# Patient Record
Sex: Female | Born: 1935
Health system: Southern US, Community
[De-identification: ages and names within clinical notes are randomized; demographics above are authoritative.]

## PROBLEM LIST (undated history)

## (undated) ENCOUNTER — Emergency Department (HOSPITAL_COMMUNITY)

## (undated) DIAGNOSIS — T7840XA Allergy, unspecified, initial encounter: Secondary | ICD-10-CM

## (undated) DIAGNOSIS — R7303 Prediabetes: Secondary | ICD-10-CM

## (undated) DIAGNOSIS — M1990 Unspecified osteoarthritis, unspecified site: Secondary | ICD-10-CM

## (undated) DIAGNOSIS — I639 Cerebral infarction, unspecified: Secondary | ICD-10-CM

## (undated) DIAGNOSIS — N289 Disorder of kidney and ureter, unspecified: Secondary | ICD-10-CM

## (undated) DIAGNOSIS — C189 Malignant neoplasm of colon, unspecified: Secondary | ICD-10-CM

## (undated) DIAGNOSIS — Z9289 Personal history of other medical treatment: Secondary | ICD-10-CM

## (undated) DIAGNOSIS — I2699 Other pulmonary embolism without acute cor pulmonale: Secondary | ICD-10-CM

## (undated) DIAGNOSIS — E785 Hyperlipidemia, unspecified: Secondary | ICD-10-CM

## (undated) DIAGNOSIS — R42 Dizziness and giddiness: Secondary | ICD-10-CM

## (undated) DIAGNOSIS — E039 Hypothyroidism, unspecified: Secondary | ICD-10-CM

## (undated) DIAGNOSIS — Z85038 Personal history of other malignant neoplasm of large intestine: Secondary | ICD-10-CM

## (undated) DIAGNOSIS — I10 Essential (primary) hypertension: Secondary | ICD-10-CM

## (undated) HISTORY — PX: ABDOMINAL HYSTERECTOMY: SHX81

## (undated) HISTORY — DX: Personal history of other malignant neoplasm of large intestine: Z85.038

## (undated) HISTORY — PX: PARTIAL THYMECTOMY: SHX2177

## (undated) HISTORY — PX: PARTIAL HYSTERECTOMY: SHX80

## (undated) HISTORY — DX: Hyperlipidemia, unspecified: E78.5

## (undated) HISTORY — DX: Essential (primary) hypertension: I10

## (undated) HISTORY — DX: Unspecified osteoarthritis, unspecified site: M19.90

## (undated) HISTORY — PX: OTHER SURGICAL HISTORY: SHX169

## (undated) HISTORY — DX: Hypothyroidism, unspecified: E03.9

## (undated) HISTORY — DX: Cerebral infarction, unspecified: I63.9

## (undated) HISTORY — DX: Dizziness and giddiness: R42

## (undated) HISTORY — DX: Allergy, unspecified, initial encounter: T78.40XA

---

## 2000-09-06 ENCOUNTER — Ambulatory Visit (HOSPITAL_COMMUNITY): Admission: RE | Admit: 2000-09-06 | Discharge: 2000-09-06 | Payer: Self-pay | Admitting: Family Medicine

## 2000-09-06 ENCOUNTER — Encounter: Payer: Self-pay | Admitting: Family Medicine

## 2000-09-25 ENCOUNTER — Other Ambulatory Visit: Admission: RE | Admit: 2000-09-25 | Discharge: 2000-09-25 | Payer: Self-pay | Admitting: Family Medicine

## 2000-10-02 ENCOUNTER — Ambulatory Visit (HOSPITAL_COMMUNITY): Admission: RE | Admit: 2000-10-02 | Discharge: 2000-10-02 | Payer: Self-pay | Admitting: Family Medicine

## 2000-10-02 ENCOUNTER — Encounter: Payer: Self-pay | Admitting: Family Medicine

## 2001-01-18 ENCOUNTER — Encounter: Payer: Self-pay | Admitting: Family Medicine

## 2001-01-18 ENCOUNTER — Ambulatory Visit (HOSPITAL_COMMUNITY): Admission: RE | Admit: 2001-01-18 | Discharge: 2001-01-18 | Payer: Self-pay | Admitting: Family Medicine

## 2001-09-07 ENCOUNTER — Encounter: Payer: Self-pay | Admitting: Family Medicine

## 2001-09-07 ENCOUNTER — Ambulatory Visit (HOSPITAL_COMMUNITY): Admission: RE | Admit: 2001-09-07 | Discharge: 2001-09-07 | Payer: Self-pay | Admitting: Family Medicine

## 2002-09-13 ENCOUNTER — Ambulatory Visit (HOSPITAL_COMMUNITY): Admission: RE | Admit: 2002-09-13 | Discharge: 2002-09-13 | Payer: Self-pay | Admitting: Family Medicine

## 2002-09-13 ENCOUNTER — Encounter: Payer: Self-pay | Admitting: Family Medicine

## 2003-01-13 ENCOUNTER — Ambulatory Visit (HOSPITAL_COMMUNITY): Admission: RE | Admit: 2003-01-13 | Discharge: 2003-01-13 | Payer: Self-pay | Admitting: Internal Medicine

## 2003-10-07 ENCOUNTER — Ambulatory Visit (HOSPITAL_COMMUNITY): Admission: RE | Admit: 2003-10-07 | Discharge: 2003-10-07 | Payer: Self-pay | Admitting: Family Medicine

## 2004-10-12 ENCOUNTER — Ambulatory Visit (HOSPITAL_COMMUNITY): Admission: RE | Admit: 2004-10-12 | Discharge: 2004-10-12 | Payer: Self-pay | Admitting: Family Medicine

## 2005-10-14 ENCOUNTER — Ambulatory Visit (HOSPITAL_COMMUNITY): Admission: RE | Admit: 2005-10-14 | Discharge: 2005-10-14 | Payer: Self-pay | Admitting: Family Medicine

## 2006-10-17 ENCOUNTER — Ambulatory Visit (HOSPITAL_COMMUNITY): Admission: RE | Admit: 2006-10-17 | Discharge: 2006-10-17 | Payer: Self-pay | Admitting: Family Medicine

## 2007-03-01 ENCOUNTER — Ambulatory Visit (HOSPITAL_COMMUNITY): Admission: RE | Admit: 2007-03-01 | Discharge: 2007-03-01 | Payer: Self-pay | Admitting: Family Medicine

## 2007-10-19 ENCOUNTER — Ambulatory Visit (HOSPITAL_COMMUNITY): Admission: RE | Admit: 2007-10-19 | Discharge: 2007-10-19 | Payer: Self-pay | Admitting: Family Medicine

## 2008-10-28 ENCOUNTER — Ambulatory Visit (HOSPITAL_COMMUNITY): Admission: RE | Admit: 2008-10-28 | Discharge: 2008-10-28 | Payer: Self-pay | Admitting: Family Medicine

## 2009-06-10 ENCOUNTER — Ambulatory Visit: Payer: Self-pay | Admitting: Gastroenterology

## 2009-06-10 ENCOUNTER — Encounter (INDEPENDENT_AMBULATORY_CARE_PROVIDER_SITE_OTHER): Payer: Self-pay

## 2009-06-10 DIAGNOSIS — K5900 Constipation, unspecified: Secondary | ICD-10-CM | POA: Insufficient documentation

## 2009-06-12 DIAGNOSIS — Z85038 Personal history of other malignant neoplasm of large intestine: Secondary | ICD-10-CM

## 2009-06-12 HISTORY — DX: Personal history of other malignant neoplasm of large intestine: Z85.038

## 2009-06-12 HISTORY — PX: COLONOSCOPY: SHX174

## 2009-06-29 ENCOUNTER — Ambulatory Visit: Payer: Self-pay | Admitting: Gastroenterology

## 2009-06-29 ENCOUNTER — Ambulatory Visit (HOSPITAL_COMMUNITY): Admission: RE | Admit: 2009-06-29 | Discharge: 2009-06-29 | Payer: Self-pay | Admitting: Gastroenterology

## 2009-06-30 ENCOUNTER — Encounter: Payer: Self-pay | Admitting: Gastroenterology

## 2009-07-01 ENCOUNTER — Encounter: Payer: Self-pay | Admitting: Gastroenterology

## 2009-07-01 ENCOUNTER — Ambulatory Visit (HOSPITAL_COMMUNITY): Admission: RE | Admit: 2009-07-01 | Discharge: 2009-07-01 | Payer: Self-pay | Admitting: Gastroenterology

## 2009-07-01 LAB — CONVERTED CEMR LAB
ALT: 9 units/L (ref 0–35)
AST: 13 units/L (ref 0–37)
Albumin: 4.3 g/dL (ref 3.5–5.2)
BUN: 18 mg/dL (ref 6–23)
Basophils Absolute: 0 10*3/uL (ref 0.0–0.1)
Basophils Relative: 0 % (ref 0–1)
CEA: 0.6 ng/mL (ref 0.0–5.0)
Calcium: 9.5 mg/dL (ref 8.4–10.5)
Eosinophils Relative: 1 % (ref 0–5)
Glucose, Bld: 105 mg/dL — ABNORMAL HIGH (ref 70–99)
HCT: 43.7 % (ref 36.0–46.0)
INR: 1.12 (ref <1.50)
MCV: 93.2 fL (ref 78.0–100.0)
Monocytes Absolute: 0.7 10*3/uL (ref 0.1–1.0)
Platelets: 369 10*3/uL (ref 150–400)
Potassium: 3.8 meq/L (ref 3.5–5.3)
Prothrombin Time: 14.3 s (ref 11.6–15.2)
Sodium: 140 meq/L (ref 135–145)

## 2009-07-02 ENCOUNTER — Encounter: Payer: Self-pay | Admitting: Gastroenterology

## 2009-07-12 HISTORY — PX: COLON SURGERY: SHX602

## 2009-07-31 ENCOUNTER — Telehealth (INDEPENDENT_AMBULATORY_CARE_PROVIDER_SITE_OTHER): Payer: Self-pay

## 2009-07-31 ENCOUNTER — Encounter (INDEPENDENT_AMBULATORY_CARE_PROVIDER_SITE_OTHER): Payer: Self-pay | Admitting: General Surgery

## 2009-07-31 ENCOUNTER — Inpatient Hospital Stay (HOSPITAL_COMMUNITY): Admission: RE | Admit: 2009-07-31 | Discharge: 2009-08-05 | Payer: Self-pay | Admitting: General Surgery

## 2009-08-20 ENCOUNTER — Ambulatory Visit: Payer: Self-pay | Admitting: Hematology and Oncology

## 2009-08-26 LAB — CBC WITH DIFFERENTIAL/PLATELET
BASO%: 0.4 % (ref 0.0–2.0)
Basophils Absolute: 0 10*3/uL (ref 0.0–0.1)
EOS%: 2.8 % (ref 0.0–7.0)
HCT: 39.1 % (ref 34.8–46.6)
MCH: 30.1 pg (ref 25.1–34.0)
MCHC: 32.7 g/dL (ref 31.5–36.0)
NEUT#: 2.6 10*3/uL (ref 1.5–6.5)
NEUT%: 53.2 % (ref 38.4–76.8)
WBC: 4.9 10*3/uL (ref 3.9–10.3)

## 2009-08-26 LAB — COMPREHENSIVE METABOLIC PANEL
Albumin: 4.3 g/dL (ref 3.5–5.2)
Alkaline Phosphatase: 68 U/L (ref 39–117)
Calcium: 9.6 mg/dL (ref 8.4–10.5)
Chloride: 104 mEq/L (ref 96–112)
Glucose, Bld: 100 mg/dL — ABNORMAL HIGH (ref 70–99)
Sodium: 142 mEq/L (ref 135–145)
Total Bilirubin: 0.6 mg/dL (ref 0.3–1.2)

## 2009-08-26 LAB — CEA: CEA: 0.8 ng/mL (ref 0.0–5.0)

## 2009-09-08 ENCOUNTER — Ambulatory Visit (HOSPITAL_COMMUNITY): Admission: RE | Admit: 2009-09-08 | Discharge: 2009-09-08 | Payer: Self-pay | Admitting: Hematology and Oncology

## 2009-10-26 ENCOUNTER — Encounter: Payer: Self-pay | Admitting: Gastroenterology

## 2009-10-30 ENCOUNTER — Ambulatory Visit (HOSPITAL_COMMUNITY): Admission: RE | Admit: 2009-10-30 | Discharge: 2009-10-30 | Payer: Self-pay | Admitting: Family Medicine

## 2010-02-18 ENCOUNTER — Ambulatory Visit: Payer: Self-pay | Admitting: Hematology and Oncology

## 2010-02-22 LAB — COMPREHENSIVE METABOLIC PANEL
ALT: 9 U/L (ref 0–35)
AST: 14 U/L (ref 0–37)
BUN: 12 mg/dL (ref 6–23)
Calcium: 9.9 mg/dL (ref 8.4–10.5)
Creatinine, Ser: 1.04 mg/dL (ref 0.40–1.20)
Glucose, Bld: 86 mg/dL (ref 70–99)
Total Bilirubin: 0.6 mg/dL (ref 0.3–1.2)

## 2010-02-22 LAB — CEA: CEA: <0.5 ng/mL (ref 0.0–5.0)

## 2010-02-22 LAB — CBC WITH DIFFERENTIAL/PLATELET
Basophils Absolute: 0 10*3/uL (ref 0.0–0.1)
EOS%: 2.1 % (ref 0.0–7.0)
Eosinophils Absolute: 0.1 10*3/uL (ref 0.0–0.5)
HCT: 42.1 % (ref 34.8–46.6)
HGB: 14 g/dL (ref 11.6–15.9)
MCV: 91.3 fL (ref 79.5–101.0)
MONO#: 0.5 10*3/uL (ref 0.1–0.9)
MONO%: 6.9 % (ref 0.0–14.0)
NEUT%: 48.2 % (ref 38.4–76.8)
Platelets: 379 10*3/uL (ref 145–400)
RBC: 4.61 10*6/uL (ref 3.70–5.45)
RDW: 15.1 % — ABNORMAL HIGH (ref 11.2–14.5)
lymph#: 2.8 10*3/uL (ref 0.9–3.3)

## 2010-03-22 ENCOUNTER — Encounter: Payer: Self-pay | Admitting: Internal Medicine

## 2010-04-03 ENCOUNTER — Other Ambulatory Visit: Payer: Self-pay | Admitting: Hematology and Oncology

## 2010-04-03 DIAGNOSIS — C189 Malignant neoplasm of colon, unspecified: Secondary | ICD-10-CM

## 2010-04-15 NOTE — Miscellaneous (Signed)
Summary: Colonoscopy  01/13/2003    Clinical Lists Changes    NAME:  [name], [name]                  ACCOUNT NO.:  [id number]   MEDICAL RECORD NO.:  [id number]                   PATIENT TYPE:  AMB   LOCATION:  DAY                                  FACILITY:  [facility]   PHYSICIAN:  Lionel December, M.D.                 DATE OF BIRTH:  06/18/1934   DATE OF PROCEDURE:  01/13/2003  DATE OF DISCHARGE:                                 OPERATIVE REPORT   PROCEDURE:  Total colonoscopy.   INDICATIONS:  [name] is a 76 year old African-American female who is  undergoing screening colonoscopy. Family history is positive for colon  carcinoma in her mother.   The procedure and risks were reviewed with the patient and informed consent  was obtained.   PREMEDICATIONS:  Demerol 50 mg IV, Versed 6 mg IV in divided doses.  tem.   FINDINGS:  The procedure performed in endoscopy suite.  The patient's vital  signs and O2 saturations were monitored during the procedure and remained  stable.  The patient was placed in the left lateral position and rectal  examination was performed.  No abnormalities were noted on external or  digital exam.  The Olympus video scope was placed in the rectum and advanced  under direct vision into the sigmoid colon and beyond.  The preparation was  excellent.  The scope was passed into the cecum which was identified by the  appendiceal orifice and ileocecal valve.  Pictures were taken for the  record.  As the scope was withdrawn, the colonic mucosa was once again  carefully examined and was normal throughout.  The rectal mucosa similarly  was normal.  The scope was retroflexed to examine the anorectal junction  which was unremarkable.  The endoscope was straightened and withdrawn.  The  patient tolerated the procedure well.   FINAL DIAGNOSES:  Normal colonoscopy.   RECOMMENDATIONS:  Yearly Hemoccults and she should return for next screening  exam in five years from  now.      ___________________________________________                                            Lionel December, M.D.   NR/MEDQ  D:  01/13/2003  T:  01/13/2003  Job:  [id number]   cc:   Angus G. Renard Matter, M.D.  [address]  [place]  [place] [address]  Fax: [phone]

## 2010-04-15 NOTE — Progress Notes (Signed)
Summary: REQUEST FROM LAB/NEED DIFFERENT DX CODE  Phone Note Other Incoming   Summary of Call: VM FROM [facility] AT [facility]. PT HAD CEA AND PT ON 06/30/09 AND A DIFFENENT DIAGNOSIS  CODE IS NEEDED. Initial call taken by: Cloria Spring LPN,  Jul 31, 2009 2:10 PM     Appended Document: REQUEST FROM LAB/NEED DIFFERENT DX CODE Dx: Colon Cancer  Appended Document: REQUEST FROM LAB/NEED DIFFERENT DX CODE LEft the above information on VM for [facility].

## 2010-04-15 NOTE — Letter (Signed)
Summary: CT SCAN ORDER  CT SCAN ORDER   Imported By: Ave Filter 06/30/2009 16:44:51  _____________________________________________________________________  External Attachment:    Type:   Image     Comment:   External Document

## 2010-04-15 NOTE — Miscellaneous (Signed)
Summary: Orders Update  Clinical Lists Changes  Orders: Added new Test order of T-Comprehensive Metabolic Panel [phone]) - Signed Added new Test order of T-CBC w/Diff ([id number]) - Signed Added new Test order of T-PT (Prothrombin Time) ([id number]) - Signed Added new Test order of T-CEA ([id number]) - Signed

## 2010-04-15 NOTE — Letter (Signed)
Summary: TCS ORDER  TCS ORDER   Imported By: Ave Filter 06/10/2009 14:12:03  _____________________________________________________________________  External Attachment:    Type:   Image     Comment:   External Document

## 2010-04-15 NOTE — Assessment & Plan Note (Signed)
Summary: BLOOD IN STOOL ,GU   Visit Type:  Initial Visit Primary Care Provider:  McInnis  Chief Complaint:  blood in stool.  History of Present Illness: [name] is a plesant 76 y/o female, who presents for further evaluation of brbpr. She had single episode noted with straining recently. She has intermittent constipation ususally controlled with high fiber diet. Her last TCS was by Dr. Karilyn Cota in 11/04 and was normal. She has FH of CRC, mother. She denies abdominal pain, melena, diarrhea, heartburn, n/v, weight loss. She has gained a few pounds this winter. She is excercizing.     Current Medications (verified): 1)  Synthroid 75 Mcg Tabs (Levothyroxine Sodium) .... Take 1 Tablet By Mouth Once A Day 2)  Crestor 20 Mg Tabs (Rosuvastatin Calcium) .... Take 1 Tablet By Mouth Once A Day 3)  Amlodipine Besylate 5 Mg Tabs (Amlodipine Besylate) .... Take 1 Tablet By Mouth Once A Day 4)  Evista 60 Mg Tabs (Raloxifene Hcl) .... Take 1 Tablet By Mouth Once A Day 5)  Tylenol .... As Needed  Allergies (verified): No Known Drug Allergies  Past History:  Past Medical History: Colonoscopy, 01/2003 --> normal Hyperlipidemia Hypertension Hypothyroidism  Past Surgical History: Partial thyroidectomy, benign tumors Partial hysterectomy  Family History: Mother, colon cancer, greater than age 79s, heart dz Father, Deceased, MI No FH liver disease  Social History: Married. Two children. 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 Used to work in Designer, fashion/clothing. Never smoked. No alcohol or drug use.  Review of Systems General:  Denies fever, chills, sweats, anorexia, fatigue, weakness, and weight loss. Eyes:  Denies vision loss. ENT:  Denies nasal congestion, sore throat, hoarseness, and difficulty swallowing. CV:  Denies chest pains, angina, and palpitations. Resp:  Denies dyspnea at rest, dyspnea with exercise, cough, and sputum. GI:  See HPI. GU:  Denies urinary burning and blood in urine. MS:  Denies joint  pain / LOM. Derm:  Denies rash and itching. Neuro:  Denies weakness, paralysis, frequent headaches, memory loss, and confusion. Psych:  Denies depression and anxiety. Endo:  Denies unusual weight change. Heme:  Denies bruising and bleeding. Allergy:  Denies hives and rash.  Vital Signs:  Patient profile:   76 year old female Height:      64 inches Weight:      177 pounds BMI:     30.49 Temp:     98.4 degrees F oral Pulse rate:   96 / minute BP sitting:   142 / 80  (left arm) Cuff size:   large  Vitals Entered By: Cloria Spring LPN (June 10, 2009 1:23 PM)  Physical Exam  General:  Well developed, well nourished, no acute distress. Head:  Normocephalic and atraumatic. Eyes:  Conjunctivae pink, no scleral icterus.  Mouth:  Oropharyngeal mucosa moist, pink.  No lesions, erythema or exudate.    Neck:  Supple; no masses or thyromegaly. Lungs:  Clear throughout to auscultation. Heart:  Regular rate and rhythm; no murmurs, rubs,  or bruits. Abdomen:  Bowel sounds normal.  Abdomen is soft, nontender, nondistended.  No rebound or guarding.  No hepatosplenomegaly, masses or hernias.  No abdominal bruits.  Rectal:  deferred until time of colonoscopy.   Extremities:  No clubbing, cyanosis, edema or deformities noted. Neurologic:  Alert and  oriented x4;  grossly normal neurologically. Skin:  Intact without significant lesions or rashes. Cervical Nodes:  No significant cervical adenopathy. Psych:  Alert and cooperative. Normal mood and affect.  Impression & Recommendations:  Problem # 1:  RECTAL BLEEDING (ICD-569.3)  Brbpr with recent constipation. Likely benign anorectal source. Patient is overdue for colonoscopy (FH of CRC 1st degree relative). Colonoscopy to be performed in near future.  Risks, alternatives, and benefits including but not limited to the risk of reaction to medication, bleeding, infection, and perforation were addressed.  Patient voiced understanding and provided  verbal consent.   For constipation, she will continue high fiber diet. Drink plenty of fluids. May add Colace or Miralax daily as needed.  Orders: New Patient Level III [phone])

## 2010-04-15 NOTE — Letter (Signed)
Summary: OFFICE NOTE FROM DR ODOGWU  OFFICE NOTE FROM DR ODOGWU   Imported By: Rexene Alberts 10/26/2009 14:04:44  _____________________________________________________________________  External Attachment:    Type:   Image     Comment:   External Document

## 2010-04-15 NOTE — Letter (Signed)
Summary: OFFICE PROGRESS NOTE  OFFICE PROGRESS NOTE   Imported By: Rexene Alberts 03/22/2010 10:42:49  _____________________________________________________________________  External Attachment:    Type:   Image     Comment:   External Document

## 2010-04-15 NOTE — Letter (Signed)
Summary: SURGICAL REFERRAL  SURGICAL REFERRAL   Imported By: Ave Filter 07/02/2009 15:32:25  _____________________________________________________________________  External Attachment:    Type:   Image     Comment:   External Document

## 2010-05-31 LAB — CBC
Hemoglobin: 11.6 g/dL — ABNORMAL LOW (ref 12.0–15.0)
Hemoglobin: 14.3 g/dL (ref 12.0–15.0)
MCHC: 33.3 g/dL (ref 30.0–36.0)
MCV: 94.7 fL (ref 78.0–100.0)
Platelets: 254 10*3/uL (ref 150–400)
Platelets: 329 10*3/uL (ref 150–400)
RDW: 14.8 % (ref 11.5–15.5)

## 2010-05-31 LAB — DIFFERENTIAL
Basophils Relative: 0 % (ref 0–1)
Eosinophils Relative: 3 % (ref 0–5)
Lymphs Abs: 1.5 10*3/uL (ref 0.7–4.0)
Monocytes Relative: 2 % — ABNORMAL LOW (ref 3–12)
Neutro Abs: 4 10*3/uL (ref 1.7–7.7)
Neutrophils Relative %: 69 % (ref 43–77)

## 2010-05-31 LAB — BASIC METABOLIC PANEL
BUN: 8 mg/dL (ref 6–23)
BUN: 9 mg/dL (ref 6–23)
CO2: 28 mEq/L (ref 19–32)
Chloride: 106 mEq/L (ref 96–112)
Creatinine, Ser: 1.13 mg/dL (ref 0.4–1.2)
GFR calc non Af Amer: 47 mL/min — ABNORMAL LOW (ref >60)
GFR calc non Af Amer: 48 mL/min — ABNORMAL LOW (ref >60)
Glucose, Bld: 112 mg/dL — ABNORMAL HIGH (ref 70–99)
Potassium: 4.4 mEq/L (ref 3.5–5.1)
Sodium: 140 mEq/L (ref 135–145)

## 2010-06-29 ENCOUNTER — Encounter: Payer: Self-pay | Admitting: Internal Medicine

## 2010-06-29 ENCOUNTER — Ambulatory Visit (INDEPENDENT_AMBULATORY_CARE_PROVIDER_SITE_OTHER): Payer: Medicare Other | Admitting: Gastroenterology

## 2010-06-29 ENCOUNTER — Encounter: Payer: Self-pay | Admitting: Gastroenterology

## 2010-06-29 VITALS — BP 157/86 | HR 87 | Temp 97.5°F | Ht 64.0 in | Wt 165.4 lb

## 2010-06-29 DIAGNOSIS — K5900 Constipation, unspecified: Secondary | ICD-10-CM

## 2010-06-29 DIAGNOSIS — Z85038 Personal history of other malignant neoplasm of large intestine: Secondary | ICD-10-CM

## 2010-06-29 MED ORDER — PEG 3350-KCL-NA BICARB-NACL 420 G PO SOLR: ORAL | Status: AC

## 2010-06-29 NOTE — Assessment & Plan Note (Signed)
Adenocarcinoma of the colon, diagnosed in April of 2011. She is doing very well. She is due for her one-year followup surveillance colonoscopy. She recalls waking up at time of her colonoscopy last time and requests more sedation. She received Versed 4 mg, Demerol 50 mg last time. Suspect with increased dose she will have an adequate sedation and will not need to have deep sedation in the OR. I have discussed the risks, alternatives, benefits with regards to but not limited to the risk of reaction to medication, bleeding, infection, perforation and the patient is agreeable to proceed. Written consent to be obtained.

## 2010-06-29 NOTE — Progress Notes (Signed)
    BM doing okay, Miralax prn. No melena, brbpr. No abd pain. Good appetite. Gained some weight back. No heartburn, n/v. Goes back to see oncologist in six months.

## 2010-06-29 NOTE — Assessment & Plan Note (Signed)
Continue MiraLax when necessary care

## 2010-06-29 NOTE — Progress Notes (Signed)
Primary Care Physician:  Alice Reichert, MD  Primary Gastroenterologist:  Dr. Jonette Eva  Chief Complaint  Patient presents with  . Pre-op Exam    consult for repeat colonoscopy, found cancer last year during procedure    HPI:  [name] is a 76 y.o. female here for followup of colon cancer. She was diagnosed with colon cancer in April of 2011 at time of colonoscopy. She was found to have a 1.2 cm sessile cecal polyp which had adenocarcinoma arising in a tubular adenoma. She subsequently underwent a right hemicolectomy by Dr. Bertram Savin in May 2011. There was no evidence of residual adenocarcinoma on the pathology. Patient did not require adjuvant therapy. She has done really well. Her bowel movements are regular. She has to use MiraLax only as needed. Denies any melena, rectal bleeding, abdominal pain, heartburn, vomiting. She lost about 20 pounds around the time of her surgery but has gained 10 pounds back.  Current Outpatient Prescriptions  Medication Sig Dispense Refill  . amLODipine (NORVASC) 5 MG tablet Take 1 tablet by mouth daily.      . raloxifene (EVISTA) 60 MG tablet Take 60 mg by mouth daily.        . rosuvastatin (CRESTOR) 20 MG tablet Take 20 mg by mouth daily.        Marland Kitchen SYNTHROID 75 MCG tablet Take 1 tablet by mouth daily.      Wallene Dales ophthalmic ointment Apply 1 application topically Twice daily.      . polyethylene glycol-electrolytes (TRILYTE) 420 G solution Use as directed Also buy 1 fleet enema & 4 dulcolax tablets to use as directed  4000 mL  0  . RESTASIS 0.05 % ophthalmic emulsion Place 1 drop into both eyes 2 (two) times daily.         Allergies as of 06/29/2010  . (No Known Allergies)    Past Medical History  Diagnosis Date  . Hyperlipidemia   . HTN (hypertension)   . Hypothyroidism   . History of colon cancer 06/2009    found at time of TCS 06/29/09, 1.2cm sessile cecal polyp, no adjuvent therapy needed    Past Surgical History    Procedure Date  . Colon surgery 07/2009    right hemicolectomy, no residual colon cancer on path  . Partial thyroidectomy     benign tumors  . Partial hysterectomy     Family History  Problem Relation Age of Onset  . Colon cancer Mother     >age60  . Heart attack Father   . Liver disease Neg Hx     History   Social History  . Marital Status: Married    Spouse Name: N/A    Number of Children: 2  . Years of Education: N/A   Occupational History  . volunteer at [employer]   . retired from Designer, fashion/clothing    Social History Main Topics  . Smoking status: Never Smoker   . Smokeless tobacco: Never Used  . Alcohol Use: No  . Drug Use: No  . Sexually Active: No      ROS:  General: Negative for anorexia, weight loss, fever, chills, fatigue, weakness. Eyes: Negative for vision changes.  ENT: Negative for hoarseness, difficulty swallowing , nasal congestion. CV: Negative for chest pain, angina, palpitations, dyspnea on exertion, peripheral edema.  Respiratory: Negative for dyspnea at rest, dyspnea on exertion, cough, sputum, wheezing.  GI: See history of present illness. GU:  Negative for dysuria, hematuria, urinary incontinence, urinary frequency, nocturnal  urination.  MS: Negative for joint pain, low back pain.  Derm: Negative for rash or itching.  Neuro: Negative for weakness, abnormal sensation, seizure, frequent headaches, memory loss, confusion.  Psych: Negative for anxiety, depression, suicidal ideation, hallucinations.  Endo: Negative for unusual weight change.  Heme: Negative for bruising or bleeding. Allergy: Negative for rash or hives.    Physical Examination:  BP 157/86  Pulse 87  Temp 97.5 F (36.4 C)  Ht 5\' 4"  (1.626 m)  Wt 165 lb 6.4 oz (75.025 kg)  BMI 28.39 kg/m2   General: Well-nourished, well-developed in no acute distress.  Head: Normocephalic, atraumatic.   Eyes: Conjunctiva pink, no icterus. Mouth: Oropharyngeal mucosa moist and pink , no lesions  erythema or exudate. Neck: Supple without thyromegaly, masses, or lymphadenopathy.  Lungs: Clear to auscultation bilaterally.  Heart: Regular rate and rhythm, no murmurs rubs or gallops.  Abdomen: Bowel sounds are normal, nontender, nondistended, no hepatosplenomegaly or masses, no abdominal bruits or    hernia , no rebound or guarding.   Extremities: No lower extremity edema.  Neuro: Alert and oriented x 4 , grossly normal neurologically.  Skin: Warm and dry, no rash or jaundice.   Psych: Alert and cooperative, normal mood and affect.

## 2010-07-16 ENCOUNTER — Ambulatory Visit (HOSPITAL_COMMUNITY)
Admission: RE | Admit: 2010-07-16 | Discharge: 2010-07-16 | Disposition: A | Discharge status: Home / Self Care | Payer: Medicare Other | Source: Ambulatory Visit | Attending: Gastroenterology | Admitting: Gastroenterology

## 2010-07-16 ENCOUNTER — Encounter: Payer: Medicare Other | Admitting: Gastroenterology

## 2010-07-16 ENCOUNTER — Other Ambulatory Visit: Payer: Self-pay | Admitting: Gastroenterology

## 2010-07-16 DIAGNOSIS — D128 Benign neoplasm of rectum: Secondary | ICD-10-CM | POA: Insufficient documentation

## 2010-07-16 DIAGNOSIS — K5730 Diverticulosis of large intestine without perforation or abscess without bleeding: Secondary | ICD-10-CM | POA: Insufficient documentation

## 2010-07-16 DIAGNOSIS — Z09 Encounter for follow-up examination after completed treatment for conditions other than malignant neoplasm: Secondary | ICD-10-CM

## 2010-07-16 DIAGNOSIS — Z79899 Other long term (current) drug therapy: Secondary | ICD-10-CM | POA: Insufficient documentation

## 2010-07-16 DIAGNOSIS — D126 Benign neoplasm of colon, unspecified: Secondary | ICD-10-CM

## 2010-07-16 DIAGNOSIS — Z85048 Personal history of other malignant neoplasm of rectum, rectosigmoid junction, and anus: Secondary | ICD-10-CM

## 2010-07-16 DIAGNOSIS — D129 Benign neoplasm of anus and anal canal: Secondary | ICD-10-CM

## 2010-07-16 DIAGNOSIS — I10 Essential (primary) hypertension: Secondary | ICD-10-CM | POA: Insufficient documentation

## 2010-07-16 HISTORY — PX: COLONOSCOPY: SHX174

## 2010-07-30 NOTE — Op Note (Signed)
   NAME:  [name], [name]                  ACCOUNT NO.:  [id number]   MEDICAL RECORD NO.:  [id number]                   PATIENT TYPE:  AMB   LOCATION:  DAY                                  FACILITY:  [facility]   PHYSICIAN:  Lionel December, M.D.                 DATE OF BIRTH:  1934/07/14   DATE OF PROCEDURE:  01/13/2003  DATE OF DISCHARGE:                                 OPERATIVE REPORT   PROCEDURE:  Total colonoscopy.   INDICATIONS:  Quanna is a 76 year old African-American female who is  undergoing screening colonoscopy. Family history is positive for colon  carcinoma in her mother.   The procedure and risks were reviewed with the patient and informed consent  was obtained.   PREMEDICATIONS:  Demerol 50 mg IV, Versed 6 mg IV in divided doses.  tem.   FINDINGS:  The procedure performed in endoscopy suite.  The patient's vital  signs and O2 saturations were monitored during the procedure and remained  stable.  The patient was placed in the left lateral position and rectal  examination was performed.  No abnormalities were noted on external or  digital exam.  The Olympus video scope was placed in the rectum and advanced  under direct vision into the sigmoid colon and beyond.  The preparation was  excellent.  The scope was passed into the cecum which was identified by the  appendiceal orifice and ileocecal valve.  Pictures were taken for the  record.  As the scope was withdrawn, the colonic mucosa was once again  carefully examined and was normal throughout.  The rectal mucosa similarly  was normal.  The scope was retroflexed to examine the anorectal junction  which was unremarkable.  The endoscope was straightened and withdrawn.  The  patient tolerated the procedure well.   FINAL DIAGNOSES:  Normal colonoscopy.   RECOMMENDATIONS:  Yearly Hemoccults and she should return for next screening  exam in five years from now.      ___________________________________________                                        Lionel December, M.D.   NR/MEDQ  D:  01/13/2003  T:  01/13/2003  Job:  [id number]   cc:   Angus G. Renard Matter, M.D.  [address]  [place]  [place] [address]  Fax: [phone]

## 2010-08-10 NOTE — Op Note (Signed)
  NAMEWINDA, Nicole        ACCOUNT NO.:  [id number]  MEDICAL RECORD NO.:  [id number]           PATIENT TYPE:  O  LOCATION:  DAYP                          FACILITY:  [facility]  PHYSICIAN:  Jonette Eva, M.D.     DATE OF BIRTH:  [birth year]  DATE OF PROCEDURE:  07/16/2010 DATE OF DISCHARGE:                              OPERATIVE REPORT   REFERRING PHYSICIAN:  Angus G. McInnis, MD  PROCEDURE:  Colonoscopy with cold forceps and polypectomy.  INDICATION FOR EXAM:  [name] is a 76 year old female who presented in April 2011 with change in her bowel habits and bright red blood per rectum.  She was found to have a 1.2-cm sessile cecal polyp which was removed piecemeal.  She also had 2-4 rectal polyps removed. The pathology revealed adenocarcinoma arising in a tubular adenoma and she had several hyperplastic rectal polyps.  In May 2011, she had a right hemicolectomy.  The pathology showed no residual adenocarcinoma and 25 benign pericolonic lymph nodes.  The margins tumors were also negative for tumor.  She denies any diarrhea, bright red blood per rectum, change in bowel habits, or weight loss.  FINDINGS: 1. Normal neoterminal ileum. 2. Normal anastomosis. 3. Two 3-mm sessile polypoid-appearing lesions removed via cold     forceps in the descending colon.  Two 3-mm sessile rectal polyps     removed via cold forceps. 4. Frequent sigmoid colon diverticula.  Otherwise, no masses,     inflammatory changes, or AVMs seen. 5. Normal retroflex view of the rectum.  RECOMMENDATIONS: 1. Screening colonoscopy in 3 years. 2. Will call with results of her polypectomy. 3. No aspirin, NSAIDS, or anticoagulation for 3 days. 4. She should follow a high-fiber diet. 5. She is given a handout on high-fiber diet, polyps, and     diverticulosis.  MEDICATIONS: 1. Demerol 75 mg IV. 2. Versed 5 mg IV.  PROCEDURE TECHNIQUE:  Physical exam was performed.  Informed consent was obtained  from the patient explaining the benefits, risks, and alternatives to the procedure.  The patient was connected to monitor and placed in left lateral position.  Continuous oxygen was provided by nasal cannula and IV medicine administered through an indwelling cannula.  After ministration of sedation and rectal exam, the patient's rectum was intubated and scope advanced under direct visualization to the neoterminal ileum.  Scope was removed slowly by careful examining the color, texture, anatomy, and integrity of mucosa on the way out.  The patient was recovered in Endoscopy and discharged home in satisfactory condition.  PATH: HYPERPLASTIC POLYP-TCS 3 YEARS.   Jonette Eva, M.D.     SF/MEDQ  D:  07/16/2010  T:  07/17/2010  Job:  [id number]  Electronically Signed by Jonette Eva M.D. on 08/10/2010 03:18:22 PM

## 2010-08-27 ENCOUNTER — Other Ambulatory Visit: Payer: Self-pay | Admitting: Hematology and Oncology

## 2010-08-27 ENCOUNTER — Ambulatory Visit (HOSPITAL_COMMUNITY)
Admission: RE | Admit: 2010-08-27 | Discharge: 2010-08-27 | Disposition: A | Discharge status: Home / Self Care | Payer: Medicare Other | Source: Ambulatory Visit | Attending: Hematology and Oncology | Admitting: Hematology and Oncology

## 2010-08-27 ENCOUNTER — Encounter (HOSPITAL_BASED_OUTPATIENT_CLINIC_OR_DEPARTMENT_OTHER): Payer: Medicare Other | Admitting: Hematology and Oncology

## 2010-08-27 ENCOUNTER — Encounter (HOSPITAL_COMMUNITY): Payer: Self-pay

## 2010-08-27 DIAGNOSIS — C189 Malignant neoplasm of colon, unspecified: Secondary | ICD-10-CM

## 2010-08-27 DIAGNOSIS — J984 Other disorders of lung: Secondary | ICD-10-CM | POA: Insufficient documentation

## 2010-08-27 DIAGNOSIS — E041 Nontoxic single thyroid nodule: Secondary | ICD-10-CM | POA: Insufficient documentation

## 2010-08-27 DIAGNOSIS — Z9049 Acquired absence of other specified parts of digestive tract: Secondary | ICD-10-CM | POA: Insufficient documentation

## 2010-08-27 DIAGNOSIS — N281 Cyst of kidney, acquired: Secondary | ICD-10-CM | POA: Insufficient documentation

## 2010-08-27 HISTORY — DX: Malignant neoplasm of colon, unspecified: C18.9

## 2010-08-27 LAB — CMP (CANCER CENTER ONLY)
BUN, Bld: 13 mg/dL (ref 7–22)
CO2: 31 mEq/L (ref 18–33)
Calcium: 9.5 mg/dL (ref 8.0–10.3)
Chloride: 99 mEq/L (ref 98–108)
Creat: 1.2 mg/dl (ref 0.6–1.2)
Glucose, Bld: 107 mg/dL (ref 73–118)
Total Bilirubin: 0.8 mg/dl (ref 0.20–1.60)

## 2010-08-27 LAB — CBC WITH DIFFERENTIAL/PLATELET
Basophils Absolute: 0 10*3/uL (ref 0.0–0.1)
Eosinophils Absolute: 0.2 10*3/uL (ref 0.0–0.5)
HCT: 43.1 % (ref 34.8–46.6)
HGB: 14.4 g/dL (ref 11.6–15.9)
LYMPH%: 33 % (ref 14.0–49.7)
MCHC: 33.5 g/dL (ref 31.5–36.0)
MONO#: 0.3 10*3/uL (ref 0.1–0.9)
NEUT#: 3.8 10*3/uL (ref 1.5–6.5)
NEUT%: 59.2 % (ref 38.4–76.8)
Platelets: 330 10*3/uL (ref 145–400)
WBC: 6.4 10*3/uL (ref 3.9–10.3)

## 2010-08-27 LAB — CEA: CEA: <0.5 ng/mL (ref 0.0–5.0)

## 2010-08-27 MED ORDER — IOHEXOL 300 MG/ML  SOLN
100.0000 mL | Freq: Once | INTRAMUSCULAR | Status: AC | PRN
  Administered 2010-08-27: 100 mL via INTRAVENOUS

## 2010-09-01 ENCOUNTER — Encounter (HOSPITAL_BASED_OUTPATIENT_CLINIC_OR_DEPARTMENT_OTHER): Payer: Medicare Other | Admitting: Hematology and Oncology

## 2010-09-01 DIAGNOSIS — C189 Malignant neoplasm of colon, unspecified: Secondary | ICD-10-CM

## 2010-10-11 ENCOUNTER — Other Ambulatory Visit (HOSPITAL_COMMUNITY): Payer: Self-pay | Admitting: Family Medicine

## 2010-10-11 ENCOUNTER — Ambulatory Visit (HOSPITAL_COMMUNITY)
Admission: RE | Admit: 2010-10-11 | Discharge: 2010-10-11 | Disposition: A | Discharge status: Home / Self Care | Payer: Medicare Other | Source: Ambulatory Visit | Attending: Family Medicine | Admitting: Family Medicine

## 2010-10-11 DIAGNOSIS — R52 Pain, unspecified: Secondary | ICD-10-CM

## 2010-10-11 DIAGNOSIS — IMO0002 Reserved for concepts with insufficient information to code with codable children: Secondary | ICD-10-CM | POA: Insufficient documentation

## 2010-10-11 DIAGNOSIS — M1710 Unilateral primary osteoarthritis, unspecified knee: Secondary | ICD-10-CM | POA: Insufficient documentation

## 2010-10-11 DIAGNOSIS — M899 Disorder of bone, unspecified: Secondary | ICD-10-CM | POA: Insufficient documentation

## 2010-10-11 DIAGNOSIS — M25569 Pain in unspecified knee: Secondary | ICD-10-CM | POA: Insufficient documentation

## 2010-11-05 ENCOUNTER — Other Ambulatory Visit (HOSPITAL_COMMUNITY): Payer: Self-pay | Admitting: Family Medicine

## 2010-11-05 DIAGNOSIS — Z139 Encounter for screening, unspecified: Secondary | ICD-10-CM

## 2010-11-08 ENCOUNTER — Ambulatory Visit (HOSPITAL_COMMUNITY)
Admission: RE | Admit: 2010-11-08 | Discharge: 2010-11-08 | Disposition: A | Discharge status: Home / Self Care | Payer: Medicare Other | Source: Ambulatory Visit | Attending: Family Medicine | Admitting: Family Medicine

## 2010-11-08 DIAGNOSIS — Z139 Encounter for screening, unspecified: Secondary | ICD-10-CM

## 2010-11-08 DIAGNOSIS — Z1231 Encounter for screening mammogram for malignant neoplasm of breast: Secondary | ICD-10-CM | POA: Insufficient documentation

## 2011-02-18 ENCOUNTER — Other Ambulatory Visit: Payer: Self-pay | Admitting: Hematology and Oncology

## 2011-02-18 ENCOUNTER — Other Ambulatory Visit (HOSPITAL_BASED_OUTPATIENT_CLINIC_OR_DEPARTMENT_OTHER): Payer: Medicare Other | Admitting: Lab

## 2011-02-18 DIAGNOSIS — C189 Malignant neoplasm of colon, unspecified: Secondary | ICD-10-CM

## 2011-02-18 LAB — CBC WITH DIFFERENTIAL/PLATELET
Eosinophils Absolute: 0.1 10*3/uL (ref 0.0–0.5)
HGB: 13.8 g/dL (ref 11.6–15.9)
LYMPH%: 39.1 % (ref 14.0–49.7)
MCHC: 32.8 g/dL (ref 31.5–36.0)
MCV: 92.5 fL (ref 79.5–101.0)
MONO#: 0.4 10*3/uL (ref 0.1–0.9)
MONO%: 7.2 % (ref 0.0–14.0)
NEUT#: 2.8 10*3/uL (ref 1.5–6.5)
NEUT%: 50.7 % (ref 38.4–76.8)
Platelets: 365 10*3/uL (ref 145–400)
RBC: 4.55 10*6/uL (ref 3.70–5.45)
WBC: 5.5 10*3/uL (ref 3.9–10.3)

## 2011-02-18 LAB — COMPREHENSIVE METABOLIC PANEL
AST: 15 U/L (ref 0–37)
Albumin: 4.3 g/dL (ref 3.5–5.2)
Alkaline Phosphatase: 79 U/L (ref 39–117)
BUN: 19 mg/dL (ref 6–23)
Creatinine, Ser: 1.07 mg/dL (ref 0.50–1.10)
Glucose, Bld: 81 mg/dL (ref 70–99)
Potassium: 4.1 mEq/L (ref 3.5–5.3)
Total Bilirubin: 0.5 mg/dL (ref 0.3–1.2)

## 2011-02-23 ENCOUNTER — Telehealth: Payer: Self-pay | Admitting: Hematology and Oncology

## 2011-02-23 NOTE — Telephone Encounter (Signed)
S/w pt, gave appt 03/31/11 @ 11.30am.

## 2011-02-25 ENCOUNTER — Other Ambulatory Visit: Payer: Self-pay | Admitting: *Deleted

## 2011-02-25 DIAGNOSIS — Z85038 Personal history of other malignant neoplasm of large intestine: Secondary | ICD-10-CM

## 2011-02-28 ENCOUNTER — Other Ambulatory Visit: Payer: Self-pay | Admitting: Physician Assistant

## 2011-02-28 ENCOUNTER — Ambulatory Visit (HOSPITAL_BASED_OUTPATIENT_CLINIC_OR_DEPARTMENT_OTHER): Payer: Medicare Other | Admitting: Physician Assistant

## 2011-02-28 ENCOUNTER — Other Ambulatory Visit (HOSPITAL_BASED_OUTPATIENT_CLINIC_OR_DEPARTMENT_OTHER): Payer: Medicare Other | Admitting: Lab

## 2011-02-28 VITALS — BP 167/88 | HR 86 | Temp 98.8°F | Ht 64.0 in | Wt 163.4 lb

## 2011-02-28 DIAGNOSIS — C189 Malignant neoplasm of colon, unspecified: Secondary | ICD-10-CM

## 2011-02-28 DIAGNOSIS — Z9889 Other specified postprocedural states: Secondary | ICD-10-CM

## 2011-02-28 DIAGNOSIS — Z85038 Personal history of other malignant neoplasm of large intestine: Secondary | ICD-10-CM

## 2011-02-28 LAB — CBC WITH DIFFERENTIAL/PLATELET
Basophils Absolute: 0 10*3/uL (ref 0.0–0.1)
Eosinophils Absolute: 0.1 10*3/uL (ref 0.0–0.5)
HCT: 42.9 % (ref 34.8–46.6)
HGB: 14 g/dL (ref 11.6–15.9)
LYMPH%: 31.7 % (ref 14.0–49.7)
MCV: 91.9 fL (ref 79.5–101.0)
MONO#: 0.3 10*3/uL (ref 0.1–0.9)
MONO%: 5.4 % (ref 0.0–14.0)
NEUT#: 3.5 10*3/uL (ref 1.5–6.5)
NEUT%: 60.9 % (ref 38.4–76.8)
Platelets: 359 10*3/uL (ref 145–400)
RBC: 4.67 10*6/uL (ref 3.70–5.45)
WBC: 5.8 10*3/uL (ref 3.9–10.3)
nRBC: 0 % (ref 0–0)

## 2011-02-28 LAB — COMPREHENSIVE METABOLIC PANEL
ALT: 12 U/L (ref 0–35)
CO2: 30 mEq/L (ref 19–32)
Calcium: 9.7 mg/dL (ref 8.4–10.5)
Chloride: 103 mEq/L (ref 96–112)
Creatinine, Ser: 1.07 mg/dL (ref 0.50–1.10)
Glucose, Bld: 104 mg/dL — ABNORMAL HIGH (ref 70–99)
Total Bilirubin: 0.6 mg/dL (ref 0.3–1.2)

## 2011-02-28 NOTE — Progress Notes (Signed)
This office note has been dictated.

## 2011-02-28 NOTE — Progress Notes (Signed)
CC:   Nicole G. Renard Matter, MD Lennie Muckle, MD Jonette Eva, M.D.  IDENTIFYING STATEMENT:  [name] is a 76 year old, black female with history of colon cancer who presents for followup.  INTERIM HISTORY:  [name] reports since her last clinic visit in May 2012 that she spent a lot of her time taking care of her husband who was ill.  She states her husband died on 2011-03-06.  Currently, the patient reports some mild fatigue which she attributes to not sleeping well due to the circumstances of her husband's recent death.  She has had no fevers, chills, or night sweats.  No dyspnea or cough.  She has normal appetite.  She has not had any nausea, vomiting, constipation, or diarrhea.  No rectal bleeding.  She states her last colonoscopy was in May 2012 which did not reveal any malignancy.  She has had no dysuria. No frequency or hematuria.  No swelling of extremities.  She states her last evaluation by her primary physician, Dr. Renard Bailey was in October and at that visit, he decreased her Synthroid to 50 mcg.  No other changes in medications. The patient is accompanied today by her daughter.  PHYSICAL EXAMINATION:  Vital Signs:  Temperature is 98.8.  Heart rate 86.  Respirations 20.  Blood pressure 167/88.  Weight 163.4 pounds. General Appearance:  This is a well-developed, well-nourished, black female in no acute distress.  HEENT:  Sclerae nonicteric.  There is no oral thrush or mucositis.  Skin:  No rashes or lesions.  Lymphs:  No cervical, supraclavicular, axillary, or inguinal lymphadenopathy. Cardiac:  Regular rate and rhythm without murmurs or gallops. Peripheral pulses are 2+.  Chest:  Lungs are clear to auscultation. Abdomen:  Positive bowel sounds.  Soft, nontender, nondistended.  No organomegaly.  Extremities:  The patient has had no swelling of extremities.  Neuro:  Alert and oriented x3.  Strength, sensation, and coordination are all grossly  intact.  LABORATORY DATA:  CBC with diff reveals white blood count of 5.8, hemoglobin 14, hematocrit 42.9, platelets of 359, ANC of 3.5 and MCV of 91.9.  CMET and CEA are currently pending.  IMPRESSION/PLAN: 1. [name] is a 76 year old, black female who is status     post laparoscopic right hemicolectomy with lysis of adhesions on     07/31/2009 for an adenocarcinoma within a polyp and 0/25 lymph     nodes sampled had evidence of malignancy.  The patient last had a     colonoscopy on 07/16/2010 which revealed polyps in the descending     and sigmoid colon, the pathology of which revealed multiple     fragments of hyperplastic polyps.  The patient last had a CT scan     of the chest, abdomen, and pelvis with contrast on 08/27/2010.  The     chest revealed no evidence of thoracic metastasis.  In the abdomen     and pelvis, no evidence of colon cancer recurrence within the     abdomen or pelvis.  No evidence of metastasis.  Stable right     hemicolectomy. 2. Per Dr. Dalene Carrow, the patient be scheduled for followup visit in 6     months' time.  A few days before this, we will reassess CBC with     diff, CMET, and CEA.  The patient is advised to call in the interim     with any questions or problems.   ______________________________ Sherilyn Banker, MSN, ANP, BC RJ/MEDQ  D:  02/28/2011  T:  02/28/2011  Job:  [id number]

## 2011-03-16 NOTE — Progress Notes (Signed)
REVIEWED.  

## 2011-03-31 ENCOUNTER — Ambulatory Visit: Payer: Medicare Other | Admitting: Hematology and Oncology

## 2011-03-31 ENCOUNTER — Other Ambulatory Visit: Payer: Medicare Other | Admitting: Lab

## 2011-08-30 ENCOUNTER — Encounter: Payer: Self-pay | Admitting: Hematology and Oncology

## 2011-08-30 ENCOUNTER — Other Ambulatory Visit (HOSPITAL_BASED_OUTPATIENT_CLINIC_OR_DEPARTMENT_OTHER): Payer: Medicare Other | Admitting: Lab

## 2011-08-30 ENCOUNTER — Ambulatory Visit (HOSPITAL_BASED_OUTPATIENT_CLINIC_OR_DEPARTMENT_OTHER): Payer: Medicare Other | Admitting: Hematology and Oncology

## 2011-08-30 ENCOUNTER — Telehealth: Payer: Self-pay | Admitting: Hematology and Oncology

## 2011-08-30 VITALS — BP 182/95 | HR 94 | Temp 99.0°F | Ht 64.0 in | Wt 168.9 lb

## 2011-08-30 DIAGNOSIS — Z85038 Personal history of other malignant neoplasm of large intestine: Secondary | ICD-10-CM

## 2011-08-30 DIAGNOSIS — C189 Malignant neoplasm of colon, unspecified: Secondary | ICD-10-CM

## 2011-08-30 LAB — CBC WITH DIFFERENTIAL/PLATELET
Basophils Absolute: 0.1 10*3/uL (ref 0.0–0.1)
Eosinophils Absolute: 0.3 10*3/uL (ref 0.0–0.5)
HCT: 42.5 % (ref 34.8–46.6)
HGB: 14.3 g/dL (ref 11.6–15.9)
LYMPH%: 38.8 % (ref 14.0–49.7)
MCV: 93.7 fL (ref 79.5–101.0)
MONO#: 0.5 10*3/uL (ref 0.1–0.9)
NEUT#: 3.1 10*3/uL (ref 1.5–6.5)
NEUT%: 48.4 % (ref 38.4–76.8)
Platelets: 339 10*3/uL (ref 145–400)
WBC: 6.5 10*3/uL (ref 3.9–10.3)

## 2011-08-30 LAB — COMPREHENSIVE METABOLIC PANEL
Alkaline Phosphatase: 89 U/L (ref 39–117)
CO2: 31 mEq/L (ref 19–32)
Creatinine, Ser: 1.11 mg/dL — ABNORMAL HIGH (ref 0.50–1.10)
Glucose, Bld: 101 mg/dL — ABNORMAL HIGH (ref 70–99)
Total Bilirubin: 0.8 mg/dL (ref 0.3–1.2)

## 2011-08-30 LAB — CEA: CEA: <0.5 ng/mL (ref 0.0–5.0)

## 2011-08-30 NOTE — Patient Instructions (Signed)
[name]  [id number]  [facility] Discharge Instructions  RECOMMENDATIONS MADE BY THE CONSULTANT AND ANY TEST RESULTS WILL BE SENT TO YOUR REFERRING DOCTOR.   EXAM FINDINGS BY MD TODAY AND SIGNS AND SYMPTOMS TO REPORT TO CLINIC OR PRIMARY MD:   Your current list of medications are: Current Outpatient Prescriptions  Medication Sig Dispense Refill  . amLODipine (NORVASC) 5 MG tablet Take 1 tablet by mouth daily.      Marland Kitchen levothyroxine (SYNTHROID, LEVOTHROID) 50 MCG tablet Take 50 mcg by mouth daily.        . raloxifene (EVISTA) 60 MG tablet Take 60 mg by mouth daily.        . RESTASIS 0.05 % ophthalmic emulsion Place 1 drop into both eyes 2 (two) times daily.       . rosuvastatin (CRESTOR) 20 MG tablet Take 20 mg by mouth daily.        Wallene Dales ophthalmic ointment Apply 1 application topically Twice daily.         INSTRUCTIONS GIVEN AND DISCUSSED:   SPECIAL INSTRUCTIONS/FOLLOW-UP:  See above.  I acknowledge that I have been informed and understand all the instructions given to me and received a copy. I do not have any more questions at this time, but understand that I may call the [facility] at [phone] during business hours should I have any further questions or need assistance in obtaining follow-up care.

## 2011-08-30 NOTE — Progress Notes (Signed)
CC:   Angus G. Renard Matter, MD Lennie Muckle, MD Jonette Eva, M.D.  IDENTIFYING STATEMENT:  The patient is a 77 year old woman with colon cancer who presents for followup.  INTERVAL HISTORY:  The patient was last seen 6 months ago.  Has no complaints.  Misses her husband who died 6 months ago.  Has had no issues with rectal bleeding or change in bowel function.  Maintains her weight is stable.  Works part-time as a Scientist, physiological at [employer].  MEDICATIONS:  Reviewed and updated.  ALLERGIES:  None.  PHYSICAL EXAMINATION:  General:  The patient is a well-appearing, well- nourished woman in no distress.  Vitals:  Pulse 94, blood pressure 182/95, temperature 99, respirations 20, weight 158.9 pounds.  HEENT: Head is atraumatic, normocephalic.  Sclerae anicteric.  Mouth moist. Neck:  Supple.  Chest:  Clear.  Abdomen:  Soft, nontender.  Bowel sounds present.  Extremities:  No edema.  LABORATORY DATA:  08/30/2011 white cell count 6.5, hemoglobin 14.3, hematocrit 42.5, platelets 339.  CEA < 0.5. CMET pending.  IMPRESSION AND PLAN:  Ms. Antonelli is a 77 year old woman who is status post laparoscopic right hemicolectomy with lysis of adhesions on 07/31/2009 for an adenocarcinoma within a polyp with 0 of 25 lymph nodes sampled having evidence of malignancy.  Her last colonoscopy was on 07/16/2010.  Her exam and lab work indicate no evidence of recurrence. She will continue surveillance.  She plans to follow up in 6 months' time, at which time we will be obtaining lab work to also include a CT scan.    ______________________________ Laurice Record, M.D. LIO/MEDQ  D:  08/30/2011  T:  08/30/2011  Job:  [id number]

## 2011-08-30 NOTE — Telephone Encounter (Signed)
gve the pt her jan 2014 appt calendar along with the ct scan appt

## 2011-08-30 NOTE — Progress Notes (Signed)
This office note has been dictated.

## 2011-10-13 NOTE — Progress Notes (Signed)
TCS 2015-COLON CANCER

## 2011-11-15 ENCOUNTER — Other Ambulatory Visit (HOSPITAL_COMMUNITY): Payer: Self-pay | Admitting: Family Medicine

## 2011-11-15 DIAGNOSIS — Z139 Encounter for screening, unspecified: Secondary | ICD-10-CM

## 2011-11-17 ENCOUNTER — Ambulatory Visit (HOSPITAL_COMMUNITY)
Admission: RE | Admit: 2011-11-17 | Discharge: 2011-11-17 | Disposition: A | Discharge status: Home / Self Care | Payer: Medicare Other | Source: Ambulatory Visit | Attending: Family Medicine | Admitting: Family Medicine

## 2011-11-17 DIAGNOSIS — Z139 Encounter for screening, unspecified: Secondary | ICD-10-CM

## 2011-11-17 DIAGNOSIS — Z1231 Encounter for screening mammogram for malignant neoplasm of breast: Secondary | ICD-10-CM | POA: Insufficient documentation

## 2012-01-27 ENCOUNTER — Other Ambulatory Visit (HOSPITAL_COMMUNITY): Payer: Self-pay | Admitting: Family Medicine

## 2012-01-27 ENCOUNTER — Ambulatory Visit (HOSPITAL_COMMUNITY)
Admission: RE | Admit: 2012-01-27 | Discharge: 2012-01-27 | Disposition: A | Discharge status: Home / Self Care | Payer: Medicare Other | Source: Ambulatory Visit | Attending: Family Medicine | Admitting: Family Medicine

## 2012-01-27 DIAGNOSIS — M899 Disorder of bone, unspecified: Secondary | ICD-10-CM | POA: Insufficient documentation

## 2012-01-27 DIAGNOSIS — M810 Age-related osteoporosis without current pathological fracture: Secondary | ICD-10-CM

## 2012-01-27 DIAGNOSIS — M949 Disorder of cartilage, unspecified: Secondary | ICD-10-CM | POA: Insufficient documentation

## 2012-03-03 ENCOUNTER — Telehealth: Payer: Self-pay | Admitting: Oncology

## 2012-03-03 NOTE — Telephone Encounter (Signed)
lm to call to r/s her dr lo appt(r/s to kc/dr shadad)      Thurston Hole

## 2012-03-05 ENCOUNTER — Telehealth: Payer: Self-pay | Admitting: Oncology

## 2012-03-05 NOTE — Telephone Encounter (Signed)
pt had called me back and she is aware of her new phy and appt info

## 2012-03-11 ENCOUNTER — Emergency Department (HOSPITAL_COMMUNITY): Payer: Medicare Other

## 2012-03-11 ENCOUNTER — Encounter (HOSPITAL_COMMUNITY): Payer: Self-pay | Admitting: Emergency Medicine

## 2012-03-11 ENCOUNTER — Inpatient Hospital Stay (HOSPITAL_COMMUNITY)
Admission: EM | Admit: 2012-03-11 | Discharge: 2012-03-14 | DRG: 203 | Disposition: A | Discharge status: Home / Self Care | Payer: Medicare Other | Attending: Family Medicine | Admitting: Family Medicine

## 2012-03-11 DIAGNOSIS — Z79899 Other long term (current) drug therapy: Secondary | ICD-10-CM

## 2012-03-11 DIAGNOSIS — J209 Acute bronchitis, unspecified: Principal | ICD-10-CM | POA: Diagnosis present

## 2012-03-11 DIAGNOSIS — Z9049 Acquired absence of other specified parts of digestive tract: Secondary | ICD-10-CM

## 2012-03-11 DIAGNOSIS — J111 Influenza due to unidentified influenza virus with other respiratory manifestations: Secondary | ICD-10-CM | POA: Diagnosis present

## 2012-03-11 DIAGNOSIS — J329 Chronic sinusitis, unspecified: Secondary | ICD-10-CM | POA: Diagnosis present

## 2012-03-11 DIAGNOSIS — Z85038 Personal history of other malignant neoplasm of large intestine: Secondary | ICD-10-CM

## 2012-03-11 DIAGNOSIS — E785 Hyperlipidemia, unspecified: Secondary | ICD-10-CM | POA: Diagnosis present

## 2012-03-11 DIAGNOSIS — E876 Hypokalemia: Secondary | ICD-10-CM | POA: Diagnosis present

## 2012-03-11 DIAGNOSIS — I10 Essential (primary) hypertension: Secondary | ICD-10-CM | POA: Diagnosis present

## 2012-03-11 DIAGNOSIS — B9789 Other viral agents as the cause of diseases classified elsewhere: Secondary | ICD-10-CM | POA: Diagnosis present

## 2012-03-11 DIAGNOSIS — E890 Postprocedural hypothyroidism: Secondary | ICD-10-CM | POA: Diagnosis present

## 2012-03-11 LAB — CBC WITH DIFFERENTIAL/PLATELET
Basophils Absolute: 0 10*3/uL (ref 0.0–0.1)
Basophils Relative: 0 % (ref 0–1)
Eosinophils Absolute: 0 10*3/uL (ref 0.0–0.7)
Hemoglobin: 13.7 g/dL (ref 12.0–15.0)
MCH: 30.9 pg (ref 26.0–34.0)
MCHC: 34.2 g/dL (ref 30.0–36.0)
Monocytes Relative: 5 % (ref 3–12)
Neutro Abs: 7.7 10*3/uL (ref 1.7–7.7)
Neutrophils Relative %: 90 % — ABNORMAL HIGH (ref 43–77)
Platelets: 324 10*3/uL (ref 150–400)

## 2012-03-11 LAB — BASIC METABOLIC PANEL
BUN: 13 mg/dL (ref 6–23)
Calcium: 8.9 mg/dL (ref 8.4–10.5)
GFR calc Af Amer: 59 mL/min — ABNORMAL LOW (ref >90)
GFR calc non Af Amer: 51 mL/min — ABNORMAL LOW (ref >90)
Potassium: 2.9 mEq/L — ABNORMAL LOW (ref 3.5–5.1)
Sodium: 138 mEq/L (ref 135–145)

## 2012-03-11 LAB — MAGNESIUM: Magnesium: 1.9 mg/dL (ref 1.5–2.5)

## 2012-03-11 MED ORDER — DEXTROSE 5 % IV SOLN
1.0000 g | INTRAVENOUS | Status: DC
  Administered 2012-03-11 – 2012-03-13 (×3): 1 g via INTRAVENOUS
  Filled 2012-03-11 (×4): qty 10

## 2012-03-11 MED ORDER — DM-GUAIFENESIN ER 30-600 MG PO TB12
ORAL_TABLET | ORAL | Status: AC
  Administered 2012-03-11: 23:00:00
  Filled 2012-03-11: qty 1

## 2012-03-11 MED ORDER — ALBUTEROL SULFATE (5 MG/ML) 0.5% IN NEBU
2.5000 mg | INHALATION_SOLUTION | RESPIRATORY_TRACT | Status: DC
  Administered 2012-03-12: 2.5 mg via RESPIRATORY_TRACT
  Filled 2012-03-11: qty 0.5

## 2012-03-11 MED ORDER — ACETAMINOPHEN 500 MG PO TABS
1000.0000 mg | ORAL_TABLET | Freq: Once | ORAL | Status: AC
  Administered 2012-03-11: 1000 mg via ORAL
  Filled 2012-03-11: qty 2

## 2012-03-11 MED ORDER — CYCLOSPORINE 0.05 % OP EMUL
1.0000 [drp] | Freq: Two times a day (BID) | OPHTHALMIC | Status: DC
  Administered 2012-03-12 – 2012-03-13 (×4): 1 [drp] via OPHTHALMIC
  Filled 2012-03-11 (×11): qty 1

## 2012-03-11 MED ORDER — ATORVASTATIN CALCIUM 40 MG PO TABS
40.0000 mg | ORAL_TABLET | Freq: Every day | ORAL | Status: DC
  Administered 2012-03-12 – 2012-03-13 (×2): 40 mg via ORAL
  Filled 2012-03-11 (×3): qty 1

## 2012-03-11 MED ORDER — AMLODIPINE BESYLATE 5 MG PO TABS
5.0000 mg | ORAL_TABLET | Freq: Every day | ORAL | Status: DC
  Administered 2012-03-12 – 2012-03-14 (×3): 5 mg via ORAL
  Filled 2012-03-11 (×3): qty 1

## 2012-03-11 MED ORDER — SODIUM CHLORIDE 0.9 % IV BOLUS (SEPSIS)
1000.0000 mL | Freq: Once | INTRAVENOUS | Status: AC
  Administered 2012-03-11: 1000 mL via INTRAVENOUS

## 2012-03-11 MED ORDER — IPRATROPIUM BROMIDE 0.02 % IN SOLN
0.5000 mg | RESPIRATORY_TRACT | Status: DC
  Administered 2012-03-12: 0.5 mg via RESPIRATORY_TRACT
  Filled 2012-03-11: qty 2.5

## 2012-03-11 MED ORDER — ENOXAPARIN SODIUM 30 MG/0.3ML ~~LOC~~ SOLN: 30.0000 mg | SUBCUTANEOUS | Status: DC

## 2012-03-11 MED ORDER — ASPIRIN 81 MG PO CHEW
81.0000 mg | CHEWABLE_TABLET | Freq: Every day | ORAL | Status: DC
  Administered 2012-03-12 – 2012-03-14 (×3): 81 mg via ORAL
  Filled 2012-03-11 (×3): qty 1

## 2012-03-11 MED ORDER — DEXTROSE 5 % IV SOLN
500.0000 mg | INTRAVENOUS | Status: DC
  Administered 2012-03-12 – 2012-03-13 (×3): 500 mg via INTRAVENOUS
  Filled 2012-03-11 (×3): qty 500

## 2012-03-11 MED ORDER — RALOXIFENE HCL 60 MG PO TABS
60.0000 mg | ORAL_TABLET | Freq: Every day | ORAL | Status: DC
  Administered 2012-03-12 – 2012-03-14 (×3): 60 mg via ORAL
  Filled 2012-03-11 (×5): qty 1

## 2012-03-11 MED ORDER — IPRATROPIUM-ALBUTEROL 0.5-2.5 (3) MG/3ML IN SOLN: 3.0000 mL | RESPIRATORY_TRACT | Status: DC

## 2012-03-11 MED ORDER — ACETAMINOPHEN 325 MG PO TABS
650.0000 mg | ORAL_TABLET | ORAL | Status: DC | PRN
  Administered 2012-03-12: 650 mg via ORAL
  Filled 2012-03-11: qty 2

## 2012-03-11 MED ORDER — LEVOTHYROXINE SODIUM 50 MCG PO TABS
50.0000 ug | ORAL_TABLET | Freq: Every day | ORAL | Status: DC
  Administered 2012-03-12 – 2012-03-14 (×3): 50 ug via ORAL
  Filled 2012-03-11 (×6): qty 1

## 2012-03-11 MED ORDER — DEXTROSE 5 % IV SOLN
INTRAVENOUS | Status: AC
  Filled 2012-03-11: qty 500

## 2012-03-11 MED ORDER — GUAIFENESIN ER 600 MG PO TB12
600.0000 mg | ORAL_TABLET | Freq: Two times a day (BID) | ORAL | Status: DC
  Administered 2012-03-12 – 2012-03-14 (×5): 600 mg via ORAL
  Filled 2012-03-11 (×9): qty 1

## 2012-03-11 NOTE — ED Notes (Signed)
Pt c/o cough, fever, and weakness since Friday. Pt's temp 102.7 on arrival. Pt denies NVD.

## 2012-03-11 NOTE — ED Provider Notes (Signed)
History   This chart was scribed for Donnetta Hutching, MD by Leone Payor, ED Scribe. This patient was seen in room [facility]10/[facility]10 and the patient's care was started at 2150.   CSN: [id number]  Arrival date & time 03/11/12  2036   First MD Initiated Contact with Patient 03/11/12 2150      Chief Complaint  Patient presents with  . Fever  . Cough    (Consider location/radiation/quality/duration/timing/severity/associated sxs/prior treatment) The history is provided by the patient and a relative. No language interpreter was used.    [name] is a 77 y.o. female who presents to the Emergency Department complaining of a new, mild cough productive of clear sputum starting 3 days ago. Daughter states she had an associated fever starting earlier today (102.7 on arrival). Daughter reports pt's mental status appeared to be slightly diminished and was not as responsive as normal. Pt has associated nasal congestion but denies dysuria.   PCP Dr. Laurell Josephs.  Pt has h/o HTN, hyperlipidemia, hypothyroidism.  Past Medical History  Diagnosis Date  . Hyperlipidemia   . HTN (hypertension)   . Hypothyroidism   . History of colon cancer 06/2009    found at time of TCS 06/29/09, 1.2cm sessile cecal polyp, no adjuvent therapy needed  . Colon cancer     colon ca dx 07/30/09    Past Surgical History  Procedure Date  . Colon surgery 07/2009    right hemicolectomy, no residual colon cancer on path  . Partial thyroidectomy     benign tumors  . Partial hysterectomy     Family History  Problem Relation Age of Onset  . Colon cancer Mother     >age60  . Heart attack Father   . Liver disease Neg Hx     History  Substance Use Topics  . Smoking status: Never Smoker   . Smokeless tobacco: Never Used  . Alcohol Use: No    No OB history provided.   Review of Systems A complete 10 system review of systems was obtained and all systems are negative except as noted in the HPI and PMH.     Allergies  Review of patient's allergies indicates no known allergies.  Home Medications   Current Outpatient Rx  Name  Route  Sig  Dispense  Refill  . AMLODIPINE BESYLATE 5 MG PO TABS   Oral   Take 1 tablet by mouth daily.         . CELECOXIB 200 MG PO CAPS   Oral   Take 200 mg by mouth as needed. Arthritis pain         . LEVOTHYROXINE SODIUM 50 MCG PO TABS   Oral   Take 50 mcg by mouth daily.           Marland Kitchen RALOXIFENE HCL 60 MG PO TABS   Oral   Take 60 mg by mouth daily.          . RESTASIS 0.05 % OP EMUL   Both Eyes   Place 1 drop into both eyes 2 (two) times daily.          Marland Kitchen ROSUVASTATIN CALCIUM 20 MG PO TABS   Oral   Take 20 mg by mouth daily.           . TOBRADEX 0.3-0.1 % OP OINT   Topical   Apply 1 application topically 2 (two) times daily as needed.            BP 157/64  Pulse  114  Temp 102.7 F (39.3 C) (Oral)  Resp 20  SpO2 93%  Physical Exam  Nursing note and vitals reviewed. Constitutional: She is oriented to person, place, and time. She appears well-developed and well-nourished.  HENT:  Head: Normocephalic and atraumatic.  Eyes: Conjunctivae normal and EOM are normal. Pupils are equal, round, and reactive to light.  Neck: Normal range of motion. Neck supple.  Cardiovascular: Normal rate, regular rhythm and normal heart sounds.   Pulmonary/Chest: Effort normal and breath sounds normal.  Abdominal: Soft. Bowel sounds are normal.  Musculoskeletal: Normal range of motion.  Neurological: She is alert and oriented to person, place, and time.  Skin: Skin is warm and dry.  Psychiatric: She has a normal mood and affect.    ED Course  Procedures (including critical care time)  DIAGNOSTIC STUDIES: Oxygen Saturation is 93% on room air, adequate by my interpretation.    COORDINATION OF CARE:   10:01 PM Discussed treatment plan which includes IV fluids, chest x-ray, blood work with pt at bedside and pt agreed to plan.    Labs  Reviewed  BASIC METABOLIC PANEL  CBC WITH DIFFERENTIAL  URINALYSIS, ROUTINE W REFLEX MICROSCOPIC  MAGNESIUM  TSH  BASIC METABOLIC PANEL  HEPATIC FUNCTION PANEL  CULTURE, BLOOD (ROUTINE X 2)  CULTURE, BLOOD (ROUTINE X 2)  URINALYSIS, ROUTINE W REFLEX MICROSCOPIC  Dg Chest Port 1 View  03/11/2012  *RADIOLOGY REPORT*  Clinical Data: Cough and fever.  Colon carcinoma.  PORTABLE CHEST - 1 VIEW  Comparison: 07/01/2009  Findings: Pleural - parenchymal scarring again seen in the left lung base.  No evidence of acute infiltrate or edema.  No evidence of pleural effusion.  Heart size is within normal limits.  IMPRESSION: Left basilar scarring.  No active disease.   Original Report Authenticated By: Myles Rosenthal, M.D.    No results found.   No diagnosis found.    MDM  Cough, weakness, fever for 3 days.  Daughter reports failure to thrive. Admit    I personally performed the services described in this documentation, which was scribed in my presence. The recorded information has been reviewed and is accurate.      Donnetta Hutching, MD 03/11/12 [phone]

## 2012-03-11 NOTE — H&P (Signed)
[id number] 

## 2012-03-12 LAB — BASIC METABOLIC PANEL
BUN: 11 mg/dL (ref 6–23)
Calcium: 8.9 mg/dL (ref 8.4–10.5)
Chloride: 104 mEq/L (ref 96–112)
Creatinine, Ser: 1.1 mg/dL (ref 0.50–1.10)
GFR calc Af Amer: 55 mL/min — ABNORMAL LOW (ref >90)
GFR calc non Af Amer: 48 mL/min — ABNORMAL LOW (ref >90)

## 2012-03-12 LAB — URINE CULTURE
Colony Count: NO GROWTH
Culture: NO GROWTH

## 2012-03-12 LAB — HEPATIC FUNCTION PANEL
ALT: 13 U/L (ref 0–35)
Bilirubin, Direct: 0.1 mg/dL (ref 0.0–0.3)
Indirect Bilirubin: 0.4 mg/dL (ref 0.3–0.9)
Total Protein: 7.9 g/dL (ref 6.0–8.3)

## 2012-03-12 LAB — INFLUENZA PANEL BY PCR (TYPE A & B): Influenza B By PCR: NEGATIVE

## 2012-03-12 LAB — URINALYSIS, ROUTINE W REFLEX MICROSCOPIC
Hgb urine dipstick: NEGATIVE
Nitrite: NEGATIVE
Specific Gravity, Urine: 1.02 (ref 1.005–1.030)
Urobilinogen, UA: 0.2 mg/dL (ref 0.0–1.0)

## 2012-03-12 LAB — TSH: TSH: 0.286 u[IU]/mL — ABNORMAL LOW (ref 0.350–4.500)

## 2012-03-12 MED ORDER — POTASSIUM CHLORIDE 10 MEQ/100ML IV SOLN
10.0000 meq | INTRAVENOUS | Status: AC
  Administered 2012-03-12 (×4): 10 meq via INTRAVENOUS
  Filled 2012-03-12: qty 300

## 2012-03-12 MED ORDER — POTASSIUM CHLORIDE 10 MEQ/100ML IV SOLN: 10.0000 meq | INTRAVENOUS | Status: DC

## 2012-03-12 MED ORDER — LOPERAMIDE HCL 2 MG PO CAPS: 2.0000 mg | ORAL_CAPSULE | ORAL | Status: DC | PRN

## 2012-03-12 MED ORDER — POTASSIUM CHLORIDE 10 MEQ/100ML IV SOLN
INTRAVENOUS | Status: AC
  Administered 2012-03-12: 10 meq via INTRAVENOUS
  Filled 2012-03-12: qty 100

## 2012-03-12 MED ORDER — OSELTAMIVIR PHOSPHATE 75 MG PO CAPS
75.0000 mg | ORAL_CAPSULE | Freq: Three times a day (TID) | ORAL | Status: DC
  Administered 2012-03-12 – 2012-03-14 (×6): 75 mg via ORAL
  Filled 2012-03-12 (×7): qty 1

## 2012-03-12 MED ORDER — IPRATROPIUM BROMIDE 0.02 % IN SOLN
0.5000 mg | Freq: Four times a day (QID) | RESPIRATORY_TRACT | Status: DC
  Administered 2012-03-12 – 2012-03-14 (×8): 0.5 mg via RESPIRATORY_TRACT
  Filled 2012-03-12 (×8): qty 2.5

## 2012-03-12 MED ORDER — ALBUTEROL SULFATE (5 MG/ML) 0.5% IN NEBU
2.5000 mg | INHALATION_SOLUTION | Freq: Four times a day (QID) | RESPIRATORY_TRACT | Status: DC
  Administered 2012-03-12 – 2012-03-14 (×8): 2.5 mg via RESPIRATORY_TRACT
  Filled 2012-03-12 (×8): qty 0.5

## 2012-03-12 MED ORDER — ENOXAPARIN SODIUM 40 MG/0.4ML ~~LOC~~ SOLN
40.0000 mg | SUBCUTANEOUS | Status: DC
  Administered 2012-03-12 – 2012-03-13 (×2): 40 mg via SUBCUTANEOUS
  Filled 2012-03-12 (×3): qty 0.4

## 2012-03-12 NOTE — Progress Notes (Signed)
UR Chart Review Completed  

## 2012-03-12 NOTE — Progress Notes (Signed)
Subjective: The patient is fairly comfortable as morning. She did have several episodes of diarrhea. She does have upper respiratory congestion and has productive cough. She continues to have low-grade fever. By x-rays she did not have pneumonia.   Objective: Vital signs in last 24 hours: Temp:  [100.2 F (37.9 C)-102.7 F (39.3 C)] 100.6 F (38.1 C) (12/30 0432) Pulse Rate:  [104-114] 104  (12/30 0011) Resp:  [20] 20  (12/30 0432) BP: (137-157)/(64-73) 149/72 mmHg (12/30 0432) SpO2:  [92 %-96 %] 96 % (12/30 0432) Weight:  [77 kg (169 lb 12.1 oz)] 77 kg (169 lb 12.1 oz) (12/30 0011) Weight change:  Last BM Date: 03/10/12  Intake/Output from previous day: 12/29 0701 - 12/30 0700 In: 700 [IV Piggyback:700] Out: 450 [Urine:450] Intake/Output this shift: Total I/O In: 700 [IV Piggyback:700] Out: 450 [Urine:450]  Physical Exam: The patient is alert and oriented  Blood pressure 149/72 pulse 104 temp 100.2 H. EENT eyes PERRLA TM negative  Lungs occasional rhonchus heard oral lung field  Heart regular rhythm no murmurs  Abdomen the palpable organs or masses    Basename 03/11/12 2213  WBC 8.5  HGB 13.7  HCT 40.1  PLT 324   BMET  Basename 03/11/12 2213  NA 138  K 2.9*  CL 102  CO2 24  GLUCOSE 128*  BUN 13  CREATININE 1.04  CALCIUM 8.9    Studies/Results: Dg Chest Port 1 View  03/11/2012  *RADIOLOGY REPORT*  Clinical Data: Cough and fever.  Colon carcinoma.  PORTABLE CHEST - 1 VIEW  Comparison: 07/01/2009  Findings: Pleural - parenchymal scarring again seen in the left lung base.  No evidence of acute infiltrate or edema.  No evidence of pleural effusion.  Heart size is within normal limits.  IMPRESSION: Left basilar scarring.  No active disease.   Original Report Authenticated By: Myles Rosenthal, M.D.     Medications:     . ipratropium  0.5 mg Nebulization Q6H   And  . albuterol  2.5 mg Nebulization Q6H  . amLODipine  5 mg Oral Daily  . aspirin  81 mg Oral  Daily  . atorvastatin  40 mg Oral q1800  . azithromycin  500 mg Intravenous Q24H  . cefTRIAXone (ROCEPHIN)  IV  1 g Intravenous Q24H  . cycloSPORINE  1 drop Both Eyes BID  . enoxaparin (LOVENOX) injection  30 mg Subcutaneous Q24H  . guaiFENesin  600 mg Oral BID  . levothyroxine  50 mcg Oral QAC breakfast  . potassium chloride  10 mEq Intravenous Q1 Hr x 4  . raloxifene  60 mg Oral Daily        Assessment/Plan: 1 exacerbation of bronchitis 2. Sinusitis 3. Low serum potassium 4. Hypertension hypothyroidism  Plan to replete serum potassium and to continue current antibiotic therapy with Rocephin and Zithromax and Imodium when necessary for diarrhea Lihanna Biever G 03/12/2012, 6:26 AM

## 2012-03-12 NOTE — H&P (Signed)
[name], [name] NO.:  [id number]  MEDICAL RECORD NO.:  [id number]  LOCATION:  A309                          FACILITY:  [facility]  PHYSICIAN:  Melvyn Novas, MDDATE OF BIRTH:  12-03-1934  DATE OF ADMISSION:  03/11/2012 DATE OF DISCHARGE:  LH                             HISTORY & PHYSICAL   This is 77 year old black female, patient of Dr. Renard Bailey, who lives at home alone who complains of 2-day history of nasal congestion, stuffiness, cough, and greenish sputum production.  She has some mild dyspnea, but denies any anginal-type chest pain.  She likewise denies pleuritic chest pain.  She was seen today and had a temperature of 103 at home.  She comes into the ER today this evening with temperature of 102.7.  She denies dysuria, frequency, rigors, chills, myalgias, nausea, vomiting, or diarrhea.  She is admitted with acute exacerbation of bronchitis, possible sinusitis, possible viral syndrome.  Blood cultures and urine analysis performed and treated with aggressive pulmonary toilet,  Rocephin and Zithromax empirically, and Tylenol for fever.  PAST MEDICAL HISTORY:  Significant for hypertension, hyperlipidemia, surgically-induced hypothyroidism, and history of colon cancer status post colectomy.  PAST SURGICAL HISTORY:  Remarkable for right hemicolectomy with no residual cancer, partial thyroidectomy, and vaginal hysterectomy.  CURRENT MEDICINES: 1. Norvasc 5 p.o. daily. 2. Synthroid 50 mcg per day. 3. Raloxifene 60 mg p.o. daily. 4. Crestor 20 mg per day.  REVIEW OF SYSTEMS:  Negative for seizures, tremors, polyuria, polydipsia, sputum, hemoptysis, nausea, vomiting, melena, hematemesis, hematochezia.  PHYSICAL EXAMINATION:  VITAL SIGNS:  Blood pressure is 157/64, temperature 102.7, pulse is 114 and regular, respiratory rate is 20, O2 sat is 93%. HEENT:  Eyes, PERRLA.  Extraocular movements intact.  Sclerae clear. Conjunctivae pink. NECK:  No  JVD.  No carotid bruits.  No thyromegaly.  No thyroid bruits. LUNGS:  Prolonged expiratory phase.  Diminished breath sounds at the bases.  Scattered rhonchi.  Mild end-expiratory wheeze noted. HEART:  Regular rhythm.  No S3, S4.  No heaves, thrills, or rubs. ABDOMEN:  Soft, nontender.  Bowel sounds normoactive.  No guarding, rebound, mass, or megaly. EXTREMITIES:  Trace to 1+ pedal edema bilaterally. NEUROLOGIC:  Cranial nerves II-XII grossly intact.  The patient moves all 4 extremities.  Plantars are downgoing.  Impression as follows: 1. Acute exacerbation bronchitis with bronchospastic component. 2. Probable sinusitis. 3. Possible viral syndrome. 4. Hypertension. 5. Hyperlipidemia. 6. Hypothyroidism.  PLAN:  Right now is to give blood cultures, urinalysis, Tylenol for fever, Rocephin and Zithromax empirically.  Await culture analysis and workup for flu-like symptoms.     Melvyn Novas, MD     RMD/MEDQ  D:  03/11/2012  T:  03/12/2012  Job:  [id number]

## 2012-03-12 NOTE — Progress Notes (Signed)
ANTICOAGULATION CONSULT NOTE - Initial Consult  Pharmacy Consult for Lovenox Indication: VTE prophylaxis  No Known Allergies  Patient Measurements: Height: 5\' 4"  (162.6 cm) Weight: 169 lb 12.1 oz (77 kg) IBW/kg (Calculated) : 54.7   Vital Signs: Temp: 100.6 F (38.1 C) (12/30 0432) Temp src: Oral (12/30 0432) BP: 149/72 mmHg (12/30 0432) Pulse Rate: 104  (12/30 0011)  Labs:  Basename 03/12/12 0458 03/11/12 2213  HGB -- 13.7  HCT -- 40.1  PLT -- 324  APTT -- --  LABPROT -- --  INR -- --  HEPARINUNFRC -- --  CREATININE 1.10 1.04  CKTOTAL -- --  CKMB -- --  TROPONINI -- --    Estimated Creatinine Clearance: 44.4 ml/min (by C-G formula based on Cr of 1.1).   Medical History: Past Medical History  Diagnosis Date  . Hyperlipidemia   . HTN (hypertension)   . Hypothyroidism   . History of colon cancer 06/2009    found at time of TCS 06/29/09, 1.2cm sessile cecal polyp, no adjuvent therapy needed  . Colon cancer     colon ca dx 07/30/09    Medications:  Scheduled:    . [COMPLETED] acetaminophen  1,000 mg Oral Once  . ipratropium  0.5 mg Nebulization Q6H   And  . albuterol  2.5 mg Nebulization Q6H  . amLODipine  5 mg Oral Daily  . aspirin  81 mg Oral Daily  . atorvastatin  40 mg Oral q1800  . azithromycin  500 mg Intravenous Q24H  . cefTRIAXone (ROCEPHIN)  IV  1 g Intravenous Q24H  . cycloSPORINE  1 drop Both Eyes BID  . [COMPLETED] dextromethorphan-guaiFENesin      . enoxaparin (LOVENOX) injection  40 mg Subcutaneous Q24H  . guaiFENesin  600 mg Oral BID  . levothyroxine  50 mcg Oral QAC breakfast  . [COMPLETED] potassium chloride  10 mEq Intravenous Q1 Hr x 4  . raloxifene  60 mg Oral Daily  . [COMPLETED] sodium chloride  1,000 mL Intravenous Once  . [DISCONTINUED] albuterol  2.5 mg Nebulization Q4H  . [DISCONTINUED] enoxaparin (LOVENOX) injection  30 mg Subcutaneous Q24H  . [DISCONTINUED] ipratropium  0.5 mg Nebulization Q4H  . [DISCONTINUED]  ipratropium-albuterol  3 mL Nebulization Q4H  . [DISCONTINUED] potassium chloride  10 mEq Intravenous Q1 Hr x 3    Assessment: 77yo female with clcr > 30.  Estimated Creatinine Clearance: 44.4 ml/min (by C-G formula based on Cr of 1.1).  Goal of Therapy:  VTE prophylaxis Monitor platelets by anticoagulation protocol: Yes   Plan:  Lovenox 40mg  SQ q24hrs CBC per protocol  Valrie Hart A 03/12/2012,7:36 AM

## 2012-03-12 NOTE — Clinical Documentation Improvement (Signed)
Abnormal Labs Clarification  THIS DOCUMENT IS NOT A PERMANENT PART OF THE MEDICAL RECORD  TO RESPOND TO THE THIS QUERY, FOLLOW THE INSTRUCTIONS BELOW:  1. If needed, update documentation for the patient's encounter via the notes activity.  2. Access this query again and click edit on the [employer].  3. After updating, or not, click F2 to complete all highlighted (required) fields concerning your review. Select "additional documentation in the medical record" OR "no additional documentation provided".  4. Click Sign note button.  5. The deficiency will fall out of your InBasket *Please let us know if you are not able to complete this workflow by phone or e-mail (listed below).  Please update your documentation within the medical record to reflect your response to this query.                                                                                   03/12/12  Dear Dr. Renard Matter Marton Redwood  In a better effort to capture your patient's severity of illness, reflect appropriate length of stay and utilization of resources, a review of the medical record has revealed the following indicators.  Based on your clinical judgment, please clarify and document in a progress note and/or discharge summary the clinical condition associated with the following supporting information: In responding to this query please exercise your independent judgment.  The fact that a query is asked, does not imply that any particular answer is desired or expected.Abnormal findings (laboratory, x-ray, pathologic, and other diagnostic results) are not coded and reported unless the physician indicates their clinical significance.   The medical record reflects the following clinical findings, please clarify the diagnostic and/or clinical significance:     Potential Diagnosis:  -Hypokalemia - Other Condition (please specify) - Cannot Clinically Determine   Clinical Information:   Lab Test:Potassium  Normal  Values: 3.5-5.1  Patient Values: 12/29 = 2.9 12/30 = 3.5 MD states "Low serum potassium"  Treatment:  IV KCL q1h x 4 doses Monitor BMP  Reviewed: additional documentation in the medical record [place] answered "hypokalemia" in 12/31pn   Thank You,  Harless Litten RN, MSN Clinical Documentation Specialist: Office# [phone] [facility] Health Information Management [place]

## 2012-03-12 NOTE — Care Management Note (Signed)
    Page 1 of 1   03/12/2012     11:26:12 AM   CARE MANAGEMENT NOTE 03/12/2012  Patient:  [name], [name]   Account Number:  [id number]  Date Initiated:  03/12/2012  Documentation initiated by:  Sharrie Rothman  Subjective/Objective Assessment:   Pt admitted from home with bronchitis and UTI. Pt lives alone and is independent with ADL's. Pt is Contractor at [employer].     Action/Plan:   No CM or HH needs noted.   Anticipated DC Date:  03/14/2012   Anticipated DC Plan:  HOME/SELF CARE      DC Planning Services  CM consult      Choice offered to / List presented to:             Status of service:  Completed, signed off Medicare Important Message given?   (If response is "NO", the following Medicare IM given date fields will be blank) Date Medicare IM given:   Date Additional Medicare IM given:    Discharge Disposition:    Per UR Regulation:    If discussed at Long Length of Stay Meetings, dates discussed:    Comments:  03/12/12 1121 Arlyss Queen, RN BSN CM

## 2012-03-12 NOTE — Progress Notes (Signed)
Patient flu swab positive - MD notified.  Order for tamiflu given

## 2012-03-13 LAB — BASIC METABOLIC PANEL
BUN: 11 mg/dL (ref 6–23)
Creatinine, Ser: 1.16 mg/dL — ABNORMAL HIGH (ref 0.50–1.10)
GFR calc non Af Amer: 45 mL/min — ABNORMAL LOW (ref >90)
Glucose, Bld: 102 mg/dL — ABNORMAL HIGH (ref 70–99)
Potassium: 3.4 mEq/L — ABNORMAL LOW (ref 3.5–5.1)

## 2012-03-13 LAB — HEPATIC FUNCTION PANEL
ALT: 13 U/L (ref 0–35)
AST: 24 U/L (ref 0–37)
Bilirubin, Direct: <0.1 mg/dL (ref 0.0–0.3)
Total Bilirubin: 0.3 mg/dL (ref 0.3–1.2)

## 2012-03-13 NOTE — Progress Notes (Signed)
Subjective: The patient continues to have productive cough. She tested positive for influenza. She did have a low serum potassium level and received IV potassium her current potassium is 3.5. She remains afebrile.   Objective: Vital signs in last 24 hours: Temp:  [98.6 F (37 C)-100.7 F (38.2 C)] 98.6 F (37 C) (12/30 2057) Pulse Rate:  [91-106] 91  (12/30 2057) Resp:  [20] 20  (12/30 2057) BP: (131-144)/(74-77) 131/74 mmHg (12/30 2057) SpO2:  [95 %-97 %] 95 % (12/30 2126) Weight change:  Last BM Date: 03/12/12  Intake/Output from previous day: 12/30 0701 - 12/31 0700 In: 660 [P.O.:360; IV Piggyback:300] Out: 150 [Urine:150] Intake/Output this shift: Total I/O In: 300 [IV Piggyback:300] Out: -   Physical Exam: The patient is alert and oriented  Blood pressure 131/74, respiration 20, pulse rate 91 temp 98.6  Lungs occasional rhonchus heard over lower lung field  Heart regular rhythm no murmurs  Abdomen no palpable organs or masses   Basename 03/11/12 2213  WBC 8.5  HGB 13.7  HCT 40.1  PLT 324   BMET  Basename 03/12/12 0458 03/11/12 2213  NA 140 138  K 3.5 2.9*  CL 104 102  CO2 23 24  GLUCOSE 113* 128*  BUN 11 13  CREATININE 1.10 1.04  CALCIUM 8.9 8.9    Studies/Results: Dg Chest Port 1 View  03/11/2012  *RADIOLOGY REPORT*  Clinical Data: Cough and fever.  Colon carcinoma.  PORTABLE CHEST - 1 VIEW  Comparison: 07/01/2009  Findings: Pleural - parenchymal scarring again seen in the left lung base.  No evidence of acute infiltrate or edema.  No evidence of pleural effusion.  Heart size is within normal limits.  IMPRESSION: Left basilar scarring.  No active disease.   Original Report Authenticated By: Myles Rosenthal, M.D.     Medications:     . ipratropium  0.5 mg Nebulization Q6H   And  . albuterol  2.5 mg Nebulization Q6H  . amLODipine  5 mg Oral Daily  . aspirin  81 mg Oral Daily  . atorvastatin  40 mg Oral q1800  . azithromycin  500 mg Intravenous  Q24H  . cefTRIAXone (ROCEPHIN)  IV  1 g Intravenous Q24H  . cycloSPORINE  1 drop Both Eyes BID  . enoxaparin (LOVENOX) injection  40 mg Subcutaneous Q24H  . guaiFENesin  600 mg Oral BID  . levothyroxine  50 mcg Oral QAC breakfast  . oseltamivir  75 mg Oral TID  . raloxifene  60 mg Oral Daily        Assessment/Plan: 1. Upper respiratory infection, influenza 2. Sinusitis 3. Hypokinemia 4. Hypertension hypothyroidism  Plan to continue current antibiotic therapy with Rocephin and Zithromax, continue nebulizer treatments, continue to monitor monitor chemistries. Discussion was held with the patient concerning her possible discharge tomorrow.    LOS: 2 days   Yaiden Yang G 03/13/2012, 5:58 AM

## 2012-03-14 LAB — HEPATIC FUNCTION PANEL
Bilirubin, Direct: <0.1 mg/dL (ref 0.0–0.3)
Total Bilirubin: 0.3 mg/dL (ref 0.3–1.2)

## 2012-03-14 LAB — BASIC METABOLIC PANEL
BUN: 12 mg/dL (ref 6–23)
Calcium: 9.1 mg/dL (ref 8.4–10.5)
GFR calc non Af Amer: 45 mL/min — ABNORMAL LOW (ref >90)
Glucose, Bld: 101 mg/dL — ABNORMAL HIGH (ref 70–99)
Sodium: 139 mEq/L (ref 135–145)

## 2012-03-14 MED ORDER — OSELTAMIVIR PHOSPHATE 75 MG PO CAPS: 75.0000 mg | ORAL_CAPSULE | Freq: Two times a day (BID) | ORAL | Status: DC

## 2012-03-14 MED ORDER — ATORVASTATIN CALCIUM 40 MG PO TABS: 40.0000 mg | ORAL_TABLET | Freq: Every day | ORAL | Status: DC

## 2012-03-14 NOTE — Discharge Summary (Signed)
[name], [name]        ACCOUNT NO.:  [id number]  MEDICAL RECORD NO.:  [id number]  LOCATION:  A309                          FACILITY:  [facility]  PHYSICIAN:  Ulysess Witz G. Renard Matter, MD   DATE OF BIRTH:  1934-08-02  DATE OF ADMISSION:  03/11/2012 DATE OF DISCHARGE:  LH                              DISCHARGE SUMMARY   This 78 year old female, was admitted on March 11, 2012,  discharged on March 14, 2012, 3 days hospitalization.  DIAGNOSES:  Exacerbation of bronchitis and sinusitis, influenza, hypertension,  dyslipidemia, hypothyroidism, prior history of colon cancer status post colectomy.  CONDITION:  Stable and improved at the time of her discharge.  This patient lives alone at home, developed a 2-day history of nasal congestion, cough productive of greenish sputum,  mild dyspnea.  No chest pain.  Apparently developed a temperature of 103 at home, came to the emergency room where she was evaluated, at that time the temperature was a 102.7.  She was admitted with what was felt to be acute exacerbation of bronchitis, possible sinusitis, possible viral syndrome. Blood cultures and urinalysis were performed.  She was placed on Rocephin and Zithromax empirically and Tylenol for fever.  PHYSICAL EXAMINATION:  GENERAL:  Alert female. VITAL SIGNS:  Blood pressure of 157/64, temp 102.7, pulse 114, respirations 20, O2 sats 93%. HEENT:  Eyes PERRLA.  TM negative. Oropharynx benign. NECK:  Supple.  No JVD or thyroid abnormalities. LUNGS:  Prolonged expiratory phase, diminished breath sounds, scattered rhonchi.  Mild end-expiratory wheeze noted. HEART:  Regular rhythm. ABDOMEN:  No palpable organs or masses. EXTREMITIES:  Free of edema.  LABORATORY DATA:  Admission CBC, WBC 8500 with hemoglobin 13.7, hematocrit 40.1, 90 neutrophils, 5 lymphocytes.  Chemistries on admission, sodium 138, potassium 2.9, chloride of 102, CO2 of 24, BUN 13, creatinine 1.04.  Calcium 8.9, GFR 51, glucose  128.  Subsequent chemistries on the last day,  sodium 139, potassium 3.3, chloride 103, CO2 27, BUN 12, creatinine 1.16.  Blood glucose is ranged from 101-128. TSH is 0.286.  Influenza A was positive.  Urinalysis negative.  Blood culture, no growth.  Urinalysis negative.  C. difficile negative.  X-ray of chest, left basilar scarring,  no acute or active disease process.  HOSPITAL COURSE:  The patient was empirically started on IV fluids with Zithromax 500 mg every 24 hours, Rocephin 1 g every 24 hours.  She did test positive for influenza and was started on Tamiflu 75 mg p.o. t.i.d. She was coughing intermittently initially and was running fever,  became rapidly afebrile during hospital stay.  It was noted she did have a low serum potassium level and was given runs of potassium.  This brought her potassium to 3.4 and subsequently 3.3.  The patient progressively improved during the hospital stay.  She was continued on amlodipine 5 mg daily, aspirin 81 mg daily,  atorvastatin 40 mg daily,  Restasis ophthalmic suspension b.i.d. and she did receive  nebulizer treatment with albuterol.  She was continued on  Synthroid 50 mcg daily, Tamiflu was added 75 mg t.i.d. and Evista 60 mg daily.  Guaifenesin 600 mg b.i.d. and DVT prophylaxis with Lovenox 40 mg subcutaneously daily.  The patient progressively improved, and it  was felt she could be  discharged and continued on antibiotic regimen for several more days.  Levaquin 500 mg daily was given.     Beyonce Sawatzky G. Renard Matter, MD     AGM/MEDQ  D:  03/14/2012  T:  03/14/2012  Job:  [id number]

## 2012-03-14 NOTE — Discharge Summary (Signed)
[name], [name]        ACCOUNT NO.:  [id number]  MEDICAL RECORD NO.:  [id number]  LOCATION:  A309                          FACILITY:  [facility]  PHYSICIAN:  Nataleah Scioneaux G. Renard Matter, MD   DATE OF BIRTH:  1934/03/17  DATE OF ADMISSION:  03/11/2012 DATE OF DISCHARGE:  LH                              DISCHARGE SUMMARY   ADDENDUM:  The patient was discharged on the following medications: 1. Amlodipine 5 mg daily. 2. TobraDex ophthalmic ointment one application topically 2 times     daily as needed. 3. Crestor 20 mg daily. 4. Evista 60 mg daily. 5. Restasis 0.05% ophthalmic emulsion 1 drop in both eyes 2 times     daily. 6. Synthroid 50 mcg daily. 7. Celebrex 200 mg daily.  She was given prescription for Tamiflu 75 mg to be taken t.i.d. and Levaquin 500 mg 1 daily for 5 days.     Saba Gomm G. Renard Matter, MD     AGM/MEDQ  D:  03/14/2012  T:  03/14/2012  Job:  [id number]

## 2012-03-17 ENCOUNTER — Encounter: Payer: Self-pay | Admitting: *Deleted

## 2012-03-17 LAB — CULTURE, BLOOD (ROUTINE X 2)
Culture: NO GROWTH
Culture: NO GROWTH

## 2012-04-02 ENCOUNTER — Ambulatory Visit (HOSPITAL_COMMUNITY)
Admission: RE | Admit: 2012-04-02 | Discharge: 2012-04-02 | Disposition: A | Discharge status: Home / Self Care | Payer: Medicare Other | Source: Ambulatory Visit | Attending: Hematology and Oncology | Admitting: Hematology and Oncology

## 2012-04-02 ENCOUNTER — Other Ambulatory Visit (HOSPITAL_BASED_OUTPATIENT_CLINIC_OR_DEPARTMENT_OTHER): Payer: Medicare Other | Admitting: Lab

## 2012-04-02 ENCOUNTER — Encounter (HOSPITAL_COMMUNITY): Payer: Self-pay

## 2012-04-02 DIAGNOSIS — K7689 Other specified diseases of liver: Secondary | ICD-10-CM | POA: Insufficient documentation

## 2012-04-02 DIAGNOSIS — Z90710 Acquired absence of both cervix and uterus: Secondary | ICD-10-CM | POA: Insufficient documentation

## 2012-04-02 DIAGNOSIS — C189 Malignant neoplasm of colon, unspecified: Secondary | ICD-10-CM | POA: Insufficient documentation

## 2012-04-02 DIAGNOSIS — E041 Nontoxic single thyroid nodule: Secondary | ICD-10-CM | POA: Insufficient documentation

## 2012-04-02 DIAGNOSIS — N281 Cyst of kidney, acquired: Secondary | ICD-10-CM | POA: Insufficient documentation

## 2012-04-02 LAB — CBC WITH DIFFERENTIAL/PLATELET
Eosinophils Absolute: 0.2 10*3/uL (ref 0.0–0.5)
MONO#: 0.5 10*3/uL (ref 0.1–0.9)
MONO%: 9.7 % (ref 0.0–14.0)
NEUT#: 2.9 10*3/uL (ref 1.5–6.5)
RBC: 4.66 10*6/uL (ref 3.70–5.45)
RDW: 14.8 % — ABNORMAL HIGH (ref 11.2–14.5)
WBC: 5.4 10*3/uL (ref 3.9–10.3)
lymph#: 1.7 10*3/uL (ref 0.9–3.3)

## 2012-04-02 LAB — CEA: CEA: 0.6 ng/mL (ref 0.0–5.0)

## 2012-04-02 LAB — COMPREHENSIVE METABOLIC PANEL (CC13)
ALT: 10 U/L (ref 0–55)
CO2: 28 mEq/L (ref 22–29)
Chloride: 105 mEq/L (ref 98–107)
Sodium: 143 mEq/L (ref 136–145)
Total Bilirubin: 0.79 mg/dL (ref 0.20–1.20)
Total Protein: 8 g/dL (ref 6.4–8.3)

## 2012-04-02 MED ORDER — IOHEXOL 300 MG/ML  SOLN
100.0000 mL | Freq: Once | INTRAMUSCULAR | Status: AC | PRN
  Administered 2012-04-02: 100 mL via INTRAVENOUS

## 2012-04-03 ENCOUNTER — Encounter: Payer: Self-pay | Admitting: Oncology

## 2012-04-03 ENCOUNTER — Telehealth: Payer: Self-pay | Admitting: Oncology

## 2012-04-03 ENCOUNTER — Ambulatory Visit (HOSPITAL_BASED_OUTPATIENT_CLINIC_OR_DEPARTMENT_OTHER): Payer: Medicare Other | Admitting: Oncology

## 2012-04-03 VITALS — BP 166/88 | HR 91 | Temp 98.1°F | Resp 20 | Ht 64.0 in | Wt 168.0 lb

## 2012-04-03 DIAGNOSIS — E039 Hypothyroidism, unspecified: Secondary | ICD-10-CM

## 2012-04-03 DIAGNOSIS — C189 Malignant neoplasm of colon, unspecified: Secondary | ICD-10-CM

## 2012-04-03 DIAGNOSIS — I10 Essential (primary) hypertension: Secondary | ICD-10-CM

## 2012-04-03 NOTE — Telephone Encounter (Signed)
gv and printed appt schedule for pt for jan 2015.....gv pt barium..the patient aware central scheduling will contact with d/t of ct

## 2012-04-03 NOTE — Progress Notes (Signed)
Hematology and Oncology Follow Up Visit  [name] [id number] 09-08-1934 77 y.o. 04/03/2012 3:10 PM [name], MDMcInnis, Ishmael Holter, MD   Principle Diagnosis: Stage I colon cancer.  Prior Therapy: The patient is status post laparoscopic right hemicolectomy with lysis of adhesions on 07/31/2009 for an adenocarcinoma within a polyp with 0 of 25 lymph nodes sampled having evidence of malignancy.  Current therapy: Watchful observation.  Interim History:  [name] returns for routine followup by herself. It has been about 6 months since we last saw her. She was recently hospitalized for a flu which has now resolved. Her appetite has improved since her acute illness has resolved. Her appetite is good and her weight is stable compared to 6 months ago. She denies any rectal bleeding. No change in her bowel function. No bowel pain, nausea, vomiting. 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  Medications: I have reviewed the patient's current medications. Current outpatient prescriptions:amLODipine (NORVASC) 5 MG tablet, Take 1 tablet by mouth daily., Disp: , Rfl: ;  celecoxib (CELEBREX) 200 MG capsule, Take 200 mg by mouth as needed. Arthritis pain, Disp: , Rfl: ;  levothyroxine (SYNTHROID, LEVOTHROID) 50 MCG tablet, Take 50 mcg by mouth daily.  , Disp: , Rfl: ;  raloxifene (EVISTA) 60 MG tablet, Take 60 mg by mouth daily. , Disp: , Rfl:  RESTASIS 0.05 % ophthalmic emulsion, Place 1 drop into both eyes 2 (two) times daily. , Disp: , Rfl: ;  rosuvastatin (CRESTOR) 20 MG tablet, Take 20 mg by mouth daily.  , Disp: , Rfl: ;  TOBRADEX ophthalmic ointment, Apply 1 application topically 2 (two) times daily as needed. , Disp: , Rfl:   Allergies: No Known Allergies  Past Medical History, Surgical history, Social history, and Family History were reviewed and updated.  Review of Systems: Constitutional:  Negative for fever, chills, night sweats, anorexia, weight loss,  pain. Cardiovascular: no chest pain or dyspnea on exertion Respiratory: no cough, shortness of breath, or wheezing Neurological: no TIA or stroke symptoms Dermatological: negative ENT: negative Skin: Negative. Gastrointestinal: no abdominal pain, change in bowel habits, or black or bloody stools Genito-Urinary: no dysuria, trouble voiding, or hematuria Hematological and Lymphatic: negative Breast: negative for breast lumps Musculoskeletal: negative Remaining ROS negative.  Physical Exam: Blood pressure 166/88, pulse 91, temperature 98.1 F (36.7 C), temperature source Oral, resp. rate 20, height 5\' 4"  (1.626 m), weight 168 lb (76.204 kg). ECOG: 1 General appearance: alert, cooperative and no distress Head: Normocephalic, without obvious abnormality, atraumatic Neck: no adenopathy, no carotid bruit, no JVD, supple, symmetrical, trachea midline and thyroid not enlarged, symmetric, no tenderness/mass/nodules Lymph nodes: Cervical, supraclavicular, and axillary nodes normal. Heart:regular rate and rhythm, S1, S2 normal, no murmur, click, rub or gallop Lung:chest clear, no wheezing, rales, normal symmetric air entry, no tachypnea, retractions or cyanosis Abdomin: soft, non-tender, without masses or organomegaly EXT:no erythema, induration, or nodules   Lab Results: Lab Results  Component Value Date   WBC 5.4 04/02/2012   HGB 14.6 04/02/2012   HCT 42.9 04/02/2012   MCV 92.1 04/02/2012   PLT 371 04/02/2012     Chemistry      Component Value Date/Time   NA 143 04/02/2012 0914   NA 139 03/14/2012 0502   NA 139 08/27/2010 0909   K 3.5 04/02/2012 0914   K 3.3* 03/14/2012 0502   K 3.9 08/27/2010 0909   CL 105 04/02/2012 0914   CL 103 03/14/2012 0502   CL 99  08/27/2010 0909   CO2 28 04/02/2012 0914   CO2 27 03/14/2012 0502   CO2 31 08/27/2010 0909   BUN 12.0 04/02/2012 0914   BUN 12 03/14/2012 0502   BUN 13 08/27/2010 0909   CREATININE 1.0 04/02/2012 0914   CREATININE 1.16* 03/14/2012 0502    CREATININE 1.2 08/27/2010 0909      Component Value Date/Time   CALCIUM 9.7 04/02/2012 0914   CALCIUM 9.1 03/14/2012 0502   CALCIUM 9.5 08/27/2010 0909   ALKPHOS 70 04/02/2012 0914   ALKPHOS 63 03/14/2012 0502   ALKPHOS 101* 08/27/2010 0909   AST 14 04/02/2012 0914   AST 22 03/14/2012 0502   AST 25 08/27/2010 0909   ALT 10 04/02/2012 0914   ALT 14 03/14/2012 0502   BILITOT 0.79 04/02/2012 0914   BILITOT 0.3 03/14/2012 0502   BILITOT 0.80 08/27/2010 0909     CEA 0.6  Radiological Studies: Ct Chest W Contrast  04/02/2012  *RADIOLOGY REPORT*  Clinical Data:  Colon cancer.  CT CHEST, ABDOMEN AND PELVIS WITH CONTRAST  Technique:  Multidetector CT imaging of the chest, abdomen and pelvis was performed following the standard protocol during bolus administration of intravenous contrast.  Contrast: OMNIPAQUE IOHEXOL 300 MG/ML  SOLN  Comparison:  08/27/2010.  CT CHEST  Findings:  The chest wall is unremarkable.  No breast masses, supraclavicular or axillary lymphadenopathy.  Stable complex left thyroid nodule.  The bony thorax is intact.  No destructive bone lesions or spinal canal compromise.  The heart is normal in size.  No pericardial effusion.  No mediastinal or hilar lymphadenopathy.  The esophagus is grossly normal.  The aorta is normal in caliber.  No dissection.  Examination of the lung parenchyma demonstrates no acute pulmonary findings.  No worrisome pulmonary nodules or masses. There are stable areas of scarring change in the lower lung zones.  IMPRESSION:  1.  Unremarkable and stable CT appearance of the chest.  No findings to suggest metastatic disease. 2.  Stable 2.3 cm complex left thyroid nodule.  CT ABDOMEN AND PELVIS  Findings:  The liver demonstrates a stable hypodense lesion in the right lobe.  This is likely a benign hepatic cyst.  No worrisome hepatic lesions or intrahepatic biliary dilatation.  The gallbladder is unremarkable.  No common bile duct dilatation.  The pancreas is normal and  stable.  The spleen is normal.  The adrenal glands and kidneys are unremarkable.  A stable bilateral renal cysts.  The stomach, duodenum, small bowel and colon are unremarkable.  No mesenteric or retroperitoneal mass or adenopathy.  A small scattered nodes are stable.  Stable surgical changes from a right hemicolectomy.  The anastomoses appears normal.  The aorta is normal in caliber.  The major branch vessels are normal.  To left renal arteries are noted.  A retroaortic left renal vein is noted.  The uterus is surgically absent.  The right ovary is still present and appears normal.  Left ovary is not identified.  The bladder is normal except for mild wall thickening.  No pelvic mass, adenopathy or free pelvic fluid collections.  No inguinal mass or hernia.  The bony structures are intact.  IMPRESSION: Unremarkable and stable CT appearance of the abdomen/pelvis.  No findings for metastatic disease.   Original Report Authenticated By: Rudie Meyer, M.D.    Ct Abdomen Pelvis W Contrast  04/02/2012  *RADIOLOGY REPORT*  Clinical Data:  Colon cancer.  CT CHEST, ABDOMEN AND PELVIS WITH CONTRAST  Technique:  Multidetector CT imaging of the chest, abdomen and pelvis was performed following the standard protocol during bolus administration of intravenous contrast.  Contrast: OMNIPAQUE IOHEXOL 300 MG/ML  SOLN  Comparison:  08/27/2010.  CT CHEST  Findings:  The chest wall is unremarkable.  No breast masses, supraclavicular or axillary lymphadenopathy.  Stable complex left thyroid nodule.  The bony thorax is intact.  No destructive bone lesions or spinal canal compromise.  The heart is normal in size.  No pericardial effusion.  No mediastinal or hilar lymphadenopathy.  The esophagus is grossly normal.  The aorta is normal in caliber.  No dissection.  Examination of the lung parenchyma demonstrates no acute pulmonary findings.  No worrisome pulmonary nodules or masses. There are stable areas of scarring change in the  lower lung zones.  IMPRESSION:  1.  Unremarkable and stable CT appearance of the chest.  No findings to suggest metastatic disease. 2.  Stable 2.3 cm complex left thyroid nodule.  CT ABDOMEN AND PELVIS  Findings:  The liver demonstrates a stable hypodense lesion in the right lobe.  This is likely a benign hepatic cyst.  No worrisome hepatic lesions or intrahepatic biliary dilatation.  The gallbladder is unremarkable.  No common bile duct dilatation.  The pancreas is normal and stable.  The spleen is normal.  The adrenal glands and kidneys are unremarkable.  A stable bilateral renal cysts.  The stomach, duodenum, small bowel and colon are unremarkable.  No mesenteric or retroperitoneal mass or adenopathy.  A small scattered nodes are stable.  Stable surgical changes from a right hemicolectomy.  The anastomoses appears normal.  The aorta is normal in caliber.  The major branch vessels are normal.  To left renal arteries are noted.  A retroaortic left renal vein is noted.  The uterus is surgically absent.  The right ovary is still present and appears normal.  Left ovary is not identified.  The bladder is normal except for mild wall thickening.  No pelvic mass, adenopathy or free pelvic fluid collections.  No inguinal mass or hernia.  The bony structures are intact.  IMPRESSION: Unremarkable and stable CT appearance of the abdomen/pelvis.  No findings for metastatic disease.   Original Report Authenticated By: Rudie Meyer, M.D.      Impression and Plan: Mrs. 78 year old female with the following issues:  1. Colon cancer. The patient is status post laparoscopic right hemicolectomy in May 2011. I discussed with the patient that based on history, physical exam, labs, and CT scans that there is no evidence of recurrence. Recommend continued observation. Last colonoscopy was performed in May 2012 and is due again in 2015. We will plan to repeat her CT scans in one year.  2. Hypertension. The patient is on  amlodipine per PCP.  3. Hypothyroidism. The patient is on Synthroid per PCP.  4. Followup. She will have labs, CT scan, and visit in one year.  Case reviewed with Dr Clelia Croft. Spent more than half the time coordinating care.    [place], [place] 1/21/20143:10 PM

## 2012-04-05 ENCOUNTER — Ambulatory Visit: Payer: Medicare Other | Admitting: Hematology and Oncology

## 2012-11-13 ENCOUNTER — Other Ambulatory Visit (HOSPITAL_COMMUNITY): Payer: Self-pay | Admitting: Family Medicine

## 2012-11-13 DIAGNOSIS — Z139 Encounter for screening, unspecified: Secondary | ICD-10-CM

## 2012-11-20 ENCOUNTER — Ambulatory Visit (HOSPITAL_COMMUNITY)
Admission: RE | Admit: 2012-11-20 | Discharge: 2012-11-20 | Disposition: A | Discharge status: Home / Self Care | Payer: Medicare Other | Source: Ambulatory Visit | Attending: Family Medicine | Admitting: Family Medicine

## 2012-11-20 DIAGNOSIS — Z139 Encounter for screening, unspecified: Secondary | ICD-10-CM

## 2012-11-20 DIAGNOSIS — Z1231 Encounter for screening mammogram for malignant neoplasm of breast: Secondary | ICD-10-CM | POA: Insufficient documentation

## 2013-02-21 ENCOUNTER — Encounter (HOSPITAL_COMMUNITY): Payer: Self-pay | Admitting: Emergency Medicine

## 2013-02-21 ENCOUNTER — Emergency Department (HOSPITAL_COMMUNITY)
Admission: EM | Admit: 2013-02-21 | Discharge: 2013-02-21 | Disposition: A | Discharge status: Home / Self Care | Payer: Medicare Other | Attending: Emergency Medicine | Admitting: Emergency Medicine

## 2013-02-21 ENCOUNTER — Emergency Department (HOSPITAL_COMMUNITY): Payer: Medicare Other

## 2013-02-21 DIAGNOSIS — R002 Palpitations: Secondary | ICD-10-CM | POA: Insufficient documentation

## 2013-02-21 DIAGNOSIS — R42 Dizziness and giddiness: Secondary | ICD-10-CM | POA: Insufficient documentation

## 2013-02-21 DIAGNOSIS — E039 Hypothyroidism, unspecified: Secondary | ICD-10-CM | POA: Insufficient documentation

## 2013-02-21 DIAGNOSIS — I10 Essential (primary) hypertension: Secondary | ICD-10-CM | POA: Insufficient documentation

## 2013-02-21 DIAGNOSIS — E785 Hyperlipidemia, unspecified: Secondary | ICD-10-CM | POA: Insufficient documentation

## 2013-02-21 DIAGNOSIS — Z85038 Personal history of other malignant neoplasm of large intestine: Secondary | ICD-10-CM | POA: Insufficient documentation

## 2013-02-21 DIAGNOSIS — Z79899 Other long term (current) drug therapy: Secondary | ICD-10-CM | POA: Insufficient documentation

## 2013-02-21 DIAGNOSIS — R110 Nausea: Secondary | ICD-10-CM | POA: Insufficient documentation

## 2013-02-21 LAB — CBC WITH DIFFERENTIAL/PLATELET
Eosinophils Relative: 1 % (ref 0–5)
HCT: 45.4 % (ref 36.0–46.0)
Lymphocytes Relative: 25 % (ref 12–46)
Lymphs Abs: 1.4 10*3/uL (ref 0.7–4.0)
MCV: 93 fL (ref 78.0–100.0)
Monocytes Absolute: 0.3 10*3/uL (ref 0.1–1.0)
Platelets: 373 10*3/uL (ref 150–400)
RBC: 4.88 MIL/uL (ref 3.87–5.11)
WBC: 5.7 10*3/uL (ref 4.0–10.5)

## 2013-02-21 LAB — BASIC METABOLIC PANEL
BUN: 11 mg/dL (ref 6–23)
CO2: 28 mEq/L (ref 19–32)
Calcium: 9.9 mg/dL (ref 8.4–10.5)
Chloride: 101 mEq/L (ref 96–112)
Glucose, Bld: 117 mg/dL — ABNORMAL HIGH (ref 70–99)
Potassium: 4.1 mEq/L (ref 3.5–5.1)
Sodium: 140 mEq/L (ref 135–145)

## 2013-02-21 MED ORDER — MECLIZINE HCL 25 MG PO TABS: 25.0000 mg | ORAL_TABLET | Freq: Four times a day (QID) | ORAL | Status: DC

## 2013-02-21 MED ORDER — ONDANSETRON 4 MG PO TBDP: 4.0000 mg | ORAL_TABLET | Freq: Three times a day (TID) | ORAL | Status: DC | PRN

## 2013-02-21 MED ORDER — MECLIZINE HCL 12.5 MG PO TABS
25.0000 mg | ORAL_TABLET | Freq: Once | ORAL | Status: AC
  Administered 2013-02-21: 25 mg via ORAL
  Filled 2013-02-21: qty 2

## 2013-02-21 NOTE — ED Provider Notes (Signed)
CSN: [id number]     Arrival date & time 02/21/13  1478 History  This chart was scribed for Roney Marion, MD,  by Ashley Jacobs, ED Scribe. The patient was seen in room APA18/APA18 and the patient's care was started at 7:07 AM.  First MD Initiated Contact with Patient 02/21/13 [phone]     Chief Complaint  Patient presents with  . Dizziness   (Consider location/radiation/quality/duration/timing/severity/associated sxs/prior Treatment) The history is provided by the patient and medical records. No language interpreter was used.   HPI Comments: [name] is a 78 y.o. female who presents to the Emergency Department complaining of dizziness two hours PTA upon waking. After sitting up for awhile she started to "fall out". Pt was able to ambulate to the bathroom but with difficulty.  Afterwards she experienced heart palpitations. She reports feeling nauseous PTA but not currently. Pt explains if she moves her head "the room spins". Pt denies cough, URI, rhinorrhea, head pain, numbness, weakness, vomiting, leg swelling and frequency. The heart palpitations are worse while sitting up. Pt has tried Asprin and states it somewhat helps. She had a routine check up last week.She denies prior cardiac complications. Pt's parent both had MI.Pt has a medical hx of hyperlipidemia, HTN, hypothyroidism and hx of colon cancer. She does not smoke tobacco.    Past Medical History  Diagnosis Date  . Hyperlipidemia   . HTN (hypertension)   . Hypothyroidism   . History of colon cancer 06/2009    found at time of TCS 06/29/09, 1.2cm sessile cecal polyp, no adjuvent therapy needed  . Colon cancer     colon ca dx 07/30/09   Past Surgical History  Procedure Laterality Date  . Colon surgery  07/2009    right hemicolectomy, no residual colon cancer on path  . Partial thyroidectomy      benign tumors  . Partial hysterectomy     Family History  Problem Relation Age of Onset  . Colon cancer Mother      >age60  . Heart attack Father   . Liver disease Neg Hx    History  Substance Use Topics  . Smoking status: Never Smoker   . Smokeless tobacco: Never Used  . Alcohol Use: No   OB History   Grav Para Term Preterm Abortions TAB SAB Ect Mult Living                 Review of Systems  Cardiovascular: Positive for palpitations. Negative for leg swelling.  Gastrointestinal: Positive for nausea. Negative for vomiting.  Genitourinary: Negative for frequency.  Neurological: Positive for dizziness. Negative for weakness and headaches.  All other systems reviewed and are negative.    Allergies  Review of patient's allergies indicates no known allergies.  Home Medications   Current Outpatient Rx  Name  Route  Sig  Dispense  Refill  . acetaminophen (TYLENOL) 500 MG tablet   Oral   Take 1,000 mg by mouth every 8 (eight) hours as needed (cold).         Marland Kitchen amLODipine (NORVASC) 5 MG tablet   Oral   Take 1 tablet by mouth daily.         . celecoxib (CELEBREX) 200 MG capsule   Oral   Take 200 mg by mouth as needed. Arthritis pain         . DiphenhydrAMINE HCl (BENADRYL ALLERGY PO)   Oral   Take 1 tablet by mouth 2 (two) times a week.         Marland Kitchen  levothyroxine (SYNTHROID, LEVOTHROID) 50 MCG tablet   Oral   Take 50 mcg by mouth daily.           . Multiple Vitamin (MULTI-VITAMINS) TABS   Oral   Take 1 tablet by mouth every other day.         . raloxifene (EVISTA) 60 MG tablet   Oral   Take 60 mg by mouth daily.          . RESTASIS 0.05 % ophthalmic emulsion   Both Eyes   Place 1 drop into both eyes 2 (two) times daily.          . rosuvastatin (CRESTOR) 20 MG tablet   Oral   Take 20 mg by mouth daily.           Wallene Dales ophthalmic ointment   Topical   Apply 1 application topically 2 (two) times daily as needed.          . meclizine (ANTIVERT) 25 MG tablet   Oral   Take 1 tablet (25 mg total) by mouth 4 (four) times daily.   28 tablet   0   .  ondansetron (ZOFRAN ODT) 4 MG disintegrating tablet   Oral   Take 1 tablet (4 mg total) by mouth every 8 (eight) hours as needed for nausea.   5 tablet   0    BP 161/85  Pulse 84  Temp(Src) 97.8 F (36.6 C) (Oral)  Resp 20  Ht 5\' 3"  (1.6 m)  Wt 170 lb (77.111 kg)  BMI 30.12 kg/m2  SpO2 96% Physical Exam  Nursing note and vitals reviewed. Constitutional: She is oriented to person, place, and time. She appears well-developed and well-nourished. No distress.  HENT:  Head: Normocephalic.  Eyes: Conjunctivae are normal. Pupils are equal, round, and reactive to light. No scleral icterus.  Neck: Normal range of motion. Neck supple. No thyromegaly present.  Cardiovascular: Normal rate, regular rhythm and normal heart sounds.  Exam reveals no gallop and no friction rub.   No murmur heard. Pulmonary/Chest: Effort normal and breath sounds normal. No respiratory distress. She has no wheezes. She has no rales.  Negative bruits  Abdominal: Soft. Bowel sounds are normal. She exhibits no distension. There is no tenderness. There is no rebound.  Musculoskeletal: Normal range of motion. She exhibits no tenderness.  Neurological: She is alert and oriented to person, place, and time.  No pronator drift Sitting up pt gets horizontal occular nystagmus.  Normal strength  Skin: Skin is warm and dry. No rash noted.  Psychiatric: She has a normal mood and affect. Her behavior is normal.    ED Course  Procedures (including critical care time) DIAGNOSTIC STUDIES: Oxygen Saturation is 98% on room air, normal by my interpretation.    COORDINATION OF CARE: 7:13 AM Discussed course of care with pt which includes Antivert 25 mg, head CT, laboratory test and EKG . Pt understands and agrees.  Labs Review Labs Reviewed  CBC WITH DIFFERENTIAL - Abnormal; Notable for the following:    Hemoglobin 15.2 (*)    All other components within normal limits  BASIC METABOLIC PANEL - Abnormal; Notable for the  following:    Glucose, Bld 117 (*)    GFR calc non Af Amer 56 (*)    GFR calc Af Amer 65 (*)    All other components within normal limits   Imaging Review Ct Head Wo Contrast  02/21/2013   CLINICAL DATA:  Headache.  Vertigo and hypertension.  Nausea.  EXAM: CT HEAD WITHOUT CONTRAST  TECHNIQUE: Contiguous axial images were obtained from the base of the skull through the vertex without intravenous contrast.  COMPARISON:  None.  FINDINGS: There is no evidence of acute cortical infarct, intracranial hemorrhage, mass, midline shift, or extra-axial fluid collection. There is mild-to-moderate generalized cerebral atrophy. Periventricular white-matter hypodensities are nonspecific but compatible with moderate chronic small vessel ischemic disease. Orbits are unremarkable. The visualized paranasal sinuses and mastoid air cells are clear.  IMPRESSION: Moderate chronic small vessel ischemic disease. No evidence of acute intracranial abnormality.   Electronically Signed   By: Sebastian Ache   On: 02/21/2013 08:20    EKG Interpretation    Date/Time:  Thursday February 21 2013 07:45:07 EST Ventricular Rate:  80 PR Interval:  158 QRS Duration: 76 QT Interval:  394 QTC Calculation: 454 R Axis:   -24 Text Interpretation:  Normal sinus rhythm Normal ECG When compared with ECG of 29-Jul-2009 09:30, No significant change was found Confirmed by Fayrene Fearing  MD, Lutisha Knoche ([id number]) on 02/21/2013 8:58:56 AM            MDM   1. Vertigo    On recheck the patient is asymptomatic. CT scan shows chronic ischemic white matter changes. No abnormalities. No anemia. Normal EKG.  Findings and symptoms are consistent with acute peripheral vertigo secondary to labyrinthitis. Plan will be symptomatic control vertigo precautions.  I personally performed the services described in this documentation, which was scribed in my presence. The recorded information has been reviewed and is accurate.     Roney Marion, MD 02/21/13  [phone]

## 2013-02-21 NOTE — ED Notes (Signed)
Pt lives at home, had episode this am with dizziness, states when she got up from the bed and the "room was spinning" and was nauseated.

## 2013-02-21 NOTE — ED Notes (Signed)
Pt alert & oriented x4, stable gait. Patient given discharge instructions, paperwork & prescription(s). Patient  instructed to stop at the registration desk to finish any additional paperwork. Patient verbalized understanding. Pt left department w/ no further questions. 

## 2013-02-28 ENCOUNTER — Telehealth: Payer: Self-pay | Admitting: Oncology

## 2013-02-28 NOTE — Telephone Encounter (Signed)
lvm for pt regarding to Jan MD visit moved to [facility] pt appt sched///avs and letter to pt.

## 2013-04-03 ENCOUNTER — Ambulatory Visit (HOSPITAL_COMMUNITY)
Admission: RE | Admit: 2013-04-03 | Discharge: 2013-04-03 | Disposition: A | Discharge status: Home / Self Care | Payer: Medicare Other | Source: Ambulatory Visit | Attending: Oncology | Admitting: Oncology

## 2013-04-03 ENCOUNTER — Other Ambulatory Visit (HOSPITAL_BASED_OUTPATIENT_CLINIC_OR_DEPARTMENT_OTHER): Payer: Medicare Other

## 2013-04-03 DIAGNOSIS — N281 Cyst of kidney, acquired: Secondary | ICD-10-CM | POA: Insufficient documentation

## 2013-04-03 DIAGNOSIS — M47814 Spondylosis without myelopathy or radiculopathy, thoracic region: Secondary | ICD-10-CM | POA: Insufficient documentation

## 2013-04-03 DIAGNOSIS — C189 Malignant neoplasm of colon, unspecified: Secondary | ICD-10-CM | POA: Insufficient documentation

## 2013-04-03 DIAGNOSIS — J479 Bronchiectasis, uncomplicated: Secondary | ICD-10-CM | POA: Insufficient documentation

## 2013-04-03 DIAGNOSIS — M47817 Spondylosis without myelopathy or radiculopathy, lumbosacral region: Secondary | ICD-10-CM | POA: Insufficient documentation

## 2013-04-03 DIAGNOSIS — E041 Nontoxic single thyroid nodule: Secondary | ICD-10-CM | POA: Insufficient documentation

## 2013-04-03 LAB — CBC WITH DIFFERENTIAL/PLATELET
BASO%: 0.5 % (ref 0.0–2.0)
BASOS ABS: 0 10*3/uL (ref 0.0–0.1)
EOS%: 2.9 % (ref 0.0–7.0)
Eosinophils Absolute: 0.2 10*3/uL (ref 0.0–0.5)
HEMATOCRIT: 43.5 % (ref 34.8–46.6)
HEMOGLOBIN: 13.8 g/dL (ref 11.6–15.9)
LYMPH%: 34.1 % (ref 14.0–49.7)
MCH: 29.5 pg (ref 25.1–34.0)
MCHC: 31.7 g/dL (ref 31.5–36.0)
MCV: 92.9 fL (ref 79.5–101.0)
MONO#: 0.4 10*3/uL (ref 0.1–0.9)
MONO%: 7.7 % (ref 0.0–14.0)
NEUT%: 54.8 % (ref 38.4–76.8)
NEUTROS ABS: 3 10*3/uL (ref 1.5–6.5)
Platelets: 313 10*3/uL (ref 145–400)
RBC: 4.68 10*6/uL (ref 3.70–5.45)
RDW: 15.1 % — ABNORMAL HIGH (ref 11.2–14.5)
WBC: 5.5 10*3/uL (ref 3.9–10.3)
lymph#: 1.9 10*3/uL (ref 0.9–3.3)

## 2013-04-03 LAB — COMPREHENSIVE METABOLIC PANEL (CC13)
ALK PHOS: 96 U/L (ref 40–150)
ALT: 18 U/L (ref 0–55)
AST: 15 U/L (ref 5–34)
Albumin: 4 g/dL (ref 3.5–5.0)
Anion Gap: 11 mEq/L (ref 3–11)
BILIRUBIN TOTAL: 0.88 mg/dL (ref 0.20–1.20)
BUN: 11.8 mg/dL (ref 7.0–26.0)
CO2: 27 mEq/L (ref 22–29)
CREATININE: 1 mg/dL (ref 0.6–1.1)
Calcium: 10.3 mg/dL (ref 8.4–10.4)
Chloride: 106 mEq/L (ref 98–109)
Glucose: 105 mg/dl (ref 70–140)
Potassium: 3.7 mEq/L (ref 3.5–5.1)
Sodium: 144 mEq/L (ref 136–145)
Total Protein: 8.4 g/dL — ABNORMAL HIGH (ref 6.4–8.3)

## 2013-04-03 LAB — CEA: CEA: 0.6 ng/mL (ref 0.0–5.0)

## 2013-04-03 MED ORDER — IOHEXOL 300 MG/ML  SOLN
100.0000 mL | Freq: Once | INTRAMUSCULAR | Status: AC | PRN
  Administered 2013-04-03: 100 mL via INTRAVENOUS

## 2013-04-05 ENCOUNTER — Ambulatory Visit: Payer: Medicare Other | Admitting: Oncology

## 2013-04-16 ENCOUNTER — Telehealth: Payer: Self-pay | Admitting: Oncology

## 2013-04-16 ENCOUNTER — Encounter: Payer: Self-pay | Admitting: Oncology

## 2013-04-16 ENCOUNTER — Ambulatory Visit (HOSPITAL_BASED_OUTPATIENT_CLINIC_OR_DEPARTMENT_OTHER): Payer: Medicare Other | Admitting: Oncology

## 2013-04-16 VITALS — BP 178/84 | HR 91 | Temp 98.1°F | Resp 18 | Ht 63.0 in | Wt 168.6 lb

## 2013-04-16 DIAGNOSIS — E039 Hypothyroidism, unspecified: Secondary | ICD-10-CM

## 2013-04-16 DIAGNOSIS — C189 Malignant neoplasm of colon, unspecified: Secondary | ICD-10-CM

## 2013-04-16 DIAGNOSIS — I10 Essential (primary) hypertension: Secondary | ICD-10-CM

## 2013-04-16 NOTE — Progress Notes (Signed)
Hematology and Oncology Follow Up Visit  [name] [id number] 12-Apr-1934 79 y.o. 04/16/2013 3:09 PM Lanette Hampshire, MDMcInnis, Starr Sinclair, MD   Principle Diagnosis: 79 year old with Stage I colon cancer diagnosed in 2011.  Prior Therapy: The patient is status post laparoscopic right hemicolectomy with lysis of adhesions on 07/31/2009 for an adenocarcinoma within a polyp with 0 of 25 lymph nodes sampled having evidence of malignancy.  Current therapy: Watchful observation.  Interim History:  [name] returns for routine followup by herself. It has been about 12 months since we last saw her. Since her last visit, she had been doing very well without any major issues. She was diagnosed with vertigo but overall feeling better at this time. Her appetite is good and her weight is stable compared to 12 months ago. She denies any rectal bleeding. No change in her bowel function. No bowel pain, nausea, vomiting.    Medications: I have reviewed the patient's current medications.   Current Outpatient Prescriptions  Medication Sig Dispense Refill  . acetaminophen (TYLENOL) 500 MG tablet Take 1,000 mg by mouth every 8 (eight) hours as needed (cold).      Marland Kitchen amLODipine (NORVASC) 5 MG tablet Take 1 tablet by mouth daily.      . celecoxib (CELEBREX) 200 MG capsule Take 200 mg by mouth as needed. Arthritis pain      . DiphenhydrAMINE HCl (BENADRYL ALLERGY PO) Take 1 tablet by mouth 2 (two) times a week.      . levothyroxine (SYNTHROID, LEVOTHROID) 50 MCG tablet Take 50 mcg by mouth daily.        . meclizine (ANTIVERT) 25 MG tablet Take 1 tablet (25 mg total) by mouth 4 (four) times daily.  28 tablet  0  . Multiple Vitamin (MULTI-VITAMINS) TABS Take 1 tablet by mouth every other day.      . ondansetron (ZOFRAN ODT) 4 MG disintegrating tablet Take 1 tablet (4 mg total) by mouth every 8 (eight) hours as needed for nausea.  5 tablet  0  . raloxifene (EVISTA) 60 MG tablet Take 60 mg by mouth  daily.       . RESTASIS 0.05 % ophthalmic emulsion Place 1 drop into both eyes 2 (two) times daily.       . rosuvastatin (CRESTOR) 20 MG tablet Take 20 mg by mouth daily.        Baird Cancer ophthalmic ointment Apply 1 application topically 2 (two) times daily as needed.        No current facility-administered medications for this visit.    Allergies: No Known Allergies  Past Medical History, Surgical history, Social history, and Family History were reviewed and updated.  Review of Systems: Constitutional:  Negative for fever, chills, night sweats, anorexia, weight loss, pain. Cardiovascular: no chest pain or dyspnea on exertion Respiratory: no cough, shortness of breath, or wheezing Neurological: no TIA or stroke symptoms Dermatological: negative ENT: negative Skin: Negative. Gastrointestinal: no abdominal pain, change in bowel habits, or black or bloody stools Genito-Urinary: no dysuria, trouble voiding, or hematuria Hematological and Lymphatic: negative Breast: negative for breast lumps Musculoskeletal: negative Remaining ROS negative.  Physical Exam: Blood pressure 178/84, pulse 91, temperature 98.1 F (36.7 C), temperature source Oral, resp. rate 18, height 5\' 3"  (1.6 m), weight 168 lb 9.6 oz (76.476 kg). ECOG: 1 General appearance: alert, cooperative and no distress Head: Normocephalic, without obvious abnormality, atraumatic Neck: no adenopathy, no carotid bruit, no JVD, supple, symmetrical, trachea midline and thyroid not  enlarged, symmetric, no tenderness/mass/nodules Lymph nodes: Cervical, supraclavicular, and axillary nodes normal. Heart:regular rate and rhythm, S1, S2 normal, no murmur, click, rub or gallop Lung:chest clear, no wheezing, rales, normal symmetric air entry, no tachypnea, retractions or cyanosis Abdomin: soft, non-tender, without masses or organomegaly EXT:no erythema, induration, or nodules   Lab Results: Lab Results  Component Value Date   WBC 5.5  04/03/2013   HGB 13.8 04/03/2013   HCT 43.5 04/03/2013   MCV 92.9 04/03/2013   PLT 313 04/03/2013     Chemistry      Component Value Date/Time   NA 144 04/03/2013 0805   NA 140 02/21/2013 0743   NA 139 08/27/2010 0909   K 3.7 04/03/2013 0805   K 4.1 02/21/2013 0743   K 3.9 08/27/2010 0909   CL 101 02/21/2013 0743   CL 105 04/02/2012 0914   CL 99 08/27/2010 0909   CO2 27 04/03/2013 0805   CO2 28 02/21/2013 0743   CO2 31 08/27/2010 0909   BUN 11.8 04/03/2013 0805   BUN 11 02/21/2013 0743   BUN 13 08/27/2010 0909   CREATININE 1.0 04/03/2013 0805   CREATININE 0.96 02/21/2013 0743   CREATININE 1.2 08/27/2010 0909      Component Value Date/Time   CALCIUM 10.3 04/03/2013 0805   CALCIUM 9.9 02/21/2013 0743   CALCIUM 9.5 08/27/2010 0909   ALKPHOS 96 04/03/2013 0805   ALKPHOS 63 03/14/2012 0502   ALKPHOS 101* 08/27/2010 0909   AST 15 04/03/2013 0805   AST 22 03/14/2012 0502   AST 25 08/27/2010 0909   ALT 18 04/03/2013 0805   ALT 14 03/14/2012 0502   ALT 24 08/27/2010 0909   BILITOT 0.88 04/03/2013 0805   BILITOT 0.3 03/14/2012 0502   BILITOT 0.80 08/27/2010 0909     Results for MOLENE, MULLOY (MRN [id number]) as of 04/16/2013 15:12  Ref. Range 04/03/2013 08:04  CEA Latest Range: 0.0-5.0 ng/mL 0.6    Radiological Studies:  EXAM:  CT CHEST, ABDOMEN, AND PELVIS WITH CONTRAST  TECHNIQUE:  Multidetector CT imaging of the chest, abdomen and pelvis was  performed following the standard protocol during bolus  administration of intravenous contrast.  CONTRAST: 132mL OMNIPAQUE IOHEXOL 300 MG/ML SOLN  COMPARISON: 04/02/2012  FINDINGS:  CT CHEST FINDINGS  No suspicious pulmonary nodules. Mild linear scarring low  bronchiectasis in the bilateral lower lobes. Mild lingular scarring.  No pleural effusion or pneumothorax.  Dominant 2.9 cm complex left thyroid nodule (series 2/image 6),  mildly increased.  The heart is normal in size. No pericardial effusion.  No suspicious mediastinal, hilar, or  axillary lymphadenopathy.  Degenerative changes of the thoracic spine.  CT ABDOMEN AND PELVIS FINDINGS  7 mm probable cyst in the posterior segment right hepatic lobe  (series 2/image 21), unchanged.  Spleen, pancreas, and adrenal glands are within normal limits.  Gallbladder is unremarkable. No intrahepatic or extrahepatic ductal  dilatation.  Bilateral renal cysts, measuring up to 2.5 cm in the lateral right  upper kidney (series 4/ image 11). No hydronephrosis.  Status post right hemicolectomy with ileocolonic anastomosis.  No evidence of abdominal aortic aneurysm. Retro aortic left renal  vein.  No abdominopelvic ascites.  No suspicious abdominopelvic lymphadenopathy.  Status post hysterectomy. Right ovary is within normal limits. No  left adnexal mass.  Bladder is mildly thick-walled although underdistended.  Mild degenerative changes of the lower lumbar spine.  IMPRESSION:  Status post right hemicolectomy.  No evidence of recurrent or metastatic disease in the  chest,  abdomen, or pelvis.  Dominant 2.9 cm complex left thyroid nodule, mildly increased.  Consider thyroid ultrasound for further evaluation as clinically  warranted.  Impression and Plan:  79 year old female with the following issues:  1. Colon cancer. The patient is status post laparoscopic right hemicolectomy in May 2011. Her laboratory data CT scan results from 04/03/2013 was discussed and showed no evidence to suggest relapse or recurrent disease. The plan is to continue with observation and surveillance and followup in 12 months with a physical exam and recheck her CEA. I will repeat her CT scan as needed.  2. Hypertension. The patient is on amlodipine per PCP.  3. Hypothyroidism. The patient is on Synthroid per PCP.  4. Colonoscopy screening: She will be due in May of this year for a repeat colonoscopy     [facility] 2/3/20153:09 PM

## 2013-04-16 NOTE — Telephone Encounter (Signed)
gv pt appt schedule for feb 2016 °

## 2013-06-03 ENCOUNTER — Ambulatory Visit (HOSPITAL_COMMUNITY)
Admission: RE | Admit: 2013-06-03 | Discharge: 2013-06-03 | Disposition: A | Discharge status: Home / Self Care | Payer: Medicare Other | Source: Ambulatory Visit | Attending: Family Medicine | Admitting: Family Medicine

## 2013-06-03 ENCOUNTER — Other Ambulatory Visit (HOSPITAL_COMMUNITY): Payer: Self-pay | Admitting: Family Medicine

## 2013-06-03 DIAGNOSIS — M25519 Pain in unspecified shoulder: Secondary | ICD-10-CM | POA: Insufficient documentation

## 2013-06-03 DIAGNOSIS — M545 Low back pain, unspecified: Secondary | ICD-10-CM | POA: Insufficient documentation

## 2013-06-03 DIAGNOSIS — M542 Cervicalgia: Secondary | ICD-10-CM | POA: Insufficient documentation

## 2013-06-03 DIAGNOSIS — M5380 Other specified dorsopathies, site unspecified: Secondary | ICD-10-CM | POA: Insufficient documentation

## 2013-06-03 DIAGNOSIS — M47817 Spondylosis without myelopathy or radiculopathy, lumbosacral region: Secondary | ICD-10-CM | POA: Insufficient documentation

## 2013-06-03 DIAGNOSIS — M79605 Pain in left leg: Secondary | ICD-10-CM

## 2013-06-03 DIAGNOSIS — M25512 Pain in left shoulder: Secondary | ICD-10-CM

## 2013-07-23 ENCOUNTER — Encounter (INDEPENDENT_AMBULATORY_CARE_PROVIDER_SITE_OTHER): Payer: Self-pay

## 2013-07-23 ENCOUNTER — Encounter: Payer: Self-pay | Admitting: Gastroenterology

## 2013-07-23 ENCOUNTER — Other Ambulatory Visit: Payer: Self-pay | Admitting: Gastroenterology

## 2013-07-23 ENCOUNTER — Ambulatory Visit (INDEPENDENT_AMBULATORY_CARE_PROVIDER_SITE_OTHER): Payer: Medicare Other | Admitting: Gastroenterology

## 2013-07-23 VITALS — BP 142/84 | HR 87 | Temp 98.4°F | Ht 65.0 in | Wt 167.8 lb

## 2013-07-23 DIAGNOSIS — Z85038 Personal history of other malignant neoplasm of large intestine: Secondary | ICD-10-CM

## 2013-07-23 DIAGNOSIS — C189 Malignant neoplasm of colon, unspecified: Secondary | ICD-10-CM

## 2013-07-23 NOTE — Progress Notes (Signed)
cc'd to pcp 

## 2013-07-23 NOTE — Progress Notes (Signed)
Primary Care Physician:  Lanette Hampshire, MD  Primary Gastroenterologist:  Barney Drain, MD   Chief Complaint  Patient presents with  . Colonoscopy    HPI:  [name] is a 79 y.o. female with history of stage I colon cancer. Diagnosed in 2011. She is due for surveillance colonoscopy. She is doing well except for vertigo and and back pain. BM regular. No melena, brbpr. Appetite good. No unintentional weight loss. No GERD. No dysphagia. No abdominal pain.   Current Outpatient Prescriptions  Medication Sig Dispense Refill  . acetaminophen (TYLENOL) 500 MG tablet Take 1,000 mg by mouth every 8 (eight) hours as needed (cold).      Marland Kitchen amLODipine (NORVASC) 5 MG tablet Take 1 tablet by mouth daily.      . celecoxib (CELEBREX) 200 MG capsule Take 200 mg by mouth as needed. Arthritis pain      . DiphenhydrAMINE HCl (BENADRYL ALLERGY PO) Take 1 tablet by mouth 2 (two) times a week.      . levothyroxine (SYNTHROID, LEVOTHROID) 50 MCG tablet Take 50 mcg by mouth daily.        . Multiple Vitamin (MULTI-VITAMINS) TABS Take 1 tablet by mouth every other day.      . raloxifene (EVISTA) 60 MG tablet Take 60 mg by mouth daily.       . RESTASIS 0.05 % ophthalmic emulsion Place 1 drop into both eyes 2 (two) times daily.       . rosuvastatin (CRESTOR) 20 MG tablet Take 20 mg by mouth daily.        Baird Cancer ophthalmic ointment Apply 1 application topically 2 (two) times daily as needed.       . traMADol (ULTRAM) 50 MG tablet Take 50 mg by mouth every 6 (six) hours as needed.        No current facility-administered medications for this visit.    Allergies as of 07/23/2013  . (No Known Allergies)    Past Medical History  Diagnosis Date  . Hyperlipidemia   . HTN (hypertension)   . Hypothyroidism   . History of colon cancer 06/2009    found at time of TCS 06/29/09, 1.2cm sessile cecal polyp, no adjuvent therapy needed  . Colon cancer     colon ca dx 07/30/09  . Vertigo     Past Surgical  History  Procedure Laterality Date  . Colon surgery  07/2009    right hemicolectomy, no residual colon cancer on path  . Partial thyroidectomy      benign tumors  . Partial hysterectomy    . Colonoscopy  07/16/2010    [id number] POLYP-TCS 3 YEARS    Family History  Problem Relation Age of Onset  . Colon cancer Mother     >age60  . Heart attack Father   . Liver disease Neg Hx     History   Social History  . Marital Status: Widowed    Spouse Name: N/A    Number of Children: 2  . Years of Education: N/A   Occupational History  . volunteer at [employer]   . retired from [employer]    Social History Main Topics  . Smoking status: Never Smoker   . Smokeless tobacco: Never Used  . Alcohol Use: No  . Drug Use: No  . Sexual Activity: No   Other Topics Concern  . Not on file   Social History Narrative  . No narrative on file      ROS:  General: Negative  for anorexia, weight loss, fever, chills, fatigue, weakness. Eyes: Negative for vision changes.  ENT: Negative for hoarseness, difficulty swallowing , nasal congestion. CV: Negative for chest pain, angina, palpitations, dyspnea on exertion, peripheral edema.  Respiratory: Negative for dyspnea at rest, dyspnea on exertion, cough, sputum, wheezing.  GI: See history of present illness. GU:  Negative for dysuria, hematuria, urinary incontinence, urinary frequency, nocturnal urination.  MS: Negative for joint pain. Complains of low back pain.  Derm: Negative for rash or itching.  Neuro: Negative for weakness, abnormal sensation, seizure, frequent headaches, memory loss, confusion. Positive vertigo Psych: Negative for anxiety, depression, suicidal ideation, hallucinations.  Endo: Negative for unusual weight change.  Heme: Negative for bruising or bleeding. Allergy: Negative for rash or hives.    Physical Examination:  BP 142/84  Pulse 87  Temp(Src) 98.4 F (36.9 C) (Oral)  Ht 5\' 5"  (1.651 m)  Wt 167 lb 12.8 oz  (76.114 kg)  BMI 27.92 kg/m2   General: Well-nourished, well-developed in no acute distress.  Head: Normocephalic, atraumatic.   Eyes: Conjunctiva pink, no icterus. Mouth: Oropharyngeal mucosa moist and pink , no lesions erythema or exudate. Neck: Supple without thyromegaly, masses, or lymphadenopathy.  Lungs: Clear to auscultation bilaterally.  Heart: Regular rate and rhythm, no murmurs rubs or gallops.  Abdomen: Bowel sounds are normal, nontender, nondistended, no hepatosplenomegaly or masses, no abdominal bruits or    hernia , no rebound or guarding.   Rectal: Deferred Extremities: No lower extremity edema. No clubbing or deformities.  Neuro: Alert and oriented x 4 , grossly normal neurologically.  Skin: Warm and dry, no rash or jaundice.   Psych: Alert and cooperative, normal mood and affect.  Labs: Lab Results  Component Value Date   CREATININE 1.0 04/03/2013   BUN 11.8 04/03/2013   NA 144 04/03/2013   K 3.7 04/03/2013   CL 101 02/21/2013   CO2 27 04/03/2013   Lab Results  Component Value Date   ALT 18 04/03/2013   AST 15 04/03/2013   ALKPHOS 96 04/03/2013   BILITOT 0.88 04/03/2013   Lab Results  Component Value Date   CEA 0.6 04/03/2013     Imaging Studies: No results found.

## 2013-07-23 NOTE — Patient Instructions (Signed)
Colonoscopy as scheduled. See separate instructions.  

## 2013-07-23 NOTE — Assessment & Plan Note (Signed)
78er lady with history of stage I colon cancer who presents for surveillance colonoscopy. Original diagnosis in 2011. Last colonoscopy 2012. Asymptomatic at this time. No GI complaints.  I have discussed the risks, alternatives, benefits with regards to but not limited to the risk of reaction to medication, bleeding, infection, perforation and the patient is agreeable to proceed. Written consent to be obtained.

## 2013-07-24 ENCOUNTER — Encounter (HOSPITAL_COMMUNITY): Payer: Self-pay | Admitting: Pharmacy Technician

## 2013-08-02 ENCOUNTER — Encounter (HOSPITAL_COMMUNITY)
Admission: RE | Disposition: A | Discharge status: Home / Self Care | Payer: Self-pay | Source: Ambulatory Visit | Attending: Gastroenterology

## 2013-08-02 ENCOUNTER — Ambulatory Visit (HOSPITAL_COMMUNITY)
Admission: RE | Admit: 2013-08-02 | Discharge: 2013-08-02 | Disposition: A | Discharge status: Home / Self Care | Payer: Medicare Other | Source: Ambulatory Visit | Attending: Gastroenterology | Admitting: Gastroenterology

## 2013-08-02 ENCOUNTER — Encounter (HOSPITAL_COMMUNITY): Payer: Self-pay | Admitting: *Deleted

## 2013-08-02 DIAGNOSIS — E039 Hypothyroidism, unspecified: Secondary | ICD-10-CM | POA: Insufficient documentation

## 2013-08-02 DIAGNOSIS — E785 Hyperlipidemia, unspecified: Secondary | ICD-10-CM | POA: Insufficient documentation

## 2013-08-02 DIAGNOSIS — D126 Benign neoplasm of colon, unspecified: Secondary | ICD-10-CM

## 2013-08-02 DIAGNOSIS — Z09 Encounter for follow-up examination after completed treatment for conditions other than malignant neoplasm: Secondary | ICD-10-CM | POA: Insufficient documentation

## 2013-08-02 DIAGNOSIS — K5730 Diverticulosis of large intestine without perforation or abscess without bleeding: Secondary | ICD-10-CM | POA: Insufficient documentation

## 2013-08-02 DIAGNOSIS — I10 Essential (primary) hypertension: Secondary | ICD-10-CM | POA: Insufficient documentation

## 2013-08-02 DIAGNOSIS — Z79899 Other long term (current) drug therapy: Secondary | ICD-10-CM | POA: Insufficient documentation

## 2013-08-02 DIAGNOSIS — Z85038 Personal history of other malignant neoplasm of large intestine: Secondary | ICD-10-CM

## 2013-08-02 HISTORY — PX: COLONOSCOPY: SHX5424

## 2013-08-02 SURGERY — COLONOSCOPY
Anesthesia: Moderate Sedation

## 2013-08-02 MED ORDER — SODIUM CHLORIDE 0.9 % IV SOLN
INTRAVENOUS | Status: DC
  Administered 2013-08-02: 1000 mL via INTRAVENOUS

## 2013-08-02 MED ORDER — STERILE WATER FOR IRRIGATION IR SOLN
Status: DC | PRN
  Administered 2013-08-02: 10:00:00

## 2013-08-02 MED ORDER — MEPERIDINE HCL 100 MG/ML IJ SOLN
INTRAMUSCULAR | Status: AC
  Filled 2013-08-02: qty 2

## 2013-08-02 MED ORDER — MEPERIDINE HCL 100 MG/ML IJ SOLN
INTRAMUSCULAR | Status: DC | PRN
  Administered 2013-08-02 (×2): 25 mg via INTRAVENOUS

## 2013-08-02 MED ORDER — MIDAZOLAM HCL 5 MG/5ML IJ SOLN
INTRAMUSCULAR | Status: AC
  Filled 2013-08-02: qty 10

## 2013-08-02 MED ORDER — MIDAZOLAM HCL 5 MG/5ML IJ SOLN
INTRAMUSCULAR | Status: DC | PRN
  Administered 2013-08-02: 1 mg via INTRAVENOUS
  Administered 2013-08-02 (×2): 2 mg via INTRAVENOUS

## 2013-08-02 NOTE — H&P (Signed)
Primary Care Physician:  Lanette Hampshire, MD Primary Gastroenterologist:  Dr. Oneida Alar  Pre-Procedure History & Physical: HPI:  [name] is a 79 y.o. female here for PERSONAL HISTORY OF colon cancer.  Past Medical History  Diagnosis Date  . Hyperlipidemia   . HTN (hypertension)   . Hypothyroidism   . History of colon cancer 06/2009    found at time of TCS 06/29/09, 1.2cm sessile cecal polyp, no adjuvent therapy needed  . Colon cancer     colon ca dx 07/30/09  . Vertigo    Past Surgical History  Procedure Laterality Date  . Colon surgery  07/2009    right hemicolectomy, no residual colon cancer on path  . Partial thyroidectomy      benign tumors  . Partial hysterectomy    . Colonoscopy  07/16/2010    [id number] POLYP-TCS 3 YEARS   Prior to Admission medications   Medication Sig Start Date End Date Taking? Authorizing Provider  acetaminophen (TYLENOL) 500 MG tablet Take 1,000 mg by mouth every 8 (eight) hours as needed (cold).   Yes Historical Provider, MD  amLODipine (NORVASC) 5 MG tablet Take 1 tablet by mouth daily. 06/28/10  Yes Historical Provider, MD  celecoxib (CELEBREX) 200 MG capsule Take 200 mg by mouth as needed. Arthritis pain   Yes Historical Provider, MD  DiphenhydrAMINE HCl (BENADRYL ALLERGY PO) Take 1 tablet by mouth 2 (two) times a week.   Yes Historical Provider, MD  levothyroxine (SYNTHROID, LEVOTHROID) 50 MCG tablet Take 50 mcg by mouth daily.     Yes Historical Provider, MD  Multiple Vitamin (MULTI-VITAMINS) TABS Take 1 tablet by mouth every other day.   Yes Historical Provider, MD  RESTASIS 0.05 % ophthalmic emulsion Place 1 drop into both eyes 2 (two) times daily.  05/17/10  Yes Historical Provider, MD  rosuvastatin (CRESTOR) 20 MG tablet Take 20 mg by mouth daily.     Yes Historical Provider, MD  TOBRADEX ophthalmic ointment Apply 1 application topically 2 (two) times daily as needed.  06/11/10  Yes Historical Provider, MD  traMADol (ULTRAM) 50 MG  tablet Take 50 mg by mouth every 6 (six) hours as needed for moderate pain.  06/07/13  Yes Historical Provider, MD  raloxifene (EVISTA) 60 MG tablet Take 60 mg by mouth daily.     Historical Provider, MD    Allergies as of 07/23/2013  . (No Known Allergies)    Family History  Problem Relation Age of Onset  . Colon cancer Mother     >age60  . Heart attack Father   . Liver disease Neg Hx     History   Social History  . Marital Status: Widowed    Spouse Name: N/A    Number of Children: 2  . Years of Education: N/A   Occupational History  . volunteer at [employer]   . retired from [employer]    Social History Main Topics  . Smoking status: Never Smoker   . Smokeless tobacco: Never Used  . Alcohol Use: No  . Drug Use: No  . Sexual Activity: No   Other Topics Concern  . Not on file   Social History Narrative  . No narrative on file    Review of Systems: See HPI, otherwise negative ROS   Physical Exam: BP 141/70  Pulse 90  Resp 20  SpO2 96% General:   Alert,  pleasant and cooperative in NAD Head:  Normocephalic and atraumatic. Neck:  Supple; Lungs:  Clear throughout to  auscultation.    Heart:  Regular rate and rhythm. Abdomen:  Soft, nontender and nondistended. Normal bowel sounds, without guarding, and without rebound.   Neurologic:  Alert and  oriented x4;  grossly normal neurologically.  Impression/Plan:    PERSONAL HISTORY OF colon cancer: 2011.  PLAN: 1. TCS TODAY

## 2013-08-02 NOTE — Op Note (Signed)
[facility] [address] [place], [address]   COLONOSCOPY PROCEDURE REPORT  PATIENT: Nicole, Bailey  MR#: [id number] BIRTHDATE: 02-07-1935 , 78  yrs. old GENDER: Female ENDOSCOPIST: Barney Drain, MD REFERRED MV:[id number] Everette Rank, M.D. PROCEDURE DATE:  08/02/2013 PROCEDURE:   Colonoscopy with cold biopsy polypectomy INDICATIONS:High risk patient with personal history of colon cancer. PSHX: R HEMICOLECTOMY 2011 MEDICATIONS: Demerol 50 mg IV and Versed 5 mg IV  DESCRIPTION OF PROCEDURE:    Physical exam was performed.  Informed consent was obtained from the patient after explaining the benefits, risks, and alternatives to procedure.  The patient was connected to monitor and placed in left lateral position. Continuous oxygen was provided by nasal cannula and IV medicine administered through an indwelling cannula.  After administration of sedation and rectal exam, the patients rectum was intubated and the EC-[id number] ([id number])  colonoscope was advanced under direct visualization to the ileum.  The scope was removed slowly by carefully examining the color, texture, anatomy, and integrity mucosa on the way out.  The patient was recovered in endoscopy and discharged home in satisfactory condition.    COLON FINDINGS: NORMAL ILEUM AND ANASTOMOSIS. Nine flat polyps measuring 2-4 mm in size were found in the sigmoid colon and rectum.  A polypectomy was performed with cold forceps.  , Mild diverticulosis was noted in the sigmoid colon.  , OTHERWISE The mucosa appeared normal in the [facility].  PREP QUALITY: good.  CECAL W/D TIME: 12 minutes     COMPLICATIONS: None  ENDOSCOPIC IMPRESSION: 1.   Nine COLON polyps REMOVED 2.   Mild diverticulosis  in the sigmoid colon 3.   Normal ANASTOMOSIS & terminal ileum   RECOMMENDATIONS: FOLLOW A HIGH FIBER DIET.  AVOID ITEMS THAT CAUSE BLOATING.  SEE INFO BELOW. BIOPSY RESULTS SHOULD BE BACK IN 7 DAYS.  Next  colonoscopy in 5 years.       _______________________________ Lorrin MaisBarney Drain, MD 08/02/2013 11:06 AM

## 2013-08-02 NOTE — Progress Notes (Signed)
REVIEWED.  

## 2013-08-02 NOTE — Discharge Instructions (Signed)
You had 9 small polyps removed. You have diverticulosis IN YOUR LEFT COLON.   FOLLOW A HIGH FIBER DIET. AVOID ITEMS THAT CAUSE BLOATING. SEE INFO BELOW.  YOUR BIOPSY RESULTS SHOULD BE BACK IN 7 DAYS.  Next colonoscopy in 5 years.   Colonoscopy Care After Read the instructions outlined below and refer to this sheet in the next week. These discharge instructions provide you with general information on caring for yourself after you leave the hospital. While your treatment has been planned according to the most current medical practices available, unavoidable complications occasionally occur. If you have any problems or questions after discharge, call DR. Telesia Ates, [phone].  ACTIVITY  You may resume your regular activity, but move at a slower pace for the next 24 hours.   Take frequent rest periods for the next 24 hours.   Walking will help get rid of the air and reduce the bloated feeling in your belly (abdomen).   No driving for 24 hours (because of the medicine (anesthesia) used during the test).   You may shower.   Do not sign any important legal documents or operate any machinery for 24 hours (because of the anesthesia used during the test).    NUTRITION  Drink plenty of fluids.   You may resume your normal diet as instructed by your doctor.   Begin with a light meal and progress to your normal diet. Heavy or fried foods are harder to digest and may make you feel sick to your stomach (nauseated).   Avoid alcoholic beverages for 24 hours or as instructed.    MEDICATIONS  You may resume your normal medications.   WHAT YOU CAN EXPECT TODAY  Some feelings of bloating in the abdomen.   Passage of more gas than usual.   Spotting of blood in your stool or on the toilet paper  .  IF YOU HAD POLYPS REMOVED DURING THE COLONOSCOPY:  Eat a soft diet IF YOU HAVE NAUSEA, BLOATING, ABDOMINAL PAIN, OR VOMITING.    FINDING OUT THE RESULTS OF YOUR TEST Not all test  results are available during your visit. DR. Oneida Alar WILL CALL YOU WITHIN 7 DAYS OF YOUR PROCEDUE WITH YOUR RESULTS. Do not assume everything is normal if you have not heard from DR. Batina Dougan IN ONE WEEK, CALL HER OFFICE AT [phone].  SEEK IMMEDIATE MEDICAL ATTENTION AND CALL THE OFFICE: [phone] IF:  You have more than a spotting of blood in your stool.   Your belly is swollen (abdominal distention).   You are nauseated or vomiting.   You have a temperature over 101F.   You have abdominal pain or discomfort that is severe or gets worse throughout the day.  Polyps, Colon  A polyp is extra tissue that grows inside your body. Colon polyps grow in the large intestine. The large intestine, also called the colon, is part of your digestive system. It is a long, hollow tube at the end of your digestive tract where your body makes and stores stool. Most polyps are not dangerous. They are benign. This means they are not cancerous. But over time, some types of polyps can turn into cancer. Polyps that are smaller than a pea are usually not harmful. But larger polyps could someday become or may already be cancerous. To be safe, doctors remove all polyps and test them.   WHO GETS POLYPS? Anyone can get polyps, but certain people are more likely than others. You may have a greater chance of getting polyps if:  You are over 50.   You have had polyps before.   Someone in your family has had polyps.   Someone in your family has had cancer of the large intestine.   Find out if someone in your family has had polyps. You may also be more likely to get polyps if you:   Eat a lot of fatty foods   Smoke   Drink alcohol   Do not exercise  Eat too much   TREATMENT  The caregiver will remove the polyp during sigmoidoscopy or colonoscopy.  PREVENTION There is not one sure way to prevent polyps. You might be able to lower your risk of getting them if you:  Eat more fruits and vegetables and less  fatty food.   Do not smoke.   Avoid alcohol.   Exercise every day.   Lose weight if you are overweight.   Eating more calcium and folate can also lower your risk of getting polyps. Some foods that are rich in calcium are milk, cheese, and broccoli. Some foods that are rich in folate are chickpeas, kidney beans, and spinach.   High-Fiber Diet A high-fiber diet changes your normal diet to include more whole grains, legumes, fruits, and vegetables. Changes in the diet involve replacing refined carbohydrates with unrefined foods. The calorie level of the diet is essentially unchanged. The Dietary Reference Intake (recommended amount) for adult males is 38 grams per day. For adult females, it is 25 grams per day. Pregnant and lactating women should consume 28 grams of fiber per day. Fiber is the intact part of a plant that is not broken down during digestion. Functional fiber is fiber that has been isolated from the plant to provide a beneficial effect in the body. PURPOSE  Increase stool bulk.   Ease and regulate bowel movements.   Lower cholesterol.  INDICATIONS THAT YOU NEED MORE FIBER  Constipation and hemorrhoids.   Uncomplicated diverticulosis (intestine condition) and irritable bowel syndrome.   Weight management.   As a protective measure against hardening of the arteries (atherosclerosis), diabetes, and cancer.   GUIDELINES FOR INCREASING FIBER IN THE DIET  Start adding fiber to the diet slowly. A gradual increase of about 5 more grams (2 slices of whole-wheat bread, 2 servings of most fruits or vegetables, or 1 bowl of high-fiber cereal) per day is best. Too rapid an increase in fiber may result in constipation, flatulence, and bloating.   Drink enough water and fluids to keep your urine clear or pale yellow. Water, juice, or caffeine-free drinks are recommended. Not drinking enough fluid may cause constipation.   Eat a variety of high-fiber foods rather than one type of  fiber.   Try to increase your intake of fiber through using high-fiber foods rather than fiber pills or supplements that contain small amounts of fiber.   The goal is to change the types of food eaten. Do not supplement your present diet with high-fiber foods, but replace foods in your present diet.  INCLUDE A VARIETY OF FIBER SOURCES  Replace refined and processed grains with whole grains, canned fruits with fresh fruits, and incorporate other fiber sources. White rice, white breads, and most bakery goods contain little or no fiber.   Brown whole-grain rice, buckwheat oats, and many fruits and vegetables are all good sources of fiber. These include: broccoli, Brussels sprouts, cabbage, cauliflower, beets, sweet potatoes, white potatoes (skin on), carrots, tomatoes, eggplant, squash, berries, fresh fruits, and dried fruits.   Cereals appear to be  the richest source of fiber. Cereal fiber is found in whole grains and bran. Bran is the fiber-rich outer coat of cereal grain, which is largely removed in refining. In whole-grain cereals, the bran remains. In breakfast cereals, the largest amount of fiber is found in those with "bran" in their names. The fiber content is sometimes indicated on the label.   You may need to include additional fruits and vegetables each day.   In baking, for 1 cup white flour, you may use the following substitutions:   1 cup whole-wheat flour minus 2 tablespoons.   1/2 cup white flour plus 1/2 cup whole-wheat flour.   Diverticulosis Diverticulosis is a common condition that develops when small pouches (diverticula) form in the wall of the colon. The risk of diverticulosis increases with age. It happens more often in people who eat a low-fiber diet. Most individuals with diverticulosis have no symptoms. Those individuals with symptoms usually experience belly (abdominal) pain, constipation, or loose stools (diarrhea).  HOME CARE INSTRUCTIONS  Increase the amount of  fiber in your diet as directed by your caregiver or dietician. This may reduce symptoms of diverticulosis.   Drink at least 6 to 8 glasses of water each day to prevent constipation.   Try not to strain when you have a bowel movement.   Avoiding nuts and seeds to prevent complications is still an uncertain benefit.       FOODS HAVING HIGH FIBER CONTENT INCLUDE:  Fruits. Apple, peach, pear, tangerine, raisins, prunes.   Vegetables. Brussels sprouts, asparagus, broccoli, cabbage, carrot, cauliflower, romaine lettuce, spinach, summer squash, tomato, winter squash, zucchini.   Starchy Vegetables. Baked beans, kidney beans, lima beans, split peas, lentils, potatoes (with skin).   Grains. Whole wheat bread, brown rice, bran flake cereal, plain oatmeal, white rice, shredded wheat, bran muffins.    SEEK IMMEDIATE MEDICAL CARE IF:  You develop increasing pain or severe bloating.   You have an oral temperature above 101F.   You develop vomiting or bowel movements that are bloody or black.

## 2013-08-07 ENCOUNTER — Encounter (HOSPITAL_COMMUNITY): Payer: Self-pay | Admitting: Gastroenterology

## 2013-08-20 ENCOUNTER — Telehealth: Payer: Self-pay | Admitting: Gastroenterology

## 2013-08-20 NOTE — Telephone Encounter (Signed)
Called and informed pt.  

## 2013-08-20 NOTE — Telephone Encounter (Signed)
Please call pt. She had HYPERPLASTIC POLYPS removed.    FOLLOW A HIGH FIBER DIET. AVOID ITEMS THAT CAUSE BLOATING.   Next colonoscopy in 5 years IF THE BENEFITS OUTWEIGH THE RISKS.[name]

## 2013-09-16 ENCOUNTER — Ambulatory Visit (HOSPITAL_COMMUNITY)
Admission: RE | Admit: 2013-09-16 | Discharge: 2013-09-16 | Disposition: A | Discharge status: Home / Self Care | Payer: Medicare Other | Source: Ambulatory Visit | Attending: Family Medicine | Admitting: Family Medicine

## 2013-09-16 ENCOUNTER — Other Ambulatory Visit (HOSPITAL_COMMUNITY): Payer: Self-pay | Admitting: Family Medicine

## 2013-09-16 DIAGNOSIS — R0609 Other forms of dyspnea: Secondary | ICD-10-CM | POA: Insufficient documentation

## 2013-09-16 DIAGNOSIS — R0989 Other specified symptoms and signs involving the circulatory and respiratory systems: Secondary | ICD-10-CM | POA: Insufficient documentation

## 2013-09-16 DIAGNOSIS — R072 Precordial pain: Secondary | ICD-10-CM

## 2013-09-16 DIAGNOSIS — R079 Chest pain, unspecified: Secondary | ICD-10-CM | POA: Insufficient documentation

## 2013-11-19 ENCOUNTER — Other Ambulatory Visit (HOSPITAL_COMMUNITY): Payer: Self-pay | Admitting: Family Medicine

## 2013-11-19 DIAGNOSIS — Z1231 Encounter for screening mammogram for malignant neoplasm of breast: Secondary | ICD-10-CM

## 2013-11-27 ENCOUNTER — Ambulatory Visit (HOSPITAL_COMMUNITY)
Admission: RE | Admit: 2013-11-27 | Discharge: 2013-11-27 | Disposition: A | Discharge status: Home / Self Care | Payer: Medicare Other | Source: Ambulatory Visit | Attending: Family Medicine | Admitting: Family Medicine

## 2013-11-27 DIAGNOSIS — Z1231 Encounter for screening mammogram for malignant neoplasm of breast: Secondary | ICD-10-CM | POA: Diagnosis not present

## 2014-04-15 ENCOUNTER — Ambulatory Visit (HOSPITAL_BASED_OUTPATIENT_CLINIC_OR_DEPARTMENT_OTHER): Payer: Medicare Other | Admitting: Oncology

## 2014-04-15 ENCOUNTER — Other Ambulatory Visit (HOSPITAL_BASED_OUTPATIENT_CLINIC_OR_DEPARTMENT_OTHER): Payer: Medicare Other

## 2014-04-15 ENCOUNTER — Telehealth: Payer: Self-pay | Admitting: Oncology

## 2014-04-15 VITALS — BP 148/80 | HR 85 | Temp 98.4°F | Resp 18 | Ht 65.0 in | Wt 169.0 lb

## 2014-04-15 DIAGNOSIS — C189 Malignant neoplasm of colon, unspecified: Secondary | ICD-10-CM

## 2014-04-15 DIAGNOSIS — I10 Essential (primary) hypertension: Secondary | ICD-10-CM

## 2014-04-15 DIAGNOSIS — Z85038 Personal history of other malignant neoplasm of large intestine: Secondary | ICD-10-CM

## 2014-04-15 DIAGNOSIS — E039 Hypothyroidism, unspecified: Secondary | ICD-10-CM

## 2014-04-15 LAB — COMPREHENSIVE METABOLIC PANEL (CC13)
ALK PHOS: 98 U/L (ref 40–150)
ALT: 14 U/L (ref 0–55)
AST: 14 U/L (ref 5–34)
Albumin: 3.9 g/dL (ref 3.5–5.0)
Anion Gap: 9 mEq/L (ref 3–11)
BUN: 13.4 mg/dL (ref 7.0–26.0)
CALCIUM: 9.4 mg/dL (ref 8.4–10.4)
CO2: 28 meq/L (ref 22–29)
Chloride: 107 mEq/L (ref 98–109)
Creatinine: 1.3 mg/dL — ABNORMAL HIGH (ref 0.6–1.1)
EGFR: 47 mL/min/{1.73_m2} — AB (ref >90)
GLUCOSE: 107 mg/dL (ref 70–140)
POTASSIUM: 4 meq/L (ref 3.5–5.1)
SODIUM: 143 meq/L (ref 136–145)
Total Bilirubin: 0.69 mg/dL (ref 0.20–1.20)
Total Protein: 7.8 g/dL (ref 6.4–8.3)

## 2014-04-15 LAB — CBC WITH DIFFERENTIAL/PLATELET
BASO%: 0.4 % (ref 0.0–2.0)
BASOS ABS: 0 10*3/uL (ref 0.0–0.1)
EOS%: 2.6 % (ref 0.0–7.0)
Eosinophils Absolute: 0.1 10*3/uL (ref 0.0–0.5)
HEMATOCRIT: 43.9 % (ref 34.8–46.6)
HGB: 14.4 g/dL (ref 11.6–15.9)
LYMPH#: 1.9 10*3/uL (ref 0.9–3.3)
LYMPH%: 34.6 % (ref 14.0–49.7)
MCH: 30.9 pg (ref 25.1–34.0)
MCHC: 32.8 g/dL (ref 31.5–36.0)
MCV: 94.2 fL (ref 79.5–101.0)
MONO#: 0.3 10*3/uL (ref 0.1–0.9)
MONO%: 5.6 % (ref 0.0–14.0)
NEUT%: 56.8 % (ref 38.4–76.8)
NEUTROS ABS: 3.1 10*3/uL (ref 1.5–6.5)
Platelets: 352 10*3/uL (ref 145–400)
RBC: 4.66 10*6/uL (ref 3.70–5.45)
RDW: 14 % (ref 11.2–14.5)
WBC: 5.4 10*3/uL (ref 3.9–10.3)
nRBC: 0 % (ref 0–0)

## 2014-04-15 LAB — CEA: CEA: <0.5 ng/mL (ref 0.0–5.0)

## 2014-04-15 NOTE — Progress Notes (Signed)
Hematology and Oncology Follow Up Visit  CERIAH KOHLER [id number] [birth year] 80 y.o. 04/15/2014 8:35 AM Lanette Hampshire, MDMcInnis, Starr Sinclair, MD   Principle Diagnosis: 80 year old with Stage I colon cancer diagnosed in 2011.  Prior Therapy: The patient is status post laparoscopic right hemicolectomy with lysis of adhesions on 07/31/2009 for an adenocarcinoma within a polyp with 0 of 25 lymph nodes sampled having evidence of malignancy.  Current therapy: Watchful observation.  Interim History:  Ms. Ratti returns for routine followup visit. Since her last visit, she had been doing very well without any major issues. She she is continuing to deal with vertigo which has improved in the last 12 months. Her appetite is good and her weight is stable compared to 12 months ago. She denies any rectal bleeding. No change in her bowel function. No bowel pain, nausea, vomiting. She does not report any headaches or blurry vision. She has not reported any falls. She does not report any chest pain palpitation or orthopnea. Does not report any cough or hemoptysis. Does not report any frequency urgency or hesitancy. Rest of her review of systems unremarkable.   Medications: I have reviewed the patient's current medications.   Current Outpatient Prescriptions  Medication Sig Dispense Refill  . acetaminophen (TYLENOL) 500 MG tablet Take 1,000 mg by mouth every 8 (eight) hours as needed (cold).    Marland Kitchen amLODipine (NORVASC) 5 MG tablet Take 1 tablet by mouth daily.    . celecoxib (CELEBREX) 200 MG capsule Take 200 mg by mouth as needed. Arthritis pain    . DiphenhydrAMINE HCl (BENADRYL ALLERGY PO) Take 1 tablet by mouth 2 (two) times a week.    . levothyroxine (SYNTHROID, LEVOTHROID) 50 MCG tablet Take 50 mcg by mouth daily.      . Multiple Vitamin (MULTI-VITAMINS) TABS Take 1 tablet by mouth every other day.    . raloxifene (EVISTA) 60 MG tablet Take 60 mg by mouth daily.     . RESTASIS 0.05 %  ophthalmic emulsion Place 1 drop into both eyes 2 (two) times daily.     . rosuvastatin (CRESTOR) 20 MG tablet Take 20 mg by mouth daily.      Baird Cancer ophthalmic ointment Apply 1 application topically 2 (two) times daily as needed.     . traMADol (ULTRAM) 50 MG tablet Take 50 mg by mouth every 6 (six) hours as needed for moderate pain.      No current facility-administered medications for this visit.    Allergies: No Known Allergies  Past Medical History, Surgical history, Social history, and Family History were reviewed and updated.    Physical Exam: Blood pressure 148/80, pulse 85, temperature 98.4 F (36.9 C), temperature source Oral, resp. rate 18, height 5\' 5"  (1.651 m), weight 169 lb (76.658 kg), SpO2 99 %. ECOG: 1 General appearance: alert and cooperative Head: Normocephalic, without obvious abnormality, atraumatic Neck: no adenopathy Lymph nodes: Cervical, supraclavicular, and axillary nodes normal. Heart:regular rate and rhythm, S1, S2 normal, no murmur, click, rub or gallop Lung:chest clear, no wheezing, rales, normal symmetric air entry, no tachypnea, retractions or cyanosis Abdomin: soft, non-tender, without masses or organomegaly EXT:no erythema, induration, or nodules   Lab Results: Lab Results  Component Value Date   WBC 5.4 04/15/2014   HGB 14.4 04/15/2014   HCT 43.9 04/15/2014   MCV 94.2 04/15/2014   PLT 352 04/15/2014     Chemistry      Component Value Date/Time   NA 144 04/03/2013 0805  NA 140 02/21/2013 0743   NA 139 08/27/2010 0909   K 3.7 04/03/2013 0805   K 4.1 02/21/2013 0743   K 3.9 08/27/2010 0909   CL 101 02/21/2013 0743   CL 105 04/02/2012 0914   CL 99 08/27/2010 0909   CO2 27 04/03/2013 0805   CO2 28 02/21/2013 0743   CO2 31 08/27/2010 0909   BUN 11.8 04/03/2013 0805   BUN 11 02/21/2013 0743   BUN 13 08/27/2010 0909   CREATININE 1.0 04/03/2013 0805   CREATININE 0.96 02/21/2013 0743   CREATININE 1.2 08/27/2010 0909       Component Value Date/Time   CALCIUM 10.3 04/03/2013 0805   CALCIUM 9.9 02/21/2013 0743   CALCIUM 9.5 08/27/2010 0909   ALKPHOS 96 04/03/2013 0805   ALKPHOS 63 03/14/2012 0502   ALKPHOS 101* 08/27/2010 0909   AST 15 04/03/2013 0805   AST 22 03/14/2012 0502   AST 25 08/27/2010 0909   ALT 18 04/03/2013 0805   ALT 14 03/14/2012 0502   ALT 24 08/27/2010 0909   BILITOT 0.88 04/03/2013 0805   BILITOT 0.3 03/14/2012 0502   BILITOT 0.80 08/27/2010 0909     Impression and Plan:  80 year old female with the following issues:  1. Colon cancer. The patient is status post laparoscopic right hemicolectomy in May 2011. Her laboratory data CT scan results from 04/03/2013  showed no evidence to suggest relapse or recurrent disease. The plan is to continue with observation and surveillance and followup in 12 months with a physical exam and recheck her CEA. I will repeat her CT scan as needed. I do not think she needs any further oncology follow-up p.m. 2017.  2. Hypertension. The patient is on amlodipine per PCP.  3. Hypothyroidism. The patient is on Synthroid per PCP.  4. Colonoscopy screening: She is up-to-date and completed a colonoscopy 2015. She will likely have that repeated in 3-5 years.     Mariaha Ellington 2/2/20168:35 AM

## 2014-04-15 NOTE — Telephone Encounter (Signed)
Pt confirmed labs/ov per 02/02 POF, gave pt AVS.... KJ °

## 2014-04-15 NOTE — Addendum Note (Signed)
Addended by: Amelia Jo I on: 04/15/2014 08:52 AM   Modules accepted: Orders, Medications

## 2014-09-25 ENCOUNTER — Ambulatory Visit (INDEPENDENT_AMBULATORY_CARE_PROVIDER_SITE_OTHER): Payer: Medicare Other | Admitting: Otolaryngology

## 2014-09-25 DIAGNOSIS — H8111 Benign paroxysmal vertigo, right ear: Secondary | ICD-10-CM

## 2014-11-25 ENCOUNTER — Other Ambulatory Visit (HOSPITAL_COMMUNITY): Payer: Self-pay | Admitting: Family Medicine

## 2014-11-25 DIAGNOSIS — Z1231 Encounter for screening mammogram for malignant neoplasm of breast: Secondary | ICD-10-CM

## 2014-12-01 ENCOUNTER — Ambulatory Visit (HOSPITAL_COMMUNITY)
Admission: RE | Admit: 2014-12-01 | Discharge: 2014-12-01 | Disposition: A | Discharge status: Home / Self Care | Payer: Medicare Other | Source: Ambulatory Visit | Attending: Family Medicine | Admitting: Family Medicine

## 2014-12-01 DIAGNOSIS — Z1231 Encounter for screening mammogram for malignant neoplasm of breast: Secondary | ICD-10-CM | POA: Diagnosis present

## 2014-12-11 ENCOUNTER — Ambulatory Visit (INDEPENDENT_AMBULATORY_CARE_PROVIDER_SITE_OTHER): Payer: Medicare Other | Admitting: Otolaryngology

## 2014-12-11 DIAGNOSIS — R42 Dizziness and giddiness: Secondary | ICD-10-CM

## 2014-12-11 DIAGNOSIS — H8112 Benign paroxysmal vertigo, left ear: Secondary | ICD-10-CM | POA: Diagnosis not present

## 2015-01-08 ENCOUNTER — Ambulatory Visit (INDEPENDENT_AMBULATORY_CARE_PROVIDER_SITE_OTHER): Payer: Medicare Other | Admitting: Otolaryngology

## 2015-01-08 DIAGNOSIS — H8111 Benign paroxysmal vertigo, right ear: Secondary | ICD-10-CM | POA: Diagnosis not present

## 2015-02-04 ENCOUNTER — Ambulatory Visit (INDEPENDENT_AMBULATORY_CARE_PROVIDER_SITE_OTHER): Payer: Medicare Other | Admitting: Cardiovascular Disease

## 2015-02-04 ENCOUNTER — Encounter: Payer: Self-pay | Admitting: Cardiovascular Disease

## 2015-02-04 VITALS — BP 150/74 | HR 98 | Ht 63.0 in | Wt 167.1 lb

## 2015-02-04 DIAGNOSIS — Z136 Encounter for screening for cardiovascular disorders: Secondary | ICD-10-CM

## 2015-02-04 DIAGNOSIS — R42 Dizziness and giddiness: Secondary | ICD-10-CM | POA: Diagnosis not present

## 2015-02-04 DIAGNOSIS — I10 Essential (primary) hypertension: Secondary | ICD-10-CM

## 2015-02-04 DIAGNOSIS — R9431 Abnormal electrocardiogram [ECG] [EKG]: Secondary | ICD-10-CM

## 2015-02-04 NOTE — Patient Instructions (Signed)
Your physician recommends that you schedule a follow-up appointment in: as needed   Thank you for choosing [facility] !         

## 2015-02-04 NOTE — Progress Notes (Signed)
Patient ID: [name], female   DOB: 10-31-1934, 80 y.o.   MRN: [id number]          CARDIOLOGY CONSULT NOTE  Patient ID: [name] MRN: [id number] DOB/AGE: [birth year] 80 y.o.  Admit date: (Not on file) Primary Physician Lanette Hampshire, MD  Reason for Consultation: vertigo  HPI: The patient is a 80 year old woman with a history of hypertension and hyperlipidemia who suffers from vertigo. She first developed this approximately 2 years ago. She denies chest pain, leg swelling, and shortness of breath, as well as syncope. She feels that her heart sometimes races after eating sugar laden foods. She has previously had her orthostatics checked by her PCP and she says they were normal. She is here with her daughter. ECG performed in the office today demonstrates sinus rhythm with possible old inferior infarct.   Fam: She denies a personal history of heart disease but says that her mother and father had heart disease.    No Known Allergies  Current Outpatient Prescriptions  Medication Sig Dispense Refill  . acetaminophen (TYLENOL) 500 MG tablet Take 1,000 mg by mouth every 8 (eight) hours as needed (cold).    Marland Kitchen amLODipine (NORVASC) 5 MG tablet Take 1 tablet by mouth daily.    . celecoxib (CELEBREX) 200 MG capsule Take 200 mg by mouth as needed. Arthritis pain    . levothyroxine (SYNTHROID, LEVOTHROID) 50 MCG tablet Take 50 mcg by mouth daily.      . meclizine (ANTIVERT) 12.5 MG tablet Take 12.5 mg by mouth 3 (three) times daily.    . RESTASIS 0.05 % ophthalmic emulsion Place 1 drop into both eyes 2 (two) times daily.     . rosuvastatin (CRESTOR) 20 MG tablet Take 20 mg by mouth daily.      . traMADol (ULTRAM) 50 MG tablet Take 50 mg by mouth every 6 (six) hours as needed for moderate pain.      No current facility-administered medications for this visit.    Past Medical History  Diagnosis Date  . Hyperlipidemia   . HTN (hypertension)   . Hypothyroidism     . History of colon cancer 06/2009    found at time of TCS 06/29/09, 1.2cm sessile cecal polyp, no adjuvent therapy needed  . Colon cancer ([place])     colon ca dx 07/30/09  . Vertigo     Past Surgical History  Procedure Laterality Date  . Colon surgery  07/2009    right hemicolectomy, no residual colon cancer on path  . Partial thyroidectomy      benign tumors  . Partial hysterectomy    . Colonoscopy  07/16/2010    [id number] POLYP-TCS 3 YEARS  . Colonoscopy N/A 08/02/2013    Procedure: COLONOSCOPY;  Surgeon: Danie Binder, MD;  Location: [facility];  Service: Endoscopy;  Laterality: N/A;  9:30    Social History   Social History  . Marital Status: Widowed    Spouse Name: N/A  . Number of Children: 2  . Years of Education: N/A   Occupational History  . volunteer at [employer]   . retired from [employer]    Social History Main Topics  . Smoking status: Never Smoker   . Smokeless tobacco: Never Used  . Alcohol Use: No  . Drug Use: No  . Sexual Activity: No   Other Topics Concern  . Not on file   Social History Narrative       Prior to Admission medications  Medication Sig Start Date End Date Taking? Authorizing Provider  acetaminophen (TYLENOL) 500 MG tablet Take 1,000 mg by mouth every 8 (eight) hours as needed (cold).   Yes Historical Provider, MD  amLODipine (NORVASC) 5 MG tablet Take 1 tablet by mouth daily. 06/28/10  Yes Historical Provider, MD  celecoxib (CELEBREX) 200 MG capsule Take 200 mg by mouth as needed. Arthritis pain   Yes Historical Provider, MD  levothyroxine (SYNTHROID, LEVOTHROID) 50 MCG tablet Take 50 mcg by mouth daily.     Yes Historical Provider, MD  meclizine (ANTIVERT) 12.5 MG tablet Take 12.5 mg by mouth 3 (three) times daily.   Yes Historical Provider, MD  RESTASIS 0.05 % ophthalmic emulsion Place 1 drop into both eyes 2 (two) times daily.  05/17/10  Yes Historical Provider, MD  rosuvastatin (CRESTOR) 20 MG tablet Take 20 mg by mouth daily.      Yes Historical Provider, MD  traMADol (ULTRAM) 50 MG tablet Take 50 mg by mouth every 6 (six) hours as needed for moderate pain.  06/07/13  Yes Historical Provider, MD     Review of systems complete and found to be negative unless listed above in HPI     Physical exam Blood pressure 150/74, pulse 98, height 5\' 3"  (1.6 m), weight 167 lb 1.6 oz (75.796 kg), SpO2 92 %. General: NAD Neck: No JVD, no thyromegaly or thyroid nodule.  Lungs: Clear to auscultation bilaterally with normal respiratory effort. CV: Nondisplaced PMI. Regular rate and rhythm, normal S1/S2, no S3/S4, no murmur.  No peripheral edema.  No carotid bruit.   Abdomen: Soft, nontender, no hepatosplenomegaly, no distention.  Skin: Intact without lesions or rashes.  Neurologic: Alert and oriented x 3.  Psych: Normal affect. Extremities: No clubbing or cyanosis.  HEENT: Normal.   ECG: Most recent ECG reviewed.  Labs:   Lab Results  Component Value Date   WBC 5.4 04/15/2014   HGB 14.4 04/15/2014   HCT 43.9 04/15/2014   MCV 94.2 04/15/2014   PLT 352 04/15/2014   No results for input(s): NA, K, CL, CO2, BUN, CREATININE, CALCIUM, PROT, BILITOT, ALKPHOS, ALT, AST, GLUCOSE in the last 168 hours.  Invalid input(s): LABALBU No results found for: CKTOTAL, CKMB, CKMBINDEX, TROPONINI No results found for: CHOL No results found for: HDL No results found for: LDLCALC No results found for: TRIG No results found for: CHOLHDL No results found for: LDLDIRECT       Studies: No results found.  ASSESSMENT AND PLAN:  1. Vertigo: Does not appear to be cardiac in etiology. I spoke about potentially obtaining an echocardiogram given her abnormal ECG, but her daughter prefers to hold off which I think is reasonable.  2. Abnormal ECG: See #1.  3. Essential HTN: Mildly elevated today. Will monitor.  Dispo: f/u prn.   Signed: Kate Sable, M.D., F.A.C.C.  02/04/2015, 10:10 AM

## 2015-02-12 ENCOUNTER — Ambulatory Visit (INDEPENDENT_AMBULATORY_CARE_PROVIDER_SITE_OTHER): Payer: Medicare Other | Admitting: Otolaryngology

## 2015-02-12 DIAGNOSIS — H8113 Benign paroxysmal vertigo, bilateral: Secondary | ICD-10-CM

## 2015-02-21 ENCOUNTER — Ambulatory Visit (INDEPENDENT_AMBULATORY_CARE_PROVIDER_SITE_OTHER): Payer: Medicare Other | Admitting: Family Medicine

## 2015-02-21 VITALS — BP 130/82 | HR 89 | Temp 98.7°F | Resp 18 | Ht 63.75 in | Wt 170.6 lb

## 2015-02-21 DIAGNOSIS — H6121 Impacted cerumen, right ear: Secondary | ICD-10-CM

## 2015-02-21 NOTE — Progress Notes (Addendum)
Patient ID: [name], female   DOB: [birth year], 80 y.o.   MRN: [id number]   This chart was scribed for Robyn Haber, MD by [facility], medical scribe at [facility].The patient was seen in exam room 03 and the patient's care was started at 4:19 PM.  Patient ID: [name] MRN: [id number], DOB: March 30, 1934, 80 y.o. Date of Encounter: 02/21/2015  Primary Physician: Lanette Hampshire, MD  Chief Complaint:  Chief Complaint  Patient presents with   Ear Fullness    right ear feels full, unable to hear clearly. x1wk   HPI:  [name] is a 80 y.o. female who presents to [facility] complaining of right ear fullness with associated hearing loss for the past week. She denies fever and  Rash. Pain started as patient was leaving ENT office 5 days ago. She has chronic vertigo. She notes that the ear nose and throat specialist did not look at her years. She is complaining of hypersensitivity on the right side of her face adjacent to the right auricle.  Past Medical History  Diagnosis Date   Hyperlipidemia    HTN (hypertension)    Hypothyroidism    History of colon cancer 06/2009    found at time of TCS 06/29/09, 1.2cm sessile cecal polyp, no adjuvent therapy needed   Colon cancer ([place])     colon ca dx 07/30/09   Vertigo    Allergy      Home Meds: Prior to Admission medications   Medication Sig Start Date End Date Taking? Authorizing Provider  acetaminophen (TYLENOL) 500 MG tablet Take 1,000 mg by mouth every 8 (eight) hours as needed (cold).   Yes Historical Provider, MD  amLODipine (NORVASC) 5 MG tablet Take 1 tablet by mouth daily. 06/28/10  Yes Historical Provider, MD  celecoxib (CELEBREX) 200 MG capsule Take 200 mg by mouth as needed. Arthritis pain   Yes Historical Provider, MD  levothyroxine (SYNTHROID, LEVOTHROID) 50 MCG tablet Take 50 mcg by mouth daily.     Yes Historical Provider, MD  meclizine  (ANTIVERT) 12.5 MG tablet Take 12.5 mg by mouth 3 (three) times daily.   Yes Historical Provider, MD  RESTASIS 0.05 % ophthalmic emulsion Place 1 drop into both eyes 2 (two) times daily.  05/17/10  Yes Historical Provider, MD  rosuvastatin (CRESTOR) 20 MG tablet Take 20 mg by mouth daily.     Yes Historical Provider, MD  traMADol (ULTRAM) 50 MG tablet Take 50 mg by mouth every 6 (six) hours as needed for moderate pain.  06/07/13  Yes Historical Provider, MD   Allergies: No Known Allergies  Social History   Social History   Marital Status: Widowed    Spouse Name: N/A   Number of Children: 2   Years of Education: N/A   Occupational History   volunteer at [employer]    retired from [employer]    Social History Main Topics   Smoking status: Never Smoker    Smokeless tobacco: Never Used   Alcohol Use: No   Drug Use: No   Sexual Activity: No   Other Topics Concern   Not on file   Social History Narrative    Review of Systems: Constitutional: negative for chills, fever, night sweats, weight changes, or fatigue  HEENT: negative for vision changes, congestion, rhinorrhea, ST, epistaxis, or sinus pressure. Positive for hearing loss. Cardiovascular: negative for chest pain or palpitations Respiratory: negative for hemoptysis, wheezing, shortness of breath,  or cough Abdominal: negative for abdominal pain, nausea, vomiting, diarrhea, or constipation Dermatological: negative for rash Neurologic: negative for headache, dizziness, or syncope All other systems reviewed and are otherwise negative with the exception to those above and in the HPI.  Physical Exam: Blood pressure 130/82, pulse 89, temperature 98.7 F (37.1 C), temperature source Oral, resp. rate 18, height 5' 3.75" (1.619 m), weight 170 lb 9.6 oz (77.384 kg), SpO2 98 %., Body mass index is 29.52 kg/(m^2). General: Well developed, well nourished, in no acute distress. Head: Normocephalic, atraumatic, eyes without discharge,  sclera non-icteric, nares are without discharge. Bilateral auditory canals clear. Cerumen impaction right ear. Oral cavity moist, posterior pharynx without exudate, erythema, peritonsillar abscess, or post nasal drip.  Neck: Supple. No thyromegaly. Full ROM. No lymphadenopathy. Lungs: Clear bilaterally to auscultation without wheezes, rales, or rhonchi. Breathing is unlabored. Heart: RRR with S1 S2. No murmurs, rubs, or gallops appreciated. Abdomen: Soft, non-tender, non-distended with normoactive bowel sounds. No hepatomegaly. No rebound/guarding. No obvious abdominal masses. Msk:  Strength and tone normal for age. Extremities/Skin: Warm and dry. No clubbing or cyanosis. No edema. No rashes or suspicious lesions. Neuro: Alert and oriented X 3. Moves all extremities spontaneously. Gait is normal. CNII-XII grossly in tact. Psych:  Responds to questions appropriately with a normal affect.  Good relief of hearing loss and discomfort once the cerumen was removed ASSESSMENT AND PLAN:  80 y.o. year old female with cerumen impaction, chronic vertigo, and left facial hypersensitivity x  5 days. This chart was scribed in my presence and reviewed by me personally.    ICD-9-CM ICD-10-CM   1. Cerumen impaction, right 380.4 H61.21       By signing my name below, I, Nadim Abuhashem, attest that this documentation has been prepared under the direction and in the presence of Robyn Haber, MD.  Electronically Signed: Lora Havens, medical scribe. 02/21/2015 4:22 PM.

## 2015-03-05 ENCOUNTER — Ambulatory Visit (INDEPENDENT_AMBULATORY_CARE_PROVIDER_SITE_OTHER): Payer: Medicare Other | Admitting: Otolaryngology

## 2015-03-05 DIAGNOSIS — H903 Sensorineural hearing loss, bilateral: Secondary | ICD-10-CM | POA: Diagnosis not present

## 2015-03-05 DIAGNOSIS — H6122 Impacted cerumen, left ear: Secondary | ICD-10-CM | POA: Diagnosis not present

## 2015-04-16 ENCOUNTER — Ambulatory Visit (HOSPITAL_BASED_OUTPATIENT_CLINIC_OR_DEPARTMENT_OTHER): Payer: Medicare Other | Admitting: Oncology

## 2015-04-16 ENCOUNTER — Other Ambulatory Visit (HOSPITAL_BASED_OUTPATIENT_CLINIC_OR_DEPARTMENT_OTHER): Payer: Medicare Other

## 2015-04-16 ENCOUNTER — Telehealth: Payer: Self-pay | Admitting: Oncology

## 2015-04-16 ENCOUNTER — Other Ambulatory Visit: Payer: Self-pay | Admitting: *Deleted

## 2015-04-16 VITALS — BP 167/74 | HR 84 | Temp 98.2°F | Resp 17 | Ht 63.5 in | Wt 166.7 lb

## 2015-04-16 DIAGNOSIS — Z85038 Personal history of other malignant neoplasm of large intestine: Secondary | ICD-10-CM | POA: Diagnosis not present

## 2015-04-16 DIAGNOSIS — I10 Essential (primary) hypertension: Secondary | ICD-10-CM | POA: Diagnosis not present

## 2015-04-16 DIAGNOSIS — C189 Malignant neoplasm of colon, unspecified: Secondary | ICD-10-CM

## 2015-04-16 DIAGNOSIS — E039 Hypothyroidism, unspecified: Secondary | ICD-10-CM

## 2015-04-16 LAB — CBC WITH DIFFERENTIAL/PLATELET
BASO%: 1.1 % (ref 0.0–2.0)
Basophils Absolute: 0.1 10*3/uL (ref 0.0–0.1)
EOS%: 2.7 % (ref 0.0–7.0)
Eosinophils Absolute: 0.2 10*3/uL (ref 0.0–0.5)
HCT: 44.2 % (ref 34.8–46.6)
HGB: 14.7 g/dL (ref 11.6–15.9)
LYMPH#: 1.9 10*3/uL (ref 0.9–3.3)
LYMPH%: 32.4 % (ref 14.0–49.7)
MCH: 31.1 pg (ref 25.1–34.0)
MCHC: 33.2 g/dL (ref 31.5–36.0)
MCV: 93.8 fL (ref 79.5–101.0)
MONO#: 0.4 10*3/uL (ref 0.1–0.9)
MONO%: 6.3 % (ref 0.0–14.0)
NEUT#: 3.4 10*3/uL (ref 1.5–6.5)
NEUT%: 57.5 % (ref 38.4–76.8)
PLATELETS: 332 10*3/uL (ref 145–400)
RBC: 4.71 10*6/uL (ref 3.70–5.45)
RDW: 15 % — AB (ref 11.2–14.5)
WBC: 5.9 10*3/uL (ref 3.9–10.3)

## 2015-04-16 LAB — COMPREHENSIVE METABOLIC PANEL
ALT: 19 U/L (ref 0–55)
ANION GAP: 10 meq/L (ref 3–11)
AST: 18 U/L (ref 5–34)
Albumin: 4 g/dL (ref 3.5–5.0)
Alkaline Phosphatase: 103 U/L (ref 40–150)
BUN: 12.4 mg/dL (ref 7.0–26.0)
CO2: 29 meq/L (ref 22–29)
CREATININE: 1.2 mg/dL — AB (ref 0.6–1.1)
Calcium: 10 mg/dL (ref 8.4–10.4)
Chloride: 105 mEq/L (ref 98–109)
EGFR: 50 mL/min/{1.73_m2} — ABNORMAL LOW (ref >90)
Glucose: 102 mg/dl (ref 70–140)
Potassium: 3.9 mEq/L (ref 3.5–5.1)
Sodium: 144 mEq/L (ref 136–145)
TOTAL PROTEIN: 8.2 g/dL (ref 6.4–8.3)
Total Bilirubin: 0.78 mg/dL (ref 0.20–1.20)

## 2015-04-16 NOTE — Progress Notes (Signed)
Hematology and Oncology Follow Up Visit  [name] [id number] 01-01-1935 81 y.o. 04/16/2015 8:35 AM Lanette Hampshire, MDMcInnis, Angus, MD   Principle Diagnosis: 81 year old with Stage I colon cancer diagnosed in 2011. She continues to be in remission since that time.  Prior Therapy: She is status post laparoscopic right hemicolectomy with lysis of adhesions on 07/31/2009 for an adenocarcinoma within a polyp with 0 of 25 lymph nodes sampled having evidence of malignancy.   Current therapy: Watchful observation.  Interim History:  [name] returns for routine followup visit. Since her last visit, she reports no major changes in her health. She continues to live independently for the most part and ambulating without any major falls or syncope. She is using a cane and does report some occasional vertigo.  Her appetite is good and her weight is stable. She denies any rectal bleeding. No change in her bowel function. No bowel pain, nausea, vomiting. Her quality of life has not changed at this time. She is up-to-date on her mammography as well as colonoscopy.   She does not report any headaches or blurry vision. She does not report any fevers, chills or sweats. She does not report any chest pain palpitation or orthopnea. Does not report any cough or hemoptysis. Does not report any frequency urgency or hesitancy. Rest of her review of systems unremarkable.   Medications: I have reviewed the patient's current medications.   Current Outpatient Prescriptions  Medication Sig Dispense Refill  . acetaminophen (TYLENOL) 500 MG tablet Take 1,000 mg by mouth every 8 (eight) hours as needed (cold).    Marland Kitchen amLODipine (NORVASC) 5 MG tablet Take 1 tablet by mouth daily.    . celecoxib (CELEBREX) 200 MG capsule Take 200 mg by mouth as needed. Arthritis pain    . levothyroxine (SYNTHROID, LEVOTHROID) 50 MCG tablet Take 50 mcg by mouth daily.      . meclizine (ANTIVERT) 12.5 MG tablet Take 12.5 mg  by mouth 3 (three) times daily.    . RESTASIS 0.05 % ophthalmic emulsion Place 1 drop into both eyes 2 (two) times daily.     . rosuvastatin (CRESTOR) 20 MG tablet Take 20 mg by mouth daily.      . traMADol (ULTRAM) 50 MG tablet Take 50 mg by mouth every 6 (six) hours as needed for moderate pain.      No current facility-administered medications for this visit.    Allergies: No Known Allergies  Past Medical History, Surgical history, Social history, and Family History were reviewed and updated.    Physical Exam: Blood pressure 167/74, pulse 84, temperature 98.2 F (36.8 C), temperature source Oral, resp. rate 17, height 5' 3.5" (1.613 m), weight 166 lb 11.2 oz (75.615 kg), SpO2 98 %. ECOG: 1 General appearance: alert and cooperative appeared that without distress. Head: Normocephalic, without obvious abnormality Neck: no adenopathy Lymph nodes: Cervical, supraclavicular, and axillary nodes normal. Heart:regular rate and rhythm, S1, S2 normal, no murmur, click, rub or gallop Lung:chest clear, no wheezing, rales, normal symmetric air entry,  Abdomin: soft, non-tender, without masses or organomegaly no shifting dullness or ascites. EXT:no erythema, induration, or nodules   Lab Results: Lab Results  Component Value Date   WBC 5.9 04/16/2015   HGB 14.7 04/16/2015   HCT 44.2 04/16/2015   MCV 93.8 04/16/2015   PLT 332 04/16/2015     Chemistry      Component Value Date/Time   NA 143 04/15/2014 0814   NA 140 02/21/2013 0743  NA 139 08/27/2010 0909   K 4.0 04/15/2014 0814   K 4.1 02/21/2013 0743   K 3.9 08/27/2010 0909   CL 101 02/21/2013 0743   CL 105 04/02/2012 0914   CL 99 08/27/2010 0909   CO2 28 04/15/2014 0814   CO2 28 02/21/2013 0743   CO2 31 08/27/2010 0909   BUN 13.4 04/15/2014 0814   BUN 11 02/21/2013 0743   BUN 13 08/27/2010 0909   CREATININE 1.3* 04/15/2014 0814   CREATININE 0.96 02/21/2013 0743   CREATININE 1.2 08/27/2010 0909      Component Value  Date/Time   CALCIUM 9.4 04/15/2014 0814   CALCIUM 9.9 02/21/2013 0743   CALCIUM 9.5 08/27/2010 0909   ALKPHOS 98 04/15/2014 0814   ALKPHOS 63 03/14/2012 0502   ALKPHOS 101* 08/27/2010 0909   AST 14 04/15/2014 0814   AST 22 03/14/2012 0502   AST 25 08/27/2010 0909   ALT 14 04/15/2014 0814   ALT 14 03/14/2012 0502   ALT 24 08/27/2010 0909   BILITOT 0.69 04/15/2014 0814   BILITOT 0.3 03/14/2012 0502   BILITOT 0.80 08/27/2010 0909     Impression and Plan:  81 year old female with the following issues:  1. Colon cancer. The patient is status post laparoscopic right hemicolectomy in May 2011. Her laboratory data CT scan results from 04/03/2013  showed no evidence to suggest relapse or recurrent disease.   She is approaching 6 years out from her cancer surgery and it is extremely unlikely that she will develop a relapse from this cancer. I have offered her continued annual surveillance versus follow-up as needed and for the time being she prefers to follow-up at least one more time next year. She is reestablishing care with another primary care provider and would like to defer been released from oncology for one more year. I certainly will be more than happy to see her next year and repeat laboratory testing and CEA. She is more than welcome to cancel that appointment next year if she is establishing primary care providers.  2. Hypertension. Her blood pressure is slightly elevated today but usually within normal range outside the clinic.  3. Hypothyroidism. The patient is on Synthroid per PCP.  4. Colonoscopy screening: She is up-to-date and completed a colonoscopy 2015. She will likely have that repeated in 3-5 years.     [name] 2/2/20178:35 AM

## 2015-04-16 NOTE — Telephone Encounter (Signed)
Gave patient avs report and appointments for February 2018.  °

## 2015-04-17 LAB — CEA (PARALLEL TESTING): CEA: 0.7 ng/mL (ref 0.0–5.0)

## 2015-04-17 LAB — CEA: CEA1: 2.3 ng/mL (ref 0.0–4.7)

## 2015-11-02 ENCOUNTER — Ambulatory Visit (INDEPENDENT_AMBULATORY_CARE_PROVIDER_SITE_OTHER): Payer: Medicare Other | Admitting: Family Medicine

## 2015-11-02 ENCOUNTER — Encounter: Payer: Self-pay | Admitting: Family Medicine

## 2015-11-02 VITALS — BP 136/72 | HR 76 | Temp 98.6°F | Resp 16 | Ht 63.0 in | Wt 150.0 lb

## 2015-11-02 DIAGNOSIS — M1710 Unilateral primary osteoarthritis, unspecified knee: Secondary | ICD-10-CM | POA: Insufficient documentation

## 2015-11-02 DIAGNOSIS — I10 Essential (primary) hypertension: Secondary | ICD-10-CM | POA: Diagnosis not present

## 2015-11-02 DIAGNOSIS — M170 Bilateral primary osteoarthritis of knee: Secondary | ICD-10-CM

## 2015-11-02 DIAGNOSIS — R634 Abnormal weight loss: Secondary | ICD-10-CM

## 2015-11-02 DIAGNOSIS — M179 Osteoarthritis of knee, unspecified: Secondary | ICD-10-CM | POA: Insufficient documentation

## 2015-11-02 DIAGNOSIS — N183 Chronic kidney disease, stage 3 unspecified: Secondary | ICD-10-CM

## 2015-11-02 DIAGNOSIS — Z85038 Personal history of other malignant neoplasm of large intestine: Secondary | ICD-10-CM | POA: Diagnosis not present

## 2015-11-02 DIAGNOSIS — N184 Chronic kidney disease, stage 4 (severe): Secondary | ICD-10-CM | POA: Insufficient documentation

## 2015-11-02 DIAGNOSIS — R42 Dizziness and giddiness: Secondary | ICD-10-CM | POA: Insufficient documentation

## 2015-11-02 DIAGNOSIS — E039 Hypothyroidism, unspecified: Secondary | ICD-10-CM | POA: Diagnosis not present

## 2015-11-02 DIAGNOSIS — N1832 Chronic kidney disease, stage 3b: Secondary | ICD-10-CM | POA: Insufficient documentation

## 2015-11-02 MED ORDER — LOSARTAN POTASSIUM 50 MG PO TABS: 50.0000 mg | ORAL_TABLET | Freq: Every day | ORAL | 3 refills | Status: DC

## 2015-11-02 NOTE — Patient Instructions (Addendum)
No Celebrex  Release of records- Dr. Everette Rank  Release of records- Dr. Benjamine Mola  F/U 4 months

## 2015-11-02 NOTE — Assessment & Plan Note (Signed)
It seems her weight loss is multifactorial from not being able to see with her dental procedures to then significant changes in her diet

## 2015-11-02 NOTE — Assessment & Plan Note (Signed)
Followed by ENT obtain records Gets Epley manuevers as needed for treatment

## 2015-11-02 NOTE — Assessment & Plan Note (Signed)
Blood pressure is well controlled medication medication. She does have chronic kidney disease which she is seeing a nephrologist for Monday I will follow-up with those records and evaluations. I'm not obtain any labs today she just had them a month ago we'll get these from her previous primary care provider With her renal disease advised her not to take any anti-inflammatories by mouth

## 2015-11-02 NOTE — Progress Notes (Signed)
Subjective:    Patient ID: [name], female    DOB: Jan 04, 1935, 81 y.o.   MRN: [id number]  Patient presents for [facility] (is fasting)  Patient here to establish care. Previous primary care provider Dr. Emilee Hero who recently retired  Medications and history reviewed She is followed by oncology Dr. Alen Blew secondary to history of colon cancer diagnosed in 2011 she's been in remission since that time, she did have right hemicolectomy  GI- Dr. Oneida Alar  Hypertension history of hypertension greater than 10 years she's been on medication without any difficulties no history of any heart disease.   Hypothyroidism currently on Synthroid no history of thyroid cancer but had benign nodules, had thyroidectomy . Has had multiple changes in meds, had labs done 1 month ago.    Weight loss- weight down 17 Pounds since February of this year. She states that she cannot eat as she had teeth removed and dentures placed then she found out about chronic kidney disease which she is seen a specialist for on this Monday and she began cutting back her sodium and some of her typical foods also causing weight loss.  History of chronic vertigo she is followed by ear nose and throat- Dr. Benjamine Mola  She has osteoarthritis of her knees she uses a topical compounded cream very sparingly. She has Celebrex listed on the chart but is not taking this.  Review Of Systems:  GEN- denies fatigue, fever, weight loss,weakness, recent illness HEENT- denies eye drainage, change in vision, nasal discharge, CVS- denies chest pain, palpitations RESP- denies SOB, cough, wheeze ABD- denies N/V, change in stools, abd pain GU- denies dysuria, hematuria, dribbling, incontinence MSK- denies joint pain, muscle aches, injury Neuro- denies headache, dizziness, syncope, seizure activity       Objective:    BP 136/72 (BP Location: Left Arm, Patient Position: Sitting, Cuff Size: Normal)   Pulse 76   Temp  98.6 F (37 C) (Oral)   Resp 16   Ht 5\' 3"  (1.6 m)   Wt 150 lb (68 kg)   BMI 26.57 kg/m  GEN- NAD, alert and oriented x3 HEENT- PERRL, EOMI, non injected sclera, pink conjunctiva, MMM, oropharynx clear Neck- Supple, no thyromegaly CVS- RRR, no murmur RESP-CTAB ABD-NABS,soft,NT,ND EXT- No edema Pulses- Radial, DP- 2+        Assessment & Plan:      Problem List Items Addressed This Visit    Vertigo    Followed by ENT obtain records Gets Epley manuevers as needed for treatment      OA (osteoarthritis) of knee   Loss of weight    It seems her weight loss is multifactorial from not being able to see with her dental procedures to then significant changes in her diet      Hypothyroidism   History of colon cancer - Primary   Essential hypertension    Blood pressure is well controlled medication medication. She does have chronic kidney disease which she is seeing a nephrologist for Monday I will follow-up with those records and evaluations. I'm not obtain any labs today she just had them a month ago we'll get these from her previous primary care provider With her renal disease advised her not to take any anti-inflammatories by mouth      Relevant Medications   atorvastatin (LIPITOR) 80 MG tablet   losartan (COZAAR) 50 MG tablet   CKD (chronic kidney disease), stage III    Other Visit Diagnoses   None.  Note: This dictation was prepared with Dragon dictation along with smaller phrase technology. Any transcriptional errors that result from this process are unintentional.

## 2015-11-21 ENCOUNTER — Observation Stay (HOSPITAL_COMMUNITY)
Admission: EM | Admit: 2015-11-21 | Discharge: 2015-11-22 | Disposition: A | Discharge status: Home / Self Care | Payer: Medicare Other | Attending: Family Medicine | Admitting: Family Medicine

## 2015-11-21 ENCOUNTER — Emergency Department (HOSPITAL_COMMUNITY): Payer: Medicare Other

## 2015-11-21 ENCOUNTER — Encounter (HOSPITAL_COMMUNITY): Payer: Self-pay | Admitting: Emergency Medicine

## 2015-11-21 DIAGNOSIS — N1832 Chronic kidney disease, stage 3b: Secondary | ICD-10-CM | POA: Diagnosis present

## 2015-11-21 DIAGNOSIS — R200 Anesthesia of skin: Secondary | ICD-10-CM | POA: Diagnosis present

## 2015-11-21 DIAGNOSIS — N183 Chronic kidney disease, stage 3 unspecified: Secondary | ICD-10-CM | POA: Diagnosis present

## 2015-11-21 DIAGNOSIS — I10 Essential (primary) hypertension: Secondary | ICD-10-CM | POA: Diagnosis present

## 2015-11-21 DIAGNOSIS — I129 Hypertensive chronic kidney disease with stage 1 through stage 4 chronic kidney disease, or unspecified chronic kidney disease: Secondary | ICD-10-CM | POA: Diagnosis not present

## 2015-11-21 DIAGNOSIS — I639 Cerebral infarction, unspecified: Secondary | ICD-10-CM | POA: Diagnosis not present

## 2015-11-21 DIAGNOSIS — E039 Hypothyroidism, unspecified: Secondary | ICD-10-CM | POA: Diagnosis not present

## 2015-11-21 DIAGNOSIS — R29898 Other symptoms and signs involving the musculoskeletal system: Secondary | ICD-10-CM | POA: Diagnosis present

## 2015-11-21 DIAGNOSIS — N184 Chronic kidney disease, stage 4 (severe): Secondary | ICD-10-CM | POA: Diagnosis present

## 2015-11-21 DIAGNOSIS — Z79899 Other long term (current) drug therapy: Secondary | ICD-10-CM | POA: Diagnosis not present

## 2015-11-21 DIAGNOSIS — Z85038 Personal history of other malignant neoplasm of large intestine: Secondary | ICD-10-CM | POA: Insufficient documentation

## 2015-11-21 DIAGNOSIS — R202 Paresthesia of skin: Secondary | ICD-10-CM | POA: Diagnosis present

## 2015-11-21 LAB — COMPREHENSIVE METABOLIC PANEL WITH GFR
ALT: 16 U/L (ref 14–54)
AST: 18 U/L (ref 15–41)
Albumin: 4.1 g/dL (ref 3.5–5.0)
Alkaline Phosphatase: 107 U/L (ref 38–126)
Anion gap: 5 (ref 5–15)
BUN: 17 mg/dL (ref 6–20)
CO2: 29 mmol/L (ref 22–32)
Calcium: 9.5 mg/dL (ref 8.9–10.3)
Chloride: 106 mmol/L (ref 101–111)
Creatinine, Ser: 1.26 mg/dL — ABNORMAL HIGH (ref 0.44–1.00)
GFR calc Af Amer: 46 mL/min — ABNORMAL LOW
GFR calc non Af Amer: 40 mL/min — ABNORMAL LOW
Glucose, Bld: 92 mg/dL (ref 65–99)
Potassium: 3.9 mmol/L (ref 3.5–5.1)
Sodium: 140 mmol/L (ref 135–145)
Total Bilirubin: 0.8 mg/dL (ref 0.3–1.2)
Total Protein: 8 g/dL (ref 6.5–8.1)

## 2015-11-21 LAB — URINALYSIS, ROUTINE W REFLEX MICROSCOPIC
Bilirubin Urine: NEGATIVE
Glucose, UA: NEGATIVE mg/dL
Hgb urine dipstick: NEGATIVE
Ketones, ur: NEGATIVE mg/dL
Nitrite: NEGATIVE
Protein, ur: NEGATIVE mg/dL
Specific Gravity, Urine: <1.005 — ABNORMAL LOW (ref 1.005–1.030)
pH: 6 (ref 5.0–8.0)

## 2015-11-21 LAB — CBC WITH DIFFERENTIAL/PLATELET
Basophils Absolute: 0 K/uL (ref 0.0–0.1)
Basophils Relative: 1 %
Eosinophils Absolute: 0.1 K/uL (ref 0.0–0.7)
Eosinophils Relative: 1 %
HCT: 41.7 % (ref 36.0–46.0)
Hemoglobin: 13.6 g/dL (ref 12.0–15.0)
Lymphocytes Relative: 31 %
Lymphs Abs: 1.8 K/uL (ref 0.7–4.0)
MCH: 31.3 pg (ref 26.0–34.0)
MCHC: 32.6 g/dL (ref 30.0–36.0)
MCV: 96.1 fL (ref 78.0–100.0)
Monocytes Absolute: 0.3 K/uL (ref 0.1–1.0)
Monocytes Relative: 5 %
Neutro Abs: 3.6 K/uL (ref 1.7–7.7)
Neutrophils Relative %: 62 %
Platelets: 358 K/uL (ref 150–400)
RBC: 4.34 MIL/uL (ref 3.87–5.11)
RDW: 14.6 % (ref 11.5–15.5)
WBC: 5.7 K/uL (ref 4.0–10.5)

## 2015-11-21 LAB — TROPONIN I: Troponin I: <0.03 ng/mL (ref <0.03)

## 2015-11-21 LAB — URINE MICROSCOPIC-ADD ON

## 2015-11-21 MED ORDER — ATORVASTATIN CALCIUM 40 MG PO TABS
80.0000 mg | ORAL_TABLET | Freq: Every day | ORAL | Status: DC
  Administered 2015-11-21 – 2015-11-22 (×2): 80 mg via ORAL
  Filled 2015-11-21: qty 1
  Filled 2015-11-21: qty 2
  Filled 2015-11-21: qty 1
  Filled 2015-11-21: qty 2
  Filled 2015-11-21: qty 1

## 2015-11-21 MED ORDER — STROKE: EARLY STAGES OF RECOVERY BOOK
Freq: Once | Status: DC
  Filled 2015-11-21: qty 1

## 2015-11-21 MED ORDER — SENNOSIDES-DOCUSATE SODIUM 8.6-50 MG PO TABS: 1.0000 | ORAL_TABLET | Freq: Every evening | ORAL | Status: DC | PRN

## 2015-11-21 MED ORDER — CYCLOSPORINE 0.05 % OP EMUL
OPHTHALMIC | Status: AC
  Filled 2015-11-21: qty 1

## 2015-11-21 MED ORDER — SODIUM CHLORIDE 0.9 % IV SOLN
INTRAVENOUS | Status: AC
  Administered 2015-11-21: 18:00:00 via INTRAVENOUS

## 2015-11-21 MED ORDER — HEPARIN SODIUM (PORCINE) 5000 UNIT/ML IJ SOLN
5000.0000 [IU] | Freq: Three times a day (TID) | INTRAMUSCULAR | Status: DC
  Administered 2015-11-21 – 2015-11-22 (×3): 5000 [IU] via SUBCUTANEOUS
  Filled 2015-11-21 (×4): qty 1

## 2015-11-21 MED ORDER — ASPIRIN 325 MG PO TABS
325.0000 mg | ORAL_TABLET | Freq: Every day | ORAL | Status: DC
  Administered 2015-11-21 – 2015-11-22 (×2): 325 mg via ORAL
  Filled 2015-11-21 (×2): qty 1

## 2015-11-21 MED ORDER — LEVOTHYROXINE SODIUM 50 MCG PO TABS
50.0000 ug | ORAL_TABLET | Freq: Every day | ORAL | Status: DC
  Administered 2015-11-22: 50 ug via ORAL
  Filled 2015-11-21 (×2): qty 1

## 2015-11-21 MED ORDER — CYCLOSPORINE 0.05 % OP EMUL
1.0000 [drp] | Freq: Two times a day (BID) | OPHTHALMIC | Status: DC
  Administered 2015-11-21 – 2015-11-22 (×2): 1 [drp] via OPHTHALMIC
  Filled 2015-11-21 (×4): qty 1

## 2015-11-21 MED ORDER — ALUM & MAG HYDROXIDE-SIMETH 200-200-20 MG/5ML PO SUSP: 15.0000 mL | Freq: Four times a day (QID) | ORAL | Status: DC | PRN

## 2015-11-21 NOTE — ED Provider Notes (Signed)
[facility] DEPT Provider Note   CSN: [id number] Arrival date & time: 11/21/15  1246     History   Chief Complaint Chief Complaint  Patient presents with  . Weakness    HPI [name] is a 81 y.o. female.  HPI  Pt was seen at 1320. Per pt, c/o gradual onset and persistence of constant left UE and LE "tingling/numbess" for the past 2 days. Pt states her LLE is also "weak," unknown onset. Denies CP/SOB, no abd pain, no N/V/D, no fevers, no rash, no injury, no visual changes, no ataxia, no slurred speech, no facial droop.    Past Medical History:  Diagnosis Date  . Allergy   . Arthritis   . Colon cancer ([place])    colon ca dx 07/30/09  . History of colon cancer 06/2009   found at time of TCS 06/29/09, 1.2cm sessile cecal polyp, no adjuvent therapy needed  . HTN (hypertension)   . Hyperlipidemia   . Hypothyroidism   . Vertigo     Patient Active Problem List   Diagnosis Date Noted  . Essential hypertension 11/02/2015  . Hypothyroidism 11/02/2015  . Vertigo 11/02/2015  . CKD (chronic kidney disease), stage III 11/02/2015  . Loss of weight 11/02/2015  . OA (osteoarthritis) of knee 11/02/2015  . Colon cancer ([place]) 02/28/2011  . History of colon cancer 06/29/2010  . CONSTIPATION, INTERMITTENT 06/10/2009    Past Surgical History:  Procedure Laterality Date  . ABDOMINAL HYSTERECTOMY    . COLON SURGERY  07/2009   right hemicolectomy, no residual colon cancer on path  . COLONOSCOPY  07/16/2010   [id number] POLYP-TCS 3 YEARS  . COLONOSCOPY N/A 08/02/2013   Procedure: COLONOSCOPY;  Surgeon: Danie Binder, MD;  Location: [facility];  Service: Endoscopy;  Laterality: N/A;  9:30  . PARTIAL HYSTERECTOMY    . partial thyroidectomy     benign tumors       Home Medications    Prior to Admission medications   Medication Sig Start Date End Date Taking? Authorizing Provider  acetaminophen (TYLENOL) 500 MG tablet Take 1,000 mg by mouth every 8 (eight)  hours as needed (cold).    Historical Provider, MD  amLODipine (NORVASC) 5 MG tablet Take 1 tablet by mouth daily. 06/28/10   Historical Provider, MD  atorvastatin (LIPITOR) 80 MG tablet Take 80 mg by mouth daily.    Historical Provider, MD  levothyroxine (SYNTHROID, LEVOTHROID) 50 MCG tablet Take 50 mcg by mouth daily.      Historical Provider, MD  losartan (COZAAR) 50 MG tablet Take 1 tablet (50 mg total) by mouth daily. 11/02/15   Alycia Rossetti, MD  RESTASIS 0.05 % ophthalmic emulsion Place 1 drop into both eyes 2 (two) times daily.  05/17/10   Historical Provider, MD    Family History Family History  Problem Relation Age of Onset  . Colon cancer Mother     >age60  . Arthritis Mother   . Cancer Mother   . Heart disease Mother   . Hyperlipidemia Mother   . Hypertension Mother   . Heart attack Father   . Heart disease Father   . Diabetes Maternal Aunt   . Hyperlipidemia Daughter   . Hypertension Daughter   . Liver disease Neg Hx     Social History Social History  Substance Use Topics  . Smoking status: Never Smoker  . Smokeless tobacco: Never Used  . Alcohol use No     Allergies   Review of  patient's allergies indicates no known allergies.   Review of Systems Review of Systems ROS: Statement: All systems negative except as marked or noted in the HPI; Constitutional: Negative for fever and chills. ; ; Eyes: Negative for eye pain, redness and discharge. ; ; ENMT: Negative for ear pain, hoarseness, nasal congestion, sinus pressure and sore throat. ; ; Cardiovascular: Negative for chest pain, palpitations, diaphoresis, dyspnea and peripheral edema. ; ; Respiratory: Negative for cough, wheezing and stridor. ; ; Gastrointestinal: Negative for nausea, vomiting, diarrhea, abdominal pain, blood in stool, hematemesis, jaundice and rectal bleeding. . ; ; Genitourinary: Negative for dysuria, flank pain and hematuria. ; ; Musculoskeletal: Negative for back pain and neck pain. Negative for  swelling and trauma.; ; Skin: Negative for pruritus, rash, abrasions, blisters, bruising and skin lesion.; ; Neuro: +left sided numbness, weakness. Negative for headache, lightheadedness and neck stiffness. Negative for altered level of consciousness, altered mental status, involuntary movement, seizure and syncope.       Physical Exam Updated Vital Signs BP 144/77 (BP Location: Left Arm)   Pulse 79   Temp 98.7 F (37.1 C) (Oral)   Resp 16   Ht 5\' 3"  (1.6 m)   Wt 150 lb (68 kg)   SpO2 100%   BMI 26.57 kg/m   Physical Exam 1325: Physical examination:  Nursing notes reviewed; Vital signs and O2 SAT reviewed;  Constitutional: Well developed, Well nourished, Well hydrated, In no acute distress; Head:  Normocephalic, atraumatic; Eyes: EOMI, PERRL, No scleral icterus; ENMT: Mouth and pharynx normal, Mucous membranes moist; Neck: Supple, Full range of motion, No lymphadenopathy; Cardiovascular: Regular rate and rhythm, No gallop; Respiratory: Breath sounds clear & equal bilaterally, No wheezes.  Speaking full sentences with ease, Normal respiratory effort/excursion; Chest: Nontender, Movement normal; Abdomen: Soft, Nontender, Nondistended, Normal bowel sounds; Genitourinary: No CVA tenderness; Spine:  No midline CS, TS, LS tenderness. +TTP left hypertonic trapezius muscle. No rash.;; Extremities: Pulses normal, No tenderness, No edema, No calf edema or asymmetry.; Neuro: AA&Ox3, vague historian. Major CN grossly intact. Speech clear.  No facial droop.  No nystagmus. Grips equal. Strength 5/5 bilat UE's and RLE, 4/5 strength LLE.  DTR 2/4 equal bilat UE's and LE's.  +subjective decreased sensation LUE, otherwise no gross sensory deficits.  Normal cerebellar testing bilat UE's (finger-nose) and RLE (heel-shin), LLE ataxia..; Skin: Color normal, Warm, Dry.   ED Treatments / Results  Labs (all labs ordered are listed, but only abnormal results are displayed)   EKG  EKG  Interpretation  Date/Time:  Saturday November 21 2015 12:58:25 EDT Ventricular Rate:  76 PR Interval:    QRS Duration: 81 QT Interval:  381 QTC Calculation: 429 R Axis:   -26 Text Interpretation:  Sinus rhythm Borderline left axis deviation Borderline T wave abnormalities Baseline wander When compared with ECG of 02/21/2013 No significant change was found Confirmed by [facility]  MD, Nunzio Cory [phone]) on 11/21/2015 1:35:50 PM       Radiology   Procedures Procedures (including critical care time)  Medications Ordered in ED Medications - No data to display   Initial Impression / Assessment and Plan / ED Course  I have reviewed the triage vital signs and the nursing notes.  Pertinent labs & imaging results that were available during my care of the patient were reviewed by me and considered in my medical decision making (see chart for details).  MDM Reviewed: previous chart, nursing note and vitals Reviewed previous: labs and ECG Interpretation: labs, x-ray, CT scan and  ECG   Results for orders placed or performed during the hospital encounter of 11/21/15  Comprehensive metabolic panel  Result Value Ref Range   Sodium 140 135 - 145 mmol/L   Potassium 3.9 3.5 - 5.1 mmol/L   Chloride 106 101 - 111 mmol/L   CO2 29 22 - 32 mmol/L   Glucose, Bld 92 65 - 99 mg/dL   BUN 17 6 - 20 mg/dL   Creatinine, Ser 1.26 (H) 0.44 - 1.00 mg/dL   Calcium 9.5 8.9 - 10.3 mg/dL   Total Protein 8.0 6.5 - 8.1 g/dL   Albumin 4.1 3.5 - 5.0 g/dL   AST 18 15 - 41 U/L   ALT 16 14 - 54 U/L   Alkaline Phosphatase 107 38 - 126 U/L   Total Bilirubin 0.8 0.3 - 1.2 mg/dL   GFR calc non Af Amer 40 (L) >60 mL/min   GFR calc Af Amer 46 (L) >60 mL/min   Anion gap 5 5 - 15  Troponin I  Result Value Ref Range   Troponin I <0.03 <0.03 ng/mL  CBC with Differential  Result Value Ref Range   WBC 5.7 4.0 - 10.5 K/uL   RBC 4.34 3.87 - 5.11 MIL/uL   Hemoglobin 13.6 12.0 - 15.0 g/dL   HCT 41.7 36.0 - 46.0 %   MCV  96.1 78.0 - 100.0 fL   MCH 31.3 26.0 - 34.0 pg   MCHC 32.6 30.0 - 36.0 g/dL   RDW 14.6 11.5 - 15.5 %   Platelets 358 150 - 400 K/uL   Neutrophils Relative % 62 %   Neutro Abs 3.6 1.7 - 7.7 K/uL   Lymphocytes Relative 31 %   Lymphs Abs 1.8 0.7 - 4.0 K/uL   Monocytes Relative 5 %   Monocytes Absolute 0.3 0.1 - 1.0 K/uL   Eosinophils Relative 1 %   Eosinophils Absolute 0.1 0.0 - 0.7 K/uL   Basophils Relative 1 %   Basophils Absolute 0.0 0.0 - 0.1 K/uL   Dg Chest 2 View Result Date: 11/21/2015 CLINICAL DATA:  Left-sided tingling and weakness. EXAM: CHEST  2 VIEW COMPARISON:  09/16/2013 FINDINGS: The heart size and mediastinal contours are within normal limits. Both lungs are clear. The visualized skeletal structures are unremarkable. IMPRESSION: No active cardiopulmonary disease. Electronically Signed   By: Kerby Moors M.D.   On: 11/21/2015 14:15   Ct Head Wo Contrast Result Date: 11/21/2015 CLINICAL DATA:  Left-sided weakness and tingling EXAM: CT HEAD WITHOUT CONTRAST CT CERVICAL SPINE WITHOUT CONTRAST TECHNIQUE: Multidetector CT imaging of the head and cervical spine was performed following the standard protocol without intravenous contrast. Multiplanar CT image reconstructions of the cervical spine were also generated. COMPARISON:  02/21/2013 FINDINGS: CT HEAD FINDINGS Brain: No intracranial hemorrhage, mass effect or midline shift. Stable cerebral atrophy. Stable chronic white matter disease. No definite acute cortical infarction. No mass lesion is noted on this unenhanced scan. Vascular: Mild atherosclerotic calcifications of carotid siphon. Skull: No skull fracture is noted. Sinuses/Orbits: No acute finding. Other: None CT CERVICAL SPINE FINDINGS Alignment: There is normal alignment. Skull base and vertebrae: No acute fracture is noted. Sagittal images of the spine shows degenerative changes C1-C2 articulation. Soft tissues and spinal canal: No prevertebral soft tissue swelling. Spinal canal  is patent. Disc levels: Mild disc space flattening with anterior spurring at C3-C4 level. There is about 2 mm anterolisthesis C4 on C5 vertebral body. Mild anterior spurring at C4-C5 level. Minimal anterior spurring lower endplate C5 and  C6 vertebral body. Mild disc space flattening with anterior spurring at C6-C7 level. Upper chest: There is no pneumothorax in visualized lung apices. Other: There is asymmetric low-density nodular enlargement of the right lobe of thyroid gland measures at least 2.7 cm. Further evaluation with thyroid gland ultrasound is recommended to exclude a mass. IMPRESSION: 1. No acute intracranial abnormality. Stable atrophy and chronic white matter disease. 2. No definite acute cortical infarction. 3. No cervical spine acute fracture or subluxation. Degenerative changes as described above. 4. There is nodular enlargement of left lobe of thyroid gland measures at least 2.7 cm. Further evaluation with thyroid gland ultrasound is recommended. Electronically Signed   By: Lahoma Crocker M.D.   On: 11/21/2015 14:37   Ct Cervical Spine Wo Contrast Result Date: 11/21/2015 CLINICAL DATA:  Left-sided weakness and tingling EXAM: CT HEAD WITHOUT CONTRAST CT CERVICAL SPINE WITHOUT CONTRAST TECHNIQUE: Multidetector CT imaging of the head and cervical spine was performed following the standard protocol without intravenous contrast. Multiplanar CT image reconstructions of the cervical spine were also generated. COMPARISON:  02/21/2013 FINDINGS: CT HEAD FINDINGS Brain: No intracranial hemorrhage, mass effect or midline shift. Stable cerebral atrophy. Stable chronic white matter disease. No definite acute cortical infarction. No mass lesion is noted on this unenhanced scan. Vascular: Mild atherosclerotic calcifications of carotid siphon. Skull: No skull fracture is noted. Sinuses/Orbits: No acute finding. Other: None CT CERVICAL SPINE FINDINGS Alignment: There is normal alignment. Skull base and vertebrae: No  acute fracture is noted. Sagittal images of the spine shows degenerative changes C1-C2 articulation. Soft tissues and spinal canal: No prevertebral soft tissue swelling. Spinal canal is patent. Disc levels: Mild disc space flattening with anterior spurring at C3-C4 level. There is about 2 mm anterolisthesis C4 on C5 vertebral body. Mild anterior spurring at C4-C5 level. Minimal anterior spurring lower endplate C5 and C6 vertebral body. Mild disc space flattening with anterior spurring at C6-C7 level. Upper chest: There is no pneumothorax in visualized lung apices. Other: There is asymmetric low-density nodular enlargement of the right lobe of thyroid gland measures at least 2.7 cm. Further evaluation with thyroid gland ultrasound is recommended to exclude a mass. IMPRESSION: 1. No acute intracranial abnormality. Stable atrophy and chronic white matter disease. 2. No definite acute cortical infarction. 3. No cervical spine acute fracture or subluxation. Degenerative changes as described above. 4. There is nodular enlargement of left lobe of thyroid gland measures at least 2.7 cm. Further evaluation with thyroid gland ultrasound is recommended. Electronically Signed   By: Lahoma Crocker M.D.   On: 11/21/2015 14:37    1530:  CT reassuring; still concern for small CVA given pt's left sided symptoms. Dx and testing d/w pt and family.  Questions answered.  Verb understanding, agreeable to observation admit. T/C to [facility] Dr. Myna Hidalgo, case discussed, including:  HPI, pertinent PM/SHx, VS/PE, dx testing, ED course and treatment:  Agreeable to admit, requests to write temporary orders, obtain tele bed to team [facility].  Final Clinical Impressions(s) / ED Diagnoses   Final diagnoses:  None    New Prescriptions New Prescriptions   No medications on file     Francine Graven, DO 11/24/15 1707

## 2015-11-21 NOTE — H&P (Signed)
History and Physical    [name] [id number] DOB: February 22, 1935 DOA: 11/21/2015  PCP: Vic Blackbird, MD   Patient coming from: Home  Chief Complaint: Left arm and leg numbness and tingling  HPI: [name] is a 81 y.o. female with medical history significant for hypertension, hypothyroidism, and vertigo who presents the emergency department for evaluation of left arm and leg numbness and tingling. [name] reports that she was in her usual state of health on 11/19/2015 into she developed numbness and tingling in the left upper and lower extremities in the late morning or early afternoon. Initially, she thought she must have slept on that side, but this sensation persisted throughout the day, and was still present upon waking yesterday. Patient denies any headache or change in vision or hearing associated with this. She endorses history of chronic vertigo, but reports that this has been stable. She denies confusion, loss of coordination, speech difficulty, or weakness. There has been no difficulty swallowing and no coughing or choking. She has never experienced similar symptoms previously. She takes a high-intensity statin and 81 mg aspirin daily. There's been no recent fall or trauma and the patient is not anticoagulated. She believes that her presenting complaints may have been improving some since the time of onset. She denies chest pain, but she did note a fluttering in her chest yesterday. There is no dyspnea or cough.  ED Course: Upon arrival to the ED, patient is found to be afebrile, saturating well on room air, and with vital signs stable. EKG demonstrates a sinus rhythm with a nonspecific T-wave abnormality. Chest x-ray is negative for acute cardiopulmonary disease and chemistry panel features a serum creatinine 1.26 which appears to be consistent with her baseline. CBC is within normal limits and troponin is undetectable. Noncontrast CT head is negative for  acute intracranial abnormality. Cervical spine CT is also negative for acute pathology, but incidental notation is made of a thyroid nodule in the left lobe. Patient remained hemodynamically stable in the emergency department and with no respiratory distress. She will be observed on telemetry unit for ongoing evaluation and management of left-sided paresthesia concerning for possible CVA not detected on the noncontrast head CT.  Review of Systems:  All other systems reviewed and apart from HPI, are negative.  Past Medical History:  Diagnosis Date  . Allergy   . Arthritis   . Colon cancer ([place])    colon ca dx 07/30/09  . History of colon cancer 06/2009   found at time of TCS 06/29/09, 1.2cm sessile cecal polyp, no adjuvent therapy needed  . HTN (hypertension)   . Hyperlipidemia   . Hypothyroidism   . Vertigo     Past Surgical History:  Procedure Laterality Date  . ABDOMINAL HYSTERECTOMY    . COLON SURGERY  07/2009   right hemicolectomy, no residual colon cancer on path  . COLONOSCOPY  07/16/2010   [id number] POLYP-TCS 3 YEARS  . COLONOSCOPY N/A 08/02/2013   Procedure: COLONOSCOPY;  Surgeon: Danie Binder, MD;  Location: [facility];  Service: Endoscopy;  Laterality: N/A;  9:30  . PARTIAL HYSTERECTOMY    . partial thyroidectomy     benign tumors     reports that she has never smoked. She has never used smokeless tobacco. She reports that she does not drink alcohol or use drugs.  No Known Allergies  Family History  Problem Relation Age of Onset  . Colon cancer Mother     >age60  . Arthritis  Mother   . Cancer Mother   . Heart disease Mother   . Hyperlipidemia Mother   . Hypertension Mother   . Heart attack Father   . Heart disease Father   . Diabetes Maternal Aunt   . Hyperlipidemia Daughter   . Hypertension Daughter   . Liver disease Neg Hx      Prior to Admission medications   Medication Sig Start Date End Date Taking? Authorizing Provider  acetaminophen  (TYLENOL) 500 MG tablet Take 1,000 mg by mouth every 8 (eight) hours as needed (cold).   Yes Historical Provider, MD  amLODipine (NORVASC) 5 MG tablet Take 1 tablet by mouth daily. 06/28/10  Yes Historical Provider, MD  aspirin EC 81 MG tablet Take 81 mg by mouth daily as needed for moderate pain.   Yes Historical Provider, MD  atorvastatin (LIPITOR) 80 MG tablet Take 80 mg by mouth daily.   Yes Historical Provider, MD  levothyroxine (SYNTHROID, LEVOTHROID) 50 MCG tablet Take 50 mcg by mouth daily.     Yes Historical Provider, MD  losartan (COZAAR) 50 MG tablet Take 1 tablet (50 mg total) by mouth daily. 11/02/15  Yes Alycia Rossetti, MD  RESTASIS 0.05 % ophthalmic emulsion Place 1 drop into both eyes 2 (two) times daily.  05/17/10  Yes Historical Provider, MD    Physical Exam: Vitals:   11/21/15 1250 11/21/15 1300 11/21/15 1330 11/21/15 1530  BP:  155/73 146/73 153/76  Pulse:  77 76 81  Resp:  17 17 17   Temp:      TempSrc:      SpO2:  100% 98% 97%  Weight: 68 kg (150 lb)     Height: 5\' 3"  (1.6 m)         Constitutional: NAD, calm, comfortable Eyes: PERTLA, lids and conjunctivae normal ENMT: Mucous membranes are moist. Posterior pharynx clear of any exudate or lesions.   Neck: normal, supple, no masses, no thyromegaly Respiratory: clear to auscultation bilaterally, no wheezing, no crackles. Normal respiratory effort.    Cardiovascular: S1 & S2 heard, regular rate and rhythm. No carotid bruits. No significant JVD. Abdomen: No distension, no tenderness, no masses palpated. Bowel sounds normal.  Musculoskeletal: no clubbing / cyanosis. No joint deformity upper and lower extremities. Normal muscle tone.  Skin: no significant rashes, lesions, ulcers. Warm, dry, well-perfused. Neurologic: CN 2-12 grossly intact. Sensation to light touch diminished throughout LUE and LLE, sparing face and neck, DTR normal. Strength 5/5 in all 4 limbs, with subtle weakness in left dorsi- and plantarflexion  relative to the right side.  Psychiatric: Normal judgment and insight. Alert and oriented x 3. Normal mood and affect.     Labs on Admission: I have personally reviewed following labs and imaging studies  CBC:  Recent Labs Lab 11/21/15 1343  WBC 5.7  NEUTROABS 3.6  HGB 13.6  HCT 41.7  MCV 96.1  PLT [id number]   Basic Metabolic Panel:  Recent Labs Lab 11/21/15 1343  NA 140  K 3.9  CL 106  CO2 29  GLUCOSE 92  BUN 17  CREATININE 1.26*  CALCIUM 9.5   GFR: Estimated Creatinine Clearance: 34 mL/min (by C-G formula based on SCr of 1.26 mg/dL). Liver Function Tests:  Recent Labs Lab 11/21/15 1343  AST 18  ALT 16  ALKPHOS 107  BILITOT 0.8  PROT 8.0  ALBUMIN 4.1   No results for input(s): LIPASE, AMYLASE in the last 168 hours. No results for input(s): AMMONIA in the last 168 hours.  Coagulation Profile: No results for input(s): INR, PROTIME in the last 168 hours. Cardiac Enzymes:  Recent Labs Lab 11/21/15 1343  TROPONINI <0.03   BNP (last 3 results) No results for input(s): PROBNP in the last 8760 hours. HbA1C: No results for input(s): HGBA1C in the last 72 hours. CBG: No results for input(s): GLUCAP in the last 168 hours. Lipid Profile: No results for input(s): CHOL, HDL, LDLCALC, TRIG, CHOLHDL, LDLDIRECT in the last 72 hours. Thyroid Function Tests: No results for input(s): TSH, T4TOTAL, FREET4, T3FREE, THYROIDAB in the last 72 hours. Anemia Panel: No results for input(s): VITAMINB12, FOLATE, FERRITIN, TIBC, IRON, RETICCTPCT in the last 72 hours. Urine analysis:    Component Value Date/Time   COLORURINE STRAW (A) 11/21/2015 1526   APPEARANCEUR CLEAR 11/21/2015 1526   LABSPEC <1.005 (L) 11/21/2015 1526   PHURINE 6.0 11/21/2015 1526   GLUCOSEU NEGATIVE 11/21/2015 1526   HGBUR NEGATIVE 11/21/2015 1526   BILIRUBINUR NEGATIVE 11/21/2015 1526   KETONESUR NEGATIVE 11/21/2015 1526   PROTEINUR NEGATIVE 11/21/2015 1526   UROBILINOGEN 0.2 03/12/2012 0022    NITRITE NEGATIVE 11/21/2015 1526   LEUKOCYTESUR SMALL (A) 11/21/2015 1526   Sepsis Labs: @LABRCNTIP (procalcitonin:4,lacticidven:4) )No results found for this or any previous visit (from the past 240 hour(s)).   Radiological Exams on Admission: Dg Chest 2 View  Result Date: 11/21/2015 CLINICAL DATA:  Left-sided tingling and weakness. EXAM: CHEST  2 VIEW COMPARISON:  09/16/2013 FINDINGS: The heart size and mediastinal contours are within normal limits. Both lungs are clear. The visualized skeletal structures are unremarkable. IMPRESSION: No active cardiopulmonary disease. Electronically Signed   By: Kerby Moors M.D.   On: 11/21/2015 14:15   Ct Head Wo Contrast  Result Date: 11/21/2015 CLINICAL DATA:  Left-sided weakness and tingling EXAM: CT HEAD WITHOUT CONTRAST CT CERVICAL SPINE WITHOUT CONTRAST TECHNIQUE: Multidetector CT imaging of the head and cervical spine was performed following the standard protocol without intravenous contrast. Multiplanar CT image reconstructions of the cervical spine were also generated. COMPARISON:  02/21/2013 FINDINGS: CT HEAD FINDINGS Brain: No intracranial hemorrhage, mass effect or midline shift. Stable cerebral atrophy. Stable chronic white matter disease. No definite acute cortical infarction. No mass lesion is noted on this unenhanced scan. Vascular: Mild atherosclerotic calcifications of carotid siphon. Skull: No skull fracture is noted. Sinuses/Orbits: No acute finding. Other: None CT CERVICAL SPINE FINDINGS Alignment: There is normal alignment. Skull base and vertebrae: No acute fracture is noted. Sagittal images of the spine shows degenerative changes C1-C2 articulation. Soft tissues and spinal canal: No prevertebral soft tissue swelling. Spinal canal is patent. Disc levels: Mild disc space flattening with anterior spurring at C3-C4 level. There is about 2 mm anterolisthesis C4 on C5 vertebral body. Mild anterior spurring at C4-C5 level. Minimal anterior spurring  lower endplate C5 and C6 vertebral body. Mild disc space flattening with anterior spurring at C6-C7 level. Upper chest: There is no pneumothorax in visualized lung apices. Other: There is asymmetric low-density nodular enlargement of the right lobe of thyroid gland measures at least 2.7 cm. Further evaluation with thyroid gland ultrasound is recommended to exclude a mass. IMPRESSION: 1. No acute intracranial abnormality. Stable atrophy and chronic white matter disease. 2. No definite acute cortical infarction. 3. No cervical spine acute fracture or subluxation. Degenerative changes as described above. 4. There is nodular enlargement of left lobe of thyroid gland measures at least 2.7 cm. Further evaluation with thyroid gland ultrasound is recommended. Electronically Signed   By: Lahoma Crocker M.D.   On:  11/21/2015 14:37   Ct Cervical Spine Wo Contrast  Result Date: 11/21/2015 CLINICAL DATA:  Left-sided weakness and tingling EXAM: CT HEAD WITHOUT CONTRAST CT CERVICAL SPINE WITHOUT CONTRAST TECHNIQUE: Multidetector CT imaging of the head and cervical spine was performed following the standard protocol without intravenous contrast. Multiplanar CT image reconstructions of the cervical spine were also generated. COMPARISON:  02/21/2013 FINDINGS: CT HEAD FINDINGS Brain: No intracranial hemorrhage, mass effect or midline shift. Stable cerebral atrophy. Stable chronic white matter disease. No definite acute cortical infarction. No mass lesion is noted on this unenhanced scan. Vascular: Mild atherosclerotic calcifications of carotid siphon. Skull: No skull fracture is noted. Sinuses/Orbits: No acute finding. Other: None CT CERVICAL SPINE FINDINGS Alignment: There is normal alignment. Skull base and vertebrae: No acute fracture is noted. Sagittal images of the spine shows degenerative changes C1-C2 articulation. Soft tissues and spinal canal: No prevertebral soft tissue swelling. Spinal canal is patent. Disc levels: Mild disc  space flattening with anterior spurring at C3-C4 level. There is about 2 mm anterolisthesis C4 on C5 vertebral body. Mild anterior spurring at C4-C5 level. Minimal anterior spurring lower endplate C5 and C6 vertebral body. Mild disc space flattening with anterior spurring at C6-C7 level. Upper chest: There is no pneumothorax in visualized lung apices. Other: There is asymmetric low-density nodular enlargement of the right lobe of thyroid gland measures at least 2.7 cm. Further evaluation with thyroid gland ultrasound is recommended to exclude a mass. IMPRESSION: 1. No acute intracranial abnormality. Stable atrophy and chronic white matter disease. 2. No definite acute cortical infarction. 3. No cervical spine acute fracture or subluxation. Degenerative changes as described above. 4. There is nodular enlargement of left lobe of thyroid gland measures at least 2.7 cm. Further evaluation with thyroid gland ultrasound is recommended. Electronically Signed   By: Lahoma Crocker M.D.   On: 11/21/2015 14:37    EKG: Independently reviewed. Sinus rhythm, non-specific T-wave abnormality diffuse leads  Assessment/Plan  1. Left-sided paresthesias, LLE weakness  - Onset of sxs was 11/19/15 and she believes there has been some improvement  - Primary concern is for CVA  - No tPA given that she is outside the window for consideration and sxs are too mild  - Head CT is negative for acute pathology, labs notable only for stable CKD III, and a sinus rhythm on monitor  - Pt had noted occasional palpitations recently, will monitor on telemetry for arrhythmia  - MRI/MRA ordered for the am; confirmed with radiology that MRI tech will come in 9/10 am for this (not typically here on weekend) - Carotid dopplers ordered - TTE ordered for structural heart disease or thrombus  - She takes ASA 81 daily, will increase to 325 qD for now  - Frequent neuro checks; passed the swallow evaluation and diet now ordered - PT, OT, and SLP evals  requested  - Continue the high-intensity statin  - Check A1c and fasting lipid panel in am   2. CKD stage III  - SCr 1.26 on admission and consistent with her apparent baseline  - Monitor, renally-dose medications as needed    3. Hypertension  - At goal currently - Managed with Norvasc and losartan at home - Hold the home antihypertensives for now while evaluating for possible acute/subacute CVA    4. Hypothyroidism  - Appears to be stable, will continue current-dose Synthroid     DVT prophylaxis: sq heparin  Code Status: Full  Family Communication: Children updated at bedside Disposition Plan: Observe on  telemetry Consults called: None Admission status: Observation     Vianne Bulls, MD [facility] Pager [phone]  If 7PM-7AM, please contact night-coverage [email or web address] Password [facility]  11/21/2015, 4:37 PM

## 2015-11-21 NOTE — ED Triage Notes (Signed)
Reports numbness in left arm and leg beginning on Thursday.

## 2015-11-22 ENCOUNTER — Observation Stay (HOSPITAL_BASED_OUTPATIENT_CLINIC_OR_DEPARTMENT_OTHER): Payer: Medicare Other

## 2015-11-22 ENCOUNTER — Observation Stay (HOSPITAL_COMMUNITY): Payer: Medicare Other

## 2015-11-22 DIAGNOSIS — N189 Chronic kidney disease, unspecified: Secondary | ICD-10-CM | POA: Diagnosis not present

## 2015-11-22 DIAGNOSIS — I639 Cerebral infarction, unspecified: Secondary | ICD-10-CM | POA: Diagnosis not present

## 2015-11-22 DIAGNOSIS — I129 Hypertensive chronic kidney disease with stage 1 through stage 4 chronic kidney disease, or unspecified chronic kidney disease: Secondary | ICD-10-CM

## 2015-11-22 DIAGNOSIS — I10 Essential (primary) hypertension: Secondary | ICD-10-CM | POA: Diagnosis not present

## 2015-11-22 DIAGNOSIS — N183 Chronic kidney disease, stage 3 (moderate): Secondary | ICD-10-CM | POA: Diagnosis not present

## 2015-11-22 DIAGNOSIS — R202 Paresthesia of skin: Secondary | ICD-10-CM | POA: Diagnosis not present

## 2015-11-22 DIAGNOSIS — E039 Hypothyroidism, unspecified: Secondary | ICD-10-CM | POA: Diagnosis not present

## 2015-11-22 LAB — ECHOCARDIOGRAM COMPLETE
AOASC: 31 cm
AOPV: 0.93 m/s
AV Area VTI: 2.91 cm2
AV Peak grad: 6 mmHg
AV peak Index: 1.7
AV pk vel: 125 cm/s
CHL CUP PV REG GRAD DIAS: 6 mmHg
CHL CUP REG VEL DIAS: 121 cm/s
E/e' ratio: 11.19
EWDT: 232 ms
FS: 38 % (ref 28–44)
Height: 63 in
IVS/LV PW RATIO, ED: 1.02
LA ID, A-P, ES: 29 mm
LA diam end sys: 29 mm
LA diam index: 1.7 cm/m2
LA vol A4C: 39.9 ml
LAVOL: 38.3 mL
LAVOLIN: 22.4 mL/m2
LV TDI E'LATERAL: 5.98
LV dias vol: 39 mL — AB (ref 46–106)
LV e' LATERAL: 5.98 cm/s
LVDIAVOLIN: 23 mL/m2
LVEEAVG: 11.19
LVEEMED: 11.19
LVOT area: 3.14 cm2
LVOT peak grad rest: 5 mmHg
LVOTD: 20 mm
LVOTPV: 116 cm/s
LVSYSVOL: 12 mL — AB (ref 14–42)
LVSYSVOLIN: 7 mL/m2
MV Dec: 232
MV pk A vel: 95.1 m/s
MV pk E vel: 66.9 m/s
PW: 10.5 mm — AB (ref 0.6–1.1)
RV TAPSE: 21.7 mm
Reg peak vel: 307 cm/s
Simpson's disk: 69
Stroke v: 27 ml
TDI e' medial: 4.24
TR max vel: 307 cm/s
Weight: 2400 oz

## 2015-11-22 LAB — LIPID PANEL
CHOL/HDL RATIO: 2.5 ratio
Cholesterol: 140 mg/dL (ref 0–200)
HDL: 56 mg/dL (ref >40)
LDL CALC: 72 mg/dL (ref 0–99)
TRIGLYCERIDES: 62 mg/dL (ref <150)
VLDL: 12 mg/dL (ref 0–40)

## 2015-11-22 MED ORDER — ASPIRIN 325 MG PO TABS: 325.0000 mg | ORAL_TABLET | Freq: Every day | ORAL | Status: DC

## 2015-11-22 MED ORDER — HYDROXYZINE HCL 25 MG PO TABS
25.0000 mg | ORAL_TABLET | Freq: Once | ORAL | Status: AC
  Administered 2015-11-22: 25 mg via ORAL
  Filled 2015-11-22: qty 1

## 2015-11-22 NOTE — Discharge Summary (Signed)
Physician Discharge Summary  [name] [id number] DOB: 06-Dec-1934 DOA: 11/21/2015  PCP: Vic Blackbird, MD  Admit date: 11/21/2015 Discharge date: 11/22/2015  Admitted From: Home Disposition:  Home  Recommendations for Outpatient Follow-up:  1. Follow up with PCP in 1-2 weeks to discuss admission and possible follow up thyroid ultasound 2. Schedule follow up with Dr. Merlene Laughter of Neurology for 3-4 weeks  Home Health:No Equipment/Devices:None  Discharge Condition: Stable and improved CODE STATUS:Full Diet recommendation: Heart Healthy  Brief/Interim Summary: [name] is a 81 y.o. female with medical history significant for hypertension, hypothyroidism, and vertigo who presents the emergency department for evaluation of left arm and leg numbness and tingling. Ms. Golias reports that she was in her usual state of health on 11/19/2015 into she developed numbness and tingling in the left upper and lower extremities in the late morning or early afternoon. Initially, she thought she must have slept on that side, but this sensation persisted throughout the day, and was still present upon waking yesterday. Patient denies any headache or change in vision or hearing associated with this. She endorses history of chronic vertigo, but reports that this has been stable. She denies confusion, loss of coordination, speech difficulty, or weakness. There has been no difficulty swallowing and no coughing or choking. She has never experienced similar symptoms previously. She takes a high-intensity statin and 81 mg aspirin daily. There's been no recent fall or trauma and the patient is not anticoagulated. She believes that her presenting complaints may have been improving some since the time of onset. She denies chest pain, but she did note a fluttering in her chest yesterday. There is no dyspnea or cough. Patient had MRI/MRA performed day after admission.  She states her weakness  improved to baseline by the time she was discharged.    Discharge Diagnoses:  Principal Problem:   Paresthesia of left upper and lower extremity Active Problems:   Essential hypertension   Hypothyroidism   CKD (chronic kidney disease), stage III   Left leg weakness   Paresthesias  Left-sided paresthesias, LLE weakness  - Onset of sxs was 11/19/15 and she believes there has been some improvement  - Primary concern is for CVA  - No tPA given that she is outside the window for consideration and sxs are too mild  - Head CT is negative for acute pathology, labs notable only for stable CKD III, and a sinus rhythm on monitor  - Pt had noted occasional palpitations recently, will monitor on telemetry for arrhythmia  - MRI/MRA performed - Carotid dopplers performed - TTE showed grade 1 diastolic dysfunction - She takes ASA 81 daily, will increase to 325 qD for now  - Frequent neuro checks; passed the swallow evaluation and diet now ordered - PT, OT, and SLP evals requested  - High-intensity statin continued - discussed with Dr. Leonel Ramsay- will continue full strength aspirin and follow up with neurology in 2-3 weeks    CKD stage III  - SCr 1.26 on admission and consistent with her apparent baseline  - Monitor, renally-dose medications as needed    Hypertension  - At goal currently - Managed with Norvasc and losartan at home    Hypothyroidism  - Appears to be stable, will continue current-dose Synthroid    Discharge Instructions  Discharge Instructions    Call MD for:  persistant dizziness or light-headedness    Complete by:  As directed   Diet - low sodium heart healthy    Complete  by:  As directed   Increase activity slowly    Complete by:  As directed       Medication List    STOP taking these medications   aspirin EC 81 MG tablet Replaced by:  aspirin 325 MG tablet     TAKE these medications   acetaminophen 500 MG tablet Commonly known as:  TYLENOL Take 1,000 mg  by mouth every 8 (eight) hours as needed (cold).   amLODipine 5 MG tablet Commonly known as:  NORVASC Take 1 tablet by mouth daily.   aspirin 325 MG tablet Take 1 tablet (325 mg total) by mouth daily. Replaces:  aspirin EC 81 MG tablet   atorvastatin 80 MG tablet Commonly known as:  LIPITOR Take 80 mg by mouth daily.   levothyroxine 50 MCG tablet Commonly known as:  SYNTHROID, LEVOTHROID Take 50 mcg by mouth daily.   losartan 50 MG tablet Commonly known as:  COZAAR Take 1 tablet (50 mg total) by mouth daily.   RESTASIS 0.05 % ophthalmic emulsion Generic drug:  cycloSPORINE Place 1 drop into both eyes 2 (two) times daily.      Follow-up Information    DOONQUAH, KOFI, MD. Schedule an appointment as soon as possible for a visit in 3 day(s).   Specialty:  Neurology Contact information: [address] [place] [address] [phone]        Vic Blackbird, MD Follow up in 2 week(s).   Specialty:  Family Medicine Contact information: [place] [id number] [phone]          No Known Allergies  Consultations:  None   Procedures/Studies: Dg Chest 2 View  Result Date: 11/21/2015 CLINICAL DATA:  Left-sided tingling and weakness. EXAM: CHEST  2 VIEW COMPARISON:  09/16/2013 FINDINGS: The heart size and mediastinal contours are within normal limits. Both lungs are clear. The visualized skeletal structures are unremarkable. IMPRESSION: No active cardiopulmonary disease. Electronically Signed   By: Kerby Moors M.D.   On: 11/21/2015 14:15   Ct Head Wo Contrast  Result Date: 11/21/2015 CLINICAL DATA:  Left-sided weakness and tingling EXAM: CT HEAD WITHOUT CONTRAST CT CERVICAL SPINE WITHOUT CONTRAST TECHNIQUE: Multidetector CT imaging of the head and cervical spine was performed following the standard protocol without intravenous contrast. Multiplanar CT image reconstructions of the cervical spine were also generated. COMPARISON:   02/21/2013 FINDINGS: CT HEAD FINDINGS Brain: No intracranial hemorrhage, mass effect or midline shift. Stable cerebral atrophy. Stable chronic white matter disease. No definite acute cortical infarction. No mass lesion is noted on this unenhanced scan. Vascular: Mild atherosclerotic calcifications of carotid siphon. Skull: No skull fracture is noted. Sinuses/Orbits: No acute finding. Other: None CT CERVICAL SPINE FINDINGS Alignment: There is normal alignment. Skull base and vertebrae: No acute fracture is noted. Sagittal images of the spine shows degenerative changes C1-C2 articulation. Soft tissues and spinal canal: No prevertebral soft tissue swelling. Spinal canal is patent. Disc levels: Mild disc space flattening with anterior spurring at C3-C4 level. There is about 2 mm anterolisthesis C4 on C5 vertebral body. Mild anterior spurring at C4-C5 level. Minimal anterior spurring lower endplate C5 and C6 vertebral body. Mild disc space flattening with anterior spurring at C6-C7 level. Upper chest: There is no pneumothorax in visualized lung apices. Other: There is asymmetric low-density nodular enlargement of the right lobe of thyroid gland measures at least 2.7 cm. Further evaluation with thyroid gland ultrasound is recommended to exclude a mass. IMPRESSION: 1. No acute intracranial  abnormality. Stable atrophy and chronic white matter disease. 2. No definite acute cortical infarction. 3. No cervical spine acute fracture or subluxation. Degenerative changes as described above. 4. There is nodular enlargement of left lobe of thyroid gland measures at least 2.7 cm. Further evaluation with thyroid gland ultrasound is recommended. Electronically Signed   By: Lahoma Crocker M.D.   On: 11/21/2015 14:37   Ct Cervical Spine Wo Contrast  Result Date: 11/21/2015 CLINICAL DATA:  Left-sided weakness and tingling EXAM: CT HEAD WITHOUT CONTRAST CT CERVICAL SPINE WITHOUT CONTRAST TECHNIQUE: Multidetector CT imaging of the head and  cervical spine was performed following the standard protocol without intravenous contrast. Multiplanar CT image reconstructions of the cervical spine were also generated. COMPARISON:  02/21/2013 FINDINGS: CT HEAD FINDINGS Brain: No intracranial hemorrhage, mass effect or midline shift. Stable cerebral atrophy. Stable chronic white matter disease. No definite acute cortical infarction. No mass lesion is noted on this unenhanced scan. Vascular: Mild atherosclerotic calcifications of carotid siphon. Skull: No skull fracture is noted. Sinuses/Orbits: No acute finding. Other: None CT CERVICAL SPINE FINDINGS Alignment: There is normal alignment. Skull base and vertebrae: No acute fracture is noted. Sagittal images of the spine shows degenerative changes C1-C2 articulation. Soft tissues and spinal canal: No prevertebral soft tissue swelling. Spinal canal is patent. Disc levels: Mild disc space flattening with anterior spurring at C3-C4 level. There is about 2 mm anterolisthesis C4 on C5 vertebral body. Mild anterior spurring at C4-C5 level. Minimal anterior spurring lower endplate C5 and C6 vertebral body. Mild disc space flattening with anterior spurring at C6-C7 level. Upper chest: There is no pneumothorax in visualized lung apices. Other: There is asymmetric low-density nodular enlargement of the right lobe of thyroid gland measures at least 2.7 cm. Further evaluation with thyroid gland ultrasound is recommended to exclude a mass. IMPRESSION: 1. No acute intracranial abnormality. Stable atrophy and chronic white matter disease. 2. No definite acute cortical infarction. 3. No cervical spine acute fracture or subluxation. Degenerative changes as described above. 4. There is nodular enlargement of left lobe of thyroid gland measures at least 2.7 cm. Further evaluation with thyroid gland ultrasound is recommended. Electronically Signed   By: Lahoma Crocker M.D.   On: 11/21/2015 14:37   Mr Brain Wo Contrast  Result Date:  11/22/2015 CLINICAL DATA:  New onset left-sided weakness over the past 2 days. EXAM: MRI HEAD WITHOUT CONTRAST TECHNIQUE: Multiplanar, multiecho pulse sequences of the brain and surrounding structures were obtained without intravenous contrast. COMPARISON:  CT head without contrast 11/21/2015 and 02/21/2013 FINDINGS: Brain: The diffusion-weighted images demonstrate no acute or subacute infarction. Moderate atrophy and advanced diffuse white matter disease is again seen bilaterally. There is extensive ischemic change within the basal ganglia and thalami bilaterally. Dilated perivascular spaces are present. White matter changes extend into the brainstem. No acute hemorrhage or mass lesion is present. Focal areas of susceptibility are present within the thalami bilaterally and left cerebellum. Other punctate areas of susceptibility are present as well. Ventricles are proportionate to the degree of atrophy. Vascular: Flow is present in the major intracranial arteries. Skull and upper cervical spine: The skullbase is within normal limits. Midline sagittal images are unremarkable. A disc osteophyte complex is noted at C3-4. Sinuses/Orbits: Mild mucosal thickening is present in the maxillary sinuses bilaterally. The remaining paranasal sinuses and the mastoid air cells are clear. The globes and orbits are intact. Other: IMPRESSION: 1. No acute intracranial abnormality. 2. Moderate atrophy and advanced diffuse white matter disease. This likely  reflects the sequela of chronic microvascular ischemia. 3. Multiple foci of susceptibility suggesting remote punctate blood products. This raises concern for underlying vasculitis. Similar findings can be seen with advanced atherosclerosis. 4. Probable disc osteophyte complex at C3-4. 5. Minimal sinus disease. Electronically Signed   By: San Morelle M.D.   On: 11/22/2015 10:55   US Carotid Bilateral (at [facility] And [facility] Only)  Result Date: 11/22/2015 CLINICAL DATA:   81 year old female with a history of stroke. Cardiovascular risk factors include hypertension, known stroke/TIA, hyperlipidemia EXAM: BILATERAL CAROTID DUPLEX ULTRASOUND TECHNIQUE: Pearline Cables scale imaging, color Doppler and duplex ultrasound were performed of bilateral carotid and vertebral arteries in the neck. COMPARISON:  No prior duplex FINDINGS: Criteria: Quantification of carotid stenosis is based on velocity parameters that correlate the residual internal carotid diameter with NASCET-based stenosis levels, using the diameter of the distal internal carotid lumen as the denominator for stenosis measurement. The following velocity measurements were obtained: RIGHT ICA:  Systolic 68 cm/sec, Diastolic 17 cm/sec CCA:  70 set cm/sec SYSTOLIC ICA/CCA RATIO:  0.9 ECA:  71 cm/sec LEFT ICA:  Systolic 89 cm/sec, Diastolic 17 cm/sec CCA:  99 cm/sec SYSTOLIC ICA/CCA RATIO:  0.9 ECA:  64 cm/sec Right Brachial SBP: Not acquired Left Brachial SBP: Not acquired RIGHT CAROTID ARTERY: No significant calcifications of the right common carotid artery. Intermediate waveform maintained. Heterogeneous and partially calcified plaque at the right carotid bifurcation. No significant lumen shadowing. Low resistance waveform of the right ICA. Mild tortuosity RIGHT VERTEBRAL ARTERY: Antegrade flow with low resistance waveform. LEFT CAROTID ARTERY: No significant calcifications of the left common carotid artery. Intermediate waveform maintained. Heterogeneous and partially calcified plaque at the left carotid bifurcation without significant lumen shadowing. Low resistance waveform of the left ICA. Mild tortuosity LEFT VERTEBRAL ARTERY:  Antegrade flow with low resistance waveform. IMPRESSION: Color duplex indicates minimal heterogeneous and calcified plaque, with no hemodynamically significant stenosis by duplex criteria in the extracranial cerebrovascular circulation. Signed, Dulcy Fanny. Earleen Newport, DO [facility]  [facility] Electronically Signed   By: Corrie Mckusick D.O.   On: 11/22/2015 08:30   Mr Jodene Nam Head/brain [id number] Cm  Result Date: 11/22/2015 CLINICAL DATA:  New onset left-sided weakness. Chronic white matter changes on MRI. EXAM: MRA HEAD WITHOUT CONTRAST TECHNIQUE: Angiographic images of the Circle of Willis were obtained using MRA technique without intravenous contrast. COMPARISON:  MRI brain from the same day. CT head without contrast 11/21/2015 FINDINGS: The internal carotid arteries are within normal limits from the high cervical segments through the ICA termini bilaterally. The A1 and M1 segments are normal. The anterior communicating artery is patent. MCA bifurcations are intact. There is mild attenuation of distal MCA branch vessels bilaterally without a significant proximal stenosis or occlusion. There is no aneurysm. The left vertebral artery is dominant. The PICA origins are visualized and normal bilaterally. The basilar artery is within normal limits. Both posterior cerebral arteries originate the basilar tip. Posterior communicating arteries contribute bilaterally. There is mild narrowing of the mid left vertebral artery and distal branch vessels bilaterally. IMPRESSION: 1. Mild distal small vessel disease without significant proximal stenosis, aneurysm, or branch vessel occlusion. Electronically Signed   By: San Morelle M.D.   On: 11/22/2015 10:57       Subjective: Patient seen and evaluated and reports significant improvement in her weakness.  Discussed the finding of a thyroid nodule with patient and she voices understanding about follow up.  Family is present bedside and asked appropriate questions about  follow up, pathophysiology as well as long term treatment.  Discharge Exam: Vitals:   11/22/15 0503 11/22/15 1104  BP: 131/67 (!) 155/77  Pulse:  73  Resp: 16 18  Temp: 97.9 F (36.6 C) 98 F (36.7 C)   Vitals:   11/22/15 0104 11/22/15 0304 11/22/15 0503 11/22/15  1104  BP: (!) 114/55 131/69 131/67 (!) 155/77  Pulse: 82 79  73  Resp: 20 16 16 18   Temp: 98.6 F (37 C) 98.1 F (36.7 C) 97.9 F (36.6 C) 98 F (36.7 C)  TempSrc: Oral Oral Oral Oral  SpO2: 98% 97% 96% 100%  Weight:      Height:        General: Pt is alert, awake, not in acute distress Cardiovascular: RRR, [id number] +, II/VI systolic murmur heard best in the left sternal border, no rubs, no gallops Respiratory: CTA bilaterally, no wheezing, no rhonchi Abdominal: Soft, NT, ND, bowel sounds + Extremities: no edema, no cyanosis Neuro: full range of motion, 5/5 strength in upper and lower extremities bilaterally, no weakness noted, able to ambulate without concern. CN II-XII grossly intact   The results of significant diagnostics from this hospitalization (including imaging, microbiology, ancillary and laboratory) are listed below for reference.     Microbiology: No results found for this or any previous visit (from the past 240 hour(s)).   Labs: BNP (last 3 results) No results for input(s): BNP in the last 8760 hours. Basic Metabolic Panel:  Recent Labs Lab 11/21/15 1343  NA 140  K 3.9  CL 106  CO2 29  GLUCOSE 92  BUN 17  CREATININE 1.26*  CALCIUM 9.5   Liver Function Tests:  Recent Labs Lab 11/21/15 1343  AST 18  ALT 16  ALKPHOS 107  BILITOT 0.8  PROT 8.0  ALBUMIN 4.1   No results for input(s): LIPASE, AMYLASE in the last 168 hours. No results for input(s): AMMONIA in the last 168 hours. CBC:  Recent Labs Lab 11/21/15 1343  WBC 5.7  NEUTROABS 3.6  HGB 13.6  HCT 41.7  MCV 96.1  PLT 358   Cardiac Enzymes:  Recent Labs Lab 11/21/15 1343  TROPONINI <0.03   BNP: Invalid input(s): POCBNP CBG: No results for input(s): GLUCAP in the last 168 hours. D-Dimer No results for input(s): DDIMER in the last 72 hours. Hgb A1c No results for input(s): HGBA1C in the last 72 hours. Lipid Profile  Recent Labs  11/22/15 0613  CHOL 140  HDL 56   LDLCALC 72  TRIG 62  CHOLHDL 2.5   Thyroid function studies No results for input(s): TSH, T4TOTAL, T3FREE, THYROIDAB in the last 72 hours.  Invalid input(s): FREET3 Anemia work up No results for input(s): VITAMINB12, FOLATE, FERRITIN, TIBC, IRON, RETICCTPCT in the last 72 hours. Urinalysis    Component Value Date/Time   COLORURINE STRAW (A) 11/21/2015 1526   APPEARANCEUR CLEAR 11/21/2015 1526   LABSPEC <1.005 (L) 11/21/2015 1526   PHURINE 6.0 11/21/2015 1526   GLUCOSEU NEGATIVE 11/21/2015 1526   HGBUR NEGATIVE 11/21/2015 1526   BILIRUBINUR NEGATIVE 11/21/2015 1526   KETONESUR NEGATIVE 11/21/2015 1526   PROTEINUR NEGATIVE 11/21/2015 1526   UROBILINOGEN 0.2 03/12/2012 0022   NITRITE NEGATIVE 11/21/2015 1526   LEUKOCYTESUR SMALL (A) 11/21/2015 1526   Sepsis Labs Invalid input(s): PROCALCITONIN,  WBC,  LACTICIDVEN Microbiology No results found for this or any previous visit (from the past 240 hour(s)).   Time coordinating discharge: Less than 30 minutes  SIGNED:   Newman Pies, MD  [facility] 11/22/2015, 3:58 PM Pager [phone] If 7PM-7AM, please contact night-coverage [email or web address] Password [email or web address]

## 2015-11-22 NOTE — Progress Notes (Signed)
Pt resting in bed comfortably, denies any pain or discomfort. Neuro checks Q2 complete and within normal findings.

## 2015-11-22 NOTE — Progress Notes (Signed)
RN received call from Radiology.  Patient states she "feels nervous about MRI."  Dr. Jearld Shines notified via text page.

## 2015-11-22 NOTE — Progress Notes (Signed)
SLP Cancellation Note  Patient Details Name: PURVI RIDGELL MRN: [id number] DOB: February 24, 1935   Cancelled treatment:       Reason Eval/Treat Not Completed: SLP screened, no needs identified, will sign off; Pt screened at bedside and reports no changes in cognition, speech, language, or swallowing. She passed RN swallow screen and is tolerating diet without incident. Pt reports that her only "change" is continued decreased sensation in left forearm. She endorses a history of vertigo (seen by ENT, Dr. Benjamine Mola) and only drives short distances. No SLP needs identified at this time. Reconsult as needed. Above to RN.  Thank you,  Genene Churn, [place]    [place] 11/22/2015, 9:16 AM

## 2015-11-22 NOTE — Progress Notes (Signed)
Patient's IV removed.  Site WNL.  AVS reviewed with patient.  Verbalized understanding of discharge instructions, physician follow-up, medications.  Patient transported by NT via w/c to main entrance for discharge.  Patient stable at time of discharge. 

## 2015-11-22 NOTE — Care Management Obs Status (Signed)
[place] NOTIFICATION   Patient Details  Name: SURENITY SHAYA MRN: [id number] Date of Birth: 1934-03-19   Medicare Observation Status Notification Given:  Yes  Family member at bedside able to sign for patient.  Briant Sites, RN 11/22/2015, 2:21 PM

## 2015-11-22 NOTE — Progress Notes (Signed)
Patient off the floor for VS and neuro checks.

## 2015-11-23 ENCOUNTER — Other Ambulatory Visit (HOSPITAL_COMMUNITY): Payer: Self-pay | Admitting: Nephrology

## 2015-11-23 DIAGNOSIS — N183 Chronic kidney disease, stage 3 unspecified: Secondary | ICD-10-CM

## 2015-11-23 LAB — HEMOGLOBIN A1C
Hgb A1c MFr Bld: 5.7 % — ABNORMAL HIGH (ref 4.8–5.6)
MEAN PLASMA GLUCOSE: 117 mg/dL

## 2015-12-03 ENCOUNTER — Ambulatory Visit (HOSPITAL_COMMUNITY)
Admission: RE | Admit: 2015-12-03 | Discharge: 2015-12-03 | Disposition: A | Discharge status: Home / Self Care | Payer: Medicare Other | Source: Ambulatory Visit | Attending: Nephrology | Admitting: Nephrology

## 2015-12-03 DIAGNOSIS — N183 Chronic kidney disease, stage 3 unspecified: Secondary | ICD-10-CM

## 2015-12-03 DIAGNOSIS — N281 Cyst of kidney, acquired: Secondary | ICD-10-CM | POA: Diagnosis not present

## 2015-12-14 DIAGNOSIS — I10 Essential (primary) hypertension: Secondary | ICD-10-CM | POA: Diagnosis not present

## 2015-12-14 DIAGNOSIS — Z79899 Other long term (current) drug therapy: Secondary | ICD-10-CM | POA: Diagnosis not present

## 2015-12-14 DIAGNOSIS — N183 Chronic kidney disease, stage 3 (moderate): Secondary | ICD-10-CM | POA: Diagnosis not present

## 2015-12-14 DIAGNOSIS — D509 Iron deficiency anemia, unspecified: Secondary | ICD-10-CM | POA: Diagnosis not present

## 2015-12-14 DIAGNOSIS — E559 Vitamin D deficiency, unspecified: Secondary | ICD-10-CM | POA: Diagnosis not present

## 2015-12-15 ENCOUNTER — Encounter: Payer: Self-pay | Admitting: Family Medicine

## 2015-12-15 ENCOUNTER — Ambulatory Visit (INDEPENDENT_AMBULATORY_CARE_PROVIDER_SITE_OTHER): Payer: Medicare Other | Admitting: Family Medicine

## 2015-12-15 VITALS — BP 136/76 | HR 88 | Temp 98.3°F | Resp 12 | Ht 63.0 in | Wt 148.0 lb

## 2015-12-15 DIAGNOSIS — R202 Paresthesia of skin: Secondary | ICD-10-CM | POA: Diagnosis not present

## 2015-12-15 DIAGNOSIS — F5101 Primary insomnia: Secondary | ICD-10-CM

## 2015-12-15 DIAGNOSIS — K5901 Slow transit constipation: Secondary | ICD-10-CM

## 2015-12-15 DIAGNOSIS — Z23 Encounter for immunization: Secondary | ICD-10-CM | POA: Diagnosis not present

## 2015-12-15 DIAGNOSIS — E041 Nontoxic single thyroid nodule: Secondary | ICD-10-CM

## 2015-12-15 DIAGNOSIS — E039 Hypothyroidism, unspecified: Secondary | ICD-10-CM | POA: Diagnosis not present

## 2015-12-15 DIAGNOSIS — G4700 Insomnia, unspecified: Secondary | ICD-10-CM | POA: Insufficient documentation

## 2015-12-15 MED ORDER — AMLODIPINE BESYLATE 5 MG PO TABS: 5.0000 mg | ORAL_TABLET | Freq: Every day | ORAL | 2 refills | Status: DC

## 2015-12-15 MED ORDER — TRAZODONE HCL 50 MG PO TABS: 25.0000 mg | ORAL_TABLET | Freq: Every evening | ORAL | 3 refills | Status: DC | PRN

## 2015-12-15 MED ORDER — ATORVASTATIN CALCIUM 80 MG PO TABS: 80.0000 mg | ORAL_TABLET | Freq: Every day | ORAL | 2 refills | Status: DC

## 2015-12-15 NOTE — Assessment & Plan Note (Signed)
Trial of trazodone 25mg 

## 2015-12-15 NOTE — Assessment & Plan Note (Signed)
Concern for TIA, schedule with neurology Continue full dose ASA

## 2015-12-15 NOTE — Patient Instructions (Signed)
Thyroid ultrasound to be done  For sleep try trazodone take 1 hour before bedtime For constipation- Metamucil or Fiber One powder  Neurology Referral

## 2015-12-15 NOTE — Assessment & Plan Note (Signed)
Metamucil or fiber one Also advised miralax if these dont help

## 2015-12-15 NOTE — Progress Notes (Signed)
   Subjective:    Patient ID: [name], female    DOB: 20-Jul-1934, 81 y.o.   MRN: [id number]  Patient presents for Hospital F/U (stroke)  Pt here for Hospital follow up for left sided paresthesia concern for CVA. No TPA administered, old sequela white matter disease found on MRI, carotids minimal plaque buildup. Changed to ASA 325mg . Evaluated by PT/OT no home therapy needed. Still has occasional numbness in left arm but able to use normally, no speech or swallowing difficulty. Needs referral for neurology    Constipation- would like to try fiber supplement   Thryoid nodule- incidental finding on CT needs dedicated Ultrasound of neck   Insomnia- history of insomnia for years, was taking what sounds like low dose benzo in past, can never sleep more than 3 hours at a time    Review Of Systems:  GEN- denies fatigue, fever, weight loss,weakness, recent illness HEENT- denies eye drainage, change in vision, nasal discharge, CVS- denies chest pain, palpitations RESP- denies SOB, cough, wheeze ABD- denies N/V, +change in stools, abd pain GU- denies dysuria, hematuria, dribbling, incontinence MSK- denies joint pain, muscle aches, injury Neuro- denies headache, dizziness, syncope, seizure activity       Objective:    BP 136/76 (BP Location: Left Arm, Patient Position: Sitting, Cuff Size: Normal)   Pulse 88   Temp 98.3 F (36.8 C) (Oral)   Resp 12   Ht 5\' 3"  (1.6 m)   Wt 148 lb (67.1 kg)   BMI 26.22 kg/m  GEN- NAD, alert and oriented x3 HEENT- PERRL, EOMI, non injected sclera, pink conjunctiva, MMM, oropharynx clear Neck- Supple, no thyromegaly CVS- RRR, no murmur RESP-CTAB ABD-NABS,soft,NT,ND Neuro- CNII-XII in tact, moving upper and lower ext equally, cautious gait no ataxia, normal speech EXT- No edema Pulses- Radial, DP- 2+        Assessment & Plan:      Problem List Items Addressed This Visit    Paresthesia of left upper and lower extremity    Concern  for TIA, schedule with neurology Continue full dose ASA      Relevant Orders   Ambulatory referral to Neurology   Insomnia    Trial of trazodone 25mg        Hypothyroidism - Primary    Check TFT ultrasound for nodules      Relevant Orders   T4, free   TSH   T3, free   US Soft Tissue Head/Neck   Constipation    Metamucil or fiber one Also advised miralax if these dont help        Other Visit Diagnoses    Need for prophylactic vaccination and inoculation against influenza       Relevant Orders   Flu Vaccine QUAD 36+ mos PF IM (Fluarix & Fluzone Quad PF) (Completed)   Thyroid nodule       Relevant Orders   US Soft Tissue Head/Neck      Note: This dictation was prepared with Dragon dictation along with smaller phrase technology. Any transcriptional errors that result from this process are unintentional.

## 2015-12-15 NOTE — Assessment & Plan Note (Signed)
Check TFT ultrasound for nodules

## 2015-12-16 LAB — TSH: TSH: 0.16 m[IU]/L — AB

## 2015-12-16 LAB — T4, FREE: FREE T4: 1.3 ng/dL (ref 0.8–1.8)

## 2015-12-16 LAB — T3, FREE: T3 FREE: 2.2 pg/mL — AB (ref 2.3–4.2)

## 2015-12-17 ENCOUNTER — Other Ambulatory Visit: Payer: Self-pay | Admitting: *Deleted

## 2015-12-17 DIAGNOSIS — E039 Hypothyroidism, unspecified: Secondary | ICD-10-CM

## 2015-12-22 ENCOUNTER — Ambulatory Visit (HOSPITAL_COMMUNITY)
Admission: RE | Admit: 2015-12-22 | Discharge: 2015-12-22 | Disposition: A | Discharge status: Home / Self Care | Payer: Medicare Other | Source: Ambulatory Visit | Attending: Family Medicine | Admitting: Family Medicine

## 2015-12-22 DIAGNOSIS — Z9889 Other specified postprocedural states: Secondary | ICD-10-CM | POA: Insufficient documentation

## 2015-12-22 DIAGNOSIS — E041 Nontoxic single thyroid nodule: Secondary | ICD-10-CM | POA: Diagnosis not present

## 2015-12-22 DIAGNOSIS — E039 Hypothyroidism, unspecified: Secondary | ICD-10-CM | POA: Diagnosis not present

## 2015-12-22 DIAGNOSIS — E870 Hyperosmolality and hypernatremia: Secondary | ICD-10-CM | POA: Diagnosis not present

## 2015-12-22 DIAGNOSIS — N183 Chronic kidney disease, stage 3 (moderate): Secondary | ICD-10-CM | POA: Diagnosis not present

## 2015-12-24 ENCOUNTER — Other Ambulatory Visit: Payer: Self-pay | Admitting: *Deleted

## 2015-12-24 DIAGNOSIS — E041 Nontoxic single thyroid nodule: Secondary | ICD-10-CM

## 2015-12-29 DIAGNOSIS — N183 Chronic kidney disease, stage 3 (moderate): Secondary | ICD-10-CM | POA: Diagnosis not present

## 2015-12-29 DIAGNOSIS — E039 Hypothyroidism, unspecified: Secondary | ICD-10-CM | POA: Diagnosis not present

## 2015-12-29 DIAGNOSIS — I639 Cerebral infarction, unspecified: Secondary | ICD-10-CM | POA: Diagnosis not present

## 2015-12-29 DIAGNOSIS — I10 Essential (primary) hypertension: Secondary | ICD-10-CM | POA: Diagnosis not present

## 2015-12-29 DIAGNOSIS — H81399 Other peripheral vertigo, unspecified ear: Secondary | ICD-10-CM | POA: Diagnosis not present

## 2016-01-04 ENCOUNTER — Other Ambulatory Visit: Payer: Self-pay | Admitting: Family Medicine

## 2016-01-04 DIAGNOSIS — Z1231 Encounter for screening mammogram for malignant neoplasm of breast: Secondary | ICD-10-CM

## 2016-01-06 ENCOUNTER — Other Ambulatory Visit: Payer: Self-pay | Admitting: Family Medicine

## 2016-01-06 DIAGNOSIS — E041 Nontoxic single thyroid nodule: Secondary | ICD-10-CM

## 2016-01-11 ENCOUNTER — Ambulatory Visit (HOSPITAL_COMMUNITY)
Admission: RE | Admit: 2016-01-11 | Discharge: 2016-01-11 | Disposition: A | Discharge status: Home / Self Care | Payer: Medicare Other | Source: Ambulatory Visit | Attending: Family Medicine | Admitting: Family Medicine

## 2016-01-11 ENCOUNTER — Encounter (HOSPITAL_COMMUNITY): Payer: Self-pay

## 2016-01-11 DIAGNOSIS — E041 Nontoxic single thyroid nodule: Secondary | ICD-10-CM | POA: Diagnosis not present

## 2016-01-11 MED ORDER — LIDOCAINE HCL (PF) 2 % IJ SOLN
INTRAMUSCULAR | Status: AC
  Administered 2016-01-11: 10 mL
  Filled 2016-01-11: qty 10

## 2016-01-11 NOTE — Procedures (Signed)
PreOperative Dx: LEFT thyroid nodule Postoperative Dx: LEFT thyroid nodule Procedure:   US guided FNA of LEFT thyroid nodule Radiologist:  Allicia Culley Anesthesia:  2 ml of 2% lidocaine Specimen:  FNA x 3  EBL:   < 1 ml Complications: None 

## 2016-01-11 NOTE — Discharge Instructions (Signed)

## 2016-01-21 ENCOUNTER — Ambulatory Visit (HOSPITAL_COMMUNITY)
Admission: RE | Admit: 2016-01-21 | Discharge: 2016-01-21 | Disposition: A | Discharge status: Home / Self Care | Payer: Medicare Other | Source: Ambulatory Visit | Attending: Family Medicine | Admitting: Family Medicine

## 2016-01-21 DIAGNOSIS — Z1231 Encounter for screening mammogram for malignant neoplasm of breast: Secondary | ICD-10-CM | POA: Diagnosis not present

## 2016-02-01 ENCOUNTER — Other Ambulatory Visit: Payer: Medicare Other

## 2016-02-01 DIAGNOSIS — E039 Hypothyroidism, unspecified: Secondary | ICD-10-CM

## 2016-02-01 LAB — TSH: TSH: 0.57 m[IU]/L

## 2016-02-01 LAB — T3, FREE: T3 FREE: 2.5 pg/mL (ref 2.3–4.2)

## 2016-02-01 LAB — T4, FREE: FREE T4: 1.4 ng/dL (ref 0.8–1.8)

## 2016-02-02 ENCOUNTER — Other Ambulatory Visit: Payer: Self-pay | Admitting: *Deleted

## 2016-02-02 MED ORDER — LEVOTHYROXINE SODIUM 25 MCG PO CAPS: 25.0000 ug | ORAL_CAPSULE | Freq: Every day | ORAL | 3 refills | Status: DC

## 2016-02-08 DIAGNOSIS — D509 Iron deficiency anemia, unspecified: Secondary | ICD-10-CM | POA: Diagnosis not present

## 2016-02-08 DIAGNOSIS — I10 Essential (primary) hypertension: Secondary | ICD-10-CM | POA: Diagnosis not present

## 2016-02-08 DIAGNOSIS — N183 Chronic kidney disease, stage 3 (moderate): Secondary | ICD-10-CM | POA: Diagnosis not present

## 2016-02-08 DIAGNOSIS — E559 Vitamin D deficiency, unspecified: Secondary | ICD-10-CM | POA: Diagnosis not present

## 2016-02-08 DIAGNOSIS — Z79899 Other long term (current) drug therapy: Secondary | ICD-10-CM | POA: Diagnosis not present

## 2016-03-03 ENCOUNTER — Telehealth: Payer: Self-pay | Admitting: Oncology

## 2016-03-03 NOTE — Telephone Encounter (Signed)
Patient called to have all appointments canceled. Per the patient he found a PCP that he plans to follow up with.

## 2016-03-04 ENCOUNTER — Ambulatory Visit (INDEPENDENT_AMBULATORY_CARE_PROVIDER_SITE_OTHER): Payer: Medicare Other | Admitting: Family Medicine

## 2016-03-04 ENCOUNTER — Encounter: Payer: Self-pay | Admitting: Family Medicine

## 2016-03-04 VITALS — BP 138/76 | HR 80 | Temp 98.7°F | Resp 18 | Wt 146.0 lb

## 2016-03-04 DIAGNOSIS — F5101 Primary insomnia: Secondary | ICD-10-CM

## 2016-03-04 DIAGNOSIS — I10 Essential (primary) hypertension: Secondary | ICD-10-CM | POA: Diagnosis not present

## 2016-03-04 DIAGNOSIS — K5901 Slow transit constipation: Secondary | ICD-10-CM | POA: Diagnosis not present

## 2016-03-04 DIAGNOSIS — E039 Hypothyroidism, unspecified: Secondary | ICD-10-CM

## 2016-03-04 MED ORDER — ASPIRIN EC 81 MG PO TBEC: 162.0000 mg | DELAYED_RELEASE_TABLET | Freq: Every day | ORAL | Status: DC

## 2016-03-04 MED ORDER — POLYETHYLENE GLYCOL 3350 17 GM/SCOOP PO POWD: 17.0000 g | Freq: Every day | ORAL | 1 refills | Status: DC

## 2016-03-04 MED ORDER — ATORVASTATIN CALCIUM 40 MG PO TABS: 80.0000 mg | ORAL_TABLET | Freq: Every day | ORAL | 3 refills | Status: DC

## 2016-03-04 MED ORDER — LOSARTAN POTASSIUM 50 MG PO TABS: 50.0000 mg | ORAL_TABLET | Freq: Every day | ORAL | 3 refills | Status: DC

## 2016-03-04 NOTE — Progress Notes (Signed)
   Subjective:    Patient ID: [name], female    DOB: [birth year], 81 y.o.   MRN: [id number]  Patient presents for routine check up (not fasting)    Pt here to f/u chronic medical problems. Medications reviewed  At last visit sent to neurology for f/u at hospitalizations and TIA. She was changed to aspirin 162 mg otherwise no other changes   Also sent for thyroid US which required biopsy of large nodule on left side , biopsy was BENIGN, needs repeat US in 1 year  TFT at goal continue 6mcg of synthroid  She would like some changes on her medications. She feels like the cholesterol pill is to be she will like to take 2 of the 40 mg of Lipitor  Her constipation she tried the Metamucil but does not like the taste it is too thick.    Review Of Systems:  GEN- denies fatigue, fever, weight loss,weakness, recent illness HEENT- denies eye drainage, change in vision, nasal discharge, CVS- denies chest pain, palpitations RESP- denies SOB, cough, wheeze ABD- denies N/V,+ change in stools, abd pain GU- denies dysuria, hematuria, dribbling, incontinence MSK- denies joint pain, muscle aches, injury Neuro- denies headache, dizziness, syncope, seizure activity       Objective:    BP 138/76 (BP Location: Right Arm, Patient Position: Sitting, Cuff Size: Normal)   Pulse 80   Temp 98.7 F (37.1 C) (Oral)   Resp 18   Wt 146 lb (66.2 kg)   BMI 25.86 kg/m  GEN- NAD, alert and oriented x3 HEENT- PERRL, EOMI, non injected sclera, pink conjunctiva, MMM, oropharynx clear Neck- Supple, no thyromegaly CVS- RRR, no murmur RESP-CTAB ABD-NABS,soft,NT,ND EXT- No edema Pulses- Radial  2+        Assessment & Plan:      Problem List Items Addressed This Visit    Insomnia - Primary    Review of medication she states that the 25 mg does not help very much advised her to take the entire 50 mg of the trazodone. She has been scared to try this and she thinks that she will be too sleepy  into the daytime. Her daughter is going to stay with her when she tries the entire dose      Hypothyroidism    Thyroid nodules benign she'll need a repeat ultrasound in one year      Essential hypertension    Blood pressure is well-controlled and change her medication.      Relevant Medications   aspirin EC 81 MG tablet   losartan (COZAAR) 50 MG tablet   atorvastatin (LIPITOR) 40 MG tablet   Constipation    We'll switch her back to the MiraLAX she can take a half a capful daily to keep her bowels regular         Note: This dictation was prepared with Dragon dictation along with smaller phrase technology. Any transcriptional errors that result from this process are unintentional.

## 2016-03-04 NOTE — Assessment & Plan Note (Signed)
We'll switch her back to the MiraLAX she can take a half a capful daily to keep her bowels regular

## 2016-03-04 NOTE — Assessment & Plan Note (Signed)
Thyroid nodules benign she'll need a repeat ultrasound in one year

## 2016-03-04 NOTE — Patient Instructions (Addendum)
Go back to miralax, and you can do 1/2 of the dose  Continue current medications Try the full dose of trazodone  F/U 4 months

## 2016-03-04 NOTE — Assessment & Plan Note (Signed)
Blood pressure is well-controlled and change her medication. 

## 2016-03-04 NOTE — Assessment & Plan Note (Signed)
Review of medication she states that the 25 mg does not help very much advised her to take the entire 50 mg of the trazodone. She has been scared to try this and she thinks that she will be too sleepy into the daytime. Her daughter is going to stay with her when she tries the entire dose

## 2016-03-22 ENCOUNTER — Telehealth: Payer: Self-pay | Admitting: *Deleted

## 2016-03-22 NOTE — Telephone Encounter (Signed)
Received call from patient.   Reports that half dose of Miralax is not working.   MD made aware and advised to increase to whole dose.   Patient aware.

## 2016-04-14 ENCOUNTER — Other Ambulatory Visit: Payer: Medicare Other

## 2016-04-14 ENCOUNTER — Ambulatory Visit: Payer: Medicare Other | Admitting: Oncology

## 2016-05-03 ENCOUNTER — Other Ambulatory Visit: Payer: Self-pay | Admitting: Family Medicine

## 2016-05-18 DIAGNOSIS — N183 Chronic kidney disease, stage 3 (moderate): Secondary | ICD-10-CM | POA: Diagnosis not present

## 2016-05-18 DIAGNOSIS — R809 Proteinuria, unspecified: Secondary | ICD-10-CM | POA: Diagnosis not present

## 2016-05-18 DIAGNOSIS — E559 Vitamin D deficiency, unspecified: Secondary | ICD-10-CM | POA: Diagnosis not present

## 2016-05-18 DIAGNOSIS — D509 Iron deficiency anemia, unspecified: Secondary | ICD-10-CM | POA: Diagnosis not present

## 2016-05-18 DIAGNOSIS — Z79899 Other long term (current) drug therapy: Secondary | ICD-10-CM | POA: Diagnosis not present

## 2016-05-24 DIAGNOSIS — D649 Anemia, unspecified: Secondary | ICD-10-CM | POA: Diagnosis not present

## 2016-05-24 DIAGNOSIS — N250 Renal osteodystrophy: Secondary | ICD-10-CM | POA: Diagnosis not present

## 2016-05-24 DIAGNOSIS — N183 Chronic kidney disease, stage 3 (moderate): Secondary | ICD-10-CM | POA: Diagnosis not present

## 2016-06-01 DIAGNOSIS — L820 Inflamed seborrheic keratosis: Secondary | ICD-10-CM | POA: Diagnosis not present

## 2016-06-30 DIAGNOSIS — N183 Chronic kidney disease, stage 3 (moderate): Secondary | ICD-10-CM | POA: Diagnosis not present

## 2016-06-30 DIAGNOSIS — Z79899 Other long term (current) drug therapy: Secondary | ICD-10-CM | POA: Diagnosis not present

## 2016-06-30 DIAGNOSIS — E559 Vitamin D deficiency, unspecified: Secondary | ICD-10-CM | POA: Diagnosis not present

## 2016-06-30 DIAGNOSIS — I10 Essential (primary) hypertension: Secondary | ICD-10-CM | POA: Diagnosis not present

## 2016-06-30 DIAGNOSIS — D509 Iron deficiency anemia, unspecified: Secondary | ICD-10-CM | POA: Diagnosis not present

## 2016-07-01 DIAGNOSIS — I639 Cerebral infarction, unspecified: Secondary | ICD-10-CM | POA: Diagnosis not present

## 2016-07-01 DIAGNOSIS — H81399 Other peripheral vertigo, unspecified ear: Secondary | ICD-10-CM | POA: Diagnosis not present

## 2016-07-01 DIAGNOSIS — I10 Essential (primary) hypertension: Secondary | ICD-10-CM | POA: Diagnosis not present

## 2016-07-01 DIAGNOSIS — M179 Osteoarthritis of knee, unspecified: Secondary | ICD-10-CM | POA: Diagnosis not present

## 2016-07-04 ENCOUNTER — Other Ambulatory Visit: Payer: Self-pay | Admitting: Family Medicine

## 2016-07-05 DIAGNOSIS — I10 Essential (primary) hypertension: Secondary | ICD-10-CM | POA: Diagnosis not present

## 2016-07-05 DIAGNOSIS — E870 Hyperosmolality and hypernatremia: Secondary | ICD-10-CM | POA: Diagnosis not present

## 2016-07-05 DIAGNOSIS — D649 Anemia, unspecified: Secondary | ICD-10-CM | POA: Diagnosis not present

## 2016-07-05 DIAGNOSIS — N183 Chronic kidney disease, stage 3 (moderate): Secondary | ICD-10-CM | POA: Diagnosis not present

## 2016-07-08 ENCOUNTER — Telehealth: Payer: Self-pay | Admitting: *Deleted

## 2016-07-08 ENCOUNTER — Ambulatory Visit (INDEPENDENT_AMBULATORY_CARE_PROVIDER_SITE_OTHER): Payer: Medicare Other | Admitting: Family Medicine

## 2016-07-08 ENCOUNTER — Encounter: Payer: Self-pay | Admitting: Family Medicine

## 2016-07-08 VITALS — BP 138/72 | HR 76 | Temp 98.9°F | Resp 16 | Ht 63.0 in | Wt 149.0 lb

## 2016-07-08 DIAGNOSIS — K5901 Slow transit constipation: Secondary | ICD-10-CM | POA: Diagnosis not present

## 2016-07-08 DIAGNOSIS — E039 Hypothyroidism, unspecified: Secondary | ICD-10-CM | POA: Diagnosis not present

## 2016-07-08 DIAGNOSIS — E7800 Pure hypercholesterolemia, unspecified: Secondary | ICD-10-CM | POA: Diagnosis not present

## 2016-07-08 DIAGNOSIS — N183 Chronic kidney disease, stage 3 unspecified: Secondary | ICD-10-CM

## 2016-07-08 DIAGNOSIS — I10 Essential (primary) hypertension: Secondary | ICD-10-CM | POA: Diagnosis not present

## 2016-07-08 DIAGNOSIS — F5101 Primary insomnia: Secondary | ICD-10-CM | POA: Diagnosis not present

## 2016-07-08 LAB — CBC WITH DIFFERENTIAL/PLATELET
Basophils Absolute: 50 cells/uL (ref 0–200)
Basophils Relative: 1 %
Eosinophils Absolute: 150 cells/uL (ref 15–500)
Eosinophils Relative: 3 %
HEMATOCRIT: 40.4 % (ref 35.0–45.0)
Hemoglobin: 13.2 g/dL (ref 12.0–15.0)
LYMPHS PCT: 39 %
Lymphs Abs: 1950 cells/uL (ref 850–3900)
MCH: 31.5 pg (ref 27.0–33.0)
MCHC: 32.7 g/dL (ref 32.0–36.0)
MCV: 96.4 fL (ref 80.0–100.0)
MONO ABS: 350 {cells}/uL (ref 200–950)
MPV: 8.8 fL (ref 7.5–12.5)
Monocytes Relative: 7 %
NEUTROS PCT: 50 %
Neutro Abs: 2500 cells/uL (ref 1500–7800)
Platelets: 318 10*3/uL (ref 140–400)
RBC: 4.19 MIL/uL (ref 3.80–5.10)
RDW: 15.1 % — AB (ref 11.0–15.0)
WBC: 5 10*3/uL (ref 3.8–10.8)

## 2016-07-08 LAB — LIPID PANEL
Cholesterol: 131 mg/dL (ref <200)
HDL: 61 mg/dL (ref >50)
LDL CALC: 59 mg/dL (ref <100)
TRIGLYCERIDES: 57 mg/dL (ref <150)
Total CHOL/HDL Ratio: 2.1 Ratio (ref <5.0)
VLDL: 11 mg/dL (ref <30)

## 2016-07-08 MED ORDER — ATORVASTATIN CALCIUM 40 MG PO TABS: 80.0000 mg | ORAL_TABLET | Freq: Every day | ORAL | 3 refills | Status: DC

## 2016-07-08 MED ORDER — LINACLOTIDE 72 MCG PO CAPS
72.0000 ug | ORAL_CAPSULE | Freq: Every day | ORAL | 0 refills | Status: DC
Start: 2016-07-08 — End: 2016-11-08

## 2016-07-08 NOTE — Patient Instructions (Addendum)
Take 1 full tablet of the sleeping at night  We will call about lipitor  We will call with lab results Try the linzess once a day, stop the miralax F/U 4 months for PHYSICAL

## 2016-07-08 NOTE — Assessment & Plan Note (Signed)
Controlled, obtain notes from nephrology

## 2016-07-08 NOTE — Telephone Encounter (Signed)
Prescription sent to pharmacy.

## 2016-07-08 NOTE — Progress Notes (Signed)
   Subjective:    Patient ID: [name], female    DOB: Jul 12, 1934, 82 y.o.   MRN: [id number]  Patient presents for 4 month F/U (is fasting) Issue here to follow-up chronic medical problems. Hypertension she is taking her blood pressure medicine as prescribed. Does not have any side effects is been controlled. She also has hyperlipidemia she is on Lipitor 80 mg once a day without any side effects.  Chronic insomnia she takes trazodone as needed when , only takes a few times a week but it does help her sleep better when she takes it   Hypothyroidism currently on thyroid replacement 25 g due for repeat thyroid function studies   CKD- seen by nephrology, losartan discontinued, had BMET, sodium levels were improved advised to keep hydrated   Constipation- miralax works some but still gets very constipated     Review Of Systems:  GEN- denies fatigue, fever, weight loss,weakness, recent illness HEENT- denies eye drainage, change in vision, nasal discharge, CVS- denies chest pain, palpitations RESP- denies SOB, cough, wheeze ABD- denies N/V, change in stools, abd pain GU- denies dysuria, hematuria, dribbling, incontinence MSK- denies joint pain, muscle aches, injury Neuro- denies headache, dizziness, syncope, seizure activity       Objective:    BP 138/72   Pulse 76   Temp 98.9 F (37.2 C) (Oral)   Resp 16   Ht 5\' 3"  (1.6 m)   Wt 149 lb (67.6 kg)   SpO2 98%   BMI 26.39 kg/m  GEN- NAD, alert and oriented x3 HEENT- PERRL, EOMI, non injected sclera, pink conjunctiva, MMM, oropharynx clear Neck- Supple, no thyromegaly CVS- RRR, no murmur RESP-CTAB ABD-NABS,soft,NT,ND EXT- No edema Pulses- Radial, - 2+        Assessment & Plan:      Problem List Items Addressed This Visit    Insomnia    Continue trazodone take each night      Hypothyroidism    Recheck TFT      Relevant Orders   TSH   T3, free   T4, free   Essential hypertension - Primary   Controlled, obtain notes from nephrology      Relevant Medications   amLODipine (NORVASC) 10 MG tablet   Other Relevant Orders   CBC with Differential/Platelet   Lipid panel   Constipation    Trial of linzess 72mg  once a day , samples given       CKD (chronic kidney disease), stage III    Other Visit Diagnoses    Pure hypercholesterolemia       Relevant Medications   amLODipine (NORVASC) 10 MG tablet   Other Relevant Orders   Lipid panel      Note: This dictation was prepared with Dragon dictation along with smaller phrase technology. Any transcriptional errors that result from this process are unintentional.

## 2016-07-08 NOTE — Assessment & Plan Note (Addendum)
Trial of linzess 72mg  once a day , samples given

## 2016-07-08 NOTE — Assessment & Plan Note (Signed)
Continue trazodone take each night

## 2016-07-08 NOTE — Addendum Note (Signed)
Addended by: Vic Blackbird F on: 07/08/2016 11:01 AM   Modules accepted: Orders

## 2016-07-08 NOTE — Assessment & Plan Note (Signed)
Recheck TFT 

## 2016-07-09 LAB — T4, FREE: Free T4: 1.3 ng/dL (ref 0.8–1.8)

## 2016-07-09 LAB — T3, FREE: T3, Free: 2.7 pg/mL (ref 2.3–4.2)

## 2016-07-09 LAB — TSH: TSH: 0.56 m[IU]/L

## 2016-07-14 ENCOUNTER — Encounter: Payer: Self-pay | Admitting: *Deleted

## 2016-08-02 ENCOUNTER — Other Ambulatory Visit: Payer: Self-pay | Admitting: Family Medicine

## 2016-08-09 ENCOUNTER — Other Ambulatory Visit: Payer: Self-pay | Admitting: Family Medicine

## 2016-08-16 DIAGNOSIS — H25813 Combined forms of age-related cataract, bilateral: Secondary | ICD-10-CM | POA: Diagnosis not present

## 2016-08-16 DIAGNOSIS — H5203 Hypermetropia, bilateral: Secondary | ICD-10-CM | POA: Diagnosis not present

## 2016-08-16 DIAGNOSIS — H524 Presbyopia: Secondary | ICD-10-CM | POA: Diagnosis not present

## 2016-08-16 DIAGNOSIS — H52222 Regular astigmatism, left eye: Secondary | ICD-10-CM | POA: Diagnosis not present

## 2016-08-31 ENCOUNTER — Other Ambulatory Visit: Payer: Self-pay | Admitting: Family Medicine

## 2016-10-31 ENCOUNTER — Other Ambulatory Visit: Payer: Self-pay | Admitting: Family Medicine

## 2016-10-31 DIAGNOSIS — Z79899 Other long term (current) drug therapy: Secondary | ICD-10-CM | POA: Diagnosis not present

## 2016-10-31 DIAGNOSIS — R809 Proteinuria, unspecified: Secondary | ICD-10-CM | POA: Diagnosis not present

## 2016-10-31 DIAGNOSIS — D509 Iron deficiency anemia, unspecified: Secondary | ICD-10-CM | POA: Diagnosis not present

## 2016-10-31 DIAGNOSIS — I10 Essential (primary) hypertension: Secondary | ICD-10-CM | POA: Diagnosis not present

## 2016-10-31 DIAGNOSIS — N183 Chronic kidney disease, stage 3 (moderate): Secondary | ICD-10-CM | POA: Diagnosis not present

## 2016-11-03 ENCOUNTER — Other Ambulatory Visit: Payer: Self-pay | Admitting: Family Medicine

## 2016-11-03 DIAGNOSIS — Z0000 Encounter for general adult medical examination without abnormal findings: Secondary | ICD-10-CM

## 2016-11-03 DIAGNOSIS — N183 Chronic kidney disease, stage 3 unspecified: Secondary | ICD-10-CM

## 2016-11-03 DIAGNOSIS — E039 Hypothyroidism, unspecified: Secondary | ICD-10-CM

## 2016-11-03 DIAGNOSIS — Z79899 Other long term (current) drug therapy: Secondary | ICD-10-CM

## 2016-11-03 DIAGNOSIS — E785 Hyperlipidemia, unspecified: Secondary | ICD-10-CM

## 2016-11-03 DIAGNOSIS — I10 Essential (primary) hypertension: Secondary | ICD-10-CM

## 2016-11-04 ENCOUNTER — Other Ambulatory Visit: Payer: Medicare Other

## 2016-11-04 DIAGNOSIS — I10 Essential (primary) hypertension: Secondary | ICD-10-CM

## 2016-11-04 DIAGNOSIS — N183 Chronic kidney disease, stage 3 unspecified: Secondary | ICD-10-CM

## 2016-11-04 DIAGNOSIS — E785 Hyperlipidemia, unspecified: Secondary | ICD-10-CM

## 2016-11-04 DIAGNOSIS — Z0000 Encounter for general adult medical examination without abnormal findings: Secondary | ICD-10-CM | POA: Diagnosis not present

## 2016-11-04 DIAGNOSIS — Z79899 Other long term (current) drug therapy: Secondary | ICD-10-CM | POA: Diagnosis not present

## 2016-11-04 DIAGNOSIS — E039 Hypothyroidism, unspecified: Secondary | ICD-10-CM

## 2016-11-04 LAB — CBC WITH DIFFERENTIAL/PLATELET
BASOS ABS: 49 {cells}/uL (ref 0–200)
Basophils Relative: 1 %
EOS ABS: 245 {cells}/uL (ref 15–500)
Eosinophils Relative: 5 %
HCT: 41.3 % (ref 35.0–45.0)
Hemoglobin: 13.6 g/dL (ref 12.0–15.0)
LYMPHS PCT: 40 %
Lymphs Abs: 1960 cells/uL (ref 850–3900)
MCH: 32 pg (ref 27.0–33.0)
MCHC: 32.9 g/dL (ref 32.0–36.0)
MCV: 97.2 fL (ref 80.0–100.0)
MONOS PCT: 6 %
MPV: 8.6 fL (ref 7.5–12.5)
Monocytes Absolute: 294 cells/uL (ref 200–950)
NEUTROS PCT: 48 %
Neutro Abs: 2352 cells/uL (ref 1500–7800)
PLATELETS: 332 10*3/uL (ref 140–400)
RBC: 4.25 MIL/uL (ref 3.80–5.10)
RDW: 14.3 % (ref 11.0–15.0)
WBC: 4.9 10*3/uL (ref 3.8–10.8)

## 2016-11-05 LAB — COMPLETE METABOLIC PANEL WITH GFR
ALBUMIN: 4.3 g/dL (ref 3.6–5.1)
ALK PHOS: 107 U/L (ref 33–130)
ALT: 19 U/L (ref 6–29)
AST: 18 U/L (ref 10–35)
BILIRUBIN TOTAL: 0.8 mg/dL (ref 0.2–1.2)
BUN: 22 mg/dL (ref 7–25)
CO2: 24 mmol/L (ref 20–32)
Calcium: 9.8 mg/dL (ref 8.6–10.4)
Chloride: 106 mmol/L (ref 98–110)
Creat: 1.38 mg/dL — ABNORMAL HIGH (ref 0.60–0.93)
GFR, Est African American: 42 mL/min — ABNORMAL LOW (ref >=60)
GFR, Est Non African American: 36 mL/min — ABNORMAL LOW (ref >=60)
Glucose, Bld: 94 mg/dL (ref 70–99)
Potassium: 4.3 mmol/L (ref 3.5–5.3)
SODIUM: 143 mmol/L (ref 135–146)
TOTAL PROTEIN: 7.2 g/dL (ref 6.1–8.1)

## 2016-11-05 LAB — TSH: TSH: 0.5 mIU/L

## 2016-11-05 LAB — LIPID PANEL
Cholesterol: 142 mg/dL (ref <200)
HDL: 65 mg/dL (ref >50)
LDL CALC: 67 mg/dL (ref <100)
Total CHOL/HDL Ratio: 2.2 Ratio (ref <5.0)
Triglycerides: 51 mg/dL (ref <150)
VLDL: 10 mg/dL (ref <30)

## 2016-11-07 ENCOUNTER — Other Ambulatory Visit: Payer: Self-pay | Admitting: Family Medicine

## 2016-11-08 ENCOUNTER — Ambulatory Visit (INDEPENDENT_AMBULATORY_CARE_PROVIDER_SITE_OTHER): Payer: Medicare Other | Admitting: Family Medicine

## 2016-11-08 ENCOUNTER — Encounter: Payer: Self-pay | Admitting: Family Medicine

## 2016-11-08 VITALS — BP 140/72 | HR 82 | Temp 98.7°F | Resp 14 | Ht 63.0 in | Wt 153.0 lb

## 2016-11-08 DIAGNOSIS — N183 Chronic kidney disease, stage 3 unspecified: Secondary | ICD-10-CM

## 2016-11-08 DIAGNOSIS — E039 Hypothyroidism, unspecified: Secondary | ICD-10-CM | POA: Diagnosis not present

## 2016-11-08 DIAGNOSIS — Z0000 Encounter for general adult medical examination without abnormal findings: Secondary | ICD-10-CM

## 2016-11-08 DIAGNOSIS — D649 Anemia, unspecified: Secondary | ICD-10-CM | POA: Diagnosis not present

## 2016-11-08 DIAGNOSIS — I10 Essential (primary) hypertension: Secondary | ICD-10-CM

## 2016-11-08 DIAGNOSIS — M1711 Unilateral primary osteoarthritis, right knee: Secondary | ICD-10-CM | POA: Diagnosis not present

## 2016-11-08 DIAGNOSIS — K5901 Slow transit constipation: Secondary | ICD-10-CM

## 2016-11-08 DIAGNOSIS — M25512 Pain in left shoulder: Secondary | ICD-10-CM | POA: Diagnosis not present

## 2016-11-08 DIAGNOSIS — E870 Hyperosmolality and hypernatremia: Secondary | ICD-10-CM | POA: Diagnosis not present

## 2016-11-08 NOTE — Patient Instructions (Addendum)
Referral to orthopedics  Call me with me the cream  Continue all other medications  TDAP to pharmacy  Mammogram in November  FLu shot at the hospital  Elevate your feet F/U 6 months

## 2016-11-08 NOTE — Progress Notes (Signed)
Subjective:   Patient presents for Medicare Annual/Subsequent preventive examination.   Left shoulder pain for past few months, pain goes into her neck, denies any tingling numbness into her hand.No specific injury to her neck no recent falls.  She also complains of right knee pain which has been chronic she's been using a topical two-step cream she was told that she has arthritis is that she had x-rays many years ago. She's been getting swelling in the knee does give out on her.  She's noticed swelling in her ankles the past couple days she was up on her feet a lot last week. She denies any shortness of breath or chest pain associated with the swelling. Of note she was seen by nephrology for her chronic kidney disease she did have mildly elevated sodium at 146 and her creatinine was back up to 1.46 their plan to do a 24-hour urine collection at the next visit.  Hypothyroidism she is taking her thyroid medication as prescribed next  Constipation the lens as worse but she only takes a few times a week but not a causes loose stools. She also wanted clarification with etomidate actually take the medication.      Review Past Medical/Family/Social: Per EMR    Risk Factors  Current exercise habits: walks Dietary issues discussed: Yes  Cardiac risk factors: CKD, HTN   Depression Screen  (Note: if answer to either of the following is "Yes", a more complete depression screening is indicated)  Over the past two weeks, have you felt down, depressed or hopeless? No Over the past two weeks, have you felt little interest or pleasure in doing things? No Have you lost interest or pleasure in daily life? No Do you often feel hopeless? No Do you cry easily over simple problems? No   Activities of Daily Living  In your present state of health, do you have any difficulty performing the following activities?:  Driving? No  Managing money? No  Feeding yourself? No  Getting from bed to chair? No   Climbing a flight of stairs? No  Preparing food and eating?: No  Bathing or showering? No  Getting dressed: No  Getting to the toilet? No  Using the toilet:No  Moving around from place to place: No  In the past year have you fallen or had a near fall?:No  Are you sexually active? No  Do you have more than one partner? No   Hearing Difficulties: No  Do you often ask people to speak up or repeat themselves? No  Do you experience ringing or noises in your ears? No Do you have difficulty understanding soft or whispered voices? No  Do you feel that you have a problem with memory? No Do you often misplace items? No  Do you feel safe at home? Yes  Cognitive Testing  Alert? Yes Normal Appearance?Yes  Oriented to person? Yes Place? Yes  Time? Yes  Recall of three objects? Yes  Can perform simple calculations? Yes  Displays appropriate judgment?Yes  Can read the correct time from a watch face?Yes   List the Names of Other Physician/Practitioners you currently use: Nephrology, Neurology   Screening Tests / Date TDAP- due Pneumonia UTD Mammogram - UTD  GEN- denies fatigue, fever, weight loss,weakness, recent illness HEENT- denies eye drainage, change in vision, nasal discharge, CVS- denies chest pain, palpitations RESP- denies SOB, cough, wheeze ABD- denies N/V, change in stools, abd pain GU- denies dysuria, hematuria, dribbling, incontinence MSK-+joint pain, muscle aches, injury Neuro- denies  headache, dizziness, syncope, seizure activity   Physical:  GEN- NAD, alert and oriented x3 HEENT- PERRL, EOMI, non injected sclera, pink conjunctiva, MMM, oropharynx clear Neck- Supple, fair ROM C spine, NT  CVS- RRR, no murmur RESP-CTAB ABD-NABS,soft,NT,ND MSK- FROM upper ext, mild popping with ROM left UE, rotator cuff in tactbiceps in tact,   Right knee- mild swelling inferior, TTP over swelling, fair ROM bilat, no erythema no warmth EXT - Trace pedal edema R >l Pulses- Radial,  DP- 2+   Assessment:    Annual wellness medicare exam   Plan:    During the course of the visit the patient was educated and counseled about appropriate screening and preventive services including:  Screening mammography - SHE WOULD like to continue yearly screenins   TDAP  Prescription given to that she can get the vaccine at the pharmacy or Medicare part D.  Screen neg  for depression.   Left shoulder pain I think this is actually pain from osteoporosis and her neck possibly her shoulder to but she has good range of motion with her shoulder. I have her set up with orthopedics and she also has osteoporosis of her right knee with a small effusion. Okay for  her to take his acetaminophen and she can continue with her topical anti-inflammatory cream which we will obtain the specific dosing for an refill for her.  Mild pedal edema think the right side is a little worse at her ankle because of her knee swelling. No signs of any overt heart failure. She did have fairly normal echo last year just mild diastolic dysfunction and not start her on any diuretics at this time I do not think it is necessary. She can just elevate her feet and she also has somesupport hose over-the-counter that she's been wearing  She does have advanced directives and living will completed they will get me a copy.  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  No we discussed her fasting labs as well as when to take her medications. Diet review for nutrition referral? Yes ____ Not Indicated __x__  Patient Instructions (the written plan) was given to the patient.  Medicare Attestation  I have personally reviewed:  The patient's medical and social history  Their use of alcohol, tobacco or illicit drugs  Their current medications and supplements  The patient's functional ability including ADLs,fall risks, home safety risks, cognitive, and hearing and visual impairment  Diet and physical activities   Evidence for depression or mood disorders  The patient's weight, height, BMI, and visual acuity have been recorded in the chart. I have made referrals, counseling, and provided education to the patient based on review of the above and I have provided the patient with a written personalized care plan for preventive services.

## 2016-11-09 ENCOUNTER — Other Ambulatory Visit: Payer: Self-pay | Admitting: *Deleted

## 2016-11-09 MED ORDER — TETANUS-DIPHTH-ACELL PERTUSSIS 5-2-15.5 LF-MCG/0.5 IM SUSP: 0.5000 mL | Freq: Once | INTRAMUSCULAR | 0 refills | Status: AC

## 2016-12-01 ENCOUNTER — Emergency Department (HOSPITAL_COMMUNITY)
Admission: EM | Admit: 2016-12-01 | Discharge: 2016-12-01 | Disposition: A | Discharge status: Home / Self Care | Payer: Medicare Other | Attending: Emergency Medicine | Admitting: Emergency Medicine

## 2016-12-01 ENCOUNTER — Encounter (HOSPITAL_COMMUNITY): Payer: Self-pay | Admitting: *Deleted

## 2016-12-01 ENCOUNTER — Emergency Department (HOSPITAL_COMMUNITY): Payer: Medicare Other

## 2016-12-01 DIAGNOSIS — Z85038 Personal history of other malignant neoplasm of large intestine: Secondary | ICD-10-CM | POA: Insufficient documentation

## 2016-12-01 DIAGNOSIS — R202 Paresthesia of skin: Secondary | ICD-10-CM

## 2016-12-01 DIAGNOSIS — Z8673 Personal history of transient ischemic attack (TIA), and cerebral infarction without residual deficits: Secondary | ICD-10-CM | POA: Diagnosis not present

## 2016-12-01 DIAGNOSIS — N183 Chronic kidney disease, stage 3 (moderate): Secondary | ICD-10-CM | POA: Insufficient documentation

## 2016-12-01 DIAGNOSIS — E039 Hypothyroidism, unspecified: Secondary | ICD-10-CM | POA: Insufficient documentation

## 2016-12-01 DIAGNOSIS — I129 Hypertensive chronic kidney disease with stage 1 through stage 4 chronic kidney disease, or unspecified chronic kidney disease: Secondary | ICD-10-CM | POA: Insufficient documentation

## 2016-12-01 DIAGNOSIS — R51 Headache: Secondary | ICD-10-CM | POA: Diagnosis not present

## 2016-12-01 LAB — BASIC METABOLIC PANEL
ANION GAP: 10 (ref 5–15)
BUN: 20 mg/dL (ref 6–20)
CALCIUM: 9.9 mg/dL (ref 8.9–10.3)
CHLORIDE: 106 mmol/L (ref 101–111)
CO2: 27 mmol/L (ref 22–32)
CREATININE: 1.28 mg/dL — AB (ref 0.44–1.00)
GFR calc non Af Amer: 38 mL/min — ABNORMAL LOW (ref >60)
GFR, EST AFRICAN AMERICAN: 45 mL/min — AB (ref >60)
Glucose, Bld: 95 mg/dL (ref 65–99)
Potassium: 4.1 mmol/L (ref 3.5–5.1)
SODIUM: 143 mmol/L (ref 135–145)

## 2016-12-01 LAB — CBC WITH DIFFERENTIAL/PLATELET
BASOS PCT: 1 %
Basophils Absolute: 0 10*3/uL (ref 0.0–0.1)
EOS ABS: 0.1 10*3/uL (ref 0.0–0.7)
Eosinophils Relative: 2 %
HCT: 42.5 % (ref 36.0–46.0)
HEMOGLOBIN: 14.3 g/dL (ref 12.0–15.0)
Lymphocytes Relative: 38 %
Lymphs Abs: 2.4 10*3/uL (ref 0.7–4.0)
MCH: 32.4 pg (ref 26.0–34.0)
MCHC: 33.6 g/dL (ref 30.0–36.0)
MCV: 96.4 fL (ref 78.0–100.0)
Monocytes Absolute: 0.3 10*3/uL (ref 0.1–1.0)
Monocytes Relative: 4 %
NEUTROS PCT: 55 %
Neutro Abs: 3.5 10*3/uL (ref 1.7–7.7)
Platelets: 339 10*3/uL (ref 150–400)
RBC: 4.41 MIL/uL (ref 3.87–5.11)
RDW: 13.9 % (ref 11.5–15.5)
WBC: 6.3 10*3/uL (ref 4.0–10.5)

## 2016-12-01 NOTE — Discharge Instructions (Signed)
You were seen in the ED today with numbness along the left side of the neck and shoulder. Your lab work and head CT were normal. Follow up with Dr. Merlene Laughter in the morning to schedule a follow up appointment. Return to the ED with any worsening numbness, weakness, vision changes, difficulty speaking, or difficulty swallowing.

## 2016-12-01 NOTE — ED Provider Notes (Signed)
Emergency Department Provider Note   I have reviewed the triage vital signs and the nursing notes.   HISTORY  Chief Complaint Numbness   HPI [name] is a 82 y.o. female with PMH of HTN, HLD, and prior CVA presents to the emergency room in for evaluation ofleft shoulder numbness over the last month with progression of numbness sensation to the left lateral neck over the past several days. Patient describes waxing and waning symptoms. She has some associated burning pain in her scalp at times. Denies any radiation of symptoms down the left arm or leg. No speech changes. No difficulty swallowing. No vision changes. Patient has followed with neurology, Dr. Merlene Laughter, in the past after an admission this time last year for similar but more extensive symptoms.    Past Medical History:  Diagnosis Date  . Allergy   . Arthritis   . Colon cancer ([place])    colon ca dx 07/30/09  . History of colon cancer 06/2009   found at time of TCS 06/29/09, 1.2cm sessile cecal polyp, no adjuvent therapy needed  . HTN (hypertension)   . Hyperlipidemia   . Hypothyroidism   . Stroke ([place])   . Vertigo     Patient Active Problem List   Diagnosis Date Noted  . Insomnia 12/15/2015  . Paresthesia of left upper and lower extremity 11/21/2015  . Left leg weakness 11/21/2015  . Paresthesias   . Essential hypertension 11/02/2015  . Hypothyroidism 11/02/2015  . Vertigo 11/02/2015  . CKD (chronic kidney disease), stage III 11/02/2015  . Loss of weight 11/02/2015  . OA (osteoarthritis) of knee 11/02/2015  . Colon cancer ([place]) 02/28/2011  . History of colon cancer 06/29/2010  . Constipation 06/10/2009    Past Surgical History:  Procedure Laterality Date  . ABDOMINAL HYSTERECTOMY    . COLON SURGERY  07/2009   right hemicolectomy, no residual colon cancer on path  . COLONOSCOPY  07/16/2010   RWE:[id number] POLYP-TCS 3 YEARS  . COLONOSCOPY N/A 08/02/2013   Procedure: COLONOSCOPY;  Surgeon:  Danie Binder, MD;  Location: [facility];  Service: Endoscopy;  Laterality: N/A;  9:30  . PARTIAL HYSTERECTOMY    . partial thyroidectomy     benign tumors    Current Outpatient Rx  . Order #: [id number] Class: Historical Med  . Order #: [id number] Class: Historical Med  . Order #: [id number] Class: OTC  . Order #: [id number] Class: Normal  . Order #: [id number] Class: Normal  . Order #: [id number] Class: Historical Med  . Order #: [id number] Class: Historical Med  . Order #: [id number] Class: Normal    Allergies Patient has no known allergies.  Family History  Problem Relation Age of Onset  . Colon cancer Mother        >age60  . Arthritis Mother   . Cancer Mother   . Heart disease Mother   . Hyperlipidemia Mother   . Hypertension Mother   . Heart attack Father   . Heart disease Father   . Diabetes Maternal Aunt   . Hyperlipidemia Daughter   . Hypertension Daughter   . Liver disease Neg Hx     Social History Social History  Substance Use Topics  . Smoking status: Never Smoker  . Smokeless tobacco: Never Used  . Alcohol use No    Review of Systems  Constitutional: No fever/chills Eyes: No visual changes. ENT: No sore throat. Cardiovascular: Denies chest pain. Respiratory: Denies shortness of breath. Gastrointestinal: No abdominal pain.  No nausea, no vomiting.  No  diarrhea.  No constipation. Genitourinary: Negative for dysuria. Musculoskeletal: Negative for back pain. Skin: Negative for rash. Neurological: Negative for headaches, focal weakness. Positive left shoulder and left lateral neck numbness.   10-point ROS otherwise negative.  ____________________________________________   PHYSICAL EXAM:  VITAL SIGNS: ED Triage Vitals  Enc Vitals Group     BP 12/01/16 1531 (!) 155/92     Pulse Rate 12/01/16 1531 87     Resp 12/01/16 1531 18     Temp 12/01/16 1531 98.7 F (37.1 C)     Temp Source 12/01/16 1531 Oral     SpO2 12/01/16 1531 98 %     Weight 12/01/16 1532  153 lb (69.4 kg)     Height 12/01/16 1532 5\' 3"  (1.6 m)     Pain Score 12/01/16 1531 0   Constitutional: Alert and oriented. Well appearing and in no acute distress. Eyes: Conjunctivae are normal.  Head: Atraumatic. Nose: No congestion/rhinnorhea. Mouth/Throat: Mucous membranes are moist.   Neck: No stridor.   Cardiovascular: Normal rate, regular rhythm. Good peripheral circulation. Grossly normal heart sounds.   Respiratory: Normal respiratory effort.  No retractions. Lungs CTAB. Gastrointestinal: Soft and nontender. No distention.  Musculoskeletal: No lower extremity tenderness nor edema. No gross deformities of extremities. Neurologic:  Normal speech and language. Decreased sensation to light touch over the left shoulder and left lateral neck. Normal sensation over face and scalp. No CN deficits 2-12. No pronator drift. Normal strength and sensation in the B/L upper extremities.  Skin:  Skin is warm, dry and intact. No rash noted.   ____________________________________________   LABS (all labs ordered are listed, but only abnormal results are displayed)  Labs Reviewed  BASIC METABOLIC PANEL - Abnormal; Notable for the following:       Result Value   Creatinine, Ser 1.28 (*)    GFR calc non Af Amer 38 (*)    GFR calc Af Amer 45 (*)    All other components within normal limits  CBC WITH DIFFERENTIAL/PLATELET   ____________________________________________  RADIOLOGY  Ct Head Wo Contrast  Result Date: 12/01/2016 CLINICAL DATA:  Pain in the left side of neck and shoulder and face history of vertigo EXAM: CT HEAD WITHOUT CONTRAST TECHNIQUE: Contiguous axial images were obtained from the base of the skull through the vertex without intravenous contrast. COMPARISON:  MRI a 11/22/2015, CT brain 11/21/2015 FINDINGS: Brain: No acute territorial infarction, hemorrhage, or intracranial mass is seen. Moderate periventricular and deep white matter small vessel ischemic changes as before.  Moderate atrophy. Stable ventricle size. Vascular: No hyperdense vessels.  Carotid artery calcifications. Skull: No fracture or suspicious bone lesion Sinuses/Orbits: Minimal mucosal thickening in the ethmoid sinuses. No acute orbital abnormality. Other: None IMPRESSION: 1. No CT evidence for acute intracranial abnormality. 2. Atrophy and small vessel ischemic changes of the white matter. Electronically Signed   By: Donavan Foil M.D.   On: 12/01/2016 19:21    ____________________________________________   PROCEDURES  Procedure(s) performed:   Procedures  None ____________________________________________   INITIAL IMPRESSION / ASSESSMENT AND PLAN / ED COURSE  Pertinent labs & imaging results that were available during my care of the patient were reviewed by me and considered in my medical decision making (see chart for details).  Patient presents to the emergency room in for evaluation of numbness to the left shoulder for the last month with intermittent numbness to the left lateral neck over the past several days. She does have decreased sensation to the left lateral  neck but normal sensation to the face and scalp. No cranial nerve deficits appreciated on my exam. No weakness or numbness in the upper or lower extremities. She is ambulatory with steady gait. Very low suspicion for acute stroke. Patient has followed with neurology for similar symptoms in the past. MRI last year showed no acute infarct. Plan for labs and CT head with re-evaluation.   CT and labs reviewed and unremarkable. Very low suspicion for CVA in the case. Symptoms are on and off and have been ongoing for the last several weeks. Plan to follow with Dr. Merlene Laughter as outpatient.   At this time, I do not feel there is any life-threatening condition present. I have reviewed and discussed all results (EKG, imaging, lab, urine as appropriate), exam findings with patient. I have reviewed nursing notes and appropriate previous  records.  I feel the patient is safe to be discharged home without further emergent workup. Discussed usual and customary return precautions. Patient and family (if present) verbalize understanding and are comfortable with this plan.  Patient will follow-up with their primary care provider. If they do not have a primary care provider, information for follow-up has been provided to them. All questions have been answered.  ____________________________________________  FINAL CLINICAL IMPRESSION(S) / ED DIAGNOSES  Final diagnoses:  Paresthesias     MEDICATIONS GIVEN DURING THIS VISIT:  Medications - No data to display   NEW OUTPATIENT MEDICATIONS STARTED DURING THIS VISIT:  None   Note:  This document was prepared using Dragon voice recognition software and may include unintentional dictation errors.  Nanda Quinton, MD Emergency Medicine   Jarom Govan, [facility], MD 12/02/16 [phone]

## 2016-12-01 NOTE — ED Triage Notes (Signed)
Pt comes was seen at her pcp's office a month ago for left arm pain and numbness, she was told this was arthritis. Starting in the last 3 days pt began having numbness and tingling in her left neck and left scalp. Pt denies any unilateral weakness or speech problems. Denies any chest pain. Pt is pain free at this time. She is alert and oriented.

## 2016-12-02 ENCOUNTER — Other Ambulatory Visit: Payer: Self-pay | Admitting: Family Medicine

## 2016-12-12 DIAGNOSIS — I6930 Unspecified sequelae of cerebral infarction: Secondary | ICD-10-CM | POA: Diagnosis not present

## 2016-12-12 DIAGNOSIS — I10 Essential (primary) hypertension: Secondary | ICD-10-CM | POA: Diagnosis not present

## 2016-12-12 DIAGNOSIS — M5412 Radiculopathy, cervical region: Secondary | ICD-10-CM | POA: Diagnosis not present

## 2016-12-12 DIAGNOSIS — M25512 Pain in left shoulder: Secondary | ICD-10-CM | POA: Diagnosis not present

## 2016-12-14 ENCOUNTER — Ambulatory Visit (INDEPENDENT_AMBULATORY_CARE_PROVIDER_SITE_OTHER): Payer: Medicare Other | Admitting: Orthopaedic Surgery

## 2016-12-14 ENCOUNTER — Encounter: Payer: Self-pay | Admitting: Orthopaedic Surgery

## 2016-12-14 ENCOUNTER — Ambulatory Visit (INDEPENDENT_AMBULATORY_CARE_PROVIDER_SITE_OTHER): Payer: Medicare Other

## 2016-12-14 VITALS — BP 136/79 | HR 84 | Temp 99.1°F | Ht 63.0 in | Wt 152.0 lb

## 2016-12-14 DIAGNOSIS — M25561 Pain in right knee: Secondary | ICD-10-CM

## 2016-12-14 DIAGNOSIS — G8929 Other chronic pain: Secondary | ICD-10-CM

## 2016-12-14 NOTE — Progress Notes (Signed)
Subjective:    Patient ID: [name], female    DOB: [birth year], 82 y.o.   MRN: [id number]  HPI She has right knee pain for some time.  She has seen Dr. Buelah Manis and has used a two-step cream which helps.  But, her pain is worse now, she has more swelling and some giving way.  She has no trauma, no redness.  She is using a cane.  She has pain every day.  Ice and heat do not help much.   Review of Systems  HENT: Negative for congestion.   Respiratory: Negative for cough and shortness of breath.   Cardiovascular: Negative for chest pain and leg swelling.  Endocrine: Negative for cold intolerance.  Musculoskeletal: Positive for arthralgias, gait problem and joint swelling.  Allergic/Immunologic: Positive for environmental allergies.   Past Medical History:  Diagnosis Date  . Allergy   . Arthritis   . Colon cancer ([place])    colon ca dx 07/30/09  . History of colon cancer 06/2009   found at time of TCS 06/29/09, 1.2cm sessile cecal polyp, no adjuvent therapy needed  . HTN (hypertension)   . Hyperlipidemia   . Hypothyroidism   . Stroke ([place])   . Vertigo     Past Surgical History:  Procedure Laterality Date  . ABDOMINAL HYSTERECTOMY    . COLON SURGERY  07/2009   right hemicolectomy, no residual colon cancer on path  . COLONOSCOPY  07/16/2010   [id number] POLYP-TCS 3 YEARS  . COLONOSCOPY N/A 08/02/2013   Procedure: COLONOSCOPY;  Surgeon: Danie Binder, MD;  Location: [facility];  Service: Endoscopy;  Laterality: N/A;  9:30  . PARTIAL HYSTERECTOMY    . partial thyroidectomy     benign tumors    Current Outpatient Prescriptions on File Prior to Visit  Medication Sig Dispense Refill  . acetaminophen (TYLENOL) 500 MG tablet Take 1,000 mg by mouth every 8 (eight) hours as needed (cold).    Marland Kitchen amLODipine (NORVASC) 10 MG tablet Take 10 mg by mouth daily.    Marland Kitchen aspirin EC 81 MG tablet Take 2 tablets (162 mg total) by mouth daily.    Marland Kitchen atorvastatin (LIPITOR) 80 MG  tablet TAKE (1) TABLET BY MOUTH ONCE A DAY. 90 tablet 0  . levothyroxine (SYNTHROID, LEVOTHROID) 25 MCG tablet TAKE 1 TABLET DAILY BEFORE BREAKFAST. 30 tablet 0  . linaclotide (LINZESS) 72 MCG capsule Take 72 mcg by mouth daily before breakfast.    . RESTASIS 0.05 % ophthalmic emulsion Place 1 drop into both eyes 2 (two) times daily.     . traZODone (DESYREL) 50 MG tablet Take 0.5-1 tablets (25-50 mg total) by mouth at bedtime as needed for sleep. 30 tablet 3   No current facility-administered medications on file prior to visit.     Social History   Social History  . Marital status: Widowed    Spouse name: N/A  . Number of children: 2  . Years of education: N/A   Occupational History  . 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 retired from [employer]    Social History Main Topics  . Smoking status: Never Smoker  . Smokeless tobacco: Never Used  . Alcohol use No  . Drug use: No  . Sexual activity: Not Currently   Other Topics Concern  . Not on file   Social History Narrative  . No narrative on file    Family History  Problem Relation Age of Onset  . Colon cancer Mother        >  age60  . Arthritis Mother   . Cancer Mother   . Heart disease Mother   . Hyperlipidemia Mother   . Hypertension Mother   . Heart attack Father   . Heart disease Father   . Diabetes Maternal Aunt   . Hyperlipidemia Daughter   . Hypertension Daughter   . Liver disease Neg Hx     BP 136/79   Pulse 84   Temp 99.1 F (37.3 C)   Ht 5\' 3"  (1.6 m)   Wt 152 lb (68.9 kg)   BMI 26.93 kg/m       Objective:   Physical Exam  Constitutional: She is oriented to person, place, and time. She appears well-developed and well-nourished.  HENT:  Head: Normocephalic and atraumatic.  Eyes: Pupils are equal, round, and reactive to light. Conjunctivae and EOM are normal.  Neck: Normal range of motion. Neck supple.  Cardiovascular: Normal rate, regular rhythm and intact distal pulses.   Pulmonary/Chest: Effort  normal.  Abdominal: Soft.  Musculoskeletal: She exhibits tenderness (Right knee pain, crepitus, ROM 0 to 100, limp right, uses cane, mild effusion, stable knee, left knee negative.).  Neurological: She is alert and oriented to person, place, and time. She displays normal reflexes. No cranial nerve deficit. She exhibits normal muscle tone. Coordination normal.  Skin: Skin is warm and dry.  Psychiatric: She has a normal mood and affect. Her behavior is normal. Judgment and thought content normal.   X-rays were done of the right knee, reported separately.       Assessment & Plan:   Encounter Diagnosis  Name Primary?  . Chronic pain of right knee Yes   PROCEDURE NOTE:  The patient requests injections of the right knee , verbal consent was obtained.  The right knee was prepped appropriately after time out was performed.   Sterile technique was observed and injection of 1 cc of Depo-Medrol 40 mg with several cc's of plain xylocaine. Anesthesia was provided by ethyl chloride and a 20-gauge needle was used to inject the knee area. The injection was tolerated well.  A band aid dressing was applied.  The patient was advised to apply ice later today and tomorrow to the injection sight as needed.  Call if any problem.  Precautions discussed.   Electronically Signed Sanjuana Kava, MD 10/3/20182:18 PM

## 2016-12-22 DIAGNOSIS — R569 Unspecified convulsions: Secondary | ICD-10-CM | POA: Diagnosis not present

## 2016-12-31 ENCOUNTER — Other Ambulatory Visit: Payer: Self-pay | Admitting: Family Medicine

## 2017-01-03 ENCOUNTER — Encounter: Payer: Self-pay | Admitting: Orthopaedic Surgery

## 2017-01-03 ENCOUNTER — Ambulatory Visit (INDEPENDENT_AMBULATORY_CARE_PROVIDER_SITE_OTHER): Payer: Medicare Other | Admitting: Orthopaedic Surgery

## 2017-01-03 VITALS — BP 121/76 | HR 75 | Ht 63.0 in | Wt 152.0 lb

## 2017-01-03 DIAGNOSIS — M25561 Pain in right knee: Secondary | ICD-10-CM | POA: Diagnosis not present

## 2017-01-03 DIAGNOSIS — G8929 Other chronic pain: Secondary | ICD-10-CM | POA: Diagnosis not present

## 2017-01-03 NOTE — Progress Notes (Signed)
Patient [name], female DOB:03-30-1934, 82 y.o. YFV:[id number]  Chief Complaint  Patient presents with  . Follow-up    3 week recheck on right knee    HPI  [name] is a 82 y.o. female who has pain of the right knee.  She is much improved after the injection into the knee last time.  She is walking well.  She has little pain.  She has a cane but is hardly using it.  She has no new trauma, no redness. HPI  Body mass index is 26.93 kg/m.  ROS  Review of Systems  HENT: Negative for congestion.   Respiratory: Negative for cough and shortness of breath.   Cardiovascular: Negative for chest pain and leg swelling.  Endocrine: Negative for cold intolerance.  Musculoskeletal: Positive for arthralgias, gait problem and joint swelling.  Allergic/Immunologic: Positive for environmental allergies.    Past Medical History:  Diagnosis Date  . Allergy   . Arthritis   . Colon cancer ([place])    colon ca dx 07/30/09  . History of colon cancer 06/2009   found at time of TCS 06/29/09, 1.2cm sessile cecal polyp, no adjuvent therapy needed  . HTN (hypertension)   . Hyperlipidemia   . Hypothyroidism   . Stroke ([place])   . Vertigo     Past Surgical History:  Procedure Laterality Date  . ABDOMINAL HYSTERECTOMY    . COLON SURGERY  07/2009   right hemicolectomy, no residual colon cancer on path  . COLONOSCOPY  07/16/2010   [id number] POLYP-TCS 3 YEARS  . COLONOSCOPY N/A 08/02/2013   Procedure: COLONOSCOPY;  Surgeon: Danie Binder, MD;  Location[facility];  Service: Endoscopy;  Laterality: N/A;  9:30  . PARTIAL HYSTERECTOMY    . partial thyroidectomy     benign tumors    Family History  Problem Relation Age of Onset  . Colon cancer Mother        >age60  . Arthritis Mother   . Cancer Mother   . Heart disease Mother   . Hyperlipidemia Mother   . Hypertension Mother   . Heart attack Father   . Heart disease Father   . Diabetes Maternal Aunt   .  Hyperlipidemia Daughter   . Hypertension Daughter   . Liver disease Neg Hx     Social History Social History  Substance Use Topics  . Smoking status: Never Smoker  . Smokeless tobacco: Never Used  . Alcohol use No    No Known Allergies  Current Outpatient Prescriptions  Medication Sig Dispense Refill  . acetaminophen (TYLENOL) 500 MG tablet Take 1,000 mg by mouth every 8 (eight) hours as needed (cold).    Marland Kitchen amLODipine (NORVASC) 10 MG tablet Take 10 mg by mouth daily.    Marland Kitchen aspirin EC 81 MG tablet Take 2 tablets (162 mg total) by mouth daily.    Marland Kitchen atorvastatin (LIPITOR) 80 MG tablet TAKE (1) TABLET BY MOUTH ONCE A DAY. 90 tablet 0  . levothyroxine (SYNTHROID, LEVOTHROID) 25 MCG tablet TAKE 1 TABLET DAILY BEFORE BREAKFAST. 30 tablet 0  . linaclotide (LINZESS) 72 MCG capsule Take 72 mcg by mouth daily before breakfast.    . RESTASIS 0.05 % ophthalmic emulsion Place 1 drop into both eyes 2 (two) times daily.     . traZODone (DESYREL) 50 MG tablet Take 0.5-1 tablets (25-50 mg total) by mouth at bedtime as needed for sleep. 30 tablet 3   No current facility-administered medications for this visit.  Physical Exam  Blood pressure 121/76, pulse 75, height 5\' 3"  (1.6 m), weight 152 lb (68.9 kg).  Constitutional: overall normal hygiene, normal nutrition, well developed, normal grooming, normal body habitus. Assistive device:cane  Musculoskeletal: gait and station Limp none, muscle tone and strength are normal, no tremors or atrophy is present.  .  Neurological: coordination overall normal.  Deep tendon reflex/nerve stretch intact.  Sensation normal.  Cranial nerves II-XII intact.   Skin:   Normal overall no scars, lesions, ulcers or rashes. No psoriasis.  Psychiatric: Alert and oriented x 3.  Recent memory intact, remote memory unclear.  Normal mood and affect. Well groomed.  Good eye contact.  Cardiovascular: overall no swelling, no varicosities, no edema bilaterally, normal  temperatures of the legs and arms, no clubbing, cyanosis and good capillary refill.  Lymphatic: palpation is normal.  All other systems reviewed and are negative   The right knee has some crepitus, ROM 0 to 115, slight effusion, stable.  She has no limp today.  She has little to no pain.  The patient has been educated about the nature of the problem(s) and counseled on treatment options.  The patient appeared to understand what I have discussed and is in agreement with it.  Encounter Diagnosis  Name Primary?  . Chronic pain of right knee Yes    PLAN Call if any problems.  Precautions discussed.  Continue current medications.   Return to clinic prn   Electronically Signed Sanjuana Kava, MD 10/23/201811:35 AM

## 2017-01-04 ENCOUNTER — Ambulatory Visit: Payer: Medicare Other | Admitting: Orthopaedic Surgery

## 2017-01-19 ENCOUNTER — Other Ambulatory Visit: Payer: Self-pay | Admitting: *Deleted

## 2017-01-19 MED ORDER — LINACLOTIDE 72 MCG PO CAPS: 72.0000 ug | ORAL_CAPSULE | Freq: Every day | ORAL | 3 refills | Status: DC

## 2017-01-31 ENCOUNTER — Other Ambulatory Visit: Payer: Self-pay | Admitting: Family Medicine

## 2017-01-31 DIAGNOSIS — Z1231 Encounter for screening mammogram for malignant neoplasm of breast: Secondary | ICD-10-CM

## 2017-02-09 ENCOUNTER — Ambulatory Visit (HOSPITAL_COMMUNITY): Payer: Medicare Other

## 2017-02-09 ENCOUNTER — Other Ambulatory Visit: Payer: Self-pay | Admitting: Family Medicine

## 2017-02-09 ENCOUNTER — Encounter (HOSPITAL_COMMUNITY): Payer: Self-pay

## 2017-02-09 ENCOUNTER — Ambulatory Visit (HOSPITAL_COMMUNITY)
Admission: RE | Admit: 2017-02-09 | Discharge: 2017-02-09 | Disposition: A | Discharge status: Home / Self Care | Payer: Medicare Other | Source: Ambulatory Visit | Attending: Family Medicine | Admitting: Family Medicine

## 2017-02-09 DIAGNOSIS — Z1231 Encounter for screening mammogram for malignant neoplasm of breast: Secondary | ICD-10-CM | POA: Insufficient documentation

## 2017-02-16 DIAGNOSIS — N183 Chronic kidney disease, stage 3 (moderate): Secondary | ICD-10-CM | POA: Diagnosis not present

## 2017-02-16 DIAGNOSIS — Z79899 Other long term (current) drug therapy: Secondary | ICD-10-CM | POA: Diagnosis not present

## 2017-02-16 DIAGNOSIS — D509 Iron deficiency anemia, unspecified: Secondary | ICD-10-CM | POA: Diagnosis not present

## 2017-02-16 DIAGNOSIS — I10 Essential (primary) hypertension: Secondary | ICD-10-CM | POA: Diagnosis not present

## 2017-02-16 DIAGNOSIS — R809 Proteinuria, unspecified: Secondary | ICD-10-CM | POA: Diagnosis not present

## 2017-02-23 DIAGNOSIS — I10 Essential (primary) hypertension: Secondary | ICD-10-CM | POA: Diagnosis not present

## 2017-02-23 DIAGNOSIS — E870 Hyperosmolality and hypernatremia: Secondary | ICD-10-CM | POA: Diagnosis not present

## 2017-02-23 DIAGNOSIS — N183 Chronic kidney disease, stage 3 (moderate): Secondary | ICD-10-CM | POA: Diagnosis not present

## 2017-02-23 DIAGNOSIS — E559 Vitamin D deficiency, unspecified: Secondary | ICD-10-CM | POA: Diagnosis not present

## 2017-02-27 DIAGNOSIS — G890 Central pain syndrome: Secondary | ICD-10-CM | POA: Diagnosis not present

## 2017-02-27 DIAGNOSIS — G43C1 Periodic headache syndromes in child or adult, intractable: Secondary | ICD-10-CM | POA: Diagnosis not present

## 2017-02-27 DIAGNOSIS — M542 Cervicalgia: Secondary | ICD-10-CM | POA: Diagnosis not present

## 2017-02-27 DIAGNOSIS — I10 Essential (primary) hypertension: Secondary | ICD-10-CM | POA: Diagnosis not present

## 2017-02-28 ENCOUNTER — Encounter: Payer: Self-pay | Admitting: Orthopaedic Surgery

## 2017-02-28 ENCOUNTER — Ambulatory Visit: Payer: Medicare Other | Admitting: Orthopaedic Surgery

## 2017-02-28 VITALS — BP 124/76 | HR 84 | Ht 63.0 in | Wt 158.0 lb

## 2017-02-28 DIAGNOSIS — G8929 Other chronic pain: Secondary | ICD-10-CM

## 2017-02-28 DIAGNOSIS — M25561 Pain in right knee: Secondary | ICD-10-CM

## 2017-02-28 NOTE — Progress Notes (Signed)
CC:  I have pain of my right knee. I would like an injection.  The patient has chronic pain of the right knee.  There is no recent trauma.  There is no redness.  Injections in the past have helped.  The knee has no redness, has an effusion and crepitus present.  ROM of the right knee is 0-100.  Impression:  Chronic knee pain right  Return: prn  PROCEDURE NOTE:  The patient requests injections of the right knee , verbal consent was obtained.  The right knee was prepped appropriately after time out was performed.   Sterile technique was observed and injection of 1 cc of Depo-Medrol 40 mg with several cc's of plain xylocaine. Anesthesia was provided by ethyl chloride and a 20-gauge needle was used to inject the knee area. The injection was tolerated well.  A band aid dressing was applied.  The patient was advised to apply ice later today and tomorrow to the injection sight as needed.  Electronically Signed Sanjuana Kava, MD 12/18/20182:36 PM

## 2017-03-01 ENCOUNTER — Other Ambulatory Visit: Payer: Self-pay | Admitting: Family Medicine

## 2017-05-02 ENCOUNTER — Encounter (HOSPITAL_COMMUNITY): Payer: Self-pay | Admitting: Emergency Medicine

## 2017-05-02 ENCOUNTER — Emergency Department (HOSPITAL_COMMUNITY): Payer: Medicare Other

## 2017-05-02 ENCOUNTER — Emergency Department (HOSPITAL_COMMUNITY)
Admission: EM | Admit: 2017-05-02 | Discharge: 2017-05-02 | Disposition: A | Discharge status: Home / Self Care | Payer: Medicare Other | Attending: Emergency Medicine | Admitting: Emergency Medicine

## 2017-05-02 ENCOUNTER — Other Ambulatory Visit: Payer: Self-pay

## 2017-05-02 DIAGNOSIS — B349 Viral infection, unspecified: Secondary | ICD-10-CM | POA: Insufficient documentation

## 2017-05-02 DIAGNOSIS — I129 Hypertensive chronic kidney disease with stage 1 through stage 4 chronic kidney disease, or unspecified chronic kidney disease: Secondary | ICD-10-CM | POA: Insufficient documentation

## 2017-05-02 DIAGNOSIS — Z8541 Personal history of malignant neoplasm of cervix uteri: Secondary | ICD-10-CM | POA: Insufficient documentation

## 2017-05-02 DIAGNOSIS — N183 Chronic kidney disease, stage 3 (moderate): Secondary | ICD-10-CM | POA: Insufficient documentation

## 2017-05-02 DIAGNOSIS — Z79899 Other long term (current) drug therapy: Secondary | ICD-10-CM | POA: Insufficient documentation

## 2017-05-02 DIAGNOSIS — E039 Hypothyroidism, unspecified: Secondary | ICD-10-CM | POA: Insufficient documentation

## 2017-05-02 DIAGNOSIS — Z7982 Long term (current) use of aspirin: Secondary | ICD-10-CM | POA: Diagnosis not present

## 2017-05-02 DIAGNOSIS — R509 Fever, unspecified: Secondary | ICD-10-CM | POA: Diagnosis not present

## 2017-05-02 DIAGNOSIS — R05 Cough: Secondary | ICD-10-CM | POA: Diagnosis not present

## 2017-05-02 DIAGNOSIS — M791 Myalgia, unspecified site: Secondary | ICD-10-CM | POA: Diagnosis not present

## 2017-05-02 HISTORY — DX: Disorder of kidney and ureter, unspecified: N28.9

## 2017-05-02 LAB — CBC WITH DIFFERENTIAL/PLATELET
BASOS ABS: 0 10*3/uL (ref 0.0–0.1)
BASOS PCT: 0 %
Eosinophils Absolute: 0.1 10*3/uL (ref 0.0–0.7)
Eosinophils Relative: 2 %
HCT: 41.6 % (ref 36.0–46.0)
HEMOGLOBIN: 13.4 g/dL (ref 12.0–15.0)
Lymphocytes Relative: 17 %
Lymphs Abs: 1.1 10*3/uL (ref 0.7–4.0)
MCH: 31.2 pg (ref 26.0–34.0)
MCHC: 32.2 g/dL (ref 30.0–36.0)
MCV: 97 fL (ref 78.0–100.0)
Monocytes Absolute: 0.4 10*3/uL (ref 0.1–1.0)
Monocytes Relative: 6 %
NEUTROS ABS: 4.5 10*3/uL (ref 1.7–7.7)
NEUTROS PCT: 75 %
Platelets: 299 10*3/uL (ref 150–400)
RBC: 4.29 MIL/uL (ref 3.87–5.11)
RDW: 14.5 % (ref 11.5–15.5)
WBC: 6.1 10*3/uL (ref 4.0–10.5)

## 2017-05-02 LAB — BASIC METABOLIC PANEL
ANION GAP: 10 (ref 5–15)
BUN: 16 mg/dL (ref 6–20)
CO2: 26 mmol/L (ref 22–32)
CREATININE: 1.3 mg/dL — AB (ref 0.44–1.00)
Calcium: 9.2 mg/dL (ref 8.9–10.3)
Chloride: 102 mmol/L (ref 101–111)
GFR calc non Af Amer: 38 mL/min — ABNORMAL LOW (ref >60)
GFR, EST AFRICAN AMERICAN: 44 mL/min — AB (ref >60)
Glucose, Bld: 137 mg/dL — ABNORMAL HIGH (ref 65–99)
POTASSIUM: 3.8 mmol/L (ref 3.5–5.1)
SODIUM: 138 mmol/L (ref 135–145)

## 2017-05-02 MED ORDER — ACETAMINOPHEN 500 MG PO TABS
1000.0000 mg | ORAL_TABLET | Freq: Once | ORAL | Status: AC
  Administered 2017-05-02: 1000 mg via ORAL

## 2017-05-02 MED ORDER — OSELTAMIVIR PHOSPHATE 75 MG PO CAPS: 75.0000 mg | ORAL_CAPSULE | Freq: Two times a day (BID) | ORAL | 0 refills | Status: DC

## 2017-05-02 MED ORDER — ACETAMINOPHEN 500 MG PO TABS
ORAL_TABLET | ORAL | Status: AC
  Filled 2017-05-02: qty 2

## 2017-05-02 MED ORDER — OSELTAMIVIR PHOSPHATE 75 MG PO CAPS
75.0000 mg | ORAL_CAPSULE | Freq: Once | ORAL | Status: AC
  Administered 2017-05-02: 75 mg via ORAL
  Filled 2017-05-02: qty 1

## 2017-05-02 NOTE — ED Provider Notes (Signed)
[facility] EMERGENCY DEPARTMENT Provider Note   CSN: [id number] Arrival date & time: 05/02/17  4503     History   Chief Complaint Chief Complaint  Patient presents with  . Fever    HPI [name] is a 83 y.o. female.  Cough, fever, body aches since this morning.  Patient is ambulatory and eating well.  T-max was 101.  No rusty sputum, stiff neck, behavioral changes.  Severity of symptoms is moderate.  Nothing makes symptoms better or worse      Past Medical History:  Diagnosis Date  . Allergy   . Arthritis   . Colon cancer ([place])    colon ca dx 07/30/09  . History of colon cancer 06/2009   found at time of TCS 06/29/09, 1.2cm sessile cecal polyp, no adjuvent therapy needed  . HTN (hypertension)   . Hyperlipidemia   . Hypothyroidism   . Renal disorder    cyst on kidney   . Stroke ([place])   . Vertigo     Patient Active Problem List   Diagnosis Date Noted  . Insomnia 12/15/2015  . Paresthesia of left upper and lower extremity 11/21/2015  . Left leg weakness 11/21/2015  . Paresthesias   . Essential hypertension 11/02/2015  . Hypothyroidism 11/02/2015  . Vertigo 11/02/2015  . CKD (chronic kidney disease), stage III ([place]) 11/02/2015  . Loss of weight 11/02/2015  . OA (osteoarthritis) of knee 11/02/2015  . Colon cancer ([place]) 02/28/2011  . History of colon cancer 06/29/2010  . Constipation 06/10/2009    Past Surgical History:  Procedure Laterality Date  . ABDOMINAL HYSTERECTOMY    . COLON SURGERY  07/2009   right hemicolectomy, no residual colon cancer on path  . COLONOSCOPY  07/16/2010   [id number] POLYP-TCS 3 YEARS  . COLONOSCOPY N/A 08/02/2013   Procedure: COLONOSCOPY;  Surgeon: Danie Binder, MD;  Location[facility];  Service: Endoscopy;  Laterality: N/A;  9:30  . PARTIAL HYSTERECTOMY    . PARTIAL THYMECTOMY    . partial thyroidectomy     benign tumors    OB History    No data available       Home Medications    Prior to  Admission medications   Medication Sig Start Date End Date Taking? Authorizing Provider  acetaminophen (TYLENOL) 500 MG tablet Take 1,000 mg by mouth every 8 (eight) hours as needed (cold).    [provider]  amLODipine (NORVASC) 10 MG tablet Take 10 mg by mouth daily.    [provider]  aspirin EC 81 MG tablet Take 2 tablets (162 mg total) by mouth daily. 03/04/16   Alycia Rossetti, MD  atorvastatin (LIPITOR) 80 MG tablet TAKE (1) TABLET BY MOUTH ONCE A DAY. 02/09/17   Alycia Rossetti, MD  levothyroxine (SYNTHROID, LEVOTHROID) 25 MCG tablet TAKE 1 TABLET DAILY BEFORE BREAKFAST. 03/01/17   Alycia Rossetti, MD  linaclotide [facility]) 72 MCG capsule Take 1 capsule (72 mcg total) daily before breakfast by mouth. 01/19/17   Alycia Rossetti, MD  oseltamivir (TAMIFLU) 75 MG capsule Take 1 capsule (75 mg total) by mouth every 12 (twelve) hours. 05/02/17   Nat Christen, MD  RESTASIS 0.05 % ophthalmic emulsion Place 1 drop into both eyes 2 (two) times daily.  05/17/10   [provider]  traZODone (DESYREL) 50 MG tablet Take 0.5-1 tablets (25-50 mg total) by mouth at bedtime as needed for sleep. 12/15/15   Alycia Rossetti, MD  Family History Family History  Problem Relation Age of Onset  . Colon cancer Mother        >age60  . Arthritis Mother   . Cancer Mother   . Heart disease Mother   . Hyperlipidemia Mother   . Hypertension Mother   . Heart attack Father   . Heart disease Father   . Diabetes Maternal Aunt   . Hyperlipidemia Daughter   . Hypertension Daughter   . Liver disease Neg Hx     Social History Social History   Tobacco Use  . Smoking status: Never Smoker  . Smokeless tobacco: Never Used  Substance Use Topics  . Alcohol use: No    Alcohol/week: 0.0 oz  . Drug use: No     Allergies   Patient has no known allergies.   Review of Systems Review of Systems  All other systems reviewed and are negative.    Physical Exam Updated Vital  Signs BP 131/89 (BP Location: Right Arm)   Pulse (!) 102   Temp (!) 101.1 F (38.4 C) (Oral)   Resp 19   Ht 5\' 3"  (1.6 m)   Wt 68 kg (150 lb)   SpO2 95%   BMI 26.57 kg/m   Physical Exam  Constitutional: She is oriented to person, place, and time. She appears well-developed and well-nourished.  nad  HENT:  Head: Normocephalic and atraumatic.  Eyes: Conjunctivae are normal.  Neck: Neck supple.  Cardiovascular: Normal rate and regular rhythm.  Pulmonary/Chest: Effort normal and breath sounds normal.  Abdominal: Soft. Bowel sounds are normal.  Musculoskeletal: Normal range of motion.  Neurological: She is alert and oriented to person, place, and time.  Skin: Skin is warm and dry.  Psychiatric: She has a normal mood and affect. Her behavior is normal.  Nursing note and vitals reviewed.    ED Treatments / Results  Labs (all labs ordered are listed, but only abnormal results are displayed) Labs Reviewed  BASIC METABOLIC PANEL - Abnormal; Notable for the following components:      Result Value   Glucose, Bld 137 (*)    Creatinine, Ser 1.30 (*)    GFR calc non Af Amer 38 (*)    GFR calc Af Amer 44 (*)    All other components within normal limits  CBC WITH DIFFERENTIAL/PLATELET    EKG  EKG Interpretation None       Radiology Dg Chest 2 View  Result Date: 05/02/2017 CLINICAL DATA:  Cough since last evening with fever. EXAM: CHEST  2 VIEW COMPARISON:  11/21/2015 FINDINGS: The heart size and mediastinal contours are within normal limits. No pulmonary consolidation or edema. No pneumothorax nor effusion. There is atelectasis at the lung bases. The visualized skeletal structures are unremarkable. IMPRESSION: No active cardiopulmonary disease. Electronically Signed   By: Ashley Royalty M.D.   On: 05/02/2017 17:19    Procedures Procedures (including critical care time)  Medications Ordered in ED Medications  oseltamivir (TAMIFLU) capsule 75 mg (not administered)      Initial Impression / Assessment and Plan / ED Course  I have reviewed the triage vital signs and the nursing notes.  Pertinent labs & imaging results that were available during my care of the patient were reviewed by me and considered in my medical decision making (see chart for details).     Patient is nontoxic-appearing.  History and physical most consistent with viral syndrome.  Since his symptoms started this morning, patient may benefit from antiviral.  Will  Rx Tamiflu.  Final Clinical Impressions(s) / ED Diagnoses   Final diagnoses:  Viral syndrome    ED Discharge Orders        Ordered    oseltamivir (TAMIFLU) 75 MG capsule  Every 12 hours     05/02/17 2120       Nat Christen, MD 05/02/17 2129

## 2017-05-02 NOTE — Discharge Instructions (Signed)
Chest x-ray was negative.  Prescription for antiviral medicine called Tamiflu.  Increase fluids.  Tylenol for fever.  Rest.  You can take over-the-counter cough syrup that does not have a decongestant in it.  Return if worse.

## 2017-05-02 NOTE — ED Triage Notes (Signed)
Cough and fever started today, reports temp being 101 at the house

## 2017-05-02 NOTE — ED Notes (Signed)
Took tylenol 3 hrs ago. States was told not to take motrin

## 2017-05-12 ENCOUNTER — Other Ambulatory Visit: Payer: Self-pay

## 2017-05-12 ENCOUNTER — Encounter: Payer: Self-pay | Admitting: Family Medicine

## 2017-05-12 ENCOUNTER — Ambulatory Visit (INDEPENDENT_AMBULATORY_CARE_PROVIDER_SITE_OTHER): Payer: Medicare Other | Admitting: Family Medicine

## 2017-05-12 VITALS — BP 120/64 | HR 78 | Temp 98.9°F | Resp 14 | Ht 63.0 in | Wt 159.0 lb

## 2017-05-12 DIAGNOSIS — H6123 Impacted cerumen, bilateral: Secondary | ICD-10-CM

## 2017-05-12 DIAGNOSIS — E039 Hypothyroidism, unspecified: Secondary | ICD-10-CM | POA: Diagnosis not present

## 2017-05-12 DIAGNOSIS — I10 Essential (primary) hypertension: Secondary | ICD-10-CM | POA: Diagnosis not present

## 2017-05-12 DIAGNOSIS — N183 Chronic kidney disease, stage 3 unspecified: Secondary | ICD-10-CM

## 2017-05-12 MED ORDER — GABAPENTIN 100 MG PO CAPS: ORAL_CAPSULE | ORAL | 3 refills | Status: DC

## 2017-05-12 NOTE — Assessment & Plan Note (Signed)
Controlled.  

## 2017-05-12 NOTE — Progress Notes (Signed)
   Subjective:    Patient ID: [name], female    DOB: 1934-08-02, 83 y.o.   MRN: [id number]  Patient presents for Follow-up (is not fasting)  Here to follow-up chronic medical problems.  Medications reviewed.  She is still following with nephrology for her chronic kidney disease. Hypertension she is taking her blood pressure medicines as prescribed  Hypothyroidism she is on thyroid replacement  Hyperlipidemia she is on statin drug lipids at goal 6 months ago.  Was treated in the ER forViral illnes had high fever for 24 hours,  but due to risk given course of  Tamiflu on February 19, cough has resolved, but still feels fatigued, appetite is much better   CXR was neg, labs stable   Decreased hearing in right ear started this morning, she used wax drop and finially hearing came back in   Continues to have knee pain, will f/u orthopedics Dr. Luna Glasgow   On gabapentin from neurology- Dr. Merlene Laughter   Review Of Systems:  GEN- denies fatigue, fever, weight loss,weakness, recent illness HEENT- denies eye drainage, change in vision, nasal discharge, CVS- denies chest pain, palpitations RESP- denies SOB, cough, wheeze ABD- denies N/V, change in stools, abd pain GU- denies dysuria, hematuria, dribbling, incontinence MSK- + joint pain, muscle aches, injury Neuro- denies headache, dizziness, syncope, seizure activity       Objective:    BP 120/64   Pulse 78   Temp 98.9 F (37.2 C) (Oral)   Resp 14   Ht 5\' 3"  (1.6 m)   Wt 159 lb (72.1 kg)   SpO2 98%   BMI 28.17 kg/m  GEN- NAD, alert and oriented x3 HEENT- PERRL, EOMI, non injected sclera, pink conjunctiva, MMM, oropharynx clear, Wax impaction R > L canals, nares clear  S/p irrigation Canals clear bilat, TM in tact  Neck- Supple,no thyromegaly  CVS- RRR, no murmur RESP-CTAB EXT - No edema  Pulses- Radial, DP- 2+        Assessment & Plan:      Problem List Items Addressed This Visit      Unprioritized   Hypothyroidism    Check TFT  Much improved s/p flu treatment , recommend she be a little more active around the home, some exercise as tolerated       Relevant Orders   TSH   T3, free   T4, free   Essential hypertension - Primary    Controlled       CKD (chronic kidney disease), stage III (HCC)    Renal function stable, followed by nephrology       Other Visit Diagnoses    Bilateral impacted cerumen       improved s/p irrigation at bedside      Note: This dictation was prepared with Dragon dictation along with smaller phrase technology. Any transcriptional errors that result from this process are unintentional.

## 2017-05-12 NOTE — Assessment & Plan Note (Signed)
Check TFT  Much improved s/p flu treatment , recommend she be a little more active around the home, some exercise as tolerated

## 2017-05-12 NOTE — Assessment & Plan Note (Signed)
Renal function stable, followed by nephrology

## 2017-05-12 NOTE — Patient Instructions (Signed)
F/U 4 months  

## 2017-05-13 ENCOUNTER — Other Ambulatory Visit: Payer: Self-pay | Admitting: Family Medicine

## 2017-05-13 LAB — TSH: TSH: 0.26 mIU/L — ABNORMAL LOW (ref 0.40–4.50)

## 2017-05-13 LAB — T3, FREE: T3, Free: 2.7 pg/mL (ref 2.3–4.2)

## 2017-05-13 LAB — T4, FREE: FREE T4: 1.4 ng/dL (ref 0.8–1.8)

## 2017-05-16 ENCOUNTER — Encounter: Payer: Self-pay | Admitting: *Deleted

## 2017-05-23 ENCOUNTER — Other Ambulatory Visit: Payer: Self-pay | Admitting: *Deleted

## 2017-05-23 DIAGNOSIS — R7989 Other specified abnormal findings of blood chemistry: Secondary | ICD-10-CM

## 2017-05-30 ENCOUNTER — Other Ambulatory Visit: Payer: Self-pay | Admitting: Family Medicine

## 2017-06-29 ENCOUNTER — Other Ambulatory Visit: Payer: Medicare Other

## 2017-06-29 DIAGNOSIS — R7989 Other specified abnormal findings of blood chemistry: Secondary | ICD-10-CM | POA: Diagnosis not present

## 2017-06-29 DIAGNOSIS — Z1329 Encounter for screening for other suspected endocrine disorder: Secondary | ICD-10-CM | POA: Diagnosis not present

## 2017-06-29 DIAGNOSIS — E039 Hypothyroidism, unspecified: Secondary | ICD-10-CM | POA: Diagnosis not present

## 2017-06-29 LAB — T3, FREE: T3 FREE: 2.7 pg/mL (ref 2.3–4.2)

## 2017-06-29 LAB — TSH: TSH: 0.47 m[IU]/L (ref 0.40–4.50)

## 2017-06-29 LAB — T4, FREE: Free T4: 1.3 ng/dL (ref 0.8–1.8)

## 2017-07-04 ENCOUNTER — Encounter: Payer: Self-pay | Admitting: *Deleted

## 2017-07-06 DIAGNOSIS — Z79899 Other long term (current) drug therapy: Secondary | ICD-10-CM | POA: Diagnosis not present

## 2017-07-06 DIAGNOSIS — N183 Chronic kidney disease, stage 3 (moderate): Secondary | ICD-10-CM | POA: Diagnosis not present

## 2017-07-06 DIAGNOSIS — D509 Iron deficiency anemia, unspecified: Secondary | ICD-10-CM | POA: Diagnosis not present

## 2017-07-06 DIAGNOSIS — I10 Essential (primary) hypertension: Secondary | ICD-10-CM | POA: Diagnosis not present

## 2017-07-06 DIAGNOSIS — R809 Proteinuria, unspecified: Secondary | ICD-10-CM | POA: Diagnosis not present

## 2017-07-11 ENCOUNTER — Ambulatory Visit (INDEPENDENT_AMBULATORY_CARE_PROVIDER_SITE_OTHER): Payer: Medicare Other | Admitting: Orthopedic Surgery

## 2017-07-11 VITALS — BP 130/68 | HR 90 | Ht 63.0 in | Wt 159.0 lb

## 2017-07-11 DIAGNOSIS — M25561 Pain in right knee: Secondary | ICD-10-CM

## 2017-07-11 DIAGNOSIS — N183 Chronic kidney disease, stage 3 (moderate): Secondary | ICD-10-CM | POA: Diagnosis not present

## 2017-07-11 DIAGNOSIS — G8929 Other chronic pain: Secondary | ICD-10-CM

## 2017-07-11 DIAGNOSIS — E870 Hyperosmolality and hypernatremia: Secondary | ICD-10-CM | POA: Diagnosis not present

## 2017-07-11 DIAGNOSIS — I10 Essential (primary) hypertension: Secondary | ICD-10-CM | POA: Diagnosis not present

## 2017-07-11 DIAGNOSIS — D649 Anemia, unspecified: Secondary | ICD-10-CM | POA: Diagnosis not present

## 2017-07-11 NOTE — Progress Notes (Signed)
Chief Complaint  Patient presents with  . Follow-up    Recheck on right knee.   Procedure note right knee injection verbal consent was obtained to inject right knee joint  Timeout was completed to confirm the site of injection  The medications used were 40 mg of Depo-Medrol and 1% lidocaine 3 cc  Anesthesia was provided by ethyl chloride and the skin was prepped with alcohol.  After cleaning the skin with alcohol a 20-gauge needle was used to inject the right knee joint. There were no complications. A sterile bandage was applied.  We discussed possible other treatments including knee replacement and topical medications  She has stage III kidney disease.  She did see the kidney specialist today and got a good checkup  However, I would opt for nonoperative treatment as long as possible and if she does need knee replacement perhaps tertiary care facility with a better handle that risk.  This was explained to the patient and her daughter she will follow with Dr. Luna Glasgow and an as-needed basis

## 2017-07-11 NOTE — Patient Instructions (Signed)

## 2017-07-12 DIAGNOSIS — Z9289 Personal history of other medical treatment: Secondary | ICD-10-CM

## 2017-07-12 HISTORY — DX: Personal history of other medical treatment: Z92.89

## 2017-07-17 ENCOUNTER — Other Ambulatory Visit: Payer: Self-pay

## 2017-07-17 ENCOUNTER — Emergency Department (HOSPITAL_COMMUNITY): Payer: Medicare Other

## 2017-07-17 ENCOUNTER — Emergency Department (HOSPITAL_COMMUNITY)
Admission: EM | Admit: 2017-07-17 | Discharge: 2017-07-17 | Disposition: A | Discharge status: Home / Self Care | Payer: Medicare Other | Attending: Emergency Medicine | Admitting: Emergency Medicine

## 2017-07-17 ENCOUNTER — Encounter (HOSPITAL_COMMUNITY): Payer: Self-pay | Admitting: Emergency Medicine

## 2017-07-17 DIAGNOSIS — N183 Chronic kidney disease, stage 3 (moderate): Secondary | ICD-10-CM | POA: Insufficient documentation

## 2017-07-17 DIAGNOSIS — Z7982 Long term (current) use of aspirin: Secondary | ICD-10-CM | POA: Insufficient documentation

## 2017-07-17 DIAGNOSIS — R55 Syncope and collapse: Secondary | ICD-10-CM | POA: Insufficient documentation

## 2017-07-17 DIAGNOSIS — R000 Tachycardia, unspecified: Secondary | ICD-10-CM | POA: Diagnosis not present

## 2017-07-17 DIAGNOSIS — E039 Hypothyroidism, unspecified: Secondary | ICD-10-CM | POA: Diagnosis not present

## 2017-07-17 DIAGNOSIS — Z87898 Personal history of other specified conditions: Secondary | ICD-10-CM

## 2017-07-17 DIAGNOSIS — R002 Palpitations: Secondary | ICD-10-CM | POA: Diagnosis not present

## 2017-07-17 DIAGNOSIS — Z79899 Other long term (current) drug therapy: Secondary | ICD-10-CM | POA: Diagnosis not present

## 2017-07-17 DIAGNOSIS — R531 Weakness: Secondary | ICD-10-CM | POA: Diagnosis not present

## 2017-07-17 DIAGNOSIS — I129 Hypertensive chronic kidney disease with stage 1 through stage 4 chronic kidney disease, or unspecified chronic kidney disease: Secondary | ICD-10-CM | POA: Insufficient documentation

## 2017-07-17 LAB — URINALYSIS, ROUTINE W REFLEX MICROSCOPIC
Bilirubin Urine: NEGATIVE
GLUCOSE, UA: NEGATIVE mg/dL
Ketones, ur: NEGATIVE mg/dL
Nitrite: NEGATIVE
PH: 6 (ref 5.0–8.0)
Protein, ur: NEGATIVE mg/dL
SPECIFIC GRAVITY, URINE: 1.005 (ref 1.005–1.030)

## 2017-07-17 LAB — CBC
HCT: 40.3 % (ref 36.0–46.0)
Hemoglobin: 13.1 g/dL (ref 12.0–15.0)
MCH: 31.4 pg (ref 26.0–34.0)
MCHC: 32.5 g/dL (ref 30.0–36.0)
MCV: 96.6 fL (ref 78.0–100.0)
PLATELETS: 371 10*3/uL (ref 150–400)
RBC: 4.17 MIL/uL (ref 3.87–5.11)
RDW: 14.6 % (ref 11.5–15.5)
WBC: 7.8 10*3/uL (ref 4.0–10.5)

## 2017-07-17 LAB — BASIC METABOLIC PANEL
ANION GAP: 9 (ref 5–15)
BUN: 23 mg/dL — AB (ref 6–20)
CALCIUM: 9.3 mg/dL (ref 8.9–10.3)
CO2: 27 mmol/L (ref 22–32)
Chloride: 100 mmol/L — ABNORMAL LOW (ref 101–111)
Creatinine, Ser: 1.11 mg/dL — ABNORMAL HIGH (ref 0.44–1.00)
GFR calc Af Amer: 53 mL/min — ABNORMAL LOW (ref >60)
GFR, EST NON AFRICAN AMERICAN: 46 mL/min — AB (ref >60)
Glucose, Bld: 119 mg/dL — ABNORMAL HIGH (ref 65–99)
Potassium: 3.7 mmol/L (ref 3.5–5.1)
SODIUM: 136 mmol/L (ref 135–145)

## 2017-07-17 LAB — TROPONIN I

## 2017-07-17 NOTE — ED Notes (Signed)
Ambulated pt to bathroom. No complaints of dizziness.

## 2017-07-17 NOTE — ED Triage Notes (Signed)
Pt c/o of a high heart rate this morning when waking up.  HR 87 in triage.  Denies cp or sob.

## 2017-07-17 NOTE — ED Notes (Signed)
Gave EKG to Dr. Lacinda Axon

## 2017-07-17 NOTE — ED Provider Notes (Signed)
[facility] EMERGENCY DEPARTMENT Provider Note   CSN: [id number] Arrival date & time: 07/17/17  1355     History   Chief Complaint Chief Complaint  Patient presents with  . Near Syncope    HPI [name] is a 83 y.o. female.  HPI  Pt was seen at 1620. Per pt, c/o sudden onset and resolution of one episode of near syncope that occurred this morning PTA. Pt states she was about to open the refrigerator when she felt lightheaded and her "heart was pounding." Pt states she sat down with improvement of her symptoms. Pt endorses many months hx of "waking up with a fast HR," and being evaluated by her PMD for same. States she was instructed to f/u with Cards MD but "hasn't." Denies CP, no SOB/cough, no abd pain, no N/V/D, no focal motor weakness, no tingling/numbness in extremities, no syncope, no calf/LE pain or unilateral swelling.    Past Medical History:  Diagnosis Date  . Allergy   . Arthritis   . Colon cancer ([place])    colon ca dx 07/30/09  . History of colon cancer 06/2009   found at time of TCS 06/29/09, 1.2cm sessile cecal polyp, no adjuvent therapy needed  . HTN (hypertension)   . Hyperlipidemia   . Hypothyroidism   . Renal disorder    cyst on kidney   . Stroke ([place])   . Vertigo     Patient Active Problem List   Diagnosis Date Noted  . Insomnia 12/15/2015  . Paresthesia of left upper and lower extremity 11/21/2015  . Left leg weakness 11/21/2015  . Paresthesias   . Essential hypertension 11/02/2015  . Hypothyroidism 11/02/2015  . Vertigo 11/02/2015  . CKD (chronic kidney disease), stage III ([place]) 11/02/2015  . Loss of weight 11/02/2015  . OA (osteoarthritis) of knee 11/02/2015  . Colon cancer ([place]) 02/28/2011  . History of colon cancer 06/29/2010  . Constipation 06/10/2009    Past Surgical History:  Procedure Laterality Date  . ABDOMINAL HYSTERECTOMY    . COLON SURGERY  07/2009   right hemicolectomy, no residual colon cancer on path  .  COLONOSCOPY  07/16/2010   CHY:IFOYDXAJOINO POLYP-TCS 3 YEARS  . COLONOSCOPY N/A 08/02/2013   Procedure: COLONOSCOPY;  Surgeon: Danie Binder, MD;  Location: [facility] ENDO SUITE;  Service: Endoscopy;  Laterality: N/A;  9:30  . PARTIAL HYSTERECTOMY    . PARTIAL THYMECTOMY    . partial thyroidectomy     benign tumors     OB History   None      Home Medications    Prior to Admission medications   Medication Sig Start Date End Date Taking? Authorizing Provider  acetaminophen (TYLENOL) 500 MG tablet Take 1,000 mg by mouth every 8 (eight) hours as needed (cold).   Yes [provider]  amLODipine (NORVASC) 10 MG tablet Take 10 mg by mouth daily.   Yes [provider]  aspirin EC 81 MG tablet Take 2 tablets (162 mg total) by mouth daily. 03/04/16  Yes [place], Modena Nunnery, MD  atorvastatin (LIPITOR) 80 MG tablet TAKE (1) TABLET BY MOUTH ONCE A DAY. 05/15/17  Yes [place], Modena Nunnery, MD  Cholecalciferol (VITAMIN D) 2000 units CAPS Take 1,000 Units by mouth daily.   Yes [provider]  gabapentin (NEURONTIN) 100 MG capsule Take 1 in the morning and 2 in the evening Patient taking differently: Take 100-200 mg by mouth 2 (two) times daily. Take 1 in the morning and 2 in  the evening 05/12/17  Yes [place], Modena Nunnery, MD  levothyroxine (SYNTHROID, LEVOTHROID) 25 MCG tablet TAKE 1 TABLET DAILY BEFORE BREAKFAST. 05/30/17  Yes [place], Modena Nunnery, MD  linaclotide [facility]) 72 MCG capsule Take 1 capsule (72 mcg total) daily before breakfast by mouth. Patient taking differently: Take 72 mcg by mouth every other day.  01/19/17  Yes [place], Modena Nunnery, MD  RESTASIS 0.05 % ophthalmic emulsion Place 1 drop into both eyes 2 (two) times daily.  05/17/10  Yes [provider]    Family History Family History  Problem Relation Age of Onset  . Colon cancer Mother        >age60  . Arthritis Mother   . Cancer Mother   . Heart disease Mother   . Hyperlipidemia Mother   . Hypertension Mother     . Heart attack Father   . Heart disease Father   . Diabetes Maternal Aunt   . Hyperlipidemia Daughter   . Hypertension Daughter   . Liver disease Neg Hx     Social History Social History   Tobacco Use  . Smoking status: Never Smoker  . Smokeless tobacco: Never Used  Substance Use Topics  . Alcohol use: No    Alcohol/week: 0.0 oz  . Drug use: No     Allergies   Patient has no known allergies.   Review of Systems Review of Systems ROS: Statement: All systems negative except as marked or noted in the HPI; Constitutional: Negative for fever and chills. ; ; Eyes: Negative for eye pain, redness and discharge. ; ; ENMT: Negative for ear pain, hoarseness, nasal congestion, sinus pressure and sore throat. ; ; Cardiovascular: +"morning palpitations for months." Negative for chest pain, diaphoresis, dyspnea and peripheral edema. ; ; Respiratory: Negative for cough, wheezing and stridor. ; ; Gastrointestinal: Negative for nausea, vomiting, diarrhea, abdominal pain, blood in stool, hematemesis, jaundice and rectal bleeding. . ; ; Genitourinary: Negative for dysuria, flank pain and hematuria. ; ; Musculoskeletal: Negative for back pain and neck pain. Negative for swelling and trauma.; ; Skin: Negative for pruritus, rash, abrasions, blisters, bruising and skin lesion.; ; Neuro: +lightheadedness. Negative for headache and neck stiffness. Negative for weakness, altered level of consciousness, altered mental status, extremity weakness, paresthesias, involuntary movement, seizure and syncope.       Physical Exam Updated Vital Signs BP (!) 154/74   Pulse 83   Temp 98.4 F (36.9 C) (Oral)   Resp 18   Ht 5\' 3"  (1.6 m)   Wt 72.1 kg (159 lb)   SpO2 100%   BMI 28.17 kg/m   Physical Exam 1625: Physical examination:  Nursing notes reviewed; Vital signs and O2 SAT reviewed;  Constitutional: Well developed, Well nourished, Well hydrated, In no acute distress; Head:  Normocephalic, atraumatic;  Eyes: EOMI, PERRL, No scleral icterus; ENMT: Mouth and pharynx normal, Mucous membranes moist; Neck: Supple, Full range of motion, No lymphadenopathy; Cardiovascular: Regular rate and rhythm, No gallop; Respiratory: Breath sounds clear & equal bilaterally, No wheezes.  Speaking full sentences with ease, Normal respiratory effort/excursion; Chest: Nontender, Movement normal; Abdomen: Soft, Nontender, Nondistended, Normal bowel sounds; Genitourinary: No CVA tenderness; Extremities: Peripheral pulses normal, No tenderness, No edema, No calf edema or asymmetry.; Neuro: AA&Ox3, Major CN grossly intact. Speech clear.  No facial droop.  No nystagmus. Grips equal. Strength 5/5 equal bilat UE's and LE's.  DTR 2/4 equal bilat UE's and LE's.  No gross sensory deficits.  Normal cerebellar testing bilat UE's (finger-nose) and LE's (  heel-shin). ; Skin: Color normal, Warm, Dry.    ED Treatments / Results  Labs (all labs ordered are listed, but only abnormal results are displayed)   EKG EKG Interpretation  Date/Time:  Monday Jul 17 2017 14:01:34 EDT Ventricular Rate:  87 PR Interval:  156 QRS Duration: 72 QT Interval:  350 QTC Calculation: 421 R Axis:   -25 Text Interpretation:  Normal sinus rhythm Minimal voltage criteria for LVH, may be normal variant Inferior infarct , age undetermined Anterolateral infarct , age undetermined Artifact When compared with ECG of 11/21/2015 No significant change was found Confirmed by Francine Graven [phone]) on 07/17/2017 4:45:59 PM   Radiology   Procedures Procedures (including critical care time)  Medications Ordered in ED Medications - No data to display   Initial Impression / Assessment and Plan / ED Course  I have reviewed the triage vital signs and the nursing notes.  Pertinent labs & imaging results that were available during my care of the patient were reviewed by me and considered in my medical decision making (see chart for details).  MDM Reviewed:  previous chart, nursing note and vitals Reviewed previous: labs and ECG Interpretation: labs, ECG, x-ray and CT scan   Results for orders placed or performed during the hospital encounter of 07/17/17  CBC  Result Value Ref Range   WBC 7.8 4.0 - 10.5 K/uL   RBC 4.17 3.87 - 5.11 MIL/uL   Hemoglobin 13.1 12.0 - 15.0 g/dL   HCT 40.3 36.0 - 46.0 %   MCV 96.6 78.0 - 100.0 fL   MCH 31.4 26.0 - 34.0 pg   MCHC 32.5 30.0 - 36.0 g/dL   RDW 14.6 11.5 - 15.5 %   Platelets 371 150 - 400 K/uL  Basic metabolic panel  Result Value Ref Range   Sodium 136 135 - 145 mmol/L   Potassium 3.7 3.5 - 5.1 mmol/L   Chloride 100 (L) 101 - 111 mmol/L   CO2 27 22 - 32 mmol/L   Glucose, Bld 119 (H) 65 - 99 mg/dL   BUN 23 (H) 6 - 20 mg/dL   Creatinine, Ser 1.11 (H) 0.44 - 1.00 mg/dL   Calcium 9.3 8.9 - 10.3 mg/dL   GFR calc non Af Amer 46 (L) >60 mL/min   GFR calc Af Amer 53 (L) >60 mL/min   Anion gap 9 5 - 15  Troponin I  Result Value Ref Range   Troponin I <0.03 <0.03 ng/mL  Urinalysis, Routine w reflex microscopic  Result Value Ref Range   Color, Urine STRAW (A) YELLOW   APPearance CLEAR CLEAR   Specific Gravity, Urine 1.005 1.005 - 1.030   pH 6.0 5.0 - 8.0   Glucose, UA NEGATIVE NEGATIVE mg/dL   Hgb urine dipstick SMALL (A) NEGATIVE   Bilirubin Urine NEGATIVE NEGATIVE   Ketones, ur NEGATIVE NEGATIVE mg/dL   Protein, ur NEGATIVE NEGATIVE mg/dL   Nitrite NEGATIVE NEGATIVE   Leukocytes, UA MODERATE (A) NEGATIVE   RBC / HPF 0-5 0 - 5 RBC/hpf   WBC, UA 0-5 0 - 5 WBC/hpf   Bacteria, UA RARE (A) NONE SEEN   Squamous Epithelial / LPF 0-5 0 - 5   Dg Chest 2 View Result Date: 07/17/2017 CLINICAL DATA:  Acute onset of tachycardia this morning. EXAM: CHEST - 2 VIEW COMPARISON:  05/02/2017, 11/21/2015 and earlier, including CT chest 04/03/2013. FINDINGS: AP ERECT and LATERAL images were obtained. Cardiac silhouette normal in size for technique. Hilar and mediastinal contours unremarkable. Minimal  scarring  in the lingula, unchanged. Lungs otherwise clear. Pulmonary vascularity normal. Bronchovascular markings normal. No pleural effusions. No pneumothorax. Degenerative changes involving the thoracic spine. IMPRESSION: No acute cardiopulmonary disease. Electronically Signed   By: Evangeline Dakin M.D.   On: 07/17/2017 17:48   Ct Head Wo Contrast Result Date: 07/17/2017 CLINICAL DATA:  Weakness, elevated heart rate, no injury EXAM: CT HEAD WITHOUT CONTRAST TECHNIQUE: Contiguous axial images were obtained from the base of the skull through the vertex without intravenous contrast. COMPARISON:  CT brain scan of 12/01/2016 FINDINGS: Brain: The ventricular system is prominent as are the cortical sulci, consistent with diffuse atrophy. The septum remains midline in position. The fourth ventricle and basilar cisterns are unremarkable. There is moderate small vessel ischemic change throughout the periventricular white matter with small lacunar infarcts noted as well primarily in the anterior limb of the internal capsules bilaterally. However, no hemorrhage, acute infarction or mass effect is noted. Vascular: No vascular abnormality is noted on this unenhanced study Skull: On bone window images, no calvarial abnormality is seen. Sinuses/Orbits: The paranasal sinuses appear well pneumatized. Other: None. IMPRESSION: 1. No acute intracranial abnormality. 2. Stable atrophy and moderate small vessel ischemic change with small lacunar infarcts bilaterally. Electronically Signed   By: Ivar Drape M.D.   On: 07/17/2017 17:20    1845:  Workup reassuring. Doubt PE with low risk Wells'. No tachycardia while in the ED. Not orthostatic on VS. Ambulated with steady gait, easy resps, NAD. Denied any complaints. Pt states she is ready to go home now. Dx and testing d/w pt and family.  Questions answered.  Verb understanding, agreeable to d/c home with outpt f/u. Strongly encouraged to f/u with Cards MD regarding chronic, intermittent  palpitations.   Final Clinical Impressions(s) / ED Diagnoses   Final diagnoses:  None    ED Discharge Orders    None       Francine Graven, DO 07/21/17 1610

## 2017-07-17 NOTE — ED Notes (Signed)
EDP at bedside updating patient and family. 

## 2017-07-17 NOTE — ED Notes (Signed)
EDP at bedside  

## 2017-07-17 NOTE — Discharge Instructions (Signed)
Take your usual prescriptions as previously directed. Eat regular meals and keep yourself hydrated. Move slowly when changing positions.  Call your regular medical doctor tomorrow to schedule a follow up appointment within the next 2 days. Call the Cardiologist tomorrow to schedule a follow up appointment within the next week.  Return to the Emergency Department immediately sooner if worsening.

## 2017-07-19 ENCOUNTER — Encounter: Payer: Self-pay | Admitting: Family Medicine

## 2017-07-19 ENCOUNTER — Ambulatory Visit (INDEPENDENT_AMBULATORY_CARE_PROVIDER_SITE_OTHER): Payer: Medicare Other | Admitting: Family Medicine

## 2017-07-19 ENCOUNTER — Other Ambulatory Visit: Payer: Self-pay

## 2017-07-19 VITALS — Temp 98.1°F | Resp 16 | Ht 63.0 in | Wt 157.0 lb

## 2017-07-19 DIAGNOSIS — I951 Orthostatic hypotension: Secondary | ICD-10-CM | POA: Diagnosis not present

## 2017-07-19 DIAGNOSIS — R55 Syncope and collapse: Secondary | ICD-10-CM

## 2017-07-19 DIAGNOSIS — R002 Palpitations: Secondary | ICD-10-CM | POA: Diagnosis not present

## 2017-07-19 LAB — URINE CULTURE

## 2017-07-19 MED ORDER — AMLODIPINE BESYLATE 10 MG PO TABS: 5.0000 mg | ORAL_TABLET | Freq: Every day | ORAL | 3 refills | Status: DC

## 2017-07-19 NOTE — Progress Notes (Signed)
Orthostatic Blood Pressure:  Lying: 130/64  Sitting: 132/62  Standing: 118/58

## 2017-07-19 NOTE — Progress Notes (Signed)
   Subjective:    Patient ID: [name], female    DOB: 07-30-1934, 83 y.o.   MRN: [id number]  Patient presents for ER F/U (syncope)  Here for ER follow-up.  She was seen in the ER a few days ago after she had a presyncopal event.  States that she had been out and about running some errands felt well.  She went to her refrigerator later that evening and felt a sensation overcome her whole body she kind of slumped down but held onto the door so she would not fall.  She denies any chest pain or shortness of breath but admits to having recurrent episodes of palpitations typically in the morning but also feels son at night.  Nothing acute was found in the ER recommend that she do follow-up with cardiology.  She states that she been checking her blood pressure and have been on the lower in some days down to 1 12-4 19 systolic yesterday was back up to 132   Review Of Systems:  GEN- denies fatigue, fever, weight loss,weakness, recent illness HEENT- denies eye drainage, change in vision, nasal discharge, CVS- denies chest pain, palpitations RESP- denies SOB, cough, wheeze ABD- denies N/V, change in stools, abd pain GU- denies dysuria, hematuria, dribbling, incontinence MSK- denies joint pain, muscle aches, injury Neuro- denies headache, dizziness, syncope, seizure activity       Objective:    Temp 98.1 F (36.7 C) (Oral)   Resp 16   Ht 5\' 3"  (1.6 m)   Wt 157 lb (71.2 kg)   SpO2 99%   BMI 27.81 kg/m  GEN- NAD, alert and oriented x3 HEENT- PERRL, EOMI, non injected sclera, pink conjunctiva, MMM, oropharynx clear Neck- Supple, no thyromegaly, no bruit  CVS- RRR, no murmur RESP-CTAB ABD-NABS,soft,NT,ND Neuro- CNII-XII in tact, no focal deficits  EXT- No edema Pulses- Radial, 2+        Assessment & Plan:      Problem List Items Addressed This Visit    None    Visit Diagnoses    Palpitations    -  Primary   concern for intermittant arrhythmia that we may not be  taking up.  We will send her to cardiology to have a event monitor placed.  She also has some mild orthostasis on when to reduce her amlodipine down to 5 mg.  She is hydrated.  Discussed this with her as well as her daughter after the visit.  Cardiology appointment scheduled for tomorrow.  Reviewed all of her notations and imaging from the emergency room   Relevant Orders   Ambulatory referral to Cardiology   Near syncope       Relevant Medications   amLODipine (NORVASC) 10 MG tablet   Other Relevant Orders   Ambulatory referral to Cardiology   Orthostatic hypotension       Relevant Medications   amLODipine (NORVASC) 10 MG tablet      Note: This dictation was prepared with Dragon dictation along with smaller phrase technology. Any transcriptional errors that result from this process are unintentional.

## 2017-07-19 NOTE — Patient Instructions (Addendum)
Decrease amlodipine or norvasc to 5mg  Referral to cardiology  F/U as previous

## 2017-07-20 ENCOUNTER — Encounter: Payer: Self-pay | Admitting: Cardiovascular Disease

## 2017-07-20 ENCOUNTER — Ambulatory Visit: Payer: Medicare Other | Admitting: Cardiovascular Disease

## 2017-07-20 VITALS — BP 138/74 | HR 87 | Ht 63.0 in | Wt 159.0 lb

## 2017-07-20 DIAGNOSIS — R002 Palpitations: Secondary | ICD-10-CM | POA: Diagnosis not present

## 2017-07-20 DIAGNOSIS — I10 Essential (primary) hypertension: Secondary | ICD-10-CM | POA: Diagnosis not present

## 2017-07-20 DIAGNOSIS — E039 Hypothyroidism, unspecified: Secondary | ICD-10-CM | POA: Diagnosis not present

## 2017-07-20 DIAGNOSIS — Z8673 Personal history of transient ischemic attack (TIA), and cerebral infarction without residual deficits: Secondary | ICD-10-CM

## 2017-07-20 DIAGNOSIS — R5383 Other fatigue: Secondary | ICD-10-CM | POA: Diagnosis not present

## 2017-07-20 NOTE — Progress Notes (Signed)
CARDIOLOGY CONSULT NOTE  Patient ID: [name] MRN: [id number] DOB/AGE: 1934/11/26 83 y.o.  Admit date: (Not on file) Primary Physician: Alycia Rossetti, MD Referring Physician: Alycia Rossetti, MD  Reason for Consultation: Near syncope and palpitations  HPI: [name] is a 83 y.o. female who is being seen today for the evaluation of near syncope and palpitations at the request of Buelah Manis, Modena Nunnery, MD.   She was evaluated in the ED on 07/17/2017 for near syncope.  She experienced lightheadedness and palpitations.  I reviewed all relevant documentation, labs, and studies.  She sat down and symptoms subsided.  She has awoken several mornings with a fast heart rate. Chest x-ray showed no acute cardiopulmonary disease.  Head CT showed no acute intracranial abnormalities.  There was stable atrophy and moderate small vessel ischemic changes with small lacunar infarcts bilaterally.  Troponin was normal.  BUN 23, creatinine 1.11.  TSH was normal at 0.47 on 06/29/2017.  Most recent echocardiogram dated 11/22/2015 demonstrated vigorous left ventricular systolic function, LVEF 65 to 70%, mild LVH, grade 1 diastolic dysfunction with high ventricular filling pressures, moderate tricuspid and mild pulmonic regurgitation.  I personally reviewed the ECG performed on 07/17/2017 which showed sinus rhythm with possible old inferior and anterolateral infarct.  There appeared to be lead reversal with V2.  She has been experiencing palpitations for the past 3 months.  She usually experiences them in the morning in particular when lying down on her right side.  She denies exertional chest pain and dyspnea.  She does complain of exertional fatigue which has been going on for the past month.  This can occur with normal routine activities around the house.  She denies frank syncope.  She does have a history of vertigo and CVA.     No Known Allergies  Current Outpatient  Medications  Medication Sig Dispense Refill  . acetaminophen (TYLENOL) 500 MG tablet Take 1,000 mg by mouth every 8 (eight) hours as needed (cold).    Marland Kitchen amLODipine (NORVASC) 5 MG tablet Take 5 mg by mouth daily.    Marland Kitchen aspirin EC 81 MG tablet Take 2 tablets (162 mg total) by mouth daily.    Marland Kitchen atorvastatin (LIPITOR) 80 MG tablet TAKE (1) TABLET BY MOUTH ONCE A DAY. 90 tablet 0  . Cholecalciferol (VITAMIN D) 2000 units CAPS Take 1,000 Units by mouth daily.    Marland Kitchen gabapentin (NEURONTIN) 100 MG capsule Take 1 in the morning and 2 in the evening (Patient taking differently: Take 100-200 mg by mouth 2 (two) times daily. Take 1 in the morning and 2 in the evening) 90 capsule 3  . levothyroxine (SYNTHROID, LEVOTHROID) 25 MCG tablet TAKE 1 TABLET DAILY BEFORE BREAKFAST. 30 tablet 0  . linaclotide (LINZESS) 72 MCG capsule Take 1 capsule (72 mcg total) daily before breakfast by mouth. (Patient taking differently: Take 72 mcg by mouth every other day. ) 30 capsule 3  . RESTASIS 0.05 % ophthalmic emulsion Place 1 drop into both eyes 2 (two) times daily.      No current facility-administered medications for this visit.     Past Medical History:  Diagnosis Date  . Allergy   . Arthritis   . Colon cancer ([place])    colon ca dx 07/30/09  . History of colon cancer 06/2009   found at time of TCS 06/29/09, 1.2cm sessile cecal polyp, no adjuvent therapy needed  . HTN (hypertension)   . Hyperlipidemia   .  Hypothyroidism   . Renal disorder    cyst on kidney   . Stroke ([place])   . Vertigo     Past Surgical History:  Procedure Laterality Date  . ABDOMINAL HYSTERECTOMY    . COLON SURGERY  07/2009   right hemicolectomy, no residual colon cancer on path  . COLONOSCOPY  07/16/2010   [id number] POLYP-TCS 3 YEARS  . COLONOSCOPY N/A 08/02/2013   Procedure: COLONOSCOPY;  Surgeon: Danie Binder, MD;  Location: [facility];  Service: Endoscopy;  Laterality: N/A;  9:30  . PARTIAL HYSTERECTOMY    . PARTIAL  THYMECTOMY    . partial thyroidectomy     benign tumors    Social History   Socioeconomic History  . Marital status: Widowed    Spouse name: Not on file  . Number of children: 2  . Years of education: Not on file  . Highest education level: Not on file  Occupational History  . Occupation: Psychologist, occupational at [employer]  . 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 Financial resource strain: Not on file  . Food insecurity:    Worry: Not on file    Inability: Not on file  . Transportation needs:    Medical: Not on file    Non-medical: Not on file  Tobacco Use  . Smoking status: Never Smoker  . Smokeless tobacco: Never Used  Substance and Sexual Activity  . Alcohol use: No    Alcohol/week: 0.0 oz  . Drug use: No  . Sexual activity: Not Currently  Lifestyle  . Physical activity:    Days per week: Not on file    Minutes per session: Not on file  . Stress: Not on file  Relationships  . Social connections:    Talks on phone: Not on file    Gets together: Not on file    Attends religious service: Not on file    Active member of club or organization: Not on file    Attends meetings of clubs or organizations: Not on file    Relationship status: Not on file  . Intimate partner violence:    Fear of current or ex partner: Not on file    Emotionally abused: Not on file    Physically abused: Not on file    Forced sexual activity: Not on file  Other Topics Concern  . Not on file  Social History Narrative  . Not on file     No family history of premature CAD in 1st degree relatives.  Current Meds  Medication Sig  . acetaminophen (TYLENOL) 500 MG tablet Take 1,000 mg by mouth every 8 (eight) hours as needed (cold).  Marland Kitchen amLODipine (NORVASC) 5 MG tablet Take 5 mg by mouth daily.  Marland Kitchen aspirin EC 81 MG tablet Take 2 tablets (162 mg total) by mouth daily.  Marland Kitchen atorvastatin (LIPITOR) 80 MG tablet TAKE (1) TABLET BY MOUTH ONCE A DAY.  Marland Kitchen Cholecalciferol (VITAMIN D)  2000 units CAPS Take 1,000 Units by mouth daily.  Marland Kitchen gabapentin (NEURONTIN) 100 MG capsule Take 1 in the morning and 2 in the evening (Patient taking differently: Take 100-200 mg by mouth 2 (two) times daily. Take 1 in the morning and 2 in the evening)  . levothyroxine (SYNTHROID, LEVOTHROID) 25 MCG tablet TAKE 1 TABLET DAILY BEFORE BREAKFAST.  Marland Kitchen linaclotide (LINZESS) 72 MCG capsule Take 1 capsule (72 mcg total) daily before breakfast by mouth. (Patient taking differently: Take 72 mcg by mouth  every other day. )  . RESTASIS 0.05 % ophthalmic emulsion Place 1 drop into both eyes 2 (two) times daily.       Review of systems complete and found to be negative unless listed above in HPI    Physical exam Blood pressure 138/74, pulse 87, height 5\' 3"  (1.6 m), weight 159 lb (72.1 kg), SpO2 97 %. General: NAD Neck: No JVD, no thyromegaly or thyroid nodule.  Lungs: Clear to auscultation bilaterally with normal respiratory effort. CV: Nondisplaced PMI. Regular rate and rhythm, normal S1/S2, no S3/S4, no murmur.  No peripheral edema.  No carotid bruit.  Abdomen: Soft, nontender, no distention.  Skin: Intact without lesions or rashes.  Neurologic: Alert and oriented x 3.  Psych: Normal affect. Extremities: No clubbing or cyanosis.  HEENT: Normal.   ECG: Most recent ECG reviewed.   Labs: Lab Results  Component Value Date/Time   K 3.7 07/17/2017 02:36 PM   K 3.9 04/16/2015 08:12 AM   BUN 23 (H) 07/17/2017 02:36 PM   BUN 12.4 04/16/2015 08:12 AM   CREATININE 1.11 (H) 07/17/2017 02:36 PM   CREATININE 1.38 (H) 11/04/2016 08:54 AM   CREATININE 1.2 (H) 04/16/2015 08:12 AM   ALT 19 11/04/2016 08:54 AM   ALT 19 04/16/2015 08:12 AM   TSH 0.47 06/29/2017 09:44 AM   HGB 13.1 07/17/2017 02:36 PM   HGB 14.7 04/16/2015 08:12 AM     Lipids: Lab Results  Component Value Date/Time   LDLCALC 67 11/04/2016 08:54 AM   CHOL 142 11/04/2016 08:54 AM   TRIG 51 11/04/2016 08:54 AM   HDL 65 11/04/2016  08:54 AM        ASSESSMENT AND PLAN:  1.  Palpitations: She has a history of CVA and bilateral lacunar infarcts.  I will obtain a 1 week event monitor to assess for arrhythmias and atrial fibrillation in particular.  She is currently on aspirin.  2.  Fatigue: She describes exertional fatigue for the past month.  ECG is abnormal.  I will proceed with a nuclear myocardial perfusion imaging study to evaluate for ischemic heart disease (Lexiscan Myoview).  3.  History of CVA: Currently on aspirin 162 mg daily and Lipitor 80 mg daily.  4.  Hypothyroidism: TSH levels reviewed above.  Currently on levothyroxine 25 mcg daily.  5.  Hypertension: Blood pressure is normal on amlodipine 5 mg.  No changes.    Disposition: Follow up in 2 months   Signed: Kate Sable, M.D., F.A.C.C.  07/20/2017, 3:46 PM

## 2017-07-20 NOTE — Patient Instructions (Addendum)
Your physician wants you to follow-up in: 2 months with [facility] has requested that you have a lexiscan myoview. For further information please visit [email or web address]. Please follow instruction sheet, as given.    Your physician has recommended that you wear an event monitor for 1 week.. Event monitors are medical devices that record the heart's electrical activity. Doctors most often [place] these monitors to diagnose arrhythmias. Arrhythmias are problems with the speed or rhythm of the heartbeat. The monitor is a small, portable device. You can wear one while you do your normal daily activities. This is usually used to diagnose what is causing palpitations/syncope (passing out).    Your physician recommends that you continue on your current medications as directed. Please refer to the Current Medication list given to you today.     If you need a refill on your cardiac medications before your next appointment, please call your pharmacy.   No lab work ordered today.     Thank you for choosing [facility] !          Marland Kitchen

## 2017-07-31 ENCOUNTER — Other Ambulatory Visit: Payer: Self-pay | Admitting: Family Medicine

## 2017-08-01 ENCOUNTER — Encounter (HOSPITAL_COMMUNITY)
Admission: RE | Admit: 2017-08-01 | Discharge: 2017-08-01 | Disposition: A | Discharge status: Home / Self Care | Payer: Medicare Other | Source: Ambulatory Visit | Attending: Cardiovascular Disease | Admitting: Cardiovascular Disease

## 2017-08-01 ENCOUNTER — Encounter (HOSPITAL_COMMUNITY): Payer: Medicare Other

## 2017-08-01 ENCOUNTER — Encounter (HOSPITAL_COMMUNITY): Payer: Self-pay

## 2017-08-01 DIAGNOSIS — R002 Palpitations: Secondary | ICD-10-CM | POA: Diagnosis not present

## 2017-08-01 DIAGNOSIS — Q218 Other congenital malformations of cardiac septa: Secondary | ICD-10-CM | POA: Insufficient documentation

## 2017-08-01 LAB — NM MYOCAR MULTI W/SPECT W/WALL MOTION / EF
CHL CUP NUCLEAR SDS: 2
CHL CUP RESTING HR STRESS: 77 {beats}/min
LHR: 0.39
LV dias vol: 47 mL (ref 46–106)
LVSYSVOL: 8 mL
NUC STRESS TID: 1.36
Peak HR: 98 {beats}/min
SRS: 3
SSS: 5

## 2017-08-01 MED ORDER — TECHNETIUM TC 99M TETROFOSMIN IV KIT
30.0000 | PACK | Freq: Once | INTRAVENOUS | Status: AC | PRN
  Administered 2017-08-01: 32 via INTRAVENOUS

## 2017-08-01 MED ORDER — REGADENOSON 0.4 MG/5ML IV SOLN
INTRAVENOUS | Status: AC
  Administered 2017-08-01: 0.4 mg via INTRAVENOUS
  Filled 2017-08-01: qty 5

## 2017-08-01 MED ORDER — TECHNETIUM TC 99M TETROFOSMIN IV KIT
10.0000 | PACK | Freq: Once | INTRAVENOUS | Status: AC | PRN
  Administered 2017-08-01: 10.3 via INTRAVENOUS

## 2017-08-01 MED ORDER — SODIUM CHLORIDE 0.9% FLUSH
INTRAVENOUS | Status: AC
  Administered 2017-08-01: 10 mL via INTRAVENOUS
  Filled 2017-08-01: qty 10

## 2017-08-02 ENCOUNTER — Ambulatory Visit (INDEPENDENT_AMBULATORY_CARE_PROVIDER_SITE_OTHER): Payer: Medicare Other

## 2017-08-02 DIAGNOSIS — R002 Palpitations: Secondary | ICD-10-CM | POA: Diagnosis not present

## 2017-08-05 ENCOUNTER — Other Ambulatory Visit: Payer: Self-pay | Admitting: Family Medicine

## 2017-08-14 ENCOUNTER — Other Ambulatory Visit: Payer: Self-pay | Admitting: Family Medicine

## 2017-08-21 ENCOUNTER — Encounter: Payer: Self-pay | Admitting: Cardiovascular Disease

## 2017-08-21 ENCOUNTER — Ambulatory Visit: Payer: Medicare Other | Admitting: Cardiovascular Disease

## 2017-08-21 ENCOUNTER — Other Ambulatory Visit: Payer: Self-pay | Admitting: *Deleted

## 2017-08-21 VITALS — BP 140/80 | HR 83 | Ht 63.0 in | Wt 159.8 lb

## 2017-08-21 DIAGNOSIS — E039 Hypothyroidism, unspecified: Secondary | ICD-10-CM | POA: Diagnosis not present

## 2017-08-21 DIAGNOSIS — G43C1 Periodic headache syndromes in child or adult, intractable: Secondary | ICD-10-CM | POA: Diagnosis not present

## 2017-08-21 DIAGNOSIS — R9439 Abnormal result of other cardiovascular function study: Secondary | ICD-10-CM | POA: Diagnosis not present

## 2017-08-21 DIAGNOSIS — Z8673 Personal history of transient ischemic attack (TIA), and cerebral infarction without residual deficits: Secondary | ICD-10-CM

## 2017-08-21 DIAGNOSIS — R002 Palpitations: Secondary | ICD-10-CM | POA: Diagnosis not present

## 2017-08-21 DIAGNOSIS — I10 Essential (primary) hypertension: Secondary | ICD-10-CM | POA: Diagnosis not present

## 2017-08-21 DIAGNOSIS — R5383 Other fatigue: Secondary | ICD-10-CM

## 2017-08-21 DIAGNOSIS — G890 Central pain syndrome: Secondary | ICD-10-CM | POA: Diagnosis not present

## 2017-08-21 DIAGNOSIS — M542 Cervicalgia: Secondary | ICD-10-CM | POA: Diagnosis not present

## 2017-08-21 MED ORDER — METOPROLOL TARTRATE 25 MG PO TABS: 25.0000 mg | ORAL_TABLET | Freq: Two times a day (BID) | ORAL | 3 refills | Status: DC

## 2017-08-21 MED ORDER — METOPROLOL TARTRATE 25 MG PO TABS: 12.5000 mg | ORAL_TABLET | Freq: Two times a day (BID) | ORAL | 3 refills | Status: DC

## 2017-08-21 NOTE — Patient Instructions (Signed)
Medication Instructions:  Your physician has recommended you make the following change in your medication:  Start Lopressor 12.5 mg Two Times Daily    Labwork: NONE   Testing/Procedures: NONE   Follow-Up: Your physician recommends that you schedule a follow-up appointment in: 3 Months with Dr. Bronson Ing.    Any Other Special Instructions Will Be Listed Below (If Applicable).     If you need a refill on your cardiac medications before your next appointment, please call your pharmacy.  Thank you for choosing [name]!

## 2017-08-21 NOTE — Progress Notes (Signed)
SUBJECTIVE: The patient returns for follow-up after undergoing cardiovascular testing performed for the evaluation of palpitations and fatigue.  Event monitor demonstrated sinus rhythm with isolated PACs and no arrhythmias.  Nuclear stress test demonstrated a small, moderate intensity, reversible apical to basal inferolateral defect consistent with ischemia.  It was a low risk study, LVEF 84%.  She is here with her daughter.  She eats 3 meals a day.  She is able to perform her activities of daily living without difficulty.  She denies chest pain.  She does have palpitations followed by episodic fatigue.  Symptoms last seconds.     Review of Systems: As per "subjective", otherwise negative.  No Known Allergies  Current Outpatient Medications  Medication Sig Dispense Refill  . acetaminophen (TYLENOL) 500 MG tablet Take 1,000 mg by mouth every 8 (eight) hours as needed (cold).    Marland Kitchen amLODipine (NORVASC) 5 MG tablet Take 5 mg by mouth daily.    Marland Kitchen aspirin EC 81 MG tablet Take 2 tablets (162 mg total) by mouth daily.    Marland Kitchen atorvastatin (LIPITOR) 80 MG tablet TAKE (1) TABLET BY MOUTH ONCE A DAY. 90 tablet 0  . Cholecalciferol (VITAMIN D) 2000 units CAPS Take 1,000 Units by mouth daily.    Marland Kitchen gabapentin (NEURONTIN) 100 MG capsule Take 1 in the morning and 2 in the evening (Patient taking differently: Take 100-200 mg by mouth 2 (two) times daily. Take 1 in the morning and 2 in the evening) 90 capsule 3  . levothyroxine (SYNTHROID, LEVOTHROID) 25 MCG tablet TAKE 1 TABLET DAILY BEFORE BREAKFAST. 30 tablet 0  . LINZESS 72 MCG capsule TAKE 1 CAPSULE BY MOUTH DAILY BEFORE BREAKFAST. 30 capsule 0  . RESTASIS 0.05 % ophthalmic emulsion Place 1 drop into both eyes 2 (two) times daily.      No current facility-administered medications for this visit.     Past Medical History:  Diagnosis Date  . Allergy   . Arthritis   . Colon cancer ([place])    colon ca dx 07/30/09  . History of colon cancer  06/2009   found at time of TCS 06/29/09, 1.2cm sessile cecal polyp, no adjuvent therapy needed  . HTN (hypertension)   . Hyperlipidemia   . Hypothyroidism   . Renal disorder    cyst on kidney   . Stroke ([place])   . Vertigo     Past Surgical History:  Procedure Laterality Date  . ABDOMINAL HYSTERECTOMY    . COLON SURGERY  07/2009   right hemicolectomy, no residual colon cancer on path  . COLONOSCOPY  07/16/2010   ATF:TDDUKGURKYHC POLYP-TCS 3 YEARS  . COLONOSCOPY N/A 08/02/2013   Procedure: COLONOSCOPY;  Surgeon: Danie Binder, MD;  Location: [facility];  Service: Endoscopy;  Laterality: N/A;  9:30  . PARTIAL HYSTERECTOMY    . PARTIAL THYMECTOMY    . partial thyroidectomy     benign tumors    Social History   Socioeconomic History  . Marital status: Widowed    Spouse name: Not on file  . Number of children: 2  . Years of education: Not on file  . Highest education level: Not on file  Occupational History  . Occupation: Psychologist, occupational at [employer]  . 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 Financial resource strain: Not on file  . Food insecurity:    Worry: Not on file    Inability: Not on file  . Transportation  needs:    Medical: Not on file    Non-medical: Not on file  Tobacco Use  . Smoking status: Never Smoker  . Smokeless tobacco: Never Used  Substance and Sexual Activity  . Alcohol use: No    Alcohol/week: 0.0 oz  . Drug use: No  . Sexual activity: Not Currently  Lifestyle  . Physical activity:    Days per week: Not on file    Minutes per session: Not on file  . Stress: Not on file  Relationships  . Social connections:    Talks on phone: Not on file    Gets together: Not on file    Attends religious service: Not on file    Active member of club or organization: Not on file    Attends meetings of clubs or organizations: Not on file    Relationship status: Not on file  . Intimate partner violence:    Fear of current or ex  partner: Not on file    Emotionally abused: Not on file    Physically abused: Not on file    Forced sexual activity: Not on file  Other Topics Concern  . Not on file  Social History Narrative  . Not on file     Vitals:   08/21/17 1043  BP: 140/80  Pulse: 83  SpO2: 95%  Weight: 159 lb 12.8 oz (72.5 kg)  Height: 5\' 3"  (1.6 m)    Wt Readings from Last 3 Encounters:  08/21/17 159 lb 12.8 oz (72.5 kg)  07/20/17 159 lb (72.1 kg)  07/19/17 157 lb (71.2 kg)     PHYSICAL EXAM General: NAD HEENT: Normal. Neck: No JVD, no thyromegaly. Lungs: Clear to auscultation bilaterally with normal respiratory effort. CV: Regular rate and rhythm, normal S1/S2, no S3/S4, no murmur. No pretibial or periankle edema.  Abdomen: Soft, nontender, no distention.  Neurologic: Alert and oriented.  Psych: Normal affect. Skin: Normal. Musculoskeletal: No gross deformities.    ECG: Most recent ECG reviewed.   Labs: Lab Results  Component Value Date/Time   K 3.7 07/17/2017 02:36 PM   K 3.9 04/16/2015 08:12 AM   BUN 23 (H) 07/17/2017 02:36 PM   BUN 12.4 04/16/2015 08:12 AM   CREATININE 1.11 (H) 07/17/2017 02:36 PM   CREATININE 1.38 (H) 11/04/2016 08:54 AM   CREATININE 1.2 (H) 04/16/2015 08:12 AM   ALT 19 11/04/2016 08:54 AM   ALT 19 04/16/2015 08:12 AM   TSH 0.47 06/29/2017 09:44 AM   HGB 13.1 07/17/2017 02:36 PM   HGB 14.7 04/16/2015 08:12 AM     Lipids: Lab Results  Component Value Date/Time   LDLCALC 67 11/04/2016 08:54 AM   CHOL 142 11/04/2016 08:54 AM   TRIG 51 11/04/2016 08:54 AM   HDL 65 11/04/2016 08:54 AM       ASSESSMENT AND PLAN:  1.  Palpitations: She has a history of CVA and bilateral lacunar infarcts.    Event monitor did not demonstrate any evidence of atrial fibrillation with isolated PACs as reviewed above.  She is currently on aspirin.  No further cardiac testing is indicated at this time.  I will start metoprolol tartrate 12.5 mg twice daily.  2.    Exertional  fatigue with abnormal stress test: Nuclear stress test reviewed above and suggestive of ischemic heart disease.  It was a low risk study.  She is on aspirin and statin therapy.  I will first attempt to manage medically with the addition of metoprolol tartrate 12.5 mg  twice daily.  3.  History of CVA: Currently on aspirin 162 mg daily and Lipitor 80 mg daily.  4.  Hypothyroidism: TSH levels previously reviewed.  Currently on levothyroxine 25 mcg daily.  5.  Hypertension: Blood pressure is highly elevated on amlodipine 5 mg.    I will monitor given new addition of metoprolol.     Disposition: Follow up 3 months   Kate Sable, M.D., F.A.C.C.

## 2017-08-29 ENCOUNTER — Ambulatory Visit: Payer: Medicare Other | Admitting: Student

## 2017-08-30 ENCOUNTER — Ambulatory Visit: Payer: Medicare Other | Admitting: Orthopaedic Surgery

## 2017-08-30 ENCOUNTER — Encounter: Payer: Self-pay | Admitting: Orthopaedic Surgery

## 2017-08-30 DIAGNOSIS — M25561 Pain in right knee: Secondary | ICD-10-CM

## 2017-08-30 DIAGNOSIS — G8929 Other chronic pain: Secondary | ICD-10-CM | POA: Diagnosis not present

## 2017-08-30 NOTE — Progress Notes (Signed)
CC:  I have pain of my right knee. I would like an injection.  The patient has chronic pain of the right knee.  There is no recent trauma.  There is no redness.  Injections in the past have helped.  The knee has no redness, has an effusion and crepitus present.  ROM of the right knee is 0-105.  Impression:  Chronic knee pain right  Return: prn  PROCEDURE NOTE:  The patient requests injections of the right knee , verbal consent was obtained.  The right knee was prepped appropriately after time out was performed.   Sterile technique was observed and injection of 1 cc of Depo-Medrol 40 mg with several cc's of plain xylocaine. Anesthesia was provided by ethyl chloride and a 20-gauge needle was used to inject the knee area. The injection was tolerated well.  A band aid dressing was applied.  The patient was advised to apply ice later today and tomorrow to the injection sight as needed.  Electronically Signed Sanjuana Kava, MD 6/19/20199:50 AM

## 2017-08-31 ENCOUNTER — Other Ambulatory Visit: Payer: Self-pay | Admitting: Family Medicine

## 2017-09-11 ENCOUNTER — Ambulatory Visit (INDEPENDENT_AMBULATORY_CARE_PROVIDER_SITE_OTHER): Payer: Medicare Other | Admitting: Family Medicine

## 2017-09-11 ENCOUNTER — Other Ambulatory Visit: Payer: Self-pay

## 2017-09-11 ENCOUNTER — Encounter: Payer: Self-pay | Admitting: Family Medicine

## 2017-09-11 VITALS — BP 130/84 | HR 76 | Temp 98.6°F | Resp 12 | Ht 63.0 in | Wt 160.0 lb

## 2017-09-11 DIAGNOSIS — K5901 Slow transit constipation: Secondary | ICD-10-CM

## 2017-09-11 DIAGNOSIS — I10 Essential (primary) hypertension: Secondary | ICD-10-CM

## 2017-09-11 DIAGNOSIS — M19012 Primary osteoarthritis, left shoulder: Secondary | ICD-10-CM | POA: Diagnosis not present

## 2017-09-11 NOTE — Progress Notes (Signed)
   Subjective:    Patient ID: [name], female    DOB: [birth year], 83 y.o.   MRN: [id number]  Patient presents for Follow-up (is not fasting) and L Shoulder Pain (reports increase in pain in joint- radiates down arm)   Left Shoulder pain on and off, today was worse into arm, np specific injury   no falls. No heavy lifting , seen by Dr. Sampson Goon told to exercise arm    Dr. Luna Glasgow- injection in your knees    Seen by cardiology, had monitor showed PAC,given metoprolol 12.5mg    She is very frustrated with her medications that she is spacing them out often taking the medication once an hour until about lunchtime from when she wakes up.  She and her daughter would like me to review this with her to see if things can be taken together.  Review Of Systems:  GEN- denies fatigue, fever, weight loss,weakness, recent illness HEENT- denies eye drainage, change in vision, nasal discharge, CVS- denies chest pain, palpitations RESP- denies SOB, cough, wheeze ABD- denies N/V, change in stools, abd pain GU- denies dysuria, hematuria, dribbling, incontinence MSK- + joint pain, muscle aches, injury Neuro- denies headache, dizziness, syncope, seizure activity       Objective:    BP 130/84   Pulse 76   Temp 98.6 F (37 C) (Oral)   Resp 12   Ht 5\' 3"  (1.6 m)   Wt 160 lb (72.6 kg)   SpO2 97%   BMI 28.34 kg/m  GEN- NAD, alert and oriented x3 HEENT- PERRL, EOMI, non injected sclera, pink conjunctiva, MMM, oropharynx clear Neck- Supple, fair ROM  CVS- RRR, no murmur RESP-CTAB MSK- fair ROM bilat UE/Shoulder, TTP over left AC joint, biceps in tact, rotator cuff grossly in tact, neg empty can  EXT- No edema Pulses- Radial, DP- 2+        Assessment & Plan:      Problem List Items Addressed This Visit      Unprioritized   Constipation    Continue linzess       Essential hypertension - Primary    Well controlled no changes I wrote out a schedule for her medications        Other Visit Diagnoses    Primary osteoarthritis of left shoulder       Most likely OA, she does not want injections in shoulder. Hold on orthopedics. given exercises to do, can use topical rub or tylenol, hold on imaging for now      Note: This dictation was prepared with Dragon dictation along with smaller phrase technology. Any transcriptional errors that result from this process are unintentional.

## 2017-09-11 NOTE — Patient Instructions (Addendum)
Take Synthroid at 8am  Eat at 8:30am Take metoprolol and gabapentin, ASA 81mg  in the morning  Linzess and Norvasc take Before Lunch  Bedtime- metoprolol, gabapentin, lipitor  F/U 5 MONTHS for physical

## 2017-09-12 ENCOUNTER — Encounter: Payer: Self-pay | Admitting: Family Medicine

## 2017-09-12 NOTE — Assessment & Plan Note (Signed)
Well controlled no changes I wrote out a schedule for her medications

## 2017-09-12 NOTE — Assessment & Plan Note (Signed)
Continue linzess 

## 2017-09-21 ENCOUNTER — Ambulatory Visit: Payer: Medicare Other | Admitting: Cardiovascular Disease

## 2017-09-30 ENCOUNTER — Other Ambulatory Visit: Payer: Self-pay | Admitting: Family Medicine

## 2017-10-31 ENCOUNTER — Other Ambulatory Visit: Payer: Self-pay | Admitting: Family Medicine

## 2017-11-07 ENCOUNTER — Ambulatory Visit: Payer: Medicare Other | Admitting: Orthopaedic Surgery

## 2017-11-07 ENCOUNTER — Encounter: Payer: Self-pay | Admitting: Orthopaedic Surgery

## 2017-11-07 VITALS — BP 147/70 | HR 67 | Ht 63.0 in | Wt 162.0 lb

## 2017-11-07 DIAGNOSIS — M25561 Pain in right knee: Secondary | ICD-10-CM | POA: Diagnosis not present

## 2017-11-07 DIAGNOSIS — G8929 Other chronic pain: Secondary | ICD-10-CM | POA: Diagnosis not present

## 2017-11-07 NOTE — Progress Notes (Signed)
,  CC:  I have pain of my right knee. I would like an injection.  The patient has chronic pain of the right knee.  There is no recent trauma.  There is no redness.  Injections in the past have helped.  The knee has no redness, has an effusion and crepitus present.  ROM of the right knee is 0-100.  Impression:  Chronic knee pain right  Return: 1 month  PROCEDURE NOTE:  The patient requests injections of the right knee , verbal consent was obtained.  The right knee was prepped appropriately after time out was performed.   Sterile technique was observed and injection of 1 cc of Depo-Medrol 40 mg with several cc's of plain xylocaine. Anesthesia was provided by ethyl chloride and a 20-gauge needle was used to inject the knee area. The injection was tolerated well.  A band aid dressing was applied.  The patient was advised to apply ice later today and tomorrow to the injection sight as needed.  Electronically Signed Sanjuana Kava, MD 8/27/201910:03 AM

## 2017-11-18 ENCOUNTER — Emergency Department (HOSPITAL_COMMUNITY): Payer: Medicare Other

## 2017-11-18 ENCOUNTER — Encounter (HOSPITAL_COMMUNITY): Payer: Self-pay | Admitting: Emergency Medicine

## 2017-11-18 ENCOUNTER — Emergency Department (HOSPITAL_COMMUNITY)
Admission: EM | Admit: 2017-11-18 | Discharge: 2017-11-18 | Disposition: A | Discharge status: Home / Self Care | Payer: Medicare Other | Attending: Emergency Medicine | Admitting: Emergency Medicine

## 2017-11-18 ENCOUNTER — Other Ambulatory Visit: Payer: Self-pay | Admitting: Family Medicine

## 2017-11-18 DIAGNOSIS — M25512 Pain in left shoulder: Secondary | ICD-10-CM | POA: Insufficient documentation

## 2017-11-18 DIAGNOSIS — Z79899 Other long term (current) drug therapy: Secondary | ICD-10-CM | POA: Insufficient documentation

## 2017-11-18 DIAGNOSIS — M5030 Other cervical disc degeneration, unspecified cervical region: Secondary | ICD-10-CM | POA: Diagnosis not present

## 2017-11-18 DIAGNOSIS — R5383 Other fatigue: Secondary | ICD-10-CM | POA: Diagnosis not present

## 2017-11-18 DIAGNOSIS — E039 Hypothyroidism, unspecified: Secondary | ICD-10-CM | POA: Diagnosis not present

## 2017-11-18 DIAGNOSIS — M5136 Other intervertebral disc degeneration, lumbar region: Secondary | ICD-10-CM | POA: Diagnosis not present

## 2017-11-18 DIAGNOSIS — I129 Hypertensive chronic kidney disease with stage 1 through stage 4 chronic kidney disease, or unspecified chronic kidney disease: Secondary | ICD-10-CM | POA: Diagnosis not present

## 2017-11-18 DIAGNOSIS — D010 Carcinoma in situ of colon: Secondary | ICD-10-CM | POA: Diagnosis not present

## 2017-11-18 DIAGNOSIS — R200 Anesthesia of skin: Secondary | ICD-10-CM | POA: Insufficient documentation

## 2017-11-18 DIAGNOSIS — R202 Paresthesia of skin: Secondary | ICD-10-CM | POA: Diagnosis not present

## 2017-11-18 DIAGNOSIS — R5382 Chronic fatigue, unspecified: Secondary | ICD-10-CM | POA: Insufficient documentation

## 2017-11-18 DIAGNOSIS — G8929 Other chronic pain: Secondary | ICD-10-CM | POA: Insufficient documentation

## 2017-11-18 DIAGNOSIS — N183 Chronic kidney disease, stage 3 (moderate): Secondary | ICD-10-CM | POA: Insufficient documentation

## 2017-11-18 DIAGNOSIS — Z7982 Long term (current) use of aspirin: Secondary | ICD-10-CM | POA: Insufficient documentation

## 2017-11-18 DIAGNOSIS — R079 Chest pain, unspecified: Secondary | ICD-10-CM | POA: Diagnosis not present

## 2017-11-18 DIAGNOSIS — R002 Palpitations: Secondary | ICD-10-CM | POA: Diagnosis not present

## 2017-11-18 DIAGNOSIS — M545 Low back pain: Secondary | ICD-10-CM | POA: Diagnosis not present

## 2017-11-18 HISTORY — DX: Personal history of other medical treatment: Z92.89

## 2017-11-18 LAB — CBC WITH DIFFERENTIAL/PLATELET
BASOS ABS: 0 10*3/uL (ref 0.0–0.1)
Basophils Relative: 1 %
EOS PCT: 3 %
Eosinophils Absolute: 0.1 10*3/uL (ref 0.0–0.7)
HEMATOCRIT: 44.8 % (ref 36.0–46.0)
Hemoglobin: 14.5 g/dL (ref 12.0–15.0)
LYMPHS PCT: 34 %
Lymphs Abs: 1.9 10*3/uL (ref 0.7–4.0)
MCH: 31.5 pg (ref 26.0–34.0)
MCHC: 32.4 g/dL (ref 30.0–36.0)
MCV: 97.4 fL (ref 78.0–100.0)
Monocytes Absolute: 0.4 10*3/uL (ref 0.1–1.0)
Monocytes Relative: 6 %
NEUTROS ABS: 3.2 10*3/uL (ref 1.7–7.7)
Neutrophils Relative %: 56 %
PLATELETS: 326 10*3/uL (ref 150–400)
RBC: 4.6 MIL/uL (ref 3.87–5.11)
RDW: 14.4 % (ref 11.5–15.5)
WBC: 5.6 10*3/uL (ref 4.0–10.5)

## 2017-11-18 LAB — COMPREHENSIVE METABOLIC PANEL
ALT: 21 U/L (ref 0–44)
ANION GAP: 9 (ref 5–15)
AST: 21 U/L (ref 15–41)
Albumin: 4.1 g/dL (ref 3.5–5.0)
Alkaline Phosphatase: 98 U/L (ref 38–126)
BUN: 14 mg/dL (ref 8–23)
CHLORIDE: 104 mmol/L (ref 98–111)
CO2: 29 mmol/L (ref 22–32)
Calcium: 9.5 mg/dL (ref 8.9–10.3)
Creatinine, Ser: 1.13 mg/dL — ABNORMAL HIGH (ref 0.44–1.00)
GFR, EST AFRICAN AMERICAN: 52 mL/min — AB (ref >60)
GFR, EST NON AFRICAN AMERICAN: 45 mL/min — AB (ref >60)
Glucose, Bld: 98 mg/dL (ref 70–99)
POTASSIUM: 3.6 mmol/L (ref 3.5–5.1)
Sodium: 142 mmol/L (ref 135–145)
Total Bilirubin: 0.9 mg/dL (ref 0.3–1.2)
Total Protein: 8.3 g/dL — ABNORMAL HIGH (ref 6.5–8.1)

## 2017-11-18 LAB — URINALYSIS, ROUTINE W REFLEX MICROSCOPIC
BILIRUBIN URINE: NEGATIVE
Bacteria, UA: NONE SEEN
Glucose, UA: NEGATIVE mg/dL
Hgb urine dipstick: NEGATIVE
Ketones, ur: NEGATIVE mg/dL
Nitrite: NEGATIVE
PROTEIN: NEGATIVE mg/dL
SPECIFIC GRAVITY, URINE: 1.003 — AB (ref 1.005–1.030)
pH: 7 (ref 5.0–8.0)

## 2017-11-18 LAB — TROPONIN I

## 2017-11-18 NOTE — ED Notes (Signed)
Pts O2 did not fall below 95% while ambulating

## 2017-11-18 NOTE — ED Provider Notes (Signed)
[facility] EMERGENCY DEPARTMENT Provider Note   CSN: [id number] Arrival date & time: 11/18/17  7829     History   Chief Complaint Chief Complaint  Patient presents with  . Numbness    HPI [name] is a 83 y.o. female.  HPI  Pt was seen at 0900. Per pt and her family, c/o gradual onset and persistence of constant multiple complaints for the past 7 months to 1+ year. Pt c/o continued palpitations over the past 7 months. States she has brief episodes of palpitations followed by fatigue. Pt's symptoms have been unchanged since onset. Denies chest pain or SOB/DOE. Pt has been evaluated by the ED, her PMD and Cards MD for this complaint with stress test and Holter monitor. Pt was started on low dose metoprolol. Pt states her symptoms continue despite the medication. Pt is due to see her Cards MD this Thursday (5 days). Pt also c/o ongoing left shoulder and neck "pain" and "numbness" for the past 1 year. Pt has been evaluated by the ED and her PMD for this complaint and dx "arthritis." Pt now states over the past several weeks to month she has developed constant left lateral lower leg "numbness." Denies numbness extends past her left shoulder. Denies numbness in her upper left leg or medial leg. Denies SOB/cough, no abd pain, no N/V/D, no rash, no fevers, no injury, no neck or back pain, no headache, no visual changes, no focal motor weakness, no ataxia, no slurred speech, no facial droop.     Past Medical History:  Diagnosis Date  . Allergy   . Arthritis   . Colon cancer ([place])    colon ca dx 07/30/09  . History of cardiac monitoring 07/2017   "Event monitor demonstrated sinus rhythm with isolated PACs and no arrhythmias"  . History of colon cancer 06/2009   found at time of TCS 06/29/09, 1.2cm sessile cecal polyp, no adjuvent therapy needed  . HTN (hypertension)   . Hx of cardiovascular stress test 07/2017   "No diagnostic ST segment changes to indicate ischemia. Small,  moderate intensity, reversible apical to basal inferolateral defect consistent with ischemia. This is a low risk study. Nuclear stress EF: 84%."  . Hyperlipidemia   . Hypothyroidism   . Renal disorder    cyst on kidney   . Stroke ([place])   . Vertigo     Patient Active Problem List   Diagnosis Date Noted  . Insomnia 12/15/2015  . Paresthesia of left upper and lower extremity 11/21/2015  . Left leg weakness 11/21/2015  . Paresthesias   . Essential hypertension 11/02/2015  . Hypothyroidism 11/02/2015  . Vertigo 11/02/2015  . CKD (chronic kidney disease), stage III ([place]) 11/02/2015  . Loss of weight 11/02/2015  . OA (osteoarthritis) of knee 11/02/2015  . Colon cancer ([place]) 02/28/2011  . History of colon cancer 06/29/2010  . Constipation 06/10/2009    Past Surgical History:  Procedure Laterality Date  . ABDOMINAL HYSTERECTOMY    . COLON SURGERY  07/2009   right hemicolectomy, no residual colon cancer on path  . COLONOSCOPY  07/16/2010   FAO:ZHYQMVHQIONG POLYP-TCS 3 YEARS  . COLONOSCOPY N/A 08/02/2013   Procedure: COLONOSCOPY;  Surgeon: Danie Binder, MD;  Location: [facility] ENDO SUITE;  Service: Endoscopy;  Laterality: N/A;  9:30  . PARTIAL HYSTERECTOMY    . PARTIAL THYMECTOMY    . partial thyroidectomy     benign tumors     OB History   None  Home Medications    Prior to Admission medications   Medication Sig Start Date End Date Taking? Authorizing Provider  acetaminophen (TYLENOL) 500 MG tablet Take 1,000 mg by mouth every 8 (eight) hours as needed (cold).    [provider]  amLODipine (NORVASC) 5 MG tablet Take 5 mg by mouth daily.    [provider]  aspirin EC 81 MG tablet Take 2 tablets (162 mg total) by mouth daily. 03/04/16   Alycia Rossetti, MD  atorvastatin (LIPITOR) 80 MG tablet TAKE (1) TABLET BY MOUTH ONCE A DAY. 08/15/17   Alycia Rossetti, MD  Cholecalciferol (VITAMIN D) 2000 units CAPS Take 1,000 Units by mouth daily.    [provider]  gabapentin (NEURONTIN) 100 MG capsule Take 1 in the morning and 2 in the evening Patient taking differently: Take 100-200 mg by mouth 2 (two) times daily. Take 1 in the morning and 2 in the evening 05/12/17   [place], Modena Nunnery, MD  levothyroxine (SYNTHROID, LEVOTHROID) 25 MCG tablet TAKE 1 TABLET DAILY BEFORE BREAKFAST. 10/31/17   Alycia Rossetti, MD  LINZESS 72 MCG capsule TAKE 1 CAPSULE BY MOUTH DAILY BEFORE BREAKFAST. 08/08/17   Alycia Rossetti, MD  metoprolol tartrate (LOPRESSOR) 25 MG tablet Take 0.5 tablets (12.5 mg total) by mouth 2 (two) times daily. 08/21/17 11/19/17  Herminio Commons, MD  RESTASIS 0.05 % ophthalmic emulsion Place 1 drop into both eyes 2 (two) times daily.  05/17/10   [provider]    Family History Family History  Problem Relation Age of Onset  . Colon cancer Mother        >age60  . Arthritis Mother   . Cancer Mother   . Heart disease Mother   . Hyperlipidemia Mother   . Hypertension Mother   . Heart attack Father   . Heart disease Father   . Diabetes Maternal Aunt   . Hyperlipidemia Daughter   . Hypertension Daughter   . Liver disease Neg Hx     Social History Social History   Tobacco Use  . Smoking status: Never Smoker  . Smokeless tobacco: Never Used  Substance Use Topics  . Alcohol use: No    Alcohol/week: 0.0 standard drinks  . Drug use: No     Allergies   Patient has no known allergies.   Review of Systems Review of Systems ROS: Statement: All systems negative except as marked or noted in the HPI; Constitutional: Negative for fever and chills. ; ; Eyes: Negative for eye pain, redness and discharge. ; ; ENMT: Negative for ear pain, hoarseness, nasal congestion, sinus pressure and sore throat. ; ; Cardiovascular: +palpitations. Negative for chest pain, diaphoresis, dyspnea and peripheral edema. ; ; Respiratory: Negative for cough, wheezing and stridor. ; ; Gastrointestinal: Negative for nausea, vomiting, diarrhea,  abdominal pain, blood in stool, hematemesis, jaundice and rectal bleeding. . ; ; Genitourinary: Negative for dysuria, flank pain and hematuria. ; ; Musculoskeletal: Negative for back pain and neck pain. Negative for swelling and trauma.; ; Skin: Negative for pruritus, rash, abrasions, blisters, bruising and skin lesion.; ; Neuro: +paresthesias. Negative for headache, lightheadedness and neck stiffness. Negative for weakness, altered level of consciousness, altered mental status, extremity weakness, involuntary movement, seizure and syncope.       Physical Exam Updated Vital Signs BP (!) 163/80 (BP Location: Right Arm)   Pulse 65   Temp 98 F (36.7 C) (Oral)   Resp 16   Ht 5\' 3"  (1.6  m)   Wt 72.6 kg   SpO2 97%   BMI 28.34 kg/m     10:18 Orthostatic Vital Signs LA  Orthostatic Lying   BP- Lying: 150/73   Pulse- Lying: 65       Orthostatic Sitting  BP- Sitting: 146/72   Pulse- Sitting: 72       Orthostatic Standing at 0 minutes  BP- Standing at 0 minutes: 148/82   Pulse- Standing at 0 minutes: 76       Physical Exam 0905: Physical examination:  Nursing notes reviewed; Vital signs and O2 SAT reviewed;  Constitutional: Well developed, Well nourished, Well hydrated, In no acute distress; Head:  Normocephalic, atraumatic; Eyes: EOMI, PERRL, No scleral icterus; ENMT: Mouth and pharynx normal, Mucous membranes moist; Neck: Supple, Full range of motion, No lymphadenopathy; Cardiovascular: Regular rate and rhythm, No gallop; Respiratory: Breath sounds clear & equal bilaterally, No wheezes.  Speaking full sentences with ease, Normal respiratory effort/excursion; Chest: Nontender, Movement normal; Abdomen: Soft, Nontender, Nondistended, Normal bowel sounds; Genitourinary: No CVA tenderness; Spine:  No midline CS, TS, LS tenderness. +TTP left hypertonic trapezius muscle, no rash.;; Extremities: Peripheral pulses normal, No edema, No calf edema or asymmetry.  Left shoulder w/FROM.   Generalized tenderness to palp proximal joint and AC area. Left clavicle NT, scapula NT, proximal humerus NT, biceps tendon NT over bicipital groove.  Motor strength at shoulder normal.  Sensation intact over deltoid region, distal NMS intact with left hand having intact and equal sensation and strength in the distribution of the median, radial, and ulnar nerve function compared to opposite side.  Strong radial pulse.  +FROM left elbow with intact motor strength biceps and triceps muscles to resistance. .; Neuro: AA&Ox3, Major CN grossly intact. Speech clear.  No facial droop.  No nystagmus. Grips equal. Strength 5/5 equal bilat UE's and LE's.  DTR 2/4 equal bilat UE's and LE's. +subjective decreased sensation left lower face, left shoulder, left lateral lower leg knee to foot. Otherwise, no gross sensory deficits.  Normal cerebellar testing bilat UE's (finger-nose) and LE's (heel-shin)..; Skin: Color normal, Warm, Dry.; Psych:  Affect flat. .    ED Treatments / Results  Labs (all labs ordered are listed, but only abnormal results are displayed)   EKG EKG Interpretation  Date/Time:  Saturday November 18 2017 08:54:18 EDT Ventricular Rate:  66 PR Interval:    QRS Duration: 83 QT Interval:  392 QTC Calculation: 411 R Axis:   -23 Text Interpretation:  Sinus rhythm Borderline left axis deviation Low voltage, precordial leads Abnormal R-wave progression, early transition Borderline T wave abnormalities Artifact When compared with ECG of 07/17/2017 No significant change was found Confirmed by Francine Graven [id number]) on 11/18/2017 9:34:45 AM   Radiology   Procedures Procedures (including critical care time)  Medications Ordered in ED Medications - No data to display   Initial Impression / Assessment and Plan / ED Course  I have reviewed the triage vital signs and the nursing notes.  Pertinent labs & imaging results that were available during my care of the patient were reviewed by me and  considered in my medical decision making (see chart for details).  MDM Reviewed: previous chart, nursing note and vitals Reviewed previous: labs and ECG Interpretation: labs, ECG, x-ray and CT scan    Results for orders placed or performed during the hospital encounter of 11/18/17  Comprehensive metabolic panel  Result Value Ref Range   Sodium 142 135 - 145 mmol/L   Potassium 3.6 3.5 -  5.1 mmol/L   Chloride 104 98 - 111 mmol/L   CO2 29 22 - 32 mmol/L   Glucose, Bld 98 70 - 99 mg/dL   BUN 14 8 - 23 mg/dL   Creatinine, Ser 1.13 (H) 0.44 - 1.00 mg/dL   Calcium 9.5 8.9 - 10.3 mg/dL   Total Protein 8.3 (H) 6.5 - 8.1 g/dL   Albumin 4.1 3.5 - 5.0 g/dL   AST 21 15 - 41 U/L   ALT 21 0 - 44 U/L   Alkaline Phosphatase 98 38 - 126 U/L   Total Bilirubin 0.9 0.3 - 1.2 mg/dL   GFR calc non Af Amer 45 (L) >60 mL/min   GFR calc Af Amer 52 (L) >60 mL/min   Anion gap 9 5 - 15  Troponin I  Result Value Ref Range   Troponin I <0.03 <0.03 ng/mL  CBC with Differential  Result Value Ref Range   WBC 5.6 4.0 - 10.5 K/uL   RBC 4.60 3.87 - 5.11 MIL/uL   Hemoglobin 14.5 12.0 - 15.0 g/dL   HCT 44.8 36.0 - 46.0 %   MCV 97.4 78.0 - 100.0 fL   MCH 31.5 26.0 - 34.0 pg   MCHC 32.4 30.0 - 36.0 g/dL   RDW 14.4 11.5 - 15.5 %   Platelets 326 150 - 400 K/uL   Neutrophils Relative % 56 %   Neutro Abs 3.2 1.7 - 7.7 K/uL   Lymphocytes Relative 34 %   Lymphs Abs 1.9 0.7 - 4.0 K/uL   Monocytes Relative 6 %   Monocytes Absolute 0.4 0.1 - 1.0 K/uL   Eosinophils Relative 3 %   Eosinophils Absolute 0.1 0.0 - 0.7 K/uL   Basophils Relative 1 %   Basophils Absolute 0.0 0.0 - 0.1 K/uL  Urinalysis, Routine w reflex microscopic  Result Value Ref Range   Color, Urine COLORLESS (A) YELLOW   APPearance CLEAR CLEAR   Specific Gravity, Urine 1.003 (L) 1.005 - 1.030   pH 7.0 5.0 - 8.0   Glucose, UA NEGATIVE NEGATIVE mg/dL   Hgb urine dipstick NEGATIVE NEGATIVE   Bilirubin Urine NEGATIVE NEGATIVE   Ketones, ur  NEGATIVE NEGATIVE mg/dL   Protein, ur NEGATIVE NEGATIVE mg/dL   Nitrite NEGATIVE NEGATIVE   Leukocytes, UA TRACE (A) NEGATIVE   WBC, UA 0-5 0 - 5 WBC/hpf   Bacteria, UA NONE SEEN NONE SEEN   Dg Chest 1 View Result Date: 11/18/2017 CLINICAL DATA:  Pain and take.  Leg numbness. EXAM: CHEST  1 VIEW COMPARISON:  07/17/2017 FINDINGS: Cardiomediastinal silhouette is normal. Mediastinal contours appear intact. Tortuosity of the aorta. There is no evidence of focal airspace consolidation, pleural effusion or pneumothorax. Osseous structures are without acute abnormality. Soft tissues are grossly normal. IMPRESSION: No active disease. Electronically Signed   By: Fidela Salisbury M.D.   On: 11/18/2017 10:09   Dg Lumbar Spine Complete Result Date: 11/18/2017 CLINICAL DATA:  Pain and fatigue for months.  Left leg numbness. EXAM: LUMBAR SPINE - COMPLETE 4+ VIEW COMPARISON:  Lumbar spine radiographs 06/03/2013 FINDINGS: Five non rib-bearing lumbar type vertebral bodies are present. Facet degenerative changes at L5-S1, left greater than right, are similar the prior exam. Vertebral body heights and alignment are normal. Visualized pelvis is within normal limits. IMPRESSION: 1. Facet degenerative changes at L5-S1 are worse on the left. 2. No acute or significant progressive disease. Electronically Signed   By: San Morelle M.D.   On: 11/18/2017 10:05   Ct Head Wo Contrast  Result Date: 11/18/2017 CLINICAL DATA:  83 year old female with a history of numbness in the left arm and leg EXAM: CT HEAD WITHOUT CONTRAST CT CERVICAL SPINE WITHOUT CONTRAST TECHNIQUE: Multidetector CT imaging of the head and cervical spine was performed following the standard protocol without intravenous contrast. Multiplanar CT image reconstructions of the cervical spine were also generated. COMPARISON:  07/17/2017, 12/01/2016, MR 11/22/2015, CT cervical spine 11/21/2015 FINDINGS: CT HEAD FINDINGS Brain: No acute intracranial hemorrhage.  No midline shift or mass effect. Gray-white differentiation maintained. Patchy hypodensity in the bilateral periventricular white white matter is again demonstrated. Minimal volume loss with unremarkable appearance of the ventricular system. Vascular: Intracranial atherosclerosis Skull: No acute displaced fracture. Sinuses/Orbits: Unremarkable Other: Unremarkable CT CERVICAL SPINE FINDINGS Alignment: Relative straightening of the normal cervical lordosis. 3 mm of anterolisthesis of C4 on C5, unchanged from the comparison. No subluxation. Craniocervical junction is maintained. Facets maintain alignment. Skull base and vertebrae: Unremarkable appearance of the skull base. No acute displaced fracture identified. Vertebral body heights maintained. Soft tissues and spinal canal: No canal hematoma. Nodular enlargement of the left thyroid gland. No adenopathy.  No inflammatory changes. Disc levels: C2-C3: Spurring of the right facet contributes to right foraminal narrowing. C3-C4: Mild uncovertebral joint disease and bilateral facet disease without canal narrowing or foraminal narrowing. C4-C5: Similar appearance of mild anterolisthesis. Right-sided facet disease and uncovertebral joint disease contributes to mild foraminal narrowing. No canal narrowing. C5-C6: No significant canal narrowing. No significant foraminal narrowing. C6-C7: Mild bilateral uncovertebral joint disease contributes to mild foraminal narrowing. C7-T1: No canal narrowing.  No significant facet narrowing. Upper chest: Unremarkable Other: None IMPRESSION: Head CT: No acute intracranial abnormality. Similar appearance of chronic microvascular ischemic disease. Cervical CT: No acute fracture malalignment of the cervical spine. Similar appearance of 2 mm-3 mm anterolisthesis of C4 on C5. Mild disc disease throughout the cervical spine with no significant foraminal narrowing as above Electronically Signed   By: Corrie Mckusick D.O.   On: 11/18/2017 09:59    Ct Cervical Spine Wo Contrast Result Date: 11/18/2017 CLINICAL DATA:  83 year old female with a history of numbness in the left arm and leg EXAM: CT HEAD WITHOUT CONTRAST CT CERVICAL SPINE WITHOUT CONTRAST TECHNIQUE: Multidetector CT imaging of the head and cervical spine was performed following the standard protocol without intravenous contrast. Multiplanar CT image reconstructions of the cervical spine were also generated. COMPARISON:  07/17/2017, 12/01/2016, MR 11/22/2015, CT cervical spine 11/21/2015 FINDINGS: CT HEAD FINDINGS Brain: No acute intracranial hemorrhage. No midline shift or mass effect. Gray-white differentiation maintained. Patchy hypodensity in the bilateral periventricular white white matter is again demonstrated. Minimal volume loss with unremarkable appearance of the ventricular system. Vascular: Intracranial atherosclerosis Skull: No acute displaced fracture. Sinuses/Orbits: Unremarkable Other: Unremarkable CT CERVICAL SPINE FINDINGS Alignment: Relative straightening of the normal cervical lordosis. 3 mm of anterolisthesis of C4 on C5, unchanged from the comparison. No subluxation. Craniocervical junction is maintained. Facets maintain alignment. Skull base and vertebrae: Unremarkable appearance of the skull base. No acute displaced fracture identified. Vertebral body heights maintained. Soft tissues and spinal canal: No canal hematoma. Nodular enlargement of the left thyroid gland. No adenopathy.  No inflammatory changes. Disc levels: C2-C3: Spurring of the right facet contributes to right foraminal narrowing. C3-C4: Mild uncovertebral joint disease and bilateral facet disease without canal narrowing or foraminal narrowing. C4-C5: Similar appearance of mild anterolisthesis. Right-sided facet disease and uncovertebral joint disease contributes to mild foraminal narrowing. No canal narrowing. C5-C6: No significant canal narrowing. No significant foraminal  narrowing. C6-C7: Mild bilateral  uncovertebral joint disease contributes to mild foraminal narrowing. C7-T1: No canal narrowing.  No significant facet narrowing. Upper chest: Unremarkable Other: None IMPRESSION: Head CT: No acute intracranial abnormality. Similar appearance of chronic microvascular ischemic disease. Cervical CT: No acute fracture malalignment of the cervical spine. Similar appearance of 2 mm-3 mm anterolisthesis of C4 on C5. Mild disc disease throughout the cervical spine with no significant foraminal narrowing as above Electronically Signed   By: Corrie Mckusick D.O.   On: 11/18/2017 09:59   Dg Shoulder Left Result Date: 11/18/2017 CLINICAL DATA:  83 year old female with a history of pain and fatigue EXAM: LEFT SHOULDER - 2+ VIEW COMPARISON:  Chest x-ray 05/02/2017 FINDINGS: No acute displaced fracture. Scapular Y-view unremarkable. Degenerative changes of the acromioclavicular joint. Minimal spurring in the subacromial space. Glenohumeral joint appears congruent IMPRESSION: Negative for acute bony abnormality. Degenerative changes of the acromioclavicular joint. Electronically Signed   By: Corrie Mckusick D.O.   On: 11/18/2017 10:03     1200:  Pt not orthostatic on VS. Pt has ambulated with steady gait, easy resps, NAD, Sats 95%+. No arrhythmias noted while in the ED. No clear UTI on UDip; UC pending. DJD/DDD noted on XR/CT, otherwise workup reassuring. No new CVA after pt's constant multiple symptoms over several weeks to month(s). No clear indication for admission at this time. Tx symptomatically at this time. Pt states she has PMD, Cards MD and Ortho MD's to f/u with. Dx and testing d/w pt and family.  Questions answered.  Verb understanding, agreeable to d/c home with outpt f/u.       Final Clinical Impressions(s) / ED Diagnoses   Final diagnoses:  None    ED Discharge Orders    None       Francine Graven, DO 11/21/17 [id number]

## 2017-11-18 NOTE — Discharge Instructions (Signed)
Take over the counter tylenol, as directed on packaging, as needed for discomfort. Take your usual prescriptions as previously directed.  Apply moist heat or ice to the area(s) of discomfort, for 15 minutes at a time, several times per day for the next few days.  Do not fall asleep on a heating or ice pack. Avoid avoid caffinated products, such as teas, colas, coffee, chocolate. Avoid over the counter cold medicines, herbal or "natural vitamin" products, and illicit drugs because they can contain stimulants. Call your regular medical doctor on Monday to schedule a follow up appointment in the next 3 days. Call your Orthopedic doctor on Monday to schedule a follow up appointment within the next week. Call your Cardiologist on Monday to confirm your previously scheduled appointment for this week.  Return to the Emergency Department immediately if worsening.

## 2017-11-18 NOTE — ED Triage Notes (Signed)
Pt reports having some numbness in left arm and leg off and on for "a while" now.  States she was seen by pcp for this and told it was just arthritis.  Pt also c/o intermittent palpitations for several weeks now, worse over the past week.

## 2017-11-20 LAB — URINE CULTURE: CULTURE: NO GROWTH

## 2017-11-23 ENCOUNTER — Encounter: Payer: Self-pay | Admitting: Cardiovascular Disease

## 2017-11-23 ENCOUNTER — Ambulatory Visit: Payer: Medicare Other | Admitting: Cardiovascular Disease

## 2017-11-23 VITALS — BP 138/72 | HR 68 | Ht 63.0 in | Wt 160.0 lb

## 2017-11-23 DIAGNOSIS — I10 Essential (primary) hypertension: Secondary | ICD-10-CM

## 2017-11-23 DIAGNOSIS — R002 Palpitations: Secondary | ICD-10-CM | POA: Diagnosis not present

## 2017-11-23 DIAGNOSIS — Z9289 Personal history of other medical treatment: Secondary | ICD-10-CM

## 2017-11-23 DIAGNOSIS — R5383 Other fatigue: Secondary | ICD-10-CM | POA: Diagnosis not present

## 2017-11-23 DIAGNOSIS — R9439 Abnormal result of other cardiovascular function study: Secondary | ICD-10-CM

## 2017-11-23 DIAGNOSIS — E039 Hypothyroidism, unspecified: Secondary | ICD-10-CM

## 2017-11-23 DIAGNOSIS — Z8673 Personal history of transient ischemic attack (TIA), and cerebral infarction without residual deficits: Secondary | ICD-10-CM | POA: Diagnosis not present

## 2017-11-23 MED ORDER — METOPROLOL TARTRATE 25 MG PO TABS: 25.0000 mg | ORAL_TABLET | Freq: Two times a day (BID) | ORAL | 3 refills | Status: DC

## 2017-11-23 NOTE — Progress Notes (Signed)
SUBJECTIVE: The patient presents for routine follow-up.  She was evaluated for multiple somatic complaints on 11/18/2017 in the ED.  She also had some palpitations accompanied by fatigue.  I personally reviewed the ECG which demonstrated sinus rhythm and nonspecific T wave abnormalities.  There were no arrhythmias.  CBC and troponin were normal.  Creatinine was mildly elevated at 1.13.  Chest x-ray showed no active disease.  She continues to have palpitations which are occasionally associated with sweating.  She feels a sense of generalized weakness after each palpitation.  When she lies down on her right side she has more palpitations.  Systolic blood pressures generally run in the 130-140 range at home.  It was in the 180 range the morning she went to the ED.  It was 163/80 in the ED itself.  She denies chest pain.  She is here with her daughter who is a Marine scientist.  Review of Systems: As per "subjective", otherwise negative.  No Known Allergies  Current Outpatient Medications  Medication Sig Dispense Refill  . acetaminophen (TYLENOL) 500 MG tablet Take 1,000 mg by mouth every 8 (eight) hours as needed (cold).    Marland Kitchen amLODipine (NORVASC) 5 MG tablet Take 5 mg by mouth daily.    Marland Kitchen aspirin EC 81 MG tablet Take 2 tablets (162 mg total) by mouth daily.    Marland Kitchen atorvastatin (LIPITOR) 80 MG tablet TAKE (1) TABLET BY MOUTH ONCE A DAY. 90 tablet 0  . Cholecalciferol (VITAMIN D) 2000 units CAPS Take 1,000 Units by mouth daily.    Marland Kitchen gabapentin (NEURONTIN) 100 MG capsule Take 1 in the morning and 2 in the evening (Patient taking differently: Take 100-200 mg by mouth 2 (two) times daily. Take 1 in the morning and 2 in the evening) 90 capsule 3  . levothyroxine (SYNTHROID, LEVOTHROID) 25 MCG tablet TAKE 1 TABLET DAILY BEFORE BREAKFAST. 30 tablet 0  . LINZESS 72 MCG capsule TAKE 1 CAPSULE BY MOUTH DAILY BEFORE BREAKFAST. 30 capsule 0  . RESTASIS 0.05 % ophthalmic emulsion Place 1 drop into both eyes 2  (two) times daily.     . metoprolol tartrate (LOPRESSOR) 25 MG tablet Take 0.5 tablets (12.5 mg total) by mouth 2 (two) times daily. 90 tablet 3   No current facility-administered medications for this visit.     Past Medical History:  Diagnosis Date  . Allergy   . Arthritis   . Colon cancer ([place])    colon ca dx 07/30/09  . History of cardiac monitoring 07/2017   "Event monitor demonstrated sinus rhythm with isolated PACs and no arrhythmias"  . History of colon cancer 06/2009   found at time of TCS 06/29/09, 1.2cm sessile cecal polyp, no adjuvent therapy needed  . HTN (hypertension)   . Hx of cardiovascular stress test 07/2017   "No diagnostic ST segment changes to indicate ischemia. Small, moderate intensity, reversible apical to basal inferolateral defect consistent with ischemia. This is a low risk study. Nuclear stress EF: 84%."  . Hyperlipidemia   . Hypothyroidism   . Renal disorder    cyst on kidney   . Stroke ([place])   . Vertigo     Past Surgical History:  Procedure Laterality Date  . ABDOMINAL HYSTERECTOMY    . COLON SURGERY  07/2009   right hemicolectomy, no residual colon cancer on path  . COLONOSCOPY  07/16/2010   KGY:[id number] POLYP-TCS 3 YEARS  . COLONOSCOPY N/A 08/02/2013   Procedure: COLONOSCOPY;  Surgeon: Marga Melnick  Fields, MD;  Location: [facility];  Service: Endoscopy;  Laterality: N/A;  9:30  . PARTIAL HYSTERECTOMY    . PARTIAL THYMECTOMY    . partial thyroidectomy     benign tumors    Social History   Socioeconomic History  . Marital status: Widowed    Spouse name: Not on file  . Number of children: 2  . Years of education: Not on file  . Highest education level: Not on file  Occupational History  . Occupation: Psychologist, occupational at [employer]  . 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 Financial resource strain: Not on file  . Food insecurity:    Worry: Not on file    Inability: Not on file  . Transportation needs:    Medical:  Not on file    Non-medical: Not on file  Tobacco Use  . Smoking status: Never Smoker  . Smokeless tobacco: Never Used  Substance and Sexual Activity  . Alcohol use: No    Alcohol/week: 0.0 standard drinks  . Drug use: No  . Sexual activity: Not Currently  Lifestyle  . Physical activity:    Days per week: Not on file    Minutes per session: Not on file  . Stress: Not on file  Relationships  . Social connections:    Talks on phone: Not on file    Gets together: Not on file    Attends religious service: Not on file    Active member of club or organization: Not on file    Attends meetings of clubs or organizations: Not on file    Relationship status: Not on file  . Intimate partner violence:    Fear of current or ex partner: Not on file    Emotionally abused: Not on file    Physically abused: Not on file    Forced sexual activity: Not on file  Other Topics Concern  . Not on file  Social History Narrative  . Not on file     Vitals:   11/23/17 0902  BP: 138/72  Pulse: 68  SpO2: 97%  Weight: 160 lb (72.6 kg)  Height: 5\' 3"  (1.6 m)    Wt Readings from Last 3 Encounters:  11/23/17 160 lb (72.6 kg)  11/18/17 160 lb (72.6 kg)  11/07/17 162 lb (73.5 kg)     PHYSICAL EXAM General: NAD HEENT: Normal. Neck: No JVD, no thyromegaly. Lungs: Clear to auscultation bilaterally with normal respiratory effort. CV: Regular rate and rhythm, normal S1/S2, no S3/S4, no murmur. No pretibial or periankle edema.  Abdomen: Soft, nontender, no distention.  Neurologic: Alert and oriented.  Psych: Normal affect. Skin: Normal. Musculoskeletal: No gross deformities.    ECG: Reviewed above under Subjective   Labs: Lab Results  Component Value Date/Time   K 3.6 11/18/2017 09:56 AM   K 3.9 04/16/2015 08:12 AM   BUN 14 11/18/2017 09:56 AM   BUN 12.4 04/16/2015 08:12 AM   CREATININE 1.13 (H) 11/18/2017 09:56 AM   CREATININE 1.38 (H) 11/04/2016 08:54 AM   CREATININE 1.2 (H)  04/16/2015 08:12 AM   ALT 21 11/18/2017 09:56 AM   ALT 19 04/16/2015 08:12 AM   TSH 0.47 06/29/2017 09:44 AM   HGB 14.5 11/18/2017 09:56 AM   HGB 14.7 04/16/2015 08:12 AM     Lipids: Lab Results  Component Value Date/Time   LDLCALC 67 11/04/2016 08:54 AM   CHOL 142 11/04/2016 08:54 AM   TRIG 51 11/04/2016 08:54  AM   HDL 65 11/04/2016 08:54 AM       ASSESSMENT AND PLAN:  1. Palpitations: She continues to experience palpitations in spite the institution of low-dose metoprolol.  I will increase the dose to 25 mg twice daily.  Event monitor did not demonstrate any evidence of atrial fibrillation and showed isolated PACs. She is currently on aspirin.  No further cardiac testing is indicated at this time.   2.   Exertional fatigue with abnormal stress test: Nuclear stress test previously reviewed and suggestive of ischemic heart disease.  It was a low risk study.  She is on aspirin and statin therapy.  I am increasing metoprolol to 25 mg twice daily.  3. History of CVA: Currently on aspirin 162 mg daily and Lipitor 80 mg daily.  4. Hypothyroidism: TSH levels previously reviewed. Currently on levothyroxine 25 mcg daily.  5. Hypertension: Blood pressure is  normal today.  I will monitor given the increased dose of metoprolol.   Disposition: Follow up 3 months   Kate Sable, M.D., F.A.C.C.

## 2017-11-23 NOTE — Patient Instructions (Addendum)
Your physician wants you to follow-up in: 3 months  INCREASE Metoprolol to 25 mg twice a day, call us in a couple weeks and let us know how you feel.     If you need a refill on your cardiac medications before your next appointment, please call your pharmacy.     No labs or tests today.      Thank you for choosing [facility] !

## 2017-12-01 ENCOUNTER — Other Ambulatory Visit: Payer: Self-pay | Admitting: *Deleted

## 2017-12-01 MED ORDER — LEVOTHYROXINE SODIUM 25 MCG PO TABS: 25.0000 ug | ORAL_TABLET | Freq: Every day | ORAL | 11 refills | Status: DC

## 2017-12-05 ENCOUNTER — Encounter: Payer: Self-pay | Admitting: Orthopaedic Surgery

## 2017-12-05 ENCOUNTER — Ambulatory Visit: Payer: Medicare Other | Admitting: Orthopaedic Surgery

## 2017-12-05 DIAGNOSIS — G8929 Other chronic pain: Secondary | ICD-10-CM

## 2017-12-05 DIAGNOSIS — M25561 Pain in right knee: Secondary | ICD-10-CM

## 2017-12-05 NOTE — Progress Notes (Signed)
CC:  I have pain of my right knee. I would like an injection.  The patient has chronic pain of the right knee.  There is no recent trauma.  There is no redness.  Injections in the past have helped.  The knee has no redness, has an effusion and crepitus present.  ROM of the right knee is 0-105.  Impression:  Chronic knee pain right  Return: 1 month  PROCEDURE NOTE:  The patient requests injections of the right knee , verbal consent was obtained.  The right knee was prepped appropriately after time out was performed.   Sterile technique was observed and injection of 1 cc of Depo-Medrol 40 mg with several cc's of plain xylocaine. Anesthesia was provided by ethyl chloride and a 20-gauge needle was used to inject the knee area. The injection was tolerated well.  A band aid dressing was applied.  The patient was advised to apply ice later today and tomorrow to the injection sight as needed.  I have reviewed the [facility] web site prior to prescribing narcotic medicine for this patient.

## 2017-12-14 ENCOUNTER — Telehealth: Payer: Self-pay | Admitting: Cardiovascular Disease

## 2017-12-14 NOTE — Telephone Encounter (Signed)
Since medication increase/ change pt states "it's getting better and she has not been as week"  Pt can be reached @ [phone] at the front desk of the hospital till 12:30 then her home # is [phone]

## 2017-12-14 NOTE — Telephone Encounter (Signed)
Will forward to Dr. Koneswaran as an FYI.  

## 2017-12-27 DIAGNOSIS — D509 Iron deficiency anemia, unspecified: Secondary | ICD-10-CM | POA: Diagnosis not present

## 2017-12-27 DIAGNOSIS — E559 Vitamin D deficiency, unspecified: Secondary | ICD-10-CM | POA: Diagnosis not present

## 2017-12-27 DIAGNOSIS — R809 Proteinuria, unspecified: Secondary | ICD-10-CM | POA: Diagnosis not present

## 2017-12-27 DIAGNOSIS — N183 Chronic kidney disease, stage 3 (moderate): Secondary | ICD-10-CM | POA: Diagnosis not present

## 2017-12-27 DIAGNOSIS — Z79899 Other long term (current) drug therapy: Secondary | ICD-10-CM | POA: Diagnosis not present

## 2018-01-02 DIAGNOSIS — I10 Essential (primary) hypertension: Secondary | ICD-10-CM | POA: Diagnosis not present

## 2018-01-02 DIAGNOSIS — N183 Chronic kidney disease, stage 3 (moderate): Secondary | ICD-10-CM | POA: Diagnosis not present

## 2018-01-02 DIAGNOSIS — E870 Hyperosmolality and hypernatremia: Secondary | ICD-10-CM | POA: Diagnosis not present

## 2018-01-09 ENCOUNTER — Ambulatory Visit: Payer: Medicare Other | Admitting: Orthopaedic Surgery

## 2018-01-30 ENCOUNTER — Other Ambulatory Visit: Payer: Self-pay | Admitting: Family Medicine

## 2018-01-30 DIAGNOSIS — Z1231 Encounter for screening mammogram for malignant neoplasm of breast: Secondary | ICD-10-CM

## 2018-02-15 ENCOUNTER — Ambulatory Visit (HOSPITAL_COMMUNITY): Payer: Medicare Other

## 2018-02-15 ENCOUNTER — Ambulatory Visit (HOSPITAL_COMMUNITY)
Admission: RE | Admit: 2018-02-15 | Discharge: 2018-02-15 | Disposition: A | Discharge status: Home / Self Care | Payer: Medicare Other | Source: Ambulatory Visit | Attending: Family Medicine | Admitting: Family Medicine

## 2018-02-15 DIAGNOSIS — Z1231 Encounter for screening mammogram for malignant neoplasm of breast: Secondary | ICD-10-CM

## 2018-02-20 DIAGNOSIS — G4701 Insomnia due to medical condition: Secondary | ICD-10-CM | POA: Diagnosis not present

## 2018-02-20 DIAGNOSIS — M542 Cervicalgia: Secondary | ICD-10-CM | POA: Diagnosis not present

## 2018-02-20 DIAGNOSIS — G890 Central pain syndrome: Secondary | ICD-10-CM | POA: Diagnosis not present

## 2018-02-20 DIAGNOSIS — R5383 Other fatigue: Secondary | ICD-10-CM | POA: Diagnosis not present

## 2018-02-22 ENCOUNTER — Ambulatory Visit: Payer: Medicare Other | Admitting: Student

## 2018-02-23 ENCOUNTER — Encounter: Payer: Self-pay | Admitting: Student

## 2018-02-23 ENCOUNTER — Ambulatory Visit: Payer: Medicare Other | Admitting: Student

## 2018-02-23 VITALS — BP 122/64 | HR 69 | Ht 64.0 in | Wt 164.0 lb

## 2018-02-23 DIAGNOSIS — Z8673 Personal history of transient ischemic attack (TIA), and cerebral infarction without residual deficits: Secondary | ICD-10-CM

## 2018-02-23 DIAGNOSIS — I10 Essential (primary) hypertension: Secondary | ICD-10-CM | POA: Diagnosis not present

## 2018-02-23 DIAGNOSIS — R002 Palpitations: Secondary | ICD-10-CM | POA: Diagnosis not present

## 2018-02-23 MED ORDER — METOPROLOL TARTRATE 25 MG PO TABS: 25.0000 mg | ORAL_TABLET | Freq: Two times a day (BID) | ORAL | 3 refills | Status: DC

## 2018-02-23 NOTE — Patient Instructions (Signed)
Medication Instructions:  Your physician recommends that you continue on your current medications as directed. Please refer to the Current Medication list given to you today.  If you need a refill on your cardiac medications before your next appointment, please call your pharmacy.   Lab work: NONE   If you have labs (blood work) drawn today and your tests are completely normal, you will receive your results only by: . MyChart Message (if you have MyChart) OR . A paper copy in the mail If you have any lab test that is abnormal or we need to change your treatment, we will call you to review the results.  Testing/Procedures: NONE   Follow-Up: At [facility], you and your health needs are our priority.  As part of our continuing mission to provide you with exceptional heart care, we have created designated Provider Care Teams.  These Care Teams include your primary Cardiologist (physician) and Advanced Practice Providers (APPs -  Physician Assistants and Nurse Practitioners) who all work together to provide you with the care you need, when you need it. You will need a follow up appointment in 6 months.  Please call our office 2 months in advance to schedule this appointment.  You may see Suresh Koneswaran, MD or one of the following Advanced Practice Providers on your designated Care Team:   Brittany Strader, PA-C ([place] Office) . Michele Lenze, PA-C ([place] Office)  Any Other Special Instructions Will Be Listed Below (If Applicable). Thank you for choosing [facility]!     

## 2018-02-23 NOTE — Progress Notes (Signed)
Cardiology Office Note    Date:  02/23/2018   ID:  [name], DOB 1934-09-16, MRN [id number]  PCP:  Alycia Rossetti, MD  Cardiologist: Kate Sable, MD    Chief Complaint  Patient presents with  . Follow-up    3 month visit    History of Present Illness:    [name] is a 83 y.o. female with past medical history of HTN, hypothyroidism, palpitations (PAC's by prior monitor in 07/2017), and prior CVA who presents to the office today for 16-month follow-up.  She was last examined by Dr. Bronson Ing in 11/2017 and reported occasional palpitations with associated weakness. She denied any chest pain or dyspnea at that time. Prior event monitoring had demonstrated sinus rhythm with PAC's and no significant arrhythmias, therefore Lopressor was further titrated to 25 mg twice daily.  In talking with the patient and her daughter today, she reports much improvement in her symptoms following titration of Lopressor at the time of her last office visit. She does note occasional palpitations in the morning hours but reports these only last for a few seconds at a time and spontaneously resolve. She does feel weak when this occurs but denies any associated chest pain, dyspnea, dizziness, or presyncope. Denies any recent dyspnea on exertion, orthopnea, PND, or lower extremity edema.  She does check her blood pressure occasionally at home and says that it has overall been well controlled. BP is at 122/64 during today's visit.  Past Medical History:  Diagnosis Date  . Allergy   . Arthritis   . Colon cancer ([place])    colon ca dx 07/30/09  . History of cardiac monitoring 07/2017   "Event monitor demonstrated sinus rhythm with isolated PACs and no arrhythmias"  . History of colon cancer 06/2009   found at time of TCS 06/29/09, 1.2cm sessile cecal polyp, no adjuvent therapy needed  . HTN (hypertension)   . Hx of cardiovascular stress test 07/2017   "No diagnostic ST segment  changes to indicate ischemia. Small, moderate intensity, reversible apical to basal inferolateral defect consistent with ischemia. This is a low risk study. Nuclear stress EF: 84%."  . Hyperlipidemia   . Hypothyroidism   . Renal disorder    cyst on kidney   . Stroke ([place])   . Vertigo     Past Surgical History:  Procedure Laterality Date  . ABDOMINAL HYSTERECTOMY    . COLON SURGERY  07/2009   right hemicolectomy, no residual colon cancer on path  . COLONOSCOPY  07/16/2010   VVO:[id number] POLYP-TCS 3 YEARS  . COLONOSCOPY N/A 08/02/2013   Procedure: COLONOSCOPY;  Surgeon: Danie Binder, MD;  Location: [facility];  Service: Endoscopy;  Laterality: N/A;  9:30  . PARTIAL HYSTERECTOMY    . PARTIAL THYMECTOMY    . partial thyroidectomy     benign tumors    Current Medications: Outpatient Medications Prior to Visit  Medication Sig Dispense Refill  . acetaminophen (TYLENOL) 500 MG tablet Take 1,000 mg by mouth every 8 (eight) hours as needed (cold).    Marland Kitchen amLODipine (NORVASC) 5 MG tablet Take 5 mg by mouth daily.    Marland Kitchen aspirin EC 81 MG tablet Take 2 tablets (162 mg total) by mouth daily.    Marland Kitchen atorvastatin (LIPITOR) 80 MG tablet TAKE (1) TABLET BY MOUTH ONCE A DAY. 90 tablet 0  . Cholecalciferol (VITAMIN D) 2000 units CAPS Take 1,000 Units by mouth daily.    Marland Kitchen gabapentin (NEURONTIN) 100 MG capsule  Take 1 in the morning and 2 in the evening (Patient taking differently: Take 100-200 mg by mouth 2 (two) times daily. Take 1 in the morning and 2 in the evening) 90 capsule 3  . levothyroxine (SYNTHROID, LEVOTHROID) 25 MCG tablet Take 1 tablet (25 mcg total) by mouth daily before breakfast. 30 tablet 11  . LINZESS 72 MCG capsule TAKE 1 CAPSULE BY MOUTH DAILY BEFORE BREAKFAST. 30 capsule 0  . RESTASIS 0.05 % ophthalmic emulsion Place 1 drop into both eyes 2 (two) times daily.     Marland Kitchen amLODipine (NORVASC) 10 MG tablet     . metoprolol tartrate (LOPRESSOR) 25 MG tablet Take 1 tablet (25 mg total) by  mouth 2 (two) times daily. 90 tablet 3   No facility-administered medications prior to visit.      Allergies:   Patient has no known allergies.   Social History   Socioeconomic History  . Marital status: Widowed    Spouse name: Not on file  . Number of children: 2  . Years of education: Not on file  . Highest education level: Not on file  Occupational History  . Occupation: Psychologist, occupational at [employer]  . 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 Financial resource strain: Not on file  . Food insecurity:    Worry: Not on file    Inability: Not on file  . Transportation needs:    Medical: Not on file    Non-medical: Not on file  Tobacco Use  . Smoking status: Never Smoker  . Smokeless tobacco: Never Used  Substance and Sexual Activity  . Alcohol use: No    Alcohol/week: 0.0 standard drinks  . Drug use: No  . Sexual activity: Not Currently  Lifestyle  . Physical activity:    Days per week: Not on file    Minutes per session: Not on file  . Stress: Not on file  Relationships  . Social connections:    Talks on phone: Not on file    Gets together: Not on file    Attends religious service: Not on file    Active member of club or organization: Not on file    Attends meetings of clubs or organizations: Not on file    Relationship status: Not on file  Other Topics Concern  . Not on file  Social History Narrative  . Not on file     Family History:  The patient's family history includes Arthritis in her mother; Cancer in her mother; Colon cancer in her mother; Diabetes in her maternal aunt; Heart attack in her father; Heart disease in her father and mother; Hyperlipidemia in her daughter and mother; Hypertension in her daughter and mother.   Review of Systems:   Please see the history of present illness.     General:  No chills, fever, night sweats or weight changes.  Cardiovascular:  No chest pain, dyspnea on exertion, edema, orthopnea,  paroxysmal  nocturnal dyspnea. Positive for palpitations.  Dermatological: No rash, lesions/masses Respiratory: No cough, dyspnea Urologic: No hematuria, dysuria Abdominal:   No nausea, vomiting, diarrhea, bright red blood per rectum, melena, or hematemesis Neurologic:  No visual changes, wkns, changes in mental status.  All other systems reviewed and are otherwise negative except as noted above.   Physical Exam:    VS:  BP 122/64 (BP Location: Right Arm)   Pulse 69   Ht 5\' 4"  (1.626 m)   Wt 164 lb (74.4 kg)  SpO2 96%   BMI 28.15 kg/m    General: Well developed, elderly African American female appearing in no acute distress. Head: Normocephalic, atraumatic, sclera non-icteric, no xanthomas, nares are without discharge.  Neck: No carotid bruits. JVD not elevated.  Lungs: Respirations regular and unlabored, without wheezes or rales.  Heart: Regular rate and rhythm. No S3 or S4.  No murmur, no rubs, or gallops appreciated. Abdomen: Soft, non-tender, non-distended with normoactive bowel sounds. No hepatomegaly. No rebound/guarding. No obvious abdominal masses. Msk:  Strength and tone appear normal for age. No joint deformities or effusions. Extremities: No clubbing or cyanosis. No lower extremity edema.  Distal pedal pulses are 2+ bilaterally. Neuro: Alert and oriented X 3. Moves all extremities spontaneously. No focal deficits noted. Psych:  Responds to questions appropriately with a normal affect. Skin: No rashes or lesions noted  Wt Readings from Last 3 Encounters:  02/23/18 164 lb (74.4 kg)  11/23/17 160 lb (72.6 kg)  11/18/17 160 lb (72.6 kg)     Studies/Labs Reviewed:   EKG:  EKG is not ordered today.   Recent Labs: 06/29/2017: TSH 0.47 11/18/2017: ALT 21; BUN 14; Creatinine, Ser 1.13; Hemoglobin 14.5; Platelets 326; Potassium 3.6; Sodium 142   Lipid Panel    Component Value Date/Time   CHOL 142 11/04/2016 0854   TRIG 51 11/04/2016 0854   HDL 65 11/04/2016 0854   CHOLHDL 2.2  11/04/2016 0854   VLDL 10 11/04/2016 0854   LDLCALC 67 11/04/2016 0854    Additional studies/ records that were reviewed today include:   NST: 07/2017  No diagnostic ST segment changes to indicate ischemia.  Small, moderate intensity, reversible apical to basal inferolateral defect consistent with ischemia.  This is a low risk study.  Nuclear stress EF: 84%.   Event Monitor: 07/2017  Sinus rhythm with isolated PACs. No arrhythmias.  Assessment:    1. Palpitations   2. Essential hypertension   3. History of CVA (cerebrovascular accident)      Plan:   In order of problems listed above:  1. Palpitations - She reports having occasional palpitations but notes her symptoms have significantly improved since her last office visit and the titration of Lopressor. Symptoms typically only last for a few seconds and spontaneously resolve. She denies any caffeine or alcohol intake. Does have hypothyroidism which is followed by her PCP.  - Will continue on Lopressor 25 mg twice daily at this time. We reviewed that she can take an extra tablet if needed for palpitations. If she finds that she is having to do this regularly, would plan to further titrate Lopressor and may need to decrease Amlodipine pending BP response.  2. HTN - BP is well controlled at 122/64 during today's visit. Continue current medication regimen with Amlodipine 5 mg daily and Lopressor 25 mg twice daily.  3. History of CVA - followed by Neurology. She remains on ASA 162mg  daily. Prior monitor showed no significant arrhythmias as outlined above.    Medication Adjustments/Labs and Tests Ordered: Current medicines are reviewed at length with the patient today.  Concerns regarding medicines are outlined above.  Medication changes, Labs and Tests ordered today are listed in the Patient Instructions below. Patient Instructions  Medication Instructions:  Your physician recommends that you continue on your current  medications as directed. Please refer to the Current Medication list given to you today.  If you need a refill on your cardiac medications before your next appointment, please call your pharmacy.   Lab work:  NONE  If you have labs (blood work) drawn today and your tests are completely normal, you will receive your results only by: Marland Kitchen MyChart Message (if you have MyChart) OR . A paper copy in the mail If you have any lab test that is abnormal or we need to change your treatment, we will call you to review the results.  Testing/Procedures: NONE   Follow-Up: At [facility], you and your health needs are our priority.  As part of our continuing mission to provide you with exceptional heart care, we have created designated Provider Care Teams.  These Care Teams include your primary Cardiologist (physician) and Advanced Practice Providers (APPs -  Physician Assistants and Nurse Practitioners) who all work together to provide you with the care you need, when you need it. You will need a follow up appointment in 6 months.  Please call our office 2 months in advance to schedule this appointment.  You may see Kate Sable, MD or one of the following Advanced Practice Providers on your designated Care Team:   Bernerd Pho, PA-C [facility]) . Ermalinda Barrios, PA-C ([place])  Any Other Special Instructions Will Be Listed Below (If Applicable). Thank you for choosing [facility]!    Signed, Erma Heritage, PA-C  02/23/2018 4:07 PM    [place] [address] [place], [place] [address] Phone: [phone]

## 2018-02-24 ENCOUNTER — Other Ambulatory Visit: Payer: Self-pay | Admitting: Family Medicine

## 2018-02-28 ENCOUNTER — Ambulatory Visit (INDEPENDENT_AMBULATORY_CARE_PROVIDER_SITE_OTHER): Payer: Medicare Other | Admitting: Family Medicine

## 2018-02-28 ENCOUNTER — Encounter: Payer: Self-pay | Admitting: Family Medicine

## 2018-02-28 ENCOUNTER — Other Ambulatory Visit: Payer: Self-pay

## 2018-02-28 VITALS — BP 124/68 | HR 62 | Temp 99.1°F | Resp 14 | Ht 64.0 in | Wt 163.0 lb

## 2018-02-28 DIAGNOSIS — M85851 Other specified disorders of bone density and structure, right thigh: Secondary | ICD-10-CM

## 2018-02-28 DIAGNOSIS — I10 Essential (primary) hypertension: Secondary | ICD-10-CM

## 2018-02-28 DIAGNOSIS — Z0000 Encounter for general adult medical examination without abnormal findings: Secondary | ICD-10-CM | POA: Diagnosis not present

## 2018-02-28 DIAGNOSIS — E039 Hypothyroidism, unspecified: Secondary | ICD-10-CM | POA: Diagnosis not present

## 2018-02-28 DIAGNOSIS — M85852 Other specified disorders of bone density and structure, left thigh: Secondary | ICD-10-CM

## 2018-02-28 DIAGNOSIS — N183 Chronic kidney disease, stage 3 unspecified: Secondary | ICD-10-CM

## 2018-02-28 DIAGNOSIS — Z85038 Personal history of other malignant neoplasm of large intestine: Secondary | ICD-10-CM

## 2018-02-28 MED ORDER — AMLODIPINE BESYLATE 5 MG PO TABS: 5.0000 mg | ORAL_TABLET | Freq: Every day | ORAL | 2 refills | Status: DC

## 2018-02-28 MED ORDER — GABAPENTIN 100 MG PO CAPS: ORAL_CAPSULE | ORAL | 3 refills | Status: DC

## 2018-02-28 MED ORDER — ATORVASTATIN CALCIUM 80 MG PO TABS: ORAL_TABLET | ORAL | 2 refills | Status: DC

## 2018-02-28 MED ORDER — LEVOTHYROXINE SODIUM 25 MCG PO TABS: 25.0000 ug | ORAL_TABLET | Freq: Every day | ORAL | 11 refills | Status: DC

## 2018-02-28 MED ORDER — LINACLOTIDE 72 MCG PO CAPS: ORAL_CAPSULE | ORAL | 1 refills | Status: DC

## 2018-02-28 NOTE — Progress Notes (Signed)
Subjective:   Patient presents for Medicare Annual/Subsequent preventive examination.  She continues to have difficulty with her right knee.  She missed her last appointment with orthopedics.  They gave her an injection back in September.  She does state that her knee gives out on her.  She does not have a knee brace.  She has not had any further palpitations.  States that her blood pressure and heart rate has been fairly stable.  No changes made by her neurologist she is still on gabapentin. Review Past Medical/Family/Social: per EMR    Risk Factors  Current exercise habits: some walking Dietary issues discussed: Yes  Cardiac risk factors: HTN , CKD  Depression Screen  (Note: if answer to either of the following is "Yes", a more complete depression screening is indicated)  Over the past two weeks, have you felt down, depressed or hopeless? No Over the past two weeks, have you felt little interest or pleasure in doing things? No Have you lost interest or pleasure in daily life? No Do you often feel hopeless? No Do you cry easily over simple problems? No   Activities of Daily Living  In your present state of health, do you have any difficulty performing the following activities?:  Driving? No  Managing money? No  Feeding yourself? No  Getting from bed to chair? No  Climbing a flight of stairs? No  Preparing food and eating?: No  Bathing or showering? No  Getting dressed: No  Getting to the toilet? No  Using the toilet:No  Moving around from place to place: No  In the past year have you fallen or had a near fall?:No  Are you sexually active? No  Do you have more than one partner? No   Hearing Difficulties: No  Do you often ask people to speak up or repeat themselves? No  Do you experience ringing or noises in your ears? No Do you have difficulty understanding soft or whispered voices? No  Do you feel that you have a problem with memory? No Do you often misplace items? No   Do you feel safe at home? Yes  Cognitive Testing  Alert? Yes Normal Appearance?Yes  Oriented to person? Yes Place? Yes  Time? Yes  Recall of three objects? Yes  Can perform simple calculations? Yes  Displays appropriate judgment?Yes  Can read the correct time from a watch face?Yes   List the Names of Other Physician/Practitioners you currently use:  Cardiology  Orthopedics- Dr. Luna Glasgow Neurology- Dr. Merlene Laughter    Screening Tests / Date Colonoscopy   2015             Zostavax  UTD Pneumonia- UTD Mammogram UTD Influenza Vaccine  UTD Tetanus/tdap - declines for now  Bone Density- Due  ROS: ,GEN- denies fatigue, fever, weight loss,weakness, recent illness HEENT- denies eye drainage, change in vision, nasal discharge, CVS- denies chest pain, palpitations RESP- denies SOB, cough, wheeze ABD- denies N/V, change in stools, abd pain GU- denies dysuria, hematuria, dribbling, incontinence MSK- + joint pain, muscle aches, injury Neuro- denies headache, dizziness, syncope, seizure activity  Physical: Vitals reviewed  GEN- NAD, alert and oriented x3 HEENT- PERRL, EOMI, non injected sclera, pink conjunctiva, MMM, oropharynx clear Neck- Supple, no thryomegaly CVS- RRR, no murmur RESP-CTAB ABD-NABS,soft,NT,ND MSK- Fair ROM no effusion, +crepitus bilat knees  EXT- No edema Pulses- Radial, DP- 2+    Assessment:    Annual wellness medicare exam   Plan:    During the course of the visit  the patient was educated and counseled about appropriate screening and preventive services including:   FALL/DEPRESSION/AUDIT C NEGATIVE  Hypothyroidism- continue synthroid  Ostepenia- due for repeat BONE DENSITY, calcium vitamin D per nephrology   She history of colon cancer as well as multiple problems.  She is due for recall colonoscopy in the summer 2020  Osteoarthritis recommend that she get a with her orthopedic see if they recommend a knee brace for her.   Advanced directives-  full code    Diet review for nutrition referral? Yes ____ Not Indicated __x__  Patient Instructions (the written plan) was given to the patient.  Medicare Attestation  I have personally reviewed:  The patient's medical and social history  Their use of alcohol, tobacco or illicit drugs  Their current medications and supplements  The patient's functional ability including ADLs,fall risks, home safety risks, cognitive, and hearing and visual impairment  Diet and physical activities  Evidence for depression or mood disorders  The patient's weight, height, BMI, and visual acuity have been recorded in the chart. I have made referrals, counseling, and provided education to the patient based on review of the above and I have provided the patient with a written personalized care plan for preventive services.

## 2018-02-28 NOTE — Patient Instructions (Addendum)
Call Dr. Luna Glasgow for appointment for shot and possible Knee brace  Schedule your Bone Density  F/U 6 months

## 2018-03-01 LAB — CBC WITH DIFFERENTIAL/PLATELET
Absolute Monocytes: 489 cells/uL (ref 200–950)
BASOS ABS: 52 {cells}/uL (ref 0–200)
Basophils Relative: 1.1 %
EOS ABS: 188 {cells}/uL (ref 15–500)
Eosinophils Relative: 4 %
HCT: 40.8 % (ref 35.0–45.0)
HEMOGLOBIN: 13.3 g/dL (ref 11.7–15.5)
Lymphs Abs: 1532 cells/uL (ref 850–3900)
MCH: 31.4 pg (ref 27.0–33.0)
MCHC: 32.6 g/dL (ref 32.0–36.0)
MCV: 96.5 fL (ref 80.0–100.0)
MPV: 9.8 fL (ref 7.5–12.5)
Monocytes Relative: 10.4 %
Neutro Abs: 2439 cells/uL (ref 1500–7800)
Neutrophils Relative %: 51.9 %
Platelets: 333 10*3/uL (ref 140–400)
RBC: 4.23 10*6/uL (ref 3.80–5.10)
RDW: 13.8 % (ref 11.0–15.0)
Total Lymphocyte: 32.6 %
WBC: 4.7 10*3/uL (ref 3.8–10.8)

## 2018-03-01 LAB — COMPREHENSIVE METABOLIC PANEL
AG Ratio: 1.3 (calc) (ref 1.0–2.5)
ALT: 15 U/L (ref 6–29)
AST: 19 U/L (ref 10–35)
Albumin: 4 g/dL (ref 3.6–5.1)
Alkaline phosphatase (APISO): 97 U/L (ref 33–130)
BUN/Creatinine Ratio: 12 (calc) (ref 6–22)
BUN: 17 mg/dL (ref 7–25)
CO2: 26 mmol/L (ref 20–32)
Calcium: 9.5 mg/dL (ref 8.6–10.4)
Chloride: 106 mmol/L (ref 98–110)
Creat: 1.38 mg/dL — ABNORMAL HIGH (ref 0.60–0.88)
Globulin: 3.2 g/dL (calc) (ref 1.9–3.7)
Glucose, Bld: 95 mg/dL (ref 65–99)
Potassium: 4.3 mmol/L (ref 3.5–5.3)
Sodium: 143 mmol/L (ref 135–146)
Total Bilirubin: 0.6 mg/dL (ref 0.2–1.2)
Total Protein: 7.2 g/dL (ref 6.1–8.1)

## 2018-03-01 LAB — LIPID PANEL
CHOLESTEROL: 131 mg/dL (ref <200)
HDL: 48 mg/dL — ABNORMAL LOW (ref >50)
LDL Cholesterol (Calc): 67 mg/dL (calc)
Non-HDL Cholesterol (Calc): 83 mg/dL (calc) (ref <130)
Total CHOL/HDL Ratio: 2.7 (calc) (ref <5.0)
Triglycerides: 79 mg/dL (ref <150)

## 2018-03-01 LAB — TSH: TSH: 0.29 mIU/L — ABNORMAL LOW (ref 0.40–4.50)

## 2018-03-05 ENCOUNTER — Other Ambulatory Visit: Payer: Self-pay | Admitting: *Deleted

## 2018-03-05 DIAGNOSIS — E039 Hypothyroidism, unspecified: Secondary | ICD-10-CM

## 2018-03-13 ENCOUNTER — Ambulatory Visit (INDEPENDENT_AMBULATORY_CARE_PROVIDER_SITE_OTHER): Payer: Medicare Other

## 2018-03-13 ENCOUNTER — Ambulatory Visit: Payer: Medicare Other | Admitting: Orthopaedic Surgery

## 2018-03-13 ENCOUNTER — Encounter: Payer: Self-pay | Admitting: Orthopaedic Surgery

## 2018-03-13 VITALS — BP 127/68 | HR 66 | Ht 64.0 in | Wt 159.0 lb

## 2018-03-13 DIAGNOSIS — M25561 Pain in right knee: Secondary | ICD-10-CM | POA: Diagnosis not present

## 2018-03-13 DIAGNOSIS — G8929 Other chronic pain: Secondary | ICD-10-CM

## 2018-03-13 NOTE — Progress Notes (Signed)
g ri

## 2018-03-13 NOTE — Progress Notes (Signed)
CC:  I have pain of my right knee. I would like an injection.  The patient has chronic pain of the right knee.  There is no recent trauma.  There is no redness.  Injections in the past have helped.  The knee has no redness, has an effusion and crepitus present.  ROM of the right knee is 0-95.  Impression:  Chronic knee pain right  Return: 2 weeks  PROCEDURE NOTE:  The patient requests injections of the right knee , verbal consent was obtained.  The right knee was prepped appropriately after time out was performed.   Sterile technique was observed and injection of 1 cc of Depo-Medrol 40 mg with several cc's of plain xylocaine. Anesthesia was provided by ethyl chloride and a 20-gauge needle was used to inject the knee area. The injection was tolerated well.  A band aid dressing was applied.  The patient was advised to apply ice later today and tomorrow to the injection sight as needed.  An xray was done of the right knee, reported separately.  She has more wear laterally.  She may need a total knee.  I told her to think about it.  Return in two weeks.  Call if any problem.  Precautions discussed.   Electronically Signed Sanjuana Kava, MD 12/31/201910:43 AM

## 2018-03-19 ENCOUNTER — Telehealth: Payer: Self-pay | Admitting: Orthopaedic Surgery

## 2018-03-19 NOTE — Telephone Encounter (Signed)
Spoke with pt and recommended 20 mins icing 3-4 times a day, arthritis strength tylenol, elevation, and topical rubs. Pt verbalized understanding and will call if pain persists.

## 2018-03-19 NOTE — Telephone Encounter (Signed)
Patient called stating she was here on 03/13/18 and got an injection in her knee. She states it is still hurting and she has been using the ice and heat. Her questions are: How often she do the ice and heat?     Can she take anything to help with the pain?   Please call and advise her  [phone]

## 2018-03-27 ENCOUNTER — Encounter: Payer: Self-pay | Admitting: Orthopaedic Surgery

## 2018-03-27 ENCOUNTER — Ambulatory Visit: Payer: Medicare Other | Admitting: Orthopaedic Surgery

## 2018-03-27 VITALS — BP 153/83 | HR 69 | Ht 66.0 in | Wt 161.0 lb

## 2018-03-27 DIAGNOSIS — G8929 Other chronic pain: Secondary | ICD-10-CM

## 2018-03-27 DIAGNOSIS — M25561 Pain in right knee: Secondary | ICD-10-CM | POA: Diagnosis not present

## 2018-03-27 NOTE — Progress Notes (Signed)
[name], female DOB:1934-12-12, 84 y.o. UUV:[id number]  Chief Complaint  Nicole presents with  . Knee Pain    right     HPI  [name] is a 84 y.o. female who has right knee pain.  She had an injection last time but she said it took a week to 10 days to be effective.  She has swelling and popping. She has no new trauma.  She would like to have a knee brace. I will provide one today.  She cannot take any NSAIDs. She has no giving way, no locking or redness.   Body mass index is 25.99 kg/m.  ROS  Review of Systems  HENT: Negative for congestion.   Respiratory: Negative for cough and shortness of breath.   Cardiovascular: Negative for chest pain and leg swelling.  Endocrine: Negative for cold intolerance.  Musculoskeletal: Positive for arthralgias, gait problem and joint swelling.  Allergic/Immunologic: Positive for environmental allergies.    All other systems reviewed and are negative.  The following is a summary of the past history medically, past history surgically, known current medicines, social history and family history.  This information is gathered electronically by the computer from prior information and documentation.  I review this each visit and have found including this information at this point in the chart is beneficial and informative.    Past Medical History:  Diagnosis Date  . Allergy   . Arthritis   . Colon cancer ([place])    colon ca dx 07/30/09  . History of cardiac monitoring 07/2017   "Event monitor demonstrated sinus rhythm with isolated PACs and no arrhythmias"  . History of colon cancer 06/2009   found at time of TCS 06/29/09, 1.2cm sessile cecal polyp, no adjuvent therapy needed  . HTN (hypertension)   . Hx of cardiovascular stress test 07/2017   "No diagnostic ST segment changes to indicate ischemia. Small, moderate intensity, reversible apical to basal inferolateral defect consistent with ischemia. This is a low  risk study. Nuclear stress EF: 84%."  . Hyperlipidemia   . Hypothyroidism   . Renal disorder    cyst on kidney   . Stroke ([place])   . Vertigo     Past Surgical History:  Procedure Laterality Date  . ABDOMINAL HYSTERECTOMY    . COLON SURGERY  07/2009   right hemicolectomy, no residual colon cancer on path  . COLONOSCOPY  07/16/2010   [id number] POLYP-TCS 3 YEARS  . COLONOSCOPY N/A 08/02/2013   Procedure: COLONOSCOPY;  Surgeon: Danie Binder, MD;  Location: [facility];  Service: Endoscopy;  Laterality: N/A;  9:30  . PARTIAL HYSTERECTOMY    . PARTIAL THYMECTOMY    . partial thyroidectomy     benign tumors    Family History  Problem Relation Age of Onset  . Colon cancer Mother        >age60  . Arthritis Mother   . Cancer Mother   . Heart disease Mother   . Hyperlipidemia Mother   . Hypertension Mother   . Heart attack Father   . Heart disease Father   . Diabetes Maternal Aunt   . Hyperlipidemia Daughter   . Hypertension Daughter   . Liver disease Neg Hx     Social History Social History   Tobacco Use  . Smoking status: Never Smoker  . Smokeless tobacco: Never Used  Substance Use Topics  . Alcohol use: No    Alcohol/week: 0.0 standard drinks  . Drug use: No  No Known Allergies  Current Outpatient Medications  Medication Sig Dispense Refill  . acetaminophen (TYLENOL) 500 MG tablet Take 1,000 mg by mouth every 8 (eight) hours as needed (cold).    Marland Kitchen amLODipine (NORVASC) 5 MG tablet Take 1 tablet (5 mg total) by mouth daily. 90 tablet 2  . aspirin EC 81 MG tablet Take 2 tablets (162 mg total) by mouth daily.    Marland Kitchen atorvastatin (LIPITOR) 80 MG tablet TAKE (1) TABLET BY MOUTH ONCE A DAY. 90 tablet 2  . Cholecalciferol (VITAMIN D) 2000 units CAPS Take 1,000 Units by mouth daily.    Marland Kitchen gabapentin (NEURONTIN) 100 MG capsule Take 1 in the morning and 2 in the evening 270 capsule 3  . levothyroxine (SYNTHROID, LEVOTHROID) 25 MCG tablet Take 1 tablet (25 mcg total)  by mouth daily before breakfast. 30 tablet 11  . linaclotide (LINZESS) 72 MCG capsule TAKE 1 CAPSULE BY MOUTH DAILY BEFORE BREAKFAST. 90 capsule 1  . metoprolol tartrate (LOPRESSOR) 25 MG tablet Take 1 tablet (25 mg total) by mouth 2 (two) times daily. 180 tablet 3  . RESTASIS 0.05 % ophthalmic emulsion Place 1 drop into both eyes 2 (two) times daily.      No current facility-administered medications for this visit.      Physical Exam  Blood pressure (!) 153/83, pulse 69, height 5\' 6"  (1.676 m), weight 161 lb (73 kg).  Constitutional: overall normal hygiene, normal nutrition, well developed, normal grooming, normal body habitus. Assistive device:none  Musculoskeletal: gait and station Limp right, muscle tone and strength are normal, no tremors or atrophy is present.  .  Neurological: coordination overall normal.  Deep tendon reflex/nerve stretch intact.  Sensation normal.  Cranial nerves II-XII intact.   Skin:   Normal overall no scars, lesions, ulcers or rashes. No psoriasis.  Psychiatric: Alert and oriented x 3.  Recent memory intact, remote memory unclear.  Normal mood and affect. Well groomed.  Good eye contact.  Cardiovascular: overall no swelling, no varicosities, no edema bilaterally, normal temperatures of the legs and arms, no clubbing, cyanosis and good capillary refill.  Lymphatic: palpation is normal.  Right knee has slight effusion, crepitus, ROM 0 to 105, limp to the right, NV intact. Knee is stable.  All other systems reviewed and are negative   The Nicole has been educated about the nature of the problem(s) and counseled on treatment options.  The Nicole appeared to understand what I have discussed and is in agreement with it.  Encounter Diagnosis  Name Primary?  . Chronic pain of right knee Yes    PLAN Call if any problems.  Precautions discussed.  Continue current medications.   Return to clinic 2 weeks   Electronically Signed Sanjuana Kava,  MD 1/14/202010:23 AM

## 2018-04-10 ENCOUNTER — Ambulatory Visit: Payer: Medicare Other | Admitting: Orthopaedic Surgery

## 2018-04-10 ENCOUNTER — Encounter: Payer: Self-pay | Admitting: Orthopaedic Surgery

## 2018-04-10 VITALS — BP 127/79 | HR 67 | Ht 66.0 in | Wt 161.0 lb

## 2018-04-10 DIAGNOSIS — M25561 Pain in right knee: Secondary | ICD-10-CM

## 2018-04-10 DIAGNOSIS — G8929 Other chronic pain: Secondary | ICD-10-CM | POA: Diagnosis not present

## 2018-04-10 NOTE — Progress Notes (Signed)
CC:  I have pain of my right knee. I would like an injection.  The patient has chronic pain of the right knee.  There is no recent trauma.  There is no redness.  Injections in the past have helped.  The knee has no redness, has an effusion and crepitus present.  ROM of the right knee is 0-105.  Impression:  Chronic knee pain right  Return: 1 month  PROCEDURE NOTE:  The patient requests injections of the right knee , verbal consent was obtained.  The right knee was prepped appropriately after time out was performed.   Sterile technique was observed and injection of 1 cc of Depo-Medrol 40 mg with several cc's of plain xylocaine. Anesthesia was provided by ethyl chloride and a 20-gauge needle was used to inject the knee area. The injection was tolerated well.  A band aid dressing was applied.  The patient was advised to apply ice later today and tomorrow to the injection sight as needed.  Electronically Signed Sanjuana Kava, MD 1/28/202010:43 AM

## 2018-04-26 ENCOUNTER — Emergency Department (HOSPITAL_COMMUNITY): Payer: Medicare Other

## 2018-04-26 ENCOUNTER — Observation Stay (HOSPITAL_COMMUNITY)
Admission: EM | Admit: 2018-04-26 | Discharge: 2018-04-28 | Disposition: A | Discharge status: Home / Self Care | Payer: Medicare Other | Attending: Internal Medicine | Admitting: Internal Medicine

## 2018-04-26 ENCOUNTER — Encounter (HOSPITAL_COMMUNITY): Payer: Self-pay | Admitting: Emergency Medicine

## 2018-04-26 ENCOUNTER — Other Ambulatory Visit: Payer: Self-pay

## 2018-04-26 DIAGNOSIS — I2699 Other pulmonary embolism without acute cor pulmonale: Principal | ICD-10-CM | POA: Insufficient documentation

## 2018-04-26 DIAGNOSIS — R0602 Shortness of breath: Secondary | ICD-10-CM | POA: Diagnosis not present

## 2018-04-26 DIAGNOSIS — Z85038 Personal history of other malignant neoplasm of large intestine: Secondary | ICD-10-CM | POA: Insufficient documentation

## 2018-04-26 DIAGNOSIS — R0789 Other chest pain: Secondary | ICD-10-CM | POA: Diagnosis not present

## 2018-04-26 DIAGNOSIS — R079 Chest pain, unspecified: Secondary | ICD-10-CM | POA: Diagnosis not present

## 2018-04-26 DIAGNOSIS — I129 Hypertensive chronic kidney disease with stage 1 through stage 4 chronic kidney disease, or unspecified chronic kidney disease: Secondary | ICD-10-CM | POA: Diagnosis not present

## 2018-04-26 DIAGNOSIS — Z8673 Personal history of transient ischemic attack (TIA), and cerebral infarction without residual deficits: Secondary | ICD-10-CM | POA: Diagnosis not present

## 2018-04-26 DIAGNOSIS — E039 Hypothyroidism, unspecified: Secondary | ICD-10-CM | POA: Diagnosis not present

## 2018-04-26 DIAGNOSIS — I10 Essential (primary) hypertension: Secondary | ICD-10-CM | POA: Diagnosis not present

## 2018-04-26 DIAGNOSIS — N183 Chronic kidney disease, stage 3 unspecified: Secondary | ICD-10-CM | POA: Diagnosis present

## 2018-04-26 DIAGNOSIS — N1832 Chronic kidney disease, stage 3b: Secondary | ICD-10-CM | POA: Diagnosis present

## 2018-04-26 DIAGNOSIS — Z7982 Long term (current) use of aspirin: Secondary | ICD-10-CM | POA: Diagnosis not present

## 2018-04-26 DIAGNOSIS — N184 Chronic kidney disease, stage 4 (severe): Secondary | ICD-10-CM | POA: Diagnosis present

## 2018-04-26 DIAGNOSIS — Z86711 Personal history of pulmonary embolism: Secondary | ICD-10-CM | POA: Diagnosis present

## 2018-04-26 DIAGNOSIS — R071 Chest pain on breathing: Secondary | ICD-10-CM | POA: Diagnosis present

## 2018-04-26 DIAGNOSIS — R109 Unspecified abdominal pain: Secondary | ICD-10-CM | POA: Diagnosis not present

## 2018-04-26 DIAGNOSIS — Z79899 Other long term (current) drug therapy: Secondary | ICD-10-CM | POA: Insufficient documentation

## 2018-04-26 LAB — COMPREHENSIVE METABOLIC PANEL
ALT: 23 U/L (ref 0–44)
AST: 17 U/L (ref 15–41)
Albumin: 3.7 g/dL (ref 3.5–5.0)
Alkaline Phosphatase: 87 U/L (ref 38–126)
Anion gap: 8 (ref 5–15)
BUN: 18 mg/dL (ref 8–23)
CO2: 25 mmol/L (ref 22–32)
CREATININE: 1.24 mg/dL — AB (ref 0.44–1.00)
Calcium: 9 mg/dL (ref 8.9–10.3)
Chloride: 105 mmol/L (ref 98–111)
GFR calc Af Amer: 47 mL/min — ABNORMAL LOW (ref >60)
GFR calc non Af Amer: 41 mL/min — ABNORMAL LOW (ref >60)
Glucose, Bld: 111 mg/dL — ABNORMAL HIGH (ref 70–99)
Potassium: 3.8 mmol/L (ref 3.5–5.1)
Sodium: 138 mmol/L (ref 135–145)
Total Bilirubin: 0.9 mg/dL (ref 0.3–1.2)
Total Protein: 7.6 g/dL (ref 6.5–8.1)

## 2018-04-26 LAB — CBC WITH DIFFERENTIAL/PLATELET
Abs Immature Granulocytes: 0.02 10*3/uL (ref 0.00–0.07)
BASOS ABS: 0 10*3/uL (ref 0.0–0.1)
Basophils Relative: 1 %
Eosinophils Absolute: 0.1 10*3/uL (ref 0.0–0.5)
Eosinophils Relative: 1 %
HCT: 41.9 % (ref 36.0–46.0)
Hemoglobin: 13.4 g/dL (ref 12.0–15.0)
Immature Granulocytes: 0 %
LYMPHS PCT: 22 %
Lymphs Abs: 1.9 10*3/uL (ref 0.7–4.0)
MCH: 32.1 pg (ref 26.0–34.0)
MCHC: 32 g/dL (ref 30.0–36.0)
MCV: 100.2 fL — ABNORMAL HIGH (ref 80.0–100.0)
Monocytes Absolute: 0.6 10*3/uL (ref 0.1–1.0)
Monocytes Relative: 7 %
Neutro Abs: 5.8 10*3/uL (ref 1.7–7.7)
Neutrophils Relative %: 69 %
Platelets: 285 10*3/uL (ref 150–400)
RBC: 4.18 MIL/uL (ref 3.87–5.11)
RDW: 14.7 % (ref 11.5–15.5)
WBC: 8.4 10*3/uL (ref 4.0–10.5)
nRBC: 0 % (ref 0.0–0.2)

## 2018-04-26 LAB — HEPARIN LEVEL (UNFRACTIONATED): Heparin Unfractionated: <0.1 IU/mL — ABNORMAL LOW (ref 0.30–0.70)

## 2018-04-26 LAB — TROPONIN I: Troponin I: <0.03 ng/mL (ref <0.03)

## 2018-04-26 LAB — D-DIMER, QUANTITATIVE: D-Dimer, Quant: 2.96 ug/mL-FEU — ABNORMAL HIGH (ref 0.00–0.50)

## 2018-04-26 LAB — APTT: APTT: 34 s (ref 24–36)

## 2018-04-26 MED ORDER — SODIUM CHLORIDE 0.9 % IV SOLN
INTRAVENOUS | Status: DC
  Administered 2018-04-26: 23:00:00 via INTRAVENOUS

## 2018-04-26 MED ORDER — HEPARIN BOLUS VIA INFUSION
3400.0000 [IU] | Freq: Once | INTRAVENOUS | Status: AC
  Administered 2018-04-26: 3400 [IU] via INTRAVENOUS

## 2018-04-26 MED ORDER — LINACLOTIDE 72 MCG PO CAPS
72.0000 ug | ORAL_CAPSULE | Freq: Every day | ORAL | Status: DC
  Administered 2018-04-27: 72 ug via ORAL
  Filled 2018-04-26 (×4): qty 1

## 2018-04-26 MED ORDER — ONDANSETRON HCL 4 MG/2ML IJ SOLN: 4.0000 mg | Freq: Four times a day (QID) | INTRAMUSCULAR | Status: DC | PRN

## 2018-04-26 MED ORDER — ACETAMINOPHEN 325 MG PO TABS
650.0000 mg | ORAL_TABLET | Freq: Four times a day (QID) | ORAL | Status: DC | PRN
  Administered 2018-04-27: 650 mg via ORAL
  Filled 2018-04-26: qty 2

## 2018-04-26 MED ORDER — ASPIRIN EC 81 MG PO TBEC
162.0000 mg | DELAYED_RELEASE_TABLET | Freq: Every day | ORAL | Status: DC
  Administered 2018-04-27 – 2018-04-28 (×2): 162 mg via ORAL
  Filled 2018-04-26 (×2): qty 2

## 2018-04-26 MED ORDER — CYCLOSPORINE 0.05 % OP EMUL
1.0000 [drp] | Freq: Two times a day (BID) | OPHTHALMIC | Status: DC
  Administered 2018-04-27 – 2018-04-28 (×3): 1 [drp] via OPHTHALMIC
  Filled 2018-04-26 (×4): qty 30

## 2018-04-26 MED ORDER — SODIUM CHLORIDE 0.9% FLUSH
3.0000 mL | Freq: Two times a day (BID) | INTRAVENOUS | Status: DC
  Administered 2018-04-27 – 2018-04-28 (×2): 3 mL via INTRAVENOUS

## 2018-04-26 MED ORDER — POLYETHYLENE GLYCOL 3350 17 G PO PACK: 17.0000 g | PACK | Freq: Every day | ORAL | Status: DC | PRN

## 2018-04-26 MED ORDER — IOPAMIDOL (ISOVUE-370) INJECTION 76%
100.0000 mL | Freq: Once | INTRAVENOUS | Status: AC | PRN
  Administered 2018-04-26: 100 mL via INTRAVENOUS

## 2018-04-26 MED ORDER — GABAPENTIN 100 MG PO CAPS
100.0000 mg | ORAL_CAPSULE | Freq: Two times a day (BID) | ORAL | Status: DC
  Administered 2018-04-26 – 2018-04-28 (×4): 100 mg via ORAL
  Filled 2018-04-26 (×4): qty 1

## 2018-04-26 MED ORDER — ATORVASTATIN CALCIUM 40 MG PO TABS
80.0000 mg | ORAL_TABLET | Freq: Every day | ORAL | Status: DC
  Administered 2018-04-26 – 2018-04-27 (×2): 80 mg via ORAL
  Filled 2018-04-26 (×2): qty 2

## 2018-04-26 MED ORDER — ACETAMINOPHEN 650 MG RE SUPP: 650.0000 mg | Freq: Four times a day (QID) | RECTAL | Status: DC | PRN

## 2018-04-26 MED ORDER — HEPARIN (PORCINE) 25000 UT/250ML-% IV SOLN
1100.0000 [IU]/h | INTRAVENOUS | Status: DC
  Administered 2018-04-26: 1100 [IU]/h via INTRAVENOUS
  Filled 2018-04-26: qty 250

## 2018-04-26 MED ORDER — HYDROCODONE-ACETAMINOPHEN 5-325 MG PO TABS: 1.0000 | ORAL_TABLET | ORAL | Status: DC | PRN

## 2018-04-26 MED ORDER — ONDANSETRON HCL 4 MG PO TABS: 4.0000 mg | ORAL_TABLET | Freq: Four times a day (QID) | ORAL | Status: DC | PRN

## 2018-04-26 MED ORDER — LEVOTHYROXINE SODIUM 25 MCG PO TABS
25.0000 ug | ORAL_TABLET | Freq: Every day | ORAL | Status: DC
  Administered 2018-04-27 – 2018-04-28 (×2): 25 ug via ORAL
  Filled 2018-04-26 (×2): qty 1

## 2018-04-26 NOTE — H&P (Signed)
History and Physical    [name] YQI:[id number] DOB: [birth year] DOA: 04/26/2018  PCP: Alycia Rossetti, MD   Patient coming from: Home   Chief Complaint: Right chest pain    HPI: JOYCELIN Bailey is a 84 y.o. female with medical history significant for hypertension, chronic kidney disease stage III, hypothyroidism, and history of colon cancer status post right hemicolectomy in 2011, now presenting to the emergency department with 1 to 2 days of right-sided pleuritic pain.  Patient had been in her usual state of health until approximately 2 days ago when she noted some pain at the right lateral chest, much worse with inspiration.  There has not been any traumatic or inciting event recalled.  She has not been particularly dyspneic and denies any significant cough.  She denies lower extremity swelling or tenderness.  She has chronic knee pain for which she receives injections and has been using a knee brace on the right, but continues to be mobile and without any swelling or tenderness.  She denies any long-distance travel or recent surgery.  She denies any personal or family history of VTE.  She has history of colon cancer status post resection with colonoscopy 5 years ago reassuring.  She had a negative mammogram in December 2019.  She is status post hysterectomy.  ED Course: Upon arrival to the ED, patient is found to be afebrile, saturating adequately on room air, and with vitals otherwise stable.  EKG features a sinus rhythm with nonspecific T wave abnormalities in the lateral leads.  Chemistry panel is notable for a creatinine 1.24, similar to priors.  CBC features a slight macrocytosis without anemia.  D-dimer is elevated to 2.96.  CTA chest reveals acute bilateral pulmonary embolism and small right pleural effusion with no CT-evidence for right heart strain.  Patient has remained hemodynamically stable in the emergency department and will be observed for further evaluation and  management.  Review of Systems:  All other systems reviewed and apart from HPI, are negative.  Past Medical History:  Diagnosis Date  . Allergy   . Arthritis   . Colon cancer ([place])    colon ca dx 07/30/09  . History of cardiac monitoring 07/2017   "Event monitor demonstrated sinus rhythm with isolated PACs and no arrhythmias"  . History of colon cancer 06/2009   found at time of TCS 06/29/09, 1.2cm sessile cecal polyp, no adjuvent therapy needed  . HTN (hypertension)   . Hx of cardiovascular stress test 07/2017   "No diagnostic ST segment changes to indicate ischemia. Small, moderate intensity, reversible apical to basal inferolateral defect consistent with ischemia. This is a low risk study. Nuclear stress EF: 84%."  . Hyperlipidemia   . Hypothyroidism   . Renal disorder    cyst on kidney   . Stroke ([place])   . Vertigo     Past Surgical History:  Procedure Laterality Date  . ABDOMINAL HYSTERECTOMY    . COLON SURGERY  07/2009   right hemicolectomy, no residual colon cancer on path  . COLONOSCOPY  07/16/2010   LOV:FIEPPIRJJOAC POLYP-TCS 3 YEARS  . COLONOSCOPY N/A 08/02/2013   Procedure: COLONOSCOPY;  Surgeon: Danie Binder, MD;  Location: [facility];  Service: Endoscopy;  Laterality: N/A;  9:30  . PARTIAL HYSTERECTOMY    . PARTIAL THYMECTOMY    . partial thyroidectomy     benign tumors     reports that she has never smoked. She has never used smokeless tobacco. She reports that  she does not drink alcohol or use drugs.  No Known Allergies  Family History  Problem Relation Age of Onset  . Colon cancer Mother        >age60  . Arthritis Mother   . Cancer Mother   . Heart disease Mother   . Hyperlipidemia Mother   . Hypertension Mother   . Heart attack Father   . Heart disease Father   . Diabetes Maternal Aunt   . Hyperlipidemia Daughter   . Hypertension Daughter   . Liver disease Neg Hx      Prior to Admission medications   Medication Sig Start Date End Date  Taking? Authorizing Provider  acetaminophen (TYLENOL) 500 MG tablet Take 1,000 mg by mouth every 8 (eight) hours as needed (cold).   Yes [provider]  Artificial Tear Ointment (DRY EYES OP) Apply 1 drop to eye 2 (two) times daily as needed (dry eyes).   Yes [provider]  aspirin EC 81 MG tablet Take 2 tablets (162 mg total) by mouth daily. 03/04/16  Yes [place], Modena Nunnery, MD  atorvastatin (LIPITOR) 80 MG tablet TAKE (1) TABLET BY MOUTH ONCE A DAY. 02/28/18  Yes [place], Modena Nunnery, MD  Cholecalciferol (VITAMIN D) 2000 units CAPS Take 1,000 Units by mouth daily.   Yes [provider]  gabapentin (NEURONTIN) 100 MG capsule Take 1 in the morning and 2 in the evening 02/28/18  Yes [place], Modena Nunnery, MD  levothyroxine (SYNTHROID, LEVOTHROID) 25 MCG tablet Take 1 tablet (25 mcg total) by mouth daily before breakfast. 02/28/18  Yes , Modena Nunnery, MD  linaclotide (LINZESS) 72 MCG capsule TAKE 1 CAPSULE BY MOUTH DAILY BEFORE BREAKFAST. 02/28/18  Yes , Modena Nunnery, MD  metoprolol tartrate (LOPRESSOR) 25 MG tablet Take 1 tablet (25 mg total) by mouth 2 (two) times daily. 02/23/18 02/18/19 Yes [place], Fransisco Hertz, PA-C  RESTASIS 0.05 % ophthalmic emulsion Place 1 drop into both eyes 2 (two) times daily.  05/17/10  Yes [provider]  amLODipine (NORVASC) 5 MG tablet Take 1 tablet (5 mg total) by mouth daily. Patient not taking: Reported on 04/26/2018 02/28/18   Alycia Rossetti, MD    Physical Exam: Vitals:   04/26/18 1556 04/26/18 1700 04/26/18 1730 04/26/18 1800  BP: (!) 159/79 124/82 129/72 128/72  Pulse: 73 66 65 69  Resp: 18 18 16 18   Temp: 99.1 F (37.3 C)     TempSrc: Oral     SpO2: 98% 97% 97% 97%  Weight:      Height:        Constitutional: NAD, calm  Eyes: PERTLA, lids and conjunctivae normal ENMT: Mucous membranes are moist. Posterior pharynx clear of any exudate or lesions.   Neck: normal, supple, no masses, no thyromegaly Respiratory:  clear to auscultation bilaterally, no wheezing, no crackles. Normal respiratory effort.    Cardiovascular: S1 & S2 heard, regular rate and rhythm. No extremity edema.     Abdomen: No distension, no tenderness, soft. Bowel sounds normal.  Musculoskeletal: no clubbing / cyanosis. No joint deformity upper and lower extremities.   Skin: no significant rashes, lesions, ulcers. Warm, dry, well-perfused. Neurologic: CN 2-12 grossly intact. Sensation intact. Strength 5/5 in all 4 limbs.  Psychiatric:  Alert and oriented x 3. Pleasant and cooperative.    Labs on Admission: I have personally reviewed following labs and imaging studies  CBC: Recent Labs  Lab 04/26/18 1642  WBC 8.4  NEUTROABS 5.8  HGB 13.4  HCT 41.9  MCV 100.2*  PLT 099   Basic Metabolic Panel: Recent Labs  Lab 04/26/18 1642  NA 138  K 3.8  CL 105  CO2 25  GLUCOSE 111*  BUN 18  CREATININE 1.24*  CALCIUM 9.0   GFR: Estimated Creatinine Clearance: 34 mL/min (A) (by C-G formula based on SCr of 1.24 mg/dL (H)). Liver Function Tests: Recent Labs  Lab 04/26/18 1642  AST 17  ALT 23  ALKPHOS 87  BILITOT 0.9  PROT 7.6  ALBUMIN 3.7   No results for input(s): LIPASE, AMYLASE in the last 168 hours. No results for input(s): AMMONIA in the last 168 hours. Coagulation Profile: No results for input(s): INR, PROTIME in the last 168 hours. Cardiac Enzymes: No results for input(s): CKTOTAL, CKMB, CKMBINDEX, TROPONINI in the last 168 hours. BNP (last 3 results) No results for input(s): PROBNP in the last 8760 hours. HbA1C: No results for input(s): HGBA1C in the last 72 hours. CBG: No results for input(s): GLUCAP in the last 168 hours. Lipid Profile: No results for input(s): CHOL, HDL, LDLCALC, TRIG, CHOLHDL, LDLDIRECT in the last 72 hours. Thyroid Function Tests: No results for input(s): TSH, T4TOTAL, FREET4, T3FREE, THYROIDAB in the last 72 hours. Anemia Panel: No results for input(s): VITAMINB12, FOLATE, FERRITIN,  TIBC, IRON, RETICCTPCT in the last 72 hours. Urine analysis:    Component Value Date/Time   COLORURINE COLORLESS (A) 11/18/2017 0921   APPEARANCEUR CLEAR 11/18/2017 0921   LABSPEC 1.003 (L) 11/18/2017 0921   PHURINE 7.0 11/18/2017 0921   GLUCOSEU NEGATIVE 11/18/2017 0921   HGBUR NEGATIVE 11/18/2017 0921   BILIRUBINUR NEGATIVE 11/18/2017 0921   KETONESUR NEGATIVE 11/18/2017 0921   PROTEINUR NEGATIVE 11/18/2017 0921   UROBILINOGEN 0.2 03/12/2012 0022   NITRITE NEGATIVE 11/18/2017 0921   LEUKOCYTESUR TRACE (A) 11/18/2017 0921   Sepsis Labs: @LABRCNTIP (procalcitonin:4,lacticidven:4) )No results found for this or any previous visit (from the past 240 hour(s)).   Radiological Exams on Admission: Dg Chest 2 View  Result Date: 04/26/2018 CLINICAL DATA:  Right-sided chest pain and fever for 2 days. EXAM: CHEST - 2 VIEW COMPARISON:  11/18/2017 FINDINGS: The heart is upper limits of normal in size and stable. There is mild tortuosity of the thoracic aorta. The pulmonary hila appear normal. No worrisome pulmonary lesions. Patchy bibasilar density, likely atelectasis. Could not exclude developing infiltrates. No pleural effusions. IMPRESSION: Streaky bibasilar atelectasis versus early infiltrates. Electronically Signed   By: Marijo Sanes M.D.   On: 04/26/2018 16:40   Ct Angio Chest Pe W And/or Wo Contrast  Result Date: 04/26/2018 CLINICAL DATA:  Shortness of breath and right-sided chest pain, initial encounter EXAM: CT ANGIOGRAPHY CHEST WITH CONTRAST TECHNIQUE: Multidetector CT imaging of the chest was performed using the standard protocol during bolus administration of intravenous contrast. Multiplanar CT image reconstructions and MIPs were obtained to evaluate the vascular anatomy. CONTRAST:  129mL ISOVUE-370 IOPAMIDOL (ISOVUE-370) INJECTION 76% COMPARISON:  Plain film from earlier in the same day, CT of the chest from 04/03/2013. FINDINGS: Cardiovascular: Thoracic aorta is well visualized and  demonstrates a normal branching pattern. No aneurysmal dilatation or dissection is seen. The heart is mildly prominent although no evidence of right heart strain is seen. The pulmonary artery demonstrates a normal branching pattern with bilateral filling defects right greater than left consistent with acute pulmonary embolism. These are most notable within the lower lobe on the right. Again no right heart strain is seen. Mediastinum/Nodes: The esophagus is within normal limits. No hilar or mediastinal adenopathy is noted. Thoracic  inlet demonstrates a large left thyroid nodule incompletely evaluated on this exam but stable in appearance from the prior CT of 2015 most consistent with a benign thyroid nodule. Lungs/Pleura: Small right pleural effusion is noted. Bibasilar atelectatic changes are seen. Some mild right middle lobe atelectatic changes are noted as well. No sizable nodule is seen. No focal confluent infiltrate is noted. Upper Abdomen: Visualized upper abdomen is within normal limits. Musculoskeletal: Degenerative changes of the thoracic spine are noted. Review of the MIP images confirms the above findings. IMPRESSION: Bilateral pleural effusion with small right pleural effusion as described. No right heart failure is noted. Mild bibasilar atelectatic changes are seen. Stable left thyroid nodule. Aortic Atherosclerosis (ICD10-I70.0). Critical Value/emergent results were called by telephone at the time of interpretation on 04/26/2018 at 7:35 pm to Dr. Milton Ferguson , who verbally acknowledged these results. Electronically Signed   By: Inez Catalina M.D.   On: 04/26/2018 19:42    EKG: Independently reviewed. Sinus rhythm, non-specific T-wave abnormality in lateral leads.   Assessment/Plan   1. Acute pulmonary embolism  - Presents with 1-2 days of right-sided pleuritic pain, found to have acute bilateral pulmonary emboli without CT-evidence for right heart strain   - She is hemodynamically stable on  admission, not hypoxic - She has hx of colon cancer; no other risk factors identified; she had right hemicolectomy in 2011, polypectomy x9 in 2015 with benign path; she had negative mammogram in Dec '19; s/p hysterectomy   - Start IV heparin infusion, continue cardiac monitoring for now and check a troponin, check venous LE dopplers, hold beta-blocker initially    2. CKD stage III  - SCr is 1.24 on admission, similar to priors  - Renally-dose medications, hydrate gently overnight after CT contrast, repeat chem panel in am    3. Hypertension  - BP at goal  - Hold Lopressor initially in setting of acute PE    4. Hypothyroidism  - Continue Synthroid     DVT prophylaxis: IV heparin infusion  Code Status: Full  Family Communication: Discussed with patient  Consults called: None Admission status: Observation     Vianne Bulls, MD [facility] Pager [phone]  If 7PM-7AM, please contact night-coverage [email or web address] Password [facility]  04/26/2018, 8:00 PM

## 2018-04-26 NOTE — ED Triage Notes (Signed)
Patient reports right sided rib pain since having a "catch in her side yesterday." No SOB, CO, dizziness, nausea or diaphoresis. Patient states she hurts worse with inspiration, began running a low grade temp today. Denies cough.

## 2018-04-26 NOTE — ED Provider Notes (Signed)
[facility] EMERGENCY DEPARTMENT Provider Note   CSN: [id number] Arrival date & time: 04/26/18  1546     History   Chief Complaint Chief Complaint  Patient presents with  . Chest Pain    HPI [name] is a 84 y.o. female.  Patient complains of right-sided chest pain with inspiration.  The history is provided by the patient. No language interpreter was used.  Chest Pain  Pain location:  R chest Pain quality: aching   Pain radiates to:  Does not radiate Pain severity:  Moderate Onset quality:  Sudden Timing:  Constant Progression:  Worsening Chronicity:  New Context: breathing   Relieved by:  Nothing Worsened by:  Nothing Ineffective treatments:  None tried Associated symptoms: abdominal pain   Associated symptoms: no back pain, no cough, no fatigue and no headache     Past Medical History:  Diagnosis Date  . Allergy   . Arthritis   . Colon cancer ([place])    colon ca dx 07/30/09  . History of cardiac monitoring 07/2017   "Event monitor demonstrated sinus rhythm with isolated PACs and no arrhythmias"  . History of colon cancer 06/2009   found at time of TCS 06/29/09, 1.2cm sessile cecal polyp, no adjuvent therapy needed  . HTN (hypertension)   . Hx of cardiovascular stress test 07/2017   "No diagnostic ST segment changes to indicate ischemia. Small, moderate intensity, reversible apical to basal inferolateral defect consistent with ischemia. This is a low risk study. Nuclear stress EF: 84%."  . Hyperlipidemia   . Hypothyroidism   . Renal disorder    cyst on kidney   . Stroke ([place])   . Vertigo     Patient Active Problem List   Diagnosis Date Noted  . Acute pulmonary embolism ([place]) 04/26/2018  . Insomnia 12/15/2015  . Paresthesia of left upper and lower extremity 11/21/2015  . Left leg weakness 11/21/2015  . Paresthesias   . Essential hypertension 11/02/2015  . Hypothyroidism 11/02/2015  . Vertigo 11/02/2015  . CKD (chronic kidney disease),  stage III ([place]) 11/02/2015  . Loss of weight 11/02/2015  . OA (osteoarthritis) of knee 11/02/2015  . Colon cancer ([place]) 02/28/2011  . History of colon cancer 06/29/2010  . Constipation 06/10/2009    Past Surgical History:  Procedure Laterality Date  . ABDOMINAL HYSTERECTOMY    . COLON SURGERY  07/2009   right hemicolectomy, no residual colon cancer on path  . COLONOSCOPY  07/16/2010   [facility] POL[facility]-TCS 3 YEARS  . COLONOSCOPY N/A 08/02/2013   Procedure: COLONOSCOPY;  Surgeon: Danie Binder, MD;  Location: [facility] ENDO SUITE;  Service: Endoscopy;  Laterality: N/A;  9:30  . PARTIAL HYSTERECTOMY    . PARTIAL THYMECTOMY    . partial thyroidectomy     benign tumors     OB History    Gravida  3   Para  2   Term  1   Preterm  1   AB  1   Living        SAB  1   TAB      Ectopic      Multiple      Live Births               Home Medications    Prior to Admission medications   Medication Sig Start Date End Date Taking? Authorizing Provider  acetaminophen (TYLENOL) 500 MG tablet Take 1,000 mg by mouth every 8 (eight) hours as needed (cold).  Yes [provider]  Artificial Tear Ointment (DRY EYES OP) Apply 1 drop to eye 2 (two) times daily as needed (dry eyes).   Yes [provider]  aspirin EC 81 MG tablet Take 2 tablets (162 mg total) by mouth daily. 03/04/16  Yes Julian, Modena Nunnery, MD  atorvastatin (LIPITOR) 80 MG tablet TAKE (1) TABLET BY MOUTH ONCE A DAY. 02/28/18  Yes [place], Modena Nunnery, MD  Cholecalciferol (VITAMIN D) 2000 units CAPS Take 1,000 Units by mouth daily.   Yes [provider]  gabapentin (NEURONTIN) 100 MG capsule Take 1 in the morning and 2 in the evening 02/28/18  Yes [place], Modena Nunnery, MD  levothyroxine (SYNTHROID, LEVOTHROID) 25 MCG tablet Take 1 tablet (25 mcg total) by mouth daily before breakfast. 02/28/18  Yes [place], Modena Nunnery, MD  linaclotide (LINZESS) 72 MCG capsule TAKE 1 CAPSULE BY MOUTH DAILY BEFORE  BREAKFAST. 02/28/18  Yes Hoberg, Modena Nunnery, MD  metoprolol tartrate (LOPRESSOR) 25 MG tablet Take 1 tablet (25 mg total) by mouth 2 (two) times daily. 02/23/18 02/18/19 Yes Strader, Fransisco Hertz, PA-C  RESTASIS 0.05 % ophthalmic emulsion Place 1 drop into both eyes 2 (two) times daily.  05/17/10  Yes [provider]  amLODipine (NORVASC) 5 MG tablet Take 1 tablet (5 mg total) by mouth daily. Patient not taking: Reported on 04/26/2018 02/28/18   Alycia Rossetti, MD    Family History Family History  Problem Relation Age of Onset  . Colon cancer Mother        >age60  . Arthritis Mother   . Cancer Mother   . Heart disease Mother   . Hyperlipidemia Mother   . Hypertension Mother   . Heart attack Father   . Heart disease Father   . Diabetes Maternal Aunt   . Hyperlipidemia Daughter   . Hypertension Daughter   . Liver disease Neg Hx     Social History Social History   Tobacco Use  . Smoking status: Never Smoker  . Smokeless tobacco: Never Used  Substance Use Topics  . Alcohol use: No    Alcohol/week: 0.0 standard drinks  . Drug use: No     Allergies   Patient has no known allergies.   Review of Systems Review of Systems  Constitutional: Negative for appetite change and fatigue.  HENT: Negative for congestion, ear discharge and sinus pressure.   Eyes: Negative for discharge.  Respiratory: Negative for cough.   Cardiovascular: Positive for chest pain.  Gastrointestinal: Positive for abdominal pain. Negative for diarrhea.  Genitourinary: Negative for frequency and hematuria.  Musculoskeletal: Negative for back pain.  Skin: Negative for rash.  Neurological: Negative for seizures and headaches.  Psychiatric/Behavioral: Negative for hallucinations.     Physical Exam Updated Vital Signs BP 128/72   Pulse 69   Temp 99.1 F (37.3 C) (Oral)   Resp 18   Ht 5\' 3"  (1.6 m)   Wt 72.6 kg   SpO2 97%   BMI 28.34 kg/m   Physical Exam Constitutional:       Appearance: Normal appearance. She is well-developed.  HENT:     Head: Normocephalic.     Nose: Nose normal.  Eyes:     General: No scleral icterus.    Conjunctiva/sclera: Conjunctivae normal.  Neck:     Musculoskeletal: Neck supple.     Thyroid: No thyromegaly.  Cardiovascular:     Rate and Rhythm: Normal rate and regular rhythm.     Heart sounds: No murmur. No  friction rub. No gallop.   Pulmonary:     Breath sounds: No stridor. No wheezing or rales.  Chest:     Chest wall: No tenderness.  Abdominal:     General: There is no distension.     Tenderness: There is no abdominal tenderness. There is no rebound.  Musculoskeletal: Normal range of motion.  Lymphadenopathy:     Cervical: No cervical adenopathy.  Skin:    Findings: No erythema or rash.  Neurological:     Mental Status: She is alert and oriented to person, place, and time.     Motor: No abnormal muscle tone.     Coordination: Coordination normal.  Psychiatric:        Behavior: Behavior normal.      ED Treatments / Results  Labs (all labs ordered are listed, but only abnormal results are displayed) Labs Reviewed  CBC WITH DIFFERENTIAL/PLATELET - Abnormal; Notable for the following components:      Result Value   MCV 100.2 (*)    All other components within normal limits  COMPREHENSIVE METABOLIC PANEL - Abnormal; Notable for the following components:   Glucose, Bld 111 (*)    Creatinine, Ser 1.24 (*)    GFR calc non Af Amer 41 (*)    GFR calc Af Amer 47 (*)    All other components within normal limits  D-DIMER, QUANTITATIVE (NOT AT [facility]) - Abnormal; Notable for the following components:   D-Dimer, Quant 2.96 (*)    All other components within normal limits    EKG None  Radiology Dg Chest 2 View  Result Date: 04/26/2018 CLINICAL DATA:  Right-sided chest pain and fever for 2 days. EXAM: CHEST - 2 VIEW COMPARISON:  11/18/2017 FINDINGS: The heart is upper limits of normal in size and stable. There is mild  tortuosity of the thoracic aorta. The pulmonary hila appear normal. No worrisome pulmonary lesions. Patchy bibasilar density, likely atelectasis. Could not exclude developing infiltrates. No pleural effusions. IMPRESSION: Streaky bibasilar atelectasis versus early infiltrates. Electronically Signed   By: Marijo Sanes M.D.   On: 04/26/2018 16:40   Ct Angio Chest Pe W And/or Wo Contrast  Result Date: 04/26/2018 CLINICAL DATA:  Shortness of breath and right-sided chest pain, initial encounter EXAM: CT ANGIOGRAPHY CHEST WITH CONTRAST TECHNIQUE: Multidetector CT imaging of the chest was performed using the standard protocol during bolus administration of intravenous contrast. Multiplanar CT image reconstructions and MIPs were obtained to evaluate the vascular anatomy. CONTRAST:  158mL ISOVUE-370 IOPAMIDOL (ISOVUE-370) INJECTION 76% COMPARISON:  Plain film from earlier in the same day, CT of the chest from 04/03/2013. FINDINGS: Cardiovascular: Thoracic aorta is well visualized and demonstrates a normal branching pattern. No aneurysmal dilatation or dissection is seen. The heart is mildly prominent although no evidence of right heart strain is seen. The pulmonary artery demonstrates a normal branching pattern with bilateral filling defects right greater than left consistent with acute pulmonary embolism. These are most notable within the lower lobe on the right. Again no right heart strain is seen. Mediastinum/Nodes: The esophagus is within normal limits. No hilar or mediastinal adenopathy is noted. Thoracic inlet demonstrates a large left thyroid nodule incompletely evaluated on this exam but stable in appearance from the prior CT of 2015 most consistent with a benign thyroid nodule. Lungs/Pleura: Small right pleural effusion is noted. Bibasilar atelectatic changes are seen. Some mild right middle lobe atelectatic changes are noted as well. No sizable nodule is seen. No focal confluent infiltrate is noted. Upper  Abdomen: Visualized upper abdomen is within normal limits. Musculoskeletal: Degenerative changes of the thoracic spine are noted. Review of the MIP images confirms the above findings. IMPRESSION: Bilateral pleural effusion with small right pleural effusion as described. No right heart failure is noted. Mild bibasilar atelectatic changes are seen. Stable left thyroid nodule. Aortic Atherosclerosis (ICD10-I70.0). Critical Value/emergent results were called by telephone at the time of interpretation on 04/26/2018 at 7:35 pm to Dr. Milton Ferguson , who verbally acknowledged these results. Electronically Signed   By: Inez Catalina M.D.   On: 04/26/2018 19:42    Procedures Procedures (including critical care time)  Medications Ordered in ED Medications  iopamidol (ISOVUE-370) 76 % injection 100 mL (100 mLs Intravenous Contrast Given 04/26/18 1848)   CRITICAL CARE Performed by: Milton Ferguson Total critical care time: 40 minutes Critical care time was exclusive of separately billable procedures and treating other patients. Critical care was necessary to treat or prevent imminent or life-threatening deterioration. Critical care was time spent personally by me on the following activities: development of treatment plan with patient and/or surrogate as well as nursing, discussions with consultants, evaluation of patient's response to treatment, examination of patient, obtaining history from patient or surrogate, ordering and performing treatments and interventions, ordering and review of laboratory studies, ordering and review of radiographic studies, pulse oximetry and re-evaluation of patient's condition.   Initial Impression / Assessment and Plan / ED Course  I have reviewed the triage vital signs and the nursing notes.  Pertinent labs & imaging results that were available during my care of the patient were reviewed by me and considered in my medical decision making (see chart for details).   Patient with  bilateral PEs.  No heart strain.  She will be admitted to medicine    Final Clinical Impressions(s) / ED Diagnoses   Final diagnoses:  Chest pain on breathing    ED Discharge Orders    None       Milton Ferguson, MD 04/26/18 1952

## 2018-04-26 NOTE — Progress Notes (Signed)
ANTICOAGULATION CONSULT NOTE - Initial Consult  Pharmacy Consult for heparin dosing Indication:  VTE treatment  No Known Allergies  Patient Measurements: Height: 5\' 3"  (160 cm) Weight: 160 lb (72.6 kg) IBW/kg (Calculated) : 52.4 Heparin Dosing Weight: HEPARIN DW (KG): 67.6   Vital Signs: Temp: 99.1 F (37.3 C) (02/13 1556) Temp Source: Oral (02/13 1556) BP: 136/88 (02/13 1830) Pulse Rate: 68 (02/13 1830)  Labs: Recent Labs    04/26/18 1642  HGB 13.4  HCT 41.9  PLT 285  CREATININE 1.24*    Estimated Creatinine Clearance: 34 mL/min (A) (by C-G formula based on SCr of 1.24 mg/dL (H)).   Medical History: Past Medical History:  Diagnosis Date  . Allergy   . Arthritis   . Colon cancer ([place])    colon ca dx 07/30/09  . History of cardiac monitoring 07/2017   "Event monitor demonstrated sinus rhythm with isolated PACs and no arrhythmias"  . History of colon cancer 06/2009   found at time of TCS 06/29/09, 1.2cm sessile cecal polyp, no adjuvent therapy needed  . HTN (hypertension)   . Hx of cardiovascular stress test 07/2017   "No diagnostic ST segment changes to indicate ischemia. Small, moderate intensity, reversible apical to basal inferolateral defect consistent with ischemia. This is a low risk study. Nuclear stress EF: 84%."  . Hyperlipidemia   . Hypothyroidism   . Renal disorder    cyst on kidney   . Stroke ([employer])   . Vertigo     Medications:  Scheduled:  . [START ON 04/27/2018] aspirin EC  162 mg Oral Daily  . atorvastatin  80 mg Oral q1800  . [START ON 04/27/2018] cycloSPORINE  1 drop Both Eyes BID  . gabapentin  100 mg Oral BID  . [START ON 04/27/2018] levothyroxine  25 mcg Oral Q0600  . [START ON 04/27/2018] linaclotide  72 mcg Oral QAC breakfast  . sodium chloride flush  3 mL Intravenous Q12H    Assessment: Pharmacy consulted to dose heparin for this 84 yo female with VTE.  Patient complains of right-sided chest pain.  She hasn't been on any prior  anti-coagulants and has an elevated D-dimer of 2.96 mcg/mL-FEU.    Goal of Therapy:  Heparin level 0.3-0.7 units/ml Monitor platelets by anticoagulation protocol: Yes   Plan:  Give 3400 units bolus x 1 Start heparin infusion at 1100 units/hr Check anti-Xa level in 6-8 hours and daily while on heparin Continue to monitor H&H and platelets  Despina Pole 04/26/2018,8:09 PM

## 2018-04-27 ENCOUNTER — Observation Stay (HOSPITAL_COMMUNITY): Payer: Medicare Other

## 2018-04-27 DIAGNOSIS — I10 Essential (primary) hypertension: Secondary | ICD-10-CM | POA: Diagnosis not present

## 2018-04-27 DIAGNOSIS — N183 Chronic kidney disease, stage 3 (moderate): Secondary | ICD-10-CM | POA: Diagnosis not present

## 2018-04-27 DIAGNOSIS — J90 Pleural effusion, not elsewhere classified: Secondary | ICD-10-CM | POA: Diagnosis not present

## 2018-04-27 DIAGNOSIS — I2699 Other pulmonary embolism without acute cor pulmonale: Secondary | ICD-10-CM | POA: Diagnosis not present

## 2018-04-27 DIAGNOSIS — E039 Hypothyroidism, unspecified: Secondary | ICD-10-CM | POA: Diagnosis not present

## 2018-04-27 LAB — CBC
HCT: 40.6 % (ref 36.0–46.0)
Hemoglobin: 12.9 g/dL (ref 12.0–15.0)
MCH: 31.2 pg (ref 26.0–34.0)
MCHC: 31.8 g/dL (ref 30.0–36.0)
MCV: 98.3 fL (ref 80.0–100.0)
Platelets: 272 10*3/uL (ref 150–400)
RBC: 4.13 MIL/uL (ref 3.87–5.11)
RDW: 14.9 % (ref 11.5–15.5)
WBC: 9.8 10*3/uL (ref 4.0–10.5)
nRBC: 0 % (ref 0.0–0.2)

## 2018-04-27 LAB — BASIC METABOLIC PANEL
Anion gap: 7 (ref 5–15)
BUN: 16 mg/dL (ref 8–23)
CHLORIDE: 108 mmol/L (ref 98–111)
CO2: 27 mmol/L (ref 22–32)
CREATININE: 1.26 mg/dL — AB (ref 0.44–1.00)
Calcium: 8.9 mg/dL (ref 8.9–10.3)
GFR calc Af Amer: 46 mL/min — ABNORMAL LOW (ref >60)
GFR calc non Af Amer: 40 mL/min — ABNORMAL LOW (ref >60)
Glucose, Bld: 102 mg/dL — ABNORMAL HIGH (ref 70–99)
Potassium: 4.3 mmol/L (ref 3.5–5.1)
SODIUM: 142 mmol/L (ref 135–145)

## 2018-04-27 LAB — HEPARIN LEVEL (UNFRACTIONATED)
Heparin Unfractionated: 1.46 IU/mL — ABNORMAL HIGH (ref 0.30–0.70)
Heparin Unfractionated: 0.68 IU/mL (ref 0.30–0.70)

## 2018-04-27 MED ORDER — HEPARIN (PORCINE) 25000 UT/250ML-% IV SOLN
700.0000 [IU]/h | INTRAVENOUS | Status: DC
  Administered 2018-04-27 – 2018-04-28 (×2): 900 [IU]/h via INTRAVENOUS
  Filled 2018-04-27: qty 250

## 2018-04-27 NOTE — Progress Notes (Signed)
PROGRESS NOTE    [name]  HGD:[id number]  DOB: 02-12-1935  DOA: 04/26/2018 PCP: Alycia Rossetti, MD   Brief Admission Hx: 84 y.o. female with medical history significant for hypertension, chronic kidney disease stage III, hypothyroidism, and history of colon cancer status post right hemicolectomy in 2011,  presented to the emergency department with 1 to 2 days of right-sided pleuritic pain.  MDM/Assessment & Plan:   1. Acute bilateral pulmonary emboli- the patient reports that her pleuritic pain is improving.  She is being anticoagulated fully with IV heparin.  Appreciate the assistance of the pharmacy team.  I would like to continue IV heparin for at least 24 hours and then transition over to oral anticoagulant.  Continue supportive therapy.  Ultrasound of both lower extremities negative for DVT. 2. Stage III CKD- renally dosing medications as appropriate.  She was hydrated overnight after having a contrast CT done.  Follow renal function panel. 3. Essential hypertension- blood pressure is controlled.  Her Lopressor is being temporarily held in the setting of acute PE. 4. Hypothyroidism-resume home levothyroxine.  DVT prophylaxis: IV heparin infusion Code Status: Full Family Communication: Patient updated at bedside and counseled Disposition Plan: Home in 1 to 2 days   Consultants:  N/A  Procedures:  N/A  Antimicrobials:  N/A  Subjective: The patient says her chest pain has improved considerably since she has been on the IV heparin infusion.  Objective: Vitals:   04/27/18 0700 04/27/18 0800 04/27/18 0857 04/27/18 1021  BP:  (!) 142/68 (!) 142/68 (!) 150/62  Pulse: 79 78 82 79  Resp: (!) 21 (!) 24 17 16   Temp:    98.5 F (36.9 C)  TempSrc:    Oral  SpO2: 95% 96% 96% 99%  Weight:      Height:        Intake/Output Summary (Last 24 hours) at 04/27/2018 1235 Last data filed at 04/27/2018 0024 Gross per 24 hour  Intake 169.86 ml  Output -  Net  169.86 ml   Filed Weights   04/26/18 1555  Weight: 72.6 kg     REVIEW OF SYSTEMS  As per history otherwise all reviewed and reported negative  Exam:  General exam: Elderly female appears younger than stated age in no apparent distress. Respiratory system: Bilateral breath sounds clear. No increased work of breathing. Cardiovascular system: Normal S1 & S2 heard. No JVD, murmurs, gallops, clicks or pedal edema. Gastrointestinal system: Abdomen is nondistended, soft and nontender. Normal bowel sounds heard. Central nervous system: Alert and oriented. No focal neurological deficits. Extremities: no CCE.  Data Reviewed: Basic Metabolic Panel: Recent Labs  Lab 04/26/18 1642 04/27/18 0349  NA 138 142  K 3.8 4.3  CL 105 108  CO2 25 27  GLUCOSE 111* 102*  BUN 18 16  CREATININE 1.24* 1.26*  CALCIUM 9.0 8.9   Liver Function Tests: Recent Labs  Lab 04/26/18 1642  AST 17  ALT 23  ALKPHOS 87  BILITOT 0.9  PROT 7.6  ALBUMIN 3.7   No results for input(s): LIPASE, AMYLASE in the last 168 hours. No results for input(s): AMMONIA in the last 168 hours. CBC: Recent Labs  Lab 04/26/18 1642 04/27/18 0349  WBC 8.4 9.8  NEUTROABS 5.8  --   HGB 13.4 12.9  HCT 41.9 40.6  MCV 100.2* 98.3  PLT 285 272   Cardiac Enzymes: Recent Labs  Lab 04/26/18 2014  TROPONINI <0.03   CBG (last 3)  No results for input(s): GLUCAP in  the last 72 hours. No results found for this or any previous visit (from the past 240 hour(s)).   Studies: Dg Chest 2 View  Result Date: 04/26/2018 CLINICAL DATA:  Right-sided chest pain and fever for 2 days. EXAM: CHEST - 2 VIEW COMPARISON:  11/18/2017 FINDINGS: The heart is upper limits of normal in size and stable. There is mild tortuosity of the thoracic aorta. The pulmonary hila appear normal. No worrisome pulmonary lesions. Patchy bibasilar density, likely atelectasis. Could not exclude developing infiltrates. No pleural effusions. IMPRESSION: Streaky  bibasilar atelectasis versus early infiltrates. Electronically Signed   By: Marijo Sanes M.D.   On: 04/26/2018 16:40   Ct Angio Chest Pe W And/or Wo Contrast  Addendum Date: 04/27/2018   ADDENDUM REPORT: 04/27/2018 04:46 ADDENDUM: Called to correct a dictation error in this report read by Dr. Golden Circle. The first impression should read "bilateral PULMONARY EMBOLISM with small right pleural effusion as described." Electronically Signed   By: Monte Fantasia M.D.   On: 04/27/2018 04:46   Result Date: 04/27/2018 CLINICAL DATA:  Shortness of breath and right-sided chest pain, initial encounter EXAM: CT ANGIOGRAPHY CHEST WITH CONTRAST TECHNIQUE: Multidetector CT imaging of the chest was performed using the standard protocol during bolus administration of intravenous contrast. Multiplanar CT image reconstructions and MIPs were obtained to evaluate the vascular anatomy. CONTRAST:  158mL ISOVUE-370 IOPAMIDOL (ISOVUE-370) INJECTION 76% COMPARISON:  Plain film from earlier in the same day, CT of the chest from 04/03/2013. FINDINGS: Cardiovascular: Thoracic aorta is well visualized and demonstrates a normal branching pattern. No aneurysmal dilatation or dissection is seen. The heart is mildly prominent although no evidence of right heart strain is seen. The pulmonary artery demonstrates a normal branching pattern with bilateral filling defects right greater than left consistent with acute pulmonary embolism. These are most notable within the lower lobe on the right. Again no right heart strain is seen. Mediastinum/Nodes: The esophagus is within normal limits. No hilar or mediastinal adenopathy is noted. Thoracic inlet demonstrates a large left thyroid nodule incompletely evaluated on this exam but stable in appearance from the prior CT of 2015 most consistent with a benign thyroid nodule. Lungs/Pleura: Small right pleural effusion is noted. Bibasilar atelectatic changes are seen. Some mild right middle lobe atelectatic  changes are noted as well. No sizable nodule is seen. No focal confluent infiltrate is noted. Upper Abdomen: Visualized upper abdomen is within normal limits. Musculoskeletal: Degenerative changes of the thoracic spine are noted. Review of the MIP images confirms the above findings. IMPRESSION: Bilateral pleural effusion with small right pleural effusion as described. No right heart failure is noted. Mild bibasilar atelectatic changes are seen. Stable left thyroid nodule. Aortic Atherosclerosis (ICD10-I70.0). Critical Value/emergent results were called by telephone at the time of interpretation on 04/26/2018 at 7:35 pm to Dr. Milton Ferguson , who verbally acknowledged these results. Electronically Signed: By: Inez Catalina M.D. On: 04/26/2018 19:42   US Venous Img Lower Bilateral  Result Date: 04/27/2018 CLINICAL DATA:  Diagnosis of pulmonary embolism earlier today. History of colon cancer. Evaluate for DVT. EXAM: BILATERAL LOWER EXTREMITY VENOUS DOPPLER ULTRASOUND TECHNIQUE: Gray-scale sonography with graded compression, as well as color Doppler and duplex ultrasound were performed to evaluate the lower extremity deep venous systems from the level of the common femoral vein and including the common femoral, femoral, profunda femoral, popliteal and calf veins including the posterior tibial, peroneal and gastrocnemius veins when visible. The superficial great saphenous vein was also interrogated. Spectral Doppler was utilized  to evaluate flow at rest and with distal augmentation maneuvers in the common femoral, femoral and popliteal veins. COMPARISON:  Chest CT-04/26/2018 FINDINGS: RIGHT LOWER EXTREMITY Common Femoral Vein: No evidence of thrombus. Normal compressibility, respiratory phasicity and response to augmentation. Saphenofemoral Junction: No evidence of thrombus. Normal compressibility and flow on color Doppler imaging. Profunda Femoral Vein: No evidence of thrombus. Normal compressibility and flow on color  Doppler imaging. Femoral Vein: No evidence of thrombus. Normal compressibility, respiratory phasicity and response to augmentation. Popliteal Vein: No evidence of thrombus. Normal compressibility, respiratory phasicity and response to augmentation. Calf Veins: No evidence of thrombus. Normal compressibility and flow on color Doppler imaging. Superficial Great Saphenous Vein: No evidence of thrombus. Normal compressibility. Venous Reflux:  None. Other Findings:  None. LEFT LOWER EXTREMITY Common Femoral Vein: No evidence of thrombus. Normal compressibility, respiratory phasicity and response to augmentation. Saphenofemoral Junction: No evidence of thrombus. Normal compressibility and flow on color Doppler imaging. Profunda Femoral Vein: No evidence of thrombus. Normal compressibility and flow on color Doppler imaging. Femoral Vein: No evidence of thrombus. Normal compressibility, respiratory phasicity and response to augmentation. Popliteal Vein: No evidence of thrombus. Normal compressibility, respiratory phasicity and response to augmentation. Calf Veins: No evidence of thrombus. Normal compressibility and flow on color Doppler imaging. Superficial Great Saphenous Vein: No evidence of thrombus. Normal compressibility. Venous Reflux:  None. Other Findings:  None. IMPRESSION: No evidence of DVT within either lower extremity. Electronically Signed   By: Sandi Mariscal M.D.   On: 04/27/2018 08:26     Scheduled Meds: . aspirin EC  162 mg Oral Daily  . atorvastatin  80 mg Oral q1800  . cycloSPORINE  1 drop Both Eyes BID  . gabapentin  100 mg Oral BID  . levothyroxine  25 mcg Oral Q0600  . linaclotide  72 mcg Oral QAC breakfast  . sodium chloride flush  3 mL Intravenous Q12H   Continuous Infusions: . heparin 900 Units/hr (04/27/18 1610)    Principal Problem:   Acute pulmonary embolism (HCC) Active Problems:   Essential hypertension   Hypothyroidism   CKD (chronic kidney disease), stage III ([place])  Time  spent:   Irwin Brakeman, MD [facility] 04/27/2018, 12:35 PM    LOS: 0 days  How to contact the [facility] Attending or Consulting provider [place] or covering provider during after hours [place], for this patient?  1. Check the care team in [facility] and look for a) attending/consulting [facility] provider listed and b) the [facility] team listed 2. Log into [email or web address] and use 's universal password to access. If you do not have the password, please contact the hospital operator. 3. Locate the [facility] provider you are looking for under [facility] and page to a number that you can be directly reached. 4. If you still have difficulty reaching the provider, please page the [facility] (Director on Call) for the Hospitalists listed on amion for assistance.

## 2018-04-27 NOTE — Progress Notes (Signed)
ANTICOAGULATION CONSULT NOTE  Pharmacy Consult for Heparin dosing Indication:  VTE treatment  No Known Allergies  Patient Measurements: Height: 5\' 3"  (160 cm) Weight: 160 lb (72.6 kg) IBW/kg (Calculated) : 52.4 Heparin Dosing Weight: HEPARIN DW (KG): 67.6   Vital Signs: BP: 139/67 (02/14 0400) Pulse Rate: 80 (02/14 0400)  Labs: Recent Labs    04/26/18 1642 04/26/18 2014 04/27/18 0349  HGB 13.4  --  12.9  HCT 41.9  --  40.6  PLT 285  --  272  APTT 34  --   --   HEPARINUNFRC <0.10*  --  1.46*  CREATININE 1.24*  --  1.26*  TROPONINI  --  <0.03  --     Estimated Creatinine Clearance: 33.4 mL/min (A) (by C-G formula based on SCr of 1.26 mg/dL (H)).   Medical History: Past Medical History:  Diagnosis Date  . Allergy   . Arthritis   . Colon cancer ([place])    colon ca dx 07/30/09  . History of cardiac monitoring 07/2017   "Event monitor demonstrated sinus rhythm with isolated PACs and no arrhythmias"  . History of colon cancer 06/2009   found at time of TCS 06/29/09, 1.2cm sessile cecal polyp, no adjuvent therapy needed  . HTN (hypertension)   . Hx of cardiovascular stress test 07/2017   "No diagnostic ST segment changes to indicate ischemia. Small, moderate intensity, reversible apical to basal inferolateral defect consistent with ischemia. This is a low risk study. Nuclear stress EF: 84%."  . Hyperlipidemia   . Hypothyroidism   . Renal disorder    cyst on kidney   . Stroke ([place])   . Vertigo     Medications:  Scheduled:  . aspirin EC  162 mg Oral Daily  . atorvastatin  80 mg Oral q1800  . cycloSPORINE  1 drop Both Eyes BID  . gabapentin  100 mg Oral BID  . levothyroxine  25 mcg Oral Q0600  . linaclotide  72 mcg Oral QAC breakfast  . sodium chloride flush  3 mL Intravenous Q12H    Assessment: Pharmacy consulted to dose heparin for this 84 yo female with VTE.  Patient complains of right-sided chest pain.  She hasn't been on any prior anti-coagulants and has an  elevated D-dimer of 2.96 mcg/mL-FEU.  2/14 AM update: heparin level is supra-therapeutic this AM, confirmed with RN that lab was drawn from arm opposite of heparin infusion   Goal of Therapy:  Heparin level 0.3-0.7 units/ml Monitor platelets by anticoagulation protocol: Yes   Plan:  Hold heparin x 1 hr Re-start heparin drip at 900 units/hr at 0630 Re-check heparin level at 1430  Narda Bonds 04/27/2018,5:33 AM

## 2018-04-27 NOTE — ED Notes (Signed)
Pt resting with eyes closed, resp even and non labored, no distress noted,

## 2018-04-27 NOTE — Progress Notes (Signed)
ANTICOAGULATION CONSULT NOTE  Pharmacy Consult for Heparin dosing Indication:  VTE treatment  No Known Allergies  Patient Measurements: Height: 5\' 3"  (160 cm) Weight: 160 lb (72.6 kg) IBW/kg (Calculated) : 52.4 Heparin Dosing Weight: HEPARIN DW (KG): 67.6   Vital Signs: Temp: 99.7 F (37.6 C) (02/14 1402) Temp Source: Oral (02/14 1402) BP: 136/62 (02/14 1402) Pulse Rate: 78 (02/14 1402)  Labs: Recent Labs    04/26/18 1642 04/26/18 2014 04/27/18 0349 04/27/18 1449  HGB 13.4  --  12.9  --   HCT 41.9  --  40.6  --   PLT 285  --  272  --   APTT 34  --   --   --   HEPARINUNFRC <0.10*  --  1.46* 0.68  CREATININE 1.24*  --  1.26*  --   TROPONINI  --  <0.03  --   --     Estimated Creatinine Clearance: 33.4 mL/min (A) (by C-G formula based on SCr of 1.26 mg/dL (H)).   Medical History: Past Medical History:  Diagnosis Date  . Allergy   . Arthritis   . Colon cancer ([place])    colon ca dx 07/30/09  . History of cardiac monitoring 07/2017   "Event monitor demonstrated sinus rhythm with isolated PACs and no arrhythmias"  . History of colon cancer 06/2009   found at time of TCS 06/29/09, 1.2cm sessile cecal polyp, no adjuvent therapy needed  . HTN (hypertension)   . Hx of cardiovascular stress test 07/2017   "No diagnostic ST segment changes to indicate ischemia. Small, moderate intensity, reversible apical to basal inferolateral defect consistent with ischemia. This is a low risk study. Nuclear stress EF: 84%."  . Hyperlipidemia   . Hypothyroidism   . Renal disorder    cyst on kidney   . Stroke ([place])   . Vertigo     Medications:  Scheduled:  . aspirin EC  162 mg Oral Daily  . atorvastatin  80 mg Oral q1800  . cycloSPORINE  1 drop Both Eyes BID  . gabapentin  100 mg Oral BID  . levothyroxine  25 mcg Oral Q0600  . linaclotide  72 mcg Oral QAC breakfast  . sodium chloride flush  3 mL Intravenous Q12H    Assessment: Pharmacy consulted to dose heparin for this 84 yo  female with VTE.  Patient complains of right-sided chest pain.  She hasn't been on any prior anti-coagulants and has an elevated D-dimer of 2.96 mcg/mL-FEU.  Heparin level therapeutic at 0.68   Goal of Therapy:  Heparin level 0.3-0.7 units/ml Monitor platelets by anticoagulation protocol: Yes   Plan:  Continue heparin drip at 900 units/hr Check heparin level daily    [name], PharmD Clinical Pharmacist 04/27/2018 3:54 PM

## 2018-04-27 NOTE — ED Notes (Signed)
Received report on pt, pt lying semi fowler's in bed, no distress noted, update given,

## 2018-04-27 NOTE — Care Management Obs Status (Signed)
[place] NOTIFICATION   Patient Details  Name: [name] MRN: [id number] Date of Birth: 09-05-1934   Medicare Observation Status Notification Given:  Yes    Tommy Medal 04/27/2018, 2:57 PM

## 2018-04-28 DIAGNOSIS — I2699 Other pulmonary embolism without acute cor pulmonale: Secondary | ICD-10-CM | POA: Diagnosis not present

## 2018-04-28 DIAGNOSIS — E039 Hypothyroidism, unspecified: Secondary | ICD-10-CM | POA: Diagnosis not present

## 2018-04-28 DIAGNOSIS — I10 Essential (primary) hypertension: Secondary | ICD-10-CM | POA: Diagnosis not present

## 2018-04-28 DIAGNOSIS — N183 Chronic kidney disease, stage 3 (moderate): Secondary | ICD-10-CM | POA: Diagnosis not present

## 2018-04-28 LAB — CBC
HCT: 39.9 % (ref 36.0–46.0)
Hemoglobin: 12.8 g/dL (ref 12.0–15.0)
MCH: 31.8 pg (ref 26.0–34.0)
MCHC: 32.1 g/dL (ref 30.0–36.0)
MCV: 99 fL (ref 80.0–100.0)
Platelets: 257 10*3/uL (ref 150–400)
RBC: 4.03 MIL/uL (ref 3.87–5.11)
RDW: 14.7 % (ref 11.5–15.5)
WBC: 9.2 10*3/uL (ref 4.0–10.5)
nRBC: 0 % (ref 0.0–0.2)

## 2018-04-28 LAB — RENAL FUNCTION PANEL
ANION GAP: 8 (ref 5–15)
Albumin: 3.1 g/dL — ABNORMAL LOW (ref 3.5–5.0)
BUN: 14 mg/dL (ref 8–23)
CO2: 26 mmol/L (ref 22–32)
Calcium: 8.7 mg/dL — ABNORMAL LOW (ref 8.9–10.3)
Chloride: 107 mmol/L (ref 98–111)
Creatinine, Ser: 1.1 mg/dL — ABNORMAL HIGH (ref 0.44–1.00)
GFR calc Af Amer: 55 mL/min — ABNORMAL LOW (ref >60)
GFR calc non Af Amer: 47 mL/min — ABNORMAL LOW (ref >60)
Glucose, Bld: 90 mg/dL (ref 70–99)
Phosphorus: 3.1 mg/dL (ref 2.5–4.6)
Potassium: 3.7 mmol/L (ref 3.5–5.1)
Sodium: 141 mmol/L (ref 135–145)

## 2018-04-28 LAB — HEPARIN LEVEL (UNFRACTIONATED): Heparin Unfractionated: 1.22 IU/mL — ABNORMAL HIGH (ref 0.30–0.70)

## 2018-04-28 MED ORDER — APIXABAN 5 MG PO TABS: 5.0000 mg | ORAL_TABLET | Freq: Two times a day (BID) | ORAL | Status: DC

## 2018-04-28 MED ORDER — APIXABAN 5 MG PO TABS
10.0000 mg | ORAL_TABLET | Freq: Two times a day (BID) | ORAL | Status: DC
  Administered 2018-04-28: 10 mg via ORAL
  Filled 2018-04-28: qty 2

## 2018-04-28 MED ORDER — ELIQUIS 5 MG VTE STARTER PACK: ORAL_TABLET | ORAL | 0 refills | Status: DC

## 2018-04-28 NOTE — Discharge Summary (Signed)
Physician Discharge Summary  KAILEN NAME BPZ:[id number] DOB: January 08, 1935 DOA: 04/26/2018  PCP: Alycia Rossetti, MD  Admit date: 04/26/2018 Discharge date: 04/28/2018  Admitted From: Home Disposition: Home  Recommendations for Outpatient Follow-up:  1. Follow up with PCP in 1-2 weeks   Discharge Condition: Stable CODE STATUS: Full code Diet recommendation: Heart healthy  Brief/Interim Summary: 84 year old female with a history of hypertension, chronic kidney disease stage III, hypothyroidism, colon cancer status post right hemicolectomy in 2011, presented to the hospital with right-sided pleuritic chest pain.  She is found to have acute bilateral pulmonary emboli on CT angiogram of the chest.  Patient was treated with intravenous heparin.  Lower extremity Dopplers were negative for DVT bilaterally.  Patient shortness of breath and chest pain have overall improved.  She is able to ambulate on room air without worsening shortness of breath.  She has been transitioned to Eliquis.  Her daughter reports that for the past month she has been more sedentary due to problems with her arthritis in her knee.  She has not had any long car ride/plane trips.  She is been advised to follow-up with her primary care physician in the next 1 to 2 weeks.  Will likely need 6 to 12 months of anticoagulation therapy.  Discharge Diagnoses:  Principal Problem:   Acute pulmonary embolism ([place]) Active Problems:   Essential hypertension   Hypothyroidism   CKD (chronic kidney disease), stage III [facility])    Discharge Instructions  Discharge Instructions    Diet - low sodium heart healthy   Complete by:  As directed    Increase activity slowly   Complete by:  As directed      Allergies as of 04/28/2018   No Known Allergies     Medication List    STOP taking these medications   amLODipine 5 MG tablet Commonly known as:  NORVASC     TAKE these medications   acetaminophen 500 MG tablet Commonly  known as:  TYLENOL Take 1,000 mg by mouth every 8 (eight) hours as needed (cold).   aspirin EC 81 MG tablet Take 2 tablets (162 mg total) by mouth daily.   atorvastatin 80 MG tablet Commonly known as:  LIPITOR TAKE (1) TABLET BY MOUTH ONCE A DAY.   DRY EYES OP Apply 1 drop to eye 2 (two) times daily as needed (dry eyes).   ELIQUIS DVT/PE STARTER PACK 5 MG Tabs Take as directed on package: start with two-5mg  tablets twice daily for 7 days. On day 8, switch to one-5mg  tablet twice daily.   gabapentin 100 MG capsule Commonly known as:  NEURONTIN Take 1 in the morning and 2 in the evening   levothyroxine 25 MCG tablet Commonly known as:  SYNTHROID, LEVOTHROID Take 1 tablet (25 mcg total) by mouth daily before breakfast.   linaclotide 72 MCG capsule Commonly known as:  LINZESS TAKE 1 CAPSULE BY MOUTH DAILY BEFORE BREAKFAST.   metoprolol tartrate 25 MG tablet Commonly known as:  LOPRESSOR Take 1 tablet (25 mg total) by mouth 2 (two) times daily.   RESTASIS 0.05 % ophthalmic emulsion Generic drug:  cycloSPORINE Place 1 drop into both eyes 2 (two) times daily.   Vitamin D 50 MCG (2000 UT) Caps Take 1,000 Units by mouth daily.       No Known Allergies  Consultations:     Procedures/Studies: Dg Chest 2 View  Result Date: 04/26/2018 CLINICAL DATA:  Right-sided chest pain and fever for 2 days. EXAM: CHEST -  2 VIEW COMPARISON:  11/18/2017 FINDINGS: The heart is upper limits of normal in size and stable. There is mild tortuosity of the thoracic aorta. The pulmonary hila appear normal. No worrisome pulmonary lesions. Patchy bibasilar density, likely atelectasis. Could not exclude developing infiltrates. No pleural effusions. IMPRESSION: Streaky bibasilar atelectasis versus early infiltrates. Electronically Signed   By: Marijo Sanes M.D.   On: 04/26/2018 16:40   Ct Angio Chest Pe W And/or Wo Contrast  Addendum Date: 04/27/2018   ADDENDUM REPORT: 04/27/2018 04:46 ADDENDUM:  Called to correct a dictation error in this report read by Dr. Golden Circle. The first impression should read "bilateral PULMONARY EMBOLISM with small right pleural effusion as described." Electronically Signed   By: Monte Fantasia M.D.   On: 04/27/2018 04:46   Result Date: 04/27/2018 CLINICAL DATA:  Shortness of breath and right-sided chest pain, initial encounter EXAM: CT ANGIOGRAPHY CHEST WITH CONTRAST TECHNIQUE: Multidetector CT imaging of the chest was performed using the standard protocol during bolus administration of intravenous contrast. Multiplanar CT image reconstructions and MIPs were obtained to evaluate the vascular anatomy. CONTRAST:  176mL ISOVUE-370 IOPAMIDOL (ISOVUE-370) INJECTION 76% COMPARISON:  Plain film from earlier in the same day, CT of the chest from 04/03/2013. FINDINGS: Cardiovascular: Thoracic aorta is well visualized and demonstrates a normal branching pattern. No aneurysmal dilatation or dissection is seen. The heart is mildly prominent although no evidence of right heart strain is seen. The pulmonary artery demonstrates a normal branching pattern with bilateral filling defects right greater than left consistent with acute pulmonary embolism. These are most notable within the lower lobe on the right. Again no right heart strain is seen. Mediastinum/Nodes: The esophagus is within normal limits. No hilar or mediastinal adenopathy is noted. Thoracic inlet demonstrates a large left thyroid nodule incompletely evaluated on this exam but stable in appearance from the prior CT of 2015 most consistent with a benign thyroid nodule. Lungs/Pleura: Small right pleural effusion is noted. Bibasilar atelectatic changes are seen. Some mild right middle lobe atelectatic changes are noted as well. No sizable nodule is seen. No focal confluent infiltrate is noted. Upper Abdomen: Visualized upper abdomen is within normal limits. Musculoskeletal: Degenerative changes of the thoracic spine are noted. Review of  the MIP images confirms the above findings. IMPRESSION: Bilateral pleural effusion with small right pleural effusion as described. No right heart failure is noted. Mild bibasilar atelectatic changes are seen. Stable left thyroid nodule. Aortic Atherosclerosis (ICD10-I70.0). Critical Value/emergent results were called by telephone at the time of interpretation on 04/26/2018 at 7:35 pm to Dr. Milton Ferguson , who verbally acknowledged these results. Electronically Signed: By: Inez Catalina M.D. On: 04/26/2018 19:42   US Venous Img Lower Bilateral  Result Date: 04/27/2018 CLINICAL DATA:  Diagnosis of pulmonary embolism earlier today. History of colon cancer. Evaluate for DVT. EXAM: BILATERAL LOWER EXTREMITY VENOUS DOPPLER ULTRASOUND TECHNIQUE: Gray-scale sonography with graded compression, as well as color Doppler and duplex ultrasound were performed to evaluate the lower extremity deep venous systems from the level of the common femoral vein and including the common femoral, femoral, profunda femoral, popliteal and calf veins including the posterior tibial, peroneal and gastrocnemius veins when visible. The superficial great saphenous vein was also interrogated. Spectral Doppler was utilized to evaluate flow at rest and with distal augmentation maneuvers in the common femoral, femoral and popliteal veins. COMPARISON:  Chest CT-04/26/2018 FINDINGS: RIGHT LOWER EXTREMITY Common Femoral Vein: No evidence of thrombus. Normal compressibility, respiratory phasicity and response to augmentation. Saphenofemoral Junction:  No evidence of thrombus. Normal compressibility and flow on color Doppler imaging. Profunda Femoral Vein: No evidence of thrombus. Normal compressibility and flow on color Doppler imaging. Femoral Vein: No evidence of thrombus. Normal compressibility, respiratory phasicity and response to augmentation. Popliteal Vein: No evidence of thrombus. Normal compressibility, respiratory phasicity and response to  augmentation. Calf Veins: No evidence of thrombus. Normal compressibility and flow on color Doppler imaging. Superficial Great Saphenous Vein: No evidence of thrombus. Normal compressibility. Venous Reflux:  None. Other Findings:  None. LEFT LOWER EXTREMITY Common Femoral Vein: No evidence of thrombus. Normal compressibility, respiratory phasicity and response to augmentation. Saphenofemoral Junction: No evidence of thrombus. Normal compressibility and flow on color Doppler imaging. Profunda Femoral Vein: No evidence of thrombus. Normal compressibility and flow on color Doppler imaging. Femoral Vein: No evidence of thrombus. Normal compressibility, respiratory phasicity and response to augmentation. Popliteal Vein: No evidence of thrombus. Normal compressibility, respiratory phasicity and response to augmentation. Calf Veins: No evidence of thrombus. Normal compressibility and flow on color Doppler imaging. Superficial Great Saphenous Vein: No evidence of thrombus. Normal compressibility. Venous Reflux:  None. Other Findings:  None. IMPRESSION: No evidence of DVT within either lower extremity. Electronically Signed   By: Sandi Mariscal M.D.   On: 04/27/2018 08:26       Subjective: Shortness of breath is improving.  She is able to ambulate without difficulty.  No chest pain.  She does feel generally weak.  Discharge Exam: Vitals:   04/27/18 2153 04/28/18 0602 04/28/18 0930 04/28/18 1320  BP: 138/65 140/76 133/72 132/61  Pulse: 79 74 85 88  Resp: 17 16 16 18   Temp: 98.4 F (36.9 C) 97.9 F (36.6 C) 98.6 F (37 C) 98.8 F (37.1 C)  TempSrc: Oral Oral Oral Oral  SpO2: 98% 99% 100% 100%  Weight:      Height:        General: Pt is alert, awake, not in acute distress Cardiovascular: RRR, S1/S2 +, no rubs, no gallops Respiratory: CTA bilaterally, no wheezing, no rhonchi Abdominal: Soft, NT, ND, bowel sounds + Extremities: no edema, no cyanosis    The results of significant diagnostics from  this hospitalization (including imaging, microbiology, ancillary and laboratory) are listed below for reference.     Microbiology: No results found for this or any previous visit (from the past 240 hour(s)).   Labs: BNP (last 3 results) No results for input(s): BNP in the last 8760 hours. Basic Metabolic Panel: Recent Labs  Lab 04/26/18 1642 04/27/18 0349 04/28/18 0711  NA 138 142 141  K 3.8 4.3 3.7  CL 105 108 107  CO2 25 27 26   GLUCOSE 111* 102* 90  BUN 18 16 14   CREATININE 1.24* 1.26* 1.10*  CALCIUM 9.0 8.9 8.7*  PHOS  --   --  3.1   Liver Function Tests: Recent Labs  Lab 04/26/18 1642 04/28/18 0711  AST 17  --   ALT 23  --   ALKPHOS 87  --   BILITOT 0.9  --   PROT 7.6  --   ALBUMIN 3.7 3.1*   No results for input(s): LIPASE, AMYLASE in the last 168 hours. No results for input(s): AMMONIA in the last 168 hours. CBC: Recent Labs  Lab 04/26/18 1642 04/27/18 0349 04/28/18 0711  WBC 8.4 9.8 9.2  NEUTROABS 5.8  --   --   HGB 13.4 12.9 12.8  HCT 41.9 40.6 39.9  MCV 100.2* 98.3 99.0  PLT 285 272 257   Cardiac  Enzymes: Recent Labs  Lab 04/26/18 2014  TROPONINI <0.03   BNP: Invalid input(s): POCBNP CBG: No results for input(s): GLUCAP in the last 168 hours. D-Dimer Recent Labs    04/26/18 1642  DDIMER 2.96*   Hgb A1c No results for input(s): HGBA1C in the last 72 hours. Lipid Profile No results for input(s): CHOL, HDL, LDLCALC, TRIG, CHOLHDL, LDLDIRECT in the last 72 hours. Thyroid function studies No results for input(s): TSH, T4TOTAL, T3FREE, THYROIDAB in the last 72 hours.  Invalid input(s): FREET3 Anemia work up No results for input(s): VITAMINB12, FOLATE, FERRITIN, TIBC, IRON, RETICCTPCT in the last 72 hours. Urinalysis    Component Value Date/Time   COLORURINE COLORLESS (A) 11/18/2017 0921   APPEARANCEUR CLEAR 11/18/2017 0921   LABSPEC 1.003 (L) 11/18/2017 0921   PHURINE 7.0 11/18/2017 0921   GLUCOSEU NEGATIVE 11/18/2017 0921    HGBUR NEGATIVE 11/18/2017 0921   BILIRUBINUR NEGATIVE 11/18/2017 0921   KETONESUR NEGATIVE 11/18/2017 0921   PROTEINUR NEGATIVE 11/18/2017 0921   UROBILINOGEN 0.2 03/12/2012 0022   NITRITE NEGATIVE 11/18/2017 0921   LEUKOCYTESUR TRACE (A) 11/18/2017 0921   Sepsis Labs Invalid input(s): PROCALCITONIN,  WBC,  LACTICIDVEN Microbiology No results found for this or any previous visit (from the past 240 hour(s)).   Time coordinating discharge: 21mins  SIGNED:   Kathie Dike, MD  [facility] 04/28/2018, 8:13 PM   If 7PM-7AM, please contact night-coverage [email or web address]

## 2018-04-28 NOTE — Progress Notes (Signed)
[place] for apixaban dosing Indication:  PE  No Known Allergies  Patient Measurements: Height: 5\' 3"  (160 cm) Weight: 160 lb (72.6 kg) IBW/kg (Calculated) : 52.4 Heparin Dosing Weight: HEPARIN DW (KG): 67.6   Vital Signs: Temp: 98.8 F (37.1 C) (02/15 1320) Temp Source: Oral (02/15 1320) BP: 132/61 (02/15 1320) Pulse Rate: 88 (02/15 1320)  Labs: Recent Labs    04/26/18 1642 04/26/18 2014 04/27/18 0349 04/27/18 1449 04/28/18 0711  HGB 13.4  --  12.9  --  12.8  HCT 41.9  --  40.6  --  39.9  PLT 285  --  272  --  257  APTT 34  --   --   --   --   HEPARINUNFRC <0.10*  --  1.46* 0.68 1.22*  CREATININE 1.24*  --  1.26*  --  1.10*  TROPONINI  --  <0.03  --   --   --     Estimated Creatinine Clearance: 38.3 mL/min (A) (by C-G formula based on SCr of 1.1 mg/dL (H)).   Medical History: Past Medical History:  Diagnosis Date  . Allergy   . Arthritis   . Colon cancer ([place])    colon ca dx 07/30/09  . History of cardiac monitoring 07/2017   "Event monitor demonstrated sinus rhythm with isolated PACs and no arrhythmias"  . History of colon cancer 06/2009   found at time of TCS 06/29/09, 1.2cm sessile cecal polyp, no adjuvent therapy needed  . HTN (hypertension)   . Hx of cardiovascular stress test 07/2017   "No diagnostic ST segment changes to indicate ischemia. Small, moderate intensity, reversible apical to basal inferolateral defect consistent with ischemia. This is a low risk study. Nuclear stress EF: 84%."  . Hyperlipidemia   . Hypothyroidism   . Renal disorder    cyst on kidney   . Stroke ([place])   . Vertigo     Medications:  Scheduled:  . apixaban  10 mg Oral BID   Followed by  . [START ON 05/05/2018] apixaban  5 mg Oral BID  . aspirin EC  162 mg Oral Daily  . atorvastatin  80 mg Oral q1800  . cycloSPORINE  1 drop Both Eyes BID  . gabapentin  100 mg Oral BID  . levothyroxine  25 mcg Oral Q0600  . linaclotide  72 mcg Oral  QAC breakfast  . sodium chloride flush  3 mL Intravenous Q12H    Assessment: Pharmacy consulted to dose heparin for this 84 yo female with VTE.  Patient complains of right-sided chest pain.  She hasn't been on any prior anti-coagulants and has an elevated D-dimer of 2.96 mcg/mL-FEU.    Goal of Therapy:  Heparin level 0.3-0.7 units/ml Monitor platelets by anticoagulation protocol: Yes   Plan:  Apixaban 10 mg twice daily for 7 days followed by apixaban 5 mg twice daily Monitor labs and s/s of bleeding   [name], PharmD Clinical Pharmacist 04/28/2018 2:43 PM

## 2018-04-28 NOTE — Progress Notes (Signed)
ANTICOAGULATION CONSULT NOTE  Pharmacy Consult for Heparin dosing Indication:  VTE treatment  No Known Allergies  Patient Measurements: Height: 5\' 3"  (160 cm) Weight: 160 lb (72.6 kg) IBW/kg (Calculated) : 52.4 Heparin Dosing Weight: HEPARIN DW (KG): 67.6   Vital Signs: Temp: 97.9 F (36.6 C) (02/15 0602) Temp Source: Oral (02/15 0602) BP: 140/76 (02/15 0602) Pulse Rate: 74 (02/15 0602)  Labs: Recent Labs    04/26/18 1642 04/26/18 2014 04/27/18 0349 04/27/18 1449 04/28/18 0711  HGB 13.4  --  12.9  --  12.8  HCT 41.9  --  40.6  --  39.9  PLT 285  --  272  --  257  APTT 34  --   --   --   --   HEPARINUNFRC <0.10*  --  1.46* 0.68 1.22*  CREATININE 1.24*  --  1.26*  --  1.10*  TROPONINI  --  <0.03  --   --   --     Estimated Creatinine Clearance: 38.3 mL/min (A) (by C-G formula based on SCr of 1.1 mg/dL (H)).   Medical History: Past Medical History:  Diagnosis Date  . Allergy   . Arthritis   . Colon cancer ([place])    colon ca dx 07/30/09  . History of cardiac monitoring 07/2017   "Event monitor demonstrated sinus rhythm with isolated PACs and no arrhythmias"  . History of colon cancer 06/2009   found at time of TCS 06/29/09, 1.2cm sessile cecal polyp, no adjuvent therapy needed  . HTN (hypertension)   . Hx of cardiovascular stress test 07/2017   "No diagnostic ST segment changes to indicate ischemia. Small, moderate intensity, reversible apical to basal inferolateral defect consistent with ischemia. This is a low risk study. Nuclear stress EF: 84%."  . Hyperlipidemia   . Hypothyroidism   . Renal disorder    cyst on kidney   . Stroke ([place])   . Vertigo     Medications:  Scheduled:  . aspirin EC  162 mg Oral Daily  . atorvastatin  80 mg Oral q1800  . cycloSPORINE  1 drop Both Eyes BID  . gabapentin  100 mg Oral BID  . levothyroxine  25 mcg Oral Q0600  . linaclotide  72 mcg Oral QAC breakfast  . sodium chloride flush  3 mL Intravenous Q12H     Assessment: Pharmacy consulted to dose heparin for this 84 yo female with VTE.  Patient complains of right-sided chest pain.  She hasn't been on any prior anti-coagulants and has an elevated D-dimer of 2.96 mcg/mL-FEU.  Heparin level supratherapeutic at 1.22   Goal of Therapy:  Heparin level 0.3-0.7 units/ml Monitor platelets by anticoagulation protocol: Yes   Plan:  Hold heparin infusion x 1 hour Decrease heparin infusion to 700 units/hr Check heparin level in 6-8 hours and daily.   [name], PharmD Clinical Pharmacist 04/28/2018 8:37 AM

## 2018-05-05 ENCOUNTER — Emergency Department (HOSPITAL_COMMUNITY): Payer: Medicare Other

## 2018-05-05 ENCOUNTER — Other Ambulatory Visit: Payer: Self-pay

## 2018-05-05 ENCOUNTER — Encounter (HOSPITAL_COMMUNITY): Payer: Self-pay | Admitting: Emergency Medicine

## 2018-05-05 ENCOUNTER — Emergency Department (HOSPITAL_COMMUNITY)
Admission: EM | Admit: 2018-05-05 | Discharge: 2018-05-05 | Disposition: A | Discharge status: Home / Self Care | Payer: Medicare Other | Attending: Emergency Medicine | Admitting: Emergency Medicine

## 2018-05-05 DIAGNOSIS — N183 Chronic kidney disease, stage 3 (moderate): Secondary | ICD-10-CM | POA: Diagnosis not present

## 2018-05-05 DIAGNOSIS — R9431 Abnormal electrocardiogram [ECG] [EKG]: Secondary | ICD-10-CM | POA: Diagnosis not present

## 2018-05-05 DIAGNOSIS — Z7901 Long term (current) use of anticoagulants: Secondary | ICD-10-CM | POA: Insufficient documentation

## 2018-05-05 DIAGNOSIS — Z7982 Long term (current) use of aspirin: Secondary | ICD-10-CM | POA: Insufficient documentation

## 2018-05-05 DIAGNOSIS — Z79899 Other long term (current) drug therapy: Secondary | ICD-10-CM | POA: Diagnosis not present

## 2018-05-05 DIAGNOSIS — E039 Hypothyroidism, unspecified: Secondary | ICD-10-CM | POA: Diagnosis not present

## 2018-05-05 DIAGNOSIS — R079 Chest pain, unspecified: Secondary | ICD-10-CM | POA: Diagnosis not present

## 2018-05-05 DIAGNOSIS — Z85038 Personal history of other malignant neoplasm of large intestine: Secondary | ICD-10-CM | POA: Diagnosis not present

## 2018-05-05 DIAGNOSIS — Z8673 Personal history of transient ischemic attack (TIA), and cerebral infarction without residual deficits: Secondary | ICD-10-CM | POA: Insufficient documentation

## 2018-05-05 DIAGNOSIS — I129 Hypertensive chronic kidney disease with stage 1 through stage 4 chronic kidney disease, or unspecified chronic kidney disease: Secondary | ICD-10-CM | POA: Insufficient documentation

## 2018-05-05 DIAGNOSIS — R0789 Other chest pain: Secondary | ICD-10-CM | POA: Diagnosis not present

## 2018-05-05 DIAGNOSIS — I2699 Other pulmonary embolism without acute cor pulmonale: Secondary | ICD-10-CM | POA: Diagnosis not present

## 2018-05-05 HISTORY — DX: Other pulmonary embolism without acute cor pulmonale: I26.99

## 2018-05-05 LAB — CBC
HCT: 41 % (ref 36.0–46.0)
Hemoglobin: 12.8 g/dL (ref 12.0–15.0)
MCH: 31.1 pg (ref 26.0–34.0)
MCHC: 31.2 g/dL (ref 30.0–36.0)
MCV: 99.5 fL (ref 80.0–100.0)
Platelets: 412 10*3/uL — ABNORMAL HIGH (ref 150–400)
RBC: 4.12 MIL/uL (ref 3.87–5.11)
RDW: 14.6 % (ref 11.5–15.5)
WBC: 4.2 10*3/uL (ref 4.0–10.5)
nRBC: 0 % (ref 0.0–0.2)

## 2018-05-05 LAB — PROTIME-INR
INR: 1.6
Prothrombin Time: 18.8 seconds — ABNORMAL HIGH (ref 11.4–15.2)

## 2018-05-05 LAB — TROPONIN I: Troponin I: <0.03 ng/mL (ref <0.03)

## 2018-05-05 LAB — BASIC METABOLIC PANEL
Anion gap: 8 (ref 5–15)
BUN: 15 mg/dL (ref 8–23)
CO2: 26 mmol/L (ref 22–32)
CREATININE: 1.12 mg/dL — AB (ref 0.44–1.00)
Calcium: 9.2 mg/dL (ref 8.9–10.3)
Chloride: 107 mmol/L (ref 98–111)
GFR calc non Af Amer: 46 mL/min — ABNORMAL LOW (ref >60)
GFR, EST AFRICAN AMERICAN: 53 mL/min — AB (ref >60)
Glucose, Bld: 87 mg/dL (ref 70–99)
Potassium: 4 mmol/L (ref 3.5–5.1)
Sodium: 141 mmol/L (ref 135–145)

## 2018-05-05 LAB — LACTIC ACID, PLASMA: Lactic Acid, Venous: 0.6 mmol/L (ref 0.5–1.9)

## 2018-05-05 NOTE — Discharge Instructions (Addendum)
Everything looks good today.  Thanks for letting us checking you out.  Continue taking your medicines.  You can use Tylenol every 4 hours if needed for pain.  Return here if needed, for problems.

## 2018-05-05 NOTE — ED Provider Notes (Signed)
[facility] EMERGENCY DEPARTMENT Provider Note   CSN: [id number] Arrival date & time: 05/05/18  1128    History   Chief Complaint Chief Complaint  Patient presents with  . Chest Pain    HPI [name] is a 84 y.o. female.     HPI   Patient presents for evaluation of left-sided chest pain, which started several days ago.  The pain waxes and wane.  Prior to that she had some pain in the right side of her chest, at which time she was diagnosed with pulmonary emboli.  She is taking her prescribed anticoagulant.  She is due to follow-up with her PCP, in 1 week for checkup.  Currently she denies shortness of breath, or dyspnea on exertion.  She is able to walk without dizziness or weakness.  She is eating well.  She denies fever, cough, nausea or vomiting.  There are no other known modifying factors.  Past Medical History:  Diagnosis Date  . Allergy   . Arthritis   . Colon cancer ([place])    colon ca dx 07/30/09  . History of cardiac monitoring 07/2017   "Event monitor demonstrated sinus rhythm with isolated PACs and no arrhythmias"  . History of colon cancer 06/2009   found at time of TCS 06/29/09, 1.2cm sessile cecal polyp, no adjuvent therapy needed  . HTN (hypertension)   . Hx of cardiovascular stress test 07/2017   "No diagnostic ST segment changes to indicate ischemia. Small, moderate intensity, reversible apical to basal inferolateral defect consistent with ischemia. This is a low risk study. Nuclear stress EF: 84%."  . Hyperlipidemia   . Hypothyroidism   . PE (pulmonary thromboembolism) ([place])   . Renal disorder    cyst on kidney   . Stroke ([place])   . Vertigo     Patient Active Problem List   Diagnosis Date Noted  . Acute pulmonary embolism ([place]) 04/26/2018  . Insomnia 12/15/2015  . Paresthesia of left upper and lower extremity 11/21/2015  . Left leg weakness 11/21/2015  . Paresthesias   . Essential hypertension 11/02/2015  . Hypothyroidism 11/02/2015  .  Vertigo 11/02/2015  . CKD (chronic kidney disease), stage III ([place]) 11/02/2015  . Loss of weight 11/02/2015  . OA (osteoarthritis) of knee 11/02/2015  . Colon cancer ([place]) 02/28/2011  . History of colon cancer 06/29/2010  . Constipation 06/10/2009    Past Surgical History:  Procedure Laterality Date  . ABDOMINAL HYSTERECTOMY    . COLON SURGERY  07/2009   right hemicolectomy, no residual colon cancer on path  . COLONOSCOPY  07/16/2010   JGO:[id number] POLYP-TCS 3 YEARS  . COLONOSCOPY N/A 08/02/2013   Procedure: COLONOSCOPY;  Surgeon: Danie Binder, MD;  Location: [facility] SUITE;  Service: Endoscopy;  Laterality: N/A;  9:30  . PARTIAL HYSTERECTOMY    . PARTIAL THYMECTOMY    . partial thyroidectomy     benign tumors     OB History    Gravida  3   Para  2   Term  1   Preterm  1   AB  1   Living        SAB  1   TAB      Ectopic      Multiple      Live Births               Home Medications    Prior to Admission medications   Medication Sig Start Date End Date Taking? Authorizing  Provider  acetaminophen (TYLENOL) 500 MG tablet Take 1,000 mg by mouth every 8 (eight) hours as needed (cold).   Yes [provider]  Artificial Tear Ointment (DRY EYES OP) Apply 1 drop to eye 2 (two) times daily as needed (dry eyes).   Yes [provider]  aspirin EC 81 MG tablet Take 2 tablets (162 mg total) by mouth daily. 03/04/16  Yes [place], Modena Nunnery, MD  atorvastatin (LIPITOR) 80 MG tablet TAKE (1) TABLET BY MOUTH ONCE A DAY. 02/28/18  Yes [place], Modena Nunnery, MD  Cholecalciferol (VITAMIN D) 2000 units CAPS Take 1,000 Units by mouth daily.   Yes [provider]  Arne [place] DVT/PE STARTER PACK (ELIQUIS STARTER PACK) 5 MG TABS Take as directed on package: start with two-5mg  tablets twice daily for 7 days. On day 8, switch to one-5mg  tablet twice daily. 04/28/18  Yes Kathie Dike, MD  gabapentin (NEURONTIN) 100 MG capsule Take 1 in the morning and 2 in the  evening 02/28/18  Yes [place], Modena Nunnery, MD  levothyroxine (SYNTHROID, LEVOTHROID) 25 MCG tablet Take 1 tablet (25 mcg total) by mouth daily before breakfast. 02/28/18  Yes [place], Modena Nunnery, MD  linaclotide (LINZESS) 72 MCG capsule TAKE 1 CAPSULE BY MOUTH DAILY BEFORE BREAKFAST. 02/28/18  Yes [place], Modena Nunnery, MD  metoprolol tartrate (LOPRESSOR) 25 MG tablet Take 1 tablet (25 mg total) by mouth 2 (two) times daily. 02/23/18 02/18/19 Yes [place], Fransisco Hertz, PA-C  RESTASIS 0.05 % ophthalmic emulsion Place 1 drop into both eyes 2 (two) times daily.  05/17/10  Yes [provider]    Family History Family History  Problem Relation Age of Onset  . Colon cancer Mother        >age60  . Arthritis Mother   . Cancer Mother   . Heart disease Mother   . Hyperlipidemia Mother   . Hypertension Mother   . Heart attack Father   . Heart disease Father   . Diabetes Maternal Aunt   . Hyperlipidemia Daughter   . Hypertension Daughter   . Liver disease Neg Hx     Social History Social History   Tobacco Use  . Smoking status: Never Smoker  . Smokeless tobacco: Never Used  Substance Use Topics  . Alcohol use: No    Alcohol/week: 0.0 standard drinks  . Drug use: No     Allergies   Patient has no known allergies.   Review of Systems Review of Systems  All other systems reviewed and are negative.    Physical Exam Updated Vital Signs BP (!) 167/86   Pulse 72   Temp 98.7 F (37.1 C) (Oral)   Resp 17   Ht 5\' 3"  (1.6 m)   Wt 72.6 kg   SpO2 100%   BMI 28.34 kg/m   Physical Exam Vitals signs and nursing note reviewed.  Constitutional:      General: She is not in acute distress.    Appearance: She is well-developed. She is not ill-appearing, toxic-appearing or diaphoretic.     Comments: Elderly, frail  HENT:     Head: Normocephalic and atraumatic.     Right Ear: External ear normal.     Left Ear: External ear normal.     Nose: Nose normal.     Mouth/Throat:     Mouth:  Mucous membranes are moist.     Pharynx: No oropharyngeal exudate.  Eyes:     Conjunctiva/sclera: Conjunctivae normal.     Pupils: Pupils are equal, round,  and reactive to light.  Neck:     Musculoskeletal: Normal range of motion and neck supple.     Trachea: Phonation normal.  Cardiovascular:     Rate and Rhythm: Normal rate and regular rhythm.     Heart sounds: Normal heart sounds.  Pulmonary:     Effort: Pulmonary effort is normal.     Breath sounds: Normal breath sounds.  Chest:     Chest wall: No tenderness.  Abdominal:     General: There is no distension.     Palpations: Abdomen is soft. There is no mass.     Tenderness: There is no abdominal tenderness.  Musculoskeletal: Normal range of motion.  Skin:    General: Skin is warm and dry.  Neurological:     Mental Status: She is alert and oriented to person, place, and time.     Cranial Nerves: No cranial nerve deficit.     Sensory: No sensory deficit.     Motor: No abnormal muscle tone.     Coordination: Coordination normal.  Psychiatric:        Behavior: Behavior normal.        Thought Content: Thought content normal.        Judgment: Judgment normal.      ED Treatments / Results  Labs (all labs ordered are listed, but only abnormal results are displayed) Labs Reviewed  BASIC METABOLIC PANEL - Abnormal; Notable for the following components:      Result Value   Creatinine, Ser 1.12 (*)    GFR calc non Af Amer 46 (*)    GFR calc Af Amer 53 (*)    All other components within normal limits  CBC - Abnormal; Notable for the following components:   Platelets 412 (*)    All other components within normal limits  PROTIME-INR - Abnormal; Notable for the following components:   Prothrombin Time 18.8 (*)    All other components within normal limits  TROPONIN I  LACTIC ACID, PLASMA    EKG EKG Interpretation  Date/Time:  Saturday May 05 2018 11:41:03 EST Ventricular Rate:  64 PR Interval:  160 QRS  Duration: 70 QT Interval:  364 QTC Calculation: 375 R Axis:   -31 Text Interpretation:  Normal sinus rhythm Left axis deviation Minimal voltage criteria for LVH, may be normal variant Anterior infarct , age undetermined Abnormal ECG since last tracing no significant change Confirmed by Daleen Bo [phone]) on 05/05/2018 12:26:50 PM   Radiology Dg Chest 2 View  Result Date: 05/05/2018 CLINICAL DATA:  Chest pain.  Recent pulmonary embolism. EXAM: CHEST - 2 VIEW COMPARISON:  Chest CT and chest radiograph 04/26/2018 FINDINGS: Vague densities at both lung bases are similar to the recent chest radiograph. Heart and mediastinum are stable. Trachea is midline. Negative for a pneumothorax. No large pleural effusions. No acute bone abnormality. IMPRESSION: Persistent patchy basilar chest densities. Findings are similar to the recent chest radiograph from 04/26/2018. Findings likely represent atelectasis or changes from recent pulmonary embolism. Electronically Signed   By: Markus Daft M.D.   On: 05/05/2018 12:35    Procedures Procedures (including critical care time)  Medications Ordered in ED Medications - No data to display   Initial Impression / Assessment and Plan / ED Course  I have reviewed the triage vital signs and the nursing notes.  Pertinent labs & imaging results that were available during my care of the patient were reviewed by me and considered in my medical decision making (see  chart for details).  Clinical Course as of May 05 1333  Sat May 05, 2018  1328 Normal  Troponin I - ONCE - STAT [EW]  1328 Slight elevation troponin  Protime-INR (order if Patient is taking Coumadin / Warfarin)(!) [EW]  1328 Normal  CBC(!) [EW]  1328 Normal  Lactic acid, plasma [EW]  1329 Normal except creatinine slightly elevated GFR low  Basic metabolic panel(!) [EW]  8828 No infiltrate or CHF.  Bilateral lower lobe findings consistent with recent PE.  Images reviewed by me  DG Chest 2 View [EW]     Clinical Course User Index [EW] Daleen Bo, MD        Patient Vitals for the past 24 hrs:  BP Temp Temp src Pulse Resp SpO2 Height Weight  05/05/18 1323 (!) 167/86 - - 72 17 100 % - -  05/05/18 1136 (!) 159/69 98.7 F (37.1 C) Oral 63 18 98 % 5\' 3"  (1.6 m) 72.6 kg    1:35 PM Reevaluation with update and discussion. After initial assessment and treatment, an updated evaluation reveals no change in clinical status.  Findings discussed with patient and daughter, all questions answered. Daleen Bo   Medical Decision Making: Nonspecific chest pain, with recent diagnosis of pulmonary emboli, right greater than left, lower lobes, 05/05/2018.  Discomfort likely represents evolution of changes following pulmonary emboli lodging in the lungs.  Suspect evolution of pulmonary infarcts.  Patient is hemodynamically stable.  No indication for further evaluation treatment, at this time.  Screening labs and imaging are reassuring.  Doubt ACS, additional PE, metabolic instability or impending vascular collapse.  CRITICAL CARE-no Performed by: Daleen Bo    Nursing Notes Reviewed/ Care Coordinated Applicable Imaging Reviewed Interpretation of Laboratory Data incorporated into ED treatment  The patient appears reasonably screened and/or stabilized for discharge and I doubt any other medical condition or other [facility] requiring further screening, evaluation, or treatment in the ED at this time prior to discharge.  Plan: Home Medications-continue current, use Tylenol for pain; Home Treatments-rest, fluids; return here if the recommended treatment, does not improve the symptoms; Recommended follow up-PCP,.      Final Clinical Impressions(s) / ED Diagnoses   Final diagnoses:  Other pulmonary embolism without acute cor pulmonale, unspecified chronicity ([place])  Nonspecific chest pain    ED Discharge Orders    None       Daleen Bo, MD 05/05/18 1335

## 2018-05-05 NOTE — ED Triage Notes (Signed)
Pt states she was recently d/c for bilateral PE, was d/c with blood thinner. Began having a "catching" pain in her L side a few days ago.

## 2018-05-07 ENCOUNTER — Other Ambulatory Visit: Payer: Medicare Other

## 2018-05-08 ENCOUNTER — Encounter: Payer: Self-pay | Admitting: Orthopaedic Surgery

## 2018-05-08 ENCOUNTER — Ambulatory Visit: Payer: Medicare Other | Admitting: Orthopaedic Surgery

## 2018-05-08 VITALS — BP 139/83 | HR 72 | Ht 66.0 in | Wt 158.0 lb

## 2018-05-08 DIAGNOSIS — G8929 Other chronic pain: Secondary | ICD-10-CM

## 2018-05-08 DIAGNOSIS — M25561 Pain in right knee: Secondary | ICD-10-CM

## 2018-05-08 NOTE — Progress Notes (Signed)
CC:  I have pain of my right knee. I would like an injection.  The patient has chronic pain of the right knee.  There is no recent trauma.  There is no redness.  Injections in the past have helped.  The knee has no redness, has an effusion and crepitus present.  ROM of the right knee is 0-105.  Impression:  Chronic knee pain right  Return: 1 month  PROCEDURE NOTE:  The patient requests injections of the right knee , verbal consent was obtained.  The right knee was prepped appropriately after time out was performed.   Sterile technique was observed and injection of 1 cc of Depo-Medrol 40 mg with several cc's of plain xylocaine. Anesthesia was provided by ethyl chloride and a 20-gauge needle was used to inject the knee area. The injection was tolerated well.  A band aid dressing was applied.  The patient was advised to apply ice later today and tomorrow to the injection sight as needed.  Electronically Signed Sanjuana Kava, MD 2/25/20209:54 AM

## 2018-05-11 ENCOUNTER — Inpatient Hospital Stay: Payer: Medicare Other | Admitting: Family Medicine

## 2018-05-11 ENCOUNTER — Ambulatory Visit (INDEPENDENT_AMBULATORY_CARE_PROVIDER_SITE_OTHER): Payer: Medicare Other | Admitting: Family Medicine

## 2018-05-11 ENCOUNTER — Other Ambulatory Visit: Payer: Self-pay

## 2018-05-11 ENCOUNTER — Encounter: Payer: Self-pay | Admitting: Family Medicine

## 2018-05-11 VITALS — BP 130/62 | HR 70 | Temp 98.1°F | Resp 14 | Ht 66.0 in | Wt 156.0 lb

## 2018-05-11 DIAGNOSIS — E039 Hypothyroidism, unspecified: Secondary | ICD-10-CM | POA: Diagnosis not present

## 2018-05-11 DIAGNOSIS — R531 Weakness: Secondary | ICD-10-CM

## 2018-05-11 DIAGNOSIS — I2699 Other pulmonary embolism without acute cor pulmonale: Secondary | ICD-10-CM | POA: Diagnosis not present

## 2018-05-11 DIAGNOSIS — N183 Chronic kidney disease, stage 3 unspecified: Secondary | ICD-10-CM

## 2018-05-11 DIAGNOSIS — M170 Bilateral primary osteoarthritis of knee: Secondary | ICD-10-CM

## 2018-05-11 DIAGNOSIS — H6123 Impacted cerumen, bilateral: Secondary | ICD-10-CM | POA: Diagnosis not present

## 2018-05-11 LAB — T3, FREE: T3, Free: 2.6 pg/mL (ref 2.3–4.2)

## 2018-05-11 LAB — T4, FREE: Free T4: 1.7 ng/dL (ref 0.8–1.8)

## 2018-05-11 LAB — TSH: TSH: 0.03 mIU/L — ABNORMAL LOW (ref 0.40–4.50)

## 2018-05-11 MED ORDER — APIXABAN 5 MG PO TABS: 5.0000 mg | ORAL_TABLET | Freq: Two times a day (BID) | ORAL | 5 refills | Status: DC

## 2018-05-11 NOTE — Progress Notes (Signed)
Subjective:    Patient ID: [name], female    DOB: 20-Oct-1934, 84 y.o.   MRN: [id number]  Patient presents for Hospital F/U (pulmonary emboli)  Patient here for hospital follow-up she was diagnosed with pulmonary embolism bilateral on CT of chest on February 15 after she presented with right-sided pleuritic chest pain.  She was treated with IV heparin and then transitioned to Eliquis.  She has been more little more sedentary taken to her arthritis but no other risk factors for pulmonary embolism.  Was recommended that she at least 6 to 12 months of therapy He is not requiring any oxygen therapy. Back to the emergency room on February 22 with chest pain.  Not have any shortness of breath at that time.  There is no significant changes on her EKG or her labs.  Her pain was likely due to pulmonary infarct in the setting of her known bilateral pulmonary embolism.  To take analgesic Tylenol for pain twice a day  Note No DVT found in lower ext  Pain improved in chest, no cough, no hemopytsis, no difficulty breathing    Norvasc was discontinued     Stated she has had steroid injection placed into her right knee by her orthopedic on 2/25 REviewed last set of labs from 2/22 , Hb normal, Cr better than baseline  1.12   Still feels weak all over, doesn't have any strength in her legs or arms   Due for thyroid recheck ,taking synthroid 50mcg once a day   Review Of Systems:  GEN- denies fatigue, fever, weight loss,+weakness, recent illness HEENT- denies eye drainage, change in vision, nasal discharge, CVS- denies chest pain, palpitations RESP- denies SOB, cough, wheeze ABD- denies N/V, change in stools, abd pain GU- denies dysuria, hematuria, dribbling, incontinence MSK- + joint pain, muscle aches, injury Neuro- denies headache, dizziness, syncope, seizure activity       Objective:    BP 130/62   Pulse 70   Temp 98.1 F (36.7 C) (Oral)   Resp 14   Ht 5\' 6"  (1.676 m)   Wt  156 lb (70.8 kg)   SpO2 99%   BMI 25.18 kg/m  GEN- NAD, alert and oriented x3 HEENT- PERRL, EOMI, non injected sclera, pink conjunctiva, MMM, oropharynx clear, TM clear s/p irrigation of ears- cerumen impaction bilat  Neck- Supple, no thyromegaly CVS- RRR, no murmur RESP-CTAB ABD-NABS,soft,NT,ND MSK- Decreased ROM bilat LE, creptius bilat knee, Fair ROM upper ext, moving all 4 ext, strength decreased in legs and arms 4+/5 EXT- No edema Pulses- Radial,2+        Assessment & Plan:      Problem List Items Addressed This Visit      Unprioritized   Acute pulmonary embolism ([place]) - Primary    Plan to continue eliquis 5mg  BID for at least 6 months, she has unprovoked PE, unknown cause and no DVT Will send to hematology for further guidance as well No travel, no hormones, no known blood disorders Has knee injection but doubt this caused blood clot Discussed bleeding side effects       Relevant Medications   apixaban (ELIQUIS) 5 MG TABS tablet   Other Relevant Orders   Ambulatory referral to Hematology   CKD (chronic kidney disease), stage III ([place])   Hypothyroidism    Based on repeat labs her body does not need the 87mcg So will give a trial off and see how she responds       Relevant Orders  TSH (Completed)   T3, free (Completed)   T4, free (Completed)   OA (osteoarthritis) of knee    Weakness OA knee Would benefit from strength and conditioning with home health PT      Relevant Orders   Ambulatory referral to [place]    Other Visit Diagnoses    Bilateral impacted cerumen       Generalized weakness       Relevant Orders   Ambulatory referral to Home Health      Note: This dictation was prepared with Dragon dictation along with smaller phrase technology. Any transcriptional errors that result from this process are unintentional.

## 2018-05-11 NOTE — Patient Instructions (Addendum)
Referral to physical therapy  Referral to hematology/oncology F/U as previous

## 2018-05-13 ENCOUNTER — Encounter: Payer: Self-pay | Admitting: Family Medicine

## 2018-05-13 NOTE — Assessment & Plan Note (Signed)
Weakness OA knee Would benefit from strength and conditioning with home health PT

## 2018-05-13 NOTE — Assessment & Plan Note (Signed)
Based on repeat labs her body does not need the 90mcg So will give a trial off and see how she responds

## 2018-05-13 NOTE — Assessment & Plan Note (Addendum)
Plan to continue eliquis 5mg  BID for at least 6 months, she has unprovoked PE, unknown cause and no DVT Will send to hematology for further guidance as well No travel, no hormones, no known blood disorders Has knee injection but doubt this caused blood clot Discussed bleeding side effects

## 2018-05-15 ENCOUNTER — Inpatient Hospital Stay (HOSPITAL_COMMUNITY): Payer: Medicare Other | Attending: Internal Medicine | Admitting: Internal Medicine

## 2018-05-15 ENCOUNTER — Encounter (HOSPITAL_COMMUNITY): Payer: Self-pay | Admitting: Internal Medicine

## 2018-05-15 ENCOUNTER — Other Ambulatory Visit: Payer: Self-pay | Admitting: *Deleted

## 2018-05-15 ENCOUNTER — Inpatient Hospital Stay (HOSPITAL_COMMUNITY): Payer: Medicare Other

## 2018-05-15 VITALS — BP 159/65 | HR 65 | Temp 98.7°F | Resp 18 | Wt 157.0 lb

## 2018-05-15 DIAGNOSIS — E039 Hypothyroidism, unspecified: Secondary | ICD-10-CM

## 2018-05-15 DIAGNOSIS — I10 Essential (primary) hypertension: Secondary | ICD-10-CM | POA: Diagnosis not present

## 2018-05-15 DIAGNOSIS — R634 Abnormal weight loss: Secondary | ICD-10-CM

## 2018-05-15 DIAGNOSIS — Z85038 Personal history of other malignant neoplasm of large intestine: Secondary | ICD-10-CM | POA: Insufficient documentation

## 2018-05-15 DIAGNOSIS — I2699 Other pulmonary embolism without acute cor pulmonale: Secondary | ICD-10-CM | POA: Insufficient documentation

## 2018-05-15 DIAGNOSIS — C188 Malignant neoplasm of overlapping sites of colon: Secondary | ICD-10-CM

## 2018-05-15 DIAGNOSIS — D6862 Lupus anticoagulant syndrome: Secondary | ICD-10-CM | POA: Diagnosis not present

## 2018-05-15 DIAGNOSIS — R079 Chest pain, unspecified: Secondary | ICD-10-CM

## 2018-05-15 LAB — CBC WITH DIFFERENTIAL/PLATELET
Abs Immature Granulocytes: 0.02 10*3/uL (ref 0.00–0.07)
BASOS PCT: 1 %
Basophils Absolute: 0.1 10*3/uL (ref 0.0–0.1)
EOS ABS: 0.1 10*3/uL (ref 0.0–0.5)
Eosinophils Relative: 2 %
HCT: 42.4 % (ref 36.0–46.0)
Hemoglobin: 13.1 g/dL (ref 12.0–15.0)
Immature Granulocytes: 0 %
Lymphocytes Relative: 24 %
Lymphs Abs: 1.8 10*3/uL (ref 0.7–4.0)
MCH: 30.7 pg (ref 26.0–34.0)
MCHC: 30.9 g/dL (ref 30.0–36.0)
MCV: 99.3 fL (ref 80.0–100.0)
Monocytes Absolute: 0.6 10*3/uL (ref 0.1–1.0)
Monocytes Relative: 8 %
Neutro Abs: 4.7 10*3/uL (ref 1.7–7.7)
Neutrophils Relative %: 65 %
PLATELETS: 405 10*3/uL — AB (ref 150–400)
RBC: 4.27 MIL/uL (ref 3.87–5.11)
RDW: 15 % (ref 11.5–15.5)
WBC: 7.2 10*3/uL (ref 4.0–10.5)
nRBC: 0 % (ref 0.0–0.2)

## 2018-05-15 LAB — COMPREHENSIVE METABOLIC PANEL
ALT: 18 U/L (ref 0–44)
AST: 16 U/L (ref 15–41)
Albumin: 3.9 g/dL (ref 3.5–5.0)
Alkaline Phosphatase: 86 U/L (ref 38–126)
Anion gap: 8 (ref 5–15)
BUN: 27 mg/dL — ABNORMAL HIGH (ref 8–23)
CO2: 26 mmol/L (ref 22–32)
Calcium: 9.4 mg/dL (ref 8.9–10.3)
Chloride: 107 mmol/L (ref 98–111)
Creatinine, Ser: 1.53 mg/dL — ABNORMAL HIGH (ref 0.44–1.00)
GFR calc Af Amer: 37 mL/min — ABNORMAL LOW (ref >60)
GFR calc non Af Amer: 32 mL/min — ABNORMAL LOW (ref >60)
Glucose, Bld: 106 mg/dL — ABNORMAL HIGH (ref 70–99)
Potassium: 3.7 mmol/L (ref 3.5–5.1)
SODIUM: 141 mmol/L (ref 135–145)
Total Bilirubin: 0.6 mg/dL (ref 0.3–1.2)
Total Protein: 7.7 g/dL (ref 6.5–8.1)

## 2018-05-15 LAB — LACTATE DEHYDROGENASE: LDH: 149 U/L (ref 98–192)

## 2018-05-15 LAB — C-REACTIVE PROTEIN: CRP: <0.8 mg/dL (ref <1.0)

## 2018-05-15 LAB — SEDIMENTATION RATE: Sed Rate: 22 mm/hr (ref 0–22)

## 2018-05-15 NOTE — Progress Notes (Signed)
Referring Physician:  Dr. Buelah Manis  Diagnosis Overlapping malignant neoplasm of colon [facility]) - Plan: CBC with Differential, Comprehensive metabolic panel, Lactate dehydrogenase, Lupus anticoagulant panel, Prothrombin gene mutation, Factor 5 leiden, Beta-2-glycoprotein i abs, IgG/M/A, ANA, IFA (with reflex), Rheumatoid factor, Sedimentation rate, C-reactive protein, Protein electrophoresis, serum  Abnormal weight loss - Plan: CBC with Differential, Comprehensive metabolic panel, Lactate dehydrogenase, Lupus anticoagulant panel, Prothrombin gene mutation, Factor 5 leiden, Beta-2-glycoprotein i abs, IgG/M/A, ANA, IFA (with reflex), Rheumatoid factor, Sedimentation rate, C-reactive protein, Protein electrophoresis, serum, CT ABDOMEN PELVIS W CONTRAST  Other acute pulmonary embolism without acute cor pulmonale (HCC) - Plan: CBC with Differential, Comprehensive metabolic panel, Lactate dehydrogenase, Lupus anticoagulant panel, Prothrombin gene mutation, Factor 5 leiden, Beta-2-glycoprotein i abs, IgG/M/A, ANA, IFA (with reflex), Rheumatoid factor, Sedimentation rate, C-reactive protein, Protein electrophoresis, serum  Staging Cancer Staging Colon cancer (HCC) Staging form: Colon and Rectum, AJCC 7th Edition - Clinical: Stage I (T1, N0, M0) - Unsigned   Assessment and Plan:  1.  Pulmonary embolus.   84 year old female referred for evaluation due to recent diagnosis of pulmonary embolus.  Pt denies recent travel or surgery.  She denies sedentary lifestyle.  She has history of Colon cancer diagnosed in 07/2009. She is status post laparoscopic right hemicolectomy with lysis of adhesions on 07/31/2009 for an adenocarcinoma within a polyp with 0 of 25 lymph nodes sampled having evidence of malignancy. She was previously followed by Dr. Alen Blew.    Surgery done 07/31/2009 with pathology returning as  1. COLON, SEGMENTAL RESECTION, RIGHT : - NO RESIDUAL ADENOCARCINOMA IDENTIFIED. - previous biopsy site with  ulceration, fibrosis and inflammation. - underlying fat necrosis. - margins negative for tumor. - twenty-five benign pericolonic lymph nodes.  Last colonoscopy was done 08/02/2013 and showed 9 polyps and diverticulosis.  Pathology returned as hyperplastic polyp with no malignancy seen.    Pt denies family history of PE or DVT.  She reports mother had heart attack.   Pt presented to hospital on 04/26/2018 with right-sided pleuritic chest pain.  She is found to have acute bilateral pulmonary emboli on CT angiogram of the chest.  Patient was treated with intravenous heparin.  Lower extremity Dopplers were negative for DVT bilaterally.  She was discharged on 04/28/2018 with Eliquis.    She denies bleeding and reports occasional bruising.  She is tolerating Eliquis.  She has been recommended to take Tylenol for chest wall discomfort but is not taking the medication as recommended.  Pt is seen today for consultation due to PE.    Pt will undergo hypercoagulable evaluation and will RTC to go over results.  I discussed with her unprovoked PE is recommended for 6 months of anticoagulation.  She is recommended  for repeat CTA in 10/2018.    Due to [facility] history she is set up for CT abdomen and pelvis for further evaluation.  She will RTC in 2 weeks to go over labs and scan results.     2.  Colorectal cancer.  Pt was diagnosed in 07/2009 with stage 1.  Last colonoscopy was 08/02/2013 and showed 9 polyps and diverticulosis.  Pathology returned as hyperplastic polyp with no malignancy seen.   She was previously followed by Dr. Alen Blew.  She is set up for CT abdomen and pelvis due to recent PE.  She will RTC to go over results.   3.  Chest wall discomfort.  Likely due to recent PE.  Pt advised to use Tylenol or ES tylenol for pain.  4.  Hypertension.  BP is 159/65.  Follow-up with PCP.    5.  Hypothyroidism.  Follow-up with PCP for monitoring.    6.  Health maintenance. Pt had screening mammogram done  02/15/2018 that was negative.  GI follow-up as recommended.  Last colonoscopy was in 08/02/2013 and showed 9 polyps and diverticulosis.  Pathology returned as hyperplastic polyp with no malignancy seen.    40 minutes spent with more than 50% spent in review of records, counseling and coordination of care.    HPI:  84 year old female referred for evaluation due to recent diagnosis of pulmonary embolus.  Pt denies recent travel or surgery.  She denies sedentary lifestyle.  She has history of Colon cancer diagnosed in  07/2009. She is status post laparoscopic right hemicolectomy with lysis of adhesions on 07/31/2009 for an adenocarcinoma within a polyp with 0 of 25 lymph nodes sampled having evidence of malignancy. She was previously followed by Dr. Alen Blew.    Surgery done 07/31/2009 with pathology returning as  1. COLON, SEGMENTAL RESECTION, RIGHT : - NO RESIDUAL ADENOCARCINOMA IDENTIFIED. - previous biopsy site with ulceration, fibrosis and inflammation. - underlying fat necrosis. - margins negative for tumor. - twenty-five benign pericolonic lymph nodes.  Last colonoscopy was done 08/02/2013 and showed 9 polyps and diverticulosis.  Pathology returned as hyperplastic polyp with no malignancy seen.    Pt denies family history of PE or DVT.  She reports mother had heart attack.   Pt presented to hospital on 04/26/2018 with right-sided pleuritic chest pain.  She is found to have acute bilateral pulmonary emboli on CT angiogram of the chest.  Patient was treated with intravenous heparin.  Lower extremity Dopplers were negative for DVT bilaterally.  She was discharged on 04/28/2018 with Eliquis.    She denies bleeding and reports occasional bruising.  She has been recommended to take Tylenol for chest wall discomfort but is not taking the medication as recommended.  Pt is seen today for consultation due to PE.    Problem List Patient Active Problem List   Diagnosis Date Noted  . Acute pulmonary  embolism ([place]) [I26.99] 04/26/2018  . Insomnia [G47.00] 12/15/2015  . Paresthesia of left upper and lower extremity [R20.2] 11/21/2015  . Left leg weakness [R29.898] 11/21/2015  . Paresthesias [R20.2]   . Essential hypertension [I10] 11/02/2015  . Hypothyroidism [E03.9] 11/02/2015  . Vertigo [R42] 11/02/2015  . CKD (chronic kidney disease), stage III ([place]) [N18.3] 11/02/2015  . Loss of weight [R63.4] 11/02/2015  . OA (osteoarthritis) of knee [M17.10] 11/02/2015  . Colon cancer ([place]) [C18.9] 02/28/2011  . History of colon cancer [Z85.038] 06/29/2010  . Constipation [K59.00] 06/10/2009    Past Medical History Past Medical History:  Diagnosis Date  . Allergy   . Arthritis   . Colon cancer ([place])    colon ca dx 07/30/09  . History of cardiac monitoring 07/2017   "Event monitor demonstrated sinus rhythm with isolated PACs and no arrhythmias"  . History of colon cancer 06/2009   found at time of TCS 06/29/09, 1.2cm sessile cecal polyp, no adjuvent therapy needed  . HTN (hypertension)   . Hx of cardiovascular stress test 07/2017   "No diagnostic ST segment changes to indicate ischemia. Small, moderate intensity, reversible apical to basal inferolateral defect consistent with ischemia. This is a low risk study. Nuclear stress EF: 84%."  . Hyperlipidemia   . Hypothyroidism   . PE (pulmonary thromboembolism) ([place])   . Renal disorder  cyst on kidney   . Stroke ([place])   . Vertigo     Past Surgical History Past Surgical History:  Procedure Laterality Date  . ABDOMINAL HYSTERECTOMY    . COLON SURGERY  07/2009   right hemicolectomy, no residual colon cancer on path  . COLONOSCOPY  07/16/2010   OBS:[id number] POLYP-TCS 3 YEARS  . COLONOSCOPY N/A 08/02/2013   Procedure: COLONOSCOPY;  Surgeon: Danie Binder, MD;  Location: [facility];  Service: Endoscopy;  Laterality: N/A;  9:30  . PARTIAL HYSTERECTOMY    . PARTIAL THYMECTOMY    . partial thyroidectomy     benign tumors     Family History Family History  Problem Relation Age of Onset  . Colon cancer Mother        >age60  . Arthritis Mother   . Cancer Mother   . Heart disease Mother   . Hyperlipidemia Mother   . Hypertension Mother   . Heart attack Father   . Heart disease Father   . Diabetes Maternal Aunt   . Hyperlipidemia Daughter   . Hypertension Daughter   . Liver disease Neg Hx      Social History  reports that she has never smoked. She has never used smokeless tobacco. She reports that she does not drink alcohol or use drugs.  Medications  Current Outpatient Medications:  .  acetaminophen (TYLENOL) 500 MG tablet, Take 1,000 mg by mouth every 8 (eight) hours as needed (cold)., Disp: , Rfl:  .  apixaban (ELIQUIS) 5 MG TABS tablet, Take 1 tablet (5 mg total) by mouth 2 (two) times daily., Disp: 60 tablet, Rfl: 5 .  Artificial Tear Ointment (DRY EYES OP), Apply 1 drop to eye 2 (two) times daily as needed (dry eyes)., Disp: , Rfl:  .  aspirin EC 81 MG tablet, Take 2 tablets (162 mg total) by mouth daily., Disp: , Rfl:  .  atorvastatin (LIPITOR) 80 MG tablet, TAKE (1) TABLET BY MOUTH ONCE A DAY., Disp: 90 tablet, Rfl: 2 .  Cholecalciferol (VITAMIN D) 2000 units CAPS, Take 1,000 Units by mouth daily., Disp: , Rfl:  .  gabapentin (NEURONTIN) 100 MG capsule, Take 1 in the morning and 2 in the evening, Disp: 270 capsule, Rfl: 3 .  linaclotide (LINZESS) 72 MCG capsule, TAKE 1 CAPSULE BY MOUTH DAILY BEFORE BREAKFAST., Disp: 90 capsule, Rfl: 1 .  metoprolol tartrate (LOPRESSOR) 25 MG tablet, Take 1 tablet (25 mg total) by mouth 2 (two) times daily., Disp: 180 tablet, Rfl: 3 .  RESTASIS 0.05 % ophthalmic emulsion, Place 1 drop into both eyes 2 (two) times daily. , Disp: , Rfl:   Allergies Patient has no known allergies.  Review of Systems Review of Systems - Oncology ROS negative other than chest wall discomfort due to recent PE.   Physical Exam  Vitals Wt Readings from Last 3 Encounters:   05/15/18 157 lb (71.2 kg)  05/11/18 156 lb (70.8 kg)  05/08/18 158 lb (71.7 kg)   Temp Readings from Last 3 Encounters:  05/15/18 98.7 F (37.1 C) (Oral)  05/11/18 98.1 F (36.7 C) (Oral)  05/05/18 98.7 F (37.1 C) (Oral)   BP Readings from Last 3 Encounters:  05/15/18 (!) 159/65  05/11/18 130/62  05/08/18 139/83   Pulse Readings from Last 3 Encounters:  05/15/18 65  05/11/18 70  05/08/18 72   Constitutional: Well-developed, well-nourished, and in no distress.   HENT: Head: Normocephalic and atraumatic.  Mouth/Throat: No oropharyngeal exudate. Mucosa moist. Eyes:  Pupils are equal, round, and reactive to light. Conjunctivae are normal. No scleral icterus.  Neck: Normal range of motion. Neck supple. No JVD present.  Cardiovascular: Normal rate, regular rhythm and normal heart sounds.  Exam reveals no gallop and no friction rub.   No murmur heard. Pulmonary/Chest: Effort normal and breath sounds normal. No respiratory distress. No wheezes.No rales.  Abdominal: Soft. Bowel sounds are normal. No distension. There is no tenderness. There is no guarding.  Musculoskeletal: No edema or tenderness.  Lymphadenopathy: No cervical,axillary or supraclavicular adenopathy.  Neurological: Alert and oriented to person, place, and time. No cranial nerve deficit.  Skin: Skin is warm and dry. No rash noted. No erythema. No pallor.  Psychiatric: Affect and judgment normal.   Labs Appointment on 05/15/2018  Component Date Value Ref Range Status  . WBC 05/15/2018 7.2  4.0 - 10.5 K/uL Final  . RBC 05/15/2018 4.27  3.87 - 5.11 MIL/uL Final  . Hemoglobin 05/15/2018 13.1  12.0 - 15.0 g/dL Final  . HCT 05/15/2018 42.4  36.0 - 46.0 % Final  . MCV 05/15/2018 99.3  80.0 - 100.0 fL Final  . MCH 05/15/2018 30.7  26.0 - 34.0 pg Final  . MCHC 05/15/2018 30.9  30.0 - 36.0 g/dL Final  . RDW 05/15/2018 15.0  11.5 - 15.5 % Final  . Platelets 05/15/2018 405* 150 - 400 K/uL Final  . nRBC 05/15/2018 0.0  0.0  - 0.2 % Final  . Neutrophils Relative % 05/15/2018 65  % Final  . Neutro Abs 05/15/2018 4.7  1.7 - 7.7 K/uL Final  . Lymphocytes Relative 05/15/2018 24  % Final  . Lymphs Abs 05/15/2018 1.8  0.7 - 4.0 K/uL Final  . Monocytes Relative 05/15/2018 8  % Final  . Monocytes Absolute 05/15/2018 0.6  0.1 - 1.0 K/uL Final  . Eosinophils Relative 05/15/2018 2  % Final  . Eosinophils Absolute 05/15/2018 0.1  0.0 - 0.5 K/uL Final  . Basophils Relative 05/15/2018 1  % Final  . Basophils Absolute 05/15/2018 0.1  0.0 - 0.1 K/uL Final  . Immature Granulocytes 05/15/2018 0  % Final  . Abs Immature Granulocytes 05/15/2018 0.02  0.00 - 0.07 K/uL Final   Performed at [facility], [address] [place], [place] [address]  . Sodium 05/15/2018 141  135 - 145 mmol/L Final  . Potassium 05/15/2018 3.7  3.5 - 5.1 mmol/L Final  . Chloride 05/15/2018 107  98 - 111 mmol/L Final  . CO2 05/15/2018 26  22 - 32 mmol/L Final  . Glucose, Bld 05/15/2018 106* 70 - 99 mg/dL Final  . BUN 05/15/2018 27* 8 - 23 mg/dL Final  . Creatinine, Ser 05/15/2018 1.53* 0.44 - 1.00 mg/dL Final  . Calcium 05/15/2018 9.4  8.9 - 10.3 mg/dL Final  . Total Protein 05/15/2018 7.7  6.5 - 8.1 g/dL Final  . Albumin 05/15/2018 3.9  3.5 - 5.0 g/dL Final  . AST 05/15/2018 16  15 - 41 U/L Final  . ALT 05/15/2018 18  0 - 44 U/L Final  . Alkaline Phosphatase 05/15/2018 86  38 - 126 U/L Final  . Total Bilirubin 05/15/2018 0.6  0.3 - 1.2 mg/dL Final  . GFR calc non Af Amer 05/15/2018 32* >60 mL/min Final  . GFR calc Af Amer 05/15/2018 37* >60 mL/min Final  . Anion gap 05/15/2018 8  5 - 15 Final   Performed at [facility], [address] [place], [place] [address]  . LDH 05/15/2018 149  98 -  192 U/L Final   Performed at [facility], [address] [place], [place] [address]     Pathology Orders Placed This Encounter  Procedures  . CT ABDOMEN PELVIS W CONTRAST    Standing Status:   Future    Standing Expiration Date:   05/15/2019    Order  Specific Question:   If indicated for the ordered procedure, I authorize the administration of contrast media per Radiology protocol    Answer:   Yes    Order Specific Question:   Preferred imaging location?    Answer:   [facility]    Order Specific Question:   Is Oral Contrast requested for this exam?    Answer:   Yes, Per Radiology protocol    Order Specific Question:   Radiology Contrast Protocol - do NOT remove file path    Answer:   [email or web address]  . CBC with Differential    Standing Status:   Future    Number of Occurrences:   1    Standing Expiration Date:   05/15/2019  . Comprehensive metabolic panel    Standing Status:   Future    Number of Occurrences:   1    Standing Expiration Date:   05/15/2019  . Lactate dehydrogenase    Standing Status:   Future    Number of Occurrences:   1    Standing Expiration Date:   05/15/2019  . Lupus anticoagulant panel    Standing Status:   Future    Number of Occurrences:   1    Standing Expiration Date:   05/15/2019  . Prothrombin gene mutation    Standing Status:   Future    Number of Occurrences:   1    Standing Expiration Date:   05/15/2019  . Factor 5 leiden    Standing Status:   Future    Number of Occurrences:   1    Standing Expiration Date:   05/15/2019  . Beta-2-glycoprotein i abs, IgG/M/A    Standing Status:   Future    Number of Occurrences:   1    Standing Expiration Date:   05/15/2019  . ANA, IFA (with reflex)    Standing Status:   Future    Number of Occurrences:   1    Standing Expiration Date:   05/15/2019  . Rheumatoid factor    Standing Status:   Future    Number of Occurrences:   1    Standing Expiration Date:   05/15/2019  . Sedimentation rate    Standing Status:   Future    Number of Occurrences:   1    Standing Expiration Date:   05/15/2019  . C-reactive protein    Standing Status:   Future    Number of Occurrences:   1    Standing Expiration Date:   05/15/2019  . Protein  electrophoresis, serum    Standing Status:   Future    Number of Occurrences:   1    Standing Expiration Date:   05/15/2019       Zoila Shutter MD

## 2018-05-16 LAB — ANTINUCLEAR ANTIBODIES, IFA: ANA Ab, IFA: NEGATIVE

## 2018-05-16 LAB — RHEUMATOID FACTOR

## 2018-05-17 ENCOUNTER — Telehealth: Payer: Self-pay | Admitting: *Deleted

## 2018-05-17 DIAGNOSIS — M6281 Muscle weakness (generalized): Secondary | ICD-10-CM | POA: Diagnosis not present

## 2018-05-17 DIAGNOSIS — Z7982 Long term (current) use of aspirin: Secondary | ICD-10-CM | POA: Diagnosis not present

## 2018-05-17 DIAGNOSIS — Z7901 Long term (current) use of anticoagulants: Secondary | ICD-10-CM | POA: Diagnosis not present

## 2018-05-17 DIAGNOSIS — I2699 Other pulmonary embolism without acute cor pulmonale: Secondary | ICD-10-CM | POA: Diagnosis not present

## 2018-05-17 DIAGNOSIS — M170 Bilateral primary osteoarthritis of knee: Secondary | ICD-10-CM | POA: Diagnosis not present

## 2018-05-17 DIAGNOSIS — N183 Chronic kidney disease, stage 3 (moderate): Secondary | ICD-10-CM | POA: Diagnosis not present

## 2018-05-17 DIAGNOSIS — E039 Hypothyroidism, unspecified: Secondary | ICD-10-CM | POA: Diagnosis not present

## 2018-05-17 LAB — PROTEIN ELECTROPHORESIS, SERUM
A/G RATIO SPE: 1.1 (ref 0.7–1.7)
ALBUMIN ELP: 3.6 g/dL (ref 2.9–4.4)
Alpha-1-Globulin: 0.2 g/dL (ref 0.0–0.4)
Alpha-2-Globulin: 0.7 g/dL (ref 0.4–1.0)
Beta Globulin: 1.2 g/dL (ref 0.7–1.3)
Gamma Globulin: 1.2 g/dL (ref 0.4–1.8)
Globulin, Total: 3.4 g/dL (ref 2.2–3.9)
Total Protein ELP: 7 g/dL (ref 6.0–8.5)

## 2018-05-17 LAB — DRVVT MIX: dRVVT Mix: 65.4 s — ABNORMAL HIGH (ref 0.0–47.0)

## 2018-05-17 LAB — LUPUS ANTICOAGULANT PANEL
DRVVT: 113.4 s — ABNORMAL HIGH (ref 0.0–47.0)
PTT Lupus Anticoagulant: 38.2 s (ref 0.0–51.9)

## 2018-05-17 LAB — BETA-2-GLYCOPROTEIN I ABS, IGG/M/A
Beta-2 Glyco I IgG: <9 GPI IgG units (ref 0–20)
Beta-2-Glycoprotein I IgA: <9 GPI IgA units (ref 0–25)

## 2018-05-17 LAB — DRVVT CONFIRM: dRVVT Confirm: 1.4 ratio — ABNORMAL HIGH (ref 0.8–1.2)

## 2018-05-17 NOTE — Telephone Encounter (Signed)
Received call from [place] PT with [facility]. [phone]~ telephone.   Reports that PT eval has been completed and requested VO to treat 1x weekly x1 week, then 2x weekly x3 weeks, then 1x weekly x2 weeks for lower extremity strengthening, transfers, gait and home exercise plan.   VO given.

## 2018-05-18 NOTE — Telephone Encounter (Signed)
Agree with below. Addendum:    I believe that she has limits with moving and including, toileting, bathing, feeding, dressing and grooming. I believe the power wheelchair is needed for pt to be able to perform ADL's in her home

## 2018-05-21 DIAGNOSIS — Z7901 Long term (current) use of anticoagulants: Secondary | ICD-10-CM | POA: Diagnosis not present

## 2018-05-21 DIAGNOSIS — E039 Hypothyroidism, unspecified: Secondary | ICD-10-CM | POA: Diagnosis not present

## 2018-05-21 DIAGNOSIS — M6281 Muscle weakness (generalized): Secondary | ICD-10-CM | POA: Diagnosis not present

## 2018-05-21 DIAGNOSIS — M170 Bilateral primary osteoarthritis of knee: Secondary | ICD-10-CM | POA: Diagnosis not present

## 2018-05-21 DIAGNOSIS — N183 Chronic kidney disease, stage 3 (moderate): Secondary | ICD-10-CM | POA: Diagnosis not present

## 2018-05-21 DIAGNOSIS — Z7982 Long term (current) use of aspirin: Secondary | ICD-10-CM | POA: Diagnosis not present

## 2018-05-21 DIAGNOSIS — I2699 Other pulmonary embolism without acute cor pulmonale: Secondary | ICD-10-CM | POA: Diagnosis not present

## 2018-05-21 LAB — FACTOR 5 LEIDEN

## 2018-05-21 LAB — PROTHROMBIN GENE MUTATION

## 2018-05-22 ENCOUNTER — Other Ambulatory Visit: Payer: Self-pay | Admitting: *Deleted

## 2018-05-22 MED ORDER — ATORVASTATIN CALCIUM 80 MG PO TABS: ORAL_TABLET | ORAL | 2 refills | Status: DC

## 2018-05-24 DIAGNOSIS — Z7982 Long term (current) use of aspirin: Secondary | ICD-10-CM | POA: Diagnosis not present

## 2018-05-24 DIAGNOSIS — I2699 Other pulmonary embolism without acute cor pulmonale: Secondary | ICD-10-CM | POA: Diagnosis not present

## 2018-05-24 DIAGNOSIS — M170 Bilateral primary osteoarthritis of knee: Secondary | ICD-10-CM | POA: Diagnosis not present

## 2018-05-24 DIAGNOSIS — N183 Chronic kidney disease, stage 3 (moderate): Secondary | ICD-10-CM | POA: Diagnosis not present

## 2018-05-24 DIAGNOSIS — M6281 Muscle weakness (generalized): Secondary | ICD-10-CM | POA: Diagnosis not present

## 2018-05-24 DIAGNOSIS — Z7901 Long term (current) use of anticoagulants: Secondary | ICD-10-CM | POA: Diagnosis not present

## 2018-05-24 DIAGNOSIS — E039 Hypothyroidism, unspecified: Secondary | ICD-10-CM | POA: Diagnosis not present

## 2018-05-28 ENCOUNTER — Other Ambulatory Visit: Payer: Self-pay

## 2018-05-28 ENCOUNTER — Ambulatory Visit (HOSPITAL_COMMUNITY)
Admission: RE | Admit: 2018-05-28 | Discharge: 2018-05-28 | Disposition: A | Discharge status: Home / Self Care | Payer: Medicare Other | Source: Ambulatory Visit | Attending: Internal Medicine | Admitting: Internal Medicine

## 2018-05-28 DIAGNOSIS — R634 Abnormal weight loss: Secondary | ICD-10-CM | POA: Diagnosis present

## 2018-05-28 DIAGNOSIS — C189 Malignant neoplasm of colon, unspecified: Secondary | ICD-10-CM | POA: Diagnosis not present

## 2018-05-28 MED ORDER — IOHEXOL 300 MG/ML  SOLN
100.0000 mL | Freq: Once | INTRAMUSCULAR | Status: AC | PRN
  Administered 2018-05-28: 75 mL via INTRAVENOUS

## 2018-05-29 DIAGNOSIS — M6281 Muscle weakness (generalized): Secondary | ICD-10-CM | POA: Diagnosis not present

## 2018-05-29 DIAGNOSIS — Z7982 Long term (current) use of aspirin: Secondary | ICD-10-CM | POA: Diagnosis not present

## 2018-05-29 DIAGNOSIS — M170 Bilateral primary osteoarthritis of knee: Secondary | ICD-10-CM | POA: Diagnosis not present

## 2018-05-29 DIAGNOSIS — Z7901 Long term (current) use of anticoagulants: Secondary | ICD-10-CM | POA: Diagnosis not present

## 2018-05-29 DIAGNOSIS — E039 Hypothyroidism, unspecified: Secondary | ICD-10-CM | POA: Diagnosis not present

## 2018-05-29 DIAGNOSIS — I2699 Other pulmonary embolism without acute cor pulmonale: Secondary | ICD-10-CM | POA: Diagnosis not present

## 2018-05-29 DIAGNOSIS — N183 Chronic kidney disease, stage 3 (moderate): Secondary | ICD-10-CM | POA: Diagnosis not present

## 2018-05-31 DIAGNOSIS — Z7901 Long term (current) use of anticoagulants: Secondary | ICD-10-CM | POA: Diagnosis not present

## 2018-05-31 DIAGNOSIS — E039 Hypothyroidism, unspecified: Secondary | ICD-10-CM | POA: Diagnosis not present

## 2018-05-31 DIAGNOSIS — M170 Bilateral primary osteoarthritis of knee: Secondary | ICD-10-CM | POA: Diagnosis not present

## 2018-05-31 DIAGNOSIS — Z7982 Long term (current) use of aspirin: Secondary | ICD-10-CM | POA: Diagnosis not present

## 2018-05-31 DIAGNOSIS — M6281 Muscle weakness (generalized): Secondary | ICD-10-CM | POA: Diagnosis not present

## 2018-05-31 DIAGNOSIS — N183 Chronic kidney disease, stage 3 (moderate): Secondary | ICD-10-CM | POA: Diagnosis not present

## 2018-05-31 DIAGNOSIS — I2699 Other pulmonary embolism without acute cor pulmonale: Secondary | ICD-10-CM | POA: Diagnosis not present

## 2018-06-01 ENCOUNTER — Other Ambulatory Visit: Payer: Self-pay

## 2018-06-01 ENCOUNTER — Inpatient Hospital Stay (HOSPITAL_COMMUNITY): Payer: Medicare Other | Admitting: Hematology

## 2018-06-01 ENCOUNTER — Encounter (HOSPITAL_COMMUNITY): Payer: Self-pay | Admitting: Hematology

## 2018-06-01 VITALS — BP 165/71 | HR 64 | Temp 98.9°F | Wt 157.6 lb

## 2018-06-01 DIAGNOSIS — Z85038 Personal history of other malignant neoplasm of large intestine: Secondary | ICD-10-CM | POA: Diagnosis not present

## 2018-06-01 DIAGNOSIS — D6862 Lupus anticoagulant syndrome: Secondary | ICD-10-CM

## 2018-06-01 DIAGNOSIS — I2699 Other pulmonary embolism without acute cor pulmonale: Secondary | ICD-10-CM

## 2018-06-01 DIAGNOSIS — I10 Essential (primary) hypertension: Secondary | ICD-10-CM | POA: Diagnosis not present

## 2018-06-01 DIAGNOSIS — E039 Hypothyroidism, unspecified: Secondary | ICD-10-CM | POA: Diagnosis not present

## 2018-06-01 NOTE — Assessment & Plan Note (Addendum)
1.  Unprovoked pulmonary embolism: - CT PE protocol on 04/26/2018 shows bilateral pulmonary embolism with small right pleural effusion. -Patient was started on Eliquis and is tolerating well. -Hypercoagulable work-up including factor V Leiden and prothrombin gene mutation were negative.  Lupus anticoagulant was positive while patient is on Eliquis.  Beta-2 glycoprotein 1 antibodies were negative. -No evidence of occult malignancy on CT scan of the abdomen and pelvis. - We will evaluate her in 4 months and assess risk-benefit ratio of continuing anticoagulation.  Other alternative is to change her to low intensity anticoagulation with Eliquis 2.5 mg twice daily.  2.  Colon cancer: -Cecal polypectomy on 06/29/2009 with adenocarcinoma arising in a tubular adenoma. -Right colon segmental resection with 0/25 lymph nodes positive and no residual cancer. - Last colonoscopy on 08/02/2013 shows hyperplastic polyp in the rectum and sigmoid colon. - CT abdomen pelvis on 05/28/2018 did not show any evidence of malignancy.  She did not have any significant weight loss.

## 2018-06-01 NOTE — Patient Instructions (Addendum)
[place] at [facility] Discharge Instructions  You were seen today by Dr. Delton Coombes. He went over your recent scan and lab results. Your lupus test was positive but it can be a false positive due to your Eliquis. He discussed your history and family history. He also discussed how you've been feeling lately. He will see you back in 4 months for labs and follow up.   Thank you for choosing [place] at [facility] to provide your oncology and hematology care.  To afford each patient quality time with our provider, please arrive at least 15 minutes before your scheduled appointment time.   If you have a lab appointment with the [place] please come in thru the  Main Entrance and check in at the main information desk  You need to re-schedule your appointment should you arrive 10 or more minutes late.  We strive to give you quality time with our providers, and arriving late affects you and other patients whose appointments are after yours.  Also, if you no show three or more times for appointments you may be dismissed from the clinic at the providers discretion.     Again, thank you for choosing [facility].  Our hope is that these requests will decrease the amount of time that you wait before being seen by our physicians.       _____________________________________________________________  Should you have questions after your visit to [facility], please contact our office at [phone] between the hours of 8:00 a.m. and 4:30 p.m.  Voicemails left after 4:00 p.m. will not be returned until the following business day.  For prescription refill requests, have your pharmacy contact our office and allow 72 hours.    Cancer Center Support Programs:   > Cancer Support Group  2nd Tuesday of the month 1pm-2pm, Journey Room

## 2018-06-01 NOTE — Progress Notes (Signed)
[name], [place] [id number]   CLINIC:  Medical Oncology/Hematology  PCP:  Alycia Rossetti, MD [address] [place] [address] [phone]   REASON FOR VISIT:  Follow-up for Overlapping malignant neoplasm of colon, Pulmonary embolus   BRIEF ONCOLOGIC HISTORY:   No history exists.     CANCER STAGING: Cancer Staging Colon cancer [facility]) Staging form: Colon and Rectum, AJCC 7th Edition - Clinical: Stage I (T1, N0, M0) - Unsigned    INTERVAL HISTORY:  [name] 84 y.o. female returns for routine follow-up. She is here today with her daughter. She states that she is pretty active at home. She has had recent ER visit. She is taking her Eliquis as directed.  Denies any nausea, vomiting, or diarrhea. Denies any new pains. Had not noticed any recent bleeding such as epistaxis, hematuria or hematochezia. Denies recent chest pain on exertion, shortness of breath on minimal exertion, pre-syncopal episodes, or palpitations. Denies any numbness or tingling in hands or feet. Denies any recent fevers, infections, or recent hospitalizations. Patient reports appetite at 100% and energy level at 75%.    REVIEW OF SYSTEMS:  Review of Systems  All other systems reviewed and are negative.    PAST MEDICAL/SURGICAL HISTORY:  Past Medical History:  Diagnosis Date  . Allergy   . Arthritis   . Colon cancer ([place])    colon ca dx 07/30/09  . History of cardiac monitoring 07/2017   "Event monitor demonstrated sinus rhythm with isolated PACs and no arrhythmias"  . History of colon cancer 06/2009   found at time of TCS 06/29/09, 1.2cm sessile cecal polyp, no adjuvent therapy needed  . HTN (hypertension)   . Hx of cardiovascular stress test 07/2017   "No diagnostic ST segment changes to indicate ischemia. Small, moderate intensity, reversible apical to basal inferolateral defect consistent with ischemia. This is a low risk study. Nuclear stress  EF: 84%."  . Hyperlipidemia   . Hypothyroidism   . PE (pulmonary thromboembolism) ([place])   . Renal disorder    cyst on kidney   . Stroke ([place])   . Vertigo    Past Surgical History:  Procedure Laterality Date  . ABDOMINAL HYSTERECTOMY    . COLON SURGERY  07/2009   right hemicolectomy, no residual colon cancer on path  . COLONOSCOPY  07/16/2010   [id number] POLYP-TCS 3 YEARS  . COLONOSCOPY N/A 08/02/2013   Procedure: COLONOSCOPY;  Surgeon: Danie Binder, MD;  Location: [facility] ENDO SUITE;  Service: Endoscopy;  Laterality: N/A;  9:30  . PARTIAL HYSTERECTOMY    . PARTIAL THYMECTOMY    . partial thyroidectomy     benign tumors     SOCIAL HISTORY:  Social History   Socioeconomic History  . Marital status: Widowed    Spouse name: Not on file  . Number of children: 2  . Years of education: Not on file  . Highest education level: Not on file  Occupational History  . Occupation: Psychologist, occupational at [employer]  . 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 Financial resource strain: Not on file  . Food insecurity:    Worry: Not on file    Inability: Not on file  . Transportation needs:    Medical: Not on file    Non-medical: Not on file  Tobacco Use  . Smoking status: Never Smoker  . Smokeless tobacco: Never Used  Substance and Sexual Activity  .  Alcohol use: No    Alcohol/week: 0.0 standard drinks  . Drug use: No  . Sexual activity: Not Currently  Lifestyle  . Physical activity:    Days per week: Not on file    Minutes per session: Not on file  . Stress: Not on file  Relationships  . Social connections:    Talks on phone: Not on file    Gets together: Not on file    Attends religious service: Not on file    Active member of club or organization: Not on file    Attends meetings of clubs or organizations: Not on file    Relationship status: Not on file  . Intimate partner violence:    Fear of current or ex partner: Not on file    Emotionally  abused: Not on file    Physically abused: Not on file    Forced sexual activity: Not on file  Other Topics Concern  . Not on file  Social History Narrative  . Not on file    FAMILY HISTORY:  Family History  Problem Relation Age of Onset  . Colon cancer Mother        >age60  . Arthritis Mother   . Cancer Mother   . Heart disease Mother   . Hyperlipidemia Mother   . Hypertension Mother   . Heart attack Father   . Heart disease Father   . Diabetes Maternal Aunt   . Hyperlipidemia Daughter   . Hypertension Daughter   . Liver disease Neg Hx     CURRENT MEDICATIONS:  Outpatient Encounter Medications as of 06/01/2018  Medication Sig  . acetaminophen (TYLENOL) 500 MG tablet Take 1,000 mg by mouth every 8 (eight) hours as needed (cold).  Marland Kitchen apixaban (ELIQUIS) 5 MG TABS tablet Take 1 tablet (5 mg total) by mouth 2 (two) times daily.  . Artificial Tear Ointment (DRY EYES OP) Apply 1 drop to eye 2 (two) times daily as needed (dry eyes).  Marland Kitchen aspirin EC 81 MG tablet Take 2 tablets (162 mg total) by mouth daily.  Marland Kitchen atorvastatin (LIPITOR) 80 MG tablet TAKE (1) TABLET BY MOUTH ONCE A DAY.  Marland Kitchen Cholecalciferol (VITAMIN D) 2000 units CAPS Take 1,000 Units by mouth daily.  Marland Kitchen gabapentin (NEURONTIN) 100 MG capsule Take 1 in the morning and 2 in the evening  . linaclotide (LINZESS) 72 MCG capsule TAKE 1 CAPSULE BY MOUTH DAILY BEFORE BREAKFAST.  . metoprolol tartrate (LOPRESSOR) 25 MG tablet Take 1 tablet (25 mg total) by mouth 2 (two) times daily.  . RESTASIS 0.05 % ophthalmic emulsion Place 1 drop into both eyes 2 (two) times daily.    No facility-administered encounter medications on file as of 06/01/2018.     ALLERGIES:  No Known Allergies   PHYSICAL EXAM:  ECOG Performance status: 1  Vitals:   06/01/18 1044  BP: (!) 165/71  Pulse: 64  Temp: 98.9 F (37.2 C)  SpO2: 99%   Filed Weights   06/01/18 1044  Weight: 157 lb 9.6 oz (71.5 kg)    Physical Exam Constitutional:       Appearance: Normal appearance.  Cardiovascular:     Rate and Rhythm: Normal rate.  Pulmonary:     Effort: Pulmonary effort is normal.  Abdominal:     Palpations: Abdomen is soft. There is no mass.  Musculoskeletal:     Left lower leg: No edema.  Skin:    General: Skin is warm.  Neurological:     General: No  focal deficit present.     Mental Status: She is alert.  Psychiatric:        Mood and Affect: Mood normal.        Behavior: Behavior normal.      LABORATORY DATA:  I have reviewed the labs as listed.  CBC    Component Value Date/Time   WBC 7.2 05/15/2018 1450   RBC 4.27 05/15/2018 1450   HGB 13.1 05/15/2018 1450   HGB 14.7 04/16/2015 0812   HCT 42.4 05/15/2018 1450   HCT 44.2 04/16/2015 0812   PLT 405 (H) 05/15/2018 1450   PLT 332 04/16/2015 0812   MCV 99.3 05/15/2018 1450   MCV 93.8 04/16/2015 0812   MCH 30.7 05/15/2018 1450   MCHC 30.9 05/15/2018 1450   RDW 15.0 05/15/2018 1450   RDW 15.0 (H) 04/16/2015 0812   LYMPHSABS 1.8 05/15/2018 1450   LYMPHSABS 1.9 04/16/2015 0812   MONOABS 0.6 05/15/2018 1450   MONOABS 0.4 04/16/2015 0812   EOSABS 0.1 05/15/2018 1450   EOSABS 0.2 04/16/2015 0812   BASOSABS 0.1 05/15/2018 1450   BASOSABS 0.1 04/16/2015 0812   CMP Latest Ref Rng & Units 05/15/2018 05/05/2018 04/28/2018  Glucose 70 - 99 mg/dL 106(H) 87 90  BUN 8 - 23 mg/dL 27(H) 15 14  Creatinine 0.44 - 1.00 mg/dL 1.53(H) 1.12(H) 1.10(H)  Sodium 135 - 145 mmol/L 141 141 141  Potassium 3.5 - 5.1 mmol/L 3.7 4.0 3.7  Chloride 98 - 111 mmol/L 107 107 107  CO2 22 - 32 mmol/L 26 26 26   Calcium 8.9 - 10.3 mg/dL 9.4 9.2 8.7(L)  Total Protein 6.5 - 8.1 g/dL 7.7 - -  Total Bilirubin 0.3 - 1.2 mg/dL 0.6 - -  Alkaline Phos 38 - 126 U/L 86 - -  AST 15 - 41 U/L 16 - -  ALT 0 - 44 U/L 18 - -       DIAGNOSTIC IMAGING:  I have independently reviewed the scans and discussed with the patient.   I have reviewed Venita Lick LPN's note and agree with the documentation.  I  personally performed a face-to-face visit, made revisions and my assessment and plan is as follows.    ASSESSMENT & PLAN:   Acute pulmonary embolism ([place]) 1.  Unprovoked pulmonary embolism: - CT PE protocol on 04/26/2018 shows bilateral pulmonary embolism with small right pleural effusion. -Patient was started on Eliquis and is tolerating well. -Hypercoagulable work-up including factor V Leiden and prothrombin gene mutation were negative.  Lupus anticoagulant was positive while patient is on Eliquis.  Beta-2 glycoprotein 1 antibodies were negative. -No evidence of occult malignancy on CT scan of the abdomen and pelvis. - We will evaluate her in 4 months and assess risk-benefit ratio of continuing anticoagulation.  Other alternative is to change her to low intensity anticoagulation with Eliquis 2.5 mg twice daily.  2.  Colon cancer: -Cecal polypectomy on 06/29/2009 with adenocarcinoma arising in a tubular adenoma. -Right colon segmental resection with 0/25 lymph nodes positive and no residual cancer. - Last colonoscopy on 08/02/2013 shows hyperplastic polyp in the rectum and sigmoid colon. - CT abdomen pelvis on 05/28/2018 did not show any evidence of malignancy.  She did not have any significant weight loss.      Orders placed this encounter:  Orders Placed This Encounter  Procedures  . D-dimer, quantitative      Derek Jack, MD [place] [phone]

## 2018-06-04 DIAGNOSIS — M6281 Muscle weakness (generalized): Secondary | ICD-10-CM | POA: Diagnosis not present

## 2018-06-04 DIAGNOSIS — Z7982 Long term (current) use of aspirin: Secondary | ICD-10-CM | POA: Diagnosis not present

## 2018-06-04 DIAGNOSIS — I2699 Other pulmonary embolism without acute cor pulmonale: Secondary | ICD-10-CM | POA: Diagnosis not present

## 2018-06-04 DIAGNOSIS — E039 Hypothyroidism, unspecified: Secondary | ICD-10-CM | POA: Diagnosis not present

## 2018-06-04 DIAGNOSIS — Z7901 Long term (current) use of anticoagulants: Secondary | ICD-10-CM | POA: Diagnosis not present

## 2018-06-04 DIAGNOSIS — M170 Bilateral primary osteoarthritis of knee: Secondary | ICD-10-CM | POA: Diagnosis not present

## 2018-06-04 DIAGNOSIS — N183 Chronic kidney disease, stage 3 (moderate): Secondary | ICD-10-CM | POA: Diagnosis not present

## 2018-06-07 DIAGNOSIS — Z7901 Long term (current) use of anticoagulants: Secondary | ICD-10-CM | POA: Diagnosis not present

## 2018-06-07 DIAGNOSIS — M6281 Muscle weakness (generalized): Secondary | ICD-10-CM | POA: Diagnosis not present

## 2018-06-07 DIAGNOSIS — M170 Bilateral primary osteoarthritis of knee: Secondary | ICD-10-CM | POA: Diagnosis not present

## 2018-06-07 DIAGNOSIS — N183 Chronic kidney disease, stage 3 (moderate): Secondary | ICD-10-CM | POA: Diagnosis not present

## 2018-06-07 DIAGNOSIS — Z7982 Long term (current) use of aspirin: Secondary | ICD-10-CM | POA: Diagnosis not present

## 2018-06-07 DIAGNOSIS — E039 Hypothyroidism, unspecified: Secondary | ICD-10-CM | POA: Diagnosis not present

## 2018-06-07 DIAGNOSIS — I2699 Other pulmonary embolism without acute cor pulmonale: Secondary | ICD-10-CM | POA: Diagnosis not present

## 2018-06-12 ENCOUNTER — Telehealth: Payer: Self-pay | Admitting: *Deleted

## 2018-06-12 ENCOUNTER — Ambulatory Visit: Payer: Medicare Other | Admitting: Orthopaedic Surgery

## 2018-06-12 DIAGNOSIS — I2699 Other pulmonary embolism without acute cor pulmonale: Secondary | ICD-10-CM | POA: Diagnosis not present

## 2018-06-12 DIAGNOSIS — Z7901 Long term (current) use of anticoagulants: Secondary | ICD-10-CM | POA: Diagnosis not present

## 2018-06-12 DIAGNOSIS — M170 Bilateral primary osteoarthritis of knee: Secondary | ICD-10-CM | POA: Diagnosis not present

## 2018-06-12 DIAGNOSIS — M6281 Muscle weakness (generalized): Secondary | ICD-10-CM | POA: Diagnosis not present

## 2018-06-12 DIAGNOSIS — N183 Chronic kidney disease, stage 3 (moderate): Secondary | ICD-10-CM | POA: Diagnosis not present

## 2018-06-12 DIAGNOSIS — Z7982 Long term (current) use of aspirin: Secondary | ICD-10-CM | POA: Diagnosis not present

## 2018-06-12 DIAGNOSIS — E039 Hypothyroidism, unspecified: Secondary | ICD-10-CM | POA: Diagnosis not present

## 2018-06-12 NOTE — Telephone Encounter (Signed)
Advise pt if her BP is fine and HR is not going fast, this is normal to hear HR If she has concerns, recommend she call her cardiologist

## 2018-06-12 NOTE — Telephone Encounter (Signed)
Call placed to patient and patient made aware.  

## 2018-06-12 NOTE — Telephone Encounter (Signed)
Received call from patient. [phone]~ telephone.  Reports that she has hearing disturbance in her ears. States that it sounds like she is hearing her heart beat in B ears. Reports that there is no pain or drainage. States that pounding is so lound that it occasionally wakes her up from sleep.  MD please advise.

## 2018-06-14 ENCOUNTER — Telehealth: Payer: Self-pay | Admitting: Cardiovascular Disease

## 2018-06-14 NOTE — Telephone Encounter (Signed)
Patient would like to speak with nurse. States that she can "hear her heart beating". Spoke with PCP was advised by them to call heart doctor. / tg

## 2018-06-14 NOTE — Telephone Encounter (Signed)
As per B. Strader's last office note, [name] is on Lopressor 25 mg bid and has already been instructed to take an extra 25 mg prn. It appears her symptoms are confined to the ear rather than actual palpitations. I would advise [name] first see Dr. Benjamine Mola.

## 2018-06-14 NOTE — Telephone Encounter (Signed)
She said she would try anything.She will take an extra 25 mg lopressor and has put in a call to Dr.Teoh

## 2018-06-14 NOTE — Telephone Encounter (Signed)
Patient hears a "bumping" sound in her left ear for the last several months. Saw Dr.[place], had wax removed from both ears, noted left inner ear was red. Was told to call us if noise was fast, which it is not. Says she has vertigo and sees Dr.Teoh but doesn't feel that is it. She is going to make an apt to see him though. Denies palpitations, just this noise.

## 2018-06-20 DIAGNOSIS — Z7901 Long term (current) use of anticoagulants: Secondary | ICD-10-CM | POA: Diagnosis not present

## 2018-06-20 DIAGNOSIS — Z7982 Long term (current) use of aspirin: Secondary | ICD-10-CM | POA: Diagnosis not present

## 2018-06-20 DIAGNOSIS — M6281 Muscle weakness (generalized): Secondary | ICD-10-CM | POA: Diagnosis not present

## 2018-06-20 DIAGNOSIS — N183 Chronic kidney disease, stage 3 (moderate): Secondary | ICD-10-CM | POA: Diagnosis not present

## 2018-06-20 DIAGNOSIS — E039 Hypothyroidism, unspecified: Secondary | ICD-10-CM | POA: Diagnosis not present

## 2018-06-20 DIAGNOSIS — I2699 Other pulmonary embolism without acute cor pulmonale: Secondary | ICD-10-CM | POA: Diagnosis not present

## 2018-06-20 DIAGNOSIS — M170 Bilateral primary osteoarthritis of knee: Secondary | ICD-10-CM | POA: Diagnosis not present

## 2018-07-10 ENCOUNTER — Ambulatory Visit: Payer: Medicare Other | Admitting: Orthopaedic Surgery

## 2018-07-11 ENCOUNTER — Ambulatory Visit: Payer: Medicare Other | Admitting: Orthopaedic Surgery

## 2018-07-18 ENCOUNTER — Encounter: Payer: Self-pay | Admitting: Gastroenterology

## 2018-07-19 ENCOUNTER — Other Ambulatory Visit: Payer: Self-pay

## 2018-07-19 ENCOUNTER — Encounter: Payer: Self-pay | Admitting: Orthopaedic Surgery

## 2018-07-19 ENCOUNTER — Ambulatory Visit: Payer: Medicare Other | Admitting: Orthopaedic Surgery

## 2018-07-19 VITALS — Temp 96.0°F

## 2018-07-19 DIAGNOSIS — G8929 Other chronic pain: Secondary | ICD-10-CM

## 2018-07-19 DIAGNOSIS — M25561 Pain in right knee: Secondary | ICD-10-CM

## 2018-07-19 NOTE — Progress Notes (Signed)
CC:  I have pain of my right knee. I would like an injection.  The patient has chronic pain of the right knee.  There is no recent trauma.  There is no redness.  Injections in the past have helped.  The knee has no redness, has an effusion and crepitus present.  ROM of the right knee is 0-105.  Impression:  Chronic knee pain right  Return: as needed  PROCEDURE NOTE:  The patient requests injections of the right knee , verbal consent was obtained.  The right knee was prepped appropriately after time out was performed.   Sterile technique was observed and injection of 1 cc of Depo-Medrol 40 mg with several cc's of plain xylocaine. Anesthesia was provided by ethyl chloride and a 20-gauge needle was used to inject the knee area. The injection was tolerated well.  A band aid dressing was applied.  The patient was advised to apply ice later today and tomorrow to the injection sight as needed.  Electronically Signed Sanjuana Kava, MD 5/7/20209:11 AM

## 2018-08-08 ENCOUNTER — Emergency Department (HOSPITAL_COMMUNITY): Payer: Medicare Other

## 2018-08-08 ENCOUNTER — Encounter (HOSPITAL_COMMUNITY): Payer: Self-pay | Admitting: Emergency Medicine

## 2018-08-08 ENCOUNTER — Other Ambulatory Visit: Payer: Self-pay

## 2018-08-08 ENCOUNTER — Emergency Department (HOSPITAL_COMMUNITY)
Admission: EM | Admit: 2018-08-08 | Discharge: 2018-08-08 | Disposition: A | Discharge status: Home / Self Care | Payer: Medicare Other | Attending: Emergency Medicine | Admitting: Emergency Medicine

## 2018-08-08 DIAGNOSIS — R079 Chest pain, unspecified: Secondary | ICD-10-CM

## 2018-08-08 DIAGNOSIS — I6300 Cerebral infarction due to thrombosis of unspecified precerebral artery: Secondary | ICD-10-CM

## 2018-08-08 DIAGNOSIS — Z7901 Long term (current) use of anticoagulants: Secondary | ICD-10-CM | POA: Diagnosis not present

## 2018-08-08 DIAGNOSIS — M542 Cervicalgia: Secondary | ICD-10-CM

## 2018-08-08 DIAGNOSIS — Z8673 Personal history of transient ischemic attack (TIA), and cerebral infarction without residual deficits: Secondary | ICD-10-CM | POA: Diagnosis not present

## 2018-08-08 DIAGNOSIS — I129 Hypertensive chronic kidney disease with stage 1 through stage 4 chronic kidney disease, or unspecified chronic kidney disease: Secondary | ICD-10-CM | POA: Diagnosis not present

## 2018-08-08 DIAGNOSIS — E039 Hypothyroidism, unspecified: Secondary | ICD-10-CM | POA: Insufficient documentation

## 2018-08-08 DIAGNOSIS — N183 Chronic kidney disease, stage 3 (moderate): Secondary | ICD-10-CM | POA: Diagnosis not present

## 2018-08-08 DIAGNOSIS — M25512 Pain in left shoulder: Secondary | ICD-10-CM | POA: Diagnosis not present

## 2018-08-08 DIAGNOSIS — Z7982 Long term (current) use of aspirin: Secondary | ICD-10-CM | POA: Diagnosis not present

## 2018-08-08 DIAGNOSIS — M546 Pain in thoracic spine: Secondary | ICD-10-CM | POA: Diagnosis not present

## 2018-08-08 DIAGNOSIS — Z79899 Other long term (current) drug therapy: Secondary | ICD-10-CM | POA: Insufficient documentation

## 2018-08-08 DIAGNOSIS — M79602 Pain in left arm: Secondary | ICD-10-CM | POA: Diagnosis not present

## 2018-08-08 LAB — URINALYSIS, ROUTINE W REFLEX MICROSCOPIC
Bilirubin Urine: NEGATIVE
Glucose, UA: NEGATIVE mg/dL
Hgb urine dipstick: NEGATIVE
Ketones, ur: NEGATIVE mg/dL
Nitrite: NEGATIVE
Protein, ur: NEGATIVE mg/dL
Specific Gravity, Urine: 1.004 — ABNORMAL LOW (ref 1.005–1.030)
pH: 7 (ref 5.0–8.0)

## 2018-08-08 LAB — CBC WITH DIFFERENTIAL/PLATELET
Abs Immature Granulocytes: 0.01 10*3/uL (ref 0.00–0.07)
Basophils Absolute: 0.1 10*3/uL (ref 0.0–0.1)
Basophils Relative: 1 %
Eosinophils Absolute: 0.2 10*3/uL (ref 0.0–0.5)
Eosinophils Relative: 4 %
HCT: 44.7 % (ref 36.0–46.0)
Hemoglobin: 14.4 g/dL (ref 12.0–15.0)
Immature Granulocytes: 0 %
Lymphocytes Relative: 35 %
Lymphs Abs: 1.7 10*3/uL (ref 0.7–4.0)
MCH: 32 pg (ref 26.0–34.0)
MCHC: 32.2 g/dL (ref 30.0–36.0)
MCV: 99.3 fL (ref 80.0–100.0)
Monocytes Absolute: 0.4 10*3/uL (ref 0.1–1.0)
Monocytes Relative: 7 %
Neutro Abs: 2.5 10*3/uL (ref 1.7–7.7)
Neutrophils Relative %: 53 %
Platelets: 275 10*3/uL (ref 150–400)
RBC: 4.5 MIL/uL (ref 3.87–5.11)
RDW: 14.4 % (ref 11.5–15.5)
WBC: 4.8 10*3/uL (ref 4.0–10.5)
nRBC: 0 % (ref 0.0–0.2)

## 2018-08-08 LAB — COMPREHENSIVE METABOLIC PANEL
ALT: 20 U/L (ref 0–44)
AST: 20 U/L (ref 15–41)
Albumin: 3.8 g/dL (ref 3.5–5.0)
Alkaline Phosphatase: 95 U/L (ref 38–126)
Anion gap: 12 (ref 5–15)
BUN: 18 mg/dL (ref 8–23)
CO2: 24 mmol/L (ref 22–32)
Calcium: 9.4 mg/dL (ref 8.9–10.3)
Chloride: 106 mmol/L (ref 98–111)
Creatinine, Ser: 1.3 mg/dL — ABNORMAL HIGH (ref 0.44–1.00)
GFR calc Af Amer: 45 mL/min — ABNORMAL LOW (ref >60)
GFR calc non Af Amer: 38 mL/min — ABNORMAL LOW (ref >60)
Glucose, Bld: 112 mg/dL — ABNORMAL HIGH (ref 70–99)
Potassium: 3.8 mmol/L (ref 3.5–5.1)
Sodium: 142 mmol/L (ref 135–145)
Total Bilirubin: 0.8 mg/dL (ref 0.3–1.2)
Total Protein: 7.6 g/dL (ref 6.5–8.1)

## 2018-08-08 LAB — TROPONIN I: Troponin I: <0.03 ng/mL (ref <0.03)

## 2018-08-08 LAB — D-DIMER, QUANTITATIVE: D-Dimer, Quant: <0.27 ug/mL-FEU (ref 0.00–0.50)

## 2018-08-08 MED ORDER — ACETAMINOPHEN 500 MG PO TABS
1000.0000 mg | ORAL_TABLET | Freq: Once | ORAL | Status: AC
  Administered 2018-08-08: 1000 mg via ORAL
  Filled 2018-08-08: qty 2

## 2018-08-08 NOTE — ED Triage Notes (Signed)
PT c/o left upper back/shoulder pain radiating into her left arm (dull) with pulsing feeling in her the left side of her neck.

## 2018-08-08 NOTE — ED Provider Notes (Signed)
[facility] EMERGENCY DEPARTMENT Provider Note   CSN: [id number] Arrival date & time: 08/08/18  0754    History   Chief Complaint Chief Complaint  Patient presents with  . Arm Pain    HPI [name] is a 84 y.o. female.     Pt presents to the ED today with some pain to her left neck.  She said the pain sometimes radiates down her left arm.  She called her pcp who told her to call her cardiologist.  She called her cardiologist who told her to call PCP.  She did not know what else to do.  She woke up this morning and sx were worse.  She denies cp.  No sob.  She has a hx of PE and has been compliant with her Eliquis.  No known exposures to Covid.     Past Medical History:  Diagnosis Date  . Allergy   . Arthritis   . Colon cancer ([place])    colon ca dx 07/30/09  . History of cardiac monitoring 07/2017   "Event monitor demonstrated sinus rhythm with isolated PACs and no arrhythmias"  . History of colon cancer 06/2009   found at time of TCS 06/29/09, 1.2cm sessile cecal polyp, no adjuvent therapy needed  . HTN (hypertension)   . Hx of cardiovascular stress test 07/2017   "No diagnostic ST segment changes to indicate ischemia. Small, moderate intensity, reversible apical to basal inferolateral defect consistent with ischemia. This is a low risk study. Nuclear stress EF: 84%."  . Hyperlipidemia   . Hypothyroidism   . PE (pulmonary thromboembolism) ([place])   . Renal disorder    cyst on kidney   . Stroke ([place])   . Vertigo     Patient Active Problem List   Diagnosis Date Noted  . Acute pulmonary embolism ([place]) 04/26/2018  . Insomnia 12/15/2015  . Paresthesia of left upper and lower extremity 11/21/2015  . Left leg weakness 11/21/2015  . Paresthesias   . Essential hypertension 11/02/2015  . Hypothyroidism 11/02/2015  . Vertigo 11/02/2015  . CKD (chronic kidney disease), stage III ([place]) 11/02/2015  . Loss of weight 11/02/2015  . OA (osteoarthritis) of knee  11/02/2015  . Colon cancer ([place]) 02/28/2011  . History of colon cancer 06/29/2010  . Constipation 06/10/2009    Past Surgical History:  Procedure Laterality Date  . ABDOMINAL HYSTERECTOMY    . COLON SURGERY  07/2009   right hemicolectomy, no residual colon cancer on path  . COLONOSCOPY  07/16/2010   [id number] POLYP-TCS 3 YEARS  . COLONOSCOPY N/A 08/02/2013   Procedure: COLONOSCOPY;  Surgeon: Danie Binder, MD;  Location: [facility] ENDO SUITE;  Service: Endoscopy;  Laterality: N/A;  9:30  . PARTIAL HYSTERECTOMY    . PARTIAL THYMECTOMY    . partial thyroidectomy     benign tumors     OB History    Gravida  3   Para  2   Term  1   Preterm  1   AB  1   Living        SAB  1   TAB      Ectopic      Multiple      Live Births               Home Medications    Prior to Admission medications   Medication Sig Start Date End Date Taking? Authorizing Provider  acetaminophen (TYLENOL) 500 MG tablet Take 1,000 mg by mouth every  8 (eight) hours as needed (cold).    [provider]  apixaban (ELIQUIS) 5 MG TABS tablet Take 1 tablet (5 mg total) by mouth 2 (two) times daily. 05/11/18   Alycia Rossetti, MD  Artificial Tear Ointment (DRY EYES OP) Apply 1 drop to eye 2 (two) times daily as needed (dry eyes).    [provider]  aspirin EC 81 MG tablet Take 2 tablets (162 mg total) by mouth daily. 03/04/16   Alycia Rossetti, MD  atorvastatin (LIPITOR) 80 MG tablet TAKE (1) TABLET BY MOUTH ONCE A DAY. 05/22/18   Alycia Rossetti, MD  Cholecalciferol (VITAMIN D) 2000 units CAPS Take 1,000 Units by mouth daily.    [provider]  gabapentin (NEURONTIN) 100 MG capsule Take 1 in the morning and 2 in the evening 02/28/18   [place], Modena Nunnery, MD  linaclotide [facility]) 72 MCG capsule TAKE 1 CAPSULE BY MOUTH DAILY BEFORE BREAKFAST. 02/28/18   Alycia Rossetti, MD  metoprolol tartrate (LOPRESSOR) 25 MG tablet Take 1 tablet (25 mg total) by mouth 2 (two)  times daily. 02/23/18 02/18/19  Strader, Fransisco Hertz, PA-C  RESTASIS 0.05 % ophthalmic emulsion Place 1 drop into both eyes 2 (two) times daily.  05/17/10   [provider]    Family History Family History  Problem Relation Age of Onset  . Colon cancer Mother        >age60  . Arthritis Mother   . Cancer Mother   . Heart disease Mother   . Hyperlipidemia Mother   . Hypertension Mother   . Heart attack Father   . Heart disease Father   . Diabetes Maternal Aunt   . Hyperlipidemia Daughter   . Hypertension Daughter   . Liver disease Neg Hx     Social History Social History   Tobacco Use  . Smoking status: Never Smoker  . Smokeless tobacco: Never Used  Substance Use Topics  . Alcohol use: No    Alcohol/week: 0.0 standard drinks  . Drug use: No     Allergies   Patient has no known allergies.   Review of Systems Review of Systems  Musculoskeletal:       Left arm pain  All other systems reviewed and are negative.    Physical Exam Updated Vital Signs BP (!) 167/86   Pulse 61   Temp 97.7 F (36.5 C) (Oral)   Resp 16   Ht 5\' 3"  (1.6 m)   Wt 71.2 kg   SpO2 97%   BMI 27.81 kg/m   Physical Exam Vitals signs and nursing note reviewed.  Constitutional:      Appearance: Normal appearance.  HENT:     Head: Normocephalic and atraumatic.     Right Ear: External ear normal.     Left Ear: External ear normal.     Nose: Nose normal.     Mouth/Throat:     Mouth: Mucous membranes are moist.     Pharynx: Oropharynx is clear.  Eyes:     Extraocular Movements: Extraocular movements intact.     Conjunctiva/sclera: Conjunctivae normal.     Pupils: Pupils are equal, round, and reactive to light.  Neck:     Musculoskeletal: Normal range of motion and neck supple.  Cardiovascular:     Rate and Rhythm: Normal rate and regular rhythm.     Pulses: Normal pulses.     Heart sounds: Normal heart sounds.  Pulmonary:     Effort: Pulmonary effort is  normal.     Breath  sounds: Normal breath sounds.  Abdominal:     General: Abdomen is flat. Bowel sounds are normal.     Palpations: Abdomen is soft.  Musculoskeletal: Normal range of motion.  Skin:    General: Skin is warm.     Capillary Refill: Capillary refill takes less than 2 seconds.  Neurological:     General: No focal deficit present.     Mental Status: She is alert and oriented to person, place, and time.  Psychiatric:        Mood and Affect: Mood normal.        Behavior: Behavior normal.      ED Treatments / Results  Labs (all labs ordered are listed, but only abnormal results are displayed) Labs Reviewed  COMPREHENSIVE METABOLIC PANEL - Abnormal; Notable for the following components:      Result Value   Glucose, Bld 112 (*)    Creatinine, Ser 1.30 (*)    GFR calc non Af Amer 38 (*)    GFR calc Af Amer 45 (*)    All other components within normal limits  URINALYSIS, ROUTINE W REFLEX MICROSCOPIC - Abnormal; Notable for the following components:   Color, Urine COLORLESS (*)    Specific Gravity, Urine 1.004 (*)    Leukocytes,Ua TRACE (*)    Bacteria, UA RARE (*)    All other components within normal limits  CBC WITH DIFFERENTIAL/PLATELET  TROPONIN I  D-DIMER, QUANTITATIVE (NOT AT [facility])    EKG EKG Interpretation  Date/Time:  Wednesday Aug 08 2018 08:10:50 EDT Ventricular Rate:  69 PR Interval:    QRS Duration: 84 QT Interval:  365 QTC Calculation: 391 R Axis:   -38 Text Interpretation:  Sinus rhythm Left axis deviation Borderline T wave abnormalities No significant change since last tracing Confirmed by Isla Pence [phone]) on 08/08/2018 8:16:13 AM   Radiology Dg Chest 1 View  Result Date: 08/08/2018 CLINICAL DATA:  Left upper back/shoulder pain EXAM: CHEST  1 VIEW COMPARISON:  05/05/2018 FINDINGS: Mild scarring/atelectasis at the lung bases. No focal consolidation. No pleural effusion or pneumothorax. The heart is normal in size. IMPRESSION: No evidence of acute  cardiopulmonary disease. Electronically Signed   By: Julian Hy M.D.   On: 08/08/2018 08:48   Ct Cervical Spine Wo Contrast  Result Date: 08/08/2018 CLINICAL DATA:  Woke up this morning with left-sided neck pain. Negative trauma history EXAM: CT CERVICAL SPINE WITHOUT CONTRAST TECHNIQUE: Multidetector CT imaging of the cervical spine was performed without intravenous contrast. Multiplanar CT image reconstructions were also generated. COMPARISON:  11/18/2017 FINDINGS: Alignment: 3 mm of anterolisthesis at C4-5, degenerative. Slight retrolisthesis at C3-4 Skull base and vertebrae: No acute fracture. No primary bone lesion or focal pathologic process. Soft tissues and spinal canal: Atrophic or resected right thyroid gland. Nodular and enlarged left thyroid, stable. Disc levels: C2-3: Bulky right-sided spur or ossification from the medial right facet with right impingement of the foramen. C3-4: Disc narrowing and central disc protrusion. The canal and foramina are patent C4-5: Bulky degenerative facet spurring on the right with anterolisthesis. Moderate or advanced right foraminal stenosis. C5-6: Spondylosis.  No bony impingement C6-7: Spondylosis.  No bony impingement C7-T1:Unremarkable. Upper chest: Negative IMPRESSION: 1. No emergent finding. 2. C4-5 advanced right facet arthropathy with anterolisthesis and right foraminal stenosis. 3. C2-3 right foraminal impingement. 4. No bony impingement in the symptomatic left neck. Electronically Signed   By: Monte Fantasia M.D.   On: 08/08/2018 09:09  Procedures Procedures (including critical care time)  Medications Ordered in ED Medications  acetaminophen (TYLENOL) tablet 1,000 mg (1,000 mg Oral Given 08/08/18 0948)     Initial Impression / Assessment and Plan / ED Course  I have reviewed the triage vital signs and the nursing notes.  Pertinent labs & imaging results that were available during my care of the patient were reviewed by me and  considered in my medical decision making (see chart for details).       Pt does have some foraminal impingement on the right, but not the left.  Trop ok and ddimer ok.  Pt has been complaint with her eliquis.  I did order an outpatient carotid artery [place].  Last one was nl in 2017.  Pt instructed to take tylenol/ibuprofen as needed for pain.   I did speak with her daughter (with pt's permission) with an update.  Pt knows to return if worse.  F/u with pcp.  Final Clinical Impressions(s) / ED Diagnoses   Final diagnoses:  Neck pain    ED Discharge Orders         Ordered    US Carotid Duplex Bilateral     08/08/18 0944           Isla Pence, MD 08/08/18 1039

## 2018-08-14 ENCOUNTER — Other Ambulatory Visit: Payer: Self-pay | Admitting: Pharmacist

## 2018-08-14 ENCOUNTER — Other Ambulatory Visit: Payer: Self-pay

## 2018-08-14 NOTE — Patient Outreach (Signed)
[facility]) Care Management  08/14/2018  [name] 09-Jan-1935 [id number]   Referral Date: 08/13/2018 Referral Source: [facility] referral Referral Reason: Eliquis, Linzess, and Restatsis eye drops   Outreach Attempt: Spoke with patient. She is able to verify HIPAA.  Discussed reason for referral. She states that she has problems affording her medications.  She is unable to give cost of medications as she states that she has an account that she charges to with her pharmacy and pays on it monthly.  She states her daughter helps her when she can.    Social: Patient lives alone but states that her daughter is with her frequently.  She states she is independent with care and uses a cane occasionally   Conditions: Patient admits to arthritis, HTN, and some kidney disease.     Medications: Patient takes her medications as prescribed but has problems affording medications.     Appointments: Patient due to see PCP within the week.    Advanced Directives: Patient does have advanced directive.   Consent: Discussed [facility] services.  Patient agreeable to pharmacy services at this time.     Plan: RN CM will refer to pharmacy for medication assistance.     Jone Baseman, RN, MSN [facility], [place] Care Management Care Management Coordinator Direct Line [phone] Toll Free: [phone]  Fax: [phone]

## 2018-08-14 NOTE — Patient Outreach (Signed)
[facility]) Care Management  [place]   08/14/2018  [name] [birth year] [id number]  Reason for referral: Medication Assistance with Eliquis, Linzess, Restasis  Referral source: [facility] Department Current insurance: [facility]  PMHx includes but not limited to:  Hx PE on anticoagulation, HTN, HLD, hypothyroidism, CKD-III, OA, hx stroke  Outreach:  Unsuccessful telephone call attempt #1 to patient. HIPAA compliant voicemail left requesting a return call  Plan:  -I will mail patient an unsuccessful outreach letter.  -I will make another outreach attempt to patient within 3-4 business days.    Ralene Bathe, PharmD, [place] [phone]

## 2018-08-16 ENCOUNTER — Other Ambulatory Visit: Payer: Self-pay

## 2018-08-16 ENCOUNTER — Ambulatory Visit (INDEPENDENT_AMBULATORY_CARE_PROVIDER_SITE_OTHER): Payer: Medicare Other | Admitting: Family Medicine

## 2018-08-16 ENCOUNTER — Encounter: Payer: Self-pay | Admitting: Family Medicine

## 2018-08-16 VITALS — BP 120/78 | HR 71 | Temp 99.0°F | Resp 18 | Ht 63.0 in | Wt 159.2 lb

## 2018-08-16 DIAGNOSIS — M5030 Other cervical disc degeneration, unspecified cervical region: Secondary | ICD-10-CM

## 2018-08-16 DIAGNOSIS — I10 Essential (primary) hypertension: Secondary | ICD-10-CM

## 2018-08-16 DIAGNOSIS — R29898 Other symptoms and signs involving the musculoskeletal system: Secondary | ICD-10-CM

## 2018-08-16 DIAGNOSIS — K5901 Slow transit constipation: Secondary | ICD-10-CM

## 2018-08-16 DIAGNOSIS — E785 Hyperlipidemia, unspecified: Secondary | ICD-10-CM

## 2018-08-16 DIAGNOSIS — M170 Bilateral primary osteoarthritis of knee: Secondary | ICD-10-CM | POA: Diagnosis not present

## 2018-08-16 DIAGNOSIS — E782 Mixed hyperlipidemia: Secondary | ICD-10-CM | POA: Insufficient documentation

## 2018-08-16 DIAGNOSIS — R2681 Unsteadiness on feet: Secondary | ICD-10-CM

## 2018-08-16 NOTE — Progress Notes (Signed)
   Subjective:    Patient ID: [name], female    DOB: Dec 02, 1934, 84 y.o.   MRN: [id number]  Patient presents for Hospitalization Follow-up (arm, back, and neck pain)   Recurrent left neck pain/ post shoulder pain and into her arm.  Seen at ER on 5/27  CT neck showed DDD changes, no specific nerve impignemnt, feels a little better depending on which way she moves   Took extra metoprolol to help hearing extra heartbeat which helped   Morning and and during rest time often has,   Getting carotid US  Monday June  8th to check this as well   Had ear discomfort - needs checked again, planning to see Dr. Benjamine Mola as well  HTN- home readings are good   Constiaton- using Linzess few days a week, due to soft stools but has large volume   Lipitor pill getting hung to due to size   Knees and legs continue to give her problems, legs get weak, feels she may fall, was doing well with PT in past  has had knee injections, walking with cane and assistance from daughter    Review Of Systems:  GEN- denies fatigue, fever, weight loss,weakness, recent illness HEENT- denies eye drainage, change in vision, nasal discharge, CVS- denies chest pain, palpitations RESP- denies SOB, cough, wheeze ABD- denies N/V, change in stools, abd pain GU- denies dysuria, hematuria, dribbling, incontinence MSK- + joint pain, +muscle aches, injury Neuro- denies headache, dizziness, syncope, seizure activity       Objective:    BP 120/78   Pulse 71   Temp 99 F (37.2 C)   Resp 18   Ht 5\' 3"  (1.6 m)   Wt 159 lb 3.2 oz (72.2 kg)   SpO2 98%   BMI 28.20 kg/m  GEN- NAD, alert and oriented x3 HEENT- PERRL, EOMI, non injected sclera, pink conjunctiva, MMM, oropharynx clear, TM clear left side, no erythema, wax blocking right ear  Neck- Supple, no thyromegaly, fair ROM CVS- RRR, no murmur RESP-CTAB ABD-NABS,soft,NT,ND MSK- Decreased ROM bilat UE and LE, creptius bilat knee, Fair ROM upper ext, moving  all 4 ext, strength decreased in legs and arms 4+/5, unstable gait  EXT- No edema Pulses- Radial,2+        Assessment & Plan:      Problem List Items Addressed This Visit      Unprioritized   Constipation    linzess 2-3 times a week to control bowels       DDD (degenerative disc disease), cervical    Will set up for Home PT For gait instability Knee pain, neck pain- ROM and strengthining She is not the best surgical candidate       Relevant Orders   Ambulatory referral to [place] hypertension    Controlled no changes      Hyperlipemia    Will contact pharmacy about smaller size pill if possible      Left leg weakness   OA (osteoarthritis) of knee - Primary   Relevant Orders   Ambulatory referral to Home Health    Other Visit Diagnoses    Gait instability       Relevant Orders   Ambulatory referral to Home Health      Note: This dictation was prepared with Dragon dictation along with smaller phrase technology. Any transcriptional errors that result from this process are unintentional.

## 2018-08-16 NOTE — Assessment & Plan Note (Signed)
Will set up for Home PT For gait instability Knee pain, neck pain- ROM and strengthining She is not the best surgical candidate

## 2018-08-16 NOTE — Assessment & Plan Note (Signed)
linzess 2-3 times a week to control bowels

## 2018-08-16 NOTE — Patient Instructions (Addendum)
Physical therapy referral  We will call pharmacy about the lipitor due to the size  Cancel appt for next week F/U 4 months- 30 minute slot

## 2018-08-16 NOTE — Assessment & Plan Note (Signed)
Will contact pharmacy about smaller size pill if possible

## 2018-08-16 NOTE — Assessment & Plan Note (Signed)
Controlled no changes 

## 2018-08-20 ENCOUNTER — Other Ambulatory Visit: Payer: Self-pay

## 2018-08-20 ENCOUNTER — Other Ambulatory Visit: Payer: Self-pay | Admitting: Pharmacist

## 2018-08-20 ENCOUNTER — Ambulatory Visit (HOSPITAL_COMMUNITY)
Admission: RE | Admit: 2018-08-20 | Discharge: 2018-08-20 | Disposition: A | Discharge status: Home / Self Care | Payer: Medicare Other | Source: Ambulatory Visit | Attending: Emergency Medicine | Admitting: Emergency Medicine

## 2018-08-20 ENCOUNTER — Ambulatory Visit: Payer: Self-pay | Admitting: Pharmacist

## 2018-08-20 DIAGNOSIS — I6300 Cerebral infarction due to thrombosis of unspecified precerebral artery: Secondary | ICD-10-CM

## 2018-08-20 DIAGNOSIS — I6523 Occlusion and stenosis of bilateral carotid arteries: Secondary | ICD-10-CM | POA: Diagnosis not present

## 2018-08-20 NOTE — Patient Outreach (Signed)
[place] [facility]) Care Management  [place]   08/20/2018  [name] February 15, 1935 [id number]  Reason for referral: Medication Assistance with Eliquis, Linzess, Restasis  Referral source: [facility] Department Current insurance: [facility]  PMHx includes but not limited to:  Hx PE on anticoagulation, HTN, HLD, hypothyroidism, CKD-III, OA, hx stroke  Outreach:  Successful telephone call with [name].  HIPAA identifiers verified.   Subjective:  Patient reports she uses a weekly pillbox for organizing her medications and no issues with adherence.  She reports she recently started taking Eliquis in February and is not having any concerns with bleeding.  She reports she has an appt with her neurologist tomorrow.  She believes that she used to have Extra Help LIS but thinks it stopped this year.  She has been receiving money from deceased spouse's accounts for the last 8 years but states this will end after 2020. She is not sure what her exact income is currently.   Objective:  Lab Results  Component Value Date   CREATININE 1.30 (H) 08/08/2018   CREATININE 1.53 (H) 05/15/2018   CREATININE 1.12 (H) 05/05/2018    Lab Results  Component Value Date   HGBA1C 5.7 (H) 11/22/2015    Lipid Panel     Component Value Date/Time   CHOL 131 02/28/2018 0924   TRIG 79 02/28/2018 0924   HDL 48 (L) 02/28/2018 0924   CHOLHDL 2.7 02/28/2018 0924   VLDL 10 11/04/2016 0854   LDLCALC 67 02/28/2018 0924    BP Readings from Last 3 Encounters:  08/16/18 120/78  08/08/18 (!) 167/86  06/01/18 (!) 165/71    No Known Allergies  Medications Reviewed Today    Reviewed by Alycia Rossetti, MD (Physician) on 08/16/18 at 2028  Med List Status: <None>  Medication Order Taking? Sig Documenting Provider Last Dose Status Informant  acetaminophen (TYLENOL) 500 MG tablet [id number] Yes Take 1,000 mg by mouth every 8  (eight) hours as needed (cold). [provider] Taking Active Self  apixaban (ELIQUIS) 5 MG TABS tablet [id number] Yes Take 1 tablet (5 mg total) by mouth 2 (two) times daily. Alycia Rossetti, MD Taking Active   Artificial Tear Ointment (DRY EYES OP) [id number] Yes Apply 1 drop to eye 2 (two) times daily as needed (dry eyes). [provider] Taking Active Self  aspirin EC 81 MG tablet [id number] Yes Take 2 tablets (162 mg total) by mouth daily. Alycia Rossetti, MD Taking Active Self  atorvastatin (LIPITOR) 80 MG tablet [id number] Yes TAKE (1) TABLET BY MOUTH ONCE A DAY. Alycia Rossetti, MD Taking Active   Cholecalciferol (VITAMIN D) 2000 units CAPS [id number] Yes Take 1,000 Units by mouth daily. [provider] Taking Active Self  gabapentin (NEURONTIN) 100 MG capsule [id number] Yes Take 1 in the morning and 2 in the evening Corley, Modena Nunnery, MD Taking Active Self  linaclotide (LINZESS) 72 MCG capsule [id number] Yes TAKE 1 CAPSULE BY MOUTH DAILY BEFORE BREAKFAST. [place], Modena Nunnery, MD Taking Active Self  metoprolol tartrate (LOPRESSOR) 25 MG tablet [id number] Yes Take 1 tablet (25 mg total) by mouth 2 (two) times daily. Erma Heritage, PA-C Taking Active Self  RESTASIS 0.05 % ophthalmic emulsion [id number] Yes Place 1 drop into both eyes 2 (two) times daily.  [provider] Taking Active Self          Assessment: Drugs sorted by system:  Neurologic/Psychologic:gabapentin  Hematologic: apixaban, aspirin 151m  Cardiovascular: atorvastatin, metoprolol  Gastrointestinal:linaclotide  Topical: artifical tears, cyclosporin eye drops  Pain: acetaminophen  Vitamins/Minerals/Supplements: cholecalciferol  Medication Review Findings:   Aspirin 134m + apixaban:  Counseled patient to ask neurologist tomorrow about this dose as aspirin dose may need to be reduced.  This was started before patient on apixaban and patient has not had f/u with neurologist  since it was added.     Medication Assistance Findings:  Medication assistance needs identified: Eliquis, Linzess, Restasis  Extra Help:  May be eligible for Full  Extra Help Low Income Subsidy based on reported income and assets  Patient Assistance Programs: Eliquis made by [employer] o Income requirement met: Unknown o Out-of-pocket prescription expenditure met:   Unknown - Reviewed program requirements with patient.  Patient does not know how much she has spent on co-pays or what her household income is.  She will look for documents to confirm income.   Restasis and Linzess made by [employer] Income requirement met: Unknown o Out-of-pocket prescription expenditure met:   Not Applicable - Reviewed program requirements with patient.   - Generally, this patient assistance program does not approve applications if patient has co-pay for the medication through insurance.  Likely that patient is not eligible for this program.    Plan: . Will f/u with patient later this week to confirm income and whether she may qualify for Extra Help or PAP  . Will f/u with patient regarding aspirin dose recommendations from neurologist  CRalene Bathe PharmD, [phone]

## 2018-08-21 ENCOUNTER — Telehealth: Payer: Self-pay

## 2018-08-21 ENCOUNTER — Telehealth: Payer: Self-pay | Admitting: *Deleted

## 2018-08-21 DIAGNOSIS — G890 Central pain syndrome: Secondary | ICD-10-CM | POA: Diagnosis not present

## 2018-08-21 DIAGNOSIS — G4701 Insomnia due to medical condition: Secondary | ICD-10-CM | POA: Diagnosis not present

## 2018-08-21 DIAGNOSIS — R5383 Other fatigue: Secondary | ICD-10-CM | POA: Diagnosis not present

## 2018-08-21 DIAGNOSIS — M542 Cervicalgia: Secondary | ICD-10-CM | POA: Diagnosis not present

## 2018-08-21 MED ORDER — METOPROLOL TARTRATE 25 MG PO TABS: ORAL_TABLET | ORAL | 3 refills | Status: DC

## 2018-08-21 NOTE — Telephone Encounter (Signed)
Received request from pharmacy for PA on Atovastatin 40mg  (2 tabs). Patient is unable to swallow Atorvastatin 80mg .   PA submitted.   Dx:E78.5- HLD.

## 2018-08-21 NOTE — Telephone Encounter (Signed)
[employer] is reviewing your PA request. Typically an electronic response will be received within 72 hours. To check for an update later, open this request from your dashboard. 

## 2018-08-21 NOTE — ED Notes (Signed)
Patient returned for Carotid US bilateral, results were reviewed by Dr Thurnell Garbe, no new findings. RN discussed results with patient and daughter, instructed to follow up with her primary MD

## 2018-08-22 MED ORDER — ATORVASTATIN CALCIUM 40 MG PO TABS: 80.0000 mg | ORAL_TABLET | Freq: Every day | ORAL | 2 refills | Status: DC

## 2018-08-22 NOTE — Telephone Encounter (Signed)
Refill sent.

## 2018-08-22 NOTE — Telephone Encounter (Signed)
Received PA determination.   PA- [id number] approved through 03/14/2019.  Pharmacy made aware.

## 2018-08-23 ENCOUNTER — Other Ambulatory Visit: Payer: Self-pay | Admitting: Pharmacist

## 2018-08-23 ENCOUNTER — Ambulatory Visit: Payer: Self-pay | Admitting: Pharmacist

## 2018-08-23 DIAGNOSIS — I10 Essential (primary) hypertension: Secondary | ICD-10-CM | POA: Diagnosis not present

## 2018-08-23 DIAGNOSIS — M5412 Radiculopathy, cervical region: Secondary | ICD-10-CM | POA: Diagnosis not present

## 2018-08-23 DIAGNOSIS — M542 Cervicalgia: Secondary | ICD-10-CM | POA: Diagnosis not present

## 2018-08-23 NOTE — Patient Outreach (Signed)
[place] [facility]) Care Management  [place] 08/23/2018  [name] [birth year] [id number]  Reason for call: f/u on eligibility for patient assistance  / aspirin + apixiaban  Successful call with [name].   -Patient reports she spoke with neurologist yesterday and aspirin has been stopped due to patient now being on apixaban.  -Patient reports annual income ~$15000, therefore eligible to apply for Extra Help Low Income Subsidy.   -Assisted patient with applying for Extra Help LIS on-line.  Reviewed program in detail.    Plan: Will f/u with patient in 2-4 weeks to find out if she has been approved for Extra Help LIS  Ralene Bathe, PharmD, [place] [phone]

## 2018-08-27 ENCOUNTER — Ambulatory Visit (INDEPENDENT_AMBULATORY_CARE_PROVIDER_SITE_OTHER): Payer: Medicare Other | Admitting: Otolaryngology

## 2018-08-27 DIAGNOSIS — H903 Sensorineural hearing loss, bilateral: Secondary | ICD-10-CM

## 2018-08-27 DIAGNOSIS — H9312 Tinnitus, left ear: Secondary | ICD-10-CM

## 2018-08-27 DIAGNOSIS — H6123 Impacted cerumen, bilateral: Secondary | ICD-10-CM

## 2018-08-31 ENCOUNTER — Ambulatory Visit: Payer: Medicare Other | Admitting: Family Medicine

## 2018-09-03 DIAGNOSIS — M6281 Muscle weakness (generalized): Secondary | ICD-10-CM | POA: Diagnosis not present

## 2018-09-03 DIAGNOSIS — M5030 Other cervical disc degeneration, unspecified cervical region: Secondary | ICD-10-CM | POA: Diagnosis not present

## 2018-09-03 DIAGNOSIS — R269 Unspecified abnormalities of gait and mobility: Secondary | ICD-10-CM | POA: Diagnosis not present

## 2018-09-03 DIAGNOSIS — I10 Essential (primary) hypertension: Secondary | ICD-10-CM | POA: Diagnosis not present

## 2018-09-03 DIAGNOSIS — M170 Bilateral primary osteoarthritis of knee: Secondary | ICD-10-CM | POA: Diagnosis not present

## 2018-09-06 ENCOUNTER — Telehealth: Payer: Self-pay

## 2018-09-06 DIAGNOSIS — R269 Unspecified abnormalities of gait and mobility: Secondary | ICD-10-CM | POA: Diagnosis not present

## 2018-09-06 DIAGNOSIS — I10 Essential (primary) hypertension: Secondary | ICD-10-CM | POA: Diagnosis not present

## 2018-09-06 DIAGNOSIS — M5030 Other cervical disc degeneration, unspecified cervical region: Secondary | ICD-10-CM | POA: Diagnosis not present

## 2018-09-06 DIAGNOSIS — M6281 Muscle weakness (generalized): Secondary | ICD-10-CM | POA: Diagnosis not present

## 2018-09-06 DIAGNOSIS — M170 Bilateral primary osteoarthritis of knee: Secondary | ICD-10-CM | POA: Diagnosis not present

## 2018-09-06 NOTE — Telephone Encounter (Signed)
Please schedule apt for September 2020 (discuss TCS) per pts request. She received the letter from our office and she has several appointments in August.

## 2018-09-10 ENCOUNTER — Encounter: Payer: Self-pay | Admitting: Gastroenterology

## 2018-09-10 DIAGNOSIS — M5030 Other cervical disc degeneration, unspecified cervical region: Secondary | ICD-10-CM | POA: Diagnosis not present

## 2018-09-10 DIAGNOSIS — M6281 Muscle weakness (generalized): Secondary | ICD-10-CM | POA: Diagnosis not present

## 2018-09-10 DIAGNOSIS — M170 Bilateral primary osteoarthritis of knee: Secondary | ICD-10-CM | POA: Diagnosis not present

## 2018-09-10 DIAGNOSIS — I10 Essential (primary) hypertension: Secondary | ICD-10-CM | POA: Diagnosis not present

## 2018-09-10 DIAGNOSIS — R269 Unspecified abnormalities of gait and mobility: Secondary | ICD-10-CM | POA: Diagnosis not present

## 2018-09-10 NOTE — Telephone Encounter (Signed)
Scheduled patient for appointment in September and sent letter

## 2018-09-14 DIAGNOSIS — M6281 Muscle weakness (generalized): Secondary | ICD-10-CM | POA: Diagnosis not present

## 2018-09-14 DIAGNOSIS — M5030 Other cervical disc degeneration, unspecified cervical region: Secondary | ICD-10-CM | POA: Diagnosis not present

## 2018-09-14 DIAGNOSIS — M170 Bilateral primary osteoarthritis of knee: Secondary | ICD-10-CM | POA: Diagnosis not present

## 2018-09-14 DIAGNOSIS — I10 Essential (primary) hypertension: Secondary | ICD-10-CM | POA: Diagnosis not present

## 2018-09-14 DIAGNOSIS — R269 Unspecified abnormalities of gait and mobility: Secondary | ICD-10-CM | POA: Diagnosis not present

## 2018-09-19 DIAGNOSIS — M5030 Other cervical disc degeneration, unspecified cervical region: Secondary | ICD-10-CM | POA: Diagnosis not present

## 2018-09-19 DIAGNOSIS — M170 Bilateral primary osteoarthritis of knee: Secondary | ICD-10-CM | POA: Diagnosis not present

## 2018-09-19 DIAGNOSIS — I10 Essential (primary) hypertension: Secondary | ICD-10-CM | POA: Diagnosis not present

## 2018-09-19 DIAGNOSIS — M6281 Muscle weakness (generalized): Secondary | ICD-10-CM | POA: Diagnosis not present

## 2018-09-19 DIAGNOSIS — R269 Unspecified abnormalities of gait and mobility: Secondary | ICD-10-CM | POA: Diagnosis not present

## 2018-09-21 DIAGNOSIS — M170 Bilateral primary osteoarthritis of knee: Secondary | ICD-10-CM | POA: Diagnosis not present

## 2018-09-21 DIAGNOSIS — I10 Essential (primary) hypertension: Secondary | ICD-10-CM | POA: Diagnosis not present

## 2018-09-21 DIAGNOSIS — R269 Unspecified abnormalities of gait and mobility: Secondary | ICD-10-CM | POA: Diagnosis not present

## 2018-09-21 DIAGNOSIS — M5030 Other cervical disc degeneration, unspecified cervical region: Secondary | ICD-10-CM | POA: Diagnosis not present

## 2018-09-21 DIAGNOSIS — M6281 Muscle weakness (generalized): Secondary | ICD-10-CM | POA: Diagnosis not present

## 2018-09-24 ENCOUNTER — Other Ambulatory Visit: Payer: Self-pay | Admitting: Pharmacist

## 2018-09-24 ENCOUNTER — Ambulatory Visit: Payer: Self-pay | Admitting: Pharmacist

## 2018-09-24 DIAGNOSIS — M5030 Other cervical disc degeneration, unspecified cervical region: Secondary | ICD-10-CM | POA: Diagnosis not present

## 2018-09-24 DIAGNOSIS — M6281 Muscle weakness (generalized): Secondary | ICD-10-CM | POA: Diagnosis not present

## 2018-09-24 DIAGNOSIS — M170 Bilateral primary osteoarthritis of knee: Secondary | ICD-10-CM | POA: Diagnosis not present

## 2018-09-24 DIAGNOSIS — R269 Unspecified abnormalities of gait and mobility: Secondary | ICD-10-CM | POA: Diagnosis not present

## 2018-09-24 DIAGNOSIS — I10 Essential (primary) hypertension: Secondary | ICD-10-CM | POA: Diagnosis not present

## 2018-09-24 NOTE — Patient Outreach (Signed)
[place] [facility]) Care Management  [place] 09/24/2018  [name] 1934-06-30 [id number]  Reason for call: f/u on Extra Help application  Successful call with [name] today.  Patient reports she has not received letter yet from [facility] re: Extra Help application.  She is aware to watch for it in the mail.   Plan: Will f/u again in 1-2 weeks  Ralene Bathe, PharmD, [place] [phone]

## 2018-09-26 DIAGNOSIS — M5030 Other cervical disc degeneration, unspecified cervical region: Secondary | ICD-10-CM | POA: Diagnosis not present

## 2018-09-26 DIAGNOSIS — I10 Essential (primary) hypertension: Secondary | ICD-10-CM | POA: Diagnosis not present

## 2018-09-26 DIAGNOSIS — M170 Bilateral primary osteoarthritis of knee: Secondary | ICD-10-CM | POA: Diagnosis not present

## 2018-09-26 DIAGNOSIS — R269 Unspecified abnormalities of gait and mobility: Secondary | ICD-10-CM | POA: Diagnosis not present

## 2018-09-26 DIAGNOSIS — M6281 Muscle weakness (generalized): Secondary | ICD-10-CM | POA: Diagnosis not present

## 2018-09-27 ENCOUNTER — Other Ambulatory Visit (HOSPITAL_COMMUNITY): Payer: Self-pay | Admitting: Nurse Practitioner

## 2018-09-27 DIAGNOSIS — I2699 Other pulmonary embolism without acute cor pulmonale: Secondary | ICD-10-CM

## 2018-09-27 IMAGING — US US SOFT TISSUE HEAD/NECK
1 series · 13 of 25 positions shown · non-contrast
Comparison: Cervical spine CT - 11/21/2015

CLINICAL DATA: Incidental on CT. Hypothyroidism. History of partial
thyroidectomy.

EXAM:
THYROID ULTRASOUND
TECHNIQUE: Ultrasound examination of the thyroid gland and adjacent soft
tissues was performed.

[Series 1: us soft tissue head/neck · 0.06mm/px · 13 of 42 slices shown]
[im 1/42]
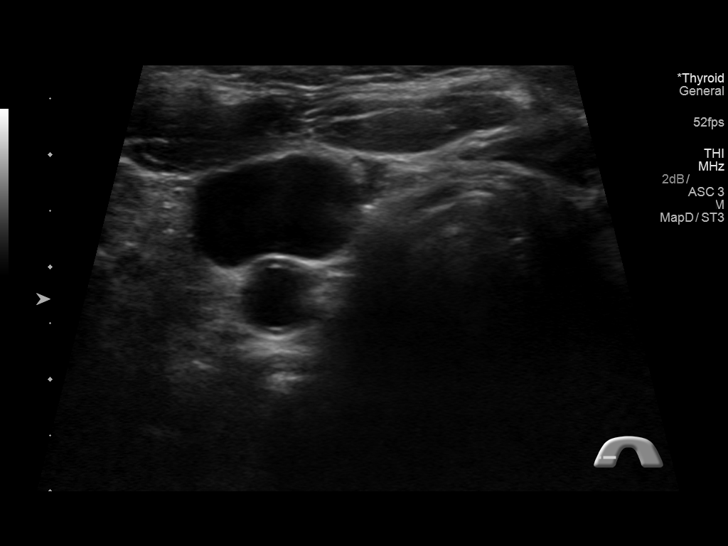
[im 4/42]
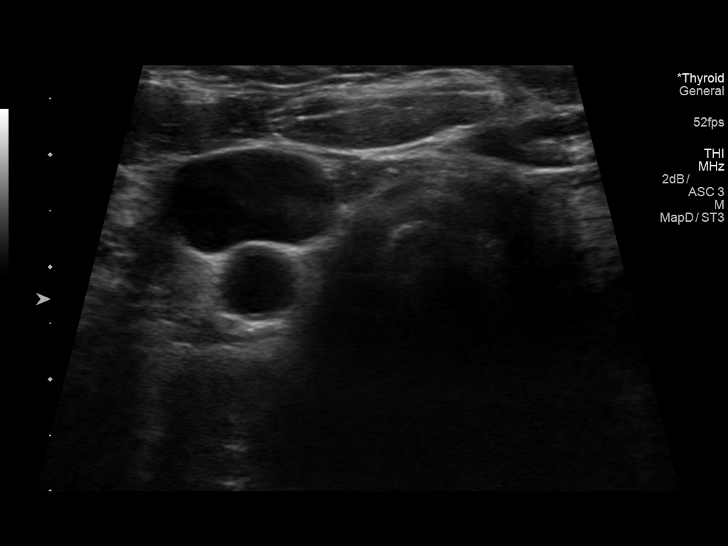
[im 7/42]
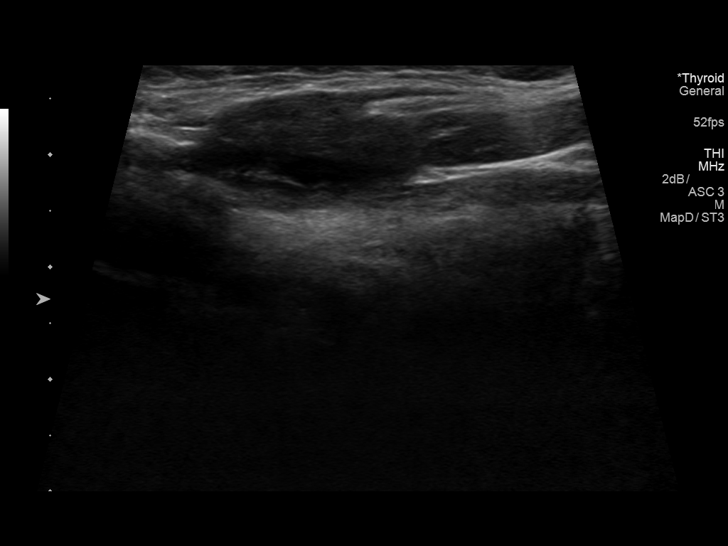
[im 11/42]
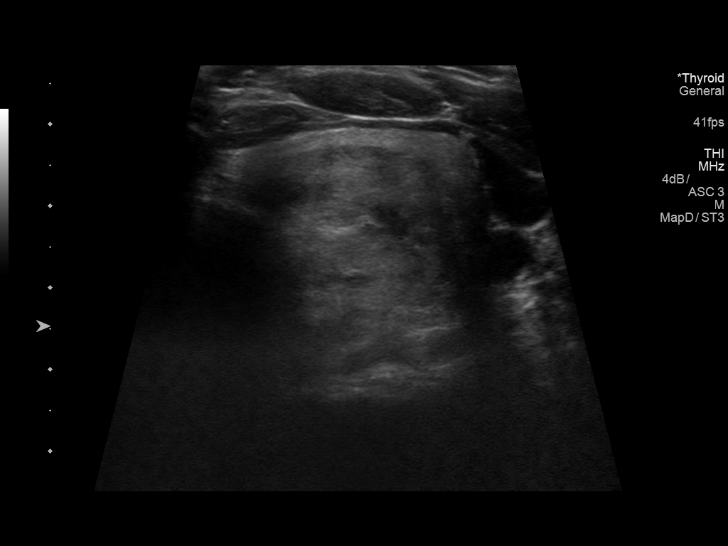
[im 14/42]
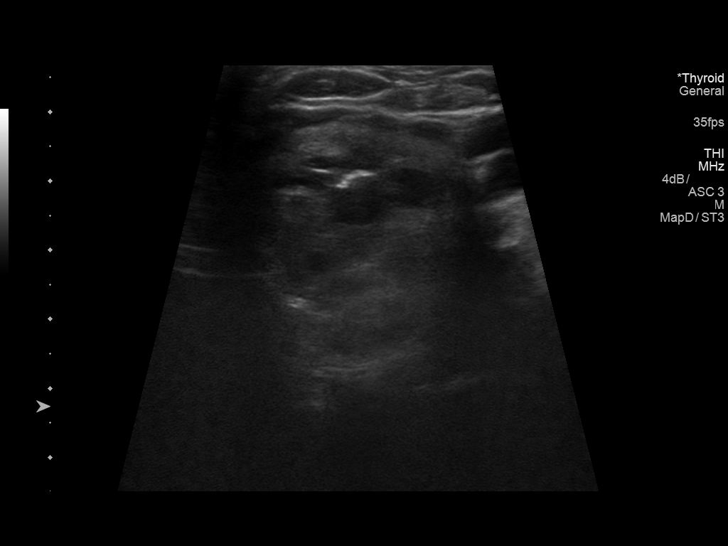
[im 18/42]
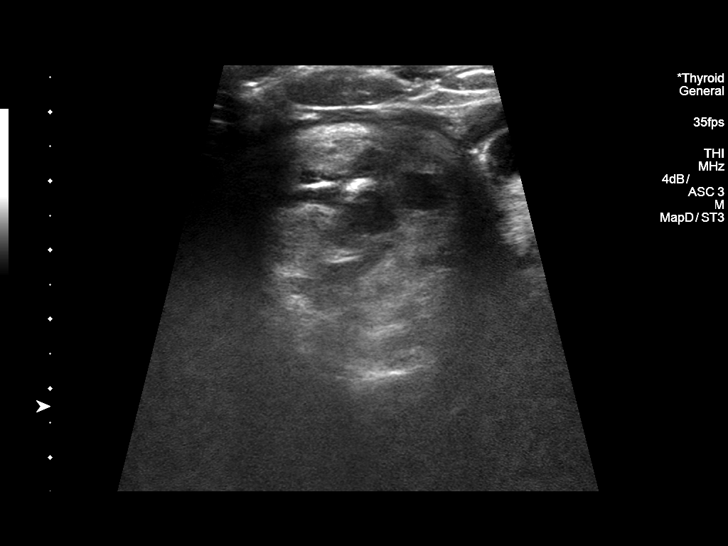
[im 21/42]
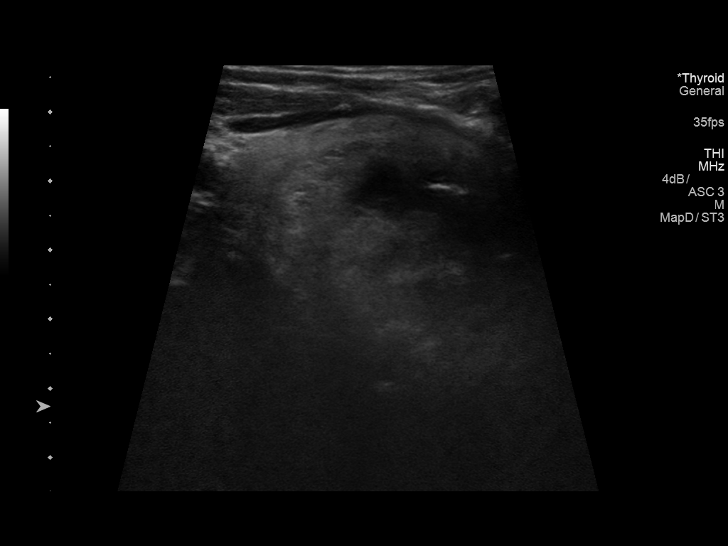
[im 24/42]
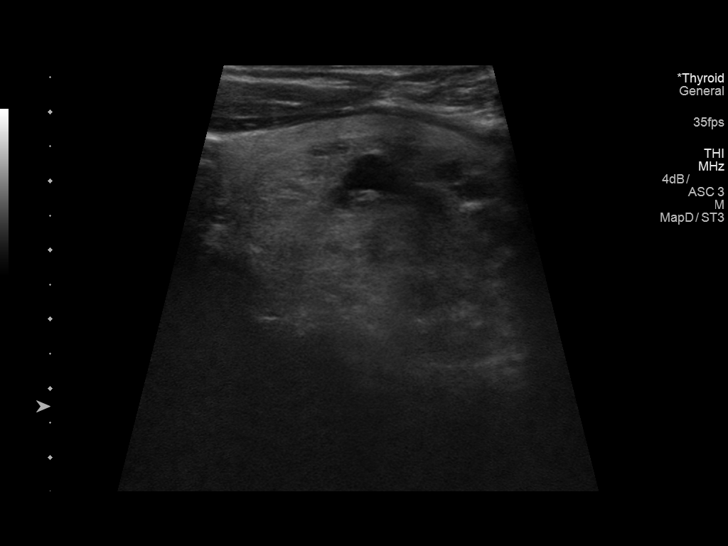
[im 28/42]
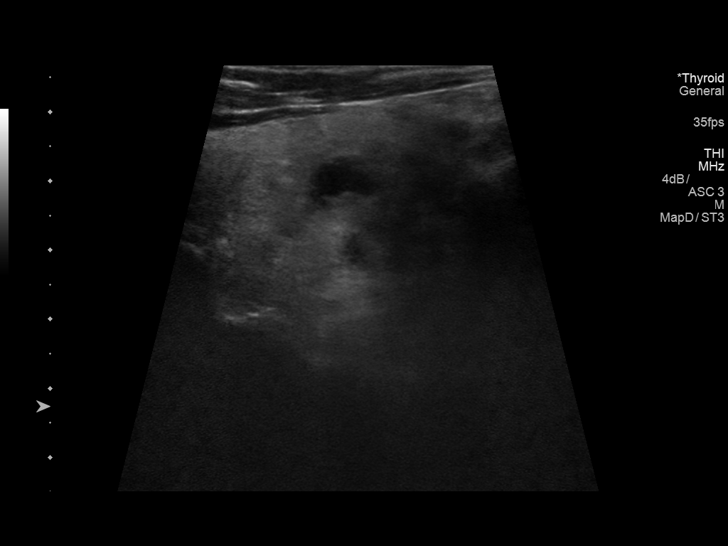
[im 31/42]
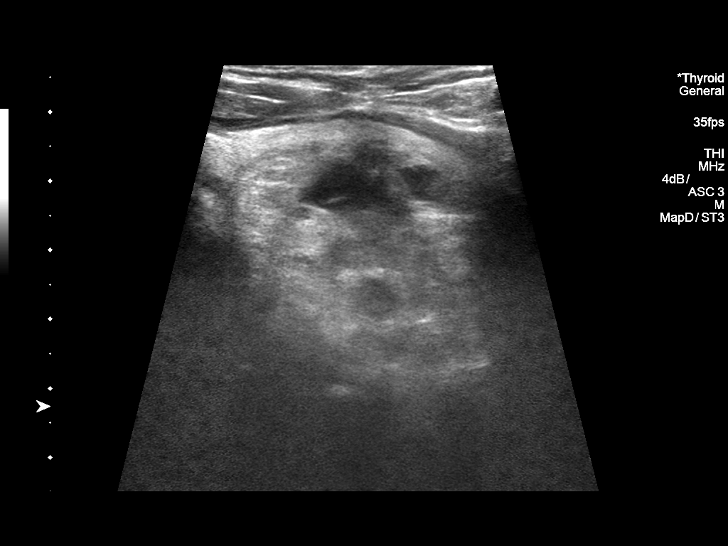
[im 35/42]
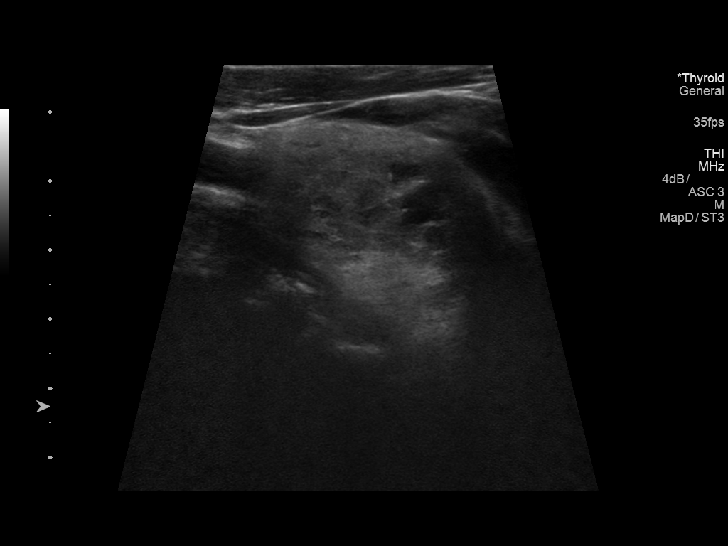
[im 38/42]
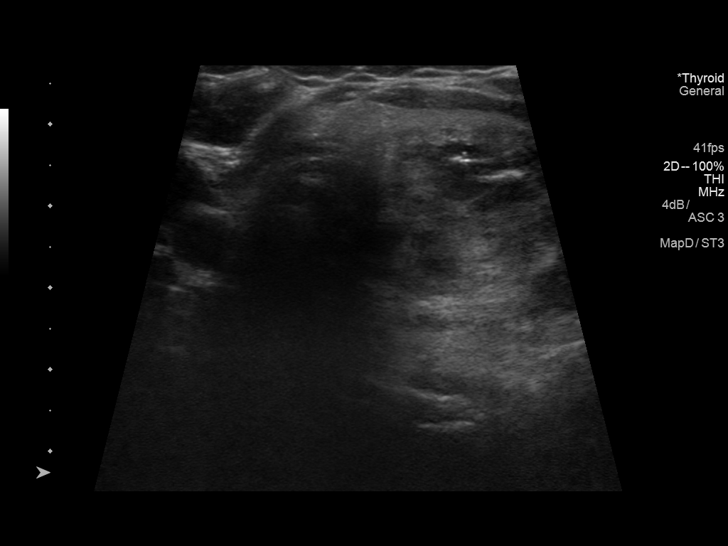
[im 42/42]
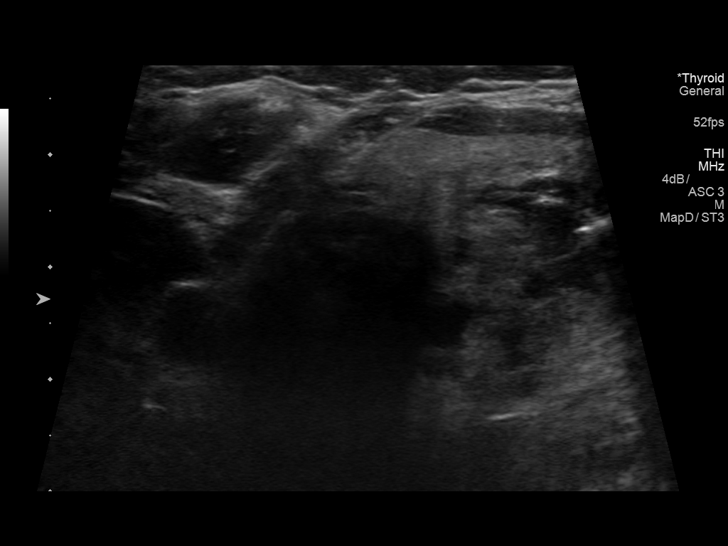

[13 of 25 positions shown; findings below may reference images not displayed]

FINDINGS: Parenchymal Echotexture: Normal

Estimated total number of nodules >/= 1 cm: 1

Number of spongiform nodules >/=  2 cm not described below (TR1): 0

Number of mixed cystic and solid nodules >/= 1.5 cm not described
below (TR2): 0

_________________________________________________________

Isthmus: Surgically absent.

There is no residual nodular soft tissue within the thyroid isthmic
resection bed.

_________________________________________________________

Right lobe: Surgically absent.

There is no residual nodular soft tissue within the right lobectomy
resection bed.

_________________________________________________________

Left lobe: Enlarged measuring 6.0 x 3.5 x 3.3 cm

Nodule # 1:

Location: Left; Mid

Size: 4.5 x 3.2 x 3.9 cm

Composition: solid/almost completely solid (2)

Echogenicity: isoechoic (1)

Shape: not taller-than-wide (0)

Margins: ill-defined (0)

Echogenic foci: macrocalcifications (1)

ACR TI-RADS total points: 4.

ACR TI-RADS risk category: TR4 (4-6 points).

ACR TI-RADS recommendations:

**Given size (>/= 1.5 cm) and appearance, fine needle aspiration of
this moderately suspicious nodule should be considered based on
TI-RADS criteria.
IMPRESSION: 1. Solitary approximately 4.5 cm nodule/mass nearly replacing the
left lobe of the thyroid correlates with the findings seen on
preceding neck CT. This nodule meets imaging criteria to recommend
percutaneous sampling as clinically indicated.
2. Post right thyroid lobectomy without evidence of residual or
locally recurrent disease.

The above is in keeping with the ACR TI-RADS recommendations - [HOSPITAL] 0054;[DATE].

## 2018-09-28 ENCOUNTER — Other Ambulatory Visit: Payer: Self-pay

## 2018-09-28 ENCOUNTER — Inpatient Hospital Stay (HOSPITAL_COMMUNITY): Payer: Medicare Other | Attending: Hematology

## 2018-09-28 DIAGNOSIS — Z85038 Personal history of other malignant neoplasm of large intestine: Secondary | ICD-10-CM | POA: Diagnosis not present

## 2018-09-28 DIAGNOSIS — I2699 Other pulmonary embolism without acute cor pulmonale: Secondary | ICD-10-CM | POA: Diagnosis not present

## 2018-09-28 DIAGNOSIS — Z7901 Long term (current) use of anticoagulants: Secondary | ICD-10-CM | POA: Diagnosis not present

## 2018-09-28 LAB — CBC WITH DIFFERENTIAL/PLATELET
Abs Immature Granulocytes: 0.01 10*3/uL (ref 0.00–0.07)
Basophils Absolute: 0.1 10*3/uL (ref 0.0–0.1)
Basophils Relative: 1 %
Eosinophils Absolute: 0.1 10*3/uL (ref 0.0–0.5)
Eosinophils Relative: 3 %
HCT: 43.8 % (ref 36.0–46.0)
Hemoglobin: 13.8 g/dL (ref 12.0–15.0)
Immature Granulocytes: 0 %
Lymphocytes Relative: 34 %
Lymphs Abs: 1.6 10*3/uL (ref 0.7–4.0)
MCH: 31.2 pg (ref 26.0–34.0)
MCHC: 31.5 g/dL (ref 30.0–36.0)
MCV: 99.1 fL (ref 80.0–100.0)
Monocytes Absolute: 0.5 10*3/uL (ref 0.1–1.0)
Monocytes Relative: 11 %
Neutro Abs: 2.5 10*3/uL (ref 1.7–7.7)
Neutrophils Relative %: 51 %
Platelets: 304 10*3/uL (ref 150–400)
RBC: 4.42 MIL/uL (ref 3.87–5.11)
RDW: 14.3 % (ref 11.5–15.5)
WBC: 4.8 10*3/uL (ref 4.0–10.5)
nRBC: 0 % (ref 0.0–0.2)

## 2018-09-28 LAB — D-DIMER, QUANTITATIVE: D-Dimer, Quant: <0.27 ug/mL-FEU (ref 0.00–0.50)

## 2018-10-01 DIAGNOSIS — M6281 Muscle weakness (generalized): Secondary | ICD-10-CM | POA: Diagnosis not present

## 2018-10-01 DIAGNOSIS — M170 Bilateral primary osteoarthritis of knee: Secondary | ICD-10-CM | POA: Diagnosis not present

## 2018-10-01 DIAGNOSIS — I10 Essential (primary) hypertension: Secondary | ICD-10-CM | POA: Diagnosis not present

## 2018-10-01 DIAGNOSIS — R269 Unspecified abnormalities of gait and mobility: Secondary | ICD-10-CM | POA: Diagnosis not present

## 2018-10-01 DIAGNOSIS — M5030 Other cervical disc degeneration, unspecified cervical region: Secondary | ICD-10-CM | POA: Diagnosis not present

## 2018-10-03 ENCOUNTER — Ambulatory Visit: Payer: Medicare Other | Admitting: Gastroenterology

## 2018-10-03 ENCOUNTER — Other Ambulatory Visit: Payer: Self-pay | Admitting: Pharmacist

## 2018-10-03 DIAGNOSIS — M5030 Other cervical disc degeneration, unspecified cervical region: Secondary | ICD-10-CM | POA: Diagnosis not present

## 2018-10-03 DIAGNOSIS — I10 Essential (primary) hypertension: Secondary | ICD-10-CM | POA: Diagnosis not present

## 2018-10-03 DIAGNOSIS — M170 Bilateral primary osteoarthritis of knee: Secondary | ICD-10-CM | POA: Diagnosis not present

## 2018-10-03 DIAGNOSIS — M6281 Muscle weakness (generalized): Secondary | ICD-10-CM | POA: Diagnosis not present

## 2018-10-03 DIAGNOSIS — R269 Unspecified abnormalities of gait and mobility: Secondary | ICD-10-CM | POA: Diagnosis not present

## 2018-10-03 NOTE — Patient Outreach (Signed)
[place] [facility]) Care Management  [place] 10/03/2018  DARCIE MELLONE 1934-09-20 [id number]  Reason for call: f/u on Extra Help application  Outreach:  Unsuccessful telephone call attempt #1 to patient. Message left with another household member  requesting a return call from patient   Call placed to patient's pharmacy, [facility], to [facility].  Eliquis, Restasis, Linzess are same co-pay $47 / month but patient will go into the coverage gap next month.  Extra Help therefore has not been approved yet. TROOP since Jan 1 = $408.43.   She may be close to qualify for Eliquis patient assistance program depending on income.  She previously had stated $15000 / year but if denied for Extra Help income is likely higher.    Plan:  -I will make another outreach attempt to patient within 3-4 business days.    Ralene Bathe, PharmD, [place] [phone]

## 2018-10-04 ENCOUNTER — Ambulatory Visit: Payer: Medicare Other | Admitting: Pharmacist

## 2018-10-05 ENCOUNTER — Inpatient Hospital Stay (HOSPITAL_BASED_OUTPATIENT_CLINIC_OR_DEPARTMENT_OTHER): Payer: Medicare Other | Admitting: Nurse Practitioner

## 2018-10-05 ENCOUNTER — Other Ambulatory Visit: Payer: Self-pay

## 2018-10-05 DIAGNOSIS — Z7901 Long term (current) use of anticoagulants: Secondary | ICD-10-CM

## 2018-10-05 DIAGNOSIS — I2699 Other pulmonary embolism without acute cor pulmonale: Secondary | ICD-10-CM

## 2018-10-05 DIAGNOSIS — Z85038 Personal history of other malignant neoplasm of large intestine: Secondary | ICD-10-CM

## 2018-10-05 MED ORDER — APIXABAN 2.5 MG PO TABS: 2.5000 mg | ORAL_TABLET | Freq: Two times a day (BID) | ORAL | 4 refills | Status: DC

## 2018-10-05 NOTE — Assessment & Plan Note (Addendum)
1.  Unprovoked pulmonary embolism: - CT PE protocol on 04/26/2018 showed bilateral pulmonary embolisms with small right pleural effusions. -Patient was started on Eliquis and is tolerating well. -Hypercoagulable work-up included factor V Leiden and prothrombin gene mutation were negative.  Lupus anticoagulant was positive while patient was on Eliquis.  Beta-2 glycoprotein 1 antibodies were negative. -No evidence of occult malignancy on CT scan of the abdomen and pelvis. - We will assess the risk-benefit ratio of continuing anticoagulation.  Other alternatives is to change her to low-density anticoagulation with Eliquis 2.5 mg twice daily. - Labs on 09/28/2018 showed her WBC 4.8, hemoglobin 13.8, platelets 304, d-dimer less than 0.27. - We will switch her to Eliquis 2.5 mg twice daily which she will remain on. -We will see her back in 4 months with repeat labs.  2.  Colon cancer: -Cecal polypectomy on 06/29/2009 with adenocarcinoma arising in a tubular adenoma. -Right colon segmental resection with 0 out of 25 lymph nodes positive and no residual cancer. -Last colonoscopy was on 08/02/2013 showed hyperplastic polyp in the rectum and sigmoid colon. -CT of abdomen pelvis on 05/28/2018 did not show any evidence of malignancy.  She did not have any significant weight loss.

## 2018-10-05 NOTE — Progress Notes (Signed)
[place], [place] [address]   CLINIC:  Medical Oncology/Hematology  PCP:  Alycia Rossetti, MD [address] [place] [address] [phone]   REASON FOR VISIT: Follow-up for unprovoked pulmonary embolism  CURRENT THERAPY: Eliquis   CANCER STAGING: Cancer Staging Colon cancer [facility]) Staging form: Colon and Rectum, AJCC 7th Edition - Clinical: Stage I (T1, N0, M0) - Unsigned    INTERVAL HISTORY:  Ms. Goatley 84 y.o. female returns for routine follow-up for unprovoked pulmonary embolism.  She reports she has been doing well since her last visit she has no complaints at this time.  She denies any bright red bleeding per rectum or melena.  She denies any easy bruising. Denies any nausea, vomiting, or diarrhea. Denies any new pains. Had not noticed any recent bleeding such as epistaxis, hematuria or hematochezia. Denies recent chest pain on exertion, shortness of breath on minimal exertion, pre-syncopal episodes, or palpitations. Denies any numbness or tingling in hands or feet. Denies any recent fevers, infections, or recent hospitalizations. Patient reports appetite at 75 % and energy level at 50 %. She is eating well and maintaining her weight at this time.     REVIEW OF SYSTEMS:  Review of Systems  Psychiatric/Behavioral: Positive for sleep disturbance.  All other systems reviewed and are negative.    PAST MEDICAL/SURGICAL HISTORY:  Past Medical History:  Diagnosis Date  . Allergy   . Arthritis   . Colon cancer ([place])    colon ca dx 07/30/09  . History of cardiac monitoring 07/2017   "Event monitor demonstrated sinus rhythm with isolated PACs and no arrhythmias"  . History of colon cancer 06/2009   found at time of TCS 06/29/09, 1.2cm sessile cecal polyp, no adjuvent therapy needed  . HTN (hypertension)   . Hx of cardiovascular stress test 07/2017   "No diagnostic ST segment changes to indicate ischemia. Small,  moderate intensity, reversible apical to basal inferolateral defect consistent with ischemia. This is a low risk study. Nuclear stress EF: 84%."  . Hyperlipidemia   . Hypothyroidism   . PE (pulmonary thromboembolism) ([place])   . Renal disorder    cyst on kidney   . Stroke ([place])   . Vertigo    Past Surgical History:  Procedure Laterality Date  . ABDOMINAL HYSTERECTOMY    . COLON SURGERY  07/2009   right hemicolectomy, no residual colon cancer on path  . COLONOSCOPY  07/16/2010   HYI:FOYDXAJOINOM POLYP-TCS 3 YEARS  . COLONOSCOPY N/A 08/02/2013   Procedure: COLONOSCOPY;  Surgeon: Danie Binder, MD;  Location: [facility];  Service: Endoscopy;  Laterality: N/A;  9:30  . PARTIAL HYSTERECTOMY    . PARTIAL THYMECTOMY    . partial thyroidectomy     benign tumors     SOCIAL HISTORY:  Social History   Socioeconomic History  . Marital status: Widowed    Spouse name: Not on file  . Number of children: 2  . Years of education: Not on file  . Highest education level: Not on file  Occupational History  . Occupation: Psychologist, occupational at [employer]  . 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 Financial resource strain: Not on file  . Food insecurity    Worry: Not on file    Inability: Not on file  . Transportation needs    Medical: Not on file    Non-medical: Not on file  Tobacco Use  .  Smoking status: Never Smoker  . Smokeless tobacco: Never Used  Substance and Sexual Activity  . Alcohol use: No    Alcohol/week: 0.0 standard drinks  . Drug use: No  . Sexual activity: Not Currently  Lifestyle  . Physical activity    Days per week: Not on file    Minutes per session: Not on file  . Stress: Not on file  Relationships  . Social Herbalist on phone: Not on file    Gets together: Not on file    Attends religious service: Not on file    Active member of club or organization: Not on file    Attends meetings of clubs or organizations: Not on file     Relationship status: Not on file  . Intimate partner violence    Fear of current or ex partner: Not on file    Emotionally abused: Not on file    Physically abused: Not on file    Forced sexual activity: Not on file  Other Topics Concern  . Not on file  Social History Narrative  . Not on file    FAMILY HISTORY:  Family History  Problem Relation Age of Onset  . Colon cancer Mother        >age60  . Arthritis Mother   . Cancer Mother   . Heart disease Mother   . Hyperlipidemia Mother   . Hypertension Mother   . Heart attack Father   . Heart disease Father   . Diabetes Maternal Aunt   . Hyperlipidemia Daughter   . Hypertension Daughter   . Liver disease Neg Hx     CURRENT MEDICATIONS:  Outpatient Encounter Medications as of 10/05/2018  Medication Sig  . acetaminophen (TYLENOL) 500 MG tablet Take 1,000 mg by mouth every 8 (eight) hours as needed (cold).  Marland Kitchen apixaban (ELIQUIS) 5 MG TABS tablet Take 1 tablet (5 mg total) by mouth 2 (two) times daily.  . Artificial Tear Ointment (DRY EYES OP) Apply 1 drop to eye 2 (two) times daily as needed (dry eyes).  Marland Kitchen atorvastatin (LIPITOR) 40 MG tablet Take 2 tablets (80 mg total) by mouth daily.  . cholecalciferol (VITAMIN D3) 25 MCG (1000 UT) tablet Take 1,000 Units by mouth daily.   Marland Kitchen gabapentin (NEURONTIN) 100 MG capsule Take 1 in the morning and 2 in the evening  . linaclotide (LINZESS) 72 MCG capsule TAKE 1 CAPSULE BY MOUTH DAILY BEFORE BREAKFAST. (Patient taking differently: Take 72 mcg by mouth as needed. TAKE 1 CAPSULE BY MOUTH DAILY BEFORE BREAKFAST.)  . metoprolol tartrate (LOPRESSOR) 25 MG tablet Take 25 mg two times daily, may take an extra as needed for palpitations.  . RESTASIS 0.05 % ophthalmic emulsion Place 1 drop into both eyes 2 (two) times daily.   Marland Kitchen apixaban (ELIQUIS) 2.5 MG TABS tablet Take 1 tablet (2.5 mg total) by mouth 2 (two) times daily.  . [DISCONTINUED] aspirin EC 81 MG tablet Take 2 tablets (162 mg total) by  mouth daily. (Patient not taking: Reported on 10/05/2018)   No facility-administered encounter medications on file as of 10/05/2018.     ALLERGIES:  No Known Allergies   PHYSICAL EXAM:  ECOG Performance status: 1  Vitals:   10/05/18 1104  BP: (!) 176/73  Pulse: 70  Resp: 18  Temp: (!) 97.3 F (36.3 C)  SpO2: 100%   Filed Weights   10/05/18 1104  Weight: 157 lb 4.8 oz (71.4 kg)    Physical Exam Constitutional:  Appearance: Normal appearance. She is normal weight.  Cardiovascular:     Rate and Rhythm: Normal rate and regular rhythm.     Heart sounds: Normal heart sounds.  Pulmonary:     Effort: Pulmonary effort is normal.     Breath sounds: Normal breath sounds.  Abdominal:     General: Bowel sounds are normal.     Palpations: Abdomen is soft.  Musculoskeletal: Normal range of motion.  Skin:    General: Skin is warm and dry.  Neurological:     Mental Status: She is alert and oriented to person, place, and time. Mental status is at baseline.  Psychiatric:        Mood and Affect: Mood normal.        Behavior: Behavior normal.        Thought Content: Thought content normal.        Judgment: Judgment normal.      LABORATORY DATA:  I have reviewed the labs as listed.  CBC    Component Value Date/Time   WBC 4.8 09/28/2018 1037   RBC 4.42 09/28/2018 1037   HGB 13.8 09/28/2018 1037   HGB 14.7 04/16/2015 0812   HCT 43.8 09/28/2018 1037   HCT 44.2 04/16/2015 0812   PLT 304 09/28/2018 1037   PLT 332 04/16/2015 0812   MCV 99.1 09/28/2018 1037   MCV 93.8 04/16/2015 0812   MCH 31.2 09/28/2018 1037   MCHC 31.5 09/28/2018 1037   RDW 14.3 09/28/2018 1037   RDW 15.0 (H) 04/16/2015 0812   LYMPHSABS 1.6 09/28/2018 1037   LYMPHSABS 1.9 04/16/2015 0812   MONOABS 0.5 09/28/2018 1037   MONOABS 0.4 04/16/2015 0812   EOSABS 0.1 09/28/2018 1037   EOSABS 0.2 04/16/2015 0812   BASOSABS 0.1 09/28/2018 1037   BASOSABS 0.1 04/16/2015 0812   CMP Latest Ref Rng & Units  08/08/2018 05/15/2018 05/05/2018  Glucose 70 - 99 mg/dL 112(H) 106(H) 87  BUN 8 - 23 mg/dL 18 27(H) 15  Creatinine 0.44 - 1.00 mg/dL 1.30(H) 1.53(H) 1.12(H)  Sodium 135 - 145 mmol/L 142 141 141  Potassium 3.5 - 5.1 mmol/L 3.8 3.7 4.0  Chloride 98 - 111 mmol/L 106 107 107  CO2 22 - 32 mmol/L 24 26 26   Calcium 8.9 - 10.3 mg/dL 9.4 9.4 9.2  Total Protein 6.5 - 8.1 g/dL 7.6 7.7 -  Total Bilirubin 0.3 - 1.2 mg/dL 0.8 0.6 -  Alkaline Phos 38 - 126 U/L 95 86 -  AST 15 - 41 U/L 20 16 -  ALT 0 - 44 U/L 20 18 -    Patient was seen face to face. All questions were answered to patient's stated satisfaction. Encouraged patient to call with any new concerns or questions before his next visit to the cancer center and we can certain see him sooner, if needed.      ASSESSMENT & PLAN:   Acute pulmonary embolism ([place]) 1.  Unprovoked pulmonary embolism: - CT PE protocol on 04/26/2018 showed bilateral pulmonary embolisms with small right pleural effusions. -Patient was started on Eliquis and is tolerating well. -Hypercoagulable work-up included factor V Leiden and prothrombin gene mutation were negative.  Lupus anticoagulant was positive while patient was on Eliquis.  Beta-2 glycoprotein 1 antibodies were negative. -No evidence of occult malignancy on CT scan of the abdomen and pelvis. - We will assess the risk-benefit ratio of continuing anticoagulation.  Other alternatives is to change her to low-density anticoagulation with Eliquis 2.5 mg twice daily. - Labs  on 09/28/2018 showed her WBC 4.8, hemoglobin 13.8, platelets 304, d-dimer less than 0.27. - We will switch her to Eliquis 2.5 mg twice daily which she will remain on. -We will see her back in 4 months with repeat labs.  2.  Colon cancer: -Cecal polypectomy on 06/29/2009 with adenocarcinoma arising in a tubular adenoma. -Right colon segmental resection with 0 out of 25 lymph nodes positive and no residual cancer. -Last colonoscopy was on 08/02/2013  showed hyperplastic polyp in the rectum and sigmoid colon. -CT of abdomen pelvis on 05/28/2018 did not show any evidence of malignancy.  She did not have any significant weight loss.      Orders placed this encounter:  Orders Placed This Encounter  Procedures  . CBC with Differential/Platelet  . Comprehensive metabolic panel  . D-dimer, quantitative      Francene Finders, FNP-C [place] [phone]

## 2018-10-05 NOTE — Patient Instructions (Signed)
[facility] at [facility] Discharge Instructions Follow up in 4 months with labs    Thank you for choosing [facility] at [facility] to provide your oncology and hematology care.  To afford each patient quality time with our provider, please arrive at least 15 minutes before your scheduled appointment time.   If you have a lab appointment with the Cancer Center please come in thru the  Main Entrance and check in at the main information desk  You need to re-schedule your appointment should you arrive 10 or more minutes late.  We strive to give you quality time with our providers, and arriving late affects you and other patients whose appointments are after yours.  Also, if you no show three or more times for appointments you may be dismissed from the clinic at the providers discretion.     Again, thank you for choosing [facility].  Our hope is that these requests will decrease the amount of time that you wait before being seen by our physicians.       _____________________________________________________________  Should you have questions after your visit to [facility], please contact our office at [phone] between the hours of 8:00 a.m. and 4:30 p.m.  Voicemails left after 4:00 p.m. will not be returned until the following business day.  For prescription refill requests, have your pharmacy contact our office and allow 72 hours.    Cancer Center Support Programs:   > Cancer Support Group  2nd Tuesday of the month 1pm-2pm, Journey Room    

## 2018-10-08 DIAGNOSIS — M6281 Muscle weakness (generalized): Secondary | ICD-10-CM | POA: Diagnosis not present

## 2018-10-08 DIAGNOSIS — M170 Bilateral primary osteoarthritis of knee: Secondary | ICD-10-CM | POA: Diagnosis not present

## 2018-10-08 DIAGNOSIS — M5030 Other cervical disc degeneration, unspecified cervical region: Secondary | ICD-10-CM | POA: Diagnosis not present

## 2018-10-08 DIAGNOSIS — R269 Unspecified abnormalities of gait and mobility: Secondary | ICD-10-CM | POA: Diagnosis not present

## 2018-10-08 DIAGNOSIS — I10 Essential (primary) hypertension: Secondary | ICD-10-CM | POA: Diagnosis not present

## 2018-10-09 ENCOUNTER — Ambulatory Visit: Payer: Self-pay | Admitting: Pharmacist

## 2018-10-09 ENCOUNTER — Other Ambulatory Visit: Payer: Self-pay | Admitting: Pharmacist

## 2018-10-09 NOTE — Patient Outreach (Addendum)
[facility]) Care Management  [place] 10/09/2018  [name] [birth year] [id number]  Reason for call: f/u on Extra Help application / patient assistance programs  Outreach:  Unsuccessful telephone call attempt #2 to patient. HIPAA compliant voicemail left requesting a return call  Plan:  -I will make another outreach attempt to patient within 3-4 business days.    Ralene Bathe, PharmD, [place] [phone]    Addendum: Incoming call from patient.  She reports she still has not received any letter from [facility] re: Extra Help but co-pays have not changed yet from pharmacy.  We reviewed that due to having co-pays for Linzess and Restasis, she will not likely be approved for the patient assistance program through [place].  Depending on her income and TROOP, she may qualify for Eliquis.  Patient would like to apply for Eliquis.  We reviewed necessary documents.  Patient voiced understanding.   Also noted dose of Eliquis reduced recently to 2.5mg  BID.   Plan: I will route patient assistance letter to [place] technician who will coordinate patient assistance program application process for medications listed above.  [facility] pharmacy technician will assist with obtaining all required documents from both patient and provider(s) and submit application(s) once completed.    Ralene Bathe, PharmD, [place] [phone]

## 2018-10-10 ENCOUNTER — Other Ambulatory Visit: Payer: Self-pay | Admitting: Pharmacy Technician

## 2018-10-10 DIAGNOSIS — M170 Bilateral primary osteoarthritis of knee: Secondary | ICD-10-CM | POA: Diagnosis not present

## 2018-10-10 DIAGNOSIS — M6281 Muscle weakness (generalized): Secondary | ICD-10-CM | POA: Diagnosis not present

## 2018-10-10 DIAGNOSIS — R269 Unspecified abnormalities of gait and mobility: Secondary | ICD-10-CM | POA: Diagnosis not present

## 2018-10-10 DIAGNOSIS — I10 Essential (primary) hypertension: Secondary | ICD-10-CM | POA: Diagnosis not present

## 2018-10-10 DIAGNOSIS — M5030 Other cervical disc degeneration, unspecified cervical region: Secondary | ICD-10-CM | POA: Diagnosis not present

## 2018-10-10 NOTE — Patient Outreach (Signed)
[name] Care Management  10/10/2018  [name] 1934/09/07 [id number]                          Medication Assistance Referral  Referral From: [place]  Medication/Company: Eliquis / BMS Patient application portion:  Mailed Provider application portion: Faxed  to Dr. Garlan Fillers   Follow up:  Will follow up with patient in 7-10 business days to confirm application(s) have been received.  [name] [name] [place] Certified Pharmacy Technician [place] Management Direct Dial:[phone]

## 2018-10-11 ENCOUNTER — Other Ambulatory Visit (HOSPITAL_COMMUNITY): Payer: Self-pay | Admitting: Nurse Practitioner

## 2018-10-12 ENCOUNTER — Ambulatory Visit: Payer: Self-pay | Admitting: Pharmacist

## 2018-10-17 DIAGNOSIS — R269 Unspecified abnormalities of gait and mobility: Secondary | ICD-10-CM | POA: Diagnosis not present

## 2018-10-17 DIAGNOSIS — M5030 Other cervical disc degeneration, unspecified cervical region: Secondary | ICD-10-CM | POA: Diagnosis not present

## 2018-10-17 DIAGNOSIS — M170 Bilateral primary osteoarthritis of knee: Secondary | ICD-10-CM | POA: Diagnosis not present

## 2018-10-17 DIAGNOSIS — M6281 Muscle weakness (generalized): Secondary | ICD-10-CM | POA: Diagnosis not present

## 2018-10-17 DIAGNOSIS — I10 Essential (primary) hypertension: Secondary | ICD-10-CM | POA: Diagnosis not present

## 2018-10-18 DIAGNOSIS — R5383 Other fatigue: Secondary | ICD-10-CM | POA: Diagnosis not present

## 2018-10-18 DIAGNOSIS — G4701 Insomnia due to medical condition: Secondary | ICD-10-CM | POA: Diagnosis not present

## 2018-10-18 DIAGNOSIS — G890 Central pain syndrome: Secondary | ICD-10-CM | POA: Diagnosis not present

## 2018-10-18 DIAGNOSIS — M542 Cervicalgia: Secondary | ICD-10-CM | POA: Diagnosis not present

## 2018-10-22 DIAGNOSIS — M170 Bilateral primary osteoarthritis of knee: Secondary | ICD-10-CM | POA: Diagnosis not present

## 2018-10-22 DIAGNOSIS — M6281 Muscle weakness (generalized): Secondary | ICD-10-CM | POA: Diagnosis not present

## 2018-10-22 DIAGNOSIS — R269 Unspecified abnormalities of gait and mobility: Secondary | ICD-10-CM | POA: Diagnosis not present

## 2018-10-22 DIAGNOSIS — I10 Essential (primary) hypertension: Secondary | ICD-10-CM | POA: Diagnosis not present

## 2018-10-22 DIAGNOSIS — M5030 Other cervical disc degeneration, unspecified cervical region: Secondary | ICD-10-CM | POA: Diagnosis not present

## 2018-10-24 ENCOUNTER — Telehealth: Payer: Self-pay | Admitting: Cardiovascular Disease

## 2018-10-24 DIAGNOSIS — I10 Essential (primary) hypertension: Secondary | ICD-10-CM | POA: Diagnosis not present

## 2018-10-24 DIAGNOSIS — M170 Bilateral primary osteoarthritis of knee: Secondary | ICD-10-CM | POA: Diagnosis not present

## 2018-10-24 DIAGNOSIS — M5030 Other cervical disc degeneration, unspecified cervical region: Secondary | ICD-10-CM | POA: Diagnosis not present

## 2018-10-24 DIAGNOSIS — R269 Unspecified abnormalities of gait and mobility: Secondary | ICD-10-CM | POA: Diagnosis not present

## 2018-10-24 DIAGNOSIS — M6281 Muscle weakness (generalized): Secondary | ICD-10-CM | POA: Diagnosis not present

## 2018-10-24 NOTE — Telephone Encounter (Signed)
Virtual Visit Pre-Appointment Phone Call  "(Name), I am calling you today to discuss your upcoming appointment. We are currently trying to limit exposure to the virus that causes COVID-19 by seeing patients at home rather than in the office."  1. "What is the BEST phone number to call the day of the visit?" - include this in appointment notes  2. Do you have or have access to (through a family member/friend) a smartphone with video capability that we can use for your visit?" a. If yes - list this number in appt notes as cell (if different from BEST phone #) and list the appointment type as a VIDEO visit in appointment notes b. If no - list the appointment type as a PHONE visit in appointment notes  3. Confirm consent - "In the setting of the current Covid19 crisis, you are scheduled for a (phone or video) visit with your provider on (date) at (time).  Just as we do with many in-office visits, in order for you to participate in this visit, we must obtain consent.  If you'd like, I can send this to your mychart (if signed up) or email for you to review.  Otherwise, I can obtain your verbal consent now.  All virtual visits are billed to your insurance company just like a normal visit would be.  By agreeing to a virtual visit, we'd like you to understand that the technology does not allow for your provider to perform an examination, and thus may limit your provider's ability to fully assess your condition. If your provider identifies any concerns that need to be evaluated in person, we will make arrangements to do so.  Finally, though the technology is pretty good, we cannot assure that it will always work on either your or our end, and in the setting of a video visit, we may have to convert it to a phone-only visit.  In either situation, we cannot ensure that we have a secure connection.  Are you willing to proceed?" STAFF: Did the patient verbally acknowledge consent to telehealth visit? Document  YES/NO here: Yes  4. Advise patient to be prepared - "Two hours prior to your appointment, go ahead and check your blood pressure, pulse, oxygen saturation, and your weight (if you have the equipment to check those) and write them all down. When your visit starts, your provider will ask you for this information. If you have an Apple Watch or Kardia device, please plan to have heart rate information ready on the day of your appointment. Please have a pen and paper handy nearby the day of the visit as well."  5. Give patient instructions for MyChart download to smartphone OR [email or web address] as below if video visit (depending on what platform provider is using)  6. Inform patient they will receive a phone call 15 minutes prior to their appointment time (may be from unknown caller ID) so they should be prepared to answer    TELEPHONE CALL NOTE  [name] has been deemed a candidate for a follow-up tele-health visit to limit community exposure during the Covid-19 pandemic. I spoke with the patient via phone to ensure availability of phone/video source, confirm preferred email & phone number, and discuss instructions and expectations.  I reminded [name] to be prepared with any vital sign and/or heart rhythm information that could potentially be obtained via home monitoring, at the time of her visit. I reminded [name] to expect a phone call prior to  her visit.  Orinda Kenner 10/24/2018 9:48 AM

## 2018-10-25 ENCOUNTER — Other Ambulatory Visit: Payer: Self-pay

## 2018-10-26 ENCOUNTER — Ambulatory Visit (INDEPENDENT_AMBULATORY_CARE_PROVIDER_SITE_OTHER): Payer: Medicare Other | Admitting: Family Medicine

## 2018-10-26 VITALS — BP 165/80 | HR 64 | Temp 98.7°F | Resp 18 | Ht 63.0 in | Wt 157.0 lb

## 2018-10-26 DIAGNOSIS — I493 Ventricular premature depolarization: Secondary | ICD-10-CM | POA: Diagnosis not present

## 2018-10-26 DIAGNOSIS — I10 Essential (primary) hypertension: Secondary | ICD-10-CM | POA: Diagnosis not present

## 2018-10-26 DIAGNOSIS — R002 Palpitations: Secondary | ICD-10-CM | POA: Diagnosis not present

## 2018-10-26 MED ORDER — GABAPENTIN 300 MG PO CAPS: ORAL_CAPSULE | ORAL | Status: DC

## 2018-10-26 NOTE — Patient Instructions (Addendum)
Take 1.5 TABLETS of metoprolol in the morning and and take 1 in the evening Okay to use the gabapentin twice a day  We will call with lab results  F/U as previous

## 2018-10-26 NOTE — Progress Notes (Signed)
   Subjective:    Patient ID: [name], female    DOB: April 04, 1934, 84 y.o.   MRN: [id number]  Patient presents for Hypertension Patient here secondary to elevated blood pressure.  She was here 2 months ago for her hospital follow-up at that time been well controlled.  She does have atrial fibrillation she is on Eliquis.  On occasion she takes an extra metoprolol if she feels her heart rate going too fast.  She has a cardiology appointment in 2 weeks She is currently taking metoprolol 25 mg twice a day.     BP hs been elevated at Neurology office past few times 740-814 systolic, On July 48JE 563/14    She has been having more palpitations as well,   on a few episodes when bb was elevated she has felt a little lightheaded    Took her regular metoprolol 25mg  this AM   Earlier this week did dose of metoprolol    Recently had change in gabapentin to  300mg  BID - started yesterday at increased dose   also given Nurtec for migraines as needed    Has appt Monday with Dr. Benjamine Mola  Review Of Systems:  GEN- denies fatigue, fever, weight loss,weakness, recent illness HEENT- denies eye drainage, change in vision, nasal discharge, CVS- denies chest pain, +palpitations RESP- denies SOB, cough, wheeze ABD- denies N/V, change in stools, abd pain GU- denies dysuria, hematuria, dribbling, incontinence MSK- denies joint pain, muscle aches, injury Neuro- denies headache, +dizziness, syncope, seizure activity      Objective:    BP (!) 165/80 Comment: pts machine  Pulse 64   Temp 98.7 F (37.1 C)   Resp 18   Ht 5\' 3"  (1.6 m)   Wt 157 lb (71.2 kg)   SpO2 97%   BMI 27.81 kg/m  GEN- NAD, alert and oriented x3 HEENT- PERRL, EOMI, non injected sclera, pink conjunctiva, MMM, oropharynx clear Neck- Supple, no thyromegaly, no JVD CVS- RRR, no murmur RESP-CTAB ABD-NABS,soft,NT,ND EXT- No edema Pulses- Radial, DP- 2+   EKG- few PVC noted, compared to previous, no A FIB      Assessment & Plan:      Problem List Items Addressed This Visit      Unprioritized   Essential hypertension - Primary    HTN with increased ectopic ventricular beats Cardiology is following Will increase her metoprolol tp 37.5mg  in AM, keep 25mg  in the evening, see if this helps with BP Her HR I am concerned may not tolerating the extra in the evening Check her labs including TSH Will send over info to cardiology she has appt in 2 weeks      Relevant Orders   Basic metabolic panel (Completed)   CBC with Differential/Platelet (Completed)   TSH (Completed)    Other Visit Diagnoses    Palpitations       Relevant Orders   Basic metabolic panel (Completed)   CBC with Differential/Platelet (Completed)   TSH (Completed)   EKG 12-Lead (Completed)   PVC (premature ventricular contraction)          Note: This dictation was prepared with Dragon dictation along with smaller phrase technology. Any transcriptional errors that result from this process are unintentional.

## 2018-10-27 LAB — CBC WITH DIFFERENTIAL/PLATELET
Absolute Monocytes: 447 cells/uL (ref 200–950)
Basophils Absolute: 39 cells/uL (ref 0–200)
Basophils Relative: 0.9 %
Eosinophils Absolute: 129 cells/uL (ref 15–500)
Eosinophils Relative: 3 %
HCT: 42.5 % (ref 35.0–45.0)
Hemoglobin: 13.9 g/dL (ref 11.7–15.5)
Lymphs Abs: 1505 cells/uL (ref 850–3900)
MCH: 31.2 pg (ref 27.0–33.0)
MCHC: 32.7 g/dL (ref 32.0–36.0)
MCV: 95.5 fL (ref 80.0–100.0)
MPV: 10.2 fL (ref 7.5–12.5)
Monocytes Relative: 10.4 %
Neutro Abs: 2180 cells/uL (ref 1500–7800)
Neutrophils Relative %: 50.7 %
Platelets: 265 10*3/uL (ref 140–400)
RBC: 4.45 10*6/uL (ref 3.80–5.10)
RDW: 13.8 % (ref 11.0–15.0)
Total Lymphocyte: 35 %
WBC: 4.3 10*3/uL (ref 3.8–10.8)

## 2018-10-27 LAB — BASIC METABOLIC PANEL
BUN/Creatinine Ratio: 10 (calc) (ref 6–22)
BUN: 14 mg/dL (ref 7–25)
CO2: 29 mmol/L (ref 20–32)
Calcium: 9.5 mg/dL (ref 8.6–10.4)
Chloride: 105 mmol/L (ref 98–110)
Creat: 1.4 mg/dL — ABNORMAL HIGH (ref 0.60–0.88)
Glucose, Bld: 93 mg/dL (ref 65–99)
Potassium: 4.2 mmol/L (ref 3.5–5.3)
Sodium: 143 mmol/L (ref 135–146)

## 2018-10-27 LAB — TSH: TSH: 0.37 mIU/L — ABNORMAL LOW (ref 0.40–4.50)

## 2018-10-28 ENCOUNTER — Encounter: Payer: Self-pay | Admitting: Family Medicine

## 2018-10-28 NOTE — Assessment & Plan Note (Signed)
HTN with increased ectopic ventricular beats Cardiology is following Will increase her metoprolol tp 37.5mg  in AM, keep 25mg  in the evening, see if this helps with BP Her HR I am concerned may not tolerating the extra in the evening Check her labs including TSH Will send over info to cardiology she has appt in 2 weeks

## 2018-10-29 ENCOUNTER — Ambulatory Visit (INDEPENDENT_AMBULATORY_CARE_PROVIDER_SITE_OTHER): Payer: Medicare Other | Admitting: Otolaryngology

## 2018-10-29 DIAGNOSIS — H9313 Tinnitus, bilateral: Secondary | ICD-10-CM

## 2018-10-29 DIAGNOSIS — H903 Sensorineural hearing loss, bilateral: Secondary | ICD-10-CM | POA: Diagnosis not present

## 2018-10-29 DIAGNOSIS — R269 Unspecified abnormalities of gait and mobility: Secondary | ICD-10-CM | POA: Diagnosis not present

## 2018-10-29 DIAGNOSIS — M5030 Other cervical disc degeneration, unspecified cervical region: Secondary | ICD-10-CM | POA: Diagnosis not present

## 2018-10-29 DIAGNOSIS — I10 Essential (primary) hypertension: Secondary | ICD-10-CM | POA: Diagnosis not present

## 2018-10-29 DIAGNOSIS — M6281 Muscle weakness (generalized): Secondary | ICD-10-CM | POA: Diagnosis not present

## 2018-10-29 DIAGNOSIS — M170 Bilateral primary osteoarthritis of knee: Secondary | ICD-10-CM | POA: Diagnosis not present

## 2018-10-30 ENCOUNTER — Other Ambulatory Visit: Payer: Self-pay | Admitting: Otolaryngology

## 2018-10-30 DIAGNOSIS — H93A9 Pulsatile tinnitus, unspecified ear: Secondary | ICD-10-CM

## 2018-10-31 ENCOUNTER — Other Ambulatory Visit: Payer: Self-pay | Admitting: Pharmacy Technician

## 2018-10-31 ENCOUNTER — Telehealth: Payer: Medicare Other | Admitting: Cardiovascular Disease

## 2018-10-31 DIAGNOSIS — M5030 Other cervical disc degeneration, unspecified cervical region: Secondary | ICD-10-CM | POA: Diagnosis not present

## 2018-10-31 DIAGNOSIS — I10 Essential (primary) hypertension: Secondary | ICD-10-CM | POA: Diagnosis not present

## 2018-10-31 DIAGNOSIS — M170 Bilateral primary osteoarthritis of knee: Secondary | ICD-10-CM | POA: Diagnosis not present

## 2018-10-31 DIAGNOSIS — R269 Unspecified abnormalities of gait and mobility: Secondary | ICD-10-CM | POA: Diagnosis not present

## 2018-10-31 DIAGNOSIS — M6281 Muscle weakness (generalized): Secondary | ICD-10-CM | POA: Diagnosis not present

## 2018-10-31 NOTE — Patient Outreach (Signed)
[place] [facility]) Care Management  10/31/2018  [name] Nov 17, 1934 [id number]    Successful call placed to patient regarding patient assistance application(s) for Eliquis , HIPAA identifiers verified. [name] confirms that she received patient assistance application. She states that she mailed it back in on 8/18.  Follow up:  Will submit to company once all documents have been received.  [name] [name] [place] Certified Pharmacy Technician [place] Management Direct Dial:[phone]

## 2018-11-06 ENCOUNTER — Ambulatory Visit (HOSPITAL_COMMUNITY)
Admission: RE | Admit: 2018-11-06 | Discharge: 2018-11-06 | Disposition: A | Discharge status: Home / Self Care | Payer: Medicare Other | Source: Ambulatory Visit | Attending: Otolaryngology | Admitting: Otolaryngology

## 2018-11-06 ENCOUNTER — Other Ambulatory Visit: Payer: Self-pay

## 2018-11-06 DIAGNOSIS — R51 Headache: Secondary | ICD-10-CM | POA: Diagnosis not present

## 2018-11-06 DIAGNOSIS — H93A9 Pulsatile tinnitus, unspecified ear: Secondary | ICD-10-CM | POA: Insufficient documentation

## 2018-11-06 MED ORDER — GADOBUTROL 1 MMOL/ML IV SOLN
7.0000 mL | Freq: Once | INTRAVENOUS | Status: AC | PRN
  Administered 2018-11-06: 15:00:00 7 mL via INTRAVENOUS

## 2018-11-13 ENCOUNTER — Ambulatory Visit (INDEPENDENT_AMBULATORY_CARE_PROVIDER_SITE_OTHER): Payer: Medicare Other | Admitting: Student

## 2018-11-13 ENCOUNTER — Encounter: Payer: Self-pay | Admitting: Student

## 2018-11-13 ENCOUNTER — Other Ambulatory Visit: Payer: Self-pay

## 2018-11-13 VITALS — BP 148/78 | HR 74 | Temp 98.7°F | Ht 63.0 in | Wt 158.0 lb

## 2018-11-13 DIAGNOSIS — I10 Essential (primary) hypertension: Secondary | ICD-10-CM

## 2018-11-13 DIAGNOSIS — R002 Palpitations: Secondary | ICD-10-CM

## 2018-11-13 DIAGNOSIS — E785 Hyperlipidemia, unspecified: Secondary | ICD-10-CM

## 2018-11-13 DIAGNOSIS — Z86711 Personal history of pulmonary embolism: Secondary | ICD-10-CM

## 2018-11-13 MED ORDER — METOPROLOL TARTRATE 25 MG PO TABS: 37.5000 mg | ORAL_TABLET | Freq: Two times a day (BID) | ORAL | 3 refills | Status: DC

## 2018-11-13 NOTE — Progress Notes (Signed)
Cardiology Office Note    Date:  11/13/2018   ID:  [name], DOB Jul 01, 1934, MRN [id number]  PCP:  Alycia Rossetti, MD  Cardiologist: Kate Sable, MD    Chief Complaint  Patient presents with  . Follow-up    Overdue appt    History of Present Illness:    [name] is a 84 y.o. female with past medical history of HTN, hypothyroidism, palpitations (PAC's by prior monitor in 07/2017), and prior CVA who presents to the office today for overdue 48-month follow-up.  She was last examined by myself in 02/2018 and reported intermittent palpitations and this had improved with titration of Lopressor to 25 mg twice daily at the her prior visit.  Symptoms were typically only lasting for a few seconds and spontaneously resolving, therefore she was continued on her current dosing with instructions to take an extra tablet if needed for palpitations.  In talking with the patient and her daughter today, she reports that she was diagnosed with a pulmonary embolus back in 04/2018 and was started on Eliquis for anticoagulation at that time. She has followed up with Hematology and dosing has been reduced back and she is now on 2.5 mg twice daily. She denies any recent melena or hematochezia with this. Does experience easy bruising.  Reports that Amlodipine was discontinued during her admission at that time and BP has been variable when checked at home. Was evaluated by her PCP last month and was having more frequent palpitations, therefore Lopressor was titrated to 37.5mg  in AM/25mg  in PM. Reports this helped with her daytime episodes but she typically experiences worse symptoms at night. This can last for several seconds up to several minutes and spontaneously resolves. Describes a "thumping sensation" along her left pectoral region when this occurs. She does not consume alcohol and limits caffeinated beverages.  She does have occasional chocolate but has not noticed this  worsens her symptoms.   Past Medical History:  Diagnosis Date  . Allergy   . Arthritis   . Colon cancer ([place])    colon ca dx 07/30/09  . History of cardiac monitoring 07/2017   "Event monitor demonstrated sinus rhythm with isolated PACs and no arrhythmias"  . History of colon cancer 06/2009   found at time of TCS 06/29/09, 1.2cm sessile cecal polyp, no adjuvent therapy needed  . HTN (hypertension)   . Hx of cardiovascular stress test 07/2017   "No diagnostic ST segment changes to indicate ischemia. Small, moderate intensity, reversible apical to basal inferolateral defect consistent with ischemia. This is a low risk study. Nuclear stress EF: 84%."  . Hyperlipidemia   . Hypothyroidism   . PE (pulmonary thromboembolism) ([place])   . Renal disorder    cyst on kidney   . Stroke ([place])   . Vertigo     Past Surgical History:  Procedure Laterality Date  . ABDOMINAL HYSTERECTOMY    . COLON SURGERY  07/2009   right hemicolectomy, no residual colon cancer on path  . COLONOSCOPY  07/16/2010   [id number] POLYP-TCS 3 YEARS  . COLONOSCOPY N/A 08/02/2013   Procedure: COLONOSCOPY;  Surgeon: Danie Binder, MD;  Location: [facility];  Service: Endoscopy;  Laterality: N/A;  9:30  . PARTIAL HYSTERECTOMY    . PARTIAL THYMECTOMY    . partial thyroidectomy     benign tumors    Current Medications: Outpatient Medications Prior to Visit  Medication Sig Dispense Refill  . acetaminophen (TYLENOL) 500 MG tablet Take  1,000 mg by mouth every 8 (eight) hours as needed (cold).    Marland Kitchen apixaban (ELIQUIS) 2.5 MG TABS tablet Take 1 tablet (2.5 mg total) by mouth 2 (two) times daily. 60 tablet 4  . Artificial Tear Ointment (DRY EYES OP) Apply 1 drop to eye 2 (two) times daily as needed (dry eyes).    Marland Kitchen atorvastatin (LIPITOR) 40 MG tablet Take 2 tablets (80 mg total) by mouth daily. 180 tablet 2  . cholecalciferol (VITAMIN D3) 25 MCG (1000 UT) tablet Take 1,000 Units by mouth daily.     Marland Kitchen gabapentin  (NEURONTIN) 300 MG capsule Take 1 in the morning and 1 in the evening    . linaclotide (LINZESS) 72 MCG capsule TAKE 1 CAPSULE BY MOUTH DAILY BEFORE BREAKFAST. (Patient taking differently: Take 72 mcg by mouth as needed. TAKE 1 CAPSULE BY MOUTH DAILY BEFORE BREAKFAST.) 90 capsule 1  . RESTASIS 0.05 % ophthalmic emulsion Place 1 drop into both eyes 2 (two) times daily.     Marland Kitchen apixaban (ELIQUIS) 5 MG TABS tablet Take 1 tablet (5 mg total) by mouth 2 (two) times daily. 60 tablet 5  . metoprolol tartrate (LOPRESSOR) 25 MG tablet Take 25 mg two times daily, may take an extra as needed for palpitations. (Patient taking differently: Pt taking 37.5 mg in the AM,Take 25 mg two times daily, may take an extra as needed for palpitations.) 225 tablet 3   No facility-administered medications prior to visit.      Allergies:   Patient has no known allergies.   Social History   Socioeconomic History  . Marital status: Widowed    Spouse name: Not on file  . Number of children: 2  . Years of education: Not on file  . Highest education level: Not on file  Occupational History  . Occupation: Psychologist, occupational at [employer]  . 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 Financial resource strain: Not on file  . Food insecurity    Worry: Not on file    Inability: Not on file  . Transportation needs    Medical: Not on file    Non-medical: Not on file  Tobacco Use  . Smoking status: Never Smoker  . Smokeless tobacco: Never Used  Substance and Sexual Activity  . Alcohol use: No    Alcohol/week: 0.0 standard drinks  . Drug use: No  . Sexual activity: Not Currently  Lifestyle  . Physical activity    Days per week: Not on file    Minutes per session: Not on file  . Stress: Not on file  Relationships  . Social Herbalist on phone: Not on file    Gets together: Not on file    Attends religious service: Not on file    Active member of club or organization: Not on file     Attends meetings of clubs or organizations: Not on file    Relationship status: Not on file  Other Topics Concern  . Not on file  Social History Narrative  . Not on file     Family History:  The patient's family history includes Arthritis in her mother; Cancer in her mother; Colon cancer in her mother; Diabetes in her maternal aunt; Heart attack in her father; Heart disease in her father and mother; Hyperlipidemia in her daughter and mother; Hypertension in her daughter and mother.   Review of Systems:   Please see the history of present illness.  General:  No chills, fever, night sweats or weight changes.  Cardiovascular:  No chest pain, dyspnea on exertion, edema, orthopnea, paroxysmal nocturnal dyspnea. Positive for palpitations.  Dermatological: No rash, lesions/masses Respiratory: No cough, dyspnea Urologic: No hematuria, dysuria Abdominal:   No nausea, vomiting, diarrhea, bright red blood per rectum, melena, or hematemesis Neurologic:  No visual changes, wkns, changes in mental status. All other systems reviewed and are otherwise negative except as noted above.   Physical Exam:    VS:  BP (!) 148/78 (BP Location: Left Arm)   Pulse 74   Temp 98.7 F (37.1 C)   Ht 5\' 3"  (1.6 m)   Wt 158 lb (71.7 kg)   SpO2 97%   BMI 27.99 kg/m    General: Well developed, well nourished,female appearing in no acute distress. Head: Normocephalic, atraumatic, sclera non-icteric, no xanthomas, nares are without discharge.  Neck: No carotid bruits. JVD not elevated.  Lungs: Respirations regular and unlabored, without wheezes or rales.  Heart: Regular rate and rhythm. No S3 or S4.  No murmur, no rubs, or gallops appreciated. Abdomen: Soft, non-tender, non-distended with normoactive bowel sounds. No hepatomegaly. No rebound/guarding. No obvious abdominal masses. Msk:  Strength and tone appear normal for age. No joint deformities or effusions. Extremities: No clubbing or cyanosis. Trace ankle  edema bilaterally.  Distal pedal pulses are 2+ bilaterally. Neuro: Alert and oriented X 3. Moves all extremities spontaneously. No focal deficits noted. Psych:  Responds to questions appropriately with a normal affect. Skin: No rashes or lesions noted  Wt Readings from Last 3 Encounters:  11/13/18 158 lb (71.7 kg)  10/26/18 157 lb (71.2 kg)  10/05/18 157 lb 4.8 oz (71.4 kg)     Studies/Labs Reviewed:   EKG:  EKG is ordered today.   Recent Labs: 08/08/2018: ALT 20 10/26/2018: BUN 14; Creat 1.40; Hemoglobin 13.9; Platelets 265; Potassium 4.2; Sodium 143; TSH 0.37   Lipid Panel    Component Value Date/Time   CHOL 131 02/28/2018 0924   TRIG 79 02/28/2018 0924   HDL 48 (L) 02/28/2018 0924   CHOLHDL 2.7 02/28/2018 0924   VLDL 10 11/04/2016 0854   LDLCALC 67 02/28/2018 0924    Additional studies/ records that were reviewed today include:   Echocardiogram: 11/2015 Study Conclusions  - Left ventricle: The cavity size was normal. Wall thickness was   increased in a pattern of mild LVH. Systolic function was   vigorous. The estimated ejection fraction was in the range of 65%   to 70%. Wall motion was normal; there were no regional wall   motion abnormalities. Doppler parameters are consistent with   abnormal left ventricular relaxation (grade 1 diastolic   dysfunction). Doppler parameters are consistent with high   ventricular filling pressure. - Mitral valve: Mildly calcified annulus. Normal thickness leaflets   . - Tricuspid valve: There was moderate regurgitation. - Pulmonic valve: There was mild regurgitation.   Cardiac Event Monitor: 07/2017  Sinus rhythm with isolated PACs. No arrhythmias.  Carotid Dopplers: 08/20/2018 IMPRESSION: 1. Mild bilateral carotid bifurcation plaque resulting in less than 50% diameter stenosis. 2.  Antegrade bilateral vertebral arterial flow.  Assessment:    1. Palpitations   2. Essential hypertension   3. Hyperlipidemia LDL goal <70    4. History of pulmonary embolus (PE)      Plan:   In order of problems listed above:  1. Palpitations - Prior monitoring showed PAC's with no significant arrhythmias. Did have PVC's by recent EKG. Her symptoms  typically last for seconds to minutes then spontaneously resolve. She denies any associated symptoms when this occurs. Will plan to further titrate Lopressor to 37.5 mg twice daily. Pending heart rate and blood pressure response, this could be further titrated to 50 mg twice daily. TSH improved to 0.37 on recent labs and electrolytes within normal limits. If symptoms persist and do not improve with titration of beta-blocker therapy, would consider obtaining a repeat echocardiogram to assess for any structural abnormalities as her last study was in 2017.  2. HTN - BP elevated at 148/78 during today's visit.  She is currently on Lopressor 37.5 mg in AM and 25 mg in PM. Will plan to titrate to 37.5 mg twice daily as outlined above. I have asked them to continue to follow her BP at home.  3. HLD - followed by PCP. FLP in 2019 showed total cholesterol of 131, HDL 48, and LDL 67. Continue Atorvastatin 80mg  daily.   4. History of PE - occurred in 04/2018. Followed by Hematology and Eliquis has been reduced to 2.5mg  BID given her age and variable renal function (creatinine 1.3 - 1.5 within the past few months).   Medication Adjustments/Labs and Tests Ordered: Current medicines are reviewed at length with the patient today.  Concerns regarding medicines are outlined above.  Medication changes, Labs and Tests ordered today are listed in the Patient Instructions below. Patient Instructions  Medication Instructions:  Increase Lopressor to 37.5mg  twice daily.   Labwork: None  Testing/Procedures: None  Follow-Up: In 6 months with Dr. Bronson Ing  Any Other Special Instructions Will Be Listed Below (If Applicable).  Call with heart rate and blood pressure readings in 2 weeks.    If  you need a refill on your cardiac medications before your next appointment, please call your pharmacy.    Signed, Erma Heritage, PA-C  11/13/2018 8:40 PM    [facility] [address] [place], [place] [address] Phone: [phone] Fax: [phone]

## 2018-11-13 NOTE — Patient Instructions (Addendum)
Medication Instructions:  Increase Lopressor to 37.5mg  twice daily.   Labwork: None  Testing/Procedures: None  Follow-Up: In 6 months with Dr. Bronson Ing  Any Other Special Instructions Will Be Listed Below (If Applicable).  Call with heart rate and blood pressure readings in 2 weeks.    If you need a refill on your cardiac medications before your next appointment, please call your pharmacy.

## 2018-11-15 ENCOUNTER — Other Ambulatory Visit: Payer: Self-pay

## 2018-11-16 ENCOUNTER — Ambulatory Visit (INDEPENDENT_AMBULATORY_CARE_PROVIDER_SITE_OTHER): Payer: Medicare Other | Admitting: Family Medicine

## 2018-11-16 VITALS — BP 132/80 | HR 66 | Temp 98.8°F | Resp 18 | Ht 63.0 in | Wt 157.8 lb

## 2018-11-16 DIAGNOSIS — I10 Essential (primary) hypertension: Secondary | ICD-10-CM | POA: Diagnosis not present

## 2018-11-16 DIAGNOSIS — M170 Bilateral primary osteoarthritis of knee: Secondary | ICD-10-CM

## 2018-11-16 DIAGNOSIS — Z23 Encounter for immunization: Secondary | ICD-10-CM

## 2018-11-16 DIAGNOSIS — R2681 Unsteadiness on feet: Secondary | ICD-10-CM

## 2018-11-16 DIAGNOSIS — R296 Repeated falls: Secondary | ICD-10-CM

## 2018-11-16 DIAGNOSIS — I493 Ventricular premature depolarization: Secondary | ICD-10-CM

## 2018-11-16 NOTE — Patient Instructions (Addendum)
F/U end of December for Physical Referral back to physical therapy  Flu shot given  Cancel appt for Oct 5th
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
## 2018-11-19 ENCOUNTER — Encounter: Payer: Self-pay | Admitting: Family Medicine

## 2018-11-19 NOTE — Assessment & Plan Note (Signed)
Much improved, no change to doses  Reviewed cardilogy note

## 2018-11-25 ENCOUNTER — Emergency Department (HOSPITAL_COMMUNITY): Payer: Medicare Other

## 2018-11-25 ENCOUNTER — Observation Stay (HOSPITAL_COMMUNITY)
Admission: EM | Admit: 2018-11-25 | Discharge: 2018-11-27 | Disposition: A | Discharge status: Home / Self Care | Payer: Medicare Other | Attending: Family Medicine | Admitting: Family Medicine

## 2018-11-25 ENCOUNTER — Other Ambulatory Visit: Payer: Self-pay

## 2018-11-25 ENCOUNTER — Encounter (HOSPITAL_COMMUNITY): Payer: Self-pay | Admitting: Emergency Medicine

## 2018-11-25 ENCOUNTER — Observation Stay (HOSPITAL_COMMUNITY): Payer: Medicare Other

## 2018-11-25 DIAGNOSIS — Z86711 Personal history of pulmonary embolism: Secondary | ICD-10-CM | POA: Diagnosis not present

## 2018-11-25 DIAGNOSIS — Z79899 Other long term (current) drug therapy: Secondary | ICD-10-CM | POA: Diagnosis not present

## 2018-11-25 DIAGNOSIS — R2681 Unsteadiness on feet: Secondary | ICD-10-CM | POA: Insufficient documentation

## 2018-11-25 DIAGNOSIS — N183 Chronic kidney disease, stage 3 unspecified: Secondary | ICD-10-CM | POA: Diagnosis present

## 2018-11-25 DIAGNOSIS — I10 Essential (primary) hypertension: Secondary | ICD-10-CM | POA: Diagnosis present

## 2018-11-25 DIAGNOSIS — R0689 Other abnormalities of breathing: Secondary | ICD-10-CM | POA: Diagnosis not present

## 2018-11-25 DIAGNOSIS — E039 Hypothyroidism, unspecified: Secondary | ICD-10-CM | POA: Diagnosis present

## 2018-11-25 DIAGNOSIS — I129 Hypertensive chronic kidney disease with stage 1 through stage 4 chronic kidney disease, or unspecified chronic kidney disease: Secondary | ICD-10-CM | POA: Insufficient documentation

## 2018-11-25 DIAGNOSIS — Z85038 Personal history of other malignant neoplasm of large intestine: Secondary | ICD-10-CM | POA: Diagnosis not present

## 2018-11-25 DIAGNOSIS — I618 Other nontraumatic intracerebral hemorrhage: Secondary | ICD-10-CM

## 2018-11-25 DIAGNOSIS — G459 Transient cerebral ischemic attack, unspecified: Secondary | ICD-10-CM

## 2018-11-25 DIAGNOSIS — Z20828 Contact with and (suspected) exposure to other viral communicable diseases: Secondary | ICD-10-CM | POA: Insufficient documentation

## 2018-11-25 DIAGNOSIS — R531 Weakness: Secondary | ICD-10-CM | POA: Diagnosis not present

## 2018-11-25 DIAGNOSIS — N184 Chronic kidney disease, stage 4 (severe): Secondary | ICD-10-CM | POA: Diagnosis present

## 2018-11-25 DIAGNOSIS — R41841 Cognitive communication deficit: Secondary | ICD-10-CM | POA: Diagnosis not present

## 2018-11-25 DIAGNOSIS — R4181 Age-related cognitive decline: Secondary | ICD-10-CM | POA: Diagnosis not present

## 2018-11-25 DIAGNOSIS — Z7901 Long term (current) use of anticoagulants: Secondary | ICD-10-CM | POA: Insufficient documentation

## 2018-11-25 DIAGNOSIS — R42 Dizziness and giddiness: Secondary | ICD-10-CM | POA: Diagnosis not present

## 2018-11-25 DIAGNOSIS — N1832 Chronic kidney disease, stage 3b: Secondary | ICD-10-CM | POA: Diagnosis present

## 2018-11-25 HISTORY — DX: Other nontraumatic intracerebral hemorrhage: I61.8

## 2018-11-25 HISTORY — DX: Transient cerebral ischemic attack, unspecified: G45.9

## 2018-11-25 LAB — COMPREHENSIVE METABOLIC PANEL
ALT: 19 U/L (ref 0–44)
AST: 21 U/L (ref 15–41)
Albumin: 3.9 g/dL (ref 3.5–5.0)
Alkaline Phosphatase: 84 U/L (ref 38–126)
Anion gap: 8 (ref 5–15)
BUN: 15 mg/dL (ref 8–23)
CO2: 28 mmol/L (ref 22–32)
Calcium: 9.5 mg/dL (ref 8.9–10.3)
Chloride: 107 mmol/L (ref 98–111)
Creatinine, Ser: 1.22 mg/dL — ABNORMAL HIGH (ref 0.44–1.00)
GFR calc Af Amer: 48 mL/min — ABNORMAL LOW (ref >60)
GFR calc non Af Amer: 41 mL/min — ABNORMAL LOW (ref >60)
Glucose, Bld: 98 mg/dL (ref 70–99)
Potassium: 3.8 mmol/L (ref 3.5–5.1)
Sodium: 143 mmol/L (ref 135–145)
Total Bilirubin: 1.1 mg/dL (ref 0.3–1.2)
Total Protein: 7.4 g/dL (ref 6.5–8.1)

## 2018-11-25 LAB — CBC WITH DIFFERENTIAL/PLATELET
Abs Immature Granulocytes: 0.01 10*3/uL (ref 0.00–0.07)
Basophils Absolute: 0 10*3/uL (ref 0.0–0.1)
Basophils Relative: 1 %
Eosinophils Absolute: 0.1 10*3/uL (ref 0.0–0.5)
Eosinophils Relative: 3 %
HCT: 43.3 % (ref 36.0–46.0)
Hemoglobin: 13.5 g/dL (ref 12.0–15.0)
Immature Granulocytes: 0 %
Lymphocytes Relative: 35 %
Lymphs Abs: 1.5 10*3/uL (ref 0.7–4.0)
MCH: 31.5 pg (ref 26.0–34.0)
MCHC: 31.2 g/dL (ref 30.0–36.0)
MCV: 100.9 fL — ABNORMAL HIGH (ref 80.0–100.0)
Monocytes Absolute: 0.5 10*3/uL (ref 0.1–1.0)
Monocytes Relative: 12 %
Neutro Abs: 2.2 10*3/uL (ref 1.7–7.7)
Neutrophils Relative %: 49 %
Platelets: 278 10*3/uL (ref 150–400)
RBC: 4.29 MIL/uL (ref 3.87–5.11)
RDW: 15.1 % (ref 11.5–15.5)
WBC: 4.4 10*3/uL (ref 4.0–10.5)
nRBC: 0 % (ref 0.0–0.2)

## 2018-11-25 LAB — URINALYSIS, ROUTINE W REFLEX MICROSCOPIC
Bilirubin Urine: NEGATIVE
Glucose, UA: NEGATIVE mg/dL
Ketones, ur: NEGATIVE mg/dL
Leukocytes,Ua: NEGATIVE
Nitrite: NEGATIVE
Protein, ur: NEGATIVE mg/dL
Specific Gravity, Urine: 1.004 — ABNORMAL LOW (ref 1.005–1.030)
pH: 7 (ref 5.0–8.0)

## 2018-11-25 LAB — GLUCOSE, CAPILLARY: Glucose-Capillary: 100 mg/dL — ABNORMAL HIGH (ref 70–99)

## 2018-11-25 LAB — TROPONIN I (HIGH SENSITIVITY): Troponin I (High Sensitivity): 8 ng/L (ref <18)

## 2018-11-25 MED ORDER — GABAPENTIN 300 MG PO CAPS
300.0000 mg | ORAL_CAPSULE | Freq: Two times a day (BID) | ORAL | Status: DC
  Administered 2018-11-25 – 2018-11-27 (×4): 300 mg via ORAL
  Filled 2018-11-25 (×4): qty 1

## 2018-11-25 MED ORDER — HYDRALAZINE HCL 20 MG/ML IJ SOLN
10.0000 mg | Freq: Four times a day (QID) | INTRAMUSCULAR | Status: DC | PRN
  Administered 2018-11-25: 10 mg via INTRAVENOUS
  Filled 2018-11-25: qty 1

## 2018-11-25 MED ORDER — PANTOPRAZOLE SODIUM 40 MG IV SOLR
40.0000 mg | Freq: Every day | INTRAVENOUS | Status: DC
  Administered 2018-11-26 (×2): 40 mg via INTRAVENOUS
  Filled 2018-11-25 (×3): qty 40

## 2018-11-25 MED ORDER — METOPROLOL TARTRATE 25 MG PO TABS: 37.5000 mg | ORAL_TABLET | Freq: Two times a day (BID) | ORAL | Status: DC

## 2018-11-25 MED ORDER — CYCLOSPORINE 0.05 % OP EMUL: 1.0000 [drp] | Freq: Two times a day (BID) | OPHTHALMIC | Status: DC | PRN

## 2018-11-25 MED ORDER — LINACLOTIDE 72 MCG PO CAPS
72.0000 ug | ORAL_CAPSULE | Freq: Every day | ORAL | Status: DC | PRN
  Filled 2018-11-25: qty 1

## 2018-11-25 MED ORDER — SENNOSIDES-DOCUSATE SODIUM 8.6-50 MG PO TABS
1.0000 | ORAL_TABLET | Freq: Two times a day (BID) | ORAL | Status: DC
  Administered 2018-11-25 – 2018-11-27 (×4): 1 via ORAL
  Filled 2018-11-25 (×4): qty 1

## 2018-11-25 MED ORDER — ATORVASTATIN CALCIUM 40 MG PO TABS
80.0000 mg | ORAL_TABLET | Freq: Every day | ORAL | Status: DC
  Administered 2018-11-25 – 2018-11-27 (×3): 80 mg via ORAL
  Filled 2018-11-25 (×3): qty 2

## 2018-11-25 MED ORDER — ACETAMINOPHEN 325 MG PO TABS
650.0000 mg | ORAL_TABLET | ORAL | Status: DC | PRN
  Administered 2018-11-25: 650 mg via ORAL
  Filled 2018-11-25: qty 2

## 2018-11-25 MED ORDER — STROKE: EARLY STAGES OF RECOVERY BOOK
Freq: Once | Status: AC
  Administered 2018-11-25: 22:00:00

## 2018-11-25 MED ORDER — ACETAMINOPHEN 160 MG/5ML PO SOLN: 650.0000 mg | ORAL | Status: DC | PRN

## 2018-11-25 MED ORDER — ACETAMINOPHEN 650 MG RE SUPP: 650.0000 mg | RECTAL | Status: DC | PRN

## 2018-11-25 MED ORDER — METOPROLOL TARTRATE 25 MG PO TABS
37.5000 mg | ORAL_TABLET | Freq: Two times a day (BID) | ORAL | Status: DC
  Administered 2018-11-25 – 2018-11-27 (×4): 37.5 mg via ORAL
  Filled 2018-11-25 (×5): qty 2

## 2018-11-25 NOTE — ED Provider Notes (Signed)
[facility] EMERGENCY DEPARTMENT Provider Note   CSN: [id number] Arrival date & time: 11/25/18  1239     History   Chief Complaint Chief Complaint  Patient presents with  . Weakness    HPI [name] is a 84 y.o. female.     Patient is an 84 year old female with past medical history of colon cancer, hypertension, pulmonary embolism on Eliquis, hyperlipidemia, stroke, hypothyroidism who presents the emergency department for an episode of confusion today.  Patient presents with her daughter.  Patient reports that currently she is feeling fine and her normal self.  Reports that just prior to calling EMS she had an episode where she was trying to go from sitting to standing.  She used her arms to lift herself up but her arms began to shake and she felt like she could not move and was lightheaded.  The daughter states that her arms were shaking and she was not responding to her for about 30 seconds.  The patient reports that during this time she could hear her daughter talking to her but just could not respond.  She denies any focal weakness, slurred speech, facial asymmetry.  Denies any chest pain, shortness of breath.  She has been taking her medication as prescribed.  Her daughter states that this episode reminded her of when her father used to have orthostatic hypotension and became orthostatic when he tried to stand.     Past Medical History:  Diagnosis Date  . Allergy   . Arthritis   . Colon cancer ([place])    colon ca dx 07/30/09  . History of cardiac monitoring 07/2017   "Event monitor demonstrated sinus rhythm with isolated PACs and no arrhythmias"  . History of colon cancer 06/2009   found at time of TCS 06/29/09, 1.2cm sessile cecal polyp, no adjuvent therapy needed  . HTN (hypertension)   . Hx of cardiovascular stress test 07/2017   "No diagnostic ST segment changes to indicate ischemia. Small, moderate intensity, reversible apical to basal inferolateral defect  consistent with ischemia. This is a low risk study. Nuclear stress EF: 84%."  . Hyperlipidemia   . Hypothyroidism   . PE (pulmonary thromboembolism) ([place])   . Renal disorder    cyst on kidney   . Stroke ([place])   . Vertigo     Patient Active Problem List   Diagnosis Date Noted  . TIA (transient ischemic attack) 11/25/2018  . Silent micro-hemorrhage of brain ([place]) 11/25/2018  . PVC (premature ventricular contraction) 11/16/2018  . DDD (degenerative disc disease), cervical 08/16/2018  . Hyperlipemia 08/16/2018  . History of pulmonary embolism 04/26/2018  . Insomnia 12/15/2015  . Paresthesia of left upper and lower extremity 11/21/2015  . Left leg weakness 11/21/2015  . Paresthesias   . Essential hypertension 11/02/2015  . Hypothyroidism 11/02/2015  . Vertigo 11/02/2015  . CKD (chronic kidney disease), stage III ([place]) 11/02/2015  . Loss of weight 11/02/2015  . OA (osteoarthritis) of knee 11/02/2015  . Colon cancer ([place]) 02/28/2011  . History of colon cancer 06/29/2010  . Constipation 06/10/2009    Past Surgical History:  Procedure Laterality Date  . ABDOMINAL HYSTERECTOMY    . COLON SURGERY  07/2009   right hemicolectomy, no residual colon cancer on path  . COLONOSCOPY  07/16/2010   [id number] POLYP-TCS 3 YEARS  . COLONOSCOPY N/A 08/02/2013   Procedure: COLONOSCOPY;  Surgeon: Danie Binder, MD;  Location: [facility] ENDO SUITE;  Service: Endoscopy;  Laterality: N/A;  9:30  .  PARTIAL HYSTERECTOMY    . PARTIAL THYMECTOMY    . partial thyroidectomy     benign tumors     OB History    Gravida  3   Para  2   Term  1   Preterm  1   AB  1   Living  2     SAB  1   TAB      Ectopic      Multiple      Live Births               Home Medications    Prior to Admission medications   Medication Sig Start Date End Date Taking? Authorizing Provider  acetaminophen (TYLENOL) 500 MG tablet Take 1,000 mg by mouth every 8 (eight) hours as needed (cold).   Yes  [provider]  apixaban (ELIQUIS) 2.5 MG TABS tablet Take 1 tablet (2.5 mg total) by mouth 2 (two) times daily. 10/05/18  Yes Lockamy, Randi L, NP-C  atorvastatin (LIPITOR) 40 MG tablet Take 2 tablets (80 mg total) by mouth daily. 08/22/18  Yes [place], Modena Nunnery, MD  cholecalciferol (VITAMIN D3) 25 MCG (1000 UT) tablet Take 1,000 Units by mouth daily.    Yes [provider]  gabapentin (NEURONTIN) 300 MG capsule Take 1 in the morning and 1 in the evening Patient taking differently: Take 300 mg by mouth 2 (two) times daily.  10/26/18  Yes [place], Modena Nunnery, MD  linaclotide (LINZESS) 72 MCG capsule TAKE 1 CAPSULE BY MOUTH DAILY BEFORE BREAKFAST. Patient taking differently: Take 72 mcg by mouth daily as needed (constipation).  02/28/18  Yes [place], Modena Nunnery, MD  metoprolol tartrate (LOPRESSOR) 25 MG tablet Take 1.5 tablets (37.5 mg total) by mouth 2 (two) times daily. 11/13/18  Yes Strader, [place] M, PA-C  RESTASIS 0.05 % ophthalmic emulsion Place 1 drop into both eyes 2 (two) times daily as needed (chronic dry eye).  05/17/10  Yes [provider]    Family History Family History  Problem Relation Age of Onset  . Colon cancer Mother        >age60  . Arthritis Mother   . Cancer Mother   . Heart disease Mother   . Hyperlipidemia Mother   . Hypertension Mother   . Heart attack Father   . Heart disease Father   . Diabetes Maternal Aunt   . Hyperlipidemia Daughter   . Hypertension Daughter   . Liver disease Neg Hx     Social History Social History   Tobacco Use  . Smoking status: Never Smoker  . Smokeless tobacco: Never Used  Substance Use Topics  . Alcohol use: No    Alcohol/week: 0.0 standard drinks  . Drug use: No     Allergies   Patient has no known allergies.   Review of Systems Review of Systems  Constitutional: Negative.  Negative for chills and fever.  HENT: Negative for ear pain and sore throat.   Eyes: Negative for pain and visual  disturbance.  Respiratory: Negative for cough and shortness of breath.   Cardiovascular: Negative for chest pain and palpitations.  Gastrointestinal: Negative for abdominal pain, nausea and vomiting.  Genitourinary: Negative for dysuria, hematuria and pelvic pain.  Musculoskeletal: Negative for arthralgias, back pain, joint swelling, myalgias and neck pain.  Skin: Negative for color change and rash.  Neurological: Positive for tremors, speech difficulty, weakness and light-headedness. Negative for dizziness, seizures, syncope, facial asymmetry, numbness and headaches.  All other systems reviewed  and are negative.    Physical Exam Updated Vital Signs BP (!) 190/102 (BP Location: Right Arm)   Pulse 66   Temp 98.8 F (37.1 C)   Resp 16   Ht 5\' 3"  (1.6 m)   Wt 71.2 kg   SpO2 96%   BMI 27.81 kg/m   Physical Exam Vitals signs and nursing note reviewed.  Constitutional:      Appearance: Normal appearance.  HENT:     Head: Normocephalic.     Nose: Nose normal.     Mouth/Throat:     Mouth: Mucous membranes are moist.  Eyes:     Extraocular Movements: Extraocular movements intact.     Conjunctiva/sclera: Conjunctivae normal.     Pupils: Pupils are equal, round, and reactive to light.  Cardiovascular:     Rate and Rhythm: Normal rate. Rhythm irregular.     Heart sounds: Gallop present.   Pulmonary:     Effort: Pulmonary effort is normal.  Abdominal:     General: Abdomen is flat. There is no distension.     Tenderness: There is no abdominal tenderness.  Skin:    General: Skin is dry.     Capillary Refill: Capillary refill takes less than 2 seconds.  Neurological:     General: No focal deficit present.     Mental Status: She is alert and oriented to person, place, and time.     Cranial Nerves: No cranial nerve deficit.     Sensory: No sensory deficit.     Motor: No weakness.     Deep Tendon Reflexes: Reflexes normal.  Psychiatric:        Mood and Affect: Mood normal.       ED Treatments / Results  Labs (all labs ordered are listed, but only abnormal results are displayed) Labs Reviewed  CBC WITH DIFFERENTIAL/PLATELET - Abnormal; Notable for the following components:      Result Value   MCV 100.9 (*)    All other components within normal limits  COMPREHENSIVE METABOLIC PANEL - Abnormal; Notable for the following components:   Creatinine, Ser 1.22 (*)    GFR calc non Af Amer 41 (*)    GFR calc Af Amer 48 (*)    All other components within normal limits  URINALYSIS, ROUTINE W REFLEX MICROSCOPIC - Abnormal; Notable for the following components:   Color, Urine STRAW (*)    Specific Gravity, Urine 1.004 (*)    Hgb urine dipstick SMALL (*)    Bacteria, UA RARE (*)    All other components within normal limits  GLUCOSE, CAPILLARY - Abnormal; Notable for the following components:   Glucose-Capillary 100 (*)    All other components within normal limits  SARS CORONAVIRUS 2 (TAT 6-24 HRS)  TROPONIN I (HIGH SENSITIVITY)    EKG EKG Interpretation  Date/Time:  Sunday November 25 2018 13:00:31 EDT Ventricular Rate:  64 PR Interval:    QRS Duration: 89 QT Interval:  389 QTC Calculation: 402 R Axis:   -32 Text Interpretation:  Sinus rhythm Probable left atrial enlargement Left axis deviation Borderline T abnormalities, lateral leads When compared with ECG of 08/08/2018 No significant change was found Confirmed by Francine Graven [phone]) on 11/25/2018 2:16:32 PM   Radiology Dg Chest 2 View  Result Date: 11/25/2018 CLINICAL DATA:  Patient with generalized weakness.  Dizziness. EXAM: CHEST - 2 VIEW COMPARISON:  Chest radiograph 08/08/2018 FINDINGS: Monitoring leads overlie the patient. Cardiac contours upper limits of normal. No large area pulmonary consolidation.  No pleural effusion or pneumothorax. IMPRESSION: No acute cardiopulmonary process. Electronically Signed   By: Lovey Newcomer M.D.   On: 11/25/2018 14:36   Ct Head Wo Contrast  Result Date:  11/25/2018 CLINICAL DATA:  Dizziness.  Generalized weakness. EXAM: CT HEAD WITHOUT CONTRAST TECHNIQUE: Contiguous axial images were obtained from the base of the skull through the vertex without intravenous contrast. COMPARISON:  Brain CT 07/17/2017 FINDINGS: Brain: Ventricles and sulci are prominent compatible with atrophy. Periventricular and subcortical white matter hypodensities compatible with chronic microvascular ischemic changes. No evidence for acute cortically based infarct, mass lesion or significant mass effect. There is a 4 mm focus of high attenuation within the anterior right frontal lobe white matter (image 18; series 2), most compatible with small intraparenchymal hemorrhage. Vascular: Unremarkable Skull: Intact Sinuses/Orbits: Paranasal sinuses well aerated. Mastoid air cells are unremarkable. Orbits unremarkable. Other: None. IMPRESSION: Small (4 mm) focus of high attenuation within the anterior right frontal lobe white matter favored to represent focus of intraparenchymal blood products. Atrophy and chronic microvascular ischemic changes. Critical Value/emergent results were called by telephone at the time of interpretation on 11/25/2018 at 3:25 pm to providerZackowski, MD, who verbally acknowledged these results. Electronically Signed   By: Lovey Newcomer M.D.   On: 11/25/2018 15:27    Procedures Procedures (including critical care time)  Medications Ordered in ED Medications  metoprolol tartrate (LOPRESSOR) tablet 37.5 mg (37.5 mg Oral Given 11/25/18 1749)  atorvastatin (LIPITOR) tablet 80 mg (has no administration in time range)  linaclotide (LINZESS) capsule 72 mcg (has no administration in time range)  gabapentin (NEURONTIN) capsule 300 mg (has no administration in time range)  cycloSPORINE (RESTASIS) 0.05 % ophthalmic emulsion 1 drop (has no administration in time range)   stroke: mapping our early stages of recovery book (has no administration in time range)  acetaminophen  (TYLENOL) tablet 650 mg (has no administration in time range)    Or  acetaminophen (TYLENOL) solution 650 mg (has no administration in time range)    Or  acetaminophen (TYLENOL) suppository 650 mg (has no administration in time range)  senna-docusate (Senokot-S) tablet 1 tablet (has no administration in time range)  pantoprazole (PROTONIX) injection 40 mg (has no administration in time range)     Initial Impression / Assessment and Plan / ED Course  I have reviewed the triage vital signs and the nursing notes.  Pertinent labs & imaging results that were available during my care of the patient were reviewed by me and considered in my medical decision making (see chart for details).  Clinical Course as of Nov 25 2118  Nancy Fetter Nov 25, 2018  1600 I consulted with neuro who will evaluate her for what appears to be TIA with possible frontal lobe blood products on CT scan. Patient currently asymptomatic still. Lab work and chest xray unremarkable.    [KM]  [id number] I spoke with teleneurologist Dr. Frazier Butt. at this time. He recommends hold Eloquis, repeat Ct scan in 12 hours and also obtain MRI tomorrow. Possible new punctate bleed. Admit to hospitalist Dr. Nehemiah Settle to admit to hospitalist service   [KM]    Clinical Course User Index [KM] Alveria Apley, PA-C       Patient admitted for further workup  Final Clinical Impressions(s) / ED Diagnoses   Final diagnoses:  TIA (transient ischemic attack)    ED Discharge Orders    None       Kristine Royal 11/25/18 2120    Francine Graven, DO  11/27/18 1120  

## 2018-11-25 NOTE — H&P (Signed)
History and Physical  [name] [id number] DOB: 15-Apr-1934 DOA: 11/25/2018  Referring physician: Madilyn Hook, PA-C,  ED provider PCP: Alycia Rossetti, MD  Outpatient Specialists:   Patient Coming From: home  Chief Complaint: Brief episode of unresponsiveness  HPI: [name] is a 84 y.o. female with a history of colon cancer diagnosed in 2011 status post polypectomy, hypertension, hypothyroidism, pulmonary embolism earlier this year on anticoagulation therapy.  Patient seen for a brief episode of unresponsiveness that lasted approximately 30 seconds earlier this morning.  Patient was conscious throughout the episode.  Patient was trying to lift herself out of a chair when her arms began to shake and she was unable to respond or move for approximately 30 seconds.  No focal weakness, numbness, slurred speech, vision changes.  She has had no prior episodes.  Of an MRI on 8/25 due to headache with weakness and vertigo.  The MRI showed 5 micro hemorrhages that is thought to be the result of chronic hypertensive angiopathy.  No palliating or provoking factors.  Patient feels fine now.  Emergency Department Course: CT of the head shows a 4 mm microhemorrhage in.  Review of Systems:   Pt denies any fevers, chills, nausea, vomiting, diarrhea, constipation, abdominal pain, shortness of breath, dyspnea on exertion, orthopnea, cough, wheezing, palpitations, headache, vision changes, lightheadedness, dizziness, melena, rectal bleeding.  Review of systems are otherwise negative  Past Medical History:  Diagnosis Date  . Allergy   . Arthritis   . Colon cancer ([place])    colon ca dx 07/30/09  . History of cardiac monitoring 07/2017   "Event monitor demonstrated sinus rhythm with isolated PACs and no arrhythmias"  . History of colon cancer 06/2009   found at time of TCS 06/29/09, 1.2cm sessile cecal polyp, no adjuvent therapy needed  . HTN (hypertension)   . Hx of  cardiovascular stress test 07/2017   "No diagnostic ST segment changes to indicate ischemia. Small, moderate intensity, reversible apical to basal inferolateral defect consistent with ischemia. This is a low risk study. Nuclear stress EF: 84%."  . Hyperlipidemia   . Hypothyroidism   . PE (pulmonary thromboembolism) ([place])   . Renal disorder    cyst on kidney   . Stroke ([place])   . Vertigo    Past Surgical History:  Procedure Laterality Date  . ABDOMINAL HYSTERECTOMY    . COLON SURGERY  07/2009   right hemicolectomy, no residual colon cancer on path  . COLONOSCOPY  07/16/2010   [id number] POLYP-TCS 3 YEARS  . COLONOSCOPY N/A 08/02/2013   Procedure: COLONOSCOPY;  Surgeon: Danie Binder, MD;  Location: [facility];  Service: Endoscopy;  Laterality: N/A;  9:30  . PARTIAL HYSTERECTOMY    . PARTIAL THYMECTOMY    . partial thyroidectomy     benign tumors   Social History:  reports that she has never smoked. She has never used smokeless tobacco. She reports that she does not drink alcohol or use drugs. Patient lives at home  No Known Allergies  Family History  Problem Relation Age of Onset  . Colon cancer Mother        >age60  . Arthritis Mother   . Cancer Mother   . Heart disease Mother   . Hyperlipidemia Mother   . Hypertension Mother   . Heart attack Father   . Heart disease Father   . Diabetes Maternal Aunt   . Hyperlipidemia Daughter   . Hypertension Daughter   . Liver  disease Neg Hx       Prior to Admission medications   Medication Sig Start Date End Date Taking? Authorizing Provider  acetaminophen (TYLENOL) 500 MG tablet Take 1,000 mg by mouth every 8 (eight) hours as needed (cold).   Yes [provider]  apixaban (ELIQUIS) 2.5 MG TABS tablet Take 1 tablet (2.5 mg total) by mouth 2 (two) times daily. 10/05/18  Yes Lockamy, Randi L, NP-C  atorvastatin (LIPITOR) 40 MG tablet Take 2 tablets (80 mg total) by mouth daily. 08/22/18  Yes [place], Modena Nunnery, MD   cholecalciferol (VITAMIN D3) 25 MCG (1000 UT) tablet Take 1,000 Units by mouth daily.    Yes [provider]  gabapentin (NEURONTIN) 300 MG capsule Take 1 in the morning and 1 in the evening Patient taking differently: Take 300 mg by mouth 2 (two) times daily.  10/26/18  Yes [place], Modena Nunnery, MD  linaclotide (LINZESS) 72 MCG capsule TAKE 1 CAPSULE BY MOUTH DAILY BEFORE BREAKFAST. Patient taking differently: Take 72 mcg by mouth daily as needed (constipation).  02/28/18  Yes [place], Modena Nunnery, MD  metoprolol tartrate (LOPRESSOR) 25 MG tablet Take 1.5 tablets (37.5 mg total) by mouth 2 (two) times daily. 11/13/18  Yes Strader, [place] M, PA-C  RESTASIS 0.05 % ophthalmic emulsion Place 1 drop into both eyes 2 (two) times daily as needed (chronic dry eye).  05/17/10  Yes [provider]    Physical Exam: BP (!) 191/91   Pulse 65   Temp 99.4 F (37.4 C) (Rectal)   Resp (!) 24   Ht 5\' 3"  (1.6 m)   Wt 71.2 kg   SpO2 96%   BMI 27.81 kg/m   . General: Elderly female. Awake and alert and oriented x3. No acute cardiopulmonary distress.  Marland Kitchen HEENT: Normocephalic atraumatic.  Right and left ears normal in appearance.  Pupils equal, round, reactive to light. Extraocular muscles are intact. Sclerae anicteric and noninjected.  Moist mucosal membranes. No mucosal lesions.  . Neck: Neck supple without lymphadenopathy. No carotid bruits. No masses palpated.  . Cardiovascular: Regular rate with normal S1-S2 sounds. No murmurs, rubs, gallops auscultated. No JVD.  Marland Kitchen Respiratory: Good respiratory effort with no wheezes, rales, rhonchi. Lungs clear to auscultation bilaterally.  No accessory muscle use. . Abdomen: Soft, nontender, nondistended. Active bowel sounds. No masses or hepatosplenomegaly  . Skin: No rashes, lesions, or ulcerations.  Dry, warm to touch. 2+ dorsalis pedis and radial pulses. . Musculoskeletal: No calf or leg pain. All major joints not erythematous nontender.  No upper or  lower joint deformation.  Good ROM.  No contractures  . Psychiatric: Intact judgment and insight. Pleasant and cooperative. . Neurologic: No focal neurological deficits. Strength is 5/5 and symmetric in upper and lower extremities.  Cranial nerves II through XII are grossly intact.           Labs on Admission: I have personally reviewed following labs and imaging studies  CBC: Recent Labs  Lab 11/25/18 1351  WBC 4.4  NEUTROABS 2.2  HGB 13.5  HCT 43.3  MCV 100.9*  PLT 0000000   Basic Metabolic Panel: Recent Labs  Lab 11/25/18 1351  NA 143  K 3.8  CL 107  CO2 28  GLUCOSE 98  BUN 15  CREATININE 1.22*  CALCIUM 9.5   GFR: Estimated Creatinine Clearance: 33.6 mL/min (A) (by C-G formula based on SCr of 1.22 mg/dL (H)). Liver Function Tests: Recent Labs  Lab 11/25/18 1351  AST 21  ALT 19  ALKPHOS 84  BILITOT 1.1  PROT 7.4  ALBUMIN 3.9   No results for input(s): LIPASE, AMYLASE in the last 168 hours. No results for input(s): AMMONIA in the last 168 hours. Coagulation Profile: No results for input(s): INR, PROTIME in the last 168 hours. Cardiac Enzymes: No results for input(s): CKTOTAL, CKMB, CKMBINDEX, TROPONINI in the last 168 hours. BNP (last 3 results) No results for input(s): PROBNP in the last 8760 hours. HbA1C: No results for input(s): HGBA1C in the last 72 hours. CBG: No results for input(s): GLUCAP in the last 168 hours. Lipid Profile: No results for input(s): CHOL, HDL, LDLCALC, TRIG, CHOLHDL, LDLDIRECT in the last 72 hours. Thyroid Function Tests: No results for input(s): TSH, T4TOTAL, FREET4, T3FREE, THYROIDAB in the last 72 hours. Anemia Panel: No results for input(s): VITAMINB12, FOLATE, FERRITIN, TIBC, IRON, RETICCTPCT in the last 72 hours. Urine analysis:    Component Value Date/Time   COLORURINE STRAW (A) 11/25/2018 1308   APPEARANCEUR CLEAR 11/25/2018 1308   LABSPEC 1.004 (L) 11/25/2018 1308   PHURINE 7.0 11/25/2018 1308   GLUCOSEU NEGATIVE  11/25/2018 1308   HGBUR SMALL (A) 11/25/2018 1308   BILIRUBINUR NEGATIVE 11/25/2018 1308   KETONESUR NEGATIVE 11/25/2018 1308   PROTEINUR NEGATIVE 11/25/2018 1308   UROBILINOGEN 0.2 03/12/2012 0022   NITRITE NEGATIVE 11/25/2018 1308   LEUKOCYTESUR NEGATIVE 11/25/2018 1308   Sepsis Labs: @LABRCNTIP (procalcitonin:4,lacticidven:4) )No results found for this or any previous visit (from the past 240 hour(s)).   Radiological Exams on Admission: Dg Chest 2 View  Result Date: 11/25/2018 CLINICAL DATA:  Patient with generalized weakness.  Dizziness. EXAM: CHEST - 2 VIEW COMPARISON:  Chest radiograph 08/08/2018 FINDINGS: Monitoring leads overlie the patient. Cardiac contours upper limits of normal. No large area pulmonary consolidation. No pleural effusion or pneumothorax. IMPRESSION: No acute cardiopulmonary process. Electronically Signed   By: Lovey Newcomer M.D.   On: 11/25/2018 14:36   Ct Head Wo Contrast  Result Date: 11/25/2018 CLINICAL DATA:  Dizziness.  Generalized weakness. EXAM: CT HEAD WITHOUT CONTRAST TECHNIQUE: Contiguous axial images were obtained from the base of the skull through the vertex without intravenous contrast. COMPARISON:  Brain CT 07/17/2017 FINDINGS: Brain: Ventricles and sulci are prominent compatible with atrophy. Periventricular and subcortical white matter hypodensities compatible with chronic microvascular ischemic changes. No evidence for acute cortically based infarct, mass lesion or significant mass effect. There is a 4 mm focus of high attenuation within the anterior right frontal lobe white matter (image 18; series 2), most compatible with small intraparenchymal hemorrhage. Vascular: Unremarkable Skull: Intact Sinuses/Orbits: Paranasal sinuses well aerated. Mastoid air cells are unremarkable. Orbits unremarkable. Other: None. IMPRESSION: Small (4 mm) focus of high attenuation within the anterior right frontal lobe white matter favored to represent focus of  intraparenchymal blood products. Atrophy and chronic microvascular ischemic changes. Critical Value/emergent results were called by telephone at the time of interpretation on 11/25/2018 at 3:25 pm to providerZackowski, MD, who verbally acknowledged these results. Electronically Signed   By: Lovey Newcomer M.D.   On: 11/25/2018 15:27    Assessment/Plan: Principal Problem:   TIA (transient ischemic attack) Active Problems:   Essential hypertension   Hypothyroidism   CKD (chronic kidney disease), stage III (HCC)   Acute pulmonary embolism (HCC)   Silent micro-hemorrhage of brain [facility])    This patient was discussed with the ED physician, including pertinent vitals, physical exam findings, labs, and imaging.  We also discussed care given by the ED provider.  1. TIA a. Resolved b.  MRI/MRA c. Swallow evaluation done d. PT/OT 2. Microhemorrhage of the brain  a. Hold Eliquis b. Per neurology repeat CT in the morning c. We will get formal in person neurology consult 3. History of pulmonary embolism a. Will hold Eliquis 4. CKD a. Stable 5. Hypothyroidism a. Stable b. Continue outpatient regimen 6. Hypertension a. Continue blood pressure control  DVT prophylaxis: SCDs Consultants: Neurology Code Status: DNR per patient Family Communication: None Disposition Plan: Patient should be able to return home following evaluation   Truett Mainland, DO

## 2018-11-25 NOTE — ED Triage Notes (Signed)
PT brought in by [facility] from home this am and daughter at bedside states she spoke with her mom last night around 2200 before she went to bed and she was feeling fine. Daughter states this morning pt reports when she got out of bed this am she felt dizzy with generalized weakness.

## 2018-11-25 NOTE — Consult Note (Signed)
[facility] TeleNeurology Consult Services  Stat Consult  Date of Service:   11/25/2018 15:56:06  Impression:     .  Transient Ischemic Attack     .  right frontal intraparenchymal microhemorrhage  Comments/Sign-Out: 84yo W w/PMH of colon cancer, PE on Eliquis, CVA, HLD, HTN, hypothyroidism, cerebral microhemorrhages p/w a brief episode of AMS and generalized weakness on 11/25/18. She was dizzy this morning and her BP was elevated. Daughter reports patient used her arms to lift herself up but her arms began to shake and she felt like she could not move and was lightheaded. The daughter states that she was not responding to her for about 30 seconds. Pt was conscious through the episode. She denies any focal weakness, numbness, slurred speech, vision change. She currently denies complaints, no prior episodes. NIHSS 0, exam normal. CTH shows small 63mm acute microhemorrhage in the right frontal lobe. 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 The episode is most likely a TIA, possibly related to orthostatic change in blood pressure. HTN encephalopathy is another possibillity.  CT HEAD: Reviewed Small (4 mm) focus of high attenuation within the anterior right frontal lobe white matter favored to represent focus of intraparenchymal blood products  Metrics: [facility] Notification Time: 11/25/2018 15:54:40 Stamp Time: 11/25/2018 15:56:06 Callback Response Time: 11/25/2018 15:58:38 Video Start Time: 11/25/2018 16:30:00 Video End Time: 11/25/2018 16:56:07  Our recommendations are outlined below.  Recommendations:     .  Hold Eliquis and repeat CTH in 12 hours     .  Consider neurosurgery evaluation if hemorrhage enlarges     .  Atorvastatin 40mg  po daily     .  Check orthostatics     .  Avoid hypotonic solutions, only use normal saline or  hypertonic saline if indicated     .  Maintain SBP goal of 140-180, MAP >90     .  Maintain Na >140     .  Maintain Head of Bed >30 degrees to decrease ICP     .  GI prophylaxis     .  DVT Prophylaxis with intermittent pneumatic compression     .  Frequent Neuro Checks     .  Temperature control (Tylenol prn for temp >100.4)     .  MRI Brain, MRA Head and Neck w/o contrast     .  STAT CTH for any acute changes in mental status or exam     .  NPO until cleared by Speech/Swallow evaluation     .  PT/OT evaluation when stable   Disposition: Neurology Follow Up Recommended  Sign Out:     .  Discussed with Emergency Department Provider  ----------------------------------------------------------------------------------------------------  Chief Complaint: AMS, generalized weakness.  History of Present Illness: Patient is a 84 year old Female.  84yo W w/PMH of colon cancer, PE on Eliquis, CVA, HLD, HTN, hypothyroidism, cerebral microhemorrhages p/w a brief episode of AMS and generalized weakness on 11/25/18. She was dizzy this morning and her BP was elevated. Daughter reports patient used her arms to lift herself up but her arms began to shake and she felt like she could not move and was lightheaded. The daughter states that she was not responding to her for about 30 seconds. Pt was conscious through the episode. She denies any focal weakness, numbness, slurred speech, vision change. She currently denies complaints, no prior episodes.   Past  Medical History:     . Hypertension     . Hyperlipidemia     . Stroke  Anticoagulant use:  eliquis  Antiplatelet use: No Examination: BP(173/93), 1A: Level of Consciousness - Alert; keenly responsive + 0 1B: Ask Month and Age - Both Questions Right + 0 1C: Blink Eyes & Squeeze Hands - Performs Both Tasks + 0 2: Test Horizontal Extraocular Movements - Normal + 0 3: Test Visual Fields - No Visual Loss + 0 4: Test Facial Palsy (Use Grimace if  Obtunded) - Normal symmetry + 0 5A: Test Left Arm Motor Drift - No Drift for 10 Seconds + 0 5B: Test Right Arm Motor Drift - No Drift for 10 Seconds + 0 6A: Test Left Leg Motor Drift - No Drift for 5 Seconds + 0 6B: Test Right Leg Motor Drift - No Drift for 5 Seconds + 0 7: Test Limb Ataxia (FNF/Heel-Shin) - No Ataxia + 0 8: Test Sensation - Normal; No sensory loss + 0 9: Test Language/Aphasia - Normal; No aphasia + 0 10: Test Dysarthria - Normal + 0 11: Test Extinction/Inattention - No abnormality + 0  NIHSS Score: 0  Patient/Family was informed the Neurology Consult would happen via TeleHealth consult by way of interactive audio and video telecommunications and consented to receiving care in this manner.  Due to the immediate potential for life-threatening deterioration due to underlying acute neurologic illness, I spent 35 minutes providing critical care. This time includes time for face to face visit via telemedicine, review of medical records, imaging studies and discussion of findings with providers, the patient and/or family.   Dr Lenard Galloway Otilia Kareem   [facility] [phone]   Case IN:[id number]

## 2018-11-25 NOTE — ED Notes (Signed)
ED TO INPATIENT HANDOFF REPORT  ED Nurse Name and Phone #:  Eustaquio Maize [phone]  S Name/Age/Gender [name] 84 y.o. female Room/Bed: APA06/APA06  Code Status   Code Status: Prior  Home/SNF/Other Home Patient oriented to: self, place, time and situation Is this baseline? Yes   Triage Complete: Triage complete  Chief Complaint weakness  Triage Note PT brought in by [facility] from home this am and daughter at bedside states she spoke with her mom last night around 2200 before she went to bed and she was feeling fine. Daughter states this morning pt reports when she got out of bed this am she felt dizzy with generalized weakness.    Allergies No Known Allergies  Level of Care/Admitting Diagnosis ED Disposition    ED Disposition Condition [facility] Area: [facility] [id number]  Level of Care: Telemetry [5]  Covid Evaluation: Asymptomatic Screening Protocol (No Symptoms)  Diagnosis: TIA (transient ischemic [id number]  Admitting Physician: Truett Mainland [[id number]]  Attending Physician: Truett Mainland [[id number]]  PT Class (Do Not Modify): Observation [104]  PT Acc Code (Do Not Modify): Observation [10022]       B Medical/Surgery History Past Medical History:  Diagnosis Date  . Allergy   . Arthritis   . Colon cancer ([place])    colon ca dx 07/30/09  . History of cardiac monitoring 07/2017   "Event monitor demonstrated sinus rhythm with isolated PACs and no arrhythmias"  . History of colon cancer 06/2009   found at time of TCS 06/29/09, 1.2cm sessile cecal polyp, no adjuvent therapy needed  . HTN (hypertension)   . Hx of cardiovascular stress test 07/2017   "No diagnostic ST segment changes to indicate ischemia. Small, moderate intensity, reversible apical to basal inferolateral defect consistent with ischemia. This is a low risk study. Nuclear stress EF: 84%."  . Hyperlipidemia   . Hypothyroidism   . PE (pulmonary thromboembolism) ([place])   .  Renal disorder    cyst on kidney   . Stroke ([place])   . Vertigo    Past Surgical History:  Procedure Laterality Date  . ABDOMINAL HYSTERECTOMY    . COLON SURGERY  07/2009   right hemicolectomy, no residual colon cancer on path  . COLONOSCOPY  07/16/2010   [id number] POLYP-TCS 3 YEARS  . COLONOSCOPY N/A 08/02/2013   Procedure: COLONOSCOPY;  Surgeon: Danie Binder, MD;  Location: [facility];  Service: Endoscopy;  Laterality: N/A;  9:30  . PARTIAL HYSTERECTOMY    . PARTIAL THYMECTOMY    . partial thyroidectomy     benign tumors     A IV Location/Drains/Wounds Patient Lines/Drains/Airways Status   Active Line/Drains/Airways    Name:   Placement date:   Placement time:   Site:   Days:   Peripheral IV 11/25/18 Anterior;Left;Proximal Forearm   11/25/18    1245    Forearm   less than 1          Intake/Output Last 24 hours No intake or output data in the 24 hours ending 11/25/18 1928  Labs/Imaging Results for orders placed or performed during the hospital encounter of 11/25/18 (from the past 48 hour(s))  Urinalysis, Routine w reflex microscopic     Status: Abnormal   Collection Time: 11/25/18  1:08 PM  Result Value Ref Range   Color, Urine STRAW (A) YELLOW   APPearance CLEAR CLEAR   Specific Gravity, Urine 1.004 (L) 1.005 - 1.030   pH 7.0 5.0 -  8.0   Glucose, UA NEGATIVE NEGATIVE mg/dL   Hgb urine dipstick SMALL (A) NEGATIVE   Bilirubin Urine NEGATIVE NEGATIVE   Ketones, ur NEGATIVE NEGATIVE mg/dL   Protein, ur NEGATIVE NEGATIVE mg/dL   Nitrite NEGATIVE NEGATIVE   Leukocytes,Ua NEGATIVE NEGATIVE   RBC / HPF 0-5 0 - 5 RBC/hpf   WBC, UA 0-5 0 - 5 WBC/hpf   Bacteria, UA RARE (A) NONE SEEN   Squamous Epithelial / LPF 0-5 0 - 5    Comment: Performed at [facility], [address] [place], [place] [address]  CBC with Differential     Status: Abnormal   Collection Time: 11/25/18  1:51 PM  Result Value Ref Range   WBC 4.4 4.0 - 10.5 K/uL   RBC 4.29 3.87 - 5.11 MIL/uL    Hemoglobin 13.5 12.0 - 15.0 g/dL   HCT 43.3 36.0 - 46.0 %   MCV 100.9 (H) 80.0 - 100.0 fL   MCH 31.5 26.0 - 34.0 pg   MCHC 31.2 30.0 - 36.0 g/dL   RDW 15.1 11.5 - 15.5 %   Platelets 278 150 - 400 K/uL   nRBC 0.0 0.0 - 0.2 %   Neutrophils Relative % 49 %   Neutro Abs 2.2 1.7 - 7.7 K/uL   Lymphocytes Relative 35 %   Lymphs Abs 1.5 0.7 - 4.0 K/uL   Monocytes Relative 12 %   Monocytes Absolute 0.5 0.1 - 1.0 K/uL   Eosinophils Relative 3 %   Eosinophils Absolute 0.1 0.0 - 0.5 K/uL   Basophils Relative 1 %   Basophils Absolute 0.0 0.0 - 0.1 K/uL   Immature Granulocytes 0 %   Abs Immature Granulocytes 0.01 0.00 - 0.07 K/uL    Comment: Performed at [facility], [address] [place], [place] [address]  Comprehensive metabolic panel     Status: Abnormal   Collection Time: 11/25/18  1:51 PM  Result Value Ref Range   Sodium 143 135 - 145 mmol/L   Potassium 3.8 3.5 - 5.1 mmol/L   Chloride 107 98 - 111 mmol/L   CO2 28 22 - 32 mmol/L   Glucose, Bld 98 70 - 99 mg/dL   BUN 15 8 - 23 mg/dL   Creatinine, Ser 1.22 (H) 0.44 - 1.00 mg/dL   Calcium 9.5 8.9 - 10.3 mg/dL   Total Protein 7.4 6.5 - 8.1 g/dL   Albumin 3.9 3.5 - 5.0 g/dL   AST 21 15 - 41 U/L   ALT 19 0 - 44 U/L   Alkaline Phosphatase 84 38 - 126 U/L   Total Bilirubin 1.1 0.3 - 1.2 mg/dL   GFR calc non Af Amer 41 (L) >60 mL/min   GFR calc Af Amer 48 (L) >60 mL/min   Anion gap 8 5 - 15    Comment: Performed at [facility], [address], [place], [place] [address]  Troponin I (High Sensitivity)     Status: None   Collection Time: 11/25/18  1:51 PM  Result Value Ref Range   Troponin I (High Sensitivity) 8 <18 ng/L    Comment: (NOTE) Elevated high sensitivity troponin I (hsTnI) values and significant  changes across serial measurements may suggest ACS but many other  chronic and acute conditions are known to elevate hsTnI results.  Refer to the "Links" section for chest pain algorithms and additional  guidance. Performed at  [facility] [place] [facility], [address], [place], [place] [address]    Dg Chest 2 View  Result Date:  11/25/2018 CLINICAL DATA:  Patient with generalized weakness.  Dizziness. EXAM: CHEST - 2 VIEW COMPARISON:  Chest radiograph 08/08/2018 FINDINGS: Monitoring leads overlie the patient. Cardiac contours upper limits of normal. No large area pulmonary consolidation. No pleural effusion or pneumothorax. IMPRESSION: No acute cardiopulmonary process. Electronically Signed   By: Lovey Newcomer M.D.   On: 11/25/2018 14:36   Ct Head Wo Contrast  Result Date: 11/25/2018 CLINICAL DATA:  Dizziness.  Generalized weakness. EXAM: CT HEAD WITHOUT CONTRAST TECHNIQUE: Contiguous axial images were obtained from the base of the skull through the vertex without intravenous contrast. COMPARISON:  Brain CT 07/17/2017 FINDINGS: Brain: Ventricles and sulci are prominent compatible with atrophy. Periventricular and subcortical white matter hypodensities compatible with chronic microvascular ischemic changes. No evidence for acute cortically based infarct, mass lesion or significant mass effect. There is a 4 mm focus of high attenuation within the anterior right frontal lobe white matter (image 18; series 2), most compatible with small intraparenchymal hemorrhage. Vascular: Unremarkable Skull: Intact Sinuses/Orbits: Paranasal sinuses well aerated. Mastoid air cells are unremarkable. Orbits unremarkable. Other: None. IMPRESSION: Small (4 mm) focus of high attenuation within the anterior right frontal lobe white matter favored to represent focus of intraparenchymal blood products. Atrophy and chronic microvascular ischemic changes. Critical Value/emergent results were called by telephone at the time of interpretation on 11/25/2018 at 3:25 pm to providerZackowski, MD, who verbally acknowledged these results. Electronically Signed   By: Lovey Newcomer M.D.   On: 11/25/2018 15:27    Pending Labs Unresulted Labs (From admission, onward)    Start      Ordered   11/25/18 1657  SARS CORONAVIRUS 2 (TAT 6-24 HRS) Nasopharyngeal Nasopharyngeal Swab  (Asymptomatic/Tier 2 Patients Labs)  Once,   STAT    Question Answer Comment  Is this test for diagnosis or screening Screening   Symptomatic for COVID-19 as defined by CDC No   Hospitalized for COVID-19 No   Admitted to ICU for COVID-19 No   Previously tested for COVID-19 No   Resident in a congregate (group) care setting No   Employed in healthcare setting No   Pregnant No      11/25/18 1657          Vitals/Pain Today's Vitals   11/25/18 1700 11/25/18 1744 11/25/18 1800 11/25/18 1830  BP: (!) 191/84 (!) 191/91 (!) 184/86 (!) 194/110  Pulse: 61 65 62 66  Resp: 18 (!) 24 15 15   Temp:      TempSrc:      SpO2: 96% 96% 95% 95%  Weight:      Height:      PainSc:        Isolation Precautions No active isolations  Medications Medications  metoprolol tartrate (LOPRESSOR) tablet 37.5 mg (37.5 mg Oral Given 11/25/18 1749)    Mobility walks with device-walks with cane  Moderate fall risk   Focused Assessments    R Recommendations: See Admitting Provider Note  Report given to:   Additional Notes:

## 2018-11-26 ENCOUNTER — Observation Stay (HOSPITAL_COMMUNITY): Payer: Medicare Other

## 2018-11-26 DIAGNOSIS — I619 Nontraumatic intracerebral hemorrhage, unspecified: Secondary | ICD-10-CM | POA: Diagnosis not present

## 2018-11-26 DIAGNOSIS — I618 Other nontraumatic intracerebral hemorrhage: Secondary | ICD-10-CM

## 2018-11-26 LAB — SARS CORONAVIRUS 2 (TAT 6-24 HRS): SARS Coronavirus 2: NEGATIVE

## 2018-11-26 MED ORDER — LABETALOL HCL 5 MG/ML IV SOLN: 10.0000 mg | INTRAVENOUS | Status: DC | PRN

## 2018-11-26 NOTE — Plan of Care (Signed)
  Problem: Acute Rehab PT Goals(only PT should resolve) Goal: Pt Will Ambulate Outcome: Progressing Flowsheets (Taken 11/26/2018 1419) Pt will Ambulate:  > 125 feet  with modified independence  with cane Goal: Pt Will Go Up/Down Stairs Outcome: Progressing Flowsheets (Taken 11/26/2018 1419) Pt will Go Up / Down Stairs:  6-9 stairs  with modified independence  with cane   2:20 PM, 11/26/18 Lonell Grandchild, MPT Physical Therapist with [facility] [phone] office [phone] mobile phone

## 2018-11-26 NOTE — Evaluation (Signed)
Physical Therapy Evaluation Patient Details Name: [name] MRN: [id number] DOB: 1934/06/18 Today's Date: 11/26/2018   History of Present Illness  [name] is a 84 y.o. female with a history of colon cancer diagnosed in 2011 status post polypectomy, hypertension, hypothyroidism, pulmonary embolism earlier this year on anticoagulation therapy.  Patient seen for a brief episode of unresponsiveness that lasted approximately 30 seconds earlier this morning.  Patient was conscious throughout the episode.  Patient was trying to lift herself out of a chair when her arms began to shake and she was unable to respond or move for approximately 30 seconds.  No focal weakness, numbness, slurred speech, vision changes.  She has had no prior episodes.  Of an MRI on 8/25 due to headache with weakness and vertigo.  The MRI showed 5 micro hemorrhages that is thought to be the result of chronic hypertensive angiopathy.  No palliating or provoking factors.  Patient feels fine now.    Clinical Impression  Patient functioning near baseline for functional mobility and gait, slightly weaker LLE requiring patient to use side rails in hallway when ambulating without AD, improvement in balance, armswing and endurance noted when using her  tripod cane, had minor difficulty on steps due to improper sequence using SPC when going upstairs and tolerated sitting up in chair after therapy.  Patient will benefit from continued physical therapy in hospital and recommended venue below to increase strength, balance, endurance for safe ADLs and gait.     Follow Up Recommendations Home health PT;Supervision - Intermittent    Equipment Recommendations  None recommended by PT    Recommendations for Other Services       Precautions / Restrictions Precautions Precautions: Fall Restrictions Weight Bearing Restrictions: No      Mobility  Bed Mobility Overal bed mobility: Modified Independent              General bed mobility comments: slightly increased time  Transfers Overall transfer level: Needs assistance Equipment used: None;Straight cane Transfers: Sit to/from Stand;Stand Pivot Transfers Sit to Stand: Modified independent (Device/Increase time);Supervision Stand pivot transfers: Modified independent (Device/Increase time);Supervision       General transfer comment: slow slightly labored movemnt  Ambulation/Gait Ambulation/Gait assistance: Supervision;Modified independent (Device/Increase time) Gait Distance (Feet): 200 Feet Assistive device: None;Straight cane Gait Pattern/deviations: Step-through pattern;Decreased step length - left;Decreased step length - right;Decreased stance time - left;Decreased stride length Gait velocity: decreased   General Gait Details: slightly labored cadence with decreased arm swing and occasional use of side rails when not using an AD, normal armswing when using tripod can with step-through pattern without having to lean side rails, no loss of balance  Stairs Stairs: Yes Stairs assistance: Supervision Stair Management: One rail Right;One rail Left;Step to pattern;With cane Number of Stairs: 9 General stair comments: had difficulty with sequence using SPC, tends to lead with cane when going upstairs, had to use 1 siderail, no loss of balance  Wheelchair Mobility    Modified Rankin (Stroke Patients Only)       Balance Overall balance assessment: Needs assistance Sitting-balance support: Feet supported;No upper extremity supported Sitting balance-Leahy Scale: Good     Standing balance support: During functional activity;No upper extremity supported Standing balance-Leahy Scale: Fair Standing balance comment: fair/good using SPC                             Pertinent Vitals/Pain Pain Assessment: No/denies pain    Home Living  Family/patient expects to be discharged to:: Private residence Living Arrangements:  Alone Available Help at Discharge: Family;Available PRN/intermittently Type of Home: House Home Access: Stairs to enter Entrance Stairs-Rails: None Entrance Stairs-Number of Steps: 1 Home Layout: One level Home Equipment: Walker - 4 wheels;Shower seat;Bedside commode;Cane - single point Additional Comments: has tripod cane    Prior Function Level of Independence: Independent with assistive device(s)         Comments: household ambulation without AD, uses tripod cane for short distanced community ambulation     Hand Dominance   Dominant Hand: Right    Extremity/Trunk Assessment   Upper Extremity Assessment Upper Extremity Assessment: Defer to OT evaluation    Lower Extremity Assessment Lower Extremity Assessment: Overall WFL for tasks assessed;RLE deficits/detail;LLE deficits/detail RLE Deficits / Details: grossly 5/5 LLE Deficits / Details: grossly 4/5 (near baseline per patient)    Cervical / Trunk Assessment Cervical / Trunk Assessment: Normal  Communication   Communication: No difficulties  Cognition Arousal/Alertness: Awake/alert Behavior During Therapy: WFL for tasks assessed/performed Overall Cognitive Status: Within Functional Limits for tasks assessed                                        General Comments      Exercises     Assessment/Plan    PT Assessment Patient needs continued PT services  PT Problem List Decreased strength;Decreased activity tolerance;Decreased balance;Decreased mobility       PT Treatment Interventions Balance training;Gait training;Stair training;Functional mobility training;Therapeutic activities;Therapeutic exercise;Patient/family education    PT Goals (Current goals can be found in the Care Plan section)  Acute Rehab PT Goals Patient Stated Goal: return home with family to assist PT Goal Formulation: With patient Time For Goal Achievement: 11/28/18 Potential to Achieve Goals: Good    Frequency Min  2X/week   Barriers to discharge        Co-evaluation               AM-PAC PT "6 Clicks" Mobility  Outcome Measure Help needed turning from your back to your side while in a flat bed without using bedrails?: None Help needed moving from lying on your back to sitting on the side of a flat bed without using bedrails?: None Help needed moving to and from a bed to a chair (including a wheelchair)?: None Help needed standing up from a chair using your arms (e.g., wheelchair or bedside chair)?: None Help needed to walk in hospital room?: A Little Help needed climbing 3-5 steps with a railing? : A Little 6 Click Score: 22    End of Session   Activity Tolerance: Patient tolerated treatment well;Patient limited by fatigue Patient left: in chair;with call bell/phone within reach Nurse Communication: Mobility status PT Visit Diagnosis: Unsteadiness on feet (R26.81);Other abnormalities of gait and mobility (R26.89);Muscle weakness (generalized) (M62.81)    Time: [id number] PT Time Calculation (min) (ACUTE ONLY): 34 min   Charges:   PT Evaluation $PT Eval Moderate Complexity: 1 Mod PT Treatments $Gait Training: 23-37 mins        2:17 PM, 11/26/18 Lonell Grandchild, MPT Physical Therapist with [facility] [phone] office [phone] mobile phone

## 2018-11-26 NOTE — Care Management Obs Status (Signed)
[facility] NOTIFICATION   Patient Details  Name: [name] MRN: [id number] Date of Birth: [birth year]   Medicare Observation Status Notification Given:  Yes    Tommy Medal 11/26/2018, 2:59 PM

## 2018-11-26 NOTE — Progress Notes (Signed)
OT Cancellation Note  Patient Details Name: [name] MRN: [id number] DOB: 1934-03-18   Cancelled Treatment:    Reason Eval/Treat Not Completed: Patient at procedure or test/ unavailable. Will re-attempt OT evaluation when able.    Ailene Ravel, OTR/L,CBIS  [phone]  11/26/2018, 9:09 AM

## 2018-11-26 NOTE — Progress Notes (Signed)
Patient Demographics:    [name], is a 84 y.o. female, DOB - May 13, 1934, [id number]  Admit date - 11/25/2018   Admitting Physician Truett Mainland, DO  Outpatient Primary MD for the patient is [facility], Modena Nunnery, MD  LOS - 0   Chief Complaint  Patient presents with  . Weakness        Subjective:    [name] today has no fevers, no emesis,  No chest pain, ambulating with physical therapist, daughter at bedside, questions answered  Assessment  & Plan :    Principal Problem:   TIA (transient ischemic attack) Active Problems:   Essential hypertension   Hypothyroidism   CKD (chronic kidney disease), stage III (HCC)   History of pulmonary embolism   Silent micro-hemorrhage of brain [facility])  Brief Summary- 84 y.o. female with a history of colon cancer diagnosed in 2011 status post polypectomy, hypertension, hypothyroidism, pulmonary embolism admitted on 11/25/2018 with intracranial hemorrhage while on Eliquis for PE, CT head showed- Punctate focus of hemorrhage in the deep white matter of the right frontal lobe  A/p 1)ICH--punctate focus of hemorrhage in the deep white matter of the right frontal lobe--- hold Eliquis for 2 weeks, avoid antiplatelet and NSAIDs -Discussed with Dr. Merlene Laughter the neurologist, outpatient follow-up in 2 to 3 of weeks advised  2)h/o PE--patient was diagnosed with PE on 04/26/2018, hold Eliquis as above in #1, venous Dopplers on 04/27/2018 were negative for acute DVT -Patient's pulmonary embolism was unprovoked and she has a history of colon cancer so ideally she should have lifelong anticoagulation ,however given ICH may have to come off anticoagulation   3)Possible TIA --Discussed with Dr. Merlene Laughter the neurologist, need to rule out possible seizure, EEG pending  4)CKD-- stable, creatinine 1.22 which is close to her baseline  5)HTN-- stable, c/n  Metoprolol 37.5 mg,  may use IV Hydralazine 10 mg  Every 4 hours Prn for systolic blood pressure over 160 mmhg   Disposition/Need for in-Hospital Stay- patient unable to be discharged at this time due to intracranial hemorrhage requiring further work-up including EEG*  Code Status : DNR  Family Communication:   (patient is alert, awake and coherent) Discussed with pt's daughter  Disposition Plan  : Home with [place]  Consults  : Curbside consultation with neurologist  DVT Prophylaxis  :   - SCDs ([place])  Lab Results  Component Value Date   PLT 278 11/25/2018    Inpatient Medications  Scheduled Meds: . atorvastatin  80 mg Oral Daily  . gabapentin  300 mg Oral BID  . metoprolol tartrate  37.5 mg Oral BID  . pantoprazole (PROTONIX) IV  40 mg Intravenous QHS  . senna-docusate  1 tablet Oral BID   Continuous Infusions: PRN Meds:.acetaminophen **OR** acetaminophen (TYLENOL) oral liquid 160 mg/5 mL **OR** acetaminophen, cycloSPORINE, labetalol, linaclotide    Anti-infectives (From admission, onward)   None        Objective:   Vitals:   11/26/18 1700 11/26/18 1749 11/26/18 1751 11/26/18 1752  BP: (!) 155/79 (!) 178/83 (!) 155/79 (!) 152/85  Pulse: 72 67 69 72  Resp: 19  19 19   Temp: 98.5 F (36.9 C)     TempSrc: Oral     SpO2: 97% 98%  97%  Weight:      Height:        Wt Readings from Last 3 Encounters:  11/25/18 71.2 kg  11/16/18 71.6 kg  11/13/18 71.7 kg     Intake/Output Summary (Last 24 hours) at 11/26/2018 1906 Last data filed at 11/26/2018 1700 Gross per 24 hour  Intake 840 ml  Output -  Net 840 ml     Physical Exam  Gen:- Awake Alert,  In no apparent distress  HEENT:- [place].AT, No sclera icterus Neck-Supple Neck,No JVD,.  Lungs-  CTAB , fair symmetrical air movement CV- S1, S2 normal, regular  Abd-  +ve B.Sounds, Abd Soft, No tenderness,    Extremity/Skin:- No  edema, pedal pulses present  Psych-affect is appropriate, oriented x3 Neuro-no new focal  deficits, somewhat unsteady gait, no tremors   Data Review:   Micro Results Recent Results (from the past 240 hour(s))  SARS CORONAVIRUS 2 (TAT 6-24 HRS) Nasopharyngeal Nasopharyngeal Swab     Status: None   Collection Time: 11/25/18  5:39 PM   Specimen: Nasopharyngeal Swab  Result Value Ref Range Status   SARS Coronavirus 2 NEGATIVE NEGATIVE Final    Comment: (NOTE) SARS-CoV-2 target nucleic acids are NOT DETECTED. The SARS-CoV-2 RNA is generally detectable in upper and lower respiratory specimens during the acute phase of infection. Negative results do not preclude SARS-CoV-2 infection, do not rule out co-infections with other pathogens, and should not be used as the sole basis for treatment or other patient management decisions. Negative results must be combined with clinical observations, patient history, and epidemiological information. The expected result is Negative. Fact Sheet for Patients: SugarRoll.be Fact Sheet for Healthcare Providers: https://www.woods-mathews.com/ This test is not yet approved or cleared by the [place] FDA and  has been authorized for detection and/or diagnosis of SARS-CoV-2 by FDA under an Emergency Use Authorization (EUA). This EUA will remain  in effect (meaning this test can be used) for the duration of the COVID-19 declaration under Section 56 4(b)(1) of the Act, 21 U.S.C. section 360bbb-3(b)(1), unless the authorization is terminated or revoked sooner. Performed at [facility], [place] [address] [place], [place] [address]     Radiology Reports Dg Chest 2 View  Result Date: 11/25/2018 CLINICAL DATA:  Patient with generalized weakness.  Dizziness. EXAM: CHEST - 2 VIEW COMPARISON:  Chest radiograph 08/08/2018 FINDINGS: Monitoring leads overlie the patient. Cardiac contours upper limits of normal. No large area pulmonary consolidation. No pleural effusion or pneumothorax. IMPRESSION: No acute  cardiopulmonary process. Electronically Signed   By: Lovey Newcomer M.D.   On: 11/25/2018 14:36   Ct Head Wo Contrast  Result Date: 11/26/2018 CLINICAL DATA:  Onset dizziness and weakness 11/25/2018. History of subarachnoid hemorrhage. EXAM: CT HEAD WITHOUT CONTRAST TECHNIQUE: Contiguous axial images were obtained from the base of the skull through the vertex without intravenous contrast. COMPARISON:  Head CT scan 11/25/2018 and 07/17/2017. FINDINGS: Brain: Punctate focus of increased attenuation in the deep white matter of the high right frontal lobe on image 23 of series 2 is less conspicuous than on the most recent comparison. No new abnormality including hemorrhage, infarct, mass lesion, mass effect, midline shift, hydrocephalus, pneumocephalus or abnormal extra-axial fluid collection is identified. Vascular: Atherosclerosis noted. Skull: Intact.  No focal lesion. Sinuses/Orbits: Negative. Other: None. IMPRESSION: Punctate focus of hemorrhage in the deep white matter of the right frontal lobe seen on the most recent CT scan is resolving. No new abnormality. Atrophy and chronic microvascular ischemic change.  Electronically Signed   By: Inge Rise M.D.   On: 11/26/2018 10:34   Ct Head Wo Contrast  Result Date: 11/25/2018 CLINICAL DATA:  Dizziness.  Generalized weakness. EXAM: CT HEAD WITHOUT CONTRAST TECHNIQUE: Contiguous axial images were obtained from the base of the skull through the vertex without intravenous contrast. COMPARISON:  Brain CT 07/17/2017 FINDINGS: Brain: Ventricles and sulci are prominent compatible with atrophy. Periventricular and subcortical white matter hypodensities compatible with chronic microvascular ischemic changes. No evidence for acute cortically based infarct, mass lesion or significant mass effect. There is a 4 mm focus of high attenuation within the anterior right frontal lobe white matter (image 18; series 2), most compatible with small intraparenchymal hemorrhage.  Vascular: Unremarkable Skull: Intact Sinuses/Orbits: Paranasal sinuses well aerated. Mastoid air cells are unremarkable. Orbits unremarkable. Other: None. IMPRESSION: Small (4 mm) focus of high attenuation within the anterior right frontal lobe white matter favored to represent focus of intraparenchymal blood products. Atrophy and chronic microvascular ischemic changes. Critical Value/emergent results were called by telephone at the time of interpretation on 11/25/2018 at 3:25 pm to providerZackowski, MD, who verbally acknowledged these results. Electronically Signed   By: Lovey Newcomer M.D.   On: 11/25/2018 15:27   Mr Jodene Nam Head Wo Contrast  Result Date: 11/26/2018 CLINICAL DATA:  Altered mental status for 2 days. EXAM: MRI HEAD WITHOUT CONTRAST MRA HEAD WITHOUT CONTRAST TECHNIQUE: Multiplanar, multiecho pulse sequences of the brain and surrounding structures were obtained without intravenous contrast. Angiographic images of the head were obtained using MRA technique without contrast. COMPARISON:  Head CT performed earlier the same day 11/26/2018, head CT 11/25/2018, brain MRI 11/06/2018, MRA head 11/22/2015 FINDINGS: MRI HEAD FINDINGS Brain: Multiple sequences are motion degraded. No restricted diffusion is demonstrated to suggest acute infarction. Redemonstrated advanced chronic small vessel ischemic disease. Multiple small chronic lacunar infarcts and prominent perivascular spaces within the bilateral basal ganglia and thalami. Small focus of SWI signal loss within the anterior right frontal lobe white matter corresponding with the probable small acute/subacute hemorrhage demonstrated on head CT 11/25/2018. There are multiple small chronic microhemorrhages within the bilateral thalami and left cerebellum suggestive of hypertensive microangiopathy. Moderate generalized parenchymal atrophy. No midline shift or extra-axial fluid collection. No evidence of intracranial mass. Vascular: Reported separately Skull and  upper cervical spine: Normal marrow signal. Sinuses/Orbits: Imaged orbits demonstrate no acute abnormality. Mild scattered paranasal sinus mucosal thickening. No significant mastoid effusion. MRA HEAD FINDINGS The right internal carotid artery siphon is patent. Apparent moderate focal stenosis of the distal cavernous/paraclinoid right internal carotid artery. The left internal carotid artery siphon is patent with no more than mild luminal narrowing. The right middle and anterior cerebral arteries are patent without high-grade proximal stenosis. The left middle and anterior cerebral arteries are patent without high-grade proximal stenosis. The intracranial vertebral arteries are patent bilaterally. Apparent mild/moderate segmental stenosis of the intracranial right vertebral artery. No significant stenosis within the left vertebral artery. The basilar artery is widely patent without significant stenosis. The bilateral posterior cerebral arteries are patent without high-grade proximal stenosis. No intracranial aneurysm is identified. IMPRESSION: MRI brain: Motion degraded exam. No evidence of acute infarct. Small focus of susceptibility weighted signal loss within the anterior right frontal lobe white matter corresponding with the probable acute/subacute hemorrhage described on head CT 11/25/2018. Moderate generalized parenchymal atrophy with advanced chronic small vessel ischemic disease. Multiple chronic microhemorrhages in a distribution suggestive of hypertensive microangiopathy. MRA head: Intracranial atherosclerotic disease as detailed, most notably as follows. Apparent moderate  focal stenosis of the distal cavernous/paraclinoid right internal carotid artery. Apparent mild/moderate segmental stenosis of the intracranial right vertebral artery. No intracranial aneurysm is identified. Electronically Signed   By: Kellie Simmering   On: 11/26/2018 10:33   Mr Brain Wo Contrast  Result Date: 11/26/2018 CLINICAL DATA:   Altered mental status for 2 days. EXAM: MRI HEAD WITHOUT CONTRAST MRA HEAD WITHOUT CONTRAST TECHNIQUE: Multiplanar, multiecho pulse sequences of the brain and surrounding structures were obtained without intravenous contrast. Angiographic images of the head were obtained using MRA technique without contrast. COMPARISON:  Head CT performed earlier the same day 11/26/2018, head CT 11/25/2018, brain MRI 11/06/2018, MRA head 11/22/2015 FINDINGS: MRI HEAD FINDINGS Brain: Multiple sequences are motion degraded. No restricted diffusion is demonstrated to suggest acute infarction. Redemonstrated advanced chronic small vessel ischemic disease. Multiple small chronic lacunar infarcts and prominent perivascular spaces within the bilateral basal ganglia and thalami. Small focus of SWI signal loss within the anterior right frontal lobe white matter corresponding with the probable small acute/subacute hemorrhage demonstrated on head CT 11/25/2018. There are multiple small chronic microhemorrhages within the bilateral thalami and left cerebellum suggestive of hypertensive microangiopathy. Moderate generalized parenchymal atrophy. No midline shift or extra-axial fluid collection. No evidence of intracranial mass. Vascular: Reported separately Skull and upper cervical spine: Normal marrow signal. Sinuses/Orbits: Imaged orbits demonstrate no acute abnormality. Mild scattered paranasal sinus mucosal thickening. No significant mastoid effusion. MRA HEAD FINDINGS The right internal carotid artery siphon is patent. Apparent moderate focal stenosis of the distal cavernous/paraclinoid right internal carotid artery. The left internal carotid artery siphon is patent with no more than mild luminal narrowing. The right middle and anterior cerebral arteries are patent without high-grade proximal stenosis. The left middle and anterior cerebral arteries are patent without high-grade proximal stenosis. The intracranial vertebral arteries are  patent bilaterally. Apparent mild/moderate segmental stenosis of the intracranial right vertebral artery. No significant stenosis within the left vertebral artery. The basilar artery is widely patent without significant stenosis. The bilateral posterior cerebral arteries are patent without high-grade proximal stenosis. No intracranial aneurysm is identified. IMPRESSION: MRI brain: Motion degraded exam. No evidence of acute infarct. Small focus of susceptibility weighted signal loss within the anterior right frontal lobe white matter corresponding with the probable acute/subacute hemorrhage described on head CT 11/25/2018. Moderate generalized parenchymal atrophy with advanced chronic small vessel ischemic disease. Multiple chronic microhemorrhages in a distribution suggestive of hypertensive microangiopathy. MRA head: Intracranial atherosclerotic disease as detailed, most notably as follows. Apparent moderate focal stenosis of the distal cavernous/paraclinoid right internal carotid artery. Apparent mild/moderate segmental stenosis of the intracranial right vertebral artery. No intracranial aneurysm is identified. Electronically Signed   By: Kellie Simmering   On: 11/26/2018 10:33   Mr Brain W Wo Contrast  Result Date: 11/07/2018 CLINICAL DATA:  Headache with weakness and vertigo EXAM: MRI HEAD WITHOUT AND WITH CONTRAST TECHNIQUE: Multiplanar, multiecho pulse sequences of the brain and surrounding structures were obtained without and with intravenous contrast. CONTRAST:  7 mL Gadavist COMPARISON:  Brain MRI 11/22/2015 FINDINGS: BRAIN: There is no acute infarct, acute hemorrhage or extra-axial collection. Diffuse confluent hyperintense T2-weighted signal within the periventricular, deep and juxtacortical white matter, most commonly due to chronic ischemic microangiopathy. There is generalized atrophy without lobar predilection. The midline structures are normal. No abnormal contrast enhancement. INTERNAL AUDITORY  CANALS: There is no cerebellopontine angle mass. The cochleae and semicircular canals are normal. No focal abnormality along the course of the 7th and 8th cranial nerves. Normal  porus acusticus and vestibular aqueduct bilaterally. VASCULAR: Approximately 5 foci of chronic microhemorrhage in a predominantly central distribution. The major intracranial arterial and venous sinus flow voids are normal. SKULL AND UPPER CERVICAL SPINE: Calvarial bone marrow signal is normal. There is no skull base mass. The visualized upper cervical spine and soft tissues are normal. SINUSES/ORBITS: There are no fluid levels or advanced mucosal thickening. The mastoid air cells and middle ear cavities are free of fluid. The orbits are normal. IMPRESSION: 1. No acute intracranial abnormality. 2. Normal appearance of the internal auditory canals. 3. Severe chronic ischemic microangiopathy, unchanged. 4. Multiple central chronic microhemorrhages, most consistent with chronic hypertensive angiopathy. Electronically Signed   By: Ulyses Jarred M.D.   On: 11/07/2018 00:49     CBC Recent Labs  Lab 11/25/18 1351  WBC 4.4  HGB 13.5  HCT 43.3  PLT 278  MCV 100.9*  MCH 31.5  MCHC 31.2  RDW 15.1  LYMPHSABS 1.5  MONOABS 0.5  EOSABS 0.1  BASOSABS 0.0    Chemistries  Recent Labs  Lab 11/25/18 1351  NA 143  K 3.8  CL 107  CO2 28  GLUCOSE 98  BUN 15  CREATININE 1.22*  CALCIUM 9.5  AST 21  ALT 19  ALKPHOS 84  BILITOT 1.1   ------------------------------------------------------------------------------------------------------------------ No results for input(s): CHOL, HDL, LDLCALC, TRIG, CHOLHDL, LDLDIRECT in the last 72 hours.  Lab Results  Component Value Date   HGBA1C 5.7 (H) 11/22/2015   ------------------------------------------------------------------------------------------------------------------ No results for input(s): TSH, T4TOTAL, T3FREE, THYROIDAB in the last 72 hours.  Invalid input(s):  FREET3 ------------------------------------------------------------------------------------------------------------------ No results for input(s): VITAMINB12, FOLATE, FERRITIN, TIBC, IRON, RETICCTPCT in the last 72 hours.  Coagulation profile No results for input(s): INR, PROTIME in the last 168 hours.  No results for input(s): DDIMER in the last 72 hours.  Cardiac Enzymes No results for input(s): CKMB, TROPONINI, MYOGLOBIN in the last 168 hours.  Invalid input(s): CK ------------------------------------------------------------------------------------------------------------------ No results found for: BNP   Roxan Hockey M.D on 11/26/2018 at 7:06 PM  Go to [email or web address] - for contact info  [facility] - Office  [phone]

## 2018-11-26 NOTE — Evaluation (Signed)
Speech Language Pathology Evaluation Patient Details Name: [name] MRN: [id number] DOB: 22-Jan-1935 Today's Date: 11/26/2018 Time: 1231-1300 SLP Time Calculation (min) (ACUTE ONLY): 29 min  Problem List:  Patient Active Problem List   Diagnosis Date Noted  . TIA (transient ischemic attack) 11/25/2018  . Silent micro-hemorrhage of brain ([place]) 11/25/2018  . PVC (premature ventricular contraction) 11/16/2018  . DDD (degenerative disc disease), cervical 08/16/2018  . Hyperlipemia 08/16/2018  . History of pulmonary embolism 04/26/2018  . Insomnia 12/15/2015  . Paresthesia of left upper and lower extremity 11/21/2015  . Left leg weakness 11/21/2015  . Paresthesias   . Essential hypertension 11/02/2015  . Hypothyroidism 11/02/2015  . Vertigo 11/02/2015  . CKD (chronic kidney disease), stage III ([place]) 11/02/2015  . Loss of weight 11/02/2015  . OA (osteoarthritis) of knee 11/02/2015  . Colon cancer ([place]) 02/28/2011  . History of colon cancer 06/29/2010  . Constipation 06/10/2009   Past Medical History:  Past Medical History:  Diagnosis Date  . Allergy   . Arthritis   . Colon cancer ([place])    colon ca dx 07/30/09  . History of cardiac monitoring 07/2017   "Event monitor demonstrated sinus rhythm with isolated PACs and no arrhythmias"  . History of colon cancer 06/2009   found at time of TCS 06/29/09, 1.2cm sessile cecal polyp, no adjuvent therapy needed  . HTN (hypertension)   . Hx of cardiovascular stress test 07/2017   "No diagnostic ST segment changes to indicate ischemia. Small, moderate intensity, reversible apical to basal inferolateral defect consistent with ischemia. This is a low risk study. Nuclear stress EF: 84%."  . Hyperlipidemia   . Hypothyroidism   . PE (pulmonary thromboembolism) ([place])   . Renal disorder    cyst on kidney   . Stroke ([place])   . Vertigo    Past Surgical History:  Past Surgical History:  Procedure Laterality Date  . ABDOMINAL  HYSTERECTOMY    . COLON SURGERY  07/2009   right hemicolectomy, no residual colon cancer on path  . COLONOSCOPY  07/16/2010   [id number] POLYP-TCS 3 YEARS  . COLONOSCOPY N/A 08/02/2013   Procedure: COLONOSCOPY;  Surgeon: Danie Binder, MD;  Location: [facility];  Service: Endoscopy;  Laterality: N/A;  9:30  . PARTIAL HYSTERECTOMY    . PARTIAL THYMECTOMY    . partial thyroidectomy     benign tumors   HPI:  84yo W w/PMH of colon cancer, PE on Eliquis, CVA, HLD, HTN, hypothyroidism, cerebral microhemorrhages p/w a brief episode of AMS and generalized weakness on 11/25/18. She was dizzy this morning and her BP was elevated. Daughter reports patient used her arms to lift herself up but her arms began to shake and she felt like she could not move and was lightheaded. The daughter states that she was not responding to her for about 30 seconds. Pt was conscious through the episode. She denies any focal weakness, numbness, slurred speech, vision change. She currently denies complaints, no prior episodes. NIHSS 0, exam normal. CTH shows small 35mm acute microhemorrhage in the right frontal lobe. 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 The episode is most likely a TIA, possibly related to orthostatic change in blood pressure. HTN encephalopathy is another possibillity. SLE requested as part of stroke protocol.   Assessment / Plan / Recommendation Clinical Impression  SLE completed in room. Pt presents with WNL cognitive  linguistic skills at this time. Pt with mild working memory deficits, but benefited from use of category cue (she recalled 2 of 4 words independently and recalled the remaining two words when provided cues). Pt lives alone, but her daughter lives nearby and provides support for grocery shopping/errands as Pt has not driven for a few months. Pt denies  difficulty remembering to take her medications at home. No family present at this time to confirm baseline status. No further SLP services indicated at this time. Pt in agreement with plan of care.     SLP Assessment  SLP Recommendation/Assessment: Patient does not need any further Speech Lanaguage Pathology Services SLP Visit Diagnosis: Cognitive communication deficit (R41.841)    Follow Up Recommendations  None    Frequency and Duration           SLP Evaluation Cognition  Overall Cognitive Status: Within Functional Limits for tasks assessed Arousal/Alertness: Awake/alert Orientation Level: Oriented X4 Memory: Appears intact(Pt recalled 2/4 independently and 4/4 with category cue) Awareness: Appears intact Problem Solving: Appears intact Safety/Judgment: Appears intact       Comprehension  Auditory Comprehension Overall Auditory Comprehension: Appears within functional limits for tasks assessed Yes/No Questions: Within Functional Limits Commands: Within Functional Limits Conversation: Complex Visual Recognition/Discrimination Discrimination: Not tested Reading Comprehension Reading Status: Not tested    Expression Expression Primary Mode of Expression: Verbal Verbal Expression Overall Verbal Expression: Appears within functional limits for tasks assessed Initiation: No impairment Automatic Speech: Name;Social Response Level of Generative/Spontaneous Verbalization: Conversation Repetition: No impairment Naming: No impairment Pragmatics: No impairment Non-Verbal Means of Communication: Not applicable Written Expression Dominant Hand: Right Written Expression: Not tested   Oral / Motor  Oral Motor/Sensory Function Overall Oral Motor/Sensory Function: Within functional limits Motor Speech Overall Motor Speech: Appears within functional limits for tasks assessed Respiration: Within functional limits Phonation: Normal Resonance: Within functional  limits Articulation: Within functional limitis Intelligibility: Intelligible Motor Planning: Witnin functional limits Motor Speech Errors: Not applicable   Thank you,  Genene Churn, [place]                     [place] 11/26/2018, 2:26 PM

## 2018-11-26 NOTE — TOC Initial Note (Addendum)
Transition of Care [facility]) - Initial/Assessment Note    Patient Details  Name: [name] MRN: [id number] Date of Birth: 13-May-1934  Transition of Care [facility]) CM/SW Contact:    Boneta Lucks, RN Phone Number: 11/26/2018, 4:05 PM  Clinical Narrative:       Patient admitted with TIA, Lives alone, with her daughter support.  PT is recommending Home Health PT.  Romualdo Bolk with [facility] accepted the referral. TOC to follow for needs.             AddendumJudson Roch with [facility] Patient has orders in for HHPT from her PCP.  Spoke with patient she will go with Nan[facility] Means since that was already set up.  Updated [place] with [facility].  Will need resumption orders due to admission. TOC to follow.    Expected Discharge Plan: [place] Barriers to Discharge: Continued Medical Work up   Patient Goals and CMS Choice Patient states their goals for this hospitalization and ongoing recovery are:: to go home. CMS Medicare.gov Compare Post Acute Care list provided to:: Patient Choice offered to / list presented to : Patient  Expected Discharge Plan and Services Expected Discharge Plan: [place]: PT [facility] Agency: [place] ([place]) Date [facility] Agency Contacted: 11/26/18 Time [place]: 1604 Representative spoke with at [place]: [place] Arrangements/Services   Lives with:: Self   Do you feel safe going back to the place where you live?: Yes      Need for Family Participation in Patient Care: Yes (Comment) Care giver support system in place?: Yes (comment)   Criminal Activity/Legal Involvement Pertinent to Current Situation/Hospitalization: No - Comment as needed  Activities of Daily Living Home Assistive Devices/Equipment: Eyeglasses, Cane (specify quad or straight), Walker (specify type), Dentures (specify type) ADL Screening (condition at time of  admission) Patient's cognitive ability adequate to safely complete daily activities?: Yes Is the patient deaf or have difficulty hearing?: No Does the patient have difficulty seeing, even when wearing glasses/contacts?: No Does the patient have difficulty concentrating, remembering, or making decisions?: No Patient able to express need for assistance with ADLs?: Yes Does the patient have difficulty dressing or bathing?: No Independently performs ADLs?: Yes (appropriate for developmental age) Does the patient have difficulty walking or climbing stairs?: No Weakness of Legs: None Weakness of Arms/Hands: None  Alcohol / Substance Use: Not Applicable Psych Involvement: No (comment)  Admission diagnosis:  TIA (transient ischemic attack) [G45.9] Patient Active Problem List   Diagnosis Date Noted  . TIA (transient ischemic attack) 11/25/2018  . Silent micro-hemorrhage of brain ([place]) 11/25/2018  . PVC (premature ventricular contraction) 11/16/2018  . DDD (degenerative disc disease), cervical 08/16/2018  . Hyperlipemia 08/16/2018  . History of pulmonary embolism 04/26/2018  . Insomnia 12/15/2015  . Paresthesia of left upper and lower extremity 11/21/2015  . Left leg weakness 11/21/2015  . Paresthesias   . Essential hypertension 11/02/2015  . Hypothyroidism 11/02/2015  . Vertigo 11/02/2015  . CKD (chronic kidney disease), stage III ([place]) 11/02/2015  . Loss of weight 11/02/2015  . OA (osteoarthritis) of knee 11/02/2015  . Colon cancer ([place]) 02/28/2011  . History of colon cancer 06/29/2010  . Constipation 06/10/2009   PCP:  Alycia Rossetti, MD Pharmacy:   [facility], [place] [address] Phone:  [phone] Fax: [phone]         Readmission Risk Interventions No flowsheet data found.

## 2018-11-26 NOTE — Care Management Obs Status (Signed)
[place] NOTIFICATION   Patient Details  Name: [name] MRN: [id number] Date of Birth: May 20, 1934   Medicare Observation Status Notification Given:  Yes    Tommy Medal 11/26/2018, 3:17 PM

## 2018-11-27 ENCOUNTER — Observation Stay (HOSPITAL_COMMUNITY)
Admit: 2018-11-27 | Discharge: 2018-11-27 | Disposition: A | Discharge status: Home / Self Care | Payer: Medicare Other | Attending: Neurology | Admitting: Neurology

## 2018-11-27 DIAGNOSIS — R569 Unspecified convulsions: Secondary | ICD-10-CM | POA: Diagnosis not present

## 2018-11-27 DIAGNOSIS — I618 Other nontraumatic intracerebral hemorrhage: Secondary | ICD-10-CM | POA: Diagnosis not present

## 2018-11-27 LAB — BASIC METABOLIC PANEL
Anion gap: 10 (ref 5–15)
BUN: 25 mg/dL — ABNORMAL HIGH (ref 8–23)
CO2: 25 mmol/L (ref 22–32)
Calcium: 9.4 mg/dL (ref 8.9–10.3)
Chloride: 106 mmol/L (ref 98–111)
Creatinine, Ser: 1.71 mg/dL — ABNORMAL HIGH (ref 0.44–1.00)
GFR calc Af Amer: 32 mL/min — ABNORMAL LOW (ref >60)
GFR calc non Af Amer: 27 mL/min — ABNORMAL LOW (ref >60)
Glucose, Bld: 135 mg/dL — ABNORMAL HIGH (ref 70–99)
Potassium: 3.2 mmol/L — ABNORMAL LOW (ref 3.5–5.1)
Sodium: 141 mmol/L (ref 135–145)

## 2018-11-27 NOTE — Discharge Instructions (Signed)
1)Stop Apixaban/Eliquis due to concerns about bleeding 2)Talk to your primary care physician in a couple of weeks to see if you can go back on your blood thinner (Eliquis/apixaban)--please have your primary care physician request your full discharge summary from today so that we have enough information to help you make that decision 3)Avoid ibuprofen/Advil/Aleve/Motrin/Goody Powders/Naproxen/BC powders/Meloxicam/Diclofenac/Indomethacin and other Nonsteroidal anti-inflammatory medications as these will make you more likely to bleed and can cause stomach ulcers, can also cause Kidney problems. 4)Follow-up with the primary care physician in 10 to 14 days for recheck and reevaluation and to decide if he should go back on Eliquis/apixaban again 5)Follow up with neurologist Dr. Merlene Laughter in about 3 weeks for recheck and reevaluation-- please call Dr Freddie Apley Neurology office at Phone: [phone]--to make an appointment for 3 weeks from now--- his address is --Address: [address], [place], [place] 6)Repeat  BMP(kidney and electrolyte blood test ) in about a week with your primary care physician--creatinine was trending up

## 2018-11-27 NOTE — Evaluation (Signed)
Occupational Therapy Evaluation Patient Details Name: [name] MRN: [id number] DOB: 02/21/1935 Today's Date: 11/27/2018    History of Present Illness [name] is a 84 y.o. female with a history of colon cancer diagnosed in 2011 status post polypectomy, hypertension, hypothyroidism, pulmonary embolism earlier this year on anticoagulation therapy.  Patient seen for a brief episode of unresponsiveness that lasted approximately 30 seconds earlier this morning.  Patient was conscious throughout the episode.  Patient was trying to lift herself out of a chair when her arms began to shake and she was unable to respond or move for approximately 30 seconds.  No focal weakness, numbness, slurred speech, vision changes.  She has had no prior episodes.  Of an MRI on 8/25 due to headache with weakness and vertigo.  The MRI showed 5 micro hemorrhages that is thought to be the result of chronic hypertensive angiopathy.  No palliating or provoking factors.  Patient feels fine now.   Clinical Impression   Pt received supine in bed, agreeable to OT evaluation. Pt performing ADLs with supervision for standing tasks. Pt not using any DME this am, moving carefully with no LOB. Pt appears to be at baseline with ADL completion, no further OT services required at this time.     Follow Up Recommendations  No OT follow up;Supervision - Intermittent    Equipment Recommendations  None recommended by OT       Precautions / Restrictions Precautions Precautions: Fall Restrictions Weight Bearing Restrictions: No      Mobility Bed Mobility Overal bed mobility: Modified Independent             General bed mobility comments: slightly increased time  Transfers Overall transfer level: Needs assistance Equipment used: None Transfers: Sit to/from [employer] Transfers Sit to Stand: Modified independent (Device/Increase time);Supervision Stand pivot transfers: Modified independent  (Device/Increase time);Supervision                ADL either performed or assessed with clinical judgement   ADL Overall ADL's : Needs assistance/impaired Eating/Feeding: Modified independent;Sitting   Grooming: Wash/dry hands;Wash/dry face;Supervision/safety;Standing Grooming Details (indicate cue type and reason): performed standing at sink                 Toilet Transfer: Supervision/safety;Ambulation Toilet Transfer Details (indicate cue type and reason): simulated with transfer to chair         Functional mobility during ADLs: Supervision/safety       Vision Baseline Vision/History: Wears glasses Wears Glasses: At all times Patient Visual Report: No change from baseline Vision Assessment?: No apparent visual deficits            Pertinent Vitals/Pain Pain Assessment: No/denies pain     Hand Dominance Right   Extremity/Trunk Assessment Upper Extremity Assessment Upper Extremity Assessment: Overall WFL for tasks assessed(BUE grossly 4/5)   Lower Extremity Assessment Lower Extremity Assessment: Defer to PT evaluation   Cervical / Trunk Assessment Cervical / Trunk Assessment: Normal   Communication Communication Communication: No difficulties   Cognition Arousal/Alertness: Awake/alert Behavior During Therapy: WFL for tasks assessed/performed Overall Cognitive Status: Within Functional Limits for tasks assessed                                                Home Living Family/patient expects to be discharged to:: Private residence Living Arrangements: Alone Available Help at  Discharge: Family;Available PRN/intermittently Type of Home: House Home Access: Stairs to enter [employer] of Steps: 1 Entrance Stairs-Rails: None Home Layout: One level     Bathroom Shower/Tub: Teacher, early years/pre: Standard     Home Equipment: Environmental consultant - 4 wheels;Shower seat;Bedside commode;Cane - single point    Additional Comments: has tripod cane  Lives With: Alone    Prior Functioning/Environment Level of Independence: Independent with assistive device(s)        Comments: household ambulation without AD, uses tripod cane for short distanced community ambulation; independent in ADLs        OT Problem List: Decreased activity tolerance       AM-PAC OT "6 Clicks" Daily Activity     Outcome Measure Help from another person eating meals?: None Help from another person taking care of personal grooming?: A Little Help from another person toileting, which includes using toliet, bedpan, or urinal?: A Little Help from another person bathing (including washing, rinsing, drying)?: A Little Help from another person to put on and taking off regular upper body clothing?: None Help from another person to put on and taking off regular lower body clothing?: A Little 6 Click Score: 20   End of Session    Activity Tolerance: Patient tolerated treatment well Patient left: in chair;with call bell/phone within reach  OT Visit Diagnosis: Muscle weakness (generalized) (M62.81)                Time: [id number] OT Time Calculation (min): 16 min Charges:  OT General Charges $OT Visit: 1 Visit OT Evaluation $OT Eval Low Complexity: 1 Low   Guadelupe Sabin, OTR/L  [phone] 11/27/2018, 7:56 AM

## 2018-11-27 NOTE — Discharge Summary (Signed)
[name], is a 84 y.o. female  DOB 06-02-1934  MRN [id number].  Admission date:  11/25/2018  Admitting Physician  Truett Mainland, DO  Discharge Date:  11/27/2018   Primary MD  Alycia Rossetti, MD  Recommendations for primary care physician for things to follow:   1)Stop Apixaban/Eliquis due to concerns about bleeding 2)Talk to your primary care physician in a couple of weeks to see if you can go back on your blood thinner (Eliquis/apixaban)--please have your primary care physician request your full discharge summary from today so that we have enough information to help you make that decision 3)Avoid ibuprofen/Advil/Aleve/Motrin/Goody Powders/Naproxen/BC powders/Meloxicam/Diclofenac/Indomethacin and other Nonsteroidal anti-inflammatory medications as these will make you more likely to bleed and can cause stomach ulcers, can also cause Kidney problems. 4)Follow-up with the primary care physician in 10 to 14 days for recheck and reevaluation and to decide if he should go back on Eliquis/apixaban again 5)Follow up with neurologist Dr. Merlene Laughter in about 3 weeks for recheck and reevaluation-- please call Dr Freddie Apley Neurology office at Phone: [phone]--to make an appointment for 3 weeks from now--- his address is --Address: [address], [place], [place] 6)Repeat  BMP(kidney and electrolyte blood test ) in about a week with your primary care physician--creatinine was trending up  Admission Diagnosis  TIA (transient ischemic attack) [G45.9]   Discharge Diagnosis  TIA (transient ischemic attack) [G45.9]    Principal Problem:   Silent micro-hemorrhage of brain ([place]) Active Problems:   TIA (transient ischemic attack)   Essential hypertension   Hypothyroidism   CKD (chronic kidney disease), stage III ([place])   History of pulmonary embolism      Past Medical History:  Diagnosis  Date  . Allergy   . Arthritis   . Colon cancer ([place])    colon ca dx 07/30/09  . History of cardiac monitoring 07/2017   "Event monitor demonstrated sinus rhythm with isolated PACs and no arrhythmias"  . History of colon cancer 06/2009   found at time of TCS 06/29/09, 1.2cm sessile cecal polyp, no adjuvent therapy needed  . HTN (hypertension)   . Hx of cardiovascular stress test 07/2017   "No diagnostic ST segment changes to indicate ischemia. Small, moderate intensity, reversible apical to basal inferolateral defect consistent with ischemia. This is a low risk study. Nuclear stress EF: 84%."  . Hyperlipidemia   . Hypothyroidism   . PE (pulmonary thromboembolism) ([place])   . Renal disorder    cyst on kidney   . Stroke ([place])   . Vertigo     Past Surgical History:  Procedure Laterality Date  . ABDOMINAL HYSTERECTOMY    . COLON SURGERY  07/2009   right hemicolectomy, no residual colon cancer on path  . COLONOSCOPY  07/16/2010   [id number] POLYP-TCS 3 YEARS  . COLONOSCOPY N/A 08/02/2013   Procedure: COLONOSCOPY;  Surgeon: Danie Binder, MD;  Location: [facility];  Service: Endoscopy;  Laterality: N/A;  9:30  . PARTIAL HYSTERECTOMY    .  PARTIAL THYMECTOMY    . partial thyroidectomy     benign tumors     HPI  from the history and physical done on the day of admission:    -Patient Coming From: home  Chief Complaint: Brief episode of unresponsiveness  HPI: [name] is a 84 y.o. female with a history of colon cancer diagnosed in 2011 status post polypectomy, hypertension, hypothyroidism, pulmonary embolism earlier this year on anticoagulation therapy.  Patient seen for a brief episode of unresponsiveness that lasted approximately 30 seconds earlier this morning.  Patient was conscious throughout the episode.  Patient was trying to lift herself out of a chair when her arms began to shake and she was unable to respond or move for approximately 30 seconds.  No focal  weakness, numbness, slurred speech, vision changes.  She has had no prior episodes.  Of an MRI on 8/25 due to headache with weakness and vertigo.  The MRI showed 5 micro hemorrhages that is thought to be the result of chronic hypertensive angiopathy.  No palliating or provoking factors.  Patient feels fine now.  Emergency Department Course: CT of the head shows a 4 mm microhemorrhage in.    Hospital Course:     Brief Summary- 84 y.o.femalewith a history of colon cancer diagnosed in 2011 status post polypectomy, hypertension, hypothyroidism, pulmonary embolism admitted on 11/25/2018 with intracranial hemorrhage while on Eliquis for PE, CT head showed- Punctate focus of hemorrhage in the deep white matter of the right  frontal lobe  A/p 1)ICH--punctate focus of hemorrhage in the deep white matter of the right frontal lobe--- hold Eliquis for 2 weeks, avoid antiplatelet and NSAIDs -Discussed with Dr. Merlene Laughter the neurologist, outpatient neurology follow-up in 2 to 3 of weeks advised -Serial CT head, MRI and MRA brain with punctate focus of hemorrhage in the deep white matter of the right frontal lobe that appears to be resolving  2)h/o PE--patient was diagnosed with PE on 04/26/2018, hold Eliquis as above in #1, venous Dopplers on 04/27/2018 were negative for acute DVT -Patient's pulmonary embolism was unprovoked and she has a history of colon cancer so ideally she should have lifelong anticoagulation ,however given ICH may have to come off anticoagulation.  Hold Eliquis for at least a couple weeks and discuss risk Versus benefits of Eliquis use with PCP in a couple weeks  3)Possible TIA --Discussed with Dr. Merlene Laughter the neurologist,  , EEG without acute findings, carotid artery Dopplers from June 2020 without hemodynamically significant stenosis  4)CKD-- stable, creatinine is up to 1.7 from a baseline of 1.4-1.5, repeat BMP in about a week advised   5)HTN-- stable, c/n Metoprolol 37.5 mg,     Code Status : DNR  Family Communication:   (patient is alert, awake and coherent) Discussed with pt's daughter  Disposition Plan  : Home with [facility]  Consults  : Curbside consultation with neurologist  DVT Prophylaxis  :   - SCDs ([place])  Discharge Condition: stable  Follow UP--neurologist and PCP as advised  Follow-up Information    [facility] Follow up.   Specialty: Home Health Services Why: Home Health PT Contact information: [place] [id number] [phone]          Diet and Activity recommendation:  As advised  Discharge Instructions     Discharge Instructions    Call MD for:  difficulty breathing, headache or visual disturbances   Complete by: As directed    Call MD  for:  persistant dizziness or light-headedness   Complete by: As directed    Call MD for:  persistant nausea and vomiting   Complete by: As directed    Call MD for:  severe uncontrolled pain   Complete by: As directed    Call MD for:  temperature >100.4   Complete by: As directed    Diet - low sodium heart healthy   Complete by: As directed    Discharge instructions   Complete by: As directed    1)Stop Apixaban/Eliquis due to concerns about bleeding 2)Talk to your primary care physician in a couple of weeks to see if you can go back on your blood thinner (Eliquis/apixaban)--please have your primary care physician request your full discharge summary from today so that we have enough information to help you make that decision 3)Avoid ibuprofen/Advil/Aleve/Motrin/Goody Powders/Naproxen/BC powders/Meloxicam/Diclofenac/Indomethacin and other Nonsteroidal anti-inflammatory medications as these will make you more likely to bleed and can cause stomach ulcers, can also cause Kidney problems. 4)Follow-up with the primary care physician in 10 to 14 days for recheck and reevaluation and to decide if he should go back on Eliquis/apixaban again 5)Follow up  with neurologist Dr. Merlene Laughter in about 3 weeks for recheck and reevaluation-- please call Dr Freddie Apley Neurology office at Phone: [phone]--to make an appointment for 3 weeks from now--- his address is --Address: [address], [place], [place] 6)Repeat  BMP(kidney and electrolyte blood test ) in about a week with your primary care physician--creatinine was trending up   Increase activity slowly   Complete by: As directed         Discharge Medications     Allergies as of 11/27/2018   No Known Allergies     Medication List    STOP taking these medications   apixaban 2.5 MG Tabs tablet Commonly known as: Eliquis     TAKE these medications   acetaminophen 500 MG tablet Commonly known as: TYLENOL Take 1,000 mg by mouth every 8 (eight) hours as needed (cold).   atorvastatin 40 MG tablet Commonly known as: LIPITOR Take 2 tablets (80 mg total) by mouth daily.   cholecalciferol 25 MCG (1000 UT) tablet Commonly known as: VITAMIN D3 Take 1,000 Units by mouth daily.   gabapentin 300 MG capsule Commonly known as: NEURONTIN Take 1 in the morning and 1 in the evening What changed:   how much to take  how to take this  when to take this  additional instructions   linaclotide 72 MCG capsule Commonly known as: Linzess TAKE 1 CAPSULE BY MOUTH DAILY BEFORE BREAKFAST. What changed:   how much to take  how to take this  when to take this  reasons to take this  additional instructions   metoprolol tartrate 25 MG tablet Commonly known as: LOPRESSOR Take 1.5 tablets (37.5 mg total) by mouth 2 (two) times daily.   Restasis 0.05 % ophthalmic emulsion Generic drug: cycloSPORINE Place 1 drop into both eyes 2 (two) times daily as needed (chronic dry eye).       Major procedures and Radiology Reports - PLEASE review detailed and final reports for all details, in brief -   Dg Chest 2 View  Result Date: 11/25/2018 CLINICAL DATA:  Patient with generalized  weakness.  Dizziness. EXAM: CHEST - 2 VIEW COMPARISON:  Chest radiograph 08/08/2018 FINDINGS: Monitoring leads overlie the patient. Cardiac contours upper limits of normal. No large area pulmonary consolidation. No pleural effusion or pneumothorax. IMPRESSION: No acute cardiopulmonary process.  Electronically Signed   By: Lovey Newcomer M.D.   On: 11/25/2018 14:36   Ct Head Wo Contrast  Result Date: 11/26/2018 CLINICAL DATA:  Onset dizziness and weakness 11/25/2018. History of subarachnoid hemorrhage. EXAM: CT HEAD WITHOUT CONTRAST TECHNIQUE: Contiguous axial images were obtained from the base of the skull through the vertex without intravenous contrast. COMPARISON:  Head CT scan 11/25/2018 and 07/17/2017. FINDINGS: Brain: Punctate focus of increased attenuation in the deep white matter of the high right frontal lobe on image 23 of series 2 is less conspicuous than on the most recent comparison. No new abnormality including hemorrhage, infarct, mass lesion, mass effect, midline shift, hydrocephalus, pneumocephalus or abnormal extra-axial fluid collection is identified. Vascular: Atherosclerosis noted. Skull: Intact.  No focal lesion. Sinuses/Orbits: Negative. Other: None. IMPRESSION: Punctate focus of hemorrhage in the deep white matter of the right frontal lobe seen on the most recent CT scan is resolving. No new abnormality. Atrophy and chronic microvascular ischemic change. Electronically Signed   By: Inge Rise M.D.   On: 11/26/2018 10:34   Ct Head Wo Contrast  Result Date: 11/25/2018 CLINICAL DATA:  Dizziness.  Generalized weakness. EXAM: CT HEAD WITHOUT CONTRAST TECHNIQUE: Contiguous axial images were obtained from the base of the skull through the vertex without intravenous contrast. COMPARISON:  Brain CT 07/17/2017 FINDINGS: Brain: Ventricles and sulci are prominent compatible with atrophy. Periventricular and subcortical white matter hypodensities compatible with chronic microvascular ischemic  changes. No evidence for acute cortically based infarct, mass lesion or significant mass effect. There is a 4 mm focus of high attenuation within the anterior right frontal lobe white matter (image 18; series 2), most compatible with small intraparenchymal hemorrhage. Vascular: Unremarkable Skull: Intact Sinuses/Orbits: Paranasal sinuses well aerated. Mastoid air cells are unremarkable. Orbits unremarkable. Other: None. IMPRESSION: Small (4 mm) focus of high attenuation within the anterior right frontal lobe white matter favored to represent focus of intraparenchymal blood products. Atrophy and chronic microvascular ischemic changes. Critical Value/emergent results were called by telephone at the time of interpretation on 11/25/2018 at 3:25 pm to providerZackowski, MD, who verbally acknowledged these results. Electronically Signed   By: Lovey Newcomer M.D.   On: 11/25/2018 15:27   Mr Jodene Nam Head Wo Contrast  Result Date: 11/26/2018 CLINICAL DATA:  Altered mental status for 2 days. EXAM: MRI HEAD WITHOUT CONTRAST MRA HEAD WITHOUT CONTRAST TECHNIQUE: Multiplanar, multiecho pulse sequences of the brain and surrounding structures were obtained without intravenous contrast. Angiographic images of the head were obtained using MRA technique without contrast. COMPARISON:  Head CT performed earlier the same day 11/26/2018, head CT 11/25/2018, brain MRI 11/06/2018, MRA head 11/22/2015 FINDINGS: MRI HEAD FINDINGS Brain: Multiple sequences are motion degraded. No restricted diffusion is demonstrated to suggest acute infarction. Redemonstrated advanced chronic small vessel ischemic disease. Multiple small chronic lacunar infarcts and prominent perivascular spaces within the bilateral basal ganglia and thalami. Small focus of SWI signal loss within the anterior right frontal lobe white matter corresponding with the probable small acute/subacute hemorrhage demonstrated on head CT 11/25/2018. There are multiple small chronic  microhemorrhages within the bilateral thalami and left cerebellum suggestive of hypertensive microangiopathy. Moderate generalized parenchymal atrophy. No midline shift or extra-axial fluid collection. No evidence of intracranial mass. Vascular: Reported separately Skull and upper cervical spine: Normal marrow signal. Sinuses/Orbits: Imaged orbits demonstrate no acute abnormality. Mild scattered paranasal sinus mucosal thickening. No significant mastoid effusion. MRA HEAD FINDINGS The right internal carotid artery siphon is patent. Apparent moderate focal stenosis of the distal  cavernous/paraclinoid right internal carotid artery. The left internal carotid artery siphon is patent with no more than mild luminal narrowing. The right middle and anterior cerebral arteries are patent without high-grade proximal stenosis. The left middle and anterior cerebral arteries are patent without high-grade proximal stenosis. The intracranial vertebral arteries are patent bilaterally. Apparent mild/moderate segmental stenosis of the intracranial right vertebral artery. No significant stenosis within the left vertebral artery. The basilar artery is widely patent without significant stenosis. The bilateral posterior cerebral arteries are patent without high-grade proximal stenosis. No intracranial aneurysm is identified. IMPRESSION: MRI brain: Motion degraded exam. No evidence of acute infarct. Small focus of susceptibility weighted signal loss within the anterior right frontal lobe white matter corresponding with the probable acute/subacute hemorrhage described on head CT 11/25/2018. Moderate generalized parenchymal atrophy with advanced chronic small vessel ischemic disease. Multiple chronic microhemorrhages in a distribution suggestive of hypertensive microangiopathy. MRA head: Intracranial atherosclerotic disease as detailed, most notably as follows. Apparent moderate focal stenosis of the distal cavernous/paraclinoid right  internal carotid artery. Apparent mild/moderate segmental stenosis of the intracranial right vertebral artery. No intracranial aneurysm is identified. Electronically Signed   By: Kellie Simmering   On: 11/26/2018 10:33   Mr Brain Wo Contrast  Result Date: 11/26/2018 CLINICAL DATA:  Altered mental status for 2 days. EXAM: MRI HEAD WITHOUT CONTRAST MRA HEAD WITHOUT CONTRAST TECHNIQUE: Multiplanar, multiecho pulse sequences of the brain and surrounding structures were obtained without intravenous contrast. Angiographic images of the head were obtained using MRA technique without contrast. COMPARISON:  Head CT performed earlier the same day 11/26/2018, head CT 11/25/2018, brain MRI 11/06/2018, MRA head 11/22/2015 FINDINGS: MRI HEAD FINDINGS Brain: Multiple sequences are motion degraded. No restricted diffusion is demonstrated to suggest acute infarction. Redemonstrated advanced chronic small vessel ischemic disease. Multiple small chronic lacunar infarcts and prominent perivascular spaces within the bilateral basal ganglia and thalami. Small focus of SWI signal loss within the anterior right frontal lobe white matter corresponding with the probable small acute/subacute hemorrhage demonstrated on head CT 11/25/2018. There are multiple small chronic microhemorrhages within the bilateral thalami and left cerebellum suggestive of hypertensive microangiopathy. Moderate generalized parenchymal atrophy. No midline shift or extra-axial fluid collection. No evidence of intracranial mass. Vascular: Reported separately Skull and upper cervical spine: Normal marrow signal. Sinuses/Orbits: Imaged orbits demonstrate no acute abnormality. Mild scattered paranasal sinus mucosal thickening. No significant mastoid effusion. MRA HEAD FINDINGS The right internal carotid artery siphon is patent. Apparent moderate focal stenosis of the distal cavernous/paraclinoid right internal carotid artery. The left internal carotid artery siphon is  patent with no more than mild luminal narrowing. The right middle and anterior cerebral arteries are patent without high-grade proximal stenosis. The left middle and anterior cerebral arteries are patent without high-grade proximal stenosis. The intracranial vertebral arteries are patent bilaterally. Apparent mild/moderate segmental stenosis of the intracranial right vertebral artery. No significant stenosis within the left vertebral artery. The basilar artery is widely patent without significant stenosis. The bilateral posterior cerebral arteries are patent without high-grade proximal stenosis. No intracranial aneurysm is identified. IMPRESSION: MRI brain: Motion degraded exam. No evidence of acute infarct. Small focus of susceptibility weighted signal loss within the anterior right frontal lobe white matter corresponding with the probable acute/subacute hemorrhage described on head CT 11/25/2018. Moderate generalized parenchymal atrophy with advanced chronic small vessel ischemic disease. Multiple chronic microhemorrhages in a distribution suggestive of hypertensive microangiopathy. MRA head: Intracranial atherosclerotic disease as detailed, most notably as follows. Apparent moderate focal stenosis of the  distal cavernous/paraclinoid right internal carotid artery. Apparent mild/moderate segmental stenosis of the intracranial right vertebral artery. No intracranial aneurysm is identified. Electronically Signed   By: Kellie Simmering   On: 11/26/2018 10:33   Mr Brain W Wo Contrast  Result Date: 11/07/2018 CLINICAL DATA:  Headache with weakness and vertigo EXAM: MRI HEAD WITHOUT AND WITH CONTRAST TECHNIQUE: Multiplanar, multiecho pulse sequences of the brain and surrounding structures were obtained without and with intravenous contrast. CONTRAST:  7 mL Gadavist COMPARISON:  Brain MRI 11/22/2015 FINDINGS: BRAIN: There is no acute infarct, acute hemorrhage or extra-axial collection. Diffuse confluent hyperintense  T2-weighted signal within the periventricular, deep and juxtacortical white matter, most commonly due to chronic ischemic microangiopathy. There is generalized atrophy without lobar predilection. The midline structures are normal. No abnormal contrast enhancement. INTERNAL AUDITORY CANALS: There is no cerebellopontine angle mass. The cochleae and semicircular canals are normal. No focal abnormality along the course of the 7th and 8th cranial nerves. Normal porus acusticus and vestibular aqueduct bilaterally. VASCULAR: Approximately 5 foci of chronic microhemorrhage in a predominantly central distribution. The major intracranial arterial and venous sinus flow voids are normal. SKULL AND UPPER CERVICAL SPINE: Calvarial bone marrow signal is normal. There is no skull base mass. The visualized upper cervical spine and soft tissues are normal. SINUSES/ORBITS: There are no fluid levels or advanced mucosal thickening. The mastoid air cells and middle ear cavities are free of fluid. The orbits are normal. IMPRESSION: 1. No acute intracranial abnormality. 2. Normal appearance of the internal auditory canals. 3. Severe chronic ischemic microangiopathy, unchanged. 4. Multiple central chronic microhemorrhages, most consistent with chronic hypertensive angiopathy. Electronically Signed   By: Ulyses Jarred M.D.   On: 11/07/2018 00:49    Micro Results   Recent Results (from the past 240 hour(s))  SARS CORONAVIRUS 2 (TAT 6-24 HRS) Nasopharyngeal Nasopharyngeal Swab     Status: None   Collection Time: 11/25/18  5:39 PM   Specimen: Nasopharyngeal Swab  Result Value Ref Range Status   SARS Coronavirus 2 NEGATIVE NEGATIVE Final    Comment: (NOTE) SARS-CoV-2 target nucleic acids are NOT DETECTED. The SARS-CoV-2 RNA is generally detectable in upper and lower respiratory specimens during the acute phase of infection. Negative results do not preclude SARS-CoV-2 infection, do not rule out co-infections with other pathogens,  and should not be used as the sole basis for treatment or other patient management decisions. Negative results must be combined with clinical observations, patient history, and epidemiological information. The expected result is Negative. Fact Sheet for Patients: SugarRoll.be Fact Sheet for Healthcare Providers: https://www.woods-mathews.com/ This test is not yet approved or cleared by the [place] FDA and  has been authorized for detection and/or diagnosis of SARS-CoV-2 by FDA under an Emergency Use Authorization (EUA). This EUA will remain  in effect (meaning this test can be used) for the duration of the COVID-19 declaration under Section 56 4(b)(1) of the Act, 21 U.S.C. section 360bbb-3(b)(1), unless the authorization is terminated or revoked sooner. Performed at [facility], [place] [address] [place], [place] [address]        Today   Subjective    [name] today has no new complaints, eating and drinking well, voiding well, -No chest pains no palpitations          Patient has been seen and examined prior to discharge   Objective   Blood pressure 137/73, pulse 63, temperature 98.4 F (36.9 C), temperature source Oral, resp. rate 20, height 5\' 3"  (  1.6 m), weight 71.2 kg, SpO2 98 %.   Intake/Output Summary (Last 24 hours) at 11/27/2018 1803 Last data filed at 11/27/2018 0500 Gross per 24 hour  Intake 240 ml  Output -  Net 240 ml    Exam Gen:- Awake Alert, no acute distress  HEENT:- [place].AT, No sclera icterus Neck-Supple Neck,No JVD,.  Lungs-  CTAB , good air movement bilaterally  CV- S1, S2 normal, regular Abd-  +ve B.Sounds, Abd Soft, No tenderness,   Extremity/Skin:- No  edema,   good pulses Psych-affect is appropriate, oriented x3 Neuro-generalized weakness, no new focal deficits, no tremors , concerns about gait   Data Review   CBC w Diff:  Lab Results  Component Value Date   WBC 4.4  11/25/2018   HGB 13.5 11/25/2018   HGB 14.7 04/16/2015   HCT 43.3 11/25/2018   HCT 44.2 04/16/2015   PLT 278 11/25/2018   PLT 332 04/16/2015   LYMPHOPCT 35 11/25/2018   LYMPHOPCT 32.4 04/16/2015   MONOPCT 12 11/25/2018   MONOPCT 6.3 04/16/2015   EOSPCT 3 11/25/2018   EOSPCT 2.7 04/16/2015   BASOPCT 1 11/25/2018   BASOPCT 1.1 04/16/2015    CMP:  Lab Results  Component Value Date   NA 141 11/27/2018   NA 144 04/16/2015   K 3.2 (L) 11/27/2018   K 3.9 04/16/2015   CL 106 11/27/2018   CL 105 04/02/2012   CO2 25 11/27/2018   CO2 29 04/16/2015   BUN 25 (H) 11/27/2018   BUN 12.4 04/16/2015   CREATININE 1.71 (H) 11/27/2018   CREATININE 1.40 (H) 10/26/2018   CREATININE 1.2 (H) 04/16/2015   PROT 7.4 11/25/2018   PROT 8.2 04/16/2015   ALBUMIN 3.9 11/25/2018   ALBUMIN 4.0 04/16/2015   BILITOT 1.1 11/25/2018   BILITOT 0.78 04/16/2015   ALKPHOS 84 11/25/2018   ALKPHOS 103 04/16/2015   AST 21 11/25/2018   AST 18 04/16/2015   ALT 19 11/25/2018   ALT 19 04/16/2015  .   Total Discharge time is about 33 minutes  Roxan Hockey M.D on 11/27/2018 at 6:03 PM  Go to [email or web address] -  for contact info  [facility] - Office  [phone]

## 2018-11-27 NOTE — Progress Notes (Signed)
Physical Therapy Treatment Patient Details Name: [name] MRN: [id number] DOB: [birth year] Today's Date: 11/27/2018    History of Present Illness [name] is a 84 y.o. female with a history of colon cancer diagnosed in 2011 status post polypectomy, hypertension, hypothyroidism, pulmonary embolism earlier this year on anticoagulation therapy.  Patient seen for a brief episode of unresponsiveness that lasted approximately 30 seconds earlier this morning.  Patient was conscious throughout the episode.  Patient was trying to lift herself out of a chair when her arms began to shake and she was unable to respond or move for approximately 30 seconds.  No focal weakness, numbness, slurred speech, vision changes.  She has had no prior episodes.  Of an MRI on 8/25 due to headache with weakness and vertigo.  The MRI showed 5 micro hemorrhages that is thought to be the result of chronic hypertensive angiopathy.  No palliating or provoking factors.  Patient feels fine now.    PT Comments    Patient demonstrates good return for using her tripod cane during gait training with mostly 2 point gait pattern, no loss of balance on level, inclined, declined surfaces and did not require use of side rails in hallways.  Patient limited for attempting stairs due to mild discomfort in right knee which is baseline per patient due to arthritis and occasionally has to wear knee support for right knee when walking longer distances.  Patient tolerated staying up in chair after therapy.  Patient will benefit from continued physical therapy in hospital and recommended venue below to increase strength, balance, endurance for safe ADLs and gait.   Follow Up Recommendations  Home health PT;Supervision - Intermittent     Equipment Recommendations  None recommended by PT    Recommendations for Other Services       Precautions / Restrictions Precautions Precautions: None Restrictions Weight Bearing  Restrictions: No    Mobility  Bed Mobility Overal bed mobility: Modified Independent             General bed mobility comments: Patient presents seated in chair  Transfers Overall transfer level: Needs assistance Equipment used: None;Straight cane Transfers: Sit to/from Stand;Stand Pivot Transfers Sit to Stand: Modified independent (Device/Increase time);Supervision Stand pivot transfers: Modified independent (Device/Increase time);Supervision       General transfer comment: slightly labored  Ambulation/Gait Ambulation/Gait assistance: Supervision;Modified independent (Device/Increase time) Gait Distance (Feet): 200 Feet Assistive device: Straight cane Gait Pattern/deviations: Step-through pattern;Decreased step length - left;Decreased step length - right;Decreased stance time - left;Decreased stride length Gait velocity: decreased   General Gait Details: demonstrates mostly 2 point gait pattern using tripod cane without loss of balance on level, inclined and declined surfaces, limited mostly due to c/o right knee discomfort due to arthritis per patient and occasionally uses knee support for right knee   Stairs             Wheelchair Mobility    Modified Rankin (Stroke Patients Only)       Balance Overall balance assessment: Needs assistance Sitting-balance support: Feet supported;No upper extremity supported Sitting balance-Leahy Scale: Good Sitting balance - Comments: seated in chair   Standing balance support: During functional activity;No upper extremity supported Standing balance-Leahy Scale: Fair Standing balance comment: fair/good using SPC                            Cognition Arousal/Alertness: Awake/alert Behavior During Therapy: WFL for tasks assessed/performed Overall Cognitive Status: Within Functional  Limits for tasks assessed                                        Exercises General Exercises - Lower  Extremity Long Arc Quad: Seated;AROM;Strengthening;Both;15 reps Hip Flexion/Marching: Seated;AROM;Strengthening;Both;15 reps Toe Raises: Seated;AROM;Strengthening;Both;15 reps Heel Raises: Seated;AROM;Strengthening;Both;15 reps    General Comments        Pertinent Vitals/Pain Pain Assessment: Faces Faces Pain Scale: Hurts a little bit Pain Location: right knee arthritc pain Pain Descriptors / Indicators: Aching;Discomfort Pain Intervention(s): Limited activity within patient's tolerance;Monitored during session    Home Living Family/patient expects to be discharged to:: Private residence Living Arrangements: Alone Available Help at Discharge: Family;Available PRN/intermittently Type of Home: House Home Access: Stairs to enter Entrance Stairs-Rails: None Home Layout: One level Home Equipment: Environmental consultant - 4 wheels;Shower seat;Bedside commode;Cane - single point Additional Comments: has tripod cane    Prior Function Level of Independence: Independent with assistive device(s)      Comments: household ambulation without AD, uses tripod cane for short distanced community ambulation; independent in ADLs   PT Goals (current goals can now be found in the care plan section) Acute Rehab PT Goals Patient Stated Goal: return home with family to assist PT Goal Formulation: With patient Time For Goal Achievement: 11/28/18 Potential to Achieve Goals: Good    Frequency    Min 2X/week      PT Plan Current plan remains appropriate    Co-evaluation              AM-PAC PT "6 Clicks" Mobility   Outcome Measure  Help needed turning from your back to your side while in a flat bed without using bedrails?: None Help needed moving from lying on your back to sitting on the side of a flat bed without using bedrails?: None Help needed moving to and from a bed to a chair (including a wheelchair)?: None Help needed standing up from a chair using your arms (e.g., wheelchair or bedside  chair)?: None Help needed to walk in hospital room?: A Little Help needed climbing 3-5 steps with a railing? : A Little 6 Click Score: 22    End of Session   Activity Tolerance: Patient tolerated treatment well;Patient limited by fatigue Patient left: in chair;with call bell/phone within reach Nurse Communication: Mobility status PT Visit Diagnosis: Unsteadiness on feet (R26.81);Other abnormalities of gait and mobility (R26.89);Muscle weakness (generalized) (M62.81)     Time: [id number] PT Time Calculation (min) (ACUTE ONLY): 26 min  Charges:  $Gait Training: 8-22 mins $Therapeutic Exercise: 8-22 mins                     10:03 AM, 11/27/18 Lonell Grandchild, MPT Physical Therapist with [facility] 336 [phone] office [phone] mobile phone

## 2018-11-27 NOTE — Procedures (Signed)
  [place] A. Merlene Laughter, MD     [email or web address]           HISTORY: Patient 84 year old who presents with episodes of confusion worrisome for complex of seizures.  MEDICATIONS:  Current Facility-Administered Medications:  .  acetaminophen (TYLENOL) tablet 650 mg, 650 mg, Oral, Q4H PRN, 650 mg at 11/25/18 2302 **OR** acetaminophen (TYLENOL) solution 650 mg, 650 mg, Per Tube, Q4H PRN **OR** acetaminophen (TYLENOL) suppository 650 mg, 650 mg, Rectal, Q4H PRN, Stinson, Jacob J, DO .  atorvastatin (LIPITOR) tablet 80 mg, 80 mg, Oral, Daily, Truett Mainland, DO, 80 mg at 11/27/18 0932 .  cycloSPORINE (RESTASIS) 0.05 % ophthalmic emulsion 1 drop, 1 drop, Both Eyes, BID PRN, Truett Mainland, DO .  gabapentin (NEURONTIN) capsule 300 mg, 300 mg, Oral, BID, Truett Mainland, DO, 300 mg at 11/27/18 0932 .  labetalol (NORMODYNE) injection 10 mg, 10 mg, Intravenous, Q4H PRN, Emokpae, Courage, MD .  linaclotide (LINZESS) capsule 72 mcg, 72 mcg, Oral, Daily PRN, Truett Mainland, DO .  metoprolol tartrate (LOPRESSOR) tablet 37.5 mg, 37.5 mg, Oral, BID, Truett Mainland, DO, 37.5 mg at 11/27/18 0932 .  pantoprazole (PROTONIX) injection 40 mg, 40 mg, Intravenous, QHS, Truett Mainland, DO, 40 mg at 11/26/18 2102 .  senna-docusate (Senokot-S) tablet 1 tablet, 1 tablet, Oral, BID, Truett Mainland, DO, 1 tablet at 11/27/18 0932     ANALYSIS: A 16 channel recording using standard 10 20 measurements is conducted for 25 minutes.  There is a well-formed posterior rhythm of 10 Hz which attenuates with eye opening.  There is beta activity observed in the frontal areas.  The recording transitions to generalized theta slowing indicative of drowsiness.  There is some episodes of sharp wave involving the vertex area along with the spindles and K complexes indicating stage II non-REM sleep.  Photic stimulation is carried out without abnormal changes in the background activity.  There is no focal  or lateral slowing.  There is no epileptiform activity is observed.   IMPRESSION: 1.  This is a normal recording of awake and sleep states.      Tsur[place]o Murtha A. Merlene Laughter, M.D.  Diplomate, Tax adviser of Psychiatry and Neurology ( Neurology).

## 2018-11-27 NOTE — Progress Notes (Signed)
Nsg Discharge Note  Admit Date:  11/25/2018 Discharge date: 11/27/2018   Renae Fickle to be D/C'd Home per MD order.  AVS completed.  Copy for chart, and copy for patient signed, and dated. Patient/caregiver able to verbalize understanding.  Discharge Medication: Allergies as of 11/27/2018   No Known Allergies     Medication List    STOP taking these medications   apixaban 2.5 MG Tabs tablet Commonly known as: Eliquis     TAKE these medications   acetaminophen 500 MG tablet Commonly known as: TYLENOL Take 1,000 mg by mouth every 8 (eight) hours as needed (cold).   atorvastatin 40 MG tablet Commonly known as: LIPITOR Take 2 tablets (80 mg total) by mouth daily.   cholecalciferol 25 MCG (1000 UT) tablet Commonly known as: VITAMIN D3 Take 1,000 Units by mouth daily.   gabapentin 300 MG capsule Commonly known as: NEURONTIN Take 1 in the morning and 1 in the evening What changed:   how much to take  how to take this  when to take this  additional instructions   linaclotide 72 MCG capsule Commonly known as: Linzess TAKE 1 CAPSULE BY MOUTH DAILY BEFORE BREAKFAST. What changed:   how much to take  how to take this  when to take this  reasons to take this  additional instructions   metoprolol tartrate 25 MG tablet Commonly known as: LOPRESSOR Take 1.5 tablets (37.5 mg total) by mouth 2 (two) times daily.   Restasis 0.05 % ophthalmic emulsion Generic drug: cycloSPORINE Place 1 drop into both eyes 2 (two) times daily as needed (chronic dry eye).       Discharge Assessment: Vitals:   11/27/18 1200 11/27/18 1600  BP: (!) 142/77 137/73  Pulse: 62 63  Resp: 18 20  Temp: 98.5 F (36.9 C) 98.4 F (36.9 C)  SpO2: 98% 98%   Skin clean, dry and intact without evidence of skin break down, no evidence of skin tears noted. IV catheter discontinued intact. Site without signs and symptoms of complications - no redness or edema noted at insertion site,  patient denies c/o pain - only slight tenderness at site.  Dressing with slight pressure applied.  D/c Instructions-Education: Discharge instructions given to patient/family with verbalized understanding. D/c education completed with patient/family including follow up instructions, medication list, d/c activities limitations if indicated, with other d/c instructions as indicated by MD - patient able to verbalize understanding, all questions fully answered. Patient instructed to return to ED, call 911, or call MD for any changes in condition.  Patient escorted via [place], and D/C home via private auto.  Loa Socks, RN 11/27/2018 6:09 PM

## 2018-11-27 NOTE — Progress Notes (Signed)
Home Health Choices:  [facility] [phone]  [place] my Favorites Quality of Patient Care Rating 4 out of 5 stars Patient Survey Summary Rating 4 out of [place] [phone]  [place] my Favorites Quality of Patient Care Rating 3 out of 5 stars Patient Survey Summary Rating 5 out of [place] [phone]  [place] my Favorites Quality of Patient Care Rating 3 out of 5 stars Patient Survey Summary Rating 4 out of [place] [phone]  [place] my Favorites Quality of Patient Care Rating 3  out of 5 stars Patient Survey Summary Rating 4 out of [place] [phone]) [phone]  Add [facility] my Favorites Quality of Patient Care Rating 4  out of 5 stars Patient Survey Summary Rating 3 out of 5 stars [place] [phone]  [place] my Favorites Quality of Patient Care Rating 4 out of 5 stars Patient Survey Summary Rating 4 out of 5 stars [employer] [phone]  Add [facility] my Favorites Quality of Patient Care Rating 4 out of 5 stars Patient Survey Summary Rating 4 out of 5 stars [place] [phone]  [place] my Favorites Quality of Patient Care Rating 4 out of 5 stars Patient Survey Summary Rating 4 out of 5 stars [place] AGE [phone]  [place] my Favorites Quality of Patient Care Rating 3 out of 5 stars Patient Survey Summary Rating 3 out of 5 stars [facility] [place] [phone]  Add [facility] [place] my Favorites Quality of Patient Care Rating 3  out of 5 stars Patient Survey Summary Rating 4 out of 5 stars [place] [phone]  [place] my Favorites Quality of Patient Care Rating 3 out of 5 stars Patient Survey Summary Rating 4 out of 5 stars [facility] [phone]  Add [facility] my Favorites Quality of Patient Care Rating 3  out of 5 stars Patient Survey Summary Rating 3 out of 5 stars [place] [phone]  Add [facility] my Favorites Quality of Patient Care Rating 3  out of 5 stars [place] [phone]  [place] my Favorites

## 2018-11-27 NOTE — Progress Notes (Signed)
EEG complete - results pending 

## 2018-11-29 DIAGNOSIS — M542 Cervicalgia: Secondary | ICD-10-CM | POA: Diagnosis not present

## 2018-11-29 DIAGNOSIS — G890 Central pain syndrome: Secondary | ICD-10-CM | POA: Diagnosis not present

## 2018-11-29 DIAGNOSIS — G4701 Insomnia due to medical condition: Secondary | ICD-10-CM | POA: Diagnosis not present

## 2018-11-29 DIAGNOSIS — R5383 Other fatigue: Secondary | ICD-10-CM | POA: Diagnosis not present

## 2018-12-01 DIAGNOSIS — M170 Bilateral primary osteoarthritis of knee: Secondary | ICD-10-CM | POA: Diagnosis not present

## 2018-12-01 DIAGNOSIS — Z86711 Personal history of pulmonary embolism: Secondary | ICD-10-CM | POA: Diagnosis not present

## 2018-12-01 DIAGNOSIS — N183 Chronic kidney disease, stage 3 (moderate): Secondary | ICD-10-CM | POA: Diagnosis not present

## 2018-12-01 DIAGNOSIS — I129 Hypertensive chronic kidney disease with stage 1 through stage 4 chronic kidney disease, or unspecified chronic kidney disease: Secondary | ICD-10-CM | POA: Diagnosis not present

## 2018-12-01 DIAGNOSIS — Z85038 Personal history of other malignant neoplasm of large intestine: Secondary | ICD-10-CM | POA: Diagnosis not present

## 2018-12-01 DIAGNOSIS — G459 Transient cerebral ischemic attack, unspecified: Secondary | ICD-10-CM | POA: Diagnosis not present

## 2018-12-01 DIAGNOSIS — E039 Hypothyroidism, unspecified: Secondary | ICD-10-CM | POA: Diagnosis not present

## 2018-12-01 DIAGNOSIS — R531 Weakness: Secondary | ICD-10-CM | POA: Diagnosis not present

## 2018-12-01 DIAGNOSIS — Z9181 History of falling: Secondary | ICD-10-CM | POA: Diagnosis not present

## 2018-12-01 DIAGNOSIS — Z8673 Personal history of transient ischemic attack (TIA), and cerebral infarction without residual deficits: Secondary | ICD-10-CM | POA: Diagnosis not present

## 2018-12-01 DIAGNOSIS — E785 Hyperlipidemia, unspecified: Secondary | ICD-10-CM | POA: Diagnosis not present

## 2018-12-04 ENCOUNTER — Ambulatory Visit: Payer: Medicare Other | Admitting: Gastroenterology

## 2018-12-04 DIAGNOSIS — Z9181 History of falling: Secondary | ICD-10-CM | POA: Diagnosis not present

## 2018-12-04 DIAGNOSIS — E785 Hyperlipidemia, unspecified: Secondary | ICD-10-CM | POA: Diagnosis not present

## 2018-12-04 DIAGNOSIS — R531 Weakness: Secondary | ICD-10-CM | POA: Diagnosis not present

## 2018-12-04 DIAGNOSIS — E039 Hypothyroidism, unspecified: Secondary | ICD-10-CM | POA: Diagnosis not present

## 2018-12-04 DIAGNOSIS — M170 Bilateral primary osteoarthritis of knee: Secondary | ICD-10-CM | POA: Diagnosis not present

## 2018-12-04 DIAGNOSIS — N183 Chronic kidney disease, stage 3 (moderate): Secondary | ICD-10-CM | POA: Diagnosis not present

## 2018-12-04 DIAGNOSIS — Z8673 Personal history of transient ischemic attack (TIA), and cerebral infarction without residual deficits: Secondary | ICD-10-CM | POA: Diagnosis not present

## 2018-12-04 DIAGNOSIS — G459 Transient cerebral ischemic attack, unspecified: Secondary | ICD-10-CM | POA: Diagnosis not present

## 2018-12-04 DIAGNOSIS — Z86711 Personal history of pulmonary embolism: Secondary | ICD-10-CM | POA: Diagnosis not present

## 2018-12-04 DIAGNOSIS — Z85038 Personal history of other malignant neoplasm of large intestine: Secondary | ICD-10-CM | POA: Diagnosis not present

## 2018-12-04 DIAGNOSIS — I129 Hypertensive chronic kidney disease with stage 1 through stage 4 chronic kidney disease, or unspecified chronic kidney disease: Secondary | ICD-10-CM | POA: Diagnosis not present

## 2018-12-05 ENCOUNTER — Other Ambulatory Visit: Payer: Self-pay

## 2018-12-05 ENCOUNTER — Ambulatory Visit (INDEPENDENT_AMBULATORY_CARE_PROVIDER_SITE_OTHER): Payer: Medicare Other | Admitting: Family Medicine

## 2018-12-05 ENCOUNTER — Encounter: Payer: Self-pay | Admitting: Family Medicine

## 2018-12-05 VITALS — BP 130/72 | HR 70 | Temp 98.3°F | Resp 16 | Ht 63.0 in | Wt 156.0 lb

## 2018-12-05 DIAGNOSIS — G459 Transient cerebral ischemic attack, unspecified: Secondary | ICD-10-CM

## 2018-12-05 DIAGNOSIS — N183 Chronic kidney disease, stage 3 unspecified: Secondary | ICD-10-CM

## 2018-12-05 DIAGNOSIS — I618 Other nontraumatic intracerebral hemorrhage: Secondary | ICD-10-CM

## 2018-12-05 DIAGNOSIS — I10 Essential (primary) hypertension: Secondary | ICD-10-CM

## 2018-12-05 MED ORDER — AMLODIPINE BESYLATE 5 MG PO TABS: 5.0000 mg | ORAL_TABLET | Freq: Every day | ORAL | 3 refills | Status: DC

## 2018-12-05 NOTE — Patient Instructions (Addendum)
We will call with results Continue current medications  Plan to repeat CT scan in 3 weeks  F/U as previous

## 2018-12-05 NOTE — Assessment & Plan Note (Addendum)
TIA with micro vascular hemorrhages in the brain.  At this point weighing the risk I think that she needs to stay off of the Eliquis as she is high fall risk and already has a small bleeds until her CT scan showed that these have resolved.  I will plan to repeat a CT of her head in 3 weeks.  At that time we will determine going back on the Eliquis due to her history of pulmonary embolus.  With regards to her blood pressure she is okay to restart the amlodipine 5 mg in addition to her metoprolol.  They will check her blood pressure at home.  We will recheck her renal function sure she is back at her baseline today.  She will follow-up with neurology as scheduled she also has follow-up with hematology in GI coming up.  Continue with home health physical therapy for gait training and strengthening.  She is to use Tylenol or topical Aspercreme Biofreeze IcyHot for her joint pain  Discussed with both patient and her daughter if she has another procedure will admit the feet.  She would need around-the-clock care whether that is moving in with her daughter getting nursing 24 hours or assisted living

## 2018-12-05 NOTE — Progress Notes (Signed)
Subjective:    Patient ID: [name], female    DOB: 06-03-1934, 84 y.o.   MRN: [id number]  Patient presents for Hospital F/U (TIA- weakness to L side, new bleed to front of head- HA, shoulder pain- arthritis)  Patient here for hospital follow-up.  She was admitted to the hospital secondary to episode where she was not responding.  She states that she was trying to lift herself out of a chair when her arms began to shake and she could not move for about 30 seconds.  She had an MRI back in August which showed microvascular changes with small microhemorrhages thought to be secondary to her hypertension.  We have been trying to get her blood pressure medication steady for the past few weeks which was improved at her last visit.  In the emergency room her CT of head showed a small 4 mm microhemorrhage She had an MRI done which showed that the area was starting to resolve.  Dr. Merlene Laughter her neurologist was involved. He needs to have headaches which she was already being treated for migraine headaches prior to this event. Did hold her Eliquis was advised to hold it for 2 weeks and to avoid anti-inflammatories and aspirin for now. EEG was normal   She does have history of PE and a history of cancer so she is at high risk for recurrence of embolism if she is off of blood thinners.  CKD her creatinine was at 1.7 up from her baseline she is due for recheck today.  Blood pressure was actually fairly stable on the metoprolol 37.5mg  twice a day, when she was in the hospital but prior to BP was elevated 188-200/90-110  She is left the hospital she has had occasional sharp pains in her head.  She notes that she still has significant weakness in her legs physical therapy is going to start coming out.  She feels like her left side is weaker than the right prior to her having the TIA.  She is not had any vomiting episodes no change in her vision no chest pain no shortness of breath.  She was seen by  neurology a few days after her discharge they started her back on amlodipine 5 mg for her blood pressure she is awaiting a new blood pressure machine so that she can check it at home.     Review Of Systems:  GEN- denies fatigue, fever, weight loss,weakness, recent illness HEENT- denies eye drainage, change in vision, nasal discharge, CVS- denies chest pain, palpitations RESP- denies SOB, cough, wheeze ABD- denies N/V, change in stools, abd pain GU- denies dysuria, hematuria, dribbling, incontinence MSK- denies joint pain, muscle aches, injury Neuro- denies headache, dizziness, syncope, seizure activity       Objective:    BP 130/72   Pulse 70   Temp 98.3 F (36.8 C) (Oral)   Resp 16   Ht 5\' 3"  (1.6 m)   Wt 156 lb (70.8 kg)   SpO2 97%   BMI 27.63 kg/m  GEN- NAD, alert and oriented x3, unsteady gait, walking with walker  HEENT- PERRL, EOMI, non injected sclera, pink conjunctiva, MMM, oropharynx clear Neck- Supple, no thyromegaly CVS- RRR, no murmur RESP-CTAB ABD-NABS,soft,NT,ND EXT- No edema Pulses- Radial, DP- 2+        Assessment & Plan:      Problem List Items Addressed This Visit      Unprioritized   CKD (chronic kidney disease), stage III ([place])   Relevant Orders  CBC with Differential/Platelet   Basic metabolic panel   Essential hypertension   Relevant Medications   amLODipine (NORVASC) 5 MG tablet   Other Relevant Orders   CT Head Wo Contrast   Silent micro-hemorrhage of brain ([place]) - Primary   Relevant Medications   amLODipine (NORVASC) 5 MG tablet   Other Relevant Orders   CBC with Differential/Platelet   Basic metabolic panel   CT Head Wo Contrast   TIA (transient ischemic attack)    TIA with micro vascular hemorrhages in the brain.  At this point weighing the risk I think that she needs to stay off of the Eliquis as she is high fall risk and already has a small bleeds until her CT scan showed that these have resolved.  I will plan to repeat a  CT of her head in 3 weeks.  At that time we will determine going back on the Eliquis due to her history of pulmonary embolus.  With regards to her blood pressure she is okay to restart the amlodipine 5 mg in addition to her metoprolol.  They will check her blood pressure at home.  We will recheck her renal function sure she is back at her baseline today.  She will follow-up with neurology as scheduled she also has follow-up with hematology in GI coming up.  Continue with home health physical therapy for gait training and strengthening.  She is to use Tylenol or topical Aspercreme Biofreeze IcyHot for her joint pain  Discussed with both patient and her daughter if she has another procedure will admit the feet.  She would need around-the-clock care whether that is moving in with her daughter getting nursing 24 hours or assisted living      Relevant Medications   amLODipine (NORVASC) 5 MG tablet   Other Relevant Orders   CT Head Wo Contrast      Note: This dictation was prepared with Dragon dictation along with smaller phrase technology. Any transcriptional errors that result from this process are unintentional.

## 2018-12-06 ENCOUNTER — Other Ambulatory Visit: Payer: Self-pay | Admitting: Pharmacist

## 2018-12-06 ENCOUNTER — Telehealth: Payer: Self-pay | Admitting: Family Medicine

## 2018-12-06 LAB — BASIC METABOLIC PANEL
BUN/Creatinine Ratio: 13 (calc) (ref 6–22)
BUN: 19 mg/dL (ref 7–25)
CO2: 26 mmol/L (ref 20–32)
Calcium: 9.7 mg/dL (ref 8.6–10.4)
Chloride: 105 mmol/L (ref 98–110)
Creat: 1.5 mg/dL — ABNORMAL HIGH (ref 0.60–0.88)
Glucose, Bld: 101 mg/dL — ABNORMAL HIGH (ref 65–99)
Potassium: 4.3 mmol/L (ref 3.5–5.3)
Sodium: 142 mmol/L (ref 135–146)

## 2018-12-06 LAB — CBC WITH DIFFERENTIAL/PLATELET
Absolute Monocytes: 495 cells/uL (ref 200–950)
Basophils Absolute: 51 cells/uL (ref 0–200)
Basophils Relative: 1 %
Eosinophils Absolute: 107 cells/uL (ref 15–500)
Eosinophils Relative: 2.1 %
HCT: 40.4 % (ref 35.0–45.0)
Hemoglobin: 13.5 g/dL (ref 11.7–15.5)
Lymphs Abs: 1933 cells/uL (ref 850–3900)
MCH: 31.6 pg (ref 27.0–33.0)
MCHC: 33.4 g/dL (ref 32.0–36.0)
MCV: 94.6 fL (ref 80.0–100.0)
MPV: 10.7 fL (ref 7.5–12.5)
Monocytes Relative: 9.7 %
Neutro Abs: 2514 cells/uL (ref 1500–7800)
Neutrophils Relative %: 49.3 %
Platelets: 269 10*3/uL (ref 140–400)
RBC: 4.27 10*6/uL (ref 3.80–5.10)
RDW: 13.8 % (ref 11.0–15.0)
Total Lymphocyte: 37.9 %
WBC: 5.1 10*3/uL (ref 3.8–10.8)

## 2018-12-06 NOTE — Telephone Encounter (Signed)
Mrs. Oneida Alar from [place] left vm requesting verbal orders.  CB# [phone]

## 2018-12-06 NOTE — Telephone Encounter (Signed)
Call placed to [name], [facility] PT with [facility]. [place].

## 2018-12-06 NOTE — Patient Outreach (Signed)
[facility]) Care Management  [place] 12/06/2018  [name] 07-May-1934 [id number]  Patient admitted to hospital on 9/13 2/2 TIA with microvascular hemorrhages in the brain.  Eliquis d/c'd with plan for re-evaluation with PCP at f/u appt.  PCP saw patient on 9/23 and patient will remain OFF Eliquis for now due to high fall risk and until bleeding has resolved per CT.    Successful call with patient.  Patient confirms she has stopped Eliquis. She still has Eliquis patient assistance program application and my contact information.  If Eliquis is resumed, she will contact me to re-start application process.    Plan: Will close [facility] pharmacy case and update [place], PharmD, [place] [phone]

## 2018-12-07 DIAGNOSIS — G459 Transient cerebral ischemic attack, unspecified: Secondary | ICD-10-CM | POA: Diagnosis not present

## 2018-12-07 DIAGNOSIS — E785 Hyperlipidemia, unspecified: Secondary | ICD-10-CM | POA: Diagnosis not present

## 2018-12-07 DIAGNOSIS — N183 Chronic kidney disease, stage 3 (moderate): Secondary | ICD-10-CM | POA: Diagnosis not present

## 2018-12-07 DIAGNOSIS — I129 Hypertensive chronic kidney disease with stage 1 through stage 4 chronic kidney disease, or unspecified chronic kidney disease: Secondary | ICD-10-CM | POA: Diagnosis not present

## 2018-12-07 DIAGNOSIS — Z9181 History of falling: Secondary | ICD-10-CM | POA: Diagnosis not present

## 2018-12-07 DIAGNOSIS — Z86711 Personal history of pulmonary embolism: Secondary | ICD-10-CM | POA: Diagnosis not present

## 2018-12-07 DIAGNOSIS — M170 Bilateral primary osteoarthritis of knee: Secondary | ICD-10-CM | POA: Diagnosis not present

## 2018-12-07 DIAGNOSIS — Z8673 Personal history of transient ischemic attack (TIA), and cerebral infarction without residual deficits: Secondary | ICD-10-CM | POA: Diagnosis not present

## 2018-12-07 DIAGNOSIS — Z85038 Personal history of other malignant neoplasm of large intestine: Secondary | ICD-10-CM | POA: Diagnosis not present

## 2018-12-07 DIAGNOSIS — R531 Weakness: Secondary | ICD-10-CM | POA: Diagnosis not present

## 2018-12-07 DIAGNOSIS — E039 Hypothyroidism, unspecified: Secondary | ICD-10-CM | POA: Diagnosis not present

## 2018-12-07 NOTE — Telephone Encounter (Signed)
Call placed to [facility] PT. [facility].

## 2018-12-10 DIAGNOSIS — G459 Transient cerebral ischemic attack, unspecified: Secondary | ICD-10-CM | POA: Diagnosis not present

## 2018-12-10 DIAGNOSIS — I129 Hypertensive chronic kidney disease with stage 1 through stage 4 chronic kidney disease, or unspecified chronic kidney disease: Secondary | ICD-10-CM | POA: Diagnosis not present

## 2018-12-10 DIAGNOSIS — Z86711 Personal history of pulmonary embolism: Secondary | ICD-10-CM | POA: Diagnosis not present

## 2018-12-10 DIAGNOSIS — R531 Weakness: Secondary | ICD-10-CM | POA: Diagnosis not present

## 2018-12-10 DIAGNOSIS — Z8673 Personal history of transient ischemic attack (TIA), and cerebral infarction without residual deficits: Secondary | ICD-10-CM | POA: Diagnosis not present

## 2018-12-10 DIAGNOSIS — N183 Chronic kidney disease, stage 3 (moderate): Secondary | ICD-10-CM | POA: Diagnosis not present

## 2018-12-10 DIAGNOSIS — E039 Hypothyroidism, unspecified: Secondary | ICD-10-CM | POA: Diagnosis not present

## 2018-12-10 DIAGNOSIS — Z9181 History of falling: Secondary | ICD-10-CM | POA: Diagnosis not present

## 2018-12-10 DIAGNOSIS — E785 Hyperlipidemia, unspecified: Secondary | ICD-10-CM | POA: Diagnosis not present

## 2018-12-10 DIAGNOSIS — Z85038 Personal history of other malignant neoplasm of large intestine: Secondary | ICD-10-CM | POA: Diagnosis not present

## 2018-12-10 DIAGNOSIS — M170 Bilateral primary osteoarthritis of knee: Secondary | ICD-10-CM | POA: Diagnosis not present

## 2018-12-11 ENCOUNTER — Telehealth: Payer: Self-pay | Admitting: *Deleted

## 2018-12-11 DIAGNOSIS — R531 Weakness: Secondary | ICD-10-CM | POA: Diagnosis not present

## 2018-12-11 DIAGNOSIS — N183 Chronic kidney disease, stage 3 (moderate): Secondary | ICD-10-CM | POA: Diagnosis not present

## 2018-12-11 DIAGNOSIS — G459 Transient cerebral ischemic attack, unspecified: Secondary | ICD-10-CM | POA: Diagnosis not present

## 2018-12-11 DIAGNOSIS — Z8673 Personal history of transient ischemic attack (TIA), and cerebral infarction without residual deficits: Secondary | ICD-10-CM | POA: Diagnosis not present

## 2018-12-11 DIAGNOSIS — Z86711 Personal history of pulmonary embolism: Secondary | ICD-10-CM | POA: Diagnosis not present

## 2018-12-11 DIAGNOSIS — Z85038 Personal history of other malignant neoplasm of large intestine: Secondary | ICD-10-CM | POA: Diagnosis not present

## 2018-12-11 DIAGNOSIS — E039 Hypothyroidism, unspecified: Secondary | ICD-10-CM | POA: Diagnosis not present

## 2018-12-11 DIAGNOSIS — I129 Hypertensive chronic kidney disease with stage 1 through stage 4 chronic kidney disease, or unspecified chronic kidney disease: Secondary | ICD-10-CM | POA: Diagnosis not present

## 2018-12-11 DIAGNOSIS — M170 Bilateral primary osteoarthritis of knee: Secondary | ICD-10-CM | POA: Diagnosis not present

## 2018-12-11 DIAGNOSIS — Z9181 History of falling: Secondary | ICD-10-CM | POA: Diagnosis not present

## 2018-12-11 DIAGNOSIS — E785 Hyperlipidemia, unspecified: Secondary | ICD-10-CM | POA: Diagnosis not present

## 2018-12-11 NOTE — Telephone Encounter (Signed)
Multiple calls placed to [facility] with no answer and no return call.   Message to be closed.

## 2018-12-11 NOTE — Telephone Encounter (Signed)
Received call from [place], [place] with [facility]. [phone]~ telephone.   Requested VO to extend OT services 2x weekly x3 weeks, then 1x weekly x1 week for strength, endurance, balance and energy conservation.   VO given.

## 2018-12-11 NOTE — Telephone Encounter (Signed)
noted 

## 2018-12-12 DIAGNOSIS — G459 Transient cerebral ischemic attack, unspecified: Secondary | ICD-10-CM | POA: Diagnosis not present

## 2018-12-12 DIAGNOSIS — E039 Hypothyroidism, unspecified: Secondary | ICD-10-CM | POA: Diagnosis not present

## 2018-12-12 DIAGNOSIS — N183 Chronic kidney disease, stage 3 (moderate): Secondary | ICD-10-CM | POA: Diagnosis not present

## 2018-12-12 DIAGNOSIS — I129 Hypertensive chronic kidney disease with stage 1 through stage 4 chronic kidney disease, or unspecified chronic kidney disease: Secondary | ICD-10-CM | POA: Diagnosis not present

## 2018-12-12 DIAGNOSIS — Z8673 Personal history of transient ischemic attack (TIA), and cerebral infarction without residual deficits: Secondary | ICD-10-CM | POA: Diagnosis not present

## 2018-12-12 DIAGNOSIS — Z86711 Personal history of pulmonary embolism: Secondary | ICD-10-CM | POA: Diagnosis not present

## 2018-12-12 DIAGNOSIS — R531 Weakness: Secondary | ICD-10-CM | POA: Diagnosis not present

## 2018-12-12 DIAGNOSIS — E785 Hyperlipidemia, unspecified: Secondary | ICD-10-CM | POA: Diagnosis not present

## 2018-12-12 DIAGNOSIS — Z9181 History of falling: Secondary | ICD-10-CM | POA: Diagnosis not present

## 2018-12-12 DIAGNOSIS — M170 Bilateral primary osteoarthritis of knee: Secondary | ICD-10-CM | POA: Diagnosis not present

## 2018-12-12 DIAGNOSIS — Z85038 Personal history of other malignant neoplasm of large intestine: Secondary | ICD-10-CM | POA: Diagnosis not present

## 2018-12-13 NOTE — Telephone Encounter (Signed)
Received return call from [place], [facility] PT with [facility]. [phone]~ telephone.   Reports that PT is requesting PT for balance, gait, transfers, fall prevention, and home safety.   VO given.

## 2018-12-14 DIAGNOSIS — E039 Hypothyroidism, unspecified: Secondary | ICD-10-CM | POA: Diagnosis not present

## 2018-12-14 DIAGNOSIS — E785 Hyperlipidemia, unspecified: Secondary | ICD-10-CM | POA: Diagnosis not present

## 2018-12-14 DIAGNOSIS — Z8673 Personal history of transient ischemic attack (TIA), and cerebral infarction without residual deficits: Secondary | ICD-10-CM | POA: Diagnosis not present

## 2018-12-14 DIAGNOSIS — Z86711 Personal history of pulmonary embolism: Secondary | ICD-10-CM | POA: Diagnosis not present

## 2018-12-14 DIAGNOSIS — M170 Bilateral primary osteoarthritis of knee: Secondary | ICD-10-CM | POA: Diagnosis not present

## 2018-12-14 DIAGNOSIS — R531 Weakness: Secondary | ICD-10-CM | POA: Diagnosis not present

## 2018-12-14 DIAGNOSIS — G459 Transient cerebral ischemic attack, unspecified: Secondary | ICD-10-CM | POA: Diagnosis not present

## 2018-12-14 DIAGNOSIS — Z9181 History of falling: Secondary | ICD-10-CM | POA: Diagnosis not present

## 2018-12-14 DIAGNOSIS — Z85038 Personal history of other malignant neoplasm of large intestine: Secondary | ICD-10-CM | POA: Diagnosis not present

## 2018-12-14 DIAGNOSIS — I129 Hypertensive chronic kidney disease with stage 1 through stage 4 chronic kidney disease, or unspecified chronic kidney disease: Secondary | ICD-10-CM | POA: Diagnosis not present

## 2018-12-17 ENCOUNTER — Ambulatory Visit: Payer: Medicare Other | Admitting: Family Medicine

## 2018-12-17 DIAGNOSIS — Z8673 Personal history of transient ischemic attack (TIA), and cerebral infarction without residual deficits: Secondary | ICD-10-CM | POA: Diagnosis not present

## 2018-12-17 DIAGNOSIS — G459 Transient cerebral ischemic attack, unspecified: Secondary | ICD-10-CM | POA: Diagnosis not present

## 2018-12-17 DIAGNOSIS — Z86711 Personal history of pulmonary embolism: Secondary | ICD-10-CM | POA: Diagnosis not present

## 2018-12-17 DIAGNOSIS — E785 Hyperlipidemia, unspecified: Secondary | ICD-10-CM | POA: Diagnosis not present

## 2018-12-17 DIAGNOSIS — E039 Hypothyroidism, unspecified: Secondary | ICD-10-CM | POA: Diagnosis not present

## 2018-12-17 DIAGNOSIS — I129 Hypertensive chronic kidney disease with stage 1 through stage 4 chronic kidney disease, or unspecified chronic kidney disease: Secondary | ICD-10-CM | POA: Diagnosis not present

## 2018-12-17 DIAGNOSIS — Z9181 History of falling: Secondary | ICD-10-CM | POA: Diagnosis not present

## 2018-12-17 DIAGNOSIS — Z85038 Personal history of other malignant neoplasm of large intestine: Secondary | ICD-10-CM | POA: Diagnosis not present

## 2018-12-17 DIAGNOSIS — M170 Bilateral primary osteoarthritis of knee: Secondary | ICD-10-CM | POA: Diagnosis not present

## 2018-12-17 DIAGNOSIS — R531 Weakness: Secondary | ICD-10-CM | POA: Diagnosis not present

## 2018-12-18 DIAGNOSIS — Z8673 Personal history of transient ischemic attack (TIA), and cerebral infarction without residual deficits: Secondary | ICD-10-CM | POA: Diagnosis not present

## 2018-12-18 DIAGNOSIS — E785 Hyperlipidemia, unspecified: Secondary | ICD-10-CM | POA: Diagnosis not present

## 2018-12-18 DIAGNOSIS — E039 Hypothyroidism, unspecified: Secondary | ICD-10-CM | POA: Diagnosis not present

## 2018-12-18 DIAGNOSIS — Z86711 Personal history of pulmonary embolism: Secondary | ICD-10-CM | POA: Diagnosis not present

## 2018-12-18 DIAGNOSIS — Z9181 History of falling: Secondary | ICD-10-CM | POA: Diagnosis not present

## 2018-12-18 DIAGNOSIS — R531 Weakness: Secondary | ICD-10-CM | POA: Diagnosis not present

## 2018-12-18 DIAGNOSIS — M170 Bilateral primary osteoarthritis of knee: Secondary | ICD-10-CM | POA: Diagnosis not present

## 2018-12-18 DIAGNOSIS — I129 Hypertensive chronic kidney disease with stage 1 through stage 4 chronic kidney disease, or unspecified chronic kidney disease: Secondary | ICD-10-CM | POA: Diagnosis not present

## 2018-12-18 DIAGNOSIS — G459 Transient cerebral ischemic attack, unspecified: Secondary | ICD-10-CM | POA: Diagnosis not present

## 2018-12-18 DIAGNOSIS — Z85038 Personal history of other malignant neoplasm of large intestine: Secondary | ICD-10-CM | POA: Diagnosis not present

## 2018-12-19 DIAGNOSIS — E785 Hyperlipidemia, unspecified: Secondary | ICD-10-CM | POA: Diagnosis not present

## 2018-12-19 DIAGNOSIS — Z9181 History of falling: Secondary | ICD-10-CM | POA: Diagnosis not present

## 2018-12-19 DIAGNOSIS — R531 Weakness: Secondary | ICD-10-CM | POA: Diagnosis not present

## 2018-12-19 DIAGNOSIS — Z86711 Personal history of pulmonary embolism: Secondary | ICD-10-CM | POA: Diagnosis not present

## 2018-12-19 DIAGNOSIS — M170 Bilateral primary osteoarthritis of knee: Secondary | ICD-10-CM | POA: Diagnosis not present

## 2018-12-19 DIAGNOSIS — Z85038 Personal history of other malignant neoplasm of large intestine: Secondary | ICD-10-CM | POA: Diagnosis not present

## 2018-12-19 DIAGNOSIS — E039 Hypothyroidism, unspecified: Secondary | ICD-10-CM | POA: Diagnosis not present

## 2018-12-19 DIAGNOSIS — G459 Transient cerebral ischemic attack, unspecified: Secondary | ICD-10-CM | POA: Diagnosis not present

## 2018-12-19 DIAGNOSIS — Z8673 Personal history of transient ischemic attack (TIA), and cerebral infarction without residual deficits: Secondary | ICD-10-CM | POA: Diagnosis not present

## 2018-12-19 DIAGNOSIS — I129 Hypertensive chronic kidney disease with stage 1 through stage 4 chronic kidney disease, or unspecified chronic kidney disease: Secondary | ICD-10-CM | POA: Diagnosis not present

## 2018-12-20 DIAGNOSIS — Z85038 Personal history of other malignant neoplasm of large intestine: Secondary | ICD-10-CM | POA: Diagnosis not present

## 2018-12-20 DIAGNOSIS — R531 Weakness: Secondary | ICD-10-CM | POA: Diagnosis not present

## 2018-12-20 DIAGNOSIS — Z8673 Personal history of transient ischemic attack (TIA), and cerebral infarction without residual deficits: Secondary | ICD-10-CM | POA: Diagnosis not present

## 2018-12-20 DIAGNOSIS — G459 Transient cerebral ischemic attack, unspecified: Secondary | ICD-10-CM | POA: Diagnosis not present

## 2018-12-20 DIAGNOSIS — M170 Bilateral primary osteoarthritis of knee: Secondary | ICD-10-CM | POA: Diagnosis not present

## 2018-12-20 DIAGNOSIS — Z9181 History of falling: Secondary | ICD-10-CM | POA: Diagnosis not present

## 2018-12-20 DIAGNOSIS — I129 Hypertensive chronic kidney disease with stage 1 through stage 4 chronic kidney disease, or unspecified chronic kidney disease: Secondary | ICD-10-CM | POA: Diagnosis not present

## 2018-12-20 DIAGNOSIS — E039 Hypothyroidism, unspecified: Secondary | ICD-10-CM | POA: Diagnosis not present

## 2018-12-20 DIAGNOSIS — Z86711 Personal history of pulmonary embolism: Secondary | ICD-10-CM | POA: Diagnosis not present

## 2018-12-20 DIAGNOSIS — E785 Hyperlipidemia, unspecified: Secondary | ICD-10-CM | POA: Diagnosis not present

## 2018-12-24 DIAGNOSIS — Z85038 Personal history of other malignant neoplasm of large intestine: Secondary | ICD-10-CM | POA: Diagnosis not present

## 2018-12-24 DIAGNOSIS — I129 Hypertensive chronic kidney disease with stage 1 through stage 4 chronic kidney disease, or unspecified chronic kidney disease: Secondary | ICD-10-CM | POA: Diagnosis not present

## 2018-12-24 DIAGNOSIS — E039 Hypothyroidism, unspecified: Secondary | ICD-10-CM | POA: Diagnosis not present

## 2018-12-24 DIAGNOSIS — Z9181 History of falling: Secondary | ICD-10-CM | POA: Diagnosis not present

## 2018-12-24 DIAGNOSIS — Z86711 Personal history of pulmonary embolism: Secondary | ICD-10-CM | POA: Diagnosis not present

## 2018-12-24 DIAGNOSIS — G459 Transient cerebral ischemic attack, unspecified: Secondary | ICD-10-CM | POA: Diagnosis not present

## 2018-12-24 DIAGNOSIS — M170 Bilateral primary osteoarthritis of knee: Secondary | ICD-10-CM | POA: Diagnosis not present

## 2018-12-24 DIAGNOSIS — Z8673 Personal history of transient ischemic attack (TIA), and cerebral infarction without residual deficits: Secondary | ICD-10-CM | POA: Diagnosis not present

## 2018-12-24 DIAGNOSIS — E785 Hyperlipidemia, unspecified: Secondary | ICD-10-CM | POA: Diagnosis not present

## 2018-12-24 DIAGNOSIS — R531 Weakness: Secondary | ICD-10-CM | POA: Diagnosis not present

## 2018-12-26 ENCOUNTER — Ambulatory Visit: Payer: Medicare Other | Admitting: Gastroenterology

## 2018-12-26 ENCOUNTER — Other Ambulatory Visit: Payer: Self-pay

## 2018-12-26 ENCOUNTER — Encounter: Payer: Self-pay | Admitting: Gastroenterology

## 2018-12-26 VITALS — BP 137/79 | HR 67 | Temp 97.0°F | Ht 63.0 in | Wt 155.8 lb

## 2018-12-26 DIAGNOSIS — Z85038 Personal history of other malignant neoplasm of large intestine: Secondary | ICD-10-CM

## 2018-12-26 DIAGNOSIS — Z9181 History of falling: Secondary | ICD-10-CM | POA: Diagnosis not present

## 2018-12-26 DIAGNOSIS — G459 Transient cerebral ischemic attack, unspecified: Secondary | ICD-10-CM | POA: Diagnosis not present

## 2018-12-26 DIAGNOSIS — E785 Hyperlipidemia, unspecified: Secondary | ICD-10-CM | POA: Diagnosis not present

## 2018-12-26 DIAGNOSIS — Z86711 Personal history of pulmonary embolism: Secondary | ICD-10-CM | POA: Diagnosis not present

## 2018-12-26 DIAGNOSIS — I129 Hypertensive chronic kidney disease with stage 1 through stage 4 chronic kidney disease, or unspecified chronic kidney disease: Secondary | ICD-10-CM | POA: Diagnosis not present

## 2018-12-26 DIAGNOSIS — Z8673 Personal history of transient ischemic attack (TIA), and cerebral infarction without residual deficits: Secondary | ICD-10-CM | POA: Diagnosis not present

## 2018-12-26 DIAGNOSIS — R531 Weakness: Secondary | ICD-10-CM | POA: Diagnosis not present

## 2018-12-26 DIAGNOSIS — M170 Bilateral primary osteoarthritis of knee: Secondary | ICD-10-CM | POA: Diagnosis not present

## 2018-12-26 DIAGNOSIS — E039 Hypothyroidism, unspecified: Secondary | ICD-10-CM | POA: Diagnosis not present

## 2018-12-26 NOTE — Patient Instructions (Signed)
We will hold off on a colonoscopy right now due to other health issues.  Please call if any rectal bleeding, abdominal pain, nausea, vomiting, weight loss, or change of bowel habits.  It was good to meet you!  It was a pleasure to see you today. I want to create trusting relationships with patients to provide genuine, compassionate, and quality care. I value your feedback. If you receive a survey regarding your visit,  I greatly appreciate you taking time to fill this out.   Annitta Needs, PhD, ANP-BC [facility]

## 2018-12-26 NOTE — Progress Notes (Signed)
Primary Care Physician:  Alycia Rossetti, MD Primary Gastroenterologist:  Dr. Oneida Alar   Chief Complaint  Patient presents with  . Consult    TCS last done 2015, h/o colon cancer    HPI:   [name] is an 84 y.o. female presenting today with a history of colon cancer in 2011, s/p right hemicolectomy, with last colonoscopy in 2015. Hyperplastic polyps. Here to discuss possible surveillance.   She is present with her daughter, Loletha Carrow. She was recently inpatient with TIA on Eliquis, which is now on hold due to intracranial hemorrhage on CT. No rectal bleeding, abdominal pain, changes in bowel habits. Appetite is good. No weight loss. On Linzess 72 mcg every other day and likes this dosing regimen. No chest pain.Uses rolling walker at home and cane. Will be having repeat CT scan upcoming.  Both daughter and patient are wanting to hold off on colonoscopy unless absolutely necessary.    Past Medical History:  Diagnosis Date  . Allergy   . Arthritis   . Colon cancer ([place])    colon ca dx 07/30/09  . History of cardiac monitoring 07/2017   "Event monitor demonstrated sinus rhythm with isolated PACs and no arrhythmias"  . History of colon cancer 06/2009   found at time of TCS 06/29/09, 1.2cm sessile cecal polyp, no adjuvent therapy needed  . HTN (hypertension)   . Hx of cardiovascular stress test 07/2017   "No diagnostic ST segment changes to indicate ischemia. Small, moderate intensity, reversible apical to basal inferolateral defect consistent with ischemia. This is a low risk study. Nuclear stress EF: 84%."  . Hyperlipidemia   . Hypothyroidism   . PE (pulmonary thromboembolism) ([place])   . Renal disorder    cyst on kidney   . Stroke ([place])   . Vertigo     Past Surgical History:  Procedure Laterality Date  . ABDOMINAL HYSTERECTOMY    . COLON SURGERY  07/2009   right hemicolectomy, no residual colon cancer on path  . COLONOSCOPY  07/16/2010   [id number]  POLYP-TCS 3 YEARS  . COLONOSCOPY N/A 08/02/2013   hyperplastic polyps, surveillance in 2020 if benefits outweight the risks  . COLONOSCOPY  06/2009   1.2 cm sessile cecal polyp which had adenocarcinoma arising in a tubular adenoma.  Marland Kitchen PARTIAL HYSTERECTOMY    . PARTIAL THYMECTOMY    . partial thyroidectomy     benign tumors    Current Outpatient Medications  Medication Sig Dispense Refill  . acetaminophen (TYLENOL) 500 MG tablet Take 1,000 mg by mouth every 8 (eight) hours as needed (cold).    Marland Kitchen amLODipine (NORVASC) 5 MG tablet Take 1 tablet (5 mg total) by mouth daily. 30 tablet 3  . atorvastatin (LIPITOR) 40 MG tablet Take 2 tablets (80 mg total) by mouth daily. 180 tablet 2  . cholecalciferol (VITAMIN D3) 25 MCG (1000 UT) tablet Take 1,000 Units by mouth daily.     Marland Kitchen gabapentin (NEURONTIN) 300 MG capsule Take 1 in the morning and 1 in the evening (Patient taking differently: Take 300 mg by mouth 2 (two) times daily. )    . linaclotide (LINZESS) 72 MCG capsule TAKE 1 CAPSULE BY MOUTH DAILY BEFORE BREAKFAST. (Patient taking differently: Take 72 mcg by mouth daily as needed (constipation). ) 90 capsule 1  . metoprolol tartrate (LOPRESSOR) 25 MG tablet Take 1.5 tablets (37.5 mg total) by mouth 2 (two) times daily. 180 tablet 3  . RESTASIS 0.05 % ophthalmic emulsion  Place 1 drop into both eyes 2 (two) times daily as needed (chronic dry eye).      No current facility-administered medications for this visit.     Allergies as of 12/26/2018  . (No Known Allergies)    Family History  Problem Relation Age of Onset  . Colon cancer Mother        >age60  . Arthritis Mother   . Cancer Mother   . Heart disease Mother   . Hyperlipidemia Mother   . Hypertension Mother   . Heart attack Father   . Heart disease Father   . Diabetes Maternal Aunt   . Hyperlipidemia Daughter   . Hypertension Daughter   . Liver disease Neg Hx     Social History   Socioeconomic History  . Marital status:  Widowed    Spouse name: Not on file  . Number of children: 2  . Years of education: Not on file  . Highest education level: Not on file  Occupational History  . Occupation: Psychologist, occupational at [employer]  . 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 Financial resource strain: Not on file  . Food insecurity    Worry: Not on file    Inability: Not on file  . Transportation needs    Medical: Not on file    Non-medical: Not on file  Tobacco Use  . Smoking status: Never Smoker  . Smokeless tobacco: Never Used  Substance and Sexual Activity  . Alcohol use: No    Alcohol/week: 0.0 standard drinks  . Drug use: No  . Sexual activity: Not Currently  Lifestyle  . Physical activity    Days per week: Not on file    Minutes per session: Not on file  . Stress: Not on file  Relationships  . Social Herbalist on phone: Not on file    Gets together: Not on file    Attends religious service: Not on file    Active member of club or organization: Not on file    Attends meetings of clubs or organizations: Not on file    Relationship status: Not on file  . Intimate partner violence    Fear of current or ex partner: Not on file    Emotionally abused: Not on file    Physically abused: Not on file    Forced sexual activity: Not on file  Other Topics Concern  . Not on file  Social History Narrative  . Not on file    Review of Systems: Gen: Denies any fever, chills, fatigue, weight loss, lack of appetite.  CV: Denies chest pain, heart palpitations, peripheral edema, syncope.  Resp: Denies shortness of breath at rest or with exertion. Denies wheezing or cough.  GI: see HPI GU : Denies urinary burning, urinary frequency, urinary hesitancy MS: see HPI Derm: Denies rash, itching, dry skin Psych: Denies depression, anxiety, memory loss, and confusion Heme: Denies bruising, bleeding, and enlarged lymph nodes.  Physical Exam: BP 137/79   Pulse 67   Temp (!) 97 F  (36.1 C) (Oral)   Ht 5\' 3"  (1.6 m)   Wt 155 lb 12.8 oz (70.7 kg)   BMI 27.60 kg/m  General:   Alert and oriented. Pleasant and cooperative. Well-nourished and well-developed.  Head:  Normocephalic and atraumatic. Lungs:  Clear to auscultation bilaterally. No wheezes, rales, or rhonchi. No distress.  Heart:  S1, S2 present without murmurs appreciated.  Abdomen:  +BS, soft,  non-tender and non-distended. No HSM noted. No guarding or rebound. No masses appreciated.  Rectal:  Deferred  Msk:  With kyphosis  Neurologic:  Alert and  oriented x4 Psych:  Alert and cooperative. Normal mood and affect.

## 2018-12-27 ENCOUNTER — Ambulatory Visit (HOSPITAL_COMMUNITY)
Admission: RE | Admit: 2018-12-27 | Discharge: 2018-12-27 | Disposition: A | Discharge status: Home / Self Care | Payer: Medicare Other | Source: Ambulatory Visit | Attending: Family Medicine | Admitting: Family Medicine

## 2018-12-27 DIAGNOSIS — Z8673 Personal history of transient ischemic attack (TIA), and cerebral infarction without residual deficits: Secondary | ICD-10-CM | POA: Diagnosis not present

## 2018-12-27 DIAGNOSIS — M170 Bilateral primary osteoarthritis of knee: Secondary | ICD-10-CM | POA: Diagnosis not present

## 2018-12-27 DIAGNOSIS — I10 Essential (primary) hypertension: Secondary | ICD-10-CM | POA: Diagnosis not present

## 2018-12-27 DIAGNOSIS — I618 Other nontraumatic intracerebral hemorrhage: Secondary | ICD-10-CM

## 2018-12-27 DIAGNOSIS — E785 Hyperlipidemia, unspecified: Secondary | ICD-10-CM | POA: Diagnosis not present

## 2018-12-27 DIAGNOSIS — G459 Transient cerebral ischemic attack, unspecified: Secondary | ICD-10-CM | POA: Diagnosis not present

## 2018-12-27 DIAGNOSIS — Z85038 Personal history of other malignant neoplasm of large intestine: Secondary | ICD-10-CM | POA: Diagnosis not present

## 2018-12-27 DIAGNOSIS — R531 Weakness: Secondary | ICD-10-CM | POA: Diagnosis not present

## 2018-12-27 DIAGNOSIS — Z86711 Personal history of pulmonary embolism: Secondary | ICD-10-CM | POA: Diagnosis not present

## 2018-12-27 DIAGNOSIS — I129 Hypertensive chronic kidney disease with stage 1 through stage 4 chronic kidney disease, or unspecified chronic kidney disease: Secondary | ICD-10-CM | POA: Diagnosis not present

## 2018-12-27 DIAGNOSIS — I619 Nontraumatic intracerebral hemorrhage, unspecified: Secondary | ICD-10-CM | POA: Diagnosis not present

## 2018-12-27 DIAGNOSIS — Z9181 History of falling: Secondary | ICD-10-CM | POA: Diagnosis not present

## 2018-12-27 DIAGNOSIS — E039 Hypothyroidism, unspecified: Secondary | ICD-10-CM | POA: Diagnosis not present

## 2018-12-28 DIAGNOSIS — G459 Transient cerebral ischemic attack, unspecified: Secondary | ICD-10-CM | POA: Diagnosis not present

## 2018-12-28 DIAGNOSIS — R531 Weakness: Secondary | ICD-10-CM | POA: Diagnosis not present

## 2018-12-28 DIAGNOSIS — Z86711 Personal history of pulmonary embolism: Secondary | ICD-10-CM | POA: Diagnosis not present

## 2018-12-28 DIAGNOSIS — I129 Hypertensive chronic kidney disease with stage 1 through stage 4 chronic kidney disease, or unspecified chronic kidney disease: Secondary | ICD-10-CM | POA: Diagnosis not present

## 2018-12-28 DIAGNOSIS — Z8673 Personal history of transient ischemic attack (TIA), and cerebral infarction without residual deficits: Secondary | ICD-10-CM | POA: Diagnosis not present

## 2018-12-28 DIAGNOSIS — Z9181 History of falling: Secondary | ICD-10-CM | POA: Diagnosis not present

## 2018-12-28 DIAGNOSIS — E039 Hypothyroidism, unspecified: Secondary | ICD-10-CM | POA: Diagnosis not present

## 2018-12-28 DIAGNOSIS — M170 Bilateral primary osteoarthritis of knee: Secondary | ICD-10-CM | POA: Diagnosis not present

## 2018-12-28 DIAGNOSIS — Z85038 Personal history of other malignant neoplasm of large intestine: Secondary | ICD-10-CM | POA: Diagnosis not present

## 2018-12-28 DIAGNOSIS — E785 Hyperlipidemia, unspecified: Secondary | ICD-10-CM | POA: Diagnosis not present

## 2018-12-30 NOTE — Assessment & Plan Note (Signed)
Very pleasant 84 year old female with history of colon cancer in 2011, s/p right hemicolectomy, due for surveillance now if health permits. After discussion with patient and daughter, review of medical records, the benefits do not outweigh the risks at this time. Recently with TIA and continued evaluation as outpatient. Both patient and daughter do not desire endoscopic evaluation unless urgent or emergent reasons, which is appropriate in this setting. We discussed signs/symptoms to report, and she will return prn.

## 2018-12-31 DIAGNOSIS — I129 Hypertensive chronic kidney disease with stage 1 through stage 4 chronic kidney disease, or unspecified chronic kidney disease: Secondary | ICD-10-CM | POA: Diagnosis not present

## 2018-12-31 DIAGNOSIS — E039 Hypothyroidism, unspecified: Secondary | ICD-10-CM | POA: Diagnosis not present

## 2018-12-31 DIAGNOSIS — M170 Bilateral primary osteoarthritis of knee: Secondary | ICD-10-CM | POA: Diagnosis not present

## 2018-12-31 DIAGNOSIS — Z9181 History of falling: Secondary | ICD-10-CM | POA: Diagnosis not present

## 2018-12-31 DIAGNOSIS — E785 Hyperlipidemia, unspecified: Secondary | ICD-10-CM | POA: Diagnosis not present

## 2018-12-31 DIAGNOSIS — R531 Weakness: Secondary | ICD-10-CM | POA: Diagnosis not present

## 2018-12-31 DIAGNOSIS — Z85038 Personal history of other malignant neoplasm of large intestine: Secondary | ICD-10-CM | POA: Diagnosis not present

## 2018-12-31 DIAGNOSIS — Z86711 Personal history of pulmonary embolism: Secondary | ICD-10-CM | POA: Diagnosis not present

## 2018-12-31 DIAGNOSIS — G459 Transient cerebral ischemic attack, unspecified: Secondary | ICD-10-CM | POA: Diagnosis not present

## 2018-12-31 DIAGNOSIS — Z8673 Personal history of transient ischemic attack (TIA), and cerebral infarction without residual deficits: Secondary | ICD-10-CM | POA: Diagnosis not present

## 2019-01-02 DIAGNOSIS — Z86711 Personal history of pulmonary embolism: Secondary | ICD-10-CM | POA: Diagnosis not present

## 2019-01-02 DIAGNOSIS — Z85038 Personal history of other malignant neoplasm of large intestine: Secondary | ICD-10-CM | POA: Diagnosis not present

## 2019-01-02 DIAGNOSIS — R531 Weakness: Secondary | ICD-10-CM | POA: Diagnosis not present

## 2019-01-02 DIAGNOSIS — M170 Bilateral primary osteoarthritis of knee: Secondary | ICD-10-CM | POA: Diagnosis not present

## 2019-01-02 DIAGNOSIS — E785 Hyperlipidemia, unspecified: Secondary | ICD-10-CM | POA: Diagnosis not present

## 2019-01-02 DIAGNOSIS — Z9181 History of falling: Secondary | ICD-10-CM | POA: Diagnosis not present

## 2019-01-02 DIAGNOSIS — G459 Transient cerebral ischemic attack, unspecified: Secondary | ICD-10-CM | POA: Diagnosis not present

## 2019-01-02 DIAGNOSIS — I129 Hypertensive chronic kidney disease with stage 1 through stage 4 chronic kidney disease, or unspecified chronic kidney disease: Secondary | ICD-10-CM | POA: Diagnosis not present

## 2019-01-02 DIAGNOSIS — Z8673 Personal history of transient ischemic attack (TIA), and cerebral infarction without residual deficits: Secondary | ICD-10-CM | POA: Diagnosis not present

## 2019-01-02 DIAGNOSIS — E039 Hypothyroidism, unspecified: Secondary | ICD-10-CM | POA: Diagnosis not present

## 2019-01-03 ENCOUNTER — Telehealth: Payer: Self-pay | Admitting: *Deleted

## 2019-01-03 DIAGNOSIS — Z85038 Personal history of other malignant neoplasm of large intestine: Secondary | ICD-10-CM | POA: Diagnosis not present

## 2019-01-03 DIAGNOSIS — Z9181 History of falling: Secondary | ICD-10-CM | POA: Diagnosis not present

## 2019-01-03 DIAGNOSIS — G459 Transient cerebral ischemic attack, unspecified: Secondary | ICD-10-CM | POA: Diagnosis not present

## 2019-01-03 DIAGNOSIS — E785 Hyperlipidemia, unspecified: Secondary | ICD-10-CM | POA: Diagnosis not present

## 2019-01-03 DIAGNOSIS — Z8673 Personal history of transient ischemic attack (TIA), and cerebral infarction without residual deficits: Secondary | ICD-10-CM | POA: Diagnosis not present

## 2019-01-03 DIAGNOSIS — R531 Weakness: Secondary | ICD-10-CM | POA: Diagnosis not present

## 2019-01-03 DIAGNOSIS — E039 Hypothyroidism, unspecified: Secondary | ICD-10-CM | POA: Diagnosis not present

## 2019-01-03 DIAGNOSIS — M170 Bilateral primary osteoarthritis of knee: Secondary | ICD-10-CM | POA: Diagnosis not present

## 2019-01-03 DIAGNOSIS — Z86711 Personal history of pulmonary embolism: Secondary | ICD-10-CM | POA: Diagnosis not present

## 2019-01-03 DIAGNOSIS — I129 Hypertensive chronic kidney disease with stage 1 through stage 4 chronic kidney disease, or unspecified chronic kidney disease: Secondary | ICD-10-CM | POA: Diagnosis not present

## 2019-01-03 NOTE — Telephone Encounter (Signed)
Received call from patient daughter, Loletha Carrow.   Reports that pt BP noted at 120/60. Inquired as to if this is too low.   Advised that BP range for her age group should be 120- 140/ 60- 90. Advised that low BP is considered <90/60. If low BP noted, give 8oz water to drink and call office for recommendations.   States that patient takes Amlodipine at bedtime. Reports that they are not checking her BP regularly, but she is under the care of OT who monitors her BP.

## 2019-01-04 ENCOUNTER — Other Ambulatory Visit: Payer: Self-pay | Admitting: *Deleted

## 2019-01-04 MED ORDER — ASCRIPTIN 325 MG PO TABS: 1.0000 | ORAL_TABLET | Freq: Every day | ORAL | 11 refills | Status: DC

## 2019-01-04 NOTE — Telephone Encounter (Signed)
noted 

## 2019-01-07 DIAGNOSIS — Z8673 Personal history of transient ischemic attack (TIA), and cerebral infarction without residual deficits: Secondary | ICD-10-CM | POA: Diagnosis not present

## 2019-01-07 DIAGNOSIS — E785 Hyperlipidemia, unspecified: Secondary | ICD-10-CM | POA: Diagnosis not present

## 2019-01-07 DIAGNOSIS — G459 Transient cerebral ischemic attack, unspecified: Secondary | ICD-10-CM | POA: Diagnosis not present

## 2019-01-07 DIAGNOSIS — I129 Hypertensive chronic kidney disease with stage 1 through stage 4 chronic kidney disease, or unspecified chronic kidney disease: Secondary | ICD-10-CM | POA: Diagnosis not present

## 2019-01-07 DIAGNOSIS — Z86711 Personal history of pulmonary embolism: Secondary | ICD-10-CM | POA: Diagnosis not present

## 2019-01-07 DIAGNOSIS — R531 Weakness: Secondary | ICD-10-CM | POA: Diagnosis not present

## 2019-01-07 DIAGNOSIS — Z9181 History of falling: Secondary | ICD-10-CM | POA: Diagnosis not present

## 2019-01-07 DIAGNOSIS — E039 Hypothyroidism, unspecified: Secondary | ICD-10-CM | POA: Diagnosis not present

## 2019-01-07 DIAGNOSIS — Z85038 Personal history of other malignant neoplasm of large intestine: Secondary | ICD-10-CM | POA: Diagnosis not present

## 2019-01-07 DIAGNOSIS — M170 Bilateral primary osteoarthritis of knee: Secondary | ICD-10-CM | POA: Diagnosis not present

## 2019-01-09 DIAGNOSIS — E785 Hyperlipidemia, unspecified: Secondary | ICD-10-CM | POA: Diagnosis not present

## 2019-01-09 DIAGNOSIS — R531 Weakness: Secondary | ICD-10-CM | POA: Diagnosis not present

## 2019-01-09 DIAGNOSIS — I129 Hypertensive chronic kidney disease with stage 1 through stage 4 chronic kidney disease, or unspecified chronic kidney disease: Secondary | ICD-10-CM | POA: Diagnosis not present

## 2019-01-09 DIAGNOSIS — Z9181 History of falling: Secondary | ICD-10-CM | POA: Diagnosis not present

## 2019-01-09 DIAGNOSIS — Z86711 Personal history of pulmonary embolism: Secondary | ICD-10-CM | POA: Diagnosis not present

## 2019-01-09 DIAGNOSIS — G459 Transient cerebral ischemic attack, unspecified: Secondary | ICD-10-CM | POA: Diagnosis not present

## 2019-01-09 DIAGNOSIS — M170 Bilateral primary osteoarthritis of knee: Secondary | ICD-10-CM | POA: Diagnosis not present

## 2019-01-09 DIAGNOSIS — Z8673 Personal history of transient ischemic attack (TIA), and cerebral infarction without residual deficits: Secondary | ICD-10-CM | POA: Diagnosis not present

## 2019-01-09 DIAGNOSIS — Z85038 Personal history of other malignant neoplasm of large intestine: Secondary | ICD-10-CM | POA: Diagnosis not present

## 2019-01-09 DIAGNOSIS — E039 Hypothyroidism, unspecified: Secondary | ICD-10-CM | POA: Diagnosis not present

## 2019-01-10 DIAGNOSIS — M542 Cervicalgia: Secondary | ICD-10-CM | POA: Diagnosis not present

## 2019-01-10 DIAGNOSIS — G890 Central pain syndrome: Secondary | ICD-10-CM | POA: Diagnosis not present

## 2019-01-10 DIAGNOSIS — G4701 Insomnia due to medical condition: Secondary | ICD-10-CM | POA: Diagnosis not present

## 2019-01-10 DIAGNOSIS — R5383 Other fatigue: Secondary | ICD-10-CM | POA: Diagnosis not present

## 2019-01-11 DIAGNOSIS — Z9181 History of falling: Secondary | ICD-10-CM | POA: Diagnosis not present

## 2019-01-11 DIAGNOSIS — I129 Hypertensive chronic kidney disease with stage 1 through stage 4 chronic kidney disease, or unspecified chronic kidney disease: Secondary | ICD-10-CM | POA: Diagnosis not present

## 2019-01-11 DIAGNOSIS — E039 Hypothyroidism, unspecified: Secondary | ICD-10-CM | POA: Diagnosis not present

## 2019-01-11 DIAGNOSIS — Z85038 Personal history of other malignant neoplasm of large intestine: Secondary | ICD-10-CM | POA: Diagnosis not present

## 2019-01-11 DIAGNOSIS — E785 Hyperlipidemia, unspecified: Secondary | ICD-10-CM | POA: Diagnosis not present

## 2019-01-11 DIAGNOSIS — Z8673 Personal history of transient ischemic attack (TIA), and cerebral infarction without residual deficits: Secondary | ICD-10-CM | POA: Diagnosis not present

## 2019-01-11 DIAGNOSIS — Z86711 Personal history of pulmonary embolism: Secondary | ICD-10-CM | POA: Diagnosis not present

## 2019-01-11 DIAGNOSIS — G459 Transient cerebral ischemic attack, unspecified: Secondary | ICD-10-CM | POA: Diagnosis not present

## 2019-01-11 DIAGNOSIS — M170 Bilateral primary osteoarthritis of knee: Secondary | ICD-10-CM | POA: Diagnosis not present

## 2019-01-11 DIAGNOSIS — R531 Weakness: Secondary | ICD-10-CM | POA: Diagnosis not present

## 2019-01-16 DIAGNOSIS — M170 Bilateral primary osteoarthritis of knee: Secondary | ICD-10-CM | POA: Diagnosis not present

## 2019-01-16 DIAGNOSIS — Z85038 Personal history of other malignant neoplasm of large intestine: Secondary | ICD-10-CM | POA: Diagnosis not present

## 2019-01-16 DIAGNOSIS — Z9181 History of falling: Secondary | ICD-10-CM | POA: Diagnosis not present

## 2019-01-16 DIAGNOSIS — G459 Transient cerebral ischemic attack, unspecified: Secondary | ICD-10-CM | POA: Diagnosis not present

## 2019-01-16 DIAGNOSIS — Z86711 Personal history of pulmonary embolism: Secondary | ICD-10-CM | POA: Diagnosis not present

## 2019-01-16 DIAGNOSIS — R531 Weakness: Secondary | ICD-10-CM | POA: Diagnosis not present

## 2019-01-16 DIAGNOSIS — I129 Hypertensive chronic kidney disease with stage 1 through stage 4 chronic kidney disease, or unspecified chronic kidney disease: Secondary | ICD-10-CM | POA: Diagnosis not present

## 2019-01-16 DIAGNOSIS — Z8673 Personal history of transient ischemic attack (TIA), and cerebral infarction without residual deficits: Secondary | ICD-10-CM | POA: Diagnosis not present

## 2019-01-16 DIAGNOSIS — E785 Hyperlipidemia, unspecified: Secondary | ICD-10-CM | POA: Diagnosis not present

## 2019-01-16 DIAGNOSIS — E039 Hypothyroidism, unspecified: Secondary | ICD-10-CM | POA: Diagnosis not present

## 2019-01-29 ENCOUNTER — Telehealth: Payer: Self-pay

## 2019-01-29 DIAGNOSIS — M170 Bilateral primary osteoarthritis of knee: Secondary | ICD-10-CM | POA: Diagnosis not present

## 2019-01-29 DIAGNOSIS — E039 Hypothyroidism, unspecified: Secondary | ICD-10-CM | POA: Diagnosis not present

## 2019-01-29 DIAGNOSIS — R531 Weakness: Secondary | ICD-10-CM | POA: Diagnosis not present

## 2019-01-29 DIAGNOSIS — I129 Hypertensive chronic kidney disease with stage 1 through stage 4 chronic kidney disease, or unspecified chronic kidney disease: Secondary | ICD-10-CM | POA: Diagnosis not present

## 2019-01-29 DIAGNOSIS — Z9181 History of falling: Secondary | ICD-10-CM | POA: Diagnosis not present

## 2019-01-29 DIAGNOSIS — G459 Transient cerebral ischemic attack, unspecified: Secondary | ICD-10-CM | POA: Diagnosis not present

## 2019-01-29 DIAGNOSIS — Z85038 Personal history of other malignant neoplasm of large intestine: Secondary | ICD-10-CM | POA: Diagnosis not present

## 2019-01-29 DIAGNOSIS — E785 Hyperlipidemia, unspecified: Secondary | ICD-10-CM | POA: Diagnosis not present

## 2019-01-29 DIAGNOSIS — Z8673 Personal history of transient ischemic attack (TIA), and cerebral infarction without residual deficits: Secondary | ICD-10-CM | POA: Diagnosis not present

## 2019-01-29 DIAGNOSIS — Z86711 Personal history of pulmonary embolism: Secondary | ICD-10-CM | POA: Diagnosis not present

## 2019-01-29 NOTE — Telephone Encounter (Signed)
Kendal (RN) from [facility] called to get a note from pt's PCP for post office. Pt's post office needs a note stating that it is unsafe for pt to go check the mail and pt needs her mail delivered to her front door. Please advise.

## 2019-01-29 NOTE — Telephone Encounter (Signed)
Okay to to write a letter. I will sign

## 2019-02-05 ENCOUNTER — Other Ambulatory Visit: Payer: Self-pay

## 2019-02-05 ENCOUNTER — Inpatient Hospital Stay (HOSPITAL_COMMUNITY): Payer: Medicare Other | Attending: Hematology

## 2019-02-05 ENCOUNTER — Other Ambulatory Visit (HOSPITAL_COMMUNITY): Payer: Self-pay | Admitting: Nurse Practitioner

## 2019-02-05 DIAGNOSIS — I2699 Other pulmonary embolism without acute cor pulmonale: Secondary | ICD-10-CM | POA: Diagnosis not present

## 2019-02-05 DIAGNOSIS — D6862 Lupus anticoagulant syndrome: Secondary | ICD-10-CM | POA: Insufficient documentation

## 2019-02-05 LAB — CBC WITH DIFFERENTIAL/PLATELET
Abs Immature Granulocytes: 0.01 10*3/uL (ref 0.00–0.07)
Basophils Absolute: 0 10*3/uL (ref 0.0–0.1)
Basophils Relative: 1 %
Eosinophils Absolute: 0.2 10*3/uL (ref 0.0–0.5)
Eosinophils Relative: 4 %
HCT: 42.7 % (ref 36.0–46.0)
Hemoglobin: 13.2 g/dL (ref 12.0–15.0)
Immature Granulocytes: 0 %
Lymphocytes Relative: 38 %
Lymphs Abs: 2.1 10*3/uL (ref 0.7–4.0)
MCH: 31.9 pg (ref 26.0–34.0)
MCHC: 30.9 g/dL (ref 30.0–36.0)
MCV: 103.1 fL — ABNORMAL HIGH (ref 80.0–100.0)
Monocytes Absolute: 0.6 10*3/uL (ref 0.1–1.0)
Monocytes Relative: 10 %
Neutro Abs: 2.7 10*3/uL (ref 1.7–7.7)
Neutrophils Relative %: 47 %
Platelets: 278 10*3/uL (ref 150–400)
RBC: 4.14 MIL/uL (ref 3.87–5.11)
RDW: 14.6 % (ref 11.5–15.5)
WBC: 5.7 10*3/uL (ref 4.0–10.5)
nRBC: 0 % (ref 0.0–0.2)

## 2019-02-05 LAB — COMPREHENSIVE METABOLIC PANEL
ALT: 24 U/L (ref 0–44)
AST: 21 U/L (ref 15–41)
Albumin: 4 g/dL (ref 3.5–5.0)
Alkaline Phosphatase: 97 U/L (ref 38–126)
Anion gap: 8 (ref 5–15)
BUN: 21 mg/dL (ref 8–23)
CO2: 27 mmol/L (ref 22–32)
Calcium: 9 mg/dL (ref 8.9–10.3)
Chloride: 104 mmol/L (ref 98–111)
Creatinine, Ser: 1.45 mg/dL — ABNORMAL HIGH (ref 0.44–1.00)
GFR calc Af Amer: 39 mL/min — ABNORMAL LOW (ref >60)
GFR calc non Af Amer: 33 mL/min — ABNORMAL LOW (ref >60)
Glucose, Bld: 102 mg/dL — ABNORMAL HIGH (ref 70–99)
Potassium: 4.2 mmol/L (ref 3.5–5.1)
Sodium: 139 mmol/L (ref 135–145)
Total Bilirubin: 0.7 mg/dL (ref 0.3–1.2)
Total Protein: 7.7 g/dL (ref 6.5–8.1)

## 2019-02-05 LAB — D-DIMER, QUANTITATIVE: D-Dimer, Quant: 1.21 ug/mL-FEU — ABNORMAL HIGH (ref 0.00–0.50)

## 2019-02-12 ENCOUNTER — Inpatient Hospital Stay (HOSPITAL_COMMUNITY): Payer: Medicare Other | Attending: Nurse Practitioner | Admitting: Nurse Practitioner

## 2019-02-12 ENCOUNTER — Other Ambulatory Visit: Payer: Self-pay

## 2019-02-12 DIAGNOSIS — Z8673 Personal history of transient ischemic attack (TIA), and cerebral infarction without residual deficits: Secondary | ICD-10-CM

## 2019-02-12 DIAGNOSIS — Z86711 Personal history of pulmonary embolism: Secondary | ICD-10-CM | POA: Diagnosis not present

## 2019-02-12 NOTE — Assessment & Plan Note (Signed)
1.  Unprovoked pulmonary embolism: - CT PE protocol on 04/26/2018 showed bilateral pulmonary embolisms with small right pleural effusions. -Patient was started on Eliquis and is tolerating well. -Hypercoagulable work-up included factor V Leiden and prothrombin gene mutation were negative.  Lupus anticoagulant was positive while patient was on Eliquis.  Beta-2 glycoprotein 1 antibodies were negative. -No evidence of occult malignancy on CT scan of the abdomen and pelvis. - We will assess the risk-benefit ratio of continuing anticoagulation.  Other alternatives is to change her to low-density anticoagulation with Eliquis 2.5 mg twice daily. -Patient was hospitalized in September for an intracranial hemorrhage.  She was taken off of her Eliquis.  She was placed on aspirin 325 mg daily. -She denies any lower leg edema swelling or redness.  Denies any shortness of breath. -Labs done on 02/05/2019 showed potassium 4.2, creatinine 1.45, WBC 5.7, hemoglobin 13.2, platelets 278, D-dimer 1.21. -We will see her back in 2 months with repeat labs.  2.  Colon cancer: -Cecal polypectomy on 06/29/2009 with adenocarcinoma arising in a tubular adenoma. -Right colon segmental resection with 0 out of 25 lymph nodes positive and no residual cancer. -Last colonoscopy was on 08/02/2013 showed hyperplastic polyp in the rectum and sigmoid colon. -CT of abdomen pelvis on 05/28/2018 did not show any evidence of malignancy.  She did not have any significant weight loss. -She will continue to have routine colonoscopies with GI.

## 2019-02-12 NOTE — Progress Notes (Signed)
[name], [name] [id number]   CLINIC:  Medical Oncology/Hematology  PCP:  Alycia Rossetti, MD [address] [place] [address] [phone]   REASON FOR VISIT: Follow-up for unprovoked mini embolism.  CURRENT THERAPY: Eliquis was discontinued due to a hemorrhagic stroke now is on aspirin 325 daily   CANCER STAGING: Cancer Staging Colon cancer [facility]) Staging form: Colon and Rectum, AJCC 7th Edition - Clinical: Stage I (T1, N0, M0) - Unsigned    INTERVAL HISTORY:  [name] 84 y.o. female was called for a telephone visit for unprovoked pulmonary embolism.  Patient reports she has been doing well since she was hospitalized for her hemorrhagic stroke back in September.  She reports her neurologist took her off her Eliquis and placed her on aspirin.  Patient denies any lower leg edema or redness. Denies any nausea, vomiting, or diarrhea. Denies any new pains. Had not noticed any recent bleeding such as epistaxis, hematuria or hematochezia. Denies recent chest pain on exertion, shortness of breath on minimal exertion, pre-syncopal episodes, or palpitations. Denies any numbness or tingling in hands or feet. Denies any recent fevers, infections, or recent hospitalizations. Patient reports appetite at 50% and energy level at 50%.  She is eating well maintaining her weight this time.     REVIEW OF SYSTEMS:  Review of Systems  Constitutional: Positive for fatigue.  All other systems reviewed and are negative.    PAST MEDICAL/SURGICAL HISTORY:  Past Medical History:  Diagnosis Date  . Allergy   . Arthritis   . Colon cancer ([place])    colon ca dx 07/30/09  . History of cardiac monitoring 07/2017   "Event monitor demonstrated sinus rhythm with isolated PACs and no arrhythmias"  . History of colon cancer 06/2009   found at time of TCS 06/29/09, 1.2cm sessile cecal polyp, no adjuvent therapy needed  . HTN (hypertension)   . Hx of  cardiovascular stress test 07/2017   "No diagnostic ST segment changes to indicate ischemia. Small, moderate intensity, reversible apical to basal inferolateral defect consistent with ischemia. This is a low risk study. Nuclear stress EF: 84%."  . Hyperlipidemia   . Hypothyroidism   . PE (pulmonary thromboembolism) ([place])   . Renal disorder    cyst on kidney   . Stroke ([place])   . Vertigo    Past Surgical History:  Procedure Laterality Date  . ABDOMINAL HYSTERECTOMY    . COLON SURGERY  07/2009   right hemicolectomy, no residual colon cancer on path  . COLONOSCOPY  07/16/2010   [id number] POLYP-TCS 3 YEARS  . COLONOSCOPY N/A 08/02/2013   hyperplastic polyps, surveillance in 2020 if benefits outweight the risks  . COLONOSCOPY  06/2009   1.2 cm sessile cecal polyp which had adenocarcinoma arising in a tubular adenoma.  Marland Kitchen PARTIAL THYMECTOMY    . partial thyroidectomy     benign tumors     SOCIAL HISTORY:  Social History   Socioeconomic History  . Marital status: Widowed    Spouse name: Not on file  . Number of children: 2  . Years of education: Not on file  . Highest education level: Not on file  Occupational History  . Occupation: Psychologist, occupational at [employer]  . 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 Financial resource strain: Not on file  . Food insecurity    Worry: Not on file    Inability: Not on file  .  Transportation needs    Medical: Not on file    Non-medical: Not on file  Tobacco Use  . Smoking status: Never Smoker  . Smokeless tobacco: Never Used  Substance and Sexual Activity  . Alcohol use: No    Alcohol/week: 0.0 standard drinks  . Drug use: No  . Sexual activity: Not Currently  Lifestyle  . Physical activity    Days per week: Not on file    Minutes per session: Not on file  . Stress: Not on file  Relationships  . Social Herbalist on phone: Not on file    Gets together: Not on file    Attends religious service:  Not on file    Active member of club or organization: Not on file    Attends meetings of clubs or organizations: Not on file    Relationship status: Not on file  . Intimate partner violence    Fear of current or ex partner: Not on file    Emotionally abused: Not on file    Physically abused: Not on file    Forced sexual activity: Not on file  Other Topics Concern  . Not on file  Social History Narrative  . Not on file    FAMILY HISTORY:  Family History  Problem Relation Age of Onset  . Colon cancer Mother        >age60  . Arthritis Mother   . Cancer Mother   . Heart disease Mother   . Hyperlipidemia Mother   . Hypertension Mother   . Heart attack Father   . Heart disease Father   . Diabetes Maternal Aunt   . Hyperlipidemia Daughter   . Hypertension Daughter   . Liver disease Neg Hx     CURRENT MEDICATIONS:  Outpatient Encounter Medications as of 02/12/2019  Medication Sig  . acetaminophen (TYLENOL) 500 MG tablet Take 1,000 mg by mouth every 8 (eight) hours as needed (cold).  Marland Kitchen amLODipine (NORVASC) 5 MG tablet Take 1 tablet (5 mg total) by mouth daily.  . Aspirin Buf,AlHyd-MgHyd-CaCar, (ASCRIPTIN) 325 MG TABS Take 1 tablet by mouth daily.  Marland Kitchen atorvastatin (LIPITOR) 40 MG tablet Take 2 tablets (80 mg total) by mouth daily.  . cholecalciferol (VITAMIN D3) 25 MCG (1000 UT) tablet Take 1,000 Units by mouth daily.   Marland Kitchen gabapentin (NEURONTIN) 300 MG capsule Take 1 in the morning and 1 in the evening (Patient taking differently: Take 300 mg by mouth 2 (two) times daily. )  . linaclotide (LINZESS) 72 MCG capsule TAKE 1 CAPSULE BY MOUTH DAILY BEFORE BREAKFAST. (Patient taking differently: Take 72 mcg by mouth daily as needed (constipation). )  . metoprolol tartrate (LOPRESSOR) 25 MG tablet Take 1.5 tablets (37.5 mg total) by mouth 2 (two) times daily.  . RESTASIS 0.05 % ophthalmic emulsion Place 1 drop into both eyes 2 (two) times daily as needed (chronic dry eye).    No  facility-administered encounter medications on file as of 02/12/2019.     ALLERGIES:  No Known Allergies  Vital signs: -Deferred due to telephone visit   Physical Exam -Deferred due to telephone visit -Patient was alert and oriented on the phone and in no acute distress.  LABORATORY DATA:  I have reviewed the labs as listed.  CBC    Component Value Date/Time   WBC 5.7 02/05/2019 1541   RBC 4.14 02/05/2019 1541   HGB 13.2 02/05/2019 1541   HGB 14.7 04/16/2015 0812   HCT 42.7 02/05/2019 1541  HCT 44.2 04/16/2015 0812   PLT 278 02/05/2019 1541   PLT 332 04/16/2015 0812   MCV 103.1 (H) 02/05/2019 1541   MCV 93.8 04/16/2015 0812   MCH 31.9 02/05/2019 1541   MCHC 30.9 02/05/2019 1541   RDW 14.6 02/05/2019 1541   RDW 15.0 (H) 04/16/2015 0812   LYMPHSABS 2.1 02/05/2019 1541   LYMPHSABS 1.9 04/16/2015 0812   MONOABS 0.6 02/05/2019 1541   MONOABS 0.4 04/16/2015 0812   EOSABS 0.2 02/05/2019 1541   EOSABS 0.2 04/16/2015 0812   BASOSABS 0.0 02/05/2019 1541   BASOSABS 0.1 04/16/2015 0812   CMP Latest Ref Rng & Units 02/05/2019 12/05/2018 11/27/2018  Glucose 70 - 99 mg/dL 102(H) 101(H) 135(H)  BUN 8 - 23 mg/dL 21 19 25(H)  Creatinine 0.44 - 1.00 mg/dL 1.45(H) 1.50(H) 1.71(H)  Sodium 135 - 145 mmol/L 139 142 141  Potassium 3.5 - 5.1 mmol/L 4.2 4.3 3.2(L)  Chloride 98 - 111 mmol/L 104 105 106  CO2 22 - 32 mmol/L 27 26 25   Calcium 8.9 - 10.3 mg/dL 9.0 9.7 9.4  Total Protein 6.5 - 8.1 g/dL 7.7 - -  Total Bilirubin 0.3 - 1.2 mg/dL 0.7 - -  Alkaline Phos 38 - 126 U/L 97 - -  AST 15 - 41 U/L 21 - -  ALT 0 - 44 U/L 24 - -    All questions were answered to patient's stated satisfaction. Encouraged patient to call with any new concerns or questions before his next visit to the cancer center and we can certain see him sooner, if needed.      ASSESSMENT & PLAN:   History of pulmonary embolism 1.  Unprovoked pulmonary embolism: - CT PE protocol on 04/26/2018 showed bilateral  pulmonary embolisms with small right pleural effusions. -Patient was started on Eliquis and is tolerating well. -Hypercoagulable work-up included factor V Leiden and prothrombin gene mutation were negative.  Lupus anticoagulant was positive while patient was on Eliquis.  Beta-2 glycoprotein 1 antibodies were negative. -No evidence of occult malignancy on CT scan of the abdomen and pelvis. - We will assess the risk-benefit ratio of continuing anticoagulation.  Other alternatives is to change her to low-density anticoagulation with Eliquis 2.5 mg twice daily. -Patient was hospitalized in September for an intracranial hemorrhage.  She was taken off of her Eliquis.  She was placed on aspirin 325 mg daily. -She denies any lower leg edema swelling or redness.  Denies any shortness of breath. -Labs done on 02/05/2019 showed potassium 4.2, creatinine 1.45, WBC 5.7, hemoglobin 13.2, platelets 278, D-dimer 1.21. -We will see her back in 2 months with repeat labs.  2.  Colon cancer: -Cecal polypectomy on 06/29/2009 with adenocarcinoma arising in a tubular adenoma. -Right colon segmental resection with 0 out of 25 lymph nodes positive and no residual cancer. -Last colonoscopy was on 08/02/2013 showed hyperplastic polyp in the rectum and sigmoid colon. -CT of abdomen pelvis on 05/28/2018 did not show any evidence of malignancy.  She did not have any significant weight loss. -She will continue to have routine colonoscopies with GI.      Orders placed this encounter:  Orders Placed This Encounter  Procedures  . CBC with Differential/Platelet  . Comprehensive metabolic panel  . D-dimer, quantitative      Francene Finders, FNP-C [place] [phone]

## 2019-02-28 ENCOUNTER — Other Ambulatory Visit: Payer: Self-pay

## 2019-02-28 ENCOUNTER — Observation Stay (HOSPITAL_COMMUNITY)
Admission: EM | Admit: 2019-02-28 | Discharge: 2019-03-02 | Disposition: A | Discharge status: Home / Self Care | Payer: Medicare Other | Attending: Internal Medicine | Admitting: Internal Medicine

## 2019-02-28 ENCOUNTER — Emergency Department (HOSPITAL_COMMUNITY): Payer: Medicare Other

## 2019-02-28 DIAGNOSIS — I129 Hypertensive chronic kidney disease with stage 1 through stage 4 chronic kidney disease, or unspecified chronic kidney disease: Secondary | ICD-10-CM | POA: Insufficient documentation

## 2019-02-28 DIAGNOSIS — N183 Chronic kidney disease, stage 3 unspecified: Secondary | ICD-10-CM | POA: Diagnosis not present

## 2019-02-28 DIAGNOSIS — G40209 Localization-related (focal) (partial) symptomatic epilepsy and epileptic syndromes with complex partial seizures, not intractable, without status epilepticus: Secondary | ICD-10-CM | POA: Diagnosis not present

## 2019-02-28 DIAGNOSIS — E1122 Type 2 diabetes mellitus with diabetic chronic kidney disease: Secondary | ICD-10-CM | POA: Insufficient documentation

## 2019-02-28 DIAGNOSIS — Z85038 Personal history of other malignant neoplasm of large intestine: Secondary | ICD-10-CM | POA: Insufficient documentation

## 2019-02-28 DIAGNOSIS — E785 Hyperlipidemia, unspecified: Secondary | ICD-10-CM | POA: Insufficient documentation

## 2019-02-28 DIAGNOSIS — R29818 Other symptoms and signs involving the nervous system: Secondary | ICD-10-CM | POA: Diagnosis not present

## 2019-02-28 DIAGNOSIS — M6281 Muscle weakness (generalized): Secondary | ICD-10-CM | POA: Diagnosis not present

## 2019-02-28 DIAGNOSIS — R209 Unspecified disturbances of skin sensation: Secondary | ICD-10-CM

## 2019-02-28 DIAGNOSIS — I69359 Hemiplegia and hemiparesis following cerebral infarction affecting unspecified side: Secondary | ICD-10-CM | POA: Insufficient documentation

## 2019-02-28 DIAGNOSIS — E039 Hypothyroidism, unspecified: Secondary | ICD-10-CM | POA: Insufficient documentation

## 2019-02-28 DIAGNOSIS — Z7984 Long term (current) use of oral hypoglycemic drugs: Secondary | ICD-10-CM | POA: Insufficient documentation

## 2019-02-28 DIAGNOSIS — Z79899 Other long term (current) drug therapy: Secondary | ICD-10-CM | POA: Diagnosis not present

## 2019-02-28 DIAGNOSIS — N1832 Chronic kidney disease, stage 3b: Secondary | ICD-10-CM | POA: Diagnosis present

## 2019-02-28 DIAGNOSIS — Z20828 Contact with and (suspected) exposure to other viral communicable diseases: Secondary | ICD-10-CM | POA: Insufficient documentation

## 2019-02-28 DIAGNOSIS — G459 Transient cerebral ischemic attack, unspecified: Secondary | ICD-10-CM | POA: Diagnosis present

## 2019-02-28 DIAGNOSIS — G8194 Hemiplegia, unspecified affecting left nondominant side: Secondary | ICD-10-CM

## 2019-02-28 DIAGNOSIS — N184 Chronic kidney disease, stage 4 (severe): Secondary | ICD-10-CM | POA: Diagnosis present

## 2019-02-28 DIAGNOSIS — E782 Mixed hyperlipidemia: Secondary | ICD-10-CM | POA: Diagnosis present

## 2019-02-28 DIAGNOSIS — R531 Weakness: Secondary | ICD-10-CM | POA: Diagnosis not present

## 2019-02-28 LAB — RAPID URINE DRUG SCREEN, HOSP PERFORMED
Amphetamines: NOT DETECTED
Barbiturates: NOT DETECTED
Benzodiazepines: NOT DETECTED
Cocaine: NOT DETECTED
Opiates: NOT DETECTED
Tetrahydrocannabinol: NOT DETECTED

## 2019-02-28 LAB — COMPREHENSIVE METABOLIC PANEL
ALT: 23 U/L (ref 0–44)
AST: 21 U/L (ref 15–41)
Albumin: 4.1 g/dL (ref 3.5–5.0)
Alkaline Phosphatase: 99 U/L (ref 38–126)
Anion gap: 10 (ref 5–15)
BUN: 22 mg/dL (ref 8–23)
CO2: 28 mmol/L (ref 22–32)
Calcium: 9.8 mg/dL (ref 8.9–10.3)
Chloride: 103 mmol/L (ref 98–111)
Creatinine, Ser: 1.3 mg/dL — ABNORMAL HIGH (ref 0.44–1.00)
GFR calc Af Amer: 44 mL/min — ABNORMAL LOW (ref >60)
GFR calc non Af Amer: 38 mL/min — ABNORMAL LOW (ref >60)
Glucose, Bld: 96 mg/dL (ref 70–99)
Potassium: 3.7 mmol/L (ref 3.5–5.1)
Sodium: 141 mmol/L (ref 135–145)
Total Bilirubin: 0.9 mg/dL (ref 0.3–1.2)
Total Protein: 7.9 g/dL (ref 6.5–8.1)

## 2019-02-28 LAB — URINALYSIS, ROUTINE W REFLEX MICROSCOPIC
Bacteria, UA: NONE SEEN
Bilirubin Urine: NEGATIVE
Glucose, UA: NEGATIVE mg/dL
Hgb urine dipstick: NEGATIVE
Ketones, ur: NEGATIVE mg/dL
Nitrite: NEGATIVE
Protein, ur: NEGATIVE mg/dL
Specific Gravity, Urine: 1.003 — ABNORMAL LOW (ref 1.005–1.030)
pH: 7 (ref 5.0–8.0)

## 2019-02-28 LAB — CBC
HCT: 43.2 % (ref 36.0–46.0)
Hemoglobin: 13.7 g/dL (ref 12.0–15.0)
MCH: 32.2 pg (ref 26.0–34.0)
MCHC: 31.7 g/dL (ref 30.0–36.0)
MCV: 101.4 fL — ABNORMAL HIGH (ref 80.0–100.0)
Platelets: 287 10*3/uL (ref 150–400)
RBC: 4.26 MIL/uL (ref 3.87–5.11)
RDW: 14.3 % (ref 11.5–15.5)
WBC: 5.8 10*3/uL (ref 4.0–10.5)
nRBC: 0 % (ref 0.0–0.2)

## 2019-02-28 LAB — DIFFERENTIAL
Abs Immature Granulocytes: 0.01 10*3/uL (ref 0.00–0.07)
Basophils Absolute: 0 10*3/uL (ref 0.0–0.1)
Basophils Relative: 1 %
Eosinophils Absolute: 0.2 10*3/uL (ref 0.0–0.5)
Eosinophils Relative: 4 %
Immature Granulocytes: 0 %
Lymphocytes Relative: 42 %
Lymphs Abs: 2.4 10*3/uL (ref 0.7–4.0)
Monocytes Absolute: 0.5 10*3/uL (ref 0.1–1.0)
Monocytes Relative: 9 %
Neutro Abs: 2.5 10*3/uL (ref 1.7–7.7)
Neutrophils Relative %: 44 %

## 2019-02-28 LAB — ETHANOL: Alcohol, Ethyl (B): <10 mg/dL (ref <10)

## 2019-02-28 LAB — PROTIME-INR
INR: 1 (ref 0.8–1.2)
Prothrombin Time: 13.2 seconds (ref 11.4–15.2)

## 2019-02-28 LAB — CBG MONITORING, ED: Glucose-Capillary: 89 mg/dL (ref 70–99)

## 2019-02-28 LAB — APTT: aPTT: 30 seconds (ref 24–36)

## 2019-02-28 NOTE — ED Triage Notes (Signed)
Pt c/o left sided weakness and numbness since 530 pm. States that she was normal at 3pm.

## 2019-02-28 NOTE — Consult Note (Signed)
[facility] Tele[facility]rology Consult Services   Date of Service:   02/28/2019 21:56:10  Impression:     .  I63.9 - Cerebrovascular accident (CVA), unspecified mechanism ([place])  Comments/Sign-Out: Patient with new left face and arm numbness. She is outside of the IV alteplase window and symptoms are not consistent with a large vessel oclussion. Recommend admission for further stroke work up.  Metrics: Last Known Well: 02/28/2019 15:00:00 [facility] Notification Time: 02/28/2019 21:56:10 Arrival Time: 02/28/2019 21:32:00 Stamp Time: 02/28/2019 21:56:10 Time First Login Attempt: 02/28/2019 22:00:27 Video Start Time: 02/28/2019 22:00:27  Symptoms: left sided numbness NIHSS Start Assessment Time: 02/28/2019 22:02:39 Patient is not a candidate for Alteplase/Activase. Video End Time: 02/28/2019 22:10:59  CT head showed no acute hemorrhage or acute core infarct.  Clinical Presentation is not Suggestive of Large Vessel Occlusive Disease  ED Physician notified of diagnostic impression and management plan on 02/28/2019 22:11:00  Our recommendations are outlined below.  Recommendations:     .  Activate Stroke Protocol Admission/Order Set     .  Stroke/Telemetry Floor     .  Neuro Checks     .  Bedside Swallow Eval     .  DVT Prophylaxis     .  IV Fluids, Normal Saline     .  Head of Bed 30 Degrees     .  Euglycemia and Avoid Hyperthermia (PRN Acetaminophen)     .  Initiate Aspirin 325 MG Daily  Routine Consultation with [facility] for Follow up Care  Sign Out:     .  Discussed with Emergency Department Provider    ------------------------------------------------------------------------------  History of Present Illness: Patient is a 84 year old Female.  Patient was brought by private transportation with symptoms of left sided numbness  84 yo F with history of PE in 2/20 not anticoagulated, cerebral microhemorrhage, Stroke/TIA, HTN and HL who  is presenting with left sided weakness 17:30, last normal at 15:00. She had a prior stroke and this is similar. She has some numbness on the left side of her face. She has been having headaches but doesn't have one now. She denies any recent   Past Medical History:     . Hypertension     . Hyperlipidemia  Anticoagulant use:  No  Antiplatelet use: aspirin 325mg     Examination: BP(168/85), Pulse(64), Blood Glucose(89) 1A: Level of Consciousness - Alert; keenly responsive + 0 1B: Ask Month and Age - Both Questions Right + 0 1C: Blink Eyes & Squeeze Hands - Performs Both Tasks + 0 2: Test Horizontal Extraocular Movements - Normal + 0 3: Test Visual Fields - No Visual Loss + 0 4: Test Facial Palsy (Use [place]ace if Obtunded) - Normal symmetry + 0 5A: Test Left Arm Motor Drift - No Drift for 10 Seconds + 0 5B: Test Right Arm Motor Drift - No Drift for 10 Seconds + 0 6A: Test Left Leg Motor Drift - No Drift for 5 Seconds + 0 6B: Test Right Leg Motor Drift - No Drift for 5 Seconds + 0 7: Test Limb Ataxia (FNF/Heel-Shin) - No Ataxia + 0 8: Test Sensation - Mild-Moderate Loss: Less Sharp/More Dull + 1 9: Test Language/Aphasia - Normal; No aphasia + 0 10: Test Dysarthria - Normal + 0 11: Test Extinction/Inattention - No abnormality + 0  NIHSS Score: 1  Pre-Morbid Modified Ranking Scale: 0 Points = No symptoms at all   Patient/Family was informed the Neurology Consult would happen via TeleHealth consult by way  of interactive audio and video telecommunications and consented to receiving care in this manner.   Due to the immediate potential for life-threatening deterioration due to underlying acute neurologic illness, I spent 20 minutes providing critical care. This time includes time for face to face visit via telemedicine, review of medical records, imaging studies and discussion of findings with providers, the patient and/or family.   Dr Carolin Sicks   [facility] [phone]   Case [id number]

## 2019-02-28 NOTE — ED Provider Notes (Signed)
[facility] EMERGENCY DEPARTMENT Provider Note   CSN: [id number] Arrival date & time: 02/28/19  2123  An emergency department physician performed an initial assessment on this suspected stroke patient at 2141.  History Chief Complaint  Patient presents with  . Code Stroke    [name] is a 84 y.o. female.  Patient states around 3:00 she started to have weakness in her left arm and left leg with numbness and left face.  Patient has an old stroke that gives her weakness on the left arm and left leg.  The history is provided by the patient. No language interpreter was used.  Weakness Severity:  Mild Onset quality:  Sudden Timing:  Intermittent Progression:  Improving Chronicity:  New Context: not alcohol use   Relieved by:  Nothing Worsened by:  Nothing Ineffective treatments:  None tried Associated symptoms: no abdominal pain, no chest pain, no cough, no diarrhea, no frequency, no headaches and no seizures        Past Medical History:  Diagnosis Date  . Allergy   . Arthritis   . Colon cancer ([place])    colon ca dx 07/30/09  . History of cardiac monitoring 07/2017   "Event monitor demonstrated sinus rhythm with isolated PACs and no arrhythmias"  . History of colon cancer 06/2009   found at time of TCS 06/29/09, 1.2cm sessile cecal polyp, no adjuvent therapy needed  . HTN (hypertension)   . Hx of cardiovascular stress test 07/2017   "No diagnostic ST segment changes to indicate ischemia. Small, moderate intensity, reversible apical to basal inferolateral defect consistent with ischemia. This is a low risk study. Nuclear stress EF: 84%."  . Hyperlipidemia   . Hypothyroidism   . PE (pulmonary thromboembolism) ([place])   . Renal disorder    cyst on kidney   . Stroke ([place])   . Vertigo     Patient Active Problem List   Diagnosis Date Noted  . TIA (transient ischemic attack) 11/25/2018  . Silent micro-hemorrhage of brain ([place]) 11/25/2018  . PVC (premature  ventricular contraction) 11/16/2018  . DDD (degenerative disc disease), cervical 08/16/2018  . Hyperlipemia 08/16/2018  . History of pulmonary embolism 04/26/2018  . Insomnia 12/15/2015  . Paresthesia of left upper and lower extremity 11/21/2015  . Left leg weakness 11/21/2015  . Paresthesias   . Essential hypertension 11/02/2015  . Hypothyroidism 11/02/2015  . Vertigo 11/02/2015  . CKD (chronic kidney disease), stage III ([place]) 11/02/2015  . Loss of weight 11/02/2015  . OA (osteoarthritis) of knee 11/02/2015  . Colon cancer ([place]) 02/28/2011  . History of colon cancer 06/29/2010  . Constipation 06/10/2009    Past Surgical History:  Procedure Laterality Date  . ABDOMINAL HYSTERECTOMY    . COLON SURGERY  07/2009   right hemicolectomy, no residual colon cancer on path  . COLONOSCOPY  07/16/2010   [id number] POLYP-TCS 3 YEARS  . COLONOSCOPY N/A 08/02/2013   hyperplastic polyps, surveillance in 2020 if benefits outweight the risks  . COLONOSCOPY  06/2009   1.2 cm sessile cecal polyp which had adenocarcinoma arising in a tubular adenoma.  Marland Kitchen PARTIAL THYMECTOMY    . partial thyroidectomy     benign tumors     OB History    Gravida  3   Para  2   Term  1   Preterm  1   AB  1   Living  2     SAB  1   TAB      Ectopic  Multiple      Live Births              Family History  Problem Relation Age of Onset  . Colon cancer Mother        >age60  . Arthritis Mother   . Cancer Mother   . Heart disease Mother   . Hyperlipidemia Mother   . Hypertension Mother   . Heart attack Father   . Heart disease Father   . Diabetes Maternal Aunt   . Hyperlipidemia Daughter   . Hypertension Daughter   . Liver disease Neg Hx     Social History   Tobacco Use  . Smoking status: Never Smoker  . Smokeless tobacco: Never Used  Substance Use Topics  . Alcohol use: No    Alcohol/week: 0.0 standard drinks  . Drug use: No    Home Medications Prior to Admission  medications   Medication Sig Start Date End Date Taking? Authorizing Provider  amLODipine (NORVASC) 5 MG tablet Take 1 tablet (5 mg total) by mouth daily. 12/05/18  Yes Eva, Modena Nunnery, MD  atorvastatin (LIPITOR) 40 MG tablet Take 2 tablets (80 mg total) by mouth daily. Patient taking differently: Take 40 mg by mouth daily.  08/22/18  Yes [place], Modena Nunnery, MD  linaclotide (LINZESS) 72 MCG capsule TAKE 1 CAPSULE BY MOUTH DAILY BEFORE BREAKFAST. Patient taking differently: Take 72 mcg by mouth daily as needed (constipation).  02/28/18  Yes [place], Modena Nunnery, MD  metoprolol tartrate (LOPRESSOR) 25 MG tablet Take 1.5 tablets (37.5 mg total) by mouth 2 (two) times daily. 11/13/18  Yes [place], [place] M, PA-C  traMADol (ULTRAM) 50 MG tablet Take 50 mg by mouth every 8 (eight) hours as needed. 01/10/19  Yes [provider]  acetaminophen (TYLENOL) 500 MG tablet Take 1,000 mg by mouth every 8 (eight) hours as needed (cold).    [provider]  Aspirin Buf,AlHyd-MgHyd-CaCar, (ASCRIPTIN) 325 MG TABS Take 1 tablet by mouth daily. 01/04/19   Alycia Rossetti, MD  cholecalciferol (VITAMIN D3) 25 MCG (1000 UT) tablet Take 1,000 Units by mouth daily.     [provider]  gabapentin (NEURONTIN) 300 MG capsule Take 1 in the morning and 1 in the evening Patient taking differently: Take 300 mg by mouth 2 (two) times daily.  10/26/18   [place], Modena Nunnery, MD  RESTASIS 0.05 % ophthalmic emulsion Place 1 drop into both eyes 2 (two) times daily as needed (chronic dry eye).  05/17/10   [provider]    Allergies    Patient has no known allergies.  Review of Systems   Review of Systems  Constitutional: Negative for appetite change and fatigue.  HENT: Negative for congestion, ear discharge and sinus pressure.   Eyes: Negative for discharge.  Respiratory: Negative for cough.   Cardiovascular: Negative for chest pain.  Gastrointestinal: Negative for abdominal pain and diarrhea.   Genitourinary: Negative for frequency and hematuria.  Musculoskeletal: Negative for back pain.  Skin: Negative for rash.  Neurological: Positive for weakness. Negative for seizures and headaches.  Psychiatric/Behavioral: Negative for hallucinations.    Physical Exam Updated Vital Signs BP (!) 142/71   Pulse 62   Temp 98.1 F (36.7 C)   Resp 14   Wt 71 kg   SpO2 100%   BMI 27.73 kg/m   Physical Exam Vitals and nursing note reviewed.  Constitutional:      Appearance: She is well-developed.  HENT:     Head: Normocephalic.  Nose: Nose normal.  Eyes:     General: No scleral icterus.    Conjunctiva/sclera: Conjunctivae normal.  Neck:     Thyroid: No thyromegaly.  Cardiovascular:     Rate and Rhythm: Normal rate and regular rhythm.     Heart sounds: No murmur. No friction rub. No gallop.   Pulmonary:     Breath sounds: No stridor. No wheezing or rales.  Chest:     Chest wall: No tenderness.  Abdominal:     General: There is no distension.     Tenderness: There is no abdominal tenderness. There is no rebound.  Musculoskeletal:        General: Normal range of motion.     Cervical back: Neck supple.     Comments: Mild weakness left arm and left leg with numbness in her left face and left arm.  The weakness is improved from earlier and about back to her normal weakness  Lymphadenopathy:     Cervical: No cervical adenopathy.  Skin:    Findings: No erythema or rash.  Neurological:     Mental Status: She is alert and oriented to person, place, and time.     Motor: No abnormal muscle tone.     Coordination: Coordination normal.  Psychiatric:        Behavior: Behavior normal.     ED Results / Procedures / Treatments   Labs (all labs ordered are listed, but only abnormal results are displayed) Labs Reviewed  CBC - Abnormal; Notable for the following components:      Result Value   MCV 101.4 (*)    All other components within normal limits  COMPREHENSIVE METABOLIC  PANEL - Abnormal; Notable for the following components:   Creatinine, Ser 1.30 (*)    GFR calc non Af Amer 38 (*)    GFR calc Af Amer 44 (*)    All other components within normal limits  URINALYSIS, ROUTINE W REFLEX MICROSCOPIC - Abnormal; Notable for the following components:   Color, Urine COLORLESS (*)    Specific Gravity, Urine 1.003 (*)    Leukocytes,Ua TRACE (*)    All other components within normal limits  SARS CORONAVIRUS 2 (TAT 6-24 HRS)  ETHANOL  PROTIME-INR  APTT  DIFFERENTIAL  RAPID URINE DRUG SCREEN, HOSP PERFORMED  CBG MONITORING, ED    EKG None  Radiology CT HEAD CODE STROKE WO CONTRAST  Result Date: 02/28/2019 CLINICAL DATA:  Code stroke. Left-sided weakness and numbness beginning 1730 hours. EXAM: CT HEAD WITHOUT CONTRAST TECHNIQUE: Contiguous axial images were obtained from the base of the skull through the vertex without intravenous contrast. COMPARISON:  12/27/2018 FINDINGS: Brain: Generalized brain atrophy. Extensive chronic small-vessel ischemic changes throughout the cerebral hemispheric white matter, basal ganglia, thalami and pons. No identifiable acute infarction, mass lesion, hemorrhage, hydrocephalus or extra-axial collection. Vascular: There is atherosclerotic calcification of the major vessels at the base of the brain. Skull: Negative Sinuses/Orbits: Clear/normal Other: None ASPECTS ([place] Stroke Program Early CT Score) - Ganglionic level infarction (caudate, lentiform nuclei, internal capsule, insula, M1-M3 cortex): 7 - Supraganglionic infarction (M4-M6 cortex): 3 Total score (0-10 with 10 being normal): 10 IMPRESSION: 1. No acute finding by CT. Advanced chronic small-vessel ischemic changes throughout as described. 2. ASPECTS is 10. 3. These results were called by telephone at the time of interpretation on 02/28/2019 at 9:53 pm to provider Roena Sassaman , who verbally acknowledged these results. Electronically Signed   By: Nelson Chimes M.D.   On: 02/28/2019  21:54    Procedures Procedures (including critical care time)  Medications Ordered in ED Medications - No data to display  ED Course  I have reviewed the triage vital signs and the nursing notes.  Pertinent labs & imaging results that were available during my care of the patient were reviewed by me and considered in my medical decision making (see chart for details).    MDM Rules/Calculators/A&P                      CRITICAL CARE Performed by: Milton Ferguson Total critical care time: 35 minutes Critical care time was exclusive of separately billable procedures and treating other patients. Critical care was necessary to treat or prevent imminent or life-threatening deterioration. Critical care was time spent personally by me on the following activities: development of treatment plan with patient and/or surrogate as well as nursing, discussions with consultants, evaluation of patient's response to treatment, examination of patient, obtaining history from patient or surrogate, ordering and performing treatments and interventions, ordering and review of laboratory studies, ordering and review of radiographic studies, pulse oximetry and re-evaluation of patient's condition. Patient seen by neurology for code stroke.  Patient is not a TPA candidate and does not have large stroke symptoms.  Neurology just recommended admission for TIA work-up Final Clinical Impression(s) / ED Diagnoses Final diagnoses:  TIA (transient ischemic attack)    Rx / DC Orders ED Discharge Orders    None       Milton Ferguson, MD 02/28/19 2305

## 2019-03-01 ENCOUNTER — Observation Stay (HOSPITAL_BASED_OUTPATIENT_CLINIC_OR_DEPARTMENT_OTHER): Payer: Medicare Other

## 2019-03-01 ENCOUNTER — Observation Stay (HOSPITAL_COMMUNITY): Payer: Medicare Other

## 2019-03-01 ENCOUNTER — Encounter (HOSPITAL_COMMUNITY): Payer: Self-pay | Admitting: Family Medicine

## 2019-03-01 DIAGNOSIS — G8194 Hemiplegia, unspecified affecting left nondominant side: Secondary | ICD-10-CM

## 2019-03-01 DIAGNOSIS — I6523 Occlusion and stenosis of bilateral carotid arteries: Secondary | ICD-10-CM | POA: Diagnosis not present

## 2019-03-01 DIAGNOSIS — G459 Transient cerebral ischemic attack, unspecified: Secondary | ICD-10-CM

## 2019-03-01 DIAGNOSIS — R209 Unspecified disturbances of skin sensation: Secondary | ICD-10-CM

## 2019-03-01 DIAGNOSIS — I361 Nonrheumatic tricuspid (valve) insufficiency: Secondary | ICD-10-CM

## 2019-03-01 DIAGNOSIS — R29818 Other symptoms and signs involving the nervous system: Secondary | ICD-10-CM | POA: Diagnosis not present

## 2019-03-01 DIAGNOSIS — R202 Paresthesia of skin: Secondary | ICD-10-CM | POA: Diagnosis not present

## 2019-03-01 LAB — ECHOCARDIOGRAM COMPLETE
Height: 64 in
Weight: 2483.2 oz

## 2019-03-01 LAB — LIPID PANEL
Cholesterol: 143 mg/dL (ref 0–200)
HDL: 54 mg/dL (ref >40)
LDL Cholesterol: 81 mg/dL (ref 0–99)
Total CHOL/HDL Ratio: 2.6 RATIO
Triglycerides: 42 mg/dL (ref <150)
VLDL: 8 mg/dL (ref 0–40)

## 2019-03-01 LAB — SARS CORONAVIRUS 2 (TAT 6-24 HRS): SARS Coronavirus 2: NEGATIVE

## 2019-03-01 LAB — HEMOGLOBIN A1C
Hgb A1c MFr Bld: 6.5 % — ABNORMAL HIGH (ref 4.8–5.6)
Mean Plasma Glucose: 139.85 mg/dL

## 2019-03-01 MED ORDER — GABAPENTIN 300 MG PO CAPS
300.0000 mg | ORAL_CAPSULE | Freq: Two times a day (BID) | ORAL | Status: DC
  Administered 2019-03-01 – 2019-03-02 (×3): 300 mg via ORAL
  Filled 2019-03-01 (×3): qty 1

## 2019-03-01 MED ORDER — HYDRALAZINE HCL 20 MG/ML IJ SOLN: 10.0000 mg | Freq: Four times a day (QID) | INTRAMUSCULAR | Status: DC | PRN

## 2019-03-01 MED ORDER — METOPROLOL TARTRATE 25 MG PO TABS: 37.5000 mg | ORAL_TABLET | Freq: Two times a day (BID) | ORAL | Status: DC

## 2019-03-01 MED ORDER — SODIUM CHLORIDE 0.9 % IV SOLN: INTRAVENOUS | Status: DC

## 2019-03-01 MED ORDER — LEVETIRACETAM 250 MG PO TABS
250.0000 mg | ORAL_TABLET | Freq: Two times a day (BID) | ORAL | Status: DC
  Administered 2019-03-02: 11:00:00 250 mg via ORAL
  Filled 2019-03-01: qty 1

## 2019-03-01 MED ORDER — ASCRIPTIN 325 MG PO TABS: 1.0000 | ORAL_TABLET | Freq: Every day | ORAL | Status: DC

## 2019-03-01 MED ORDER — STROKE: EARLY STAGES OF RECOVERY BOOK: Freq: Once | Status: AC

## 2019-03-01 MED ORDER — ATORVASTATIN CALCIUM 40 MG PO TABS
80.0000 mg | ORAL_TABLET | Freq: Every day | ORAL | Status: DC
  Administered 2019-03-01 – 2019-03-02 (×2): 80 mg via ORAL
  Filled 2019-03-01 (×2): qty 2

## 2019-03-01 MED ORDER — VITAMIN D 25 MCG (1000 UNIT) PO TABS
1000.0000 [IU] | ORAL_TABLET | Freq: Every day | ORAL | Status: DC
  Administered 2019-03-01 – 2019-03-02 (×2): 1000 [IU] via ORAL
  Filled 2019-03-01 (×2): qty 1

## 2019-03-01 MED ORDER — ACETAMINOPHEN 650 MG RE SUPP: 650.0000 mg | RECTAL | Status: DC | PRN

## 2019-03-01 MED ORDER — ACETAMINOPHEN 325 MG PO TABS: 650.0000 mg | ORAL_TABLET | ORAL | Status: DC | PRN

## 2019-03-01 MED ORDER — LINACLOTIDE 72 MCG PO CAPS
72.0000 ug | ORAL_CAPSULE | Freq: Every day | ORAL | Status: DC | PRN
  Filled 2019-03-01: qty 1

## 2019-03-01 MED ORDER — ASPIRIN 325 MG PO TABS
325.0000 mg | ORAL_TABLET | Freq: Every day | ORAL | Status: DC
  Administered 2019-03-01 – 2019-03-02 (×2): 325 mg via ORAL
  Filled 2019-03-01 (×2): qty 1

## 2019-03-01 MED ORDER — HEPARIN SODIUM (PORCINE) 5000 UNIT/ML IJ SOLN
5000.0000 [IU] | Freq: Three times a day (TID) | INTRAMUSCULAR | Status: DC
  Administered 2019-03-01 – 2019-03-02 (×3): 5000 [IU] via SUBCUTANEOUS
  Filled 2019-03-01 (×3): qty 1

## 2019-03-01 MED ORDER — AMLODIPINE BESYLATE 5 MG PO TABS: 5.0000 mg | ORAL_TABLET | Freq: Every day | ORAL | Status: DC

## 2019-03-01 MED ORDER — ACETAMINOPHEN 160 MG/5ML PO SOLN: 650.0000 mg | ORAL | Status: DC | PRN

## 2019-03-01 MED ORDER — LEVETIRACETAM IN NACL 500 MG/100ML IV SOLN
500.0000 mg | Freq: Once | INTRAVENOUS | Status: AC
  Administered 2019-03-01: 500 mg via INTRAVENOUS
  Filled 2019-03-01: qty 100

## 2019-03-01 NOTE — H&P (Signed)
TRH H&P    Patient Demographics:    [name], is a 84 y.o. female  MRN: [id number]  DOB - May 16, 1934  Admit Date - 02/28/2019  Referring MD/NP/PA: Dr. Roderic Palau  Outpatient Primary MD for the patient is [facility], Modena Nunnery, MD  Patient coming from: Home  Chief complaint- Weakness and paresthesias   HPI:    [name]  is a 84 y.o. female, with history of CVA in September of this year, hypothyroidism, hyperlipidemia, hypertension, and more who presents to the ED today with left-sided weakness and paresthesias on left face.  Patient reports that her symptoms started around 5:30 PM on 02/28/2019.  She noticed paresthesias on her left side of face and increased weakness on her left upper extremity and left lower extremity.  Patient is prior stroke did leave her with residual left-sided weakness, but today she reports that it became worse.  She also had an associated tremulousness on her left side upper extremity and lower extremity.  Patient reports no pain, no change in speech, no difficulty swallowing, no change in vision, no change in hearing.  Patient reports no headache.  Patient does report urinary frequency and says that she associates this with an increase in blood pressure.  She checked her blood pressure at home and reports that the diastolic number was high, but does not know the exact numbers.  Patient reports no missed doses of her medications.  Patient does report that her primary care physician has taken her off of Eliquis, which she was on for history of [place], due to microbleeds.  Patient has no other complaints at this time  ED course CHEM panel was grossly normal with a creatinine of 1.30 which is at baseline.  GFR was 44 which is also at baseline.  Hematology shows white blood cell count of 5.8 and hemoglobin of 13.7.  MCV was elevated at 101.4.  Covid negative.  UA shows trace  leukocytes, negative nitrites, 6-10 white blood cells-not suspicious for UTI.  UDS is clear.  CT head shows #1.  Resolution of the punctate hyperattenuating focus of hemorrhage in the right frontal lobe seen on comparison studies.  Additional microhemorrhages seen by susceptibility artifact on prior MRI are not CT evident. 2.  No acute intracranial abnormality. 3.  Stable parenchymal volume loss and chronic microvascular ischemic white matter disease. 4.  Stable appearance of remote bilateral lacunar infarcts.  EKG shows a rate of 68, sinus rhythm, QTc 419.   Review of systems:    In addition to the HPI above,  No Fever-chills, No Headache, No changes with Vision or hearing, No problems swallowing food or Liquids, No Chest pain, Cough or Shortness of Breath, No Abdominal pain, No Nausea or Vomiting, bowel movements are regular, No Blood in stool or Urine, No dysuria, No new skin rashes or bruises, No new joints pains-aches,  No recent weight gain or loss, No polydypsia or polyphagia, No significant Mental Stressors.  All other systems reviewed and are negative.    Past History of the following :  Past Medical History:  Diagnosis Date  . Allergy   . Arthritis   . Colon cancer ([place])    colon ca dx 07/30/09  . History of cardiac monitoring 07/2017   "Event monitor demonstrated sinus rhythm with isolated PACs and no arrhythmias"  . History of colon cancer 06/2009   found at time of TCS 06/29/09, 1.2cm sessile cecal polyp, no adjuvent therapy needed  . HTN (hypertension)   . Hx of cardiovascular stress test 07/2017   "No diagnostic ST segment changes to indicate ischemia. Small, moderate intensity, reversible apical to basal inferolateral defect consistent with ischemia. This is a low risk study. Nuclear stress EF: 84%."  . Hyperlipidemia   . Hypothyroidism   . PE (pulmonary thromboembolism) ([place])   . Renal disorder    cyst on kidney   . Stroke ([place])   . Vertigo        Past Surgical History:  Procedure Laterality Date  . ABDOMINAL HYSTERECTOMY    . COLON SURGERY  07/2009   right hemicolectomy, no residual colon cancer on path  . COLONOSCOPY  07/16/2010   [id number] POLYP-TCS 3 YEARS  . COLONOSCOPY N/A 08/02/2013   hyperplastic polyps, surveillance in 2020 if benefits outweight the risks  . COLONOSCOPY  06/2009   1.2 cm sessile cecal polyp which had adenocarcinoma arising in a tubular adenoma.  Marland Kitchen PARTIAL THYMECTOMY    . partial thyroidectomy     benign tumors      Social History:      Social History   Tobacco Use  . Smoking status: Never Smoker  . Smokeless tobacco: Never Used  Substance Use Topics  . Alcohol use: No    Alcohol/week: 0.0 standard drinks       Family History :     Family History  Problem Relation Age of Onset  . Colon cancer Mother        >age60  . Arthritis Mother   . Cancer Mother   . Heart disease Mother   . Hyperlipidemia Mother   . Hypertension Mother   . Heart attack Father   . Heart disease Father   . Diabetes Maternal Aunt   . Hyperlipidemia Daughter   . Hypertension Daughter   . Liver disease Neg Hx       Home Medications:   Prior to Admission medications   Medication Sig Start Date End Date Taking? Authorizing Provider  amLODipine (NORVASC) 5 MG tablet Take 1 tablet (5 mg total) by mouth daily. 12/05/18  Yes [place], Modena Nunnery, MD  atorvastatin (LIPITOR) 40 MG tablet Take 2 tablets (80 mg total) by mouth daily. Patient taking differently: Take 40 mg by mouth daily.  08/22/18  Yes [place], Modena Nunnery, MD  linaclotide (LINZESS) 72 MCG capsule TAKE 1 CAPSULE BY MOUTH DAILY BEFORE BREAKFAST. Patient taking differently: Take 72 mcg by mouth daily as needed (constipation).  02/28/18  Yes [place], Modena Nunnery, MD  metoprolol tartrate (LOPRESSOR) 25 MG tablet Take 1.5 tablets (37.5 mg total) by mouth 2 (two) times daily. 11/13/18  Yes [place], [place] M, PA-C  traMADol (ULTRAM) 50 MG tablet Take 50 mg  by mouth every 8 (eight) hours as needed. 01/10/19  Yes [provider]  acetaminophen (TYLENOL) 500 MG tablet Take 1,000 mg by mouth every 8 (eight) hours as needed (cold).    [provider]  Aspirin Buf,AlHyd-MgHyd-CaCar, (ASCRIPTIN) 325 MG TABS Take 1 tablet by mouth daily. 01/04/19   Alycia Rossetti, MD  cholecalciferol (VITAMIN D3) 25  MCG (1000 UT) tablet Take 1,000 Units by mouth daily.     [provider]  gabapentin (NEURONTIN) 300 MG capsule Take 1 in the morning and 1 in the evening Patient taking differently: Take 300 mg by mouth 2 (two) times daily.  10/26/18   [place], Modena Nunnery, MD  RESTASIS 0.05 % ophthalmic emulsion Place 1 drop into both eyes 2 (two) times daily as needed (chronic dry eye).  05/17/10   [provider]     Allergies:    No Known Allergies   Physical Exam:   Vitals  Blood pressure 120/76, pulse 61, temperature 98.1 F (36.7 C), resp. rate (!) 23, weight 71 kg, SpO2 100 %.  1.  General: Lying supine in bed in no acute distress  2. Psychiatric: Pleasant, cooperative with exam, mood appropriate for situation  3. Neurologic: Cranial nerves II through XII are grossly intact, hypersensitivity into the left side of face, moves all 4 extremities voluntarily, weakness in left upper extremity and lower extremity as compared to right 4/5, normal sensation in the extremities.  4. HEENMT:  Head is atraumatic normocephalic, pupils are reactive to light, extraocular muscles intact, visual fields intact   5. Respiratory : Lungs are clear to auscultation bilaterally  6. Cardiovascular : Heart rate is normal, rhythm is regular, the second heart sound is louder than first  7. Gastrointestinal:  Abdomen is soft nondistended nontender to palpation  8. Skin:  No acute lesions  9.Musculoskeletal:  2+ peripheral edema to Mid shin bilaterally   Data Review:    CBC Recent Labs  Lab 02/28/19 2133  WBC 5.8  HGB 13.7  HCT  43.2  PLT 287  MCV 101.4*  MCH 32.2  MCHC 31.7  RDW 14.3  LYMPHSABS 2.4  MONOABS 0.5  EOSABS 0.2  BASOSABS 0.0   ------------------------------------------------------------------------------------------------------------------  Results for orders placed or performed during the hospital encounter of 02/28/19 (from the past 48 hour(s))  Ethanol     Status: None   Collection Time: 02/28/19  9:33 PM  Result Value Ref Range   Alcohol, Ethyl (B) <10 <10 mg/dL    Comment: (NOTE) Lowest detectable limit for serum alcohol is 10 mg/dL. For medical purposes only. Performed at [facility], [address], [place], [place] [address]   Protime-INR     Status: None   Collection Time: 02/28/19  9:33 PM  Result Value Ref Range   Prothrombin Time 13.2 11.4 - 15.2 seconds   INR 1.0 0.8 - 1.2    Comment: (NOTE) INR goal varies based on device and disease states. Performed at [facility], [address], [place], [place] [address]   APTT     Status: None   Collection Time: 02/28/19  9:33 PM  Result Value Ref Range   aPTT 30 24 - 36 seconds    Comment: Performed at [facility], [address] [place], [place] [address]  CBC     Status: Abnormal   Collection Time: 02/28/19  9:33 PM  Result Value Ref Range   WBC 5.8 4.0 - 10.5 K/uL   RBC 4.26 3.87 - 5.11 MIL/uL   Hemoglobin 13.7 12.0 - 15.0 g/dL   HCT 43.2 36.0 - 46.0 %   MCV 101.4 (H) 80.0 - 100.0 fL   MCH 32.2 26.0 - 34.0 pg   MCHC 31.7 30.0 - 36.0 g/dL   RDW 14.3 11.5 - 15.5 %   Platelets 287 150 - 400 K/uL   nRBC 0.0 0.0 - 0.2 %  Comment: Performed at [facility], [address], [place], [place] [address]  Differential     Status: None   Collection Time: 02/28/19  9:33 PM  Result Value Ref Range   Neutrophils Relative % 44 %   Neutro Abs 2.5 1.7 - 7.7 K/uL   Lymphocytes Relative 42 %   Lymphs Abs 2.4 0.7 - 4.0 K/uL   Monocytes Relative 9 %   Monocytes Absolute 0.5 0.1 - 1.0 K/uL   Eosinophils Relative 4 %   Eosinophils  Absolute 0.2 0.0 - 0.5 K/uL   Basophils Relative 1 %   Basophils Absolute 0.0 0.0 - 0.1 K/uL   Immature Granulocytes 0 %   Abs Immature Granulocytes 0.01 0.00 - 0.07 K/uL    Comment: Performed at [facility], [address] [place], [place] [address]  Comprehensive metabolic panel     Status: Abnormal   Collection Time: 02/28/19  9:33 PM  Result Value Ref Range   Sodium 141 135 - 145 mmol/L   Potassium 3.7 3.5 - 5.1 mmol/L   Chloride 103 98 - 111 mmol/L   CO2 28 22 - 32 mmol/L   Glucose, Bld 96 70 - 99 mg/dL   BUN 22 8 - 23 mg/dL   Creatinine, Ser 1.30 (H) 0.44 - 1.00 mg/dL   Calcium 9.8 8.9 - 10.3 mg/dL   Total Protein 7.9 6.5 - 8.1 g/dL   Albumin 4.1 3.5 - 5.0 g/dL   AST 21 15 - 41 U/L   ALT 23 0 - 44 U/L   Alkaline Phosphatase 99 38 - 126 U/L   Total Bilirubin 0.9 0.3 - 1.2 mg/dL   GFR calc non Af Amer 38 (L) >60 mL/min   GFR calc Af Amer 44 (L) >60 mL/min   Anion gap 10 5 - 15    Comment: Performed at [facility], [address], [place], [place] [address]  Urine rapid drug screen (hosp performed)     Status: None   Collection Time: 02/28/19  9:40 PM  Result Value Ref Range   Opiates NONE DETECTED NONE DETECTED   Cocaine NONE DETECTED NONE DETECTED   Benzodiazepines NONE DETECTED NONE DETECTED   Amphetamines NONE DETECTED NONE DETECTED   Tetrahydrocannabinol NONE DETECTED NONE DETECTED   Barbiturates NONE DETECTED NONE DETECTED    Comment: (NOTE) DRUG SCREEN FOR MEDICAL PURPOSES ONLY.  IF CONFIRMATION IS NEEDED FOR ANY PURPOSE, NOTIFY LAB WITHIN 5 DAYS. LOWEST DETECTABLE LIMITS FOR URINE DRUG SCREEN Drug Class                     Cutoff (ng/mL) Amphetamine and metabolites    1000 Barbiturate and metabolites    200 Benzodiazepine                 [id number] Tricyclics and metabolites     300 Opiates and metabolites        300 Cocaine and metabolites        300 THC                            50 Performed at [facility], [address], [place],  [address]    Urinalysis, Routine w reflex microscopic     Status: Abnormal   Collection Time: 02/28/19  9:40 PM  Result Value Ref Range   Color, Urine COLORLESS (A) YELLOW   APPearance CLEAR CLEAR   Specific Gravity, Urine 1.003 (L) 1.005 - 1.030  pH 7.0 5.0 - 8.0   Glucose, UA NEGATIVE NEGATIVE mg/dL   Hgb urine dipstick NEGATIVE NEGATIVE   Bilirubin Urine NEGATIVE NEGATIVE   Ketones, ur NEGATIVE NEGATIVE mg/dL   Protein, ur NEGATIVE NEGATIVE mg/dL   Nitrite NEGATIVE NEGATIVE   Leukocytes,Ua TRACE (A) NEGATIVE   RBC / HPF 0-5 0 - 5 RBC/hpf   WBC, UA 6-10 0 - 5 WBC/hpf   Bacteria, UA NONE SEEN NONE SEEN   Squamous Epithelial / LPF 0-5 0 - 5    Comment: Performed at [facility], [address], [place], [place] [address]  CBG monitoring, ED     Status: None   Collection Time: 02/28/19  9:58 PM  Result Value Ref Range   Glucose-Capillary 89 70 - 99 mg/dL    Chemistries  Recent Labs  Lab 02/28/19 2133  NA 141  K 3.7  CL 103  CO2 28  GLUCOSE 96  BUN 22  CREATININE 1.30*  CALCIUM 9.8  AST 21  ALT 23  ALKPHOS 99  BILITOT 0.9   ------------------------------------------------------------------------------------------------------------------  ------------------------------------------------------------------------------------------------------------------ GFR: Estimated Creatinine Clearance: 31.5 mL/min (A) (by C-G formula based on SCr of 1.3 mg/dL (H)). Liver Function Tests: Recent Labs  Lab 02/28/19 2133  AST 21  ALT 23  ALKPHOS 99  BILITOT 0.9  PROT 7.9  ALBUMIN 4.1   No results for input(s): LIPASE, AMYLASE in the last 168 hours. No results for input(s): AMMONIA in the last 168 hours. Coagulation Profile: Recent Labs  Lab 02/28/19 2133  INR 1.0   Cardiac Enzymes: No results for input(s): CKTOTAL, CKMB, CKMBINDEX, TROPONINI in the last 168 hours. BNP (last 3 results) No results for input(s): PROBNP in the last 8760 hours. HbA1C: No results for input(s): HGBA1C  in the last 72 hours. CBG: Recent Labs  Lab 02/28/19 2158  GLUCAP 89   Lipid Profile: No results for input(s): CHOL, HDL, LDLCALC, TRIG, CHOLHDL, LDLDIRECT in the last 72 hours. Thyroid Function Tests: No results for input(s): TSH, T4TOTAL, FREET4, T3FREE, THYROIDAB in the last 72 hours. Anemia Panel: No results for input(s): VITAMINB12, FOLATE, FERRITIN, TIBC, IRON, RETICCTPCT in the last 72 hours.  --------------------------------------------------------------------------------------------------------------- Urine analysis:    Component Value Date/Time   COLORURINE COLORLESS (A) 02/28/2019 2140   APPEARANCEUR CLEAR 02/28/2019 2140   LABSPEC 1.003 (L) 02/28/2019 2140   PHURINE 7.0 02/28/2019 2140   GLUCOSEU NEGATIVE 02/28/2019 2140   HGBUR NEGATIVE 02/28/2019 2140   BILIRUBINUR NEGATIVE 02/28/2019 2140   KETONESUR NEGATIVE 02/28/2019 2140   PROTEINUR NEGATIVE 02/28/2019 2140   UROBILINOGEN 0.2 03/12/2012 0022   NITRITE NEGATIVE 02/28/2019 2140   LEUKOCYTESUR TRACE (A) 02/28/2019 2140      Imaging Results:    CT HEAD CODE STROKE WO CONTRAST  Result Date: 02/28/2019 CLINICAL DATA:  Code stroke. Left-sided weakness and numbness beginning 1730 hours. EXAM: CT HEAD WITHOUT CONTRAST TECHNIQUE: Contiguous axial images were obtained from the base of the skull through the vertex without intravenous contrast. COMPARISON:  12/27/2018 FINDINGS: Brain: Generalized brain atrophy. Extensive chronic small-vessel ischemic changes throughout the cerebral hemispheric white matter, basal ganglia, thalami and pons. No identifiable acute infarction, mass lesion, hemorrhage, hydrocephalus or extra-axial collection. Vascular: There is atherosclerotic calcification of the major vessels at the base of the brain. Skull: Negative Sinuses/Orbits: Clear/normal Other: None ASPECTS ([place] Stroke Program Early CT Score) - Ganglionic level infarction (caudate, lentiform nuclei, internal capsule, insula, M1-M3  cortex): 7 - Supraganglionic infarction (M4-M6 cortex): 3 Total score (0-10 with 10 being normal): 10  IMPRESSION: 1. No acute finding by CT. Advanced chronic small-vessel ischemic changes throughout as described. 2. ASPECTS is 10. 3. These results were called by telephone at the time of interpretation on 02/28/2019 at 9:53 pm to provider JOSEPH ZAMMIT , who verbally acknowledged these results. Electronically Signed   By: Nelson Chimes M.D.   On: 02/28/2019 21:54    My personal review of EKG: Rhythm NSR, Rate 68 /min, QTc 419 ,no Acute ST changes    Assessment & Plan:    Active Problems:   TIA (transient ischemic attack)   1. TIA versus CVA 1. Last echo was done in September [id number] -grade 1 diastolic dysfunction, EF 65 to 70% 2. Will not repeat echo at this closing interval 3. MRI brain pending 4. Continue aspirin and statin 5. Continue blood pressure control 6. Monitor on telemetry 7. Neurochecks  2.  Other chronic medical conditions  Continue home medications     DVT Prophylaxis-   Heparin- SCDs     AM Labs Ordered, also please review Full Orders  Family Communication: No family at bedside  Admission status: Observation: Based on patients clinical presentation and evaluation of above clinical data, I have made determination that patient meets Inpatient criteria at this time.  Time spent in minutes : [place]

## 2019-03-01 NOTE — Progress Notes (Addendum)
PROGRESS NOTE  [name] [id number] DOB: April 01, 1934 DOA: 02/28/2019 PCP: Alycia Rossetti, MD  Brief History:  84 year old female with a history of TIA, hyperlipidemia, hypertension, and pulmonary embolus presenting with left facial dysesthesias and left-sided weakness. The patient states that she has some residual left-sided weakness from a previous stroke, but stated that this was slightly worse than usual. She denied any visual disturbance, dysarthria, dysphasia. Notably, the patient states that she was taken off her apixaban after her hospitalization in September 2020 when she was noted to have some microhemorrhages in the right frontal lobe. She has been taking aspirin 325 mg since then. She denies any fevers, chills, headache, visual disturbance, chest pain, shortness breath, nausea, vomiting, diarrhea, abdominal pain, dizziness, syncope. Since admission, the patient states that her dysesthesia and left-sided weakness are improving. In the emergency department, the patient was afebrile hemodynamically stable saturating 100% room air. BMP, LFTs, and CBC were unremarkable. CT of the brain did not show any acute intracranial abnormalities. Urinalysis was negative for significant pyuria. The patient was admitted for further evaluation and treatment.  Assessment/Plan: Sensory disturbance/left-sided weakness -Appreciate Neurology Consult -PT/OT evaluation -Speech therapy eval -CT brain--neg -MRI brain-- -MRA brain-- -Carotid Duplex-- -Echo-- -LDL--81 -HbA1C-- -Antiplatelet--ASA 325 mg daily  CKD stage III -Baseline creatinine 1.2-1.5 -A.m. BMP  Hyperlipidemia -Continue statin  Essential hypertension -Holding amlodipine and metoprolol to allow for permissive hypertension            Disposition Plan:   Home in 1-2 days  Family Communication:   Family at bedside  Consultants:  neurology  Code Status:  DNR  DVT Prophylaxis:  [place]  Heparin   Procedures: As Listed in Progress Note Above  Antibiotics: None    Total time spent 35 minutes.  Greater than 50% spent face to face counseling and coordinating care.    Subjective: She feels that her dysesthesia and left-sided weakness are improving. She denies any headache, visual disturbance, chest pain, breath, nausea, vomiting, diarrhea, abdominal pain, dysuria, hematuria.  Objective: Vitals:   03/01/19 0100 03/01/19 0330 03/01/19 0530 03/01/19 0730  BP: 120/76 (!) 155/78 (!) 141/68 138/70  Pulse: 61 66 75 62  Resp: (!) 23 16 16 16   Temp:  98 F (36.7 C) 98 F (36.7 C) 98.4 F (36.9 C)  TempSrc:  Oral Oral Oral  SpO2: 100% 97% 97% 99%  Weight:  70.4 kg    Height:  5\' 4"  (1.626 m)      Intake/Output Summary (Last 24 hours) at 03/01/2019 0805 Last data filed at 03/01/2019 0600 Gross per 24 hour  Intake 55 ml  Output 400 ml  Net -345 ml   Weight change:  Exam:   General:  Pt is alert, follows commands appropriately, not in acute distress  HEENT: No icterus, No thrush, No neck mass, [place]/AT  Cardiovascular: RRR, S1/S2, no rubs, no gallops  Respiratory: CTA bilaterally, no wheezing, no crackles, no rhonchi  Abdomen: Soft/+BS, non tender, non distended, no guarding  Extremities: No edema, No lymphangitis, No petechiae, No rashes, no synovitis  Neuro:  CN II-XII intact, strength 4/5 in RUE, RLE, strength 4-/5 LUE, LLE; sensation intact bilateral; no dysmetria; babinski equivocal     Data Reviewed: I have personally reviewed following labs and imaging studies Basic Metabolic Panel: Recent Labs  Lab 02/28/19 2133  NA 141  K 3.7  CL 103  CO2 28  GLUCOSE 96  BUN 22  CREATININE 1.30*  CALCIUM 9.8   Liver Function Tests: Recent Labs  Lab 02/28/19 2133  AST 21  ALT 23  ALKPHOS 99  BILITOT 0.9  PROT 7.9  ALBUMIN 4.1   No results for input(s): LIPASE, AMYLASE in the last 168 hours. No results for input(s): AMMONIA in the last 168  hours. Coagulation Profile: Recent Labs  Lab 02/28/19 2133  INR 1.0   CBC: Recent Labs  Lab 02/28/19 2133  WBC 5.8  NEUTROABS 2.5  HGB 13.7  HCT 43.2  MCV 101.4*  PLT 287   Cardiac Enzymes: No results for input(s): CKTOTAL, CKMB, CKMBINDEX, TROPONINI in the last 168 hours. BNP: Invalid input(s): POCBNP CBG: Recent Labs  Lab 02/28/19 2158  GLUCAP 89   HbA1C: No results for input(s): HGBA1C in the last 72 hours. Urine analysis:    Component Value Date/Time   COLORURINE COLORLESS (A) 02/28/2019 2140   APPEARANCEUR CLEAR 02/28/2019 2140   LABSPEC 1.003 (L) 02/28/2019 2140   PHURINE 7.0 02/28/2019 2140   GLUCOSEU NEGATIVE 02/28/2019 2140   HGBUR NEGATIVE 02/28/2019 2140   BILIRUBINUR NEGATIVE 02/28/2019 2140   KETONESUR NEGATIVE 02/28/2019 2140   PROTEINUR NEGATIVE 02/28/2019 2140   UROBILINOGEN 0.2 03/12/2012 0022   NITRITE NEGATIVE 02/28/2019 2140   LEUKOCYTESUR TRACE (A) 02/28/2019 2140   Sepsis Labs: @LABRCNTIP (procalcitonin:4,lacticidven:4) )No results found for this or any previous visit (from the past 240 hour(s)).   Scheduled Meds: . amLODipine  5 mg Oral Daily  . aspirin  325 mg Oral Daily  . atorvastatin  80 mg Oral Daily  . cholecalciferol  1,000 Units Oral Daily  . gabapentin  300 mg Oral BID  . heparin  5,000 Units Subcutaneous Q8H  . metoprolol tartrate  37.5 mg Oral BID   Continuous Infusions: . sodium chloride 75 mL/hr at 03/01/19 0516    Procedures/Studies: CT HEAD CODE STROKE WO CONTRAST  Result Date: 02/28/2019 CLINICAL DATA:  Code stroke. Left-sided weakness and numbness beginning 1730 hours. EXAM: CT HEAD WITHOUT CONTRAST TECHNIQUE: Contiguous axial images were obtained from the base of the skull through the vertex without intravenous contrast. COMPARISON:  12/27/2018 FINDINGS: Brain: Generalized brain atrophy. Extensive chronic small-vessel ischemic changes throughout the cerebral hemispheric white matter, basal ganglia, thalami and  pons. No identifiable acute infarction, mass lesion, hemorrhage, hydrocephalus or extra-axial collection. Vascular: There is atherosclerotic calcification of the major vessels at the base of the brain. Skull: Negative Sinuses/Orbits: Clear/normal Other: None ASPECTS ([place] Stroke Program Early CT Score) - Ganglionic level infarction (caudate, lentiform nuclei, internal capsule, insula, M1-M3 cortex): 7 - Supraganglionic infarction (M4-M6 cortex): 3 Total score (0-10 with 10 being normal): 10 IMPRESSION: 1. No acute finding by CT. Advanced chronic small-vessel ischemic changes throughout as described. 2. ASPECTS is 10. 3. These results were called by telephone at the time of interpretation on 02/28/2019 at 9:53 pm to provider JOSEPH ZAMMIT , who verbally acknowledged these results. Electronically Signed   By: Nelson Chimes M.D.   On: 02/28/2019 21:54    Orson Eva, DO  [facility] Pager [phone]  If 7PM-7AM, please contact night-coverage [email or web address] Password [email or web address] 03/01/2019, 8:05 AM   LOS: 0 days

## 2019-03-01 NOTE — Evaluation (Signed)
Occupational Therapy Evaluation Patient Details Name: [name] MRN: [id number] DOB: 08/10/1934 Today's Date: 03/01/2019    History of Present Illness [name]  is a 84 y.o. female, with history of CVA in September of this year, hypothyroidism, hyperlipidemia, hypertension, and more who presents to the ED today with left-sided weakness and paresthesias on left face.  Patient reports that her symptoms started around 5:30 PM on 02/28/2019.  She noticed paresthesias on her left side of face and increased weakness on her left upper extremity and left lower extremity.  Patient is prior stroke did leave her with residual left-sided weakness, but today she reports that it became worse.  She also had an associated tremulousness on her left side upper extremity and lower extremity.  Patient reports no pain, no change in speech, no difficulty swallowing, no change in vision, no change in hearing   Clinical Impression   Pt finishing with PT evaluation upon therapy arrival and agreeable to complete OT evaluation. Patient is presenting close if not at baseline level for UB strength. LUE is slightly decreased from left. Patient reports that she feels a little weaker than her baseline but not far off. She is not having any deficits related to ADL completion. She lives alone although reports her daughter frequently check in on her via phone/text as well as in-person. Her daughter provides her transportation since September (CVA). Daughter also completes grocery shopping and will be present during showers. At this time, patient does not require any additional follow up OT services. Thank-you for the referral.    Follow Up Recommendations  No OT follow up    Equipment Recommendations  None recommended by OT       Precautions / Restrictions Precautions Precautions: Fall Precaution Comments: History of residual left side weakness from previous CVA Restrictions Weight Bearing  Restrictions: No      Mobility Bed Mobility Overal bed mobility: (Not assessed)    Transfers Overall transfer level: Modified independent Equipment used: (tripod cane)                      ADL either performed or assessed with clinical judgement   ADL Overall ADL's : Modified independent;At baseline             Vision Baseline Vision/History: Wears glasses Wears Glasses: At all times Patient Visual Report: No change from baseline              Pertinent Vitals/Pain Pain Assessment: No/denies pain     Hand Dominance Right   Extremity/Trunk Assessment Upper Extremity Assessment Upper Extremity Assessment: Overall WFL for tasks assessed(Mild LUE weakness noted. Close to baseline. LUE shoulder: 4+/5. slight decreased gross grasp in left hand)   Lower Extremity Assessment Lower Extremity Assessment: Defer to PT evaluation       Communication Communication Communication: No difficulties   Cognition Arousal/Alertness: Awake/alert Behavior During Therapy: WFL for tasks assessed/performed Overall Cognitive Status: Within Functional Limits for tasks assessed                    Home Living Family/patient expects to be discharged to:: Private residence   Available Help at Discharge: Family;Available PRN/intermittently(daughter calls/texts frequently during the day. daughter also assists PRN with tasks at home.) Type of Home: House Home Access: Stairs to enter [employer] of Steps: 1 Entrance Stairs-Rails: Right;Left(Can not reach both) Home Layout: One level     Bathroom Shower/Tub: Teacher, early years/pre: Standard Bathroom Accessibility: Yes  How Accessible: Accessible via walker Home Equipment: Grab bars - toilet;Grab bars - tub/shower;Bedside commode;Shower seat;Other (comment)(tripod cane)          Prior Functioning/Environment Level of Independence: Independent with assistive device(s)        Comments:  household ambulation without AD, uses tripod cane for short distanced community ambulation; independent in ADLs        OT Problem List: Decreased strength                       Co-evaluation PT/OT/SLP Co-Evaluation/Treatment: Yes Reason for Co-Treatment: To address functional/ADL transfers;Other (comment)(OT arrived at end of eval as PT was finishing)   OT goals addressed during session: ADL's and self-care;Strengthening/ROM      AM-PAC OT "6 Clicks" Daily Activity     Outcome Measure Help from another person eating meals?: None Help from another person taking care of personal grooming?: None Help from another person toileting, which includes using toliet, bedpan, or urinal?: None Help from another person bathing (including washing, rinsing, drying)?: A Little Help from another person to put on and taking off regular upper body clothing?: None Help from another person to put on and taking off regular lower body clothing?: None 6 Click Score: 23   End of Session Equipment Utilized During Treatment: Gait belt;Other (comment)(tripod cane)  Activity Tolerance: Patient tolerated treatment well Patient left: in chair;with call bell/phone within reach;with chair alarm set  OT Visit Diagnosis: Muscle weakness (generalized) (M62.81)                Time: [id number] OT Time Calculation (min): 10 min Charges:  OT General Charges $OT Visit: 1 Visit OT Evaluation $OT Eval Low Complexity: [place], OTR/L,CBIS  [phone]   Forestine Macho, Clarene Duke 03/01/2019, 9:26 AM

## 2019-03-01 NOTE — Plan of Care (Signed)
  Problem: Acute Rehab PT Goals(only PT should resolve) Goal: Patient Will Transfer Sit To/From Stand Outcome: Progressing Flowsheets (Taken 03/01/2019 0959) Patient will transfer sit to/from stand: with modified independence Goal: Pt Will Transfer Bed To Chair/Chair To Bed Outcome: Progressing Flowsheets (Taken 03/01/2019 0959) Pt will Transfer Bed to Chair/Chair to Bed: with modified independence Goal: Pt Will Ambulate Outcome: Progressing Flowsheets (Taken 03/01/2019 0959) Pt will Ambulate:  > 125 feet  with modified independence  10:00 AM, 03/01/19 Mearl Latin PT, DPT Physical Therapist at [facility]

## 2019-03-01 NOTE — Care Management Obs Status (Signed)
[place] NOTIFICATION   Patient Details  Name: [name] MRN: [id number] Date of Birth: [birth year]   Medicare Observation Status Notification Given:  Yes    Tommy Medal 03/01/2019, 3:49 PM

## 2019-03-01 NOTE — Discharge Summary (Signed)
Physician Discharge Summary  [name] [id number] DOB: 06-23-1934 DOA: 02/28/2019  PCP: Alycia Rossetti, MD  Admit date: 02/28/2019 Discharge date: 03/02/2019  Admitted From: Home Disposition:  Home  Recommendations for Outpatient Follow-up:  1. Follow up with PCP in 1-2 weeks 2. Please obtain BMP/CBC in one week   Home Health: YES Equipment/Devices: HHPT  Discharge Condition: Stable CODE STATUS: DNR Diet recommendation: Heart Healthy   Brief/Interim Summary: 84 year old female with a history of TIA, hyperlipidemia, hypertension, and pulmonary embolus presenting with left facial dysesthesias and left-sided weakness. The patient states that she has some residual left-sided weakness from a previous stroke, but stated that this was slightly worse than usual. She denied any visual disturbance, dysarthria, dysphasia. Notably, the patient states that she was taken off her apixaban after her hospitalization in September 2020 when she was noted to have some microhemorrhages in the right frontal lobe. She has been taking aspirin 325 mg since then. She denies any fevers, chills, headache, visual disturbance, chest pain, shortness breath, nausea, vomiting, diarrhea, abdominal pain, dizziness, syncope. Since admission, the patient states that her dysesthesia and left-sided weakness are improving. In the emergency department, the patient was afebrile hemodynamically stable saturating 100% room air. BMP, LFTs, and CBC were unremarkable. CT of the brain did not show any acute intracranial abnormalities. Urinalysis was negative for significant pyuria. The patient was admitted for further evaluation and treatment.  Neurology was consulted to assist.  Discharge Diagnoses:   Sensory disturbance/left-sided weakness -Appreciate Neurology Consult-->felt pt had complex partial seizure-->started keppra -no further seizure like activity during hospitalization -PT/OT  evaluation-->HHPT -Speech therapy eval--reg diet -CT brain--neg -MRI brain--neg for acute findings since Aug 2020 -Carotid Duplex--neg for hemodynamically significant stenosis -Echo--EF 7-75%, no WMA, G1DD -LDL--81 -HbA1C--6.5 -Antiplatelet--ASA 325 mg daily  CKD stage III -Baseline creatinine 1.2-1.5 -serum creatinine 1.30 at time ofd/c  Hyperlipidemia -Continue statin -LDL 81  Essential hypertension -Holding amlodipine and metoprolol to allow for permissive hypertension--restart after d/c  Diabetes Mellitus type 2 -HbA1C--6.5 -lifestyle modification for now -follow up with PCP    Discharge Instructions   Allergies as of 03/02/2019   No Known Allergies     Medication List    STOP taking these medications   Ascriptin 325 MG Tabs     TAKE these medications   acetaminophen 500 MG tablet Commonly known as: TYLENOL Take 1,000 mg by mouth every 8 (eight) hours as needed (cold).   amLODipine 5 MG tablet Commonly known as: NORVASC Take 1 tablet (5 mg total) by mouth daily.   ASPERCREME LIDOCAINE EX Apply 1 application topically daily as needed.   aspirin 325 MG tablet Take 325 mg by mouth daily.   atorvastatin 40 MG tablet Commonly known as: LIPITOR Take 2 tablets (80 mg total) by mouth daily.   cholecalciferol 25 MCG (1000 UT) tablet Commonly known as: VITAMIN D3 Take 1,000 Units by mouth daily.   gabapentin 300 MG capsule Commonly known as: NEURONTIN Take 1 in the morning and 1 in the evening What changed:   how much to take  how to take this  when to take this  additional instructions   levETIRAcetam 250 MG tablet Commonly known as: KEPPRA Take 1 tablet (250 mg total) by mouth 2 (two) times daily.   linaclotide 72 MCG capsule Commonly known as: Linzess TAKE 1 CAPSULE BY MOUTH DAILY BEFORE BREAKFAST. What changed:   how much to take  how to take this  when to take this  additional instructions  metoprolol tartrate 25 MG  tablet Commonly known as: LOPRESSOR Take 1.5 tablets (37.5 mg total) by mouth 2 (two) times daily.   Restasis 0.05 % ophthalmic emulsion Generic drug: cycloSPORINE Place 1 drop into both eyes 2 (two) times daily as needed (chronic dry eye).   traMADol 50 MG tablet Commonly known as: ULTRAM Take 50 mg by mouth every 8 (eight) hours as needed.       No Known Allergies  Consultations:  neurology   Procedures/Studies: MR BRAIN WO CONTRAST  Result Date: 03/01/2019 CLINICAL DATA:  84 year old female with left extremity weakness and left face paresthesia. Code stroke presentation yesterday. EXAM: MRI HEAD WITHOUT CONTRAST TECHNIQUE: Multiplanar, multiecho pulse sequences of the brain and surrounding structures were obtained without intravenous contrast. COMPARISON:  Plain head CT yesterday. Brain MRI 11/26/2018 report (images not available at this time). Brain MRI 11/06/2018. FINDINGS: Brain: No restricted diffusion or evidence of acute infarction. Severe chronic T2 signal abnormality in the bilateral deep gray matter nuclei. Moderate similar chronic changes in the pons. Scattered chronic microhemorrhages, mostly in the thalami, occasionally in the cerebellum and cerebrum. Confluent bilateral cerebral white matter T2 and FLAIR hyperintensity. No cortical encephalomalacia or new signal abnormality identified. No midline shift, mass effect, evidence of mass lesion, ventriculomegaly, extra-axial collection or acute intracranial hemorrhage. Cervicomedullary junction and pituitary are within normal limits. Vascular: Major intracranial vascular flow voids are stable. Generalized intracranial artery tortuosity. Skull and upper cervical spine: Negative visible cervical spine. Visualized bone marrow signal is within normal limits. Mild asymmetric hyperostosis of the left frontal bone again noted (series 9, image 31). Sinuses/Orbits: Negative; mild Disconjugate gaze again noted. Other: Mastoids remain  clear. Negative visible internal auditory structures. Scalp and face soft tissues appear negative. IMPRESSION: 1. Severe chronic small vessel disease with no acute infarct identified. 2. No new intracranial abnormality since August. Electronically Signed   By: Genevie Ann M.D.   On: 03/01/2019 10:53   US Carotid Bilateral (at [facility] and [facility] only)  Result Date: 03/01/2019 CLINICAL DATA:  Left facial numbness x1 week. Hypertension, syncope, TIA. EXAM: BILATERAL CAROTID DUPLEX ULTRASOUND TECHNIQUE: Pearline Cables scale imaging, color Doppler and duplex ultrasound were performed of bilateral carotid and vertebral arteries in the neck. COMPARISON:  08/20/2018 FINDINGS: Criteria: Quantification of carotid stenosis is based on velocity parameters that correlate the residual internal carotid diameter with NASCET-based stenosis levels, using the diameter of the distal internal carotid lumen as the denominator for stenosis measurement. The following velocity measurements were obtained: RIGHT ICA: 76/17 cm/sec CCA: [id number] cm/sec SYSTOLIC ICA/CCA RATIO:  [id number] ECA: 91 cm/sec LEFT ICA: 90/18 cm/sec CCA: [id number] cm/sec SYSTOLIC ICA/CCA RATIO:  1.0 ECA: 57 cm/sec RIGHT CAROTID ARTERY: Smooth noncalcified plaque in the carotid bulb extending to the ICA origin. No high-grade stenosis. Normal waveforms and color Doppler signal. Mild tortuosity. RIGHT VERTEBRAL ARTERY:  Normal flow direction and waveform. LEFT CAROTID ARTERY: Eccentric plaque in the bulb and proximal ICA. No high-grade stenosis. Mild tortuosity. Normal waveforms and color Doppler signal. LEFT VERTEBRAL ARTERY:  Normal flow direction and waveform. IMPRESSION: 1. Bilateral carotid bifurcation plaque resulting in less than 50% diameter ICA stenosis. 2. Antegrade bilateral vertebral arterial flow. Electronically Signed   By: Lucrezia Europe M.D.   On: 03/01/2019 12:29   ECHOCARDIOGRAM COMPLETE  Result Date: 03/01/2019   ECHOCARDIOGRAM REPORT   Patient Name:   [name] Date  of Exam: 03/01/2019 Medical Rec #:  [id number]  Height: Accession #:    [id number]             Weight: Date of Birth:  12/05/1934              BSA: Patient Age:    54 years               BP:           146/65 mmHg Patient Gender: F                      HR:           63 bpm. Exam Location:  [facility] Procedure: 2D Echo Indications:    stroke  History:        Patient has prior history of Echocardiogram examinations, most                 recent 11/22/2015. Stroke; Risk Factors:Hypertension and                 Dyslipidemia. PE.  Sonographer:    Jannett Celestine RDCS (AE) Referring Phys: [id number] Marium Ragan IMPRESSIONS  1. Left ventricular ejection fraction, by visual estimation, is 70 to 75%. The left ventricle has hyperdynamic function. There is mildly increased left ventricular hypertrophy.  2. Left ventricular diastolic parameters are consistent with Grade I diastolic dysfunction (impaired relaxation).  3. The left ventricle has no regional wall motion abnormalities.  4. Global right ventricle has normal systolic function.The right ventricular size is normal. No increase in right ventricular wall thickness.  5. Left atrial size was normal.  6. Right atrial size was normal.  7. Mild mitral annular calcification.  8. The mitral valve is abnormal. Systolic bowing with possible subleaflet prolapse involving posterior leaflet. Trivial mitral valve regurgitation.  9. The tricuspid valve is grossly normal. Tricuspid valve regurgitation is mild. 10. The aortic valve is grossly normal. Aortic valve regurgitation is not visualized. 11. The pulmonic valve was grossly normal. Pulmonic valve regurgitation is trivial. 12. Moderately elevated pulmonary artery systolic pressure. 13. The tricuspid regurgitant velocity is 2.91 m/s, and with an assumed right atrial pressure of 10 mmHg, the estimated right ventricular systolic pressure is moderately elevated at 43.9 mmHg. FINDINGS  Left Ventricle: Left ventricular ejection fraction, by  visual estimation, is 70 to 75%. The left ventricle has hyperdynamic function. The left ventricle has no regional wall motion abnormalities. The left ventricular internal cavity size was the left ventricle is normal in size. There is mildly increased left ventricular hypertrophy. Left ventricular diastolic parameters are consistent with Grade I diastolic dysfunction (impaired relaxation). Normal left atrial pressure. Right Ventricle: The right ventricular size is normal. No increase in right ventricular wall thickness. Global RV systolic function is has normal systolic function. The tricuspid regurgitant velocity is 2.91 m/s, and with an assumed right atrial pressure  of 10 mmHg, the estimated right ventricular systolic pressure is moderately elevated at 43.9 mmHg. Left Atrium: Left atrial size was normal in size. Right Atrium: Right atrial size was normal in size Pericardium: There is no evidence of pericardial effusion. Mitral Valve: The mitral valve is abnormal. Mild mitral annular calcification. Trivial mitral valve regurgitation. Tricuspid Valve: The tricuspid valve is grossly normal. Tricuspid valve regurgitation is mild. Aortic Valve: The aortic valve is grossly normal. Aortic valve regurgitation is not visualized. Mild aortic valve annular calcification. Pulmonic Valve: The pulmonic valve was grossly normal. Pulmonic valve regurgitation is trivial. Pulmonic regurgitation is trivial. Aorta: The aortic root  is normal in size and structure. IAS/Shunts: No atrial level shunt detected by color flow Doppler.  LEFT VENTRICLE PLAX 2D LVIDd:         4.13 cm LVIDs:         2.27 cm LV PW:         1.12 cm LV IVS:        1.01 cm LVOT diam:     1.90 cm LV SV:         58 ml LVOT Area:     2.84 cm  LEFT ATRIUM LA diam:      2.60 cm LA Vol (A2C): 22.0 ml LA Vol (A4C): 30.0 ml  AORTIC VALVE LVOT VTI:    18.000 m MV E velocity: 50.00 cm/s   103 cm/s  TRICUSPID VALVE MV A velocity: 8200.00 cm/s 70.3 cm/s TR Peak grad:   33.9  mmHg MV E/A ratio:  0.01         1.5       TR Vmax:        291.00 cm/s                                        SHUNTS                                       Systemic VTI:  18.00 m                                       Systemic Diam: 1.90 cm  Rozann Lesches MD Electronically signed by Rozann Lesches MD Signature Date/Time: 03/01/2019/3:54:06 PM    Final    CT HEAD CODE STROKE WO CONTRAST  Result Date: 02/28/2019 CLINICAL DATA:  Code stroke. Left-sided weakness and numbness beginning 1730 hours. EXAM: CT HEAD WITHOUT CONTRAST TECHNIQUE: Contiguous axial images were obtained from the base of the skull through the vertex without intravenous contrast. COMPARISON:  12/27/2018 FINDINGS: Brain: Generalized brain atrophy. Extensive chronic small-vessel ischemic changes throughout the cerebral hemispheric white matter, basal ganglia, thalami and pons. No identifiable acute infarction, mass lesion, hemorrhage, hydrocephalus or extra-axial collection. Vascular: There is atherosclerotic calcification of the major vessels at the base of the brain. Skull: Negative Sinuses/Orbits: Clear/normal Other: None ASPECTS ([place] Stroke Program Early CT Score) - Ganglionic level infarction (caudate, lentiform nuclei, internal capsule, insula, M1-M3 cortex): 7 - Supraganglionic infarction (M4-M6 cortex): 3 Total score (0-10 with 10 being normal): 10 IMPRESSION: 1. No acute finding by CT. Advanced chronic small-vessel ischemic changes throughout as described. 2. ASPECTS is 10. 3. These results were called by telephone at the time of interpretation on 02/28/2019 at 9:53 pm to provider JOSEPH ZAMMIT , who verbally acknowledged these results. Electronically Signed   By: Nelson Chimes M.D.   On: 02/28/2019 21:54        Discharge Exam: Vitals:   03/02/19 0711 03/02/19 1324  BP: 123/71 120/64  Pulse: 63 65  Resp: 17 17  Temp: 98.1 F (36.7 C) 98.5 F (36.9 C)  SpO2: 99% 96%   Vitals:   03/01/19 2332 03/02/19 0328 03/02/19  0711 03/02/19 1324  BP: 112/69 130/76 123/71 120/64  Pulse: 61 64 63 65  Resp: 17 17 17  17  Temp: 98.4 F (36.9 C) 98 F (36.7 C) 98.1 F (36.7 C) 98.5 F (36.9 C)  TempSrc: Oral Oral  Oral  SpO2: 96% 100% 99% 96%  Weight:      Height:        General: Pt is alert, awake, not in acute distress Cardiovascular: RRR, S1/S2 +, no rubs, no gallops Respiratory: CTA bilaterally, no wheezing, no rhonchi Abdominal: Soft, NT, ND, bowel sounds + Extremities: no edema, no cyanosis   The results of significant diagnostics from this hospitalization (including imaging, microbiology, ancillary and laboratory) are listed below for reference.    Significant Diagnostic Studies: MR BRAIN WO CONTRAST  Result Date: 03/01/2019 CLINICAL DATA:  84 year old female with left extremity weakness and left face paresthesia. Code stroke presentation yesterday. EXAM: MRI HEAD WITHOUT CONTRAST TECHNIQUE: Multiplanar, multiecho pulse sequences of the brain and surrounding structures were obtained without intravenous contrast. COMPARISON:  Plain head CT yesterday. Brain MRI 11/26/2018 report (images not available at this time). Brain MRI 11/06/2018. FINDINGS: Brain: No restricted diffusion or evidence of acute infarction. Severe chronic T2 signal abnormality in the bilateral deep gray matter nuclei. Moderate similar chronic changes in the pons. Scattered chronic microhemorrhages, mostly in the thalami, occasionally in the cerebellum and cerebrum. Confluent bilateral cerebral white matter T2 and FLAIR hyperintensity. No cortical encephalomalacia or new signal abnormality identified. No midline shift, mass effect, evidence of mass lesion, ventriculomegaly, extra-axial collection or acute intracranial hemorrhage. Cervicomedullary junction and pituitary are within normal limits. Vascular: Major intracranial vascular flow voids are stable. Generalized intracranial artery tortuosity. Skull and upper cervical spine: Negative  visible cervical spine. Visualized bone marrow signal is within normal limits. Mild asymmetric hyperostosis of the left frontal bone again noted (series 9, image 31). Sinuses/Orbits: Negative; mild Disconjugate gaze again noted. Other: Mastoids remain clear. Negative visible internal auditory structures. Scalp and face soft tissues appear negative. IMPRESSION: 1. Severe chronic small vessel disease with no acute infarct identified. 2. No new intracranial abnormality since August. Electronically Signed   By: Genevie Ann M.D.   On: 03/01/2019 10:53   US Carotid Bilateral (at [facility] and [facility] only)  Result Date: 03/01/2019 CLINICAL DATA:  Left facial numbness x1 week. Hypertension, syncope, TIA. EXAM: BILATERAL CAROTID DUPLEX ULTRASOUND TECHNIQUE: Pearline Cables scale imaging, color Doppler and duplex ultrasound were performed of bilateral carotid and vertebral arteries in the neck. COMPARISON:  08/20/2018 FINDINGS: Criteria: Quantification of carotid stenosis is based on velocity parameters that correlate the residual internal carotid diameter with NASCET-based stenosis levels, using the diameter of the distal internal carotid lumen as the denominator for stenosis measurement. The following velocity measurements were obtained: RIGHT ICA: 76/17 cm/sec CCA: [id number] cm/sec SYSTOLIC ICA/CCA RATIO:  [id number] ECA: 91 cm/sec LEFT ICA: 90/18 cm/sec CCA: [id number] cm/sec SYSTOLIC ICA/CCA RATIO:  1.0 ECA: 57 cm/sec RIGHT CAROTID ARTERY: Smooth noncalcified plaque in the carotid bulb extending to the ICA origin. No high-grade stenosis. Normal waveforms and color Doppler signal. Mild tortuosity. RIGHT VERTEBRAL ARTERY:  Normal flow direction and waveform. LEFT CAROTID ARTERY: Eccentric plaque in the bulb and proximal ICA. No high-grade stenosis. Mild tortuosity. Normal waveforms and color Doppler signal. LEFT VERTEBRAL ARTERY:  Normal flow direction and waveform. IMPRESSION: 1. Bilateral carotid bifurcation plaque resulting in less than 50% diameter ICA  stenosis. 2. Antegrade bilateral vertebral arterial flow. Electronically Signed   By: Lucrezia Europe M.D.   On: 03/01/2019 12:29   ECHOCARDIOGRAM COMPLETE  Result Date: 03/01/2019   ECHOCARDIOGRAM REPORT   Patient Name:  [name] Date of Exam: 03/01/2019 Medical Rec #:  [id number]              Height: Accession #:    [id number]             Weight: Date of Birth:  1934-12-18              BSA: Patient Age:    16 years               BP:           146/65 mmHg Patient Gender: F                      HR:           63 bpm. Exam Location:  [facility] Procedure: 2D Echo Indications:    stroke  History:        Patient has prior history of Echocardiogram examinations, most                 recent 11/22/2015. Stroke; Risk Factors:Hypertension and                 Dyslipidemia. PE.  Sonographer:    Jannett Celestine RDCS (AE) Referring Phys: [phone] Lilo Wallington IMPRESSIONS  1. Left ventricular ejection fraction, by visual estimation, is 70 to 75%. The left ventricle has hyperdynamic function. There is mildly increased left ventricular hypertrophy.  2. Left ventricular diastolic parameters are consistent with Grade I diastolic dysfunction (impaired relaxation).  3. The left ventricle has no regional wall motion abnormalities.  4. Global right ventricle has normal systolic function.The right ventricular size is normal. No increase in right ventricular wall thickness.  5. Left atrial size was normal.  6. Right atrial size was normal.  7. Mild mitral annular calcification.  8. The mitral valve is abnormal. Systolic bowing with possible subleaflet prolapse involving posterior leaflet. Trivial mitral valve regurgitation.  9. The tricuspid valve is grossly normal. Tricuspid valve regurgitation is mild. 10. The aortic valve is grossly normal. Aortic valve regurgitation is not visualized. 11. The pulmonic valve was grossly normal. Pulmonic valve regurgitation is trivial. 12. Moderately elevated pulmonary artery systolic pressure. 13.  The tricuspid regurgitant velocity is 2.91 m/s, and with an assumed right atrial pressure of 10 mmHg, the estimated right ventricular systolic pressure is moderately elevated at 43.9 mmHg. FINDINGS  Left Ventricle: Left ventricular ejection fraction, by visual estimation, is 70 to 75%. The left ventricle has hyperdynamic function. The left ventricle has no regional wall motion abnormalities. The left ventricular internal cavity size was the left ventricle is normal in size. There is mildly increased left ventricular hypertrophy. Left ventricular diastolic parameters are consistent with Grade I diastolic dysfunction (impaired relaxation). Normal left atrial pressure. Right Ventricle: The right ventricular size is normal. No increase in right ventricular wall thickness. Global RV systolic function is has normal systolic function. The tricuspid regurgitant velocity is 2.91 m/s, and with an assumed right atrial pressure  of 10 mmHg, the estimated right ventricular systolic pressure is moderately elevated at 43.9 mmHg. Left Atrium: Left atrial size was normal in size. Right Atrium: Right atrial size was normal in size Pericardium: There is no evidence of pericardial effusion. Mitral Valve: The mitral valve is abnormal. Mild mitral annular calcification. Trivial mitral valve regurgitation. Tricuspid Valve: The tricuspid valve is grossly normal. Tricuspid valve regurgitation is mild. Aortic Valve: The aortic valve is grossly normal. Aortic valve regurgitation is not visualized. Mild  aortic valve annular calcification. Pulmonic Valve: The pulmonic valve was grossly normal. Pulmonic valve regurgitation is trivial. Pulmonic regurgitation is trivial. Aorta: The aortic root is normal in size and structure. IAS/Shunts: No atrial level shunt detected by color flow Doppler.  LEFT VENTRICLE PLAX 2D LVIDd:         4.13 cm LVIDs:         2.27 cm LV PW:         1.12 cm LV IVS:        1.01 cm LVOT diam:     1.90 cm LV SV:         58 ml  LVOT Area:     2.84 cm  LEFT ATRIUM LA diam:      2.60 cm LA Vol (A2C): 22.0 ml LA Vol (A4C): 30.0 ml  AORTIC VALVE LVOT VTI:    18.000 m MV E velocity: 50.00 cm/s   103 cm/s  TRICUSPID VALVE MV A velocity: 8200.00 cm/s 70.3 cm/s TR Peak grad:   33.9 mmHg MV E/A ratio:  0.01         1.5       TR Vmax:        291.00 cm/s                                        SHUNTS                                       Systemic VTI:  18.00 m                                       Systemic Diam: 1.90 cm  Rozann Lesches MD Electronically signed by Rozann Lesches MD Signature Date/Time: 03/01/2019/3:54:06 PM    Final    CT HEAD CODE STROKE WO CONTRAST  Result Date: 02/28/2019 CLINICAL DATA:  Code stroke. Left-sided weakness and numbness beginning 1730 hours. EXAM: CT HEAD WITHOUT CONTRAST TECHNIQUE: Contiguous axial images were obtained from the base of the skull through the vertex without intravenous contrast. COMPARISON:  12/27/2018 FINDINGS: Brain: Generalized brain atrophy. Extensive chronic small-vessel ischemic changes throughout the cerebral hemispheric white matter, basal ganglia, thalami and pons. No identifiable acute infarction, mass lesion, hemorrhage, hydrocephalus or extra-axial collection. Vascular: There is atherosclerotic calcification of the major vessels at the base of the brain. Skull: Negative Sinuses/Orbits: Clear/normal Other: None ASPECTS ([place] Stroke Program Early CT Score) - Ganglionic level infarction (caudate, lentiform nuclei, internal capsule, insula, M1-M3 cortex): 7 - Supraganglionic infarction (M4-M6 cortex): 3 Total score (0-10 with 10 being normal): 10 IMPRESSION: 1. No acute finding by CT. Advanced chronic small-vessel ischemic changes throughout as described. 2. ASPECTS is 10. 3. These results were called by telephone at the time of interpretation on 02/28/2019 at 9:53 pm to provider JOSEPH ZAMMIT , who verbally acknowledged these results. Electronically Signed   By: Nelson Chimes M.D.    On: 02/28/2019 21:54     Microbiology: Recent Results (from the past 240 hour(s))  SARS CORONAVIRUS 2 (Meryle Pugmire 6-24 HRS) Nasopharyngeal Nasopharyngeal Swab     Status: None   Collection Time: 02/28/19 10:33 PM   Specimen: Nasopharyngeal Swab  Result Value Ref Range Status   SARS  Coronavirus 2 NEGATIVE NEGATIVE Final    Comment: (NOTE) SARS-CoV-2 target nucleic acids are NOT DETECTED. The SARS-CoV-2 RNA is generally detectable in upper and lower respiratory specimens during the acute phase of infection. Negative results do not preclude SARS-CoV-2 infection, do not rule out co-infections with other pathogens, and should not be used as the sole basis for treatment or other patient management decisions. Negative results must be combined with clinical observations, patient history, and epidemiological information. The expected result is Negative. Fact Sheet for Patients: SugarRoll.be Fact Sheet for Healthcare Providers: https://www.woods-mathews.com/ This test is not yet approved or cleared by the [place] FDA and  has been authorized for detection and/or diagnosis of SARS-CoV-2 by FDA under an Emergency Use Authorization (EUA). This EUA will remain  in effect (meaning this test can be used) for the duration of the COVID-19 declaration under Section 56 4(b)(1) of the Act, 21 U.S.C. section 360bbb-3(b)(1), unless the authorization is terminated or revoked sooner. Performed at [facility], [place] [address], [place], [place] [address]      Labs: Basic Metabolic Panel: Recent Labs  Lab 02/28/19 2133  NA 141  K 3.7  CL 103  CO2 28  GLUCOSE 96  BUN 22  CREATININE 1.30*  CALCIUM 9.8   Liver Function Tests: Recent Labs  Lab 02/28/19 2133  AST 21  ALT 23  ALKPHOS 99  BILITOT 0.9  PROT 7.9  ALBUMIN 4.1   No results for input(s): LIPASE, AMYLASE in the last 168 hours. No results for input(s): AMMONIA in the last 168  hours. CBC: Recent Labs  Lab 02/28/19 2133  WBC 5.8  NEUTROABS 2.5  HGB 13.7  HCT 43.2  MCV 101.4*  PLT 287   Cardiac Enzymes: No results for input(s): CKTOTAL, CKMB, CKMBINDEX, TROPONINI in the last 168 hours. BNP: Invalid input(s): POCBNP CBG: Recent Labs  Lab 02/28/19 2158  GLUCAP 89    Time coordinating discharge:  36 minutes  Signed:  Orson Eva, DO [facility] Pager: [phone] 03/02/2019, 1:28 PM

## 2019-03-01 NOTE — Progress Notes (Signed)
  Echocardiogram 2D Echocardiogram has been performed.  [name] 03/01/2019, 11:56 AM

## 2019-03-01 NOTE — Progress Notes (Signed)
SLP Cancellation Note  Patient Details Name: [name] MRN: [id number] DOB: Nov 11, 1934   Cancelled treatment:       Reason Eval/Treat Not Completed: SLP screened, no needs identified, will sign off. SLE was completed 11/2018 and her cognitive linguistic skills were noted to be WNL. Pt presents today without change. Thank you for this referral,  Jordis Repetto H. Roddie Mc, CCC-SLP Speech Language Pathologist    Wende Bushy 03/01/2019, 1:42 PM

## 2019-03-01 NOTE — Progress Notes (Signed)
Code stroke Call time  940pm Beeper   945 Exam started  [id number] [place] called  [phone]

## 2019-03-01 NOTE — Consult Note (Signed)
[place] A. Merlene Laughter, MD     [email or web address]          [name] is an 84 y.o. female.   ASSESSMENT/PLAN: 1.  This patient spell is most consistent with complex partial seizure: The patient will be started on Keppra.  She has a follow-up in the office in a few days and should keep this appointment.  TIA is a possibility but I think this is less likely given her history and previous events. 2.  Remote infarcts with microhemorrhage transformation.  Continue with aspirin. 3.  Chronic pain syndrome    The patient is an 84 year old right-handed black female who presents with the acute onset of numbness and tingling involving the left facial region of the upper extremity.  This was associated with shaking of the left upper extremity.  The shaking lasted for about half an hour and the numbness for an hour.  She does not report having symptoms involving the left lower extremity.  She denies any chest pain, shortness of breath, dysarthria or dysphagia.  No dizziness is reported.  There is no loss of consciousness.  The patient had an event couple months ago where she became unresponsive for a couple of minutes.  The spell was witnessed by her daughter.  The daughter reports that she appears to stiffen up with some shaking of the upper extremities.  She was taken to the hospital and had another evaluation at this time which was mostly unrevealing.  She does have a history of prior stroke with left-sided numbness, tingling and weakness.  She also has chronic pain syndrome thought to be related to her previous stroke.  The review of systems otherwise negative.  GENERAL: This is a pleasant female who is doing well at this time.  HEENT: The neck is supple no trauma appreciated.  ABDOMEN: soft  EXTREMITIES: No edema   BACK: Normal  SKIN: Normal by inspection.    MENTAL STATUS: Alert and oriented -including her age and the month. Speech, language and cognition are  generally intact. Judgment and insight normal.   CRANIAL NERVES: Pupils are equal, round and reactive to light and accomodation; extra ocular movements are full, there is no significant nystagmus; visual fields are full; upper and lower facial muscles are normal in strength and symmetric, there is no flattening of the nasolabial folds; tongue is midline; uvula is midline; shoulder elevation is normal.  MOTOR: Normal tone, bulk and strength; no pronator drift.  COORDINATION: Left finger to nose is normal, right finger to nose is normal, No rest tremor; no intention tremor; no postural tremor; no bradykinesia.  REFLEXES: Deep tendon reflexes are symmetrical and normal.   SENSATION: Normal to light touch, temperature, and pain.  GAIT: Normal.    NIH stroke scale 0.  Blood pressure 125/71, pulse 64, temperature 98.6 F (37 C), temperature source Oral, resp. rate 16, height 5\' 4"  (1.626 m), weight 70.4 kg, SpO2 100 %.  Past Medical History:  Diagnosis Date  . Allergy   . Arthritis   . Colon cancer ([place])    colon ca dx 07/30/09  . History of cardiac monitoring 07/2017   "Event monitor demonstrated sinus rhythm with isolated PACs and no arrhythmias"  . History of colon cancer 06/2009   found at time of TCS 06/29/09, 1.2cm sessile cecal polyp, no adjuvent therapy needed  . HTN (hypertension)   . Hx of cardiovascular stress test 07/2017   "No diagnostic ST segment changes to indicate ischemia. Small, moderate  intensity, reversible apical to basal inferolateral defect consistent with ischemia. This is a low risk study. Nuclear stress EF: 84%."  . Hyperlipidemia   . Hypothyroidism   . PE (pulmonary thromboembolism) ([place])   . Renal disorder    cyst on kidney   . Stroke ([place])   . Vertigo     Past Surgical History:  Procedure Laterality Date  . ABDOMINAL HYSTERECTOMY    . COLON SURGERY  07/2009   right hemicolectomy, no residual colon cancer on path  . COLONOSCOPY  07/16/2010    [id number] POLYP-TCS 3 YEARS  . COLONOSCOPY N/A 08/02/2013   hyperplastic polyps, surveillance in 2020 if benefits outweight the risks  . COLONOSCOPY  06/2009   1.2 cm sessile cecal polyp which had adenocarcinoma arising in a tubular adenoma.  [place] Kitchen PARTIAL THYMECTOMY    . partial thyroidectomy     benign tumors    Family History  Problem Relation Age of Onset  . Colon cancer Mother        >age60  . Arthritis Mother   . Cancer Mother   . Heart disease Mother   . Hyperlipidemia Mother   . Hypertension Mother   . Heart attack Father   . Heart disease Father   . Diabetes Maternal Aunt   . Hyperlipidemia Daughter   . Hypertension Daughter   . Liver disease Neg Hx     Social History:  reports that she has never smoked. She has never used smokeless tobacco. She reports that she does not drink alcohol or use drugs.  Allergies: No Known Allergies  Medications: Prior to Admission medications   Medication Sig Start Date End Date Taking? Authorizing Provider  amLODipine (NORVASC) 5 MG tablet Take 1 tablet (5 mg total) by mouth daily. 12/05/18  Yes [place], Modena Nunnery, MD  atorvastatin (LIPITOR) 40 MG tablet Take 2 tablets (80 mg total) by mouth daily. Patient taking differently: Take 40 mg by mouth daily.  08/22/18  Yes [place], Modena Nunnery, MD  linaclotide (LINZESS) 72 MCG capsule TAKE 1 CAPSULE BY MOUTH DAILY BEFORE BREAKFAST. Patient taking differently: Take 72 mcg by mouth daily as needed (constipation).  02/28/18  Yes [place], Modena Nunnery, MD  metoprolol tartrate (LOPRESSOR) 25 MG tablet Take 1.5 tablets (37.5 mg total) by mouth 2 (two) times daily. 11/13/18  Yes [place], [place] M, PA-C  traMADol (ULTRAM) 50 MG tablet Take 50 mg by mouth every 8 (eight) hours as needed. 01/10/19  Yes [provider]  acetaminophen (TYLENOL) 500 MG tablet Take 1,000 mg by mouth every 8 (eight) hours as needed (cold).    [provider]  Aspirin Buf,AlHyd-MgHyd-CaCar, (ASCRIPTIN) 325 MG  TABS Take 1 tablet by mouth daily. 01/04/19   Alycia Rossetti, MD  cholecalciferol (VITAMIN D3) 25 MCG (1000 UT) tablet Take 1,000 Units by mouth daily.     [provider]  gabapentin (NEURONTIN) 300 MG capsule Take 1 in the morning and 1 in the evening Patient taking differently: Take 300 mg by mouth 2 (two) times daily.  10/26/18   [place], Modena Nunnery, MD  RESTASIS 0.05 % ophthalmic emulsion Place 1 drop into both eyes 2 (two) times daily as needed (chronic dry eye).  05/17/10   [provider]    Scheduled Meds: . aspirin  325 mg Oral Daily  . atorvastatin  80 mg Oral Daily  . cholecalciferol  1,000 Units Oral Daily  . gabapentin  300 mg Oral BID  . heparin  5,000 Units Subcutaneous Q8H  Continuous Infusions: PRN Meds:.acetaminophen **OR** acetaminophen (TYLENOL) oral liquid 160 mg/5 mL **OR** acetaminophen, hydrALAZINE, linaclotide     Results for orders placed or performed during the hospital encounter of 02/28/19 (from the past 48 hour(s))  Ethanol     Status: None   Collection Time: 02/28/19  9:33 PM  Result Value Ref Range   Alcohol, Ethyl (B) <10 <10 mg/dL    Comment: (NOTE) Lowest detectable limit for serum alcohol is 10 mg/dL. For medical purposes only. Performed at [facility], [address], [place], [place] [address]   Protime-INR     Status: None   Collection Time: 02/28/19  9:33 PM  Result Value Ref Range   Prothrombin Time 13.2 11.4 - 15.2 seconds   INR 1.0 0.8 - 1.2    Comment: (NOTE) INR goal varies based on device and disease states. Performed at [facility], [address], [place], [place] [address]   APTT     Status: None   Collection Time: 02/28/19  9:33 PM  Result Value Ref Range   aPTT 30 24 - 36 seconds    Comment: Performed at [facility], [address], [place], [place] [address]  CBC     Status: Abnormal   Collection Time: 02/28/19  9:33 PM  Result Value Ref Range   WBC 5.8 4.0 - 10.5 K/uL   RBC 4.26 3.87 - 5.11 MIL/uL    Hemoglobin 13.7 12.0 - 15.0 g/dL   HCT 43.2 36.0 - 46.0 %   MCV 101.4 (H) 80.0 - 100.0 fL   MCH 32.2 26.0 - 34.0 pg   MCHC 31.7 30.0 - 36.0 g/dL   RDW 14.3 11.5 - 15.5 %   Platelets 287 150 - 400 K/uL   nRBC 0.0 0.0 - 0.2 %    Comment: Performed at [facility], [address] [place], [place] [address]  Differential     Status: None   Collection Time: 02/28/19  9:33 PM  Result Value Ref Range   Neutrophils Relative % 44 %   Neutro Abs 2.5 1.7 - 7.7 K/uL   Lymphocytes Relative 42 %   Lymphs Abs 2.4 0.7 - 4.0 K/uL   Monocytes Relative 9 %   Monocytes Absolute 0.5 0.1 - 1.0 K/uL   Eosinophils Relative 4 %   Eosinophils Absolute 0.2 0.0 - 0.5 K/uL   Basophils Relative 1 %   Basophils Absolute 0.0 0.0 - 0.1 K/uL   Immature Granulocytes 0 %   Abs Immature Granulocytes 0.01 0.00 - 0.07 K/uL    Comment: Performed at [facility], [address], [place],  [address]  Comprehensive metabolic panel     Status: Abnormal   Collection Time: 02/28/19  9:33 PM  Result Value Ref Range   Sodium 141 135 - 145 mmol/L   Potassium 3.7 3.5 - 5.1 mmol/L   Chloride 103 98 - 111 mmol/L   CO2 28 22 - 32 mmol/L   Glucose, Bld 96 70 - 99 mg/dL   BUN 22 8 - 23 mg/dL   Creatinine, Ser 1.30 (H) 0.44 - 1.00 mg/dL   Calcium 9.8 8.9 - 10.3 mg/dL   Total Protein 7.9 6.5 - 8.1 g/dL   Albumin 4.1 3.5 - 5.0 g/dL   AST 21 15 - 41 U/L   ALT 23 0 - 44 U/L   Alkaline Phosphatase 99 38 - 126 U/L   Total Bilirubin 0.9 0.3 - 1.2 mg/dL   GFR calc non Af Amer 38 (L) >60  mL/min   GFR calc Af Amer 44 (L) >60 mL/min   Anion gap 10 5 - 15    Comment: Performed at [facility], [address], [place], [place] [address]  Urine rapid drug screen (hosp performed)     Status: None   Collection Time: 02/28/19  9:40 PM  Result Value Ref Range   Opiates NONE DETECTED NONE DETECTED   Cocaine NONE DETECTED NONE DETECTED   Benzodiazepines NONE DETECTED NONE DETECTED   Amphetamines NONE DETECTED NONE DETECTED    Tetrahydrocannabinol NONE DETECTED NONE DETECTED   Barbiturates NONE DETECTED NONE DETECTED    Comment: (NOTE) DRUG SCREEN FOR MEDICAL PURPOSES ONLY.  IF CONFIRMATION IS NEEDED FOR ANY PURPOSE, NOTIFY LAB WITHIN 5 DAYS. LOWEST DETECTABLE LIMITS FOR URINE DRUG SCREEN Drug Class                     Cutoff (ng/mL) Amphetamine and metabolites    1000 Barbiturate and metabolites    200 Benzodiazepine                 [id number] Tricyclics and metabolites     300 Opiates and metabolites        300 Cocaine and metabolites        300 THC                            50 Performed at [facility], [address] [place], [place] [address]   Urinalysis, Routine w reflex microscopic     Status: Abnormal   Collection Time: 02/28/19  9:40 PM  Result Value Ref Range   Color, Urine COLORLESS (A) YELLOW   APPearance CLEAR CLEAR   Specific Gravity, Urine 1.003 (L) 1.005 - 1.030   pH 7.0 5.0 - 8.0   Glucose, UA NEGATIVE NEGATIVE mg/dL   Hgb urine dipstick NEGATIVE NEGATIVE   Bilirubin Urine NEGATIVE NEGATIVE   Ketones, ur NEGATIVE NEGATIVE mg/dL   Protein, ur NEGATIVE NEGATIVE mg/dL   Nitrite NEGATIVE NEGATIVE   Leukocytes,Ua TRACE (A) NEGATIVE   RBC / HPF 0-5 0 - 5 RBC/hpf   WBC, UA 6-10 0 - 5 WBC/hpf   Bacteria, UA NONE SEEN NONE SEEN   Squamous Epithelial / LPF 0-5 0 - 5    Comment: Performed at [facility], [address] [place], [place] [address]  CBG monitoring, ED     Status: None   Collection Time: 02/28/19  9:58 PM  Result Value Ref Range   Glucose-Capillary 89 70 - 99 mg/dL  SARS CORONAVIRUS 2 (TAT 6-24 HRS) Nasopharyngeal Nasopharyngeal Swab     Status: None   Collection Time: 02/28/19 10:33 PM   Specimen: Nasopharyngeal Swab  Result Value Ref Range   SARS Coronavirus 2 NEGATIVE NEGATIVE    Comment: (NOTE) SARS-CoV-2 target nucleic acids are NOT DETECTED. The SARS-CoV-2 RNA is generally detectable in upper and lower respiratory specimens during the acute phase of infection.  Negative results do not preclude SARS-CoV-2 infection, do not rule out co-infections with other pathogens, and should not be used as the sole basis for treatment or other patient management decisions. Negative results must be combined with clinical observations, patient history, and epidemiological information. The expected result is Negative. Fact Sheet for Patients: SugarRoll.be Fact Sheet for Healthcare Providers: https://www.woods-mathews.com/ This test is not yet approved or cleared by the [place] FDA and  has been authorized for detection and/or diagnosis of SARS-CoV-2 by FDA  under an Emergency Use Authorization (EUA). This EUA will remain  in effect (meaning this test can be used) for the duration of the COVID-19 declaration under Section 56 4(b)(1) of the Act, 21 U.S.C. section 360bbb-3(b)(1), unless the authorization is terminated or revoked sooner. Performed at [facility], [place] [address], [place], [place] [address]   Hemoglobin A1c     Status: Abnormal   Collection Time: 03/01/19  6:29 AM  Result Value Ref Range   Hgb A1c MFr Bld 6.5 (H) 4.8 - 5.6 %    Comment: (NOTE) Pre diabetes:          5.7%-6.4% Diabetes:              >6.4% Glycemic control for   <7.0% adults with diabetes    Mean Plasma Glucose 139.85 mg/dL    Comment: Performed at [facility] [address], [place], [place] [address]  Lipid panel     Status: None   Collection Time: 03/01/19  6:29 AM  Result Value Ref Range   Cholesterol 143 0 - 200 mg/dL   Triglycerides 42 <150 mg/dL   HDL 54 >40 mg/dL   Total CHOL/HDL Ratio 2.6 RATIO   VLDL 8 0 - 40 mg/dL   LDL Cholesterol 81 0 - 99 mg/dL    Comment:        Total Cholesterol/HDL:CHD Risk Coronary Heart Disease Risk Table                     Men   Women  1/2 Average Risk   3.4   3.3  Average Risk       5.0   4.4  2 X Average Risk   9.6   7.1  3 X Average Risk  23.4   11.0        Use the  calculated Patient Ratio above and the CHD Risk Table to determine the patient's CHD Risk.        ATP III CLASSIFICATION (LDL):  <100     mg/dL   Optimal  100-129  mg/dL   Near or Above                    Optimal  130-159  mg/dL   Borderline  160-189  mg/dL   High  >190     mg/dL   Very High Performed at [place] [place], [place] [address]     Studies/Results:  BRAIN MRI FINDINGS: Brain: No restricted diffusion or evidence of acute infarction.  Severe chronic T2 signal abnormality in the bilateral deep gray matter nuclei. Moderate similar chronic changes in the pons. Scattered chronic microhemorrhages, mostly in the thalami, occasionally in the cerebellum and cerebrum. Confluent bilateral cerebral white matter T2 and FLAIR hyperintensity. No cortical encephalomalacia or new signal abnormality identified.  No midline shift, mass effect, evidence of mass lesion, ventriculomegaly, extra-axial collection or acute intracranial hemorrhage. Cervicomedullary junction and pituitary are within normal limits.  Vascular: Major intracranial vascular flow voids are stable. Generalized intracranial artery tortuosity.  Skull and upper cervical spine: Negative visible cervical spine. Visualized bone marrow signal is within normal limits. Mild asymmetric hyperostosis of the left frontal bone again noted (series 9, image 31).  Sinuses/Orbits: Negative; mild Disconjugate gaze again noted.  Other: Mastoids remain clear. Negative visible internal auditory structures. Scalp and face soft tissues appear negative.  IMPRESSION: 1. Severe chronic small vessel disease with no acute infarct identified. 2.  No new intracranial abnormality since August.    Brain mri reviewed in person: No acute changes; mod-severe PVWD; moderate atrophy; prior lac thalami L > R; microbleed R frontal and thalami.     Jammie Clink A. Merlene Laughter, M.D.  Diplomate, Tax adviser of  Psychiatry and Neurology ( Neurology). 03/01/2019, 5:14 PM

## 2019-03-01 NOTE — Clinical Social Work Note (Signed)
Patient was discharged from [facility] on 01/29/2019. [name] is checking to see if they are available to take her back.   Ahad Colarusso, Clydene Pugh, LCSW

## 2019-03-01 NOTE — Evaluation (Signed)
Physical Therapy Evaluation Patient Details Name: [name] MRN: [id number] DOB: 04-19-1934 Today's Date: 03/01/2019   History of Present Illness  [name]  is a 84 y.o. female, with history of CVA in September of this year, hypothyroidism, hyperlipidemia, hypertension, and more who presents to the ED today with left-sided weakness and paresthesias on left face.  Patient reports that her symptoms started around 5:30 PM on 02/28/2019.  She noticed paresthesias on her left side of face and increased weakness on her left upper extremity and left lower extremity.  Patient is prior stroke did leave her with residual left-sided weakness, but today she reports that it became worse.  She also had an associated tremulousness on her left side upper extremity and lower extremity.  Patient reports no pain, no change in speech, no difficulty swallowing, no change in vision, no change in hearing.  Patient reports no headache.  Patient does report urinary frequency and says that she associates this with an increase in blood pressure.  She checked her blood pressure at home and reports that the diastolic number was high, but does not know the exact numbers.  Patient reports no missed doses of her medications.  Patient does report that her primary care physician has taken her off of Eliquis, which she was on for history of Pes, due to microbleeds.  Patient has no other complaints at this time    Clinical Impression  Patient functioning near baseline for functional mobility and gait. She demonstrates generalized weakness during today's session and fatigues quickly with ambulation. She ambulates safely with AD and has no loss of balance. She is able to complete bed mobility and transfers slowly but independently. Patient will benefit from continued physical therapy in hospital and recommended venue below to increase strength, balance, endurance for safe ADLs and gait.      Follow Up  Recommendations Home health PT;Supervision - Intermittent    Equipment Recommendations  None recommended by PT    Recommendations for Other Services       Precautions / Restrictions Precautions Precautions: Fall Precaution Comments: History of residual left side weakness from previous CVA Restrictions Weight Bearing Restrictions: No      Mobility  Bed Mobility Overal bed mobility: Modified Independent             General bed mobility comments: slightly slower  Transfers Overall transfer level: Modified independent Equipment used: Straight cane(tripod cane) Transfers: Sit to/from Stand Sit to Stand: Supervision         General transfer comment: using tripod cane to transfer from EOB to standing  Ambulation/Gait Ambulation/Gait assistance: Supervision Gait Distance (Feet): 75 Feet Assistive device: Straight cane(tripod cane) Gait Pattern/deviations: Step-through pattern;Decreased step length - right;Decreased step length - left;Decreased stride length Gait velocity: decreased   General Gait Details: patient ambulates slowly, minimally labored with assistive device; patient limited by fatigue  Stairs            Wheelchair Mobility    Modified Rankin (Stroke Patients Only)       Balance Overall balance assessment: Needs assistance Sitting-balance support: Feet supported;No upper extremity supported Sitting balance-Leahy Scale: Normal Sitting balance - Comments: seated EOB   Standing balance support: Single extremity supported;During functional activity Standing balance-Leahy Scale: Good Standing balance comment: standing at bedside and while ambulating                             Pertinent Vitals/Pain Pain Assessment: No/denies  pain    Home Living Family/patient expects to be discharged to:: Private residence Living Arrangements: Alone Available Help at Discharge: Family;Available PRN/intermittently;Neighbor(daughter calls/texts  frequently during the day. daughter also assists PRN with tasks at home. Patient has multiple neighbors who check on her/ are willing to assist) Type of Home: House Home Access: Stairs to enter Entrance Stairs-Rails: Right;Left(Can not reach both) Entrance Stairs-Number of Steps: 1 Home Layout: One level Home Equipment: Grab bars - toilet;Grab bars - tub/shower;Bedside commode;Shower seat;Other (comment);Cane - single point(tripod cane) Additional Comments: has tripod cane    Prior Function Level of Independence: Independent with assistive device(s)         Comments: household ambulation without AD, uses tripod cane for short distanced community ambulation; independent in ADLs     Hand Dominance   Dominant Hand: Right    Extremity/Trunk Assessment   Upper Extremity Assessment Upper Extremity Assessment: Defer to OT evaluation    Lower Extremity Assessment Lower Extremity Assessment: Generalized weakness(L sided weakness from prior CVA)       Communication   Communication: No difficulties  Cognition Arousal/Alertness: Awake/alert Behavior During Therapy: WFL for tasks assessed/performed Overall Cognitive Status: Within Functional Limits for tasks assessed                                        General Comments      Exercises     Assessment/Plan    PT Assessment Patient needs continued PT services  PT Problem List Decreased strength;Decreased activity tolerance;Decreased balance;Decreased mobility       PT Treatment Interventions DME instruction;Gait training;Stair training;Functional mobility training;Therapeutic activities;Therapeutic exercise;Balance training;Neuromuscular re-education;Patient/family education;Manual techniques;Modalities    PT Goals (Current goals can be found in the Care Plan section)  Acute Rehab PT Goals Patient Stated Goal: Return home PT Goal Formulation: With patient Time For Goal Achievement: 03/08/19 Potential to  Achieve Goals: Good    Frequency Min 3X/week   Barriers to discharge        Co-evaluation PT/OT/SLP Co-Evaluation/Treatment: Yes Reason for Co-Treatment: To address functional/ADL transfers;Other (comment)(OT arriving as PT was finishing session) PT goals addressed during session: Mobility/safety with mobility;Balance;Proper use of DME;Strengthening/ROM OT goals addressed during session: ADL's and self-care;Strengthening/ROM       AM-PAC PT "6 Clicks" Mobility  Outcome Measure Help needed turning from your back to your side while in a flat bed without using bedrails?: None Help needed moving from lying on your back to sitting on the side of a flat bed without using bedrails?: None Help needed moving to and from a bed to a chair (including a wheelchair)?: None Help needed standing up from a chair using your arms (e.g., wheelchair or bedside chair)?: None Help needed to walk in hospital room?: A Little Help needed climbing 3-5 steps with a railing? : A Little 6 Click Score: 22    End of Session   Activity Tolerance: Patient tolerated treatment well;Patient limited by fatigue Patient left: in chair;with call bell/phone within reach;with chair alarm set;Other (comment)(with OT in room) Nurse Communication: Mobility status PT Visit Diagnosis: Unsteadiness on feet (R26.81);Other abnormalities of gait and mobility (R26.89);Muscle weakness (generalized) (M62.81);Other symptoms and signs involving the nervous system (R29.898)    Time: [id number] PT Time Calculation (min) (ACUTE ONLY): 21 min   Charges:   PT Evaluation $PT Eval Low Complexity: 1 Low PT Treatments $Therapeutic Activity: 8-22 mins  9:68 AM, 03/01/19 Mearl Latin PT, DPT Physical Therapist at [facility]

## 2019-03-02 DIAGNOSIS — N1832 Chronic kidney disease, stage 3b: Secondary | ICD-10-CM | POA: Diagnosis not present

## 2019-03-02 DIAGNOSIS — G40209 Localization-related (focal) (partial) symptomatic epilepsy and epileptic syndromes with complex partial seizures, not intractable, without status epilepticus: Secondary | ICD-10-CM

## 2019-03-02 DIAGNOSIS — G8194 Hemiplegia, unspecified affecting left nondominant side: Secondary | ICD-10-CM | POA: Diagnosis not present

## 2019-03-02 DIAGNOSIS — R209 Unspecified disturbances of skin sensation: Secondary | ICD-10-CM | POA: Diagnosis not present

## 2019-03-02 MED ORDER — LEVETIRACETAM 250 MG PO TABS: 250.0000 mg | ORAL_TABLET | Freq: Two times a day (BID) | ORAL | 1 refills | Status: DC

## 2019-03-02 NOTE — Plan of Care (Signed)
Patient verbalized understanding of discharge instructions and signs and symptoms of TIA/Stroke.  Stroke Booklet given and reviewed with patient and daughter.  Both verbalized understanding of instruction.

## 2019-03-02 NOTE — TOC Transition Note (Addendum)
Transition of Care [facility]) - CM/SW Discharge Note   Patient Details  Name: [name] MRN: [id number] Date of Birth: Mar 14, 1935  [facility] of [facility] CM/SW Contact:  Marshell Garfinkel, RN Phone Number: 03/02/2019, 8:12 AM   Clinical Narrative:     This RNCM spoke with patient and her daughter [name] [id number] to offer home health services. She has used [facility] health in the past and would like to use them again. I have reached out to Rema Jasmine with [facility] to see if they can assist. Patient is from home alone however [name] will be providing supervision. Patient ambulates at baseline with a walker at home. Her PCP is Dr. Buelah Manis. [name] will provide transportation to home when discharged. Your hospital case mgr is Norberto Sorenson [phone]. Update: callback from [place] with [facility] and they do not have staff to assist this patient at this time. I am now checking with Santiago Glad with [facility]. Update: [facility]. Patient aware and agrees.  Final next level of care: [place] Barriers to Discharge: No Barriers Identified   Patient Goals and CMS Choice Patient states their goals for this hospitalization and ongoing recovery are:: "return to home followed by [facility]". CMS Medicare.gov Compare Post Acute Care list provided to:: Patient Choice offered to / list presented to : Patient  Discharge Placement                       Discharge Plan and Services     Post Acute Care Choice: Home Health                    HH Arranged: PT, Nurse's Aide [facility] Agency: [place] Date [place]: 03/02/19 Time [place]: [phone] Representative spoke with at [place]: [place] ([place]) Interventions     Readmission Risk Interventions No flowsheet data found.

## 2019-03-03 DIAGNOSIS — N183 Chronic kidney disease, stage 3 unspecified: Secondary | ICD-10-CM | POA: Diagnosis not present

## 2019-03-03 DIAGNOSIS — E039 Hypothyroidism, unspecified: Secondary | ICD-10-CM | POA: Diagnosis not present

## 2019-03-03 DIAGNOSIS — E1122 Type 2 diabetes mellitus with diabetic chronic kidney disease: Secondary | ICD-10-CM | POA: Diagnosis not present

## 2019-03-03 DIAGNOSIS — Z85038 Personal history of other malignant neoplasm of large intestine: Secondary | ICD-10-CM | POA: Diagnosis not present

## 2019-03-03 DIAGNOSIS — Z86711 Personal history of pulmonary embolism: Secondary | ICD-10-CM | POA: Diagnosis not present

## 2019-03-03 DIAGNOSIS — Z9049 Acquired absence of other specified parts of digestive tract: Secondary | ICD-10-CM | POA: Diagnosis not present

## 2019-03-03 DIAGNOSIS — I69354 Hemiplegia and hemiparesis following cerebral infarction affecting left non-dominant side: Secondary | ICD-10-CM | POA: Diagnosis not present

## 2019-03-03 DIAGNOSIS — M1990 Unspecified osteoarthritis, unspecified site: Secondary | ICD-10-CM | POA: Diagnosis not present

## 2019-03-03 DIAGNOSIS — E785 Hyperlipidemia, unspecified: Secondary | ICD-10-CM | POA: Diagnosis not present

## 2019-03-03 DIAGNOSIS — R202 Paresthesia of skin: Secondary | ICD-10-CM | POA: Diagnosis not present

## 2019-03-03 DIAGNOSIS — I129 Hypertensive chronic kidney disease with stage 1 through stage 4 chronic kidney disease, or unspecified chronic kidney disease: Secondary | ICD-10-CM | POA: Diagnosis not present

## 2019-03-03 DIAGNOSIS — R42 Dizziness and giddiness: Secondary | ICD-10-CM | POA: Diagnosis not present

## 2019-03-04 ENCOUNTER — Telehealth: Payer: Self-pay | Admitting: *Deleted

## 2019-03-04 NOTE — Telephone Encounter (Signed)
Received call from patient caregiver, Vickie.   Reports that patient is taking Keppra 250mg  PO BID. States that Keppra is making her very fatigued and drowsy throughout the day.  States that she is very weak and is concerned about her balance when she gets up in AM to go to [place].   Inquired as to if she can cut AM dose in half. Advised to give half tab in AM, but continue whole tab at night. Advised to call back in (1) week to determine if medication change is helpful.

## 2019-03-05 ENCOUNTER — Telehealth: Payer: Self-pay | Admitting: *Deleted

## 2019-03-05 DIAGNOSIS — Z85038 Personal history of other malignant neoplasm of large intestine: Secondary | ICD-10-CM | POA: Diagnosis not present

## 2019-03-05 DIAGNOSIS — I129 Hypertensive chronic kidney disease with stage 1 through stage 4 chronic kidney disease, or unspecified chronic kidney disease: Secondary | ICD-10-CM | POA: Diagnosis not present

## 2019-03-05 DIAGNOSIS — Z9049 Acquired absence of other specified parts of digestive tract: Secondary | ICD-10-CM | POA: Diagnosis not present

## 2019-03-05 DIAGNOSIS — E1122 Type 2 diabetes mellitus with diabetic chronic kidney disease: Secondary | ICD-10-CM | POA: Diagnosis not present

## 2019-03-05 DIAGNOSIS — M1990 Unspecified osteoarthritis, unspecified site: Secondary | ICD-10-CM | POA: Diagnosis not present

## 2019-03-05 DIAGNOSIS — Z86711 Personal history of pulmonary embolism: Secondary | ICD-10-CM | POA: Diagnosis not present

## 2019-03-05 DIAGNOSIS — E785 Hyperlipidemia, unspecified: Secondary | ICD-10-CM | POA: Diagnosis not present

## 2019-03-05 DIAGNOSIS — I69354 Hemiplegia and hemiparesis following cerebral infarction affecting left non-dominant side: Secondary | ICD-10-CM | POA: Diagnosis not present

## 2019-03-05 DIAGNOSIS — N183 Chronic kidney disease, stage 3 unspecified: Secondary | ICD-10-CM | POA: Diagnosis not present

## 2019-03-05 DIAGNOSIS — R42 Dizziness and giddiness: Secondary | ICD-10-CM | POA: Diagnosis not present

## 2019-03-05 DIAGNOSIS — E039 Hypothyroidism, unspecified: Secondary | ICD-10-CM | POA: Diagnosis not present

## 2019-03-05 DIAGNOSIS — R202 Paresthesia of skin: Secondary | ICD-10-CM | POA: Diagnosis not present

## 2019-03-05 NOTE — Telephone Encounter (Signed)
Received call from [place], [facility] PT with [place]. [phone]~ telephone.   Reports that PT evaluation completed on 03/03/2019. Requested VO for PT services 3x weekly x2 weeks, then 2x weekly x2 weeks for strengthening, gait, and transfer safety. VO given.

## 2019-03-07 DIAGNOSIS — Z86711 Personal history of pulmonary embolism: Secondary | ICD-10-CM | POA: Diagnosis not present

## 2019-03-07 DIAGNOSIS — R42 Dizziness and giddiness: Secondary | ICD-10-CM | POA: Diagnosis not present

## 2019-03-07 DIAGNOSIS — E1122 Type 2 diabetes mellitus with diabetic chronic kidney disease: Secondary | ICD-10-CM | POA: Diagnosis not present

## 2019-03-07 DIAGNOSIS — E785 Hyperlipidemia, unspecified: Secondary | ICD-10-CM | POA: Diagnosis not present

## 2019-03-07 DIAGNOSIS — R202 Paresthesia of skin: Secondary | ICD-10-CM | POA: Diagnosis not present

## 2019-03-07 DIAGNOSIS — M1990 Unspecified osteoarthritis, unspecified site: Secondary | ICD-10-CM | POA: Diagnosis not present

## 2019-03-07 DIAGNOSIS — Z9049 Acquired absence of other specified parts of digestive tract: Secondary | ICD-10-CM | POA: Diagnosis not present

## 2019-03-07 DIAGNOSIS — I69354 Hemiplegia and hemiparesis following cerebral infarction affecting left non-dominant side: Secondary | ICD-10-CM | POA: Diagnosis not present

## 2019-03-07 DIAGNOSIS — N183 Chronic kidney disease, stage 3 unspecified: Secondary | ICD-10-CM | POA: Diagnosis not present

## 2019-03-07 DIAGNOSIS — E039 Hypothyroidism, unspecified: Secondary | ICD-10-CM | POA: Diagnosis not present

## 2019-03-07 DIAGNOSIS — Z85038 Personal history of other malignant neoplasm of large intestine: Secondary | ICD-10-CM | POA: Diagnosis not present

## 2019-03-07 DIAGNOSIS — I129 Hypertensive chronic kidney disease with stage 1 through stage 4 chronic kidney disease, or unspecified chronic kidney disease: Secondary | ICD-10-CM | POA: Diagnosis not present

## 2019-03-11 DIAGNOSIS — E785 Hyperlipidemia, unspecified: Secondary | ICD-10-CM | POA: Diagnosis not present

## 2019-03-11 DIAGNOSIS — R5383 Other fatigue: Secondary | ICD-10-CM | POA: Diagnosis not present

## 2019-03-11 DIAGNOSIS — Z85038 Personal history of other malignant neoplasm of large intestine: Secondary | ICD-10-CM | POA: Diagnosis not present

## 2019-03-11 DIAGNOSIS — M1990 Unspecified osteoarthritis, unspecified site: Secondary | ICD-10-CM | POA: Diagnosis not present

## 2019-03-11 DIAGNOSIS — N183 Chronic kidney disease, stage 3 unspecified: Secondary | ICD-10-CM | POA: Diagnosis not present

## 2019-03-11 DIAGNOSIS — I69354 Hemiplegia and hemiparesis following cerebral infarction affecting left non-dominant side: Secondary | ICD-10-CM | POA: Diagnosis not present

## 2019-03-11 DIAGNOSIS — G890 Central pain syndrome: Secondary | ICD-10-CM | POA: Diagnosis not present

## 2019-03-11 DIAGNOSIS — G4701 Insomnia due to medical condition: Secondary | ICD-10-CM | POA: Diagnosis not present

## 2019-03-11 DIAGNOSIS — R42 Dizziness and giddiness: Secondary | ICD-10-CM | POA: Diagnosis not present

## 2019-03-11 DIAGNOSIS — Z9049 Acquired absence of other specified parts of digestive tract: Secondary | ICD-10-CM | POA: Diagnosis not present

## 2019-03-11 DIAGNOSIS — E039 Hypothyroidism, unspecified: Secondary | ICD-10-CM | POA: Diagnosis not present

## 2019-03-11 DIAGNOSIS — E1122 Type 2 diabetes mellitus with diabetic chronic kidney disease: Secondary | ICD-10-CM | POA: Diagnosis not present

## 2019-03-11 DIAGNOSIS — I129 Hypertensive chronic kidney disease with stage 1 through stage 4 chronic kidney disease, or unspecified chronic kidney disease: Secondary | ICD-10-CM | POA: Diagnosis not present

## 2019-03-11 DIAGNOSIS — Z86711 Personal history of pulmonary embolism: Secondary | ICD-10-CM | POA: Diagnosis not present

## 2019-03-11 DIAGNOSIS — R202 Paresthesia of skin: Secondary | ICD-10-CM | POA: Diagnosis not present

## 2019-03-13 ENCOUNTER — Encounter: Payer: Medicare Other | Admitting: Family Medicine

## 2019-03-13 ENCOUNTER — Other Ambulatory Visit: Payer: Self-pay

## 2019-03-13 ENCOUNTER — Ambulatory Visit (INDEPENDENT_AMBULATORY_CARE_PROVIDER_SITE_OTHER): Payer: Medicare Other | Admitting: Family Medicine

## 2019-03-13 ENCOUNTER — Encounter: Payer: Self-pay | Admitting: Family Medicine

## 2019-03-13 DIAGNOSIS — I10 Essential (primary) hypertension: Secondary | ICD-10-CM | POA: Diagnosis not present

## 2019-03-13 DIAGNOSIS — I69354 Hemiplegia and hemiparesis following cerebral infarction affecting left non-dominant side: Secondary | ICD-10-CM | POA: Diagnosis not present

## 2019-03-13 DIAGNOSIS — Z9049 Acquired absence of other specified parts of digestive tract: Secondary | ICD-10-CM | POA: Diagnosis not present

## 2019-03-13 DIAGNOSIS — R7303 Prediabetes: Secondary | ICD-10-CM | POA: Insufficient documentation

## 2019-03-13 DIAGNOSIS — I129 Hypertensive chronic kidney disease with stage 1 through stage 4 chronic kidney disease, or unspecified chronic kidney disease: Secondary | ICD-10-CM | POA: Diagnosis not present

## 2019-03-13 DIAGNOSIS — G40209 Localization-related (focal) (partial) symptomatic epilepsy and epileptic syndromes with complex partial seizures, not intractable, without status epilepticus: Secondary | ICD-10-CM

## 2019-03-13 DIAGNOSIS — M1990 Unspecified osteoarthritis, unspecified site: Secondary | ICD-10-CM | POA: Diagnosis not present

## 2019-03-13 DIAGNOSIS — E119 Type 2 diabetes mellitus without complications: Secondary | ICD-10-CM

## 2019-03-13 DIAGNOSIS — N183 Chronic kidney disease, stage 3 unspecified: Secondary | ICD-10-CM | POA: Diagnosis not present

## 2019-03-13 DIAGNOSIS — E1122 Type 2 diabetes mellitus with diabetic chronic kidney disease: Secondary | ICD-10-CM | POA: Diagnosis not present

## 2019-03-13 DIAGNOSIS — N1832 Chronic kidney disease, stage 3b: Secondary | ICD-10-CM | POA: Diagnosis not present

## 2019-03-13 DIAGNOSIS — E785 Hyperlipidemia, unspecified: Secondary | ICD-10-CM | POA: Diagnosis not present

## 2019-03-13 DIAGNOSIS — R42 Dizziness and giddiness: Secondary | ICD-10-CM | POA: Diagnosis not present

## 2019-03-13 DIAGNOSIS — E039 Hypothyroidism, unspecified: Secondary | ICD-10-CM | POA: Diagnosis not present

## 2019-03-13 DIAGNOSIS — R202 Paresthesia of skin: Secondary | ICD-10-CM | POA: Diagnosis not present

## 2019-03-13 DIAGNOSIS — Z86711 Personal history of pulmonary embolism: Secondary | ICD-10-CM | POA: Diagnosis not present

## 2019-03-13 DIAGNOSIS — Z85038 Personal history of other malignant neoplasm of large intestine: Secondary | ICD-10-CM | POA: Diagnosis not present

## 2019-03-13 NOTE — Assessment & Plan Note (Signed)
Reviewed hospital discharge and notes She will take ASA 325mg , there was some confusion about that F/u neurology for EEG  Continue keppra at 125mg  BID

## 2019-03-13 NOTE — Patient Instructions (Signed)
F/U as previous 

## 2019-03-13 NOTE — Assessment & Plan Note (Signed)
Controlled no changes She was worried about indention's around her socks, no significant edema found Advised she can elevate feet if she notices mild swelling, discussed red flags with daughter and patient

## 2019-03-13 NOTE — Assessment & Plan Note (Signed)
Diet controlled, A1C  6.5%

## 2019-03-13 NOTE — Assessment & Plan Note (Signed)
Cr at baseline, reviewed labs

## 2019-03-13 NOTE — Progress Notes (Signed)
   Subjective:    Patient ID: [name], female    DOB: 05/14/1934, 84 y.o.   MRN: [id number]  Patient presents for Hospital F/U  Patient here for hospital follow-up.  She was noted to the hospital in mid December secondary to stroke-like symptoms. She does have history of stroke.  She been taking aspirin 325 mg I took her off of her apixban in the setting of multiple microhemorrhages noted on her MRI back in the fall.  Admitted to the emergency room after having increased left-sided facial tingling as well as shaking of the upper extremity  neurology was consulted.  They felt patient had complex partial seizure she was started on Keppra.  She was evaluated by speech therapy given a regular diet.  She did not have any new findings on her MRI of her brain or her CT scan.  Her carotid duplex did not show any significant stenosis.  Was continued on aspirin 325 mg daily.  - she is currently in physical therapy- Home Health   Currently on Keppra 125mg  BID the full dose caused increased somnolence, EEG scheduled for Jan 7th  She feels pretty good today, has not had any further episodes or falls Appetite is good   Hypertension they did hold her blood pressure medications while inpatient but this was restarted at discharge.  Diabetes  mellitus her A1c was 6.5% diet controlled, LDL was at 81 she is on statin drug Lipitor 40 mg daily  Review Of Systems:  GEN- denies fatigue, fever, weight loss,weakness, recent illness HEENT- denies eye drainage, change in vision, nasal discharge, CVS- denies chest pain, palpitations RESP- denies SOB, cough, wheeze ABD- denies N/V, change in stools, abd pain GU- denies dysuria, hematuria, dribbling, incontinence MSK- denies joint pain, muscle aches, injury Neuro- denies headache, dizziness, syncope, seizure activity       Objective:    BP 130/86   Pulse 66   Temp 98.2 F (36.8 C) (Temporal)   Resp 16   Ht 5\' 3"  (1.6 m)   Wt 159 lb (72.1 kg)    SpO2 98%   BMI 28.17 kg/m  GEN- NAD, alert and oriented x3 HEENT- PERRL, EOMI, non injected sclera, pink conjunctiva, MMM, oropharynx clear Neck- Supple, no thyromegaly CVS- RRR, no murmur RESP-CTAB ABD-NABS,soft,NT,ND EXT- No edema Pulses- Radial, DP- 2+        Assessment & Plan:      Problem List Items Addressed This Visit      Unprioritized   CKD (chronic kidney disease), stage III (HCC)    Cr at baseline, reviewed labs      Complex partial seizure [facility])    Reviewed hospital discharge and notes She will take ASA 325mg , there was some confusion about that F/u neurology for EEG  Continue keppra at 125mg  BID      Diabetes mellitus without complication (HCC)    Diet controlled, A1C  6.5%      Essential hypertension    Controlled no changes She was worried about indention's around her socks, no significant edema found Advised she can elevate feet if she notices mild swelling, discussed red flags with daughter and patient          Note: This dictation was prepared with Dragon dictation along with smaller phrase technology. Any transcriptional errors that result from this process are unintentional.

## 2019-03-14 DIAGNOSIS — Z85038 Personal history of other malignant neoplasm of large intestine: Secondary | ICD-10-CM | POA: Diagnosis not present

## 2019-03-14 DIAGNOSIS — I129 Hypertensive chronic kidney disease with stage 1 through stage 4 chronic kidney disease, or unspecified chronic kidney disease: Secondary | ICD-10-CM | POA: Diagnosis not present

## 2019-03-14 DIAGNOSIS — E039 Hypothyroidism, unspecified: Secondary | ICD-10-CM | POA: Diagnosis not present

## 2019-03-14 DIAGNOSIS — Z86711 Personal history of pulmonary embolism: Secondary | ICD-10-CM | POA: Diagnosis not present

## 2019-03-14 DIAGNOSIS — R202 Paresthesia of skin: Secondary | ICD-10-CM | POA: Diagnosis not present

## 2019-03-14 DIAGNOSIS — E1122 Type 2 diabetes mellitus with diabetic chronic kidney disease: Secondary | ICD-10-CM | POA: Diagnosis not present

## 2019-03-14 DIAGNOSIS — M1990 Unspecified osteoarthritis, unspecified site: Secondary | ICD-10-CM | POA: Diagnosis not present

## 2019-03-14 DIAGNOSIS — N183 Chronic kidney disease, stage 3 unspecified: Secondary | ICD-10-CM | POA: Diagnosis not present

## 2019-03-14 DIAGNOSIS — R42 Dizziness and giddiness: Secondary | ICD-10-CM | POA: Diagnosis not present

## 2019-03-14 DIAGNOSIS — Z9049 Acquired absence of other specified parts of digestive tract: Secondary | ICD-10-CM | POA: Diagnosis not present

## 2019-03-14 DIAGNOSIS — I69354 Hemiplegia and hemiparesis following cerebral infarction affecting left non-dominant side: Secondary | ICD-10-CM | POA: Diagnosis not present

## 2019-03-14 DIAGNOSIS — E785 Hyperlipidemia, unspecified: Secondary | ICD-10-CM | POA: Diagnosis not present

## 2019-03-19 DIAGNOSIS — R42 Dizziness and giddiness: Secondary | ICD-10-CM | POA: Diagnosis not present

## 2019-03-19 DIAGNOSIS — M1990 Unspecified osteoarthritis, unspecified site: Secondary | ICD-10-CM | POA: Diagnosis not present

## 2019-03-19 DIAGNOSIS — I129 Hypertensive chronic kidney disease with stage 1 through stage 4 chronic kidney disease, or unspecified chronic kidney disease: Secondary | ICD-10-CM | POA: Diagnosis not present

## 2019-03-19 DIAGNOSIS — N183 Chronic kidney disease, stage 3 unspecified: Secondary | ICD-10-CM | POA: Diagnosis not present

## 2019-03-19 DIAGNOSIS — I69354 Hemiplegia and hemiparesis following cerebral infarction affecting left non-dominant side: Secondary | ICD-10-CM | POA: Diagnosis not present

## 2019-03-19 DIAGNOSIS — Z9049 Acquired absence of other specified parts of digestive tract: Secondary | ICD-10-CM | POA: Diagnosis not present

## 2019-03-19 DIAGNOSIS — E785 Hyperlipidemia, unspecified: Secondary | ICD-10-CM | POA: Diagnosis not present

## 2019-03-19 DIAGNOSIS — E039 Hypothyroidism, unspecified: Secondary | ICD-10-CM | POA: Diagnosis not present

## 2019-03-19 DIAGNOSIS — R202 Paresthesia of skin: Secondary | ICD-10-CM | POA: Diagnosis not present

## 2019-03-19 DIAGNOSIS — E1122 Type 2 diabetes mellitus with diabetic chronic kidney disease: Secondary | ICD-10-CM | POA: Diagnosis not present

## 2019-03-19 DIAGNOSIS — Z85038 Personal history of other malignant neoplasm of large intestine: Secondary | ICD-10-CM | POA: Diagnosis not present

## 2019-03-19 DIAGNOSIS — Z86711 Personal history of pulmonary embolism: Secondary | ICD-10-CM | POA: Diagnosis not present

## 2019-03-22 DIAGNOSIS — R202 Paresthesia of skin: Secondary | ICD-10-CM | POA: Diagnosis not present

## 2019-03-22 DIAGNOSIS — Z9049 Acquired absence of other specified parts of digestive tract: Secondary | ICD-10-CM | POA: Diagnosis not present

## 2019-03-22 DIAGNOSIS — E039 Hypothyroidism, unspecified: Secondary | ICD-10-CM | POA: Diagnosis not present

## 2019-03-22 DIAGNOSIS — E1122 Type 2 diabetes mellitus with diabetic chronic kidney disease: Secondary | ICD-10-CM | POA: Diagnosis not present

## 2019-03-22 DIAGNOSIS — I69354 Hemiplegia and hemiparesis following cerebral infarction affecting left non-dominant side: Secondary | ICD-10-CM | POA: Diagnosis not present

## 2019-03-22 DIAGNOSIS — Z85038 Personal history of other malignant neoplasm of large intestine: Secondary | ICD-10-CM | POA: Diagnosis not present

## 2019-03-22 DIAGNOSIS — N183 Chronic kidney disease, stage 3 unspecified: Secondary | ICD-10-CM | POA: Diagnosis not present

## 2019-03-22 DIAGNOSIS — Z86711 Personal history of pulmonary embolism: Secondary | ICD-10-CM | POA: Diagnosis not present

## 2019-03-22 DIAGNOSIS — R42 Dizziness and giddiness: Secondary | ICD-10-CM | POA: Diagnosis not present

## 2019-03-22 DIAGNOSIS — E785 Hyperlipidemia, unspecified: Secondary | ICD-10-CM | POA: Diagnosis not present

## 2019-03-22 DIAGNOSIS — M1990 Unspecified osteoarthritis, unspecified site: Secondary | ICD-10-CM | POA: Diagnosis not present

## 2019-03-22 DIAGNOSIS — I129 Hypertensive chronic kidney disease with stage 1 through stage 4 chronic kidney disease, or unspecified chronic kidney disease: Secondary | ICD-10-CM | POA: Diagnosis not present

## 2019-04-01 ENCOUNTER — Telehealth: Payer: Self-pay | Admitting: *Deleted

## 2019-04-01 NOTE — Telephone Encounter (Signed)
Agree with below Pt can get vaccine

## 2019-04-01 NOTE — Telephone Encounter (Signed)
Received call from patient caregiver, Vickie.   Inquired as to if the COVID Vaccine is appropriate for patient.   Advised that it is the belief of [facility] that all patient's would benefit from the vaccine. Advised that there are no interactions/ contraindications noted with vaccination at this time.   Advised to contact local health department or the  for more information.   [facility]: 1- [phone] [email or web address]  [facility] Department: [place] Department: [phone], opt [place] Department: [phone]  Or use the [place] website for more information:  [email or web address]

## 2019-04-16 ENCOUNTER — Inpatient Hospital Stay (HOSPITAL_COMMUNITY): Payer: Medicare Other | Attending: Hematology

## 2019-04-16 ENCOUNTER — Other Ambulatory Visit: Payer: Self-pay

## 2019-04-16 DIAGNOSIS — I2699 Other pulmonary embolism without acute cor pulmonale: Secondary | ICD-10-CM | POA: Diagnosis not present

## 2019-04-16 DIAGNOSIS — Z86711 Personal history of pulmonary embolism: Secondary | ICD-10-CM

## 2019-04-16 LAB — CBC WITH DIFFERENTIAL/PLATELET
Abs Immature Granulocytes: 0.01 10*3/uL (ref 0.00–0.07)
Basophils Absolute: 0.1 10*3/uL (ref 0.0–0.1)
Basophils Relative: 1 %
Eosinophils Absolute: 0.2 10*3/uL (ref 0.0–0.5)
Eosinophils Relative: 4 %
HCT: 42.1 % (ref 36.0–46.0)
Hemoglobin: 13.5 g/dL (ref 12.0–15.0)
Immature Granulocytes: 0 %
Lymphocytes Relative: 40 %
Lymphs Abs: 1.8 10*3/uL (ref 0.7–4.0)
MCH: 32.8 pg (ref 26.0–34.0)
MCHC: 32.1 g/dL (ref 30.0–36.0)
MCV: 102.2 fL — ABNORMAL HIGH (ref 80.0–100.0)
Monocytes Absolute: 0.4 10*3/uL (ref 0.1–1.0)
Monocytes Relative: 10 %
Neutro Abs: 2 10*3/uL (ref 1.7–7.7)
Neutrophils Relative %: 45 %
Platelets: 281 10*3/uL (ref 150–400)
RBC: 4.12 MIL/uL (ref 3.87–5.11)
RDW: 14 % (ref 11.5–15.5)
WBC: 4.4 10*3/uL (ref 4.0–10.5)
nRBC: 0 % (ref 0.0–0.2)

## 2019-04-16 LAB — COMPREHENSIVE METABOLIC PANEL
ALT: 32 U/L (ref 0–44)
AST: 26 U/L (ref 15–41)
Albumin: 3.9 g/dL (ref 3.5–5.0)
Alkaline Phosphatase: 90 U/L (ref 38–126)
Anion gap: 8 (ref 5–15)
BUN: 21 mg/dL (ref 8–23)
CO2: 30 mmol/L (ref 22–32)
Calcium: 9.3 mg/dL (ref 8.9–10.3)
Chloride: 105 mmol/L (ref 98–111)
Creatinine, Ser: 1.4 mg/dL — ABNORMAL HIGH (ref 0.44–1.00)
GFR calc Af Amer: 40 mL/min — ABNORMAL LOW (ref >60)
GFR calc non Af Amer: 35 mL/min — ABNORMAL LOW (ref >60)
Glucose, Bld: 115 mg/dL — ABNORMAL HIGH (ref 70–99)
Potassium: 4 mmol/L (ref 3.5–5.1)
Sodium: 143 mmol/L (ref 135–145)
Total Bilirubin: 0.9 mg/dL (ref 0.3–1.2)
Total Protein: 7.5 g/dL (ref 6.5–8.1)

## 2019-04-16 LAB — D-DIMER, QUANTITATIVE: D-Dimer, Quant: 1.12 ug/mL-FEU — ABNORMAL HIGH (ref 0.00–0.50)

## 2019-04-20 ENCOUNTER — Ambulatory Visit: Payer: Medicare Other | Attending: Internal Medicine

## 2019-04-20 DIAGNOSIS — Z23 Encounter for immunization: Secondary | ICD-10-CM | POA: Insufficient documentation

## 2019-04-20 NOTE — Progress Notes (Signed)
   Covid-19 Vaccination Clinic  Name:  [name]    MRN: [id number] DOB: 1934/05/21  04/20/2019  [name] was observed post Covid-19 immunization for 15 minutes without incidence. She was provided with Vaccine Information Sheet and instruction to access the V-Safe system.   [name] was instructed to call 911 with any severe reactions post vaccine: Marland Kitchen Difficulty breathing  . Swelling of your face and throat  . A fast heartbeat  . A bad rash all over your body  . Dizziness and weakness    Immunizations Administered    Name Date Dose VIS Date Route   Moderna COVID-19 Vaccine 04/20/2019 12:34 PM 0.5 mL 02/12/2019 Intramuscular   Manufacturer: Moderna   Lot: [id number]   [id number]

## 2019-04-23 ENCOUNTER — Other Ambulatory Visit: Payer: Self-pay

## 2019-04-23 ENCOUNTER — Encounter (HOSPITAL_COMMUNITY): Payer: Self-pay | Admitting: Hematology

## 2019-04-23 ENCOUNTER — Inpatient Hospital Stay (HOSPITAL_COMMUNITY): Payer: Medicare Other | Attending: Nurse Practitioner | Admitting: Hematology

## 2019-04-23 VITALS — BP 152/75 | HR 72 | Temp 98.6°F | Resp 17 | Wt 164.5 lb

## 2019-04-23 DIAGNOSIS — Z7982 Long term (current) use of aspirin: Secondary | ICD-10-CM | POA: Insufficient documentation

## 2019-04-23 DIAGNOSIS — I2699 Other pulmonary embolism without acute cor pulmonale: Secondary | ICD-10-CM | POA: Diagnosis not present

## 2019-04-23 DIAGNOSIS — Z86711 Personal history of pulmonary embolism: Secondary | ICD-10-CM | POA: Insufficient documentation

## 2019-04-23 DIAGNOSIS — Z85038 Personal history of other malignant neoplasm of large intestine: Secondary | ICD-10-CM | POA: Insufficient documentation

## 2019-04-23 NOTE — Progress Notes (Signed)
[name], [name]   CLINIC:  Medical Oncology/Hematology  PCP:  Alycia Rossetti, MD [address] [place] [address] [phone]   REASON FOR VISIT:  Follow-up for pulmonary embolism.  CURRENT THERAPY: Aspirin 325 mg daily.  BRIEF ONCOLOGIC HISTORY:  Oncology History   No history exists.     CANCER STAGING: Cancer Staging Colon cancer [facility]) Staging form: Colon and Rectum, AJCC 7th Edition - Clinical: Stage I (T1, N0, M0) - Unsigned    INTERVAL HISTORY:  [name] 85 y.o. female seen for follow-up for pulmonary embolism and history of colon cancer.  She was hospitalized in September 2020 and was found to have small punctate focus of hemorrhage in the frontal lobe, measuring about 4 mm.  She was discontinued Eliquis.  She has been taking full dose aspirin since December 2020.  She is able to do all her ADLs at home.  Denies any fevers, night sweats or weight loss.    REVIEW OF SYSTEMS:  Review of Systems  Respiratory: Positive for shortness of breath.   Cardiovascular: Positive for palpitations.  All other systems reviewed and are negative.    PAST MEDICAL/SURGICAL HISTORY:  Past Medical History:  Diagnosis Date  . Allergy   . Arthritis   . Colon cancer ([place])    colon ca dx 07/30/09  . History of cardiac monitoring 07/2017   "Event monitor demonstrated sinus rhythm with isolated PACs and no arrhythmias"  . History of colon cancer 06/2009   found at time of TCS 06/29/09, 1.2cm sessile cecal polyp, no adjuvent therapy needed  . HTN (hypertension)   . Hx of cardiovascular stress test 07/2017   "No diagnostic ST segment changes to indicate ischemia. Small, moderate intensity, reversible apical to basal inferolateral defect consistent with ischemia. This is a low risk study. Nuclear stress EF: 84%."  . Hyperlipidemia   . Hypothyroidism   . PE (pulmonary thromboembolism) ([place])   . Renal disorder    cyst on kidney   . Stroke ([place])   . Vertigo    Past Surgical History:  Procedure Laterality Date  . ABDOMINAL HYSTERECTOMY    . COLON SURGERY  07/2009   right hemicolectomy, no residual colon cancer on path  . COLONOSCOPY  07/16/2010   [id number] POLYP-TCS 3 YEARS  . COLONOSCOPY N/A 08/02/2013   hyperplastic polyps, surveillance in 2020 if benefits outweight the risks  . COLONOSCOPY  06/2009   1.2 cm sessile cecal polyp which had adenocarcinoma arising in a tubular adenoma.  Marland Kitchen PARTIAL THYMECTOMY    . partial thyroidectomy     benign tumors     SOCIAL HISTORY:  Social History   Socioeconomic History  . Marital status: Widowed    Spouse name: Not on file  . Number of children: 2  . Years of education: Not on file  . Highest education level: Not on file  Occupational History  . Occupation: Psychologist, occupational at [employer]  . Occupation: retired from [employer]  Tobacco Use  . Smoking status: Never Smoker  . Smokeless tobacco: Never Used  Substance and Sexual Activity  . Alcohol use: No    Alcohol/week: 0.0 standard drinks  . Drug use: No  . Sexual activity: Not Currently  Other Topics Concern  . Not on file  Social History Narrative  . Not on file   Social Determinants of Health   Financial Resource Strain:   .  Difficulty of Paying Living Expenses: Not on file  Food Insecurity:   . Worried About Char[employer] fundraiser in the Last Year: Not on file  . Ran Out of Food in the Last Year: Not on file  Transportation Needs:   . Lack of Transportation (Medical): Not on file  . Lack of Transportation (Non-Medical): Not on file  Physical Activity:   . Days of Exercise per Week: Not on file  . Minutes of Exercise per Session: Not on file  Stress:   . Feeling of Stress : Not on file  Social Connections:   . Frequency of Communication with Friends and Family: Not on file  . Frequency of Social Gatherings with Friends and Family: Not on file  . Attends Religious  Services: Not on file  . Active Member of Clubs or Organizations: Not on file  . Attends Archivist Meetings: Not on file  . Marital Status: Not on file  Intimate Partner Violence:   . Fear of Current or Ex-Partner: Not on file  . Emotionally Abused: Not on file  . Physically Abused: Not on file  . Sexually Abused: Not on file    FAMILY HISTORY:  Family History  Problem Relation Age of Onset  . Colon cancer Mother        >[id number]  . Arthritis Mother   . Cancer Mother   . Heart disease Mother   . Hyperlipidemia Mother   . Hypertension Mother   . Heart attack Father   . Heart disease Father   . Diabetes Maternal Aunt   . Hyperlipidemia Daughter   . Hypertension Daughter   . Liver disease Neg Hx     CURRENT MEDICATIONS:  Outpatient Encounter Medications as of 04/23/2019  Medication Sig  . amLODipine (NORVASC) 5 MG tablet Take 1 tablet (5 mg total) by mouth daily.  Marland Kitchen atorvastatin (LIPITOR) 40 MG tablet Take 2 tablets (80 mg total) by mouth daily.  . cholecalciferol (VITAMIN D3) 25 MCG (1000 UT) tablet Take 1,000 Units by mouth daily.   Marland Kitchen gabapentin (NEURONTIN) 300 MG capsule Take 1 in the morning and 1 in the evening  . levETIRAcetam (KEPPRA) 250 MG tablet Take 1 tablet (250 mg total) by mouth 2 (two) times daily. (Patient taking differently: Take 125 mg by mouth 2 (two) times daily. Patient can only tolerate 1/2 tab)  . linaclotide (LINZESS) 72 MCG capsule TAKE 1 CAPSULE BY MOUTH DAILY BEFORE BREAKFAST. (Patient taking differently: Take 72 mcg by mouth every other day. )  . metoprolol tartrate (LOPRESSOR) 25 MG tablet Take 1.5 tablets (37.5 mg total) by mouth 2 (two) times daily.  Marland Kitchen acetaminophen (TYLENOL) 500 MG tablet Take 1,000 mg by mouth every 8 (eight) hours as needed (cold).  . ASPERCREME LIDOCAINE EX Apply 1 application topically daily as needed.  . RESTASIS 0.05 % ophthalmic emulsion Place 1 drop into both eyes 2 (two) times daily as needed (chronic dry eye).   .  traMADol (ULTRAM) 50 MG tablet Take 50 mg by mouth every 8 (eight) hours as needed.   No facility-administered encounter medications on file as of 04/23/2019.    ALLERGIES:  No Known Allergies   PHYSICAL EXAM:  ECOG Performance status: 2  Vitals:   04/23/19 1552  BP: (!) 152/75  Pulse: 72  Resp: 17  Temp: 98.6 F (37 C)  SpO2: 98%   Filed Weights   04/23/19 1552  Weight: 164 lb 8 oz (74.6 kg)    Physical Exam  Vitals reviewed.  Constitutional:      Appearance: Normal appearance.  Cardiovascular:     Rate and Rhythm: Normal rate and regular rhythm.  Pulmonary:     Effort: Pulmonary effort is normal.     Breath sounds: Normal breath sounds.  Abdominal:     General: There is no distension.     Palpations: Abdomen is soft. There is no mass.  Musculoskeletal:        General: No swelling.  Skin:    General: Skin is warm.  Neurological:     General: No focal deficit present.     Mental Status: She is alert and oriented to person, place, and time.  Psychiatric:        Mood and Affect: Mood normal.        Behavior: Behavior normal.      LABORATORY DATA:  I have reviewed the labs as listed.  CBC    Component Value Date/Time   WBC 4.4 04/16/2019 0833   RBC 4.12 04/16/2019 0833   HGB 13.5 04/16/2019 0833   HGB 14.7 04/16/2015 0812   HCT 42.1 04/16/2019 0833   HCT 44.2 04/16/2015 0812   PLT 281 04/16/2019 0833   PLT 332 04/16/2015 0812   MCV 102.2 (H) 04/16/2019 0833   MCV 93.8 04/16/2015 0812   MCH 32.8 04/16/2019 0833   MCHC 32.1 04/16/2019 0833   RDW 14.0 04/16/2019 0833   RDW 15.0 (H) 04/16/2015 0812   LYMPHSABS 1.8 04/16/2019 0833   LYMPHSABS 1.9 04/16/2015 0812   MONOABS 0.4 04/16/2019 0833   MONOABS 0.4 04/16/2015 0812   EOSABS 0.2 04/16/2019 0833   EOSABS 0.2 04/16/2015 0812   BASOSABS 0.1 04/16/2019 0833   BASOSABS 0.1 04/16/2015 0812   CMP Latest Ref Rng & Units 04/16/2019 02/28/2019 02/05/2019  Glucose 70 - 99 mg/dL 115(H) 96 102(H)  BUN 8 -  23 mg/dL 21 22 21   Creatinine 0.44 - 1.00 mg/dL 1.40(H) 1.30(H) 1.45(H)  Sodium 135 - 145 mmol/L 143 141 139  Potassium 3.5 - 5.1 mmol/L 4.0 3.7 4.2  Chloride 98 - 111 mmol/L 105 103 104  CO2 22 - 32 mmol/L 30 28 27   Calcium 8.9 - 10.3 mg/dL 9.3 9.8 9.0  Total Protein 6.5 - 8.1 g/dL 7.5 7.9 7.7  Total Bilirubin 0.3 - 1.2 mg/dL 0.9 0.9 0.7  Alkaline Phos 38 - 126 U/L 90 99 97  AST 15 - 41 U/L 26 21 21   ALT 0 - 44 U/L 32 23 24       DIAGNOSTIC IMAGING:  I have independently reviewed the scans and discussed with the patient.     ASSESSMENT & PLAN:   History of pulmonary embolism 1.  Unprovoked pulmonary embolism: -CT PE protocol on 04/26/2018 showed bilateral pulmonary emboli. -She was treated with Eliquis until her hospitalization on 11/25/2018 when she was diagnosed with a 4 mm frontal hemorrhage. -Eliquis was discontinued.  She started taking aspirin 325 mg in December 2020. -Because of her high risk of bleeding, we will continue aspirin 325 mg at this time for thromboprophylaxis. -We reviewed her labs.  Her D-dimer is elevated but stable at 1.1.  Previously this was 1.2 in November 2020. -We will reassess her in 6 months with repeat labs.  2.  Colon cancer: -Cecal polypectomy on 06/29/2009 with adenocarcinoma arising in a tubular adenoma. -Right colon segmental resection with 0/25 lymph nodes positive and no residual cancer. -Last colonoscopy on 08/02/2013 showed hyperplastic polyp in the rectum and sigmoid  colon. -CT of the abdomen and pelvis on 05/28/2018 did not show any evidence of malignancy.  She did not have any significant weight loss.      Orders placed this encounter:  Orders Placed This Encounter  Procedures  . CBC with Differential/Platelet  . Comprehensive metabolic panel  . D-dimer, quantitative      Derek Jack, MD [place] [phone]

## 2019-04-23 NOTE — Patient Instructions (Addendum)
[place] at [facility] Discharge Instructions  You were seen today by Dr. Delton Coombes. He went over your recent lab results. Continue to be active at home to help keep the blood flowing. Drink lots of water. He will see you back in 6 months for labs and follow up.   Thank you for choosing [place] at [facility] to provide your oncology and hematology care.  To afford each patient quality time with our provider, please arrive at least 15 minutes before your scheduled appointment time.   If you have a lab appointment with the [facility] please come in thru the  Main Entrance and check in at the main information desk  You need to re-schedule your appointment should you arrive 10 or more minutes late.  We strive to give you quality time with our providers, and arriving late affects you and other patients whose appointments are after yours.  Also, if you no show three or more times for appointments you may be dismissed from the clinic at the providers discretion.     Again, thank you for choosing [facility].  Our hope is that these requests will decrease the amount of time that you wait before being seen by our physicians.       _____________________________________________________________  Should you have questions after your visit to [facility], please contact our office at [phone] between the hours of 8:00 a.m. and 4:30 p.m.  Voicemails left after 4:00 p.m. will not be returned until the following business day.  For prescription refill requests, have your pharmacy contact our office and allow 72 hours.    Cancer Center Support Programs:   > Cancer Support Group  2nd Tuesday of the month 1pm-2pm, Journey Room

## 2019-04-23 NOTE — Assessment & Plan Note (Signed)
1.  Unprovoked pulmonary embolism: -CT PE protocol on 04/26/2018 showed bilateral pulmonary emboli. -She was treated with Eliquis until her hospitalization on 11/25/2018 when she was diagnosed with a 4 mm frontal hemorrhage. -Eliquis was discontinued.  She started taking aspirin 325 mg in December 2020. -Because of her high risk of bleeding, we will continue aspirin 325 mg at this time for thromboprophylaxis. -We reviewed her labs.  Her D-dimer is elevated but stable at 1.1.  Previously this was 1.2 in November 2020. -We will reassess her in 6 months with repeat labs.  2.  Colon cancer: -Cecal polypectomy on 06/29/2009 with adenocarcinoma arising in a tubular adenoma. -Right colon segmental resection with 0/25 lymph nodes positive and no residual cancer. -Last colonoscopy on 08/02/2013 showed hyperplastic polyp in the rectum and sigmoid colon. -CT of the abdomen and pelvis on 05/28/2018 did not show any evidence of malignancy.  She did not have any significant weight loss.

## 2019-05-04 ENCOUNTER — Other Ambulatory Visit: Payer: Self-pay | Admitting: Family Medicine

## 2019-05-07 ENCOUNTER — Encounter: Payer: Self-pay | Admitting: Family Medicine

## 2019-05-18 ENCOUNTER — Other Ambulatory Visit: Payer: Self-pay | Admitting: Family Medicine

## 2019-05-21 ENCOUNTER — Ambulatory Visit: Payer: Medicare Other | Attending: Internal Medicine

## 2019-05-21 DIAGNOSIS — I679 Cerebrovascular disease, unspecified: Secondary | ICD-10-CM | POA: Diagnosis not present

## 2019-05-21 DIAGNOSIS — Z79899 Other long term (current) drug therapy: Secondary | ICD-10-CM | POA: Diagnosis not present

## 2019-05-21 DIAGNOSIS — G890 Central pain syndrome: Secondary | ICD-10-CM | POA: Diagnosis not present

## 2019-05-21 DIAGNOSIS — Z23 Encounter for immunization: Secondary | ICD-10-CM | POA: Insufficient documentation

## 2019-05-21 NOTE — Progress Notes (Signed)
   Covid-19 Vaccination Clinic  Name:  [name]    MRN: [id number] DOB: 15-Apr-1934  05/21/2019  [name] was observed post Covid-19 immunization for 15 minutes without incident. She was provided with Vaccine Information Sheet and instruction to access the V-Safe system.   [name] was instructed to call 911 with any severe reactions post vaccine: Marland Kitchen Difficulty breathing  . Swelling of face and throat  . A fast heartbeat  . A bad rash all over body  . Dizziness and weakness   Immunizations Administered    Name Date Dose VIS Date Route   Moderna COVID-19 Vaccine 05/21/2019 12:30 PM 0.5 mL 02/12/2019 Intramuscular   Manufacturer: Moderna   Lot: [id number]   [id number]

## 2019-06-01 ENCOUNTER — Emergency Department (HOSPITAL_COMMUNITY): Payer: Medicare Other

## 2019-06-01 ENCOUNTER — Other Ambulatory Visit: Payer: Self-pay

## 2019-06-01 ENCOUNTER — Emergency Department (HOSPITAL_COMMUNITY)
Admission: EM | Admit: 2019-06-01 | Discharge: 2019-06-01 | Disposition: A | Discharge status: Home / Self Care | Payer: Medicare Other | Attending: Emergency Medicine | Admitting: Emergency Medicine

## 2019-06-01 ENCOUNTER — Encounter (HOSPITAL_COMMUNITY): Payer: Self-pay | Admitting: Emergency Medicine

## 2019-06-01 DIAGNOSIS — Z8673 Personal history of transient ischemic attack (TIA), and cerebral infarction without residual deficits: Secondary | ICD-10-CM | POA: Diagnosis not present

## 2019-06-01 DIAGNOSIS — E1122 Type 2 diabetes mellitus with diabetic chronic kidney disease: Secondary | ICD-10-CM | POA: Insufficient documentation

## 2019-06-01 DIAGNOSIS — E039 Hypothyroidism, unspecified: Secondary | ICD-10-CM | POA: Diagnosis not present

## 2019-06-01 DIAGNOSIS — Z85038 Personal history of other malignant neoplasm of large intestine: Secondary | ICD-10-CM | POA: Insufficient documentation

## 2019-06-01 DIAGNOSIS — I129 Hypertensive chronic kidney disease with stage 1 through stage 4 chronic kidney disease, or unspecified chronic kidney disease: Secondary | ICD-10-CM | POA: Insufficient documentation

## 2019-06-01 DIAGNOSIS — N183 Chronic kidney disease, stage 3 unspecified: Secondary | ICD-10-CM | POA: Insufficient documentation

## 2019-06-01 DIAGNOSIS — I2694 Multiple subsegmental pulmonary emboli without acute cor pulmonale: Secondary | ICD-10-CM | POA: Diagnosis not present

## 2019-06-01 DIAGNOSIS — I2699 Other pulmonary embolism without acute cor pulmonale: Secondary | ICD-10-CM | POA: Diagnosis not present

## 2019-06-01 DIAGNOSIS — Z79899 Other long term (current) drug therapy: Secondary | ICD-10-CM | POA: Diagnosis not present

## 2019-06-01 DIAGNOSIS — M546 Pain in thoracic spine: Secondary | ICD-10-CM | POA: Diagnosis present

## 2019-06-01 DIAGNOSIS — R0602 Shortness of breath: Secondary | ICD-10-CM | POA: Insufficient documentation

## 2019-06-01 DIAGNOSIS — Z7982 Long term (current) use of aspirin: Secondary | ICD-10-CM | POA: Insufficient documentation

## 2019-06-01 LAB — CBC
HCT: 44 % (ref 36.0–46.0)
Hemoglobin: 13.9 g/dL (ref 12.0–15.0)
MCH: 31.6 pg (ref 26.0–34.0)
MCHC: 31.6 g/dL (ref 30.0–36.0)
MCV: 100 fL (ref 80.0–100.0)
Platelets: 282 10*3/uL (ref 150–400)
RBC: 4.4 MIL/uL (ref 3.87–5.11)
RDW: 13.3 % (ref 11.5–15.5)
WBC: 5 10*3/uL (ref 4.0–10.5)
nRBC: 0 % (ref 0.0–0.2)

## 2019-06-01 LAB — BASIC METABOLIC PANEL
Anion gap: 8 (ref 5–15)
BUN: 21 mg/dL (ref 8–23)
CO2: 28 mmol/L (ref 22–32)
Calcium: 9.1 mg/dL (ref 8.9–10.3)
Chloride: 104 mmol/L (ref 98–111)
Creatinine, Ser: 1.36 mg/dL — ABNORMAL HIGH (ref 0.44–1.00)
GFR calc Af Amer: 42 mL/min — ABNORMAL LOW (ref >60)
GFR calc non Af Amer: 36 mL/min — ABNORMAL LOW (ref >60)
Glucose, Bld: 86 mg/dL (ref 70–99)
Potassium: 3.7 mmol/L (ref 3.5–5.1)
Sodium: 140 mmol/L (ref 135–145)

## 2019-06-01 LAB — PROTIME-INR
INR: 1 (ref 0.8–1.2)
Prothrombin Time: 12.9 seconds (ref 11.4–15.2)

## 2019-06-01 MED ORDER — IOHEXOL 350 MG/ML SOLN
80.0000 mL | Freq: Once | INTRAVENOUS | Status: AC | PRN
  Administered 2019-06-01: 80 mL via INTRAVENOUS

## 2019-06-01 NOTE — Discharge Instructions (Addendum)
You have an appointment here for an ultrasound at 9:00 AM tomorrow morning. Get help right away if: You have: New or increased pain, swelling, warmth, or redness in an arm or leg. Numbness or tingling in an arm or leg. Shortness of breath during activity or at rest. A fever. Chest pain. A rapid or irregular heartbeat. A severe headache. Vision changes. A serious fall or accident, or you hit your head. Stomach (abdominal) pain. Blood in your vomit, stool, or urine. A cut that will not stop bleeding. You cough up blood. You feel light-headed or dizzy. You cannot move your arms or legs. You are confused or have memory loss.

## 2019-06-01 NOTE — ED Notes (Signed)
ED Provider at bedside. 

## 2019-06-01 NOTE — ED Provider Notes (Signed)
[facility] EMERGENCY DEPARTMENT Provider Note   CSN: [id number] Arrival date & time: 06/01/19  1152     History Chief Complaint  Patient presents with  . Back Pain  . Shortness of Breath    [name] is a 85 y.o. female with a pmh of colon cancer and previous pe. She had sudden onset left, sharp, pleuritic posterior thoracic pain. Similar to previous R sided PE. She is on 325 of ASA daily. She took another 160 of asa without improvement. She had some new exertional dyspnea. She is concerned for new PE. She denies hemoptysis, UL leg swelling She denies fever, chills, cough.  HPI     Past Medical History:  Diagnosis Date  . Allergy   . Arthritis   . Colon cancer ([place])    colon ca dx 07/30/09  . History of cardiac monitoring 07/2017   "Event monitor demonstrated sinus rhythm with isolated PACs and no arrhythmias"  . History of colon cancer 06/2009   found at time of TCS 06/29/09, 1.2cm sessile cecal polyp, no adjuvent therapy needed  . HTN (hypertension)   . Hx of cardiovascular stress test 07/2017   "No diagnostic ST segment changes to indicate ischemia. Small, moderate intensity, reversible apical to basal inferolateral defect consistent with ischemia. This is a low risk study. Nuclear stress EF: 84%."  . Hyperlipidemia   . Hypothyroidism   . PE (pulmonary thromboembolism) ([place])   . Renal disorder    cyst on kidney   . Stroke ([place])   . Vertigo     Patient Active Problem List   Diagnosis Date Noted  . Diabetes mellitus without complication ([place]) [id number]  . Complex partial seizure ([place]) 03/02/2019  . Left hemiparesis ([place]) 03/01/2019  . Sensory disturbance 03/01/2019  . TIA (transient ischemic attack) 11/25/2018  . Silent micro-hemorrhage of brain ([place]) 11/25/2018  . PVC (premature ventricular contraction) 11/16/2018  . DDD (degenerative disc disease), cervical 08/16/2018  . Hyperlipemia 08/16/2018  . History of pulmonary embolism 04/26/2018  .  Insomnia 12/15/2015  . Paresthesia of left upper and lower extremity 11/21/2015  . Left leg weakness 11/21/2015  . Paresthesias   . Essential hypertension 11/02/2015  . Hypothyroidism 11/02/2015  . Vertigo 11/02/2015  . CKD (chronic kidney disease), stage III ([place]) 11/02/2015  . Loss of weight 11/02/2015  . OA (osteoarthritis) of knee 11/02/2015  . Colon cancer ([place]) 02/28/2011  . History of colon cancer 06/29/2010  . Constipation 06/10/2009    Past Surgical History:  Procedure Laterality Date  . ABDOMINAL HYSTERECTOMY    . COLON SURGERY  07/2009   right hemicolectomy, no residual colon cancer on path  . COLONOSCOPY  07/16/2010   [id number] POLYP-TCS 3 YEARS  . COLONOSCOPY N/A 08/02/2013   hyperplastic polyps, surveillance in 2020 if benefits outweight the risks  . COLONOSCOPY  06/2009   1.2 cm sessile cecal polyp which had adenocarcinoma arising in a tubular adenoma.  Marland Kitchen PARTIAL THYMECTOMY    . partial thyroidectomy     benign tumors     OB History    Gravida  3   Para  2   Term  1   Preterm  1   AB  1   Living  2     SAB  1   TAB      Ectopic      Multiple      Live Births              Family History  Problem Relation Age of Onset  . Colon cancer Mother        >age60  . Arthritis Mother   . Cancer Mother   . Heart disease Mother   . Hyperlipidemia Mother   . Hypertension Mother   . Heart attack Father   . Heart disease Father   . Diabetes Maternal Aunt   . Hyperlipidemia Daughter   . Hypertension Daughter   . Liver disease Neg Hx     Social History   Tobacco Use  . Smoking status: Never Smoker  . Smokeless tobacco: Never Used  Substance Use Topics  . Alcohol use: No    Alcohol/week: 0.0 standard drinks  . Drug use: No    Home Medications Prior to Admission medications   Medication Sig Start Date End Date Taking? Authorizing Provider  acetaminophen (TYLENOL) 500 MG tablet Take 1,000 mg by mouth every 8 (eight) hours as  needed for mild pain or headache.    Yes [provider]  amLODipine (NORVASC) 5 MG tablet Take 1 tablet (5 mg total) by mouth daily. 12/05/18  Yes [place], Modena Nunnery, MD  ASPERCREME LIDOCAINE EX Apply 1 application topically daily as needed.   Yes [provider]  aspirin 325 MG EC tablet Take 162.5-325 mg by mouth daily.   Yes [provider]  atorvastatin (LIPITOR) 40 MG tablet TAKE 2 TABLETS BY MOUTH ONCE A DAY. Patient taking differently: Take 80 mg by mouth every evening.  05/20/19  Yes [place], Modena Nunnery, MD  cholecalciferol (VITAMIN D3) 25 MCG (1000 UT) tablet Take 1,000 Units by mouth daily.    Yes [provider]  gabapentin (NEURONTIN) 300 MG capsule Take 1 in the morning and 1 in the evening Patient taking differently: Take 300 mg by mouth 2 (two) times daily. Take 1 in the morning and 1 in the evening 10/26/18  Yes [place], Modena Nunnery, MD  levETIRAcetam (KEPPRA) 250 MG tablet Take 1 tablet (250 mg total) by mouth 2 (two) times daily. Patient taking differently: Take 125 mg by mouth 2 (two) times daily. Patient can only tolerate 1/2 tab 03/02/19  Yes Tat, David, MD  LINZESS 72 MCG capsule TAKE 1 CAPSULE BY MOUTH DAILY BEFORE BREAKFAST. Patient taking differently: Take 72 mcg by mouth daily before breakfast.  05/06/19  Yes [place], Modena Nunnery, MD  metoprolol tartrate (LOPRESSOR) 25 MG tablet Take 1.5 tablets (37.5 mg total) by mouth 2 (two) times daily. 11/13/18  Yes [place], [place] M, PA-C  RESTASIS 0.05 % ophthalmic emulsion Place 1 drop into both eyes 2 (two) times daily as needed (chronic dry eye).  05/17/10  Yes [provider]  traMADol (ULTRAM) 50 MG tablet Take 25 mg by mouth every 8 (eight) hours as needed.  01/10/19  Yes [provider]    Allergies    Patient has no known allergies.  Review of Systems   Review of Systems Ten systems reviewed and are negative for acute change, except as noted in the HPI.   Physical Exam Updated  Vital Signs BP (!) 147/72   Pulse 61   Temp 98.8 F (37.1 C) (Oral)   Resp 16   Ht 5\' 4"  (1.626 m)   Wt 74.6 kg   SpO2 100%   BMI 28.23 kg/m   Physical Exam Vitals and nursing note reviewed.  Constitutional:      General: She is not in acute distress.    Appearance: She is well-developed. She is not diaphoretic.  HENT:  Head: Normocephalic and atraumatic.  Eyes:     General: No scleral icterus.    Conjunctiva/sclera: Conjunctivae normal.  Cardiovascular:     Rate and Rhythm: Normal rate and regular rhythm.     Heart sounds: Normal heart sounds. No murmur. No friction rub. No gallop.   Pulmonary:     Effort: Pulmonary effort is normal. No respiratory distress.     Breath sounds: Normal breath sounds.  Abdominal:     General: Bowel sounds are normal. There is no distension.     Palpations: Abdomen is soft. There is no mass.     Tenderness: There is no abdominal tenderness. There is no guarding.  Musculoskeletal:     Cervical back: Normal range of motion.     Right lower leg: No edema.     Left lower leg: No edema.  Skin:    General: Skin is warm and dry.  Neurological:     Mental Status: She is alert and oriented to person, place, and time.  Psychiatric:        Behavior: Behavior normal.     ED Results / Procedures / Treatments   Labs (all labs ordered are listed, but only abnormal results are displayed) Labs Reviewed  BASIC METABOLIC PANEL - Abnormal; Notable for the following components:      Result Value   Creatinine, Ser 1.36 (*)    GFR calc non Af Amer 36 (*)    GFR calc Af Amer 42 (*)    All other components within normal limits  CBC  PROTIME-INR    EKG EKG Interpretation  Date/Time:  Saturday June 01 2019 12:10:52 EDT Ventricular Rate:  62 PR Interval:    QRS Duration: 86 QT Interval:  413 QTC Calculation: 420 R Axis:   -33 Text Interpretation: Sinus rhythm Left axis deviation Low voltage, precordial leads Abnormal R-wave progression,  early transition Borderline T wave abnormalities No significant change since last tracing Confirmed by Fredia Sorrow [phone]) on 06/01/2019 12:17:22 PM   Radiology CT Angio Chest PE W and/or Wo Contrast  Result Date: 06/01/2019 CLINICAL DATA:  Left back pain since this morning. History of pulmonary emboli, colon carcinoma, stroke, partial thymectomy EXAM: CT ANGIOGRAPHY CHEST WITH CONTRAST TECHNIQUE: Multidetector CT imaging of the chest was performed using the standard protocol during bolus administration of intravenous contrast. Multiplanar CT image reconstructions and MIPs were obtained to evaluate the vascular anatomy. CONTRAST:  58mL OMNIPAQUE IOHEXOL 350 MG/ML SOLN COMPARISON:  04/26/2018 FINDINGS: Cardiovascular: Mild four-chamber cardiac enlargement. There is good contrast opacification of the pulmonary arterial tree. Filling defects in subsegmental branches of right upper lobe pulmonary artery, identical distribution to larger central embolus seen on prior study, suggesting chronic PE although the CT appearance is nonspecific. No definite new pulmonary emboli in previously uninvolved areas. Resolution of the bilateral lower lobe emboli seen on the prior study. Coronary calcifications. Adequate contrast opacification of the thoracic aorta with no evidence of dissection, aneurysm, or stenosis. There is classic 3-vessel brachiocephalic arch anatomy without proximal stenosis. Visualized proximal abdominal aorta unremarkable. Mediastinum/Nodes: Prominent left axillary lymph nodes. No hilar or mediastinal adenopathy. Stable left thyroid enlargement which has previously been evaluated with ultrasound biopsy; no follow-up recommended. Lungs/Pleura: No pleural effusion. No pneumothorax. Stable linear scarring/atelectasis in bilateral infrahilar regions. Upper Abdomen: Stable right renal cysts. Probable subcentimeter layering calculi in the dependent aspect of the nondilated gallbladder. No acute findings.  Musculoskeletal: Anterior vertebral endplate spurring at multiple levels in the mid and lower thoracic spine.  No fracture or worrisome bone lesion. Review of the MIP images confirms the above findings. IMPRESSION: 1. Subsegmental right upper lobe pulmonary emboli, identical distribution to larger central embolus seen on prior study, and possibly chronic. No definite new pulmonary emboli. 2. Atherosclerosis, including coronary artery disease. Electronically Signed   By: Lucrezia Europe M.D.   On: 06/01/2019 14:05   DG Chest Port 1 View  Result Date: 06/01/2019 CLINICAL DATA:  Rib pain and shortness of breath. UPPER back pain on the LEFT started this morning. Feels the same as when she had a PD last year. EXAM: PORTABLE CHEST 1 VIEW COMPARISON:  11/25/2018 FINDINGS: Heart size is accentuated by portable technique and probably mildly enlarged, stable. No focal consolidations or pleural effusions. No pulmonary edema. IMPRESSION: Stable cardiomegaly. Electronically Signed   By: Nolon Nations M.D.   On: 06/01/2019 13:10    Procedures Procedures (including critical care time)  Medications Ordered in ED Medications  iohexol (OMNIPAQUE) 350 MG/ML injection 80 mL (80 mLs Intravenous Contrast Given 06/01/19 1316)    ED Course  I have reviewed the triage vital signs and the nursing notes.  Pertinent labs & imaging results that were available during my care of the patient were reviewed by me and considered in my medical decision making (see chart for details).    MDM Rules/Calculators/A&P                      This is an 85 year old female with a past medical history of colon cancer, previous pulmonary embolus and hemorrhagic stroke.  She was taken off of her Eliquis during the stroke event and is currently been on 325 of aspirin daily.  She has been following with [facility] hematology oncology.  Presented for acute sharp posterior thoracic left-sided chest pain concerning for pulmonary embolus.   Differential also includes neuropathy, muscle spasm.  Her pain is resolved.  I personally reviewed the patient's labs which shows baseline renal insufficiency without other abnormality.  Patient's PT/INR and CBC are within normal limits.  Personally reviewed the patient's 1 view chest x-ray which shows no acute abnormalities.  EKG shows normal sinus rhythm at a rate of 68 without change from previous tracings.  Patient CT angiogram shows multi subsegmental right-sided pulmonary emboli in the distribution of previous large PE.  These appear chronic.  I discussed the case with Dr. Delton Coombes who recommends that overall the patient should stay on aspirin however she should have bilateral lower extremity ultrasounds to make sure she does not have any DVT spreading emboli into the lungs.  He recommended overnight dose of Lovenox.  In discussion with the patient and her daughter who was at bedside and in shared decision-making the patient felt that the risk of bleeding from her head would outweigh the risk of potential threat from pulmonary embolus and she would rather wait to get her ultrasounds tomorrow morning then take the risk of taking Lovenox overnight.  I think this is reasonable given the fact that the emboli do appear chronic.  She had pain presenting on the left side and the emboli are on the right.  The patient was ambulated with oxygen saturations above 90%.  She will return tomorrow morning at 9 AM for bilateral lower extremit ultrasound.  She is to continue taking her daily aspirin.  She appears appropriate for discharge at this time. Final Clinical Impression(s) / ED Diagnoses Final diagnoses:  Multiple subsegmental pulmonary emboli without acute cor pulmonale ([place])  Rx / DC Orders ED Discharge Orders         Ordered    US Venous Img Lower Unilateral Left     06/01/19 1552    US Venous Img Lower Unilateral Right     06/01/19 1552           Margarita Mail, PA-C 06/01/19 2200     Fredia Sorrow, MD 06/03/19 [phone]

## 2019-06-01 NOTE — ED Triage Notes (Signed)
Patient complains of upper back pain on left side that started this morning. She states that the pain feels the same as when she had a PE last year. Patient with history of Colon Cancer and right sided PE.

## 2019-06-01 NOTE — ED Provider Notes (Signed)
Medical screening examination/treatment/procedure(s) were conducted as a shared visit with non-physician practitioner(s) and myself.  I personally evaluated the patient during the encounter.  EKG Interpretation  Date/Time:  Saturday June 01 2019 12:10:52 EDT Ventricular Rate:  62 PR Interval:    QRS Duration: 86 QT Interval:  413 QTC Calculation: 420 R Axis:   -33 Text Interpretation: Sinus rhythm Left axis deviation Low voltage, precordial leads Abnormal R-wave progression, early transition Borderline T wave abnormalities No significant change since last tracing Confirmed by Fredia Sorrow [phone]) on 06/01/2019 12:17:22 PM   Patient seen by me along with physician assistant.  Patient presenting with concerns that she may have a recurrent pulmonary embolus.  Did have an episode about a year ago.  Patient had a brief episode of some left back pain.  That was behind the chest area.  Associated with a little bit of exertional shortness of breath.  Not hypoxic here not tachycardic here no leg swelling.  CT angio chest shows evidence of chronic pulmonary embolus.  Will discuss with pulmonary medicine to see if there is anything acutely that we need to do about this.  May just be representative of a chronic pulmonary embolism radiology feels that there is nothing new or acute.   Fredia Sorrow, MD 06/01/19 1430

## 2019-06-01 NOTE — ED Notes (Signed)
Pt. Pulse Ox dropped to 92 while ambulating.

## 2019-06-02 ENCOUNTER — Other Ambulatory Visit (HOSPITAL_COMMUNITY): Payer: Self-pay | Admitting: Emergency Medicine

## 2019-06-02 ENCOUNTER — Ambulatory Visit (HOSPITAL_COMMUNITY)
Admission: RE | Admit: 2019-06-02 | Discharge: 2019-06-02 | Disposition: A | Discharge status: Home / Self Care | Payer: Medicare Other | Source: Ambulatory Visit | Attending: Emergency Medicine | Admitting: Emergency Medicine

## 2019-06-02 ENCOUNTER — Ambulatory Visit (HOSPITAL_COMMUNITY): Admit: 2019-06-02 | Payer: Medicare Other

## 2019-06-02 ENCOUNTER — Encounter (HOSPITAL_COMMUNITY): Payer: Self-pay

## 2019-06-02 DIAGNOSIS — I2699 Other pulmonary embolism without acute cor pulmonale: Secondary | ICD-10-CM

## 2019-06-02 DIAGNOSIS — Z86711 Personal history of pulmonary embolism: Secondary | ICD-10-CM | POA: Diagnosis not present

## 2019-06-02 NOTE — ED Provider Notes (Signed)
   Patient returned here this morning for previously scheduled outpatient venous ultrasound of the bilateral lower extremities.  Patient notified that imaging was negative for DVT of the lower extremities.  She agrees to follow-up with PCP tomorrow.  All questions were answered.    US Venous Img Lower Bilateral (DVT)  Result Date: 06/02/2019 CLINICAL DATA:  History of pulmonary embolus. EXAM: BILATERAL LOWER EXTREMITY VENOUS DOPPLER ULTRASOUND TECHNIQUE: Gray-scale sonography with graded compression, as well as color Doppler and duplex ultrasound were performed to evaluate the lower extremity deep venous systems from the level of the common femoral vein and including the common femoral, femoral, profunda femoral, popliteal and calf veins including the posterior tibial, peroneal and gastrocnemius veins when visible. The superficial great saphenous vein was also interrogated. Spectral Doppler was utilized to evaluate flow at rest and with distal augmentation maneuvers in the common femoral, femoral and popliteal veins. COMPARISON:  April 27, 2018. FINDINGS: RIGHT LOWER EXTREMITY Common Femoral Vein: No evidence of thrombus. Normal compressibility, respiratory phasicity and response to augmentation. Saphenofemoral Junction: No evidence of thrombus. Normal compressibility and flow on color Doppler imaging. Profunda Femoral Vein: No evidence of thrombus. Normal compressibility and flow on color Doppler imaging. Femoral Vein: No evidence of thrombus. Normal compressibility, respiratory phasicity and response to augmentation. Popliteal Vein: No evidence of thrombus. Normal compressibility, respiratory phasicity and response to augmentation. Calf Veins: No evidence of thrombus. Normal compressibility and flow on color Doppler imaging. Superficial Great Saphenous Vein: No evidence of thrombus. Normal compressibility. Venous Reflux:  None. Other Findings:  None. LEFT LOWER EXTREMITY Common Femoral Vein: No evidence  of thrombus. Normal compressibility, respiratory phasicity and response to augmentation. Saphenofemoral Junction: No evidence of thrombus. Normal compressibility and flow on color Doppler imaging. Profunda Femoral Vein: No evidence of thrombus. Normal compressibility and flow on color Doppler imaging. Femoral Vein: No evidence of thrombus. Normal compressibility, respiratory phasicity and response to augmentation. Popliteal Vein: No evidence of thrombus. Normal compressibility, respiratory phasicity and response to augmentation. Calf Veins: No evidence of thrombus. Normal compressibility and flow on color Doppler imaging. Superficial Great Saphenous Vein: No evidence of thrombus. Normal compressibility. Venous Reflux:  None. Other Findings:  None. IMPRESSION: No evidence of deep venous thrombosis in either lower extremity. Electronically Signed   By: Marijo Conception M.D.   On: 06/02/2019 10:22       Kem Parkinson, PA-C 06/02/19 1044    Wyvonnia Dusky, MD 06/03/19 1323

## 2019-06-03 ENCOUNTER — Telehealth: Payer: Self-pay | Admitting: *Deleted

## 2019-06-03 NOTE — Telephone Encounter (Signed)
Tylenol is the safest thing she can take like we have discussed before. She can also use topicals or even a pain patch over-the-counter like a lidocaine patch to the area She can be checked in the office on Wednesday or Friday of this week

## 2019-06-03 NOTE — Telephone Encounter (Signed)
Call placed to patient and patient daughter [name] made aware.   Appointment scheduled.

## 2019-06-03 NOTE — Telephone Encounter (Signed)
Received call from patient daughter, Loletha Carrow.   Reports that patient was evaluated in ER over the weekend for L sided thoracic pain. States that imaging did not show any PE on L side and no new PE on right side.   US performed on BLE and no DVT noted.   Inquired as to what she can do at home to help ease discomfort.   Also reports that patient has appointment scheduled on 06/26/2019 for CPE. Inquired as to if she needs a sooner appointment.

## 2019-06-05 ENCOUNTER — Ambulatory Visit (INDEPENDENT_AMBULATORY_CARE_PROVIDER_SITE_OTHER): Payer: Medicare Other | Admitting: Family Medicine

## 2019-06-05 ENCOUNTER — Encounter: Payer: Self-pay | Admitting: Family Medicine

## 2019-06-05 ENCOUNTER — Other Ambulatory Visit: Payer: Self-pay

## 2019-06-05 VITALS — BP 132/70 | HR 96 | Temp 98.2°F | Resp 14 | Ht 63.0 in | Wt 164.0 lb

## 2019-06-05 DIAGNOSIS — I493 Ventricular premature depolarization: Secondary | ICD-10-CM

## 2019-06-05 DIAGNOSIS — M546 Pain in thoracic spine: Secondary | ICD-10-CM | POA: Diagnosis not present

## 2019-06-05 DIAGNOSIS — I10 Essential (primary) hypertension: Secondary | ICD-10-CM | POA: Diagnosis not present

## 2019-06-05 NOTE — Progress Notes (Signed)
Subjective:    Patient ID: [name], female    DOB: 10-23-1934, 85 y.o.   MRN: [id number]  Patient presents for ER F/U (back pain )  Patient here with daughter to follow-up ER visit.  She was seen in the ER after she started having severe left midthoracic back pain on Saturday.  She denies any shortness of breath initially but when she was trying to ambulate to the car felt short winded.  She not have any chest pain.  She has been having some palpitation which she is known to have been she had taken a few extra dose of metoprolol earlier in the week which did help.  She is concerned about blood clot the way she presented in the past with her right sided pulmonary embolism.  She not had any cough or congestion no GI symptoms.  She not had any leg swelling.  She was seen in the emergency room which I reviewed in detail with patient and her daughter at the bedside.  CT scan did show pulmonary embolism on the right side along with some smaller in the same generalized area.  There was no new effusion in the chest no pneumonia.  Her EKG was unremarkable.  Her labs were at her baseline.  Hematology was consulted as she is currently not on a blood thinner in the setting of her microhemorrhages on the brain.  They recommended getting ultrasound of the lower extremities.  She did return the next morning on Sunday to have ultrasounds done there was no sign of DVT in the lower extremities the decision was made to just continue her on aspirin and that there were no new blood clots noted.  Of note nothing was found in particular on the left side to cause her discomfort.  Her pain actually improved throughout the weekend.  She started taking Tylenol also using a heating pad to the area.  By Monday she has not had any pain in her back.  She is taking all of her regular medications as prescribed otherwise.   Review Of Systems:  GEN- denies fatigue, fever, weight loss,weakness, recent illness HEENT- denies  eye drainage, change in vision, nasal discharge, CVS- denies chest pain, palpitations RESP- denies SOB, cough, wheeze ABD- denies N/V, change in stools, abd pain GU- denies dysuria, hematuria, dribbling, incontinence MSK-+ joint pain, muscle aches, injury Neuro- denies headache, dizziness, syncope, seizure activity       Objective:    BP 132/70   Pulse 96   Temp 98.2 F (36.8 C) (Temporal)   Resp 14   Ht 5\' 3"  (1.6 m)   Wt 164 lb (74.4 kg)   SpO2 96%   BMI 29.05 kg/m  GEN- NAD, alert and oriented x3 HEENT- PERRL, EOMI, non injected sclera, pink conjunctiva  CVS- RRR, no murmur RESP-CTAB ABD-NABS,soft,NT,ND MSK- Spine NT, Fair ROM, no spasm  EXT- No edema Pulses- Radial, DP- 2+        Assessment & Plan:      Problem List Items Addressed This Visit      Unprioritized   Essential hypertension    Blood pressure at goal.  Reviewed EKG no acute changes.  She has been having some PVCs which is already been worked up by cardiology.  She is taken for BX her dose of metoprolol and this has resolved the PVCs.  There was nothing new to cause her left-sided discomfort on her work-up at the ER.  Ever even notes with family.  At  this time we will remain off of any anticoagulation because of the microhemorrhages in her fall risk.  She will continue aspirin antiplatelet 325 mg once a day.  Her back pain has resolved.  I did advise that she just monitor her symptoms if she does have recurrent pain in that side to look at the scan to ensure that there is no rash suggestive of shingles.  We will see her again at the end of May for her wellness exam.      PVC (premature ventricular contraction)    Other Visit Diagnoses    Acute left-sided thoracic back pain    -  Primary   Now resolved treated as musculoskeletal pain      Note: This dictation was prepared with Dragon dictation along with smaller phrase technology. Any transcriptional errors that result from this process are  unintentional.

## 2019-06-05 NOTE — Patient Instructions (Signed)
Reschedule Physical to the end of May

## 2019-06-05 NOTE — Assessment & Plan Note (Signed)
Blood pressure at goal.  Reviewed EKG no acute changes.  She has been having some PVCs which is already been worked up by cardiology.  She is taken for BX her dose of metoprolol and this has resolved the PVCs.  There was nothing new to cause her left-sided discomfort on her work-up at the ER.  Ever even notes with family.  At this time we will remain off of any anticoagulation because of the microhemorrhages in her fall risk.  She will continue aspirin antiplatelet 325 mg once a day.  Her back pain has resolved.  I did advise that she just monitor her symptoms if she does have recurrent pain in that side to look at the scan to ensure that there is no rash suggestive of shingles.  We will see her again at the end of May for her wellness exam.

## 2019-06-11 ENCOUNTER — Encounter: Payer: Medicare Other | Admitting: Family Medicine

## 2019-06-26 ENCOUNTER — Encounter: Payer: Medicare Other | Admitting: Family Medicine

## 2019-07-11 ENCOUNTER — Other Ambulatory Visit: Payer: Self-pay | Admitting: Student

## 2019-07-11 ENCOUNTER — Other Ambulatory Visit: Payer: Self-pay | Admitting: Family Medicine

## 2019-07-18 ENCOUNTER — Emergency Department (HOSPITAL_COMMUNITY)
Admission: EM | Admit: 2019-07-18 | Discharge: 2019-07-18 | Disposition: A | Discharge status: Home / Self Care | Payer: Medicare Other | Attending: Emergency Medicine | Admitting: Emergency Medicine

## 2019-07-18 ENCOUNTER — Other Ambulatory Visit: Payer: Self-pay

## 2019-07-18 ENCOUNTER — Emergency Department (HOSPITAL_COMMUNITY): Payer: Medicare Other

## 2019-07-18 ENCOUNTER — Encounter (HOSPITAL_COMMUNITY): Payer: Self-pay | Admitting: Emergency Medicine

## 2019-07-18 DIAGNOSIS — R200 Anesthesia of skin: Secondary | ICD-10-CM

## 2019-07-18 DIAGNOSIS — Z79899 Other long term (current) drug therapy: Secondary | ICD-10-CM | POA: Diagnosis not present

## 2019-07-18 DIAGNOSIS — I10 Essential (primary) hypertension: Secondary | ICD-10-CM

## 2019-07-18 DIAGNOSIS — E039 Hypothyroidism, unspecified: Secondary | ICD-10-CM | POA: Insufficient documentation

## 2019-07-18 DIAGNOSIS — E1122 Type 2 diabetes mellitus with diabetic chronic kidney disease: Secondary | ICD-10-CM | POA: Diagnosis not present

## 2019-07-18 DIAGNOSIS — I129 Hypertensive chronic kidney disease with stage 1 through stage 4 chronic kidney disease, or unspecified chronic kidney disease: Secondary | ICD-10-CM | POA: Diagnosis not present

## 2019-07-18 DIAGNOSIS — R002 Palpitations: Secondary | ICD-10-CM | POA: Diagnosis not present

## 2019-07-18 DIAGNOSIS — R202 Paresthesia of skin: Secondary | ICD-10-CM | POA: Diagnosis not present

## 2019-07-18 DIAGNOSIS — N183 Chronic kidney disease, stage 3 unspecified: Secondary | ICD-10-CM | POA: Diagnosis not present

## 2019-07-18 DIAGNOSIS — Z7982 Long term (current) use of aspirin: Secondary | ICD-10-CM | POA: Insufficient documentation

## 2019-07-18 LAB — BASIC METABOLIC PANEL
Anion gap: 8 (ref 5–15)
BUN: 17 mg/dL (ref 8–23)
CO2: 29 mmol/L (ref 22–32)
Calcium: 9.5 mg/dL (ref 8.9–10.3)
Chloride: 102 mmol/L (ref 98–111)
Creatinine, Ser: 1.21 mg/dL — ABNORMAL HIGH (ref 0.44–1.00)
GFR calc Af Amer: 48 mL/min — ABNORMAL LOW (ref >60)
GFR calc non Af Amer: 42 mL/min — ABNORMAL LOW (ref >60)
Glucose, Bld: 98 mg/dL (ref 70–99)
Potassium: 3.9 mmol/L (ref 3.5–5.1)
Sodium: 139 mmol/L (ref 135–145)

## 2019-07-18 LAB — CBC
HCT: 43.5 % (ref 36.0–46.0)
Hemoglobin: 13.8 g/dL (ref 12.0–15.0)
MCH: 31.9 pg (ref 26.0–34.0)
MCHC: 31.7 g/dL (ref 30.0–36.0)
MCV: 100.7 fL — ABNORMAL HIGH (ref 80.0–100.0)
Platelets: 353 10*3/uL (ref 150–400)
RBC: 4.32 MIL/uL (ref 3.87–5.11)
RDW: 13.7 % (ref 11.5–15.5)
WBC: 5.2 10*3/uL (ref 4.0–10.5)
nRBC: 0 % (ref 0.0–0.2)

## 2019-07-18 NOTE — Discharge Instructions (Addendum)
Keep a daily log of your blood pressures.  Report that information to your primary care doctor.  When asked the question about taking your once a day medicine in the morning as opposed to the evening.  MRI of brain done to rule out stroke due to the numbness.  No evidence of stroke.  Return for any new or worse symptoms.  Follow-up with your primary care doctor regarding the blood pressure.

## 2019-07-18 NOTE — ED Triage Notes (Signed)
Pt c/o hypertension with heart palpitations with numbness and tingling down her left arm that started today. Denies chest pain.

## 2019-07-18 NOTE — ED Provider Notes (Signed)
[facility] EMERGENCY DEPARTMENT Provider Note   CSN: [id number] Arrival date & time: 07/18/19  1907     History Chief Complaint  Patient presents with  . Hypertension    [name] is a 85 y.o. female.  Patient with elevated blood pressures today.  Checked by the fire department and it was in the 200 range otherwise it was 0000000 systolic.  The patient also had left arm numbness no real pain.  Patient's had a past history of a head bleed.  Past history of pulmonary embolus.  No speech problems no vision problems no weakness.  No chest pain no shortness of breath.  No upper respiratory symptoms.  The numbness in the left arm went away at around 5 PM this evening.  In addition no headache.        Past Medical History:  Diagnosis Date  . Allergy   . Arthritis   . Colon cancer ([place])    colon ca dx 07/30/09  . History of cardiac monitoring 07/2017   "Event monitor demonstrated sinus rhythm with isolated PACs and no arrhythmias"  . History of colon cancer 06/2009   found at time of TCS 06/29/09, 1.2cm sessile cecal polyp, no adjuvent therapy needed  . HTN (hypertension)   . Hx of cardiovascular stress test 07/2017   "No diagnostic ST segment changes to indicate ischemia. Small, moderate intensity, reversible apical to basal inferolateral defect consistent with ischemia. This is a low risk study. Nuclear stress EF: 84%."  . Hyperlipidemia   . Hypothyroidism   . PE (pulmonary thromboembolism) ([place])   . Renal disorder    cyst on kidney   . Stroke ([place])   . Vertigo     Patient Active Problem List   Diagnosis Date Noted  . Diabetes mellitus without complication ([place]) [id number]  . Complex partial seizure ([place]) 03/02/2019  . Left hemiparesis ([place]) 03/01/2019  . Sensory disturbance 03/01/2019  . TIA (transient ischemic attack) 11/25/2018  . Silent micro-hemorrhage of brain ([place]) 11/25/2018  . PVC (premature ventricular contraction) 11/16/2018  . DDD (degenerative disc  disease), cervical 08/16/2018  . Hyperlipemia 08/16/2018  . History of pulmonary embolism 04/26/2018  . Insomnia 12/15/2015  . Paresthesia of left upper and lower extremity 11/21/2015  . Left leg weakness 11/21/2015  . Paresthesias   . Essential hypertension 11/02/2015  . Hypothyroidism 11/02/2015  . Vertigo 11/02/2015  . CKD (chronic kidney disease), stage III ([place]) 11/02/2015  . Loss of weight 11/02/2015  . OA (osteoarthritis) of knee 11/02/2015  . Colon cancer ([place]) 02/28/2011  . History of colon cancer 06/29/2010  . Constipation 06/10/2009    Past Surgical History:  Procedure Laterality Date  . ABDOMINAL HYSTERECTOMY    . COLON SURGERY  07/2009   right hemicolectomy, no residual colon cancer on path  . COLONOSCOPY  07/16/2010   [id number] POLYP-TCS 3 YEARS  . COLONOSCOPY N/A 08/02/2013   hyperplastic polyps, surveillance in 2020 if benefits outweight the risks  . COLONOSCOPY  06/2009   1.2 cm sessile cecal polyp which had adenocarcinoma arising in a tubular adenoma.  Marland Kitchen PARTIAL THYMECTOMY    . partial thyroidectomy     benign tumors     OB History    Gravida  3   Para  2   Term  1   Preterm  1   AB  1   Living  2     SAB  1   TAB      Ectopic  Multiple      Live Births              Family History  Problem Relation Age of Onset  . Colon cancer Mother        >age60  . Arthritis Mother   . Cancer Mother   . Heart disease Mother   . Hyperlipidemia Mother   . Hypertension Mother   . Heart attack Father   . Heart disease Father   . Diabetes Maternal Aunt   . Hyperlipidemia Daughter   . Hypertension Daughter   . Liver disease Neg Hx     Social History   Tobacco Use  . Smoking status: Never Smoker  . Smokeless tobacco: Never Used  Substance Use Topics  . Alcohol use: No    Alcohol/week: 0.0 standard drinks  . Drug use: No    Home Medications Prior to Admission medications   Medication Sig Start Date End Date Taking?  Authorizing Provider  acetaminophen (TYLENOL) 500 MG tablet Take 1,000 mg by mouth every 8 (eight) hours as needed for mild pain or headache.    Yes [provider]  amLODipine (NORVASC) 5 MG tablet Take 1 tablet (5 mg total) by mouth daily. Patient taking differently: Take 5 mg by mouth every evening.  12/05/18  Yes [place], Modena Nunnery, MD  ASPERCREME LIDOCAINE EX Apply 1 application topically daily as needed (for knee pain).    Yes [provider]  aspirin 325 MG EC tablet Take 162.5-325 mg by mouth daily.   Yes [provider]  atorvastatin (LIPITOR) 40 MG tablet TAKE 2 TABLETS BY MOUTH ONCE A DAY. Patient taking differently: Take 80 mg by mouth every evening.  05/20/19  Yes [place], Modena Nunnery, MD  cholecalciferol (VITAMIN D3) 25 MCG (1000 UT) tablet Take 1,000 Units by mouth daily.    Yes [provider]  gabapentin (NEURONTIN) 300 MG capsule Take 1 in the morning and 1 in the evening Patient taking differently: Take 300 mg by mouth 2 (two) times daily. Take 1 in the morning and 1 in the evening 10/26/18  Yes [place], Modena Nunnery, MD  levETIRAcetam (KEPPRA) 250 MG tablet Take 1 tablet (250 mg total) by mouth 2 (two) times daily. Patient taking differently: Take 125 mg by mouth 2 (two) times daily. Patient can only tolerate 1/2 tab 03/02/19  Yes Tat, David, MD  LINZESS 72 MCG capsule TAKE 1 CAPSULE BY MOUTH DAILY BEFORE BREAKFAST. Patient taking differently: Take 72 mcg by mouth daily as needed (for constipation).  07/11/19  Yes [place], Modena Nunnery, MD  loratadine (CLARITIN) 10 MG tablet Take 10 mg by mouth daily.   Yes [provider]  metoprolol tartrate (LOPRESSOR) 25 MG tablet TAKE 1&1/2 TABLET BY MOUTH TWICE A DAY. Patient taking differently: Take 37.5 mg by mouth 2 (two) times daily.  07/11/19  Yes Herminio Commons, MD  RESTASIS 0.05 % ophthalmic emulsion Place 1 drop into both eyes 2 (two) times daily as needed (chronic dry eye).  05/17/10  Yes [provider]  traMADol (ULTRAM) 50 MG tablet Take 25 mg by mouth every 8 (eight) hours as needed for moderate pain.  01/10/19  Yes [provider]    Allergies    Patient has no known allergies.  Review of Systems   Review of Systems  Constitutional: Negative for chills and fever.  HENT: Negative for congestion, rhinorrhea and sore throat.   Eyes: Negative for visual disturbance.  Respiratory: Negative for cough  and shortness of breath.   Cardiovascular: Negative for chest pain and leg swelling.  Gastrointestinal: Negative for abdominal pain, diarrhea, nausea and vomiting.  Genitourinary: Negative for dysuria.  Musculoskeletal: Negative for back pain and neck pain.  Skin: Negative for rash.  Neurological: Positive for numbness. Negative for dizziness, weakness, light-headedness and headaches.  Hematological: Does not bruise/bleed easily.  Psychiatric/Behavioral: Negative for confusion.    Physical Exam Updated Vital Signs BP (!) 165/92 (BP Location: Right Arm)   Pulse 72   Temp 98.1 F (36.7 C) (Oral)   Resp 17   Ht 1.626 m (5\' 4" )   Wt 73.9 kg   SpO2 97%   BMI 27.98 kg/m   Physical Exam Vitals and nursing note reviewed.  Constitutional:      General: She is not in acute distress.    Appearance: Normal appearance. She is well-developed.  HENT:     Head: Normocephalic and atraumatic.  Eyes:     Extraocular Movements: Extraocular movements intact.     Conjunctiva/sclera: Conjunctivae normal.     Pupils: Pupils are equal, round, and reactive to light.  Cardiovascular:     Rate and Rhythm: Normal rate and regular rhythm.     Heart sounds: No murmur.  Pulmonary:     Effort: Pulmonary effort is normal. No respiratory distress.     Breath sounds: Normal breath sounds.  Abdominal:     Palpations: Abdomen is soft.     Tenderness: There is no abdominal tenderness.  Musculoskeletal:        General: Normal range of motion.     Cervical back: Normal range of  motion and neck supple.  Skin:    General: Skin is warm and dry.     Capillary Refill: Capillary refill takes less than 2 seconds.  Neurological:     General: No focal deficit present.     Mental Status: She is alert and oriented to person, place, and time.     Cranial Nerves: No cranial nerve deficit.     Sensory: No sensory deficit.     Motor: No weakness.     ED Results / Procedures / Treatments   Labs (all labs ordered are listed, but only abnormal results are displayed) Labs Reviewed  BASIC METABOLIC PANEL - Abnormal; Notable for the following components:      Result Value   Creatinine, Ser 1.21 (*)    GFR calc non Af Amer 42 (*)    GFR calc Af Amer 48 (*)    All other components within normal limits  CBC - Abnormal; Notable for the following components:   MCV 100.7 (*)    All other components within normal limits    EKG EKG Interpretation  Date/Time:  Thursday Jul 18 2019 19:28:09 EDT Ventricular Rate:  70 PR Interval:    QRS Duration: 82 QT Interval:  411 QTC Calculation: 444 R Axis:   -38 Text Interpretation: Sinus rhythm Consider left atrial enlargement Inferior infarct, old Baseline wander in lead(s) V2 No significant change since last tracing Confirmed by Fredia Sorrow [phone]) on 07/18/2019 7:39:39 PM   Radiology MR Brain Wo Contrast (neuro protocol)  Result Date: 07/18/2019 CLINICAL DATA:  Initial evaluation for acute numbness and tingling involving the left upper extremity. EXAM: MRI HEAD WITHOUT CONTRAST TECHNIQUE: Multiplanar, multiecho pulse sequences of the brain and surrounding structures were obtained without intravenous contrast. COMPARISON:  Comparison made with prior MRI from 03/01/2019. FINDINGS: Brain: Moderately advanced age-related cerebral atrophy. Advanced patchy and confluent  T2/FLAIR hyperintensity involving the periventricular and deep white matter both cerebral hemispheres, with pronounced involvement of the deep gray nuclei and pons,  consistent with advanced chronic microvascular ischemic disease. Few scattered superimposed chronic micro hemorrhages noted, predominantly localized around the thalami, with a few additional foci in noted within the cerebrum and cerebellum, most like related to the underlying chronic hypertension. No abnormal foci of restricted diffusion to suggest acute or subacute ischemia. Gray-white matter differentiation maintained. No areas of remote cortical infarction. No evidence for acute intracranial hemorrhage. No mass lesion, midline shift or mass effect. Diffuse ventricular prominence related to global parenchymal volume loss without hydrocephalus. No extra-axial fluid collection. Pituitary gland mildly prominent without discrete mass, stable. Midline structures intact. Vascular: Major intracranial vascular flow voids are maintained. Skull and upper cervical spine: Craniocervical junction within normal limits. Multilevel degenerative spondylosis noted within the upper cervical spine without high-grade stenosis. Bone marrow signal intensity within normal limits. No scalp soft tissue abnormality. Sinuses/Orbits: Globes and orbital soft tissues within normal limits. Paranasal sinuses are largely clear. No mastoid effusion. Inner ear structures grossly normal. Other: None. IMPRESSION: 1. No acute intracranial infarct or other abnormality. 2. Moderately advanced cerebral atrophy with severe chronic small vessel ischemic disease, stable from previous. Electronically Signed   By: Jeannine Boga M.D.   On: 07/18/2019 20:53   DG Chest Port 1 View  Result Date: 07/18/2019 CLINICAL DATA:  Hypertension palpitation EXAM: PORTABLE CHEST 1 VIEW COMPARISON:  06/01/2019 FINDINGS: The heart size and mediastinal contours are within normal limits. Both lungs are clear. The visualized skeletal structures are unremarkable. IMPRESSION: No active disease. Electronically Signed   By: Donavan Foil M.D.   On: 07/18/2019 20:06     Procedures Procedures (including critical care time)  Medications Ordered in ED Medications - No data to display  ED Course  I have reviewed the triage vital signs and the nursing notes.  Pertinent labs & imaging results that were available during my care of the patient were reviewed by me and considered in my medical decision making (see chart for details).    MDM Rules/Calculators/A&P                      EKG without any significant changes.  Chest x-ray negative.  MRI brain done due to the numbness to rule out stroke.  That was negative any acute event.  Labs without significant abnormalities.  Some renal insufficiency but baseline.  We will have patient keep a daily log of blood pressure may require some adjustments.  Blood pressure here about 0000000 systolic.  Patient does take her blood pressure medicine one of them twice a day the other is once a day which she takes a once a day in the evening.  Would consider may be talking to her primary care doctor about taking that in the morning.  Patient will keep her daily log and follow-up with her primary care provider.  She will return for any new or worse symptoms.  Patient currently asymptomatic.   Final Clinical Impression(s) / ED Diagnoses Final diagnoses:  Essential hypertension  Numbness and tingling of left upper extremity    Rx / DC Orders ED Discharge Orders    None       Fredia Sorrow, MD 07/18/19 2154

## 2019-07-22 ENCOUNTER — Telehealth: Payer: Self-pay | Admitting: Family Medicine

## 2019-07-22 NOTE — Telephone Encounter (Signed)
Called patient to follow up with her from her recent ER visit on 07/18/2019. Patient was seen for elevated BP. Spoke with patient and she states that she is back at her baseline and her BP has been controlled. Advised that follow up was recommended with Dr. Buelah Manis. Patient verbalized understanding and is scheduled for this Wednesday 07/24/2019 at 3:30 PM

## 2019-07-24 ENCOUNTER — Encounter: Payer: Self-pay | Admitting: Family Medicine

## 2019-07-24 ENCOUNTER — Ambulatory Visit (INDEPENDENT_AMBULATORY_CARE_PROVIDER_SITE_OTHER): Payer: Medicare Other | Admitting: Family Medicine

## 2019-07-24 ENCOUNTER — Other Ambulatory Visit: Payer: Self-pay

## 2019-07-24 VITALS — BP 146/78 | HR 70 | Temp 98.1°F | Resp 14 | Ht 63.0 in | Wt 164.0 lb

## 2019-07-24 DIAGNOSIS — M2041 Other hammer toe(s) (acquired), right foot: Secondary | ICD-10-CM

## 2019-07-24 DIAGNOSIS — L84 Corns and callosities: Secondary | ICD-10-CM

## 2019-07-24 DIAGNOSIS — M2042 Other hammer toe(s) (acquired), left foot: Secondary | ICD-10-CM | POA: Diagnosis not present

## 2019-07-24 DIAGNOSIS — I10 Essential (primary) hypertension: Secondary | ICD-10-CM | POA: Diagnosis not present

## 2019-07-24 DIAGNOSIS — I493 Ventricular premature depolarization: Secondary | ICD-10-CM

## 2019-07-24 NOTE — Patient Instructions (Addendum)
Send me blood pressure readings in 1 week  F/U as previous

## 2019-07-24 NOTE — Progress Notes (Signed)
Subjective:    Patient ID: [name], female    DOB: 1934/04/29, 85 y.o.   MRN: [id number]  Patient presents for ER F/U (was seen in ER for HTN)  Patient here for ER follow-up.  She was seen in the ER last week  secondary to elevated blood pressure.  She was checked by the fire department and her systolic was up to [id number] and then came down to the 165/92 by the time she got to the emergency room.  She did not have any chest pain or shortness of breath.  SHe did have some numbness in her left arm which was concerning for stroke.  She has had the symptoms first feeding her blood pressure going on but I thought it was just her regular neck pain. EKG was performed chest x-ray MRI of brain to rule out stroke all were unremarkable.  Labs showed chronic renal insufficiency creatinine of 1.21  ER note reviewed in detail  Also concerned about right toe pain.  A week or so ago she stumped her right foot, initially Had pain and swelling across the middle that has resolved.  t she does have a large corn which she has been using a pad  On .  Also has hammertoes on both feet  Review Of Systems:  GEN- denies fatigue, fever, weight loss,weakness, recent illness HEENT- denies eye drainage, change in vision, nasal discharge, CVS- denies chest pain, palpitations RESP- denies SOB, cough, wheeze ABD- denies N/V, change in stools, abd pain GU- denies dysuria, hematuria, dribbling, incontinence MSK-+ joint pain, muscle aches, injury Neuro- denies headache, dizziness, syncope, seizure activity       Objective:    BP (!) 146/78   Pulse 70   Temp 98.1 F (36.7 C) (Temporal)   Resp 14   Ht 5\' 3"  (1.6 m)   Wt 164 lb (74.4 kg)   SpO2 97%   BMI 29.05 kg/m  GEN- NAD, alert and oriented x3 HEENT- PERRL, EOMI, non injected sclera, pink conjunctiva, MMM, oropharynx clear Neck- Supple, no thyromegaly, fair ROM  CVS- RRR, no murmur RESP-CTAB ABD-NABS,soft,NT,ND NEURO-CNII-XII in tact no focal  deficits EXT- No edema , Right foot, moderate size corn on 5th digit, mild TTP , no erythema, FROM toes, ankle, hammer toes with thickened nails bilat  Pulses- Radial, DP- 2+  Manual blood pressure cuff on right side 138/68 left side 140/74  Patient's machine was approximately 10 points higher though right side was lower than left      Assessment & Plan:      Problem List Items Addressed This Visit      Unprioritized   Essential hypertension - Primary    Blood pressure looks okay for her age.  At home it has ranged anywhere from 120s to 140s over 60s to 70s.  They were considering if we should move her amlodipine to the morning to see if that made a difference with her blood pressure.  I am willing to try this.  She will continue her metoprolol as currently prescribed as her heart rate is already in the 60s I do not want to push this dose.  We will move amlodipine to the morning.  Daughter will continue to monitor her blood pressure even though her machine is off some.  She has a follow-up with cardiology in 2 weeks.  Her labs were at baseline.  No new focal deficit and no stroke noted on MRI.  Blood pressure does remain above 140s over 90s  and I will increase her amlodipine to 7.5 mg once a day   Regarding the corns and the hammertoes.  I recommend they use topical corn callus medicated pads.  Also recommend that daughter down some of the nails on the hammertoes in order to prevent ulcerations and rubbing between the toes. No maging needed at this time      PVC (premature ventricular contraction)    States she has experienced a few palpitations but nothing has been sustained.  She did not have any chest pain on the day of the event.  Continue on beta-blocker per above       Other Visit Diagnoses    Corn or callus       Hammer toes of both feet          Note: This dictation was prepared with Dragon dictation along with smaller phrase technology. Any transcriptional errors that  result from this process are unintentional.

## 2019-07-24 NOTE — Assessment & Plan Note (Signed)
States she has experienced a few palpitations but nothing has been sustained.  She did not have any chest pain on the day of the event.  Continue on beta-blocker per above

## 2019-07-24 NOTE — Assessment & Plan Note (Addendum)
Blood pressure looks okay for her age.  At home it has ranged anywhere from 120s to 140s over 60s to 70s.  They were considering if we should move her amlodipine to the morning to see if that made a difference with her blood pressure.  I am willing to try this.  She will continue her metoprolol as currently prescribed as her heart rate is already in the 60s I do not want to push this dose.  We will move amlodipine to the morning.  Daughter will continue to monitor her blood pressure even though her machine is off some.  She has a follow-up with cardiology in 2 weeks.  Her labs were at baseline.  No new focal deficit and no stroke noted on MRI.  Blood pressure does remain above 140s over 90s and I will increase her amlodipine to 7.5 mg once a day   Regarding the corns and the hammertoes.  I recommend they use topical corn callus medicated pads.  Also recommend that daughter down some of the nails on the hammertoes in order to prevent ulcerations and rubbing between the toes. No maging needed at this time

## 2019-08-04 ENCOUNTER — Encounter: Payer: Self-pay | Admitting: Family Medicine

## 2019-08-06 ENCOUNTER — Ambulatory Visit (INDEPENDENT_AMBULATORY_CARE_PROVIDER_SITE_OTHER): Payer: Medicare Other

## 2019-08-06 ENCOUNTER — Encounter: Payer: Self-pay | Admitting: Cardiovascular Disease

## 2019-08-06 ENCOUNTER — Ambulatory Visit: Payer: Medicare Other | Admitting: Cardiovascular Disease

## 2019-08-06 ENCOUNTER — Other Ambulatory Visit: Payer: Self-pay

## 2019-08-06 VITALS — BP 142/68 | HR 70 | Ht 64.0 in | Wt 163.8 lb

## 2019-08-06 DIAGNOSIS — R002 Palpitations: Secondary | ICD-10-CM

## 2019-08-06 DIAGNOSIS — I10 Essential (primary) hypertension: Secondary | ICD-10-CM

## 2019-08-06 DIAGNOSIS — E785 Hyperlipidemia, unspecified: Secondary | ICD-10-CM

## 2019-08-06 NOTE — Progress Notes (Signed)
SUBJECTIVE: [name] is a 85 y.o. female with past medical history of HTN, hypothyroidism, pulmonary embolism, palpitations (PAC's by prior monitor in 07/2017), and prior CVA.  She was evaluated in the ED earlier this month for a reported systolic blood pressure of over 200 when checked at the fire department.  MRI showed no evidence of stroke.  She followed up with her PCP afterwards and amlodipine was moved to the morning.  She is here with her daughter.  She has been experiencing palpitations which last less than a minute every evening close to 7 PM.  She may have sporadic palpitations throughout the day but they are not sustained.  She denies exertional chest pain.  She has intermittent shortness of breath but denies orthopnea and paroxysmal nocturnal dyspnea.  She has had some right foot and ankle swelling.  She underwent Dopplers on 06/02/2019 which were negative for DVT.  I personally reviewed the ECG performed on 07/18/2019 which demonstrated sinus rhythm with nonspecific T wave abnormalities.  Potassium was 3.9 on 07/18/2019.  Review of Systems: As per "subjective", otherwise negative.  No Known Allergies  Current Outpatient Medications  Medication Sig Dispense Refill  . acetaminophen (TYLENOL) 500 MG tablet Take 1,000 mg by mouth every 8 (eight) hours as needed for mild pain or headache.     Marland Kitchen amLODipine (NORVASC) 5 MG tablet Take 1 tablet (5 mg total) by mouth daily. (Patient taking differently: Take 5 mg by mouth every evening. ) 30 tablet 3  . ASPERCREME LIDOCAINE EX Apply 1 application topically daily as needed (for knee pain).     Marland Kitchen aspirin 325 MG EC tablet Take 162.5-325 mg by mouth daily.    Marland Kitchen atorvastatin (LIPITOR) 40 MG tablet TAKE 2 TABLETS BY MOUTH ONCE A DAY. (Patient taking differently: Take 80 mg by mouth every evening. ) 180 tablet 2  . cholecalciferol (VITAMIN D3) 25 MCG (1000 UT) tablet Take 1,000 Units by mouth daily.     Marland Kitchen gabapentin  (NEURONTIN) 300 MG capsule Take 1 in the morning and 1 in the evening (Patient taking differently: Take 300 mg by mouth 2 (two) times daily. Take 1 in the morning and 1 in the evening)    . levETIRAcetam (KEPPRA) 250 MG tablet Take 1 tablet (250 mg total) by mouth 2 (two) times daily. (Patient taking differently: Take 125 mg by mouth 2 (two) times daily. Patient can only tolerate 1/2 tab) 60 tablet 1  . LINZESS 72 MCG capsule TAKE 1 CAPSULE BY MOUTH DAILY BEFORE BREAKFAST. (Patient taking differently: Take 72 mcg by mouth daily as needed (for constipation). ) 30 capsule 0  . loratadine (CLARITIN) 10 MG tablet Take 10 mg by mouth daily.    . metoprolol tartrate (LOPRESSOR) 25 MG tablet TAKE 1&1/2 TABLET BY MOUTH TWICE A DAY. (Patient taking differently: Take 37.5 mg by mouth 2 (two) times daily. ) 270 tablet 1  . RESTASIS 0.05 % ophthalmic emulsion Place 1 drop into both eyes 2 (two) times daily as needed (chronic dry eye).     . traMADol (ULTRAM) 50 MG tablet Take 25 mg by mouth every 8 (eight) hours as needed for moderate pain.      No current facility-administered medications for this visit.    Past Medical History:  Diagnosis Date  . Allergy   . Arthritis   . Colon cancer ([place])    colon ca dx 07/30/09  . History of cardiac monitoring 07/2017   "Event monitor  demonstrated sinus rhythm with isolated PACs and no arrhythmias"  . History of colon cancer 06/2009   found at time of TCS 06/29/09, 1.2cm sessile cecal polyp, no adjuvent therapy needed  . HTN (hypertension)   . Hx of cardiovascular stress test 07/2017   "No diagnostic ST segment changes to indicate ischemia. Small, moderate intensity, reversible apical to basal inferolateral defect consistent with ischemia. This is a low risk study. Nuclear stress EF: 84%."  . Hyperlipidemia   . Hypothyroidism   . PE (pulmonary thromboembolism) ([place])   . Renal disorder    cyst on kidney   . Stroke ([place])   . Vertigo     Past Surgical History:    Procedure Laterality Date  . ABDOMINAL HYSTERECTOMY    . COLON SURGERY  07/2009   right hemicolectomy, no residual colon cancer on path  . COLONOSCOPY  07/16/2010   [id number] POLYP-TCS 3 YEARS  . COLONOSCOPY N/A 08/02/2013   hyperplastic polyps, surveillance in 2020 if benefits outweight the risks  . COLONOSCOPY  06/2009   1.2 cm sessile cecal polyp which had adenocarcinoma arising in a tubular adenoma.  Marland Kitchen PARTIAL THYMECTOMY    . partial thyroidectomy     benign tumors    Social History   Socioeconomic History  . Marital status: Widowed    Spouse name: Not on file  . Number of children: 2  . Years of education: Not on file  . Highest education level: Not on file  Occupational History  . Occupation: Psychologist, occupational at [employer]  . Occupation: retired from [employer]  Tobacco Use  . Smoking status: Never Smoker  . Smokeless tobacco: Never Used  Substance and Sexual Activity  . Alcohol use: No    Alcohol/week: 0.0 standard drinks  . Drug use: No  . Sexual activity: Not Currently  Other Topics Concern  . Not on file  Social History Narrative  . Not on file   Social Determinants of Health   Financial Resource Strain:   . Difficulty of Paying Living Expenses:   Food Insecurity:   . Worried About Charity fundraiser in the Last Year:   . Arboriculturist in the Last Year:   Transportation Needs:   . Film/video editor (Medical):   Marland Kitchen Lack of Transportation (Non-Medical):   Physical Activity:   . Days of Exercise per Week:   . Minutes of Exercise per Session:   Stress:   . Feeling of Stress :   Social Connections:   . Frequency of Communication with Friends and Family:   . Frequency of Social Gatherings with Friends and Family:   . Attends Religious Services:   . Active Member of Clubs or Organizations:   . Attends Archivist Meetings:   Marland Kitchen Marital Status:   Intimate Partner Violence:   . Fear of Current or Ex-Partner:   . Emotionally  Abused:   Marland Kitchen Physically Abused:   . Sexually Abused:     Barbarann Ehlers, RN was present throughout the entirety of the encounter.  Vitals:   08/06/19 1623  BP: (!) 142/68  Pulse: 70  SpO2: 98%  Weight: 163 lb 12.8 oz (74.3 kg)  Height: 5\' 4"  (1.626 m)    Wt Readings from Last 3 Encounters:  08/06/19 163 lb 12.8 oz (74.3 kg)  07/24/19 164 lb (74.4 kg)  07/18/19 163 lb (73.9 kg)     PHYSICAL EXAM General: NAD HEENT: Normal. Neck: No JVD, no  thyromegaly. Lungs: Clear to auscultation bilaterally with normal respiratory effort. CV: Regular rate and rhythm, normal S1/S2, no S3/S4, no murmur. No pretibial or periankle edema.  No carotid bruit.   Abdomen: Soft, nontender, no distention.  Neurologic: Alert and oriented.  Psych: Normal affect. Skin: Normal. Musculoskeletal: No gross deformities.      Labs: Lab Results  Component Value Date/Time   K 3.9 07/18/2019 08:45 PM   K 3.9 04/16/2015 08:12 AM   BUN 17 07/18/2019 08:45 PM   BUN 12.4 04/16/2015 08:12 AM   CREATININE 1.21 (H) 07/18/2019 08:45 PM   CREATININE 1.50 (H) 12/05/2018 03:45 PM   CREATININE 1.2 (H) 04/16/2015 08:12 AM   ALT 32 04/16/2019 08:33 AM   ALT 19 04/16/2015 08:12 AM   TSH 0.37 (L) 10/26/2018 11:53 AM   HGB 13.8 07/18/2019 08:45 PM   HGB 14.7 04/16/2015 08:12 AM     Lipids: Lab Results  Component Value Date/Time   LDLCALC 81 03/01/2019 06:29 AM   LDLCALC 67 02/28/2018 09:24 AM   CHOL 143 03/01/2019 06:29 AM   TRIG 42 03/01/2019 06:29 AM   HDL 54 03/01/2019 06:29 AM      Echocardiogram: 03/01/2019   1. Left ventricular ejection fraction, by visual estimation, is 70 to  75%. The left ventricle has hyperdynamic function. There is mildly  increased left ventricular hypertrophy.  2. Left ventricular diastolic parameters are consistent with Grade I  diastolic dysfunction (impaired relaxation).  3. The left ventricle has no regional wall motion abnormalities.  4. Global right  ventricle has normal systolic function.The right  ventricular size is normal. No increase in right ventricular wall  thickness.  5. Left atrial size was normal.  6. Right atrial size was normal.  7. Mild mitral annular calcification.  8. The mitral valve is abnormal. Systolic bowing with possible subleaflet  prolapse involving posterior leaflet. Trivial mitral valve regurgitation.  9. The tricuspid valve is grossly normal. Tricuspid valve regurgitation  is mild.  10. The aortic valve is grossly normal. Aortic valve regurgitation is not  visualized.  11. The pulmonic valve was grossly normal. Pulmonic valve regurgitation is  trivial.  12. Moderately elevated pulmonary artery systolic pressure.  13. The tricuspid regurgitant velocity is 2.91 m/s, and with an assumed  right atrial pressure of 10 mmHg, the estimated right ventricular systolic  pressure is moderately elevated at 43.9 mmHg.    Cardiac Event Monitor: 07/2017  Sinus rhythm with isolated PACs. No arrhythmias.  Carotid Dopplers: 08/20/2018 IMPRESSION: 1. Mild bilateral carotid bifurcation plaque resulting in less than 50% diameter stenosis. 2. Antegrade bilateral vertebral arterial flow.   ASSESSMENT AND PLAN:  1.  Palpitations: Prior event monitoring showed PACs with no significant arrhythmias.  Lopressor increased to 37.5 mg twice daily on 11/13/2018.  She has been having increased palpitations.  Potassium was normal on 07/18/2019.  I will obtain a 2-week event monitor.  Most recent echocardiogram performed in December 2020 demonstrated normal LV systolic function.  2.  Hypertension: BP only mildly elevated today.  I do not recommend any adjustments today as amlodipine was recently shifted to the morning by her PCP.  The patient and her daughter confirm that her blood pressures have been much better controlled at home.  3.  Hyperlipidemia: Lipids reviewed above.  Continue atorvastatin 80 mg.   Disposition: Follow  up 3 months virtual visit   Kate Sable, M.D., F.A.C.C.

## 2019-08-06 NOTE — Patient Instructions (Signed)
Medication Instructions:  Your physician recommends that you continue on your current medications as directed. Please refer to the Current Medication list given to you today.  *If you need a refill on your cardiac medications before your next appointment, please call your pharmacy*   Lab Work: None today If you have labs (blood work) drawn today and your tests are completely normal, you will receive your results only by: Marland Kitchen MyChart Message (if you have MyChart) OR . A paper copy in the mail If you have any lab test that is abnormal or we need to change your treatment, we will call you to review the results.   Testing/Procedures: Your physician has recommended that you wear an event monitor. Event monitors are medical devices that record the heart's electrical activity. Doctors most often [place] these monitors to diagnose arrhythmias. Arrhythmias are problems with the speed or rhythm of the heartbeat. The monitor is a small, portable device. You can wear one while you do your normal daily activities. This is usually used to diagnose what is causing palpitations/syncope (passing out).     Follow-Up: At [facility], you and your health needs are our priority.  As part of our continuing mission to provide you with exceptional heart care, we have created designated Provider Care Teams.  These Care Teams include your primary Cardiologist (physician) and Advanced Practice Providers (APPs -  Physician Assistants and Nurse Practitioners) who all work together to provide you with the care you need, when you need it.  We recommend signing up for the patient portal called "MyChart".  Sign up information is provided on this After Visit Summary.  MyChart is used to connect with patients for Virtual Visits (Telemedicine).  Patients are able to view lab/test results, encounter notes, upcoming appointments, etc.  Non-urgent messages can be sent to your provider as well.   To learn more about what you can do  with MyChart, go to [email or web address].    Your next appointment:   3 month(s)  The format for your next appointment:   In Person  Provider:   Kate Sable, MD   Other Instructions None     Thank you for choosing [facility] !

## 2019-08-14 ENCOUNTER — Other Ambulatory Visit: Payer: Self-pay

## 2019-08-14 ENCOUNTER — Encounter: Payer: Self-pay | Admitting: Family Medicine

## 2019-08-14 ENCOUNTER — Ambulatory Visit (INDEPENDENT_AMBULATORY_CARE_PROVIDER_SITE_OTHER): Payer: Medicare Other | Admitting: Family Medicine

## 2019-08-14 VITALS — BP 124/68 | HR 70 | Temp 97.9°F | Resp 14 | Ht 64.0 in | Wt 163.0 lb

## 2019-08-14 DIAGNOSIS — H6123 Impacted cerumen, bilateral: Secondary | ICD-10-CM

## 2019-08-14 MED ORDER — DEBROX 6.5 % OT SOLN
5.0000 [drp] | Freq: Two times a day (BID) | OTIC | 0 refills | Status: DC
Start: 2019-08-14 — End: 2019-09-20

## 2019-08-14 NOTE — Patient Instructions (Addendum)
F/U as previous  Use the debrox drops as prescribed

## 2019-08-14 NOTE — Progress Notes (Signed)
   Subjective:    Patient ID: [name], female    DOB: 1934-04-03, 85 y.o.   MRN: [id number]  Patient presents for Ear Issues (x2 weeks- L ear- feels like it's blocked) Since our last visit she had another episode where BP went up to  150's and she felt fatigued. BP came down fairly quickly and She was wearing heart monitor that cardiology recently placed Otherwise bp has been good  Today feels like ears are clogged up, she cant hear out of left ear. Unable to get to her ENT today     Review Of Systems:  GEN- denies fatigue, fever, weight loss,weakness, recent illness HEENT- denies eye drainage, change in vision, nasal discharge, CVS- denies chest pain, palpitations RESP- denies SOB, cough, wheeze Neuro- denies headache, dizziness, syncope, seizure activity       Objective:    BP 124/68   Pulse 70   Temp 97.9 F (36.6 C) (Temporal)   Resp 14   Ht 5\' 4"  (1.626 m)   Wt 163 lb (73.9 kg)   SpO2 96%   BMI 27.98 kg/m  GEN- NAD, alert and oriented x3 HEENT- PERRL, EOMI, non injected sclera, pink conjunctiva, MMM, oropharynx clear , Ear canals impacted bilat with cerumen  With irrigation still has horrible wax with the back canal left TM intact canal clear Neck- Supple, no thyromegaly CVS- RRR, no murmur , heart monitor in place  RESP-CTAB EXT- No edema Pulses- Radial 2+        Assessment & Plan:      Problem List Items Addressed This Visit    None    Visit Diagnoses    Bilateral impacted cerumen    -  Primary   s/p irrigation at bedside, no infection noted in left ear, TM in tact, RIght wax still impacted - hard wax and uncomfortable so nurse stopped irrigating, Debrox sent.If wax does not come out on its own, pt will schedule with her ENT      Note: This dictation was prepared with Dragon dictation along with smaller phrase technology. Any transcriptional errors that result from this process are unintentional.

## 2019-08-21 ENCOUNTER — Encounter: Payer: Medicare Other | Admitting: Family Medicine

## 2019-09-07 DIAGNOSIS — R002 Palpitations: Secondary | ICD-10-CM | POA: Diagnosis not present

## 2019-09-07 IMAGING — CT CT CERVICAL SPINE WITHOUT CONTRAST
3 of 4 series · 13 of 33 positions shown, 16 images · non-contrast
Comparison: 11/18/2017

CLINICAL DATA: Woke up this morning with left-sided neck pain.
Negative trauma history

EXAM:
CT CERVICAL SPINE WITHOUT CONTRAST
TECHNIQUE: Multidetector CT imaging of the cervical spine was performed without
intravenous contrast. Multiplanar CT image reconstructions were also
generated.

[Series 5: sag bone · sagittal · 0.32mm/px · 5 of 55 slices shown, 6 images]
[im 19/55  bone]
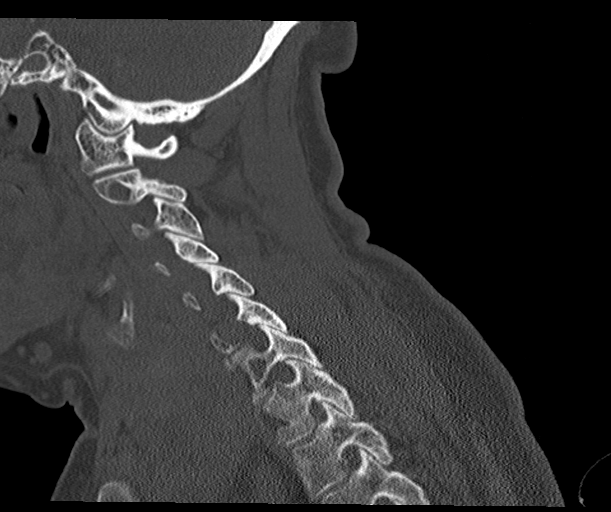
[im 23/55  bone]
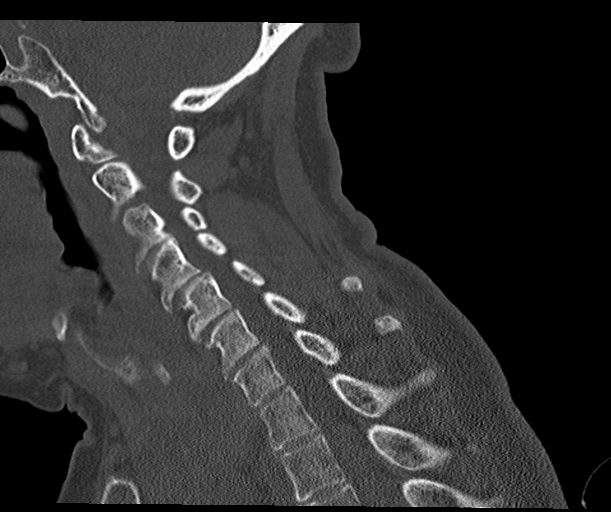
[im 28/55  soft-tissue]
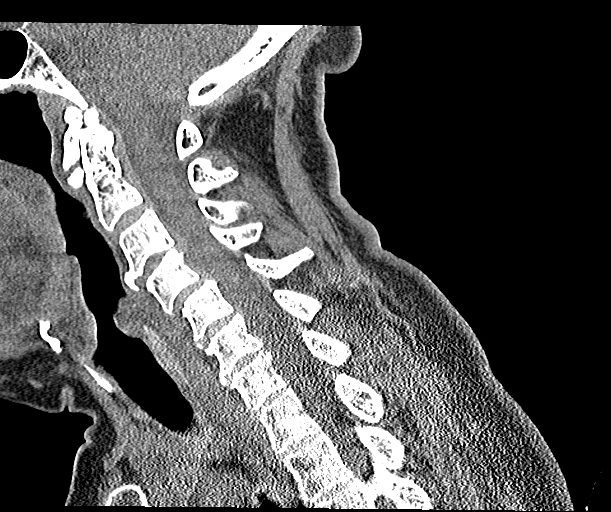
[im 28/55  bone]
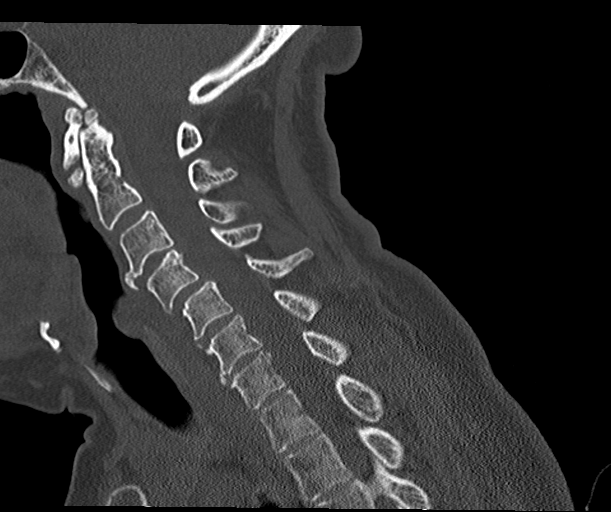
[im 32/55  bone]
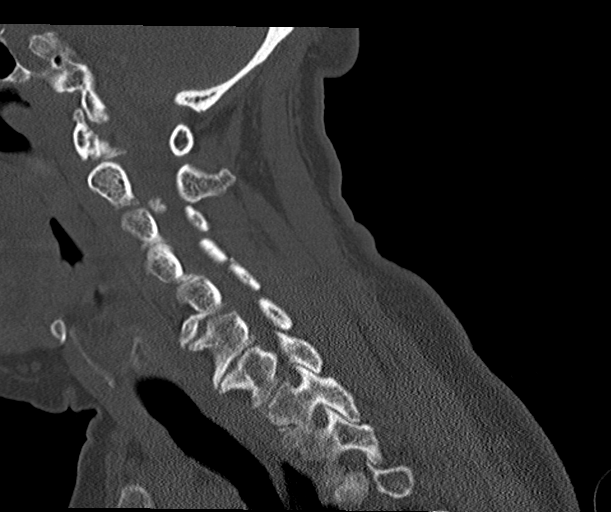
[im 37/55  bone]
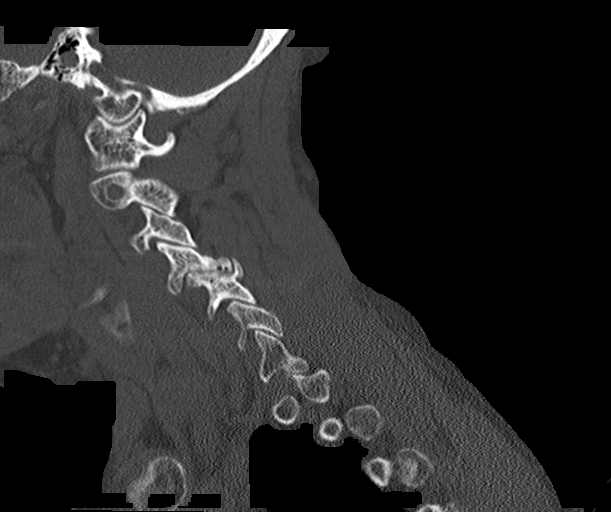

[Series 6: cor bone · coronal · 0.23mm/px · 3 of 61 slices shown]
[im 17/61  bone]
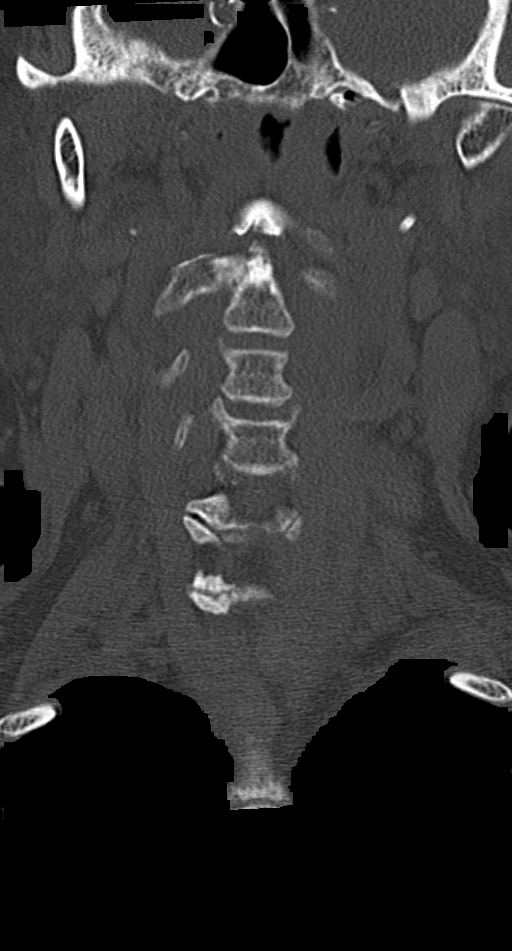
[im 26/61  bone]
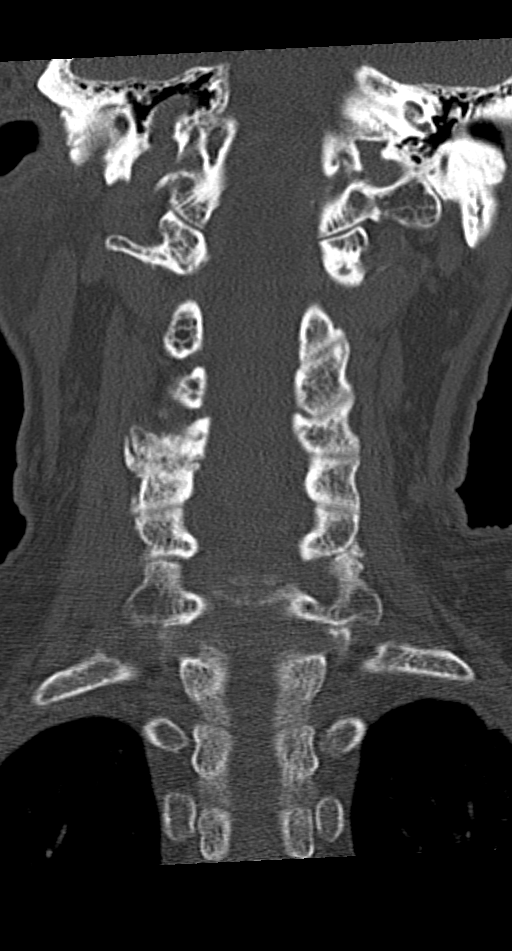
[im 35/61  bone]
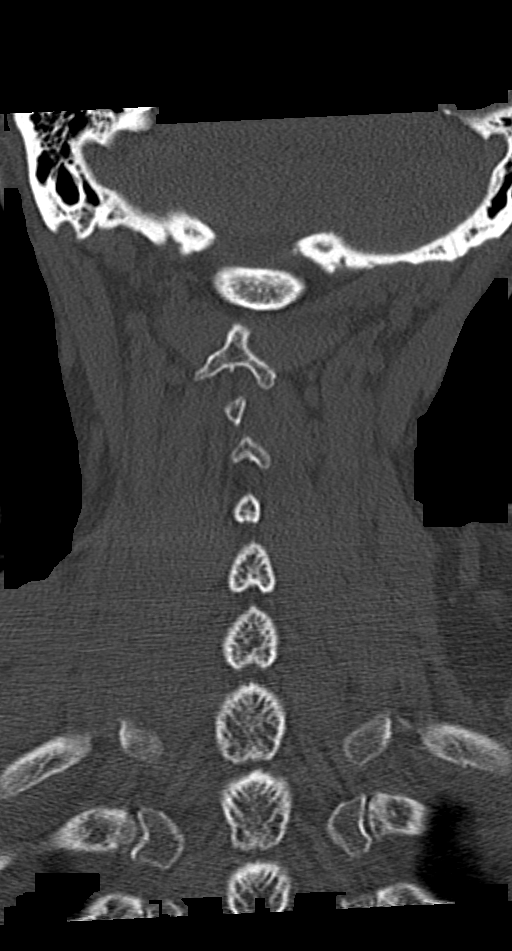

[Series 7: orthogonal axials · axial · 0.21mm/px · z∈[-97,-5]mm · 5 of 83 slices shown, 7 images]
[im 14/83  soft-tissue]
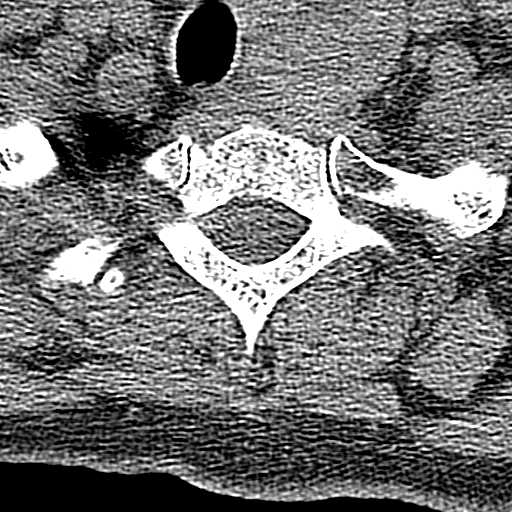
[im 14/83  bone]
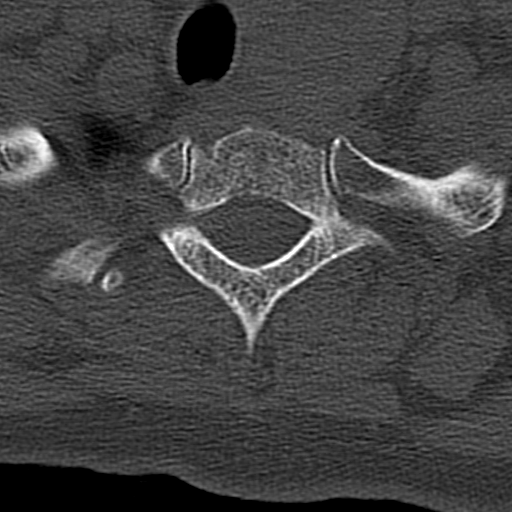
[im 28/83  bone]
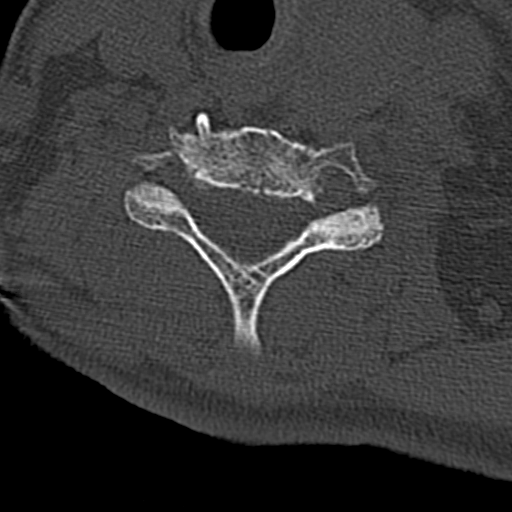
[im 42/83  bone]
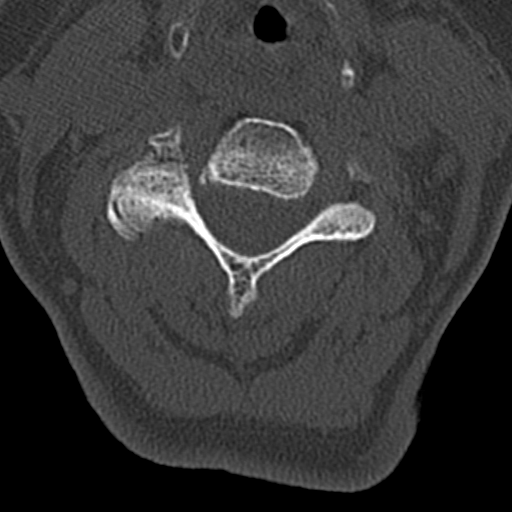
[im 55/83  bone]
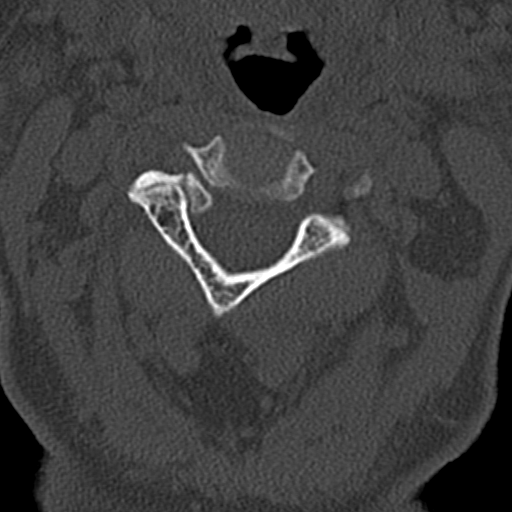
[im 69/83  soft-tissue]
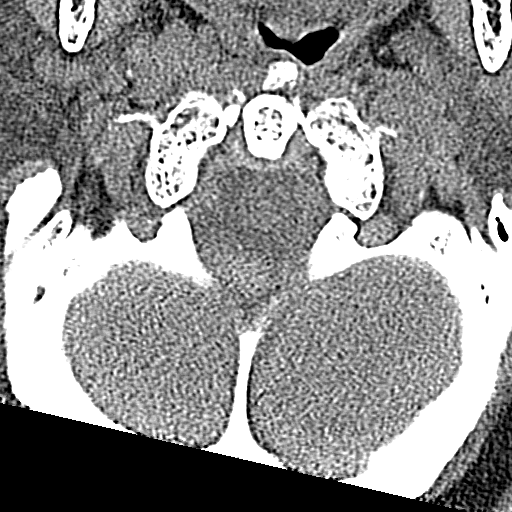
[im 69/83  bone]
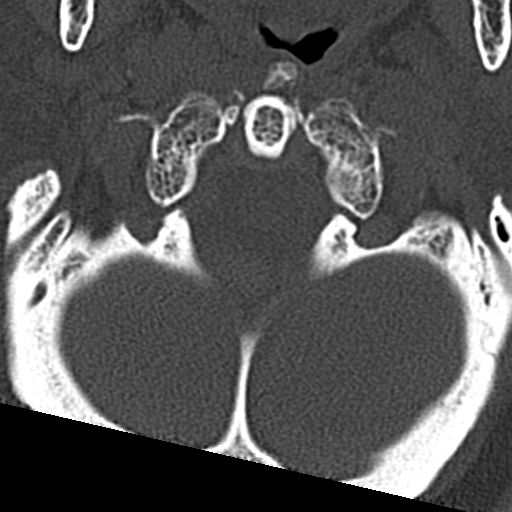

[13 of 33 positions shown; findings below may reference images not displayed]

FINDINGS: Alignment: 3 mm of anterolisthesis at C4-5, degenerative. Slight
retrolisthesis at C3-4

Skull base and vertebrae: No acute fracture. No primary bone lesion
or focal pathologic process.

Soft tissues and spinal canal: Atrophic or resected right thyroid
gland. Nodular and enlarged left thyroid, stable.

Disc levels:

C2-3: Bulky right-sided spur or ossification from the medial right
facet with right impingement of the foramen.

C3-4: Disc narrowing and central disc protrusion. The canal and
foramina are patent

C4-5: Bulky degenerative facet spurring on the right with
anterolisthesis. Moderate or advanced right foraminal stenosis.

C5-6: Spondylosis.  No bony impingement

C6-7: Spondylosis.  No bony impingement

C7-T1:Unremarkable.

Upper chest: Negative
IMPRESSION: 1. No emergent finding.
2. C4-5 advanced right facet arthropathy with anterolisthesis and
right foraminal stenosis.
3. C2-3 right foraminal impingement.
4. No bony impingement in the symptomatic left neck.

## 2019-09-09 ENCOUNTER — Other Ambulatory Visit: Payer: Self-pay

## 2019-09-11 DIAGNOSIS — G890 Central pain syndrome: Secondary | ICD-10-CM | POA: Diagnosis not present

## 2019-09-11 DIAGNOSIS — Z79891 Long term (current) use of opiate analgesic: Secondary | ICD-10-CM | POA: Diagnosis not present

## 2019-09-11 DIAGNOSIS — I679 Cerebrovascular disease, unspecified: Secondary | ICD-10-CM | POA: Diagnosis not present

## 2019-09-11 DIAGNOSIS — L259 Unspecified contact dermatitis, unspecified cause: Secondary | ICD-10-CM | POA: Diagnosis not present

## 2019-09-13 ENCOUNTER — Telehealth: Payer: Self-pay | Admitting: Student

## 2019-09-13 NOTE — Telephone Encounter (Signed)
Monitor results given

## 2019-09-13 NOTE — Telephone Encounter (Signed)
Returning Kisha's call for monitor results [phone]

## 2019-09-17 ENCOUNTER — Other Ambulatory Visit: Payer: Self-pay | Admitting: Family Medicine

## 2019-09-20 ENCOUNTER — Encounter: Payer: Self-pay | Admitting: Family Medicine

## 2019-09-20 ENCOUNTER — Ambulatory Visit (INDEPENDENT_AMBULATORY_CARE_PROVIDER_SITE_OTHER): Payer: Medicare Other | Admitting: Family Medicine

## 2019-09-20 ENCOUNTER — Other Ambulatory Visit: Payer: Self-pay

## 2019-09-20 VITALS — BP 132/72 | HR 66 | Temp 98.7°F | Resp 16 | Ht 64.0 in | Wt 163.0 lb

## 2019-09-20 DIAGNOSIS — Z0001 Encounter for general adult medical examination with abnormal findings: Secondary | ICD-10-CM

## 2019-09-20 DIAGNOSIS — E782 Mixed hyperlipidemia: Secondary | ICD-10-CM | POA: Diagnosis not present

## 2019-09-20 DIAGNOSIS — N1832 Chronic kidney disease, stage 3b: Secondary | ICD-10-CM

## 2019-09-20 DIAGNOSIS — E039 Hypothyroidism, unspecified: Secondary | ICD-10-CM | POA: Diagnosis not present

## 2019-09-20 DIAGNOSIS — L239 Allergic contact dermatitis, unspecified cause: Secondary | ICD-10-CM

## 2019-09-20 DIAGNOSIS — Z85038 Personal history of other malignant neoplasm of large intestine: Secondary | ICD-10-CM | POA: Diagnosis not present

## 2019-09-20 DIAGNOSIS — I10 Essential (primary) hypertension: Secondary | ICD-10-CM

## 2019-09-20 DIAGNOSIS — Z0000 Encounter for general adult medical examination without abnormal findings: Secondary | ICD-10-CM

## 2019-09-20 DIAGNOSIS — E119 Type 2 diabetes mellitus without complications: Secondary | ICD-10-CM

## 2019-09-20 MED ORDER — LINACLOTIDE 72 MCG PO CAPS: 72.0000 ug | ORAL_CAPSULE | Freq: Every day | ORAL | 2 refills | Status: DC | PRN

## 2019-09-20 MED ORDER — CEPHALEXIN 250 MG PO CAPS: 250.0000 mg | ORAL_CAPSULE | Freq: Two times a day (BID) | ORAL | 0 refills | Status: DC

## 2019-09-20 MED ORDER — AMLODIPINE BESYLATE 5 MG PO TABS: 5.0000 mg | ORAL_TABLET | Freq: Every day | ORAL | 3 refills | Status: DC

## 2019-09-20 NOTE — Patient Instructions (Signed)
F/U 4 months  We will call with lab results  

## 2019-09-20 NOTE — Progress Notes (Signed)
Subjective:   Patient presents for Medicare Annual/Subsequent preventive examination.   Patient here for annual wellness visit.  Her major concern is a rash that occurred beneath the area where she had her previous heart monitor.  She wore the heart monitor for a month.  When it was removed she has significant erythema with a yellow discharge.  They had to scrape the skin before the monitor was applied.  The rash is actually larger than the area of the monitor.  Her daughter showed me pictures it appeared to have a pussy-like substance.  They were initially using Neosporin and switch to Polysporin with minimal improvement.  Was seen by her neurologist for routine visit and they recently started hydrocortisone and there has been some improvement but she still has a yellow discharge.  She also has a rash that she gets intermittently in the axilla on the left side she has an appointment already scheduled with her dermatologist for this.  Cardiac arrhythmia she did receive results of her monitor.  She had one episode of sustained tachycardia but otherwise had some PVCs and PACs.  Was recommended that she can increase her beta-blocker if she was symptomatic otherwise no changes. At this point she has not required an increase in her Lopressor.  CKD she is due for repeat renal function.  Urinary symptoms.  Hyperlipidemia she is taking her Lipitor as prescribed  History of pulmonary embolism as well as colon cancer.  She has follow-up with oncology next month. She is currently on aspirin  Review Past Medical/Family/Social: per EMR    Risk Factors  Current exercise habits: some walking Dietary issues discussed: Yes  Cardiac risk factors: HTN , CKD  Depression Screen  (Note: if answer to either of the following is "Yes", a more complete depression screening is indicated)  Over the past two weeks, have you felt down, depressed or hopeless? No Over the past two weeks, have you felt little interest  or pleasure in doing things? No Have you lost interest or pleasure in daily life? No Do you often feel hopeless? No Do you cry easily over simple problems? No   Activities of Daily Living  In your present state of health, do you have any difficulty performing the following activities?:  Driving? Yes  Managing money? No  Feeding yourself? No  Getting from bed to chair? No  Climbing a flight of stairs? No  Preparing food and eating?: No  Bathing or showering? No  Getting dressed: No  Getting to the toilet? No  Using the toilet:No  Moving around from place to place: No  In the past year have you fallen or had a near fall?:No  Are you sexually active? No  Do you have more than one partner? No   Hearing Difficulties:  No Do you often ask people to speak up or repeat themselves? No  Do you experience ringing or noises in your ears? No Do you have difficulty understanding soft or whispered voices? No Do you feel that you have a problem with memory? No Do you often misplace items? No  Do you feel safe at home? Yes  Cognitive Testing  Alert? Yes Normal Appearance?Yes  Oriented to person? Yes Place? Yes  Time? Yes  Recall of three objects? Yes  Can perform simple calculations? Yes  Displays appropriate judgment?Yes  Can read the correct time from a watch face?Yes   List the Names of Other Physician/Practitioners you currently use:  Cardiology  Orthopedics- Dr. Luna Glasgow Neurology- Dr.  [place]  Dr. Nevada Crane - Dermatology   Ophthalmology     Screening Tests / Date Colonoscopy   2015             Zostavax  UTD Pneumonia- UTD Mammogram UTD Influenza Vaccine  UTD Tetanus/tdap - declines for now    ROS: ,GEN- denies fatigue, fever, weight loss,weakness, recent illness HEENT- denies eye drainage, change in vision, nasal discharge, CVS- denies chest pain, palpitations RESP- denies SOB, cough, wheeze ABD- denies N/V, change in stools, abd pain GU- denies dysuria,  hematuria, dribbling, incontinence MSK- + joint pain, muscle aches, injury Neuro- denies headache, dizziness, syncope, seizure activity  Physical: Vitals reviewed  GEN- NAD, alert and oriented x3 HEENT- PERRL, EOMI, non injected sclera, pink conjunctiva, MMM, oropharynx clear Neck- Supple, no thryomegaly, no bruit  CVS- RRR, no murmur RESP-CTAB ABD-NABS,soft,NT,ND Skin Left chest wall 5x 7cm area of raised erythema, yellow papules in center, erythema with peeling skin in left axilla  EXT- No edema Pulses- Radial, DP- 2+    Assessment:    Annual wellness medicare exam   Plan:    During the course of the visit the patient was educated and counseled about appropriate screening and preventive services including:   FALL/DEPRESSION/AUDIT C NEGATIVE  Hypothyroidism- continue synthroid  Ostepenia- continue vitamin D, walking/weight bearing activity asmuch as possible   No further mammogram   No further colonoscopy at age   Skin Rash - Allergic dermatitis to adhesive with prolonged healing- has been 1 month  add keflex 250mg  BID x 7 days, continue hydrocortisone, F/U derm this month  Cardiac arrythemia, maintained on lopressor   Osteoarthritis ultram as needed   HTn- controlled CKD- recheck renal function   DM- diet controlled, on statin   Advanced directives- full code  Diet review for nutrition referral? Yes ____ Not Indicated __x__  Patient Instructions (the written plan) was given to the patient.  Medicare Attestation  I have personally reviewed:  The patient's medical and social history  Their use of alcohol, tobacco or illicit drugs  Their current medications and supplements  The patient's functional ability including ADLs,fall risks, home safety risks, cognitive, and hearing and visual impairment  Diet and physical activities  Evidence for depression or mood disorders  The patient's weight, height, BMI, and visual acuity have been recorded in the  chart. I have made referrals, counseling, and provided education to the patient based on review of the above and I have provided the patient with a written personalized care plan for preventive services.

## 2019-09-21 LAB — CBC WITH DIFFERENTIAL/PLATELET
Absolute Monocytes: 525 cells/uL (ref 200–950)
Basophils Absolute: 53 cells/uL (ref 0–200)
Basophils Relative: 0.9 %
Eosinophils Absolute: 271 cells/uL (ref 15–500)
Eosinophils Relative: 4.6 %
HCT: 40.9 % (ref 35.0–45.0)
Hemoglobin: 13.4 g/dL (ref 11.7–15.5)
Lymphs Abs: 2159 cells/uL (ref 850–3900)
MCH: 31.7 pg (ref 27.0–33.0)
MCHC: 32.8 g/dL (ref 32.0–36.0)
MCV: 96.7 fL (ref 80.0–100.0)
MPV: 9.8 fL (ref 7.5–12.5)
Monocytes Relative: 8.9 %
Neutro Abs: 2891 cells/uL (ref 1500–7800)
Neutrophils Relative %: 49 %
Platelets: 278 10*3/uL (ref 140–400)
RBC: 4.23 10*6/uL (ref 3.80–5.10)
RDW: 14 % (ref 11.0–15.0)
Total Lymphocyte: 36.6 %
WBC: 5.9 10*3/uL (ref 3.8–10.8)

## 2019-09-21 LAB — TSH: TSH: 0.36 mIU/L — ABNORMAL LOW (ref 0.40–4.50)

## 2019-09-21 LAB — MICROALBUMIN / CREATININE URINE RATIO
Creatinine, Urine: 141 mg/dL (ref 20–275)
Microalb Creat Ratio: 10 mcg/mg creat (ref <30)
Microalb, Ur: 1.4 mg/dL

## 2019-09-21 LAB — HEMOGLOBIN A1C
Hgb A1c MFr Bld: 5.7 % of total Hgb — ABNORMAL HIGH (ref <5.7)
Mean Plasma Glucose: 117 (calc)
eAG (mmol/L): 6.5 (calc)

## 2019-09-21 LAB — COMPREHENSIVE METABOLIC PANEL
AG Ratio: 1.4 (calc) (ref 1.0–2.5)
ALT: 13 U/L (ref 6–29)
AST: 17 U/L (ref 10–35)
Albumin: 4.2 g/dL (ref 3.6–5.1)
Alkaline phosphatase (APISO): 113 U/L (ref 37–153)
BUN/Creatinine Ratio: 13 (calc) (ref 6–22)
BUN: 19 mg/dL (ref 7–25)
CO2: 28 mmol/L (ref 20–32)
Calcium: 9.6 mg/dL (ref 8.6–10.4)
Chloride: 105 mmol/L (ref 98–110)
Creat: 1.52 mg/dL — ABNORMAL HIGH (ref 0.60–0.88)
Globulin: 3 g/dL (calc) (ref 1.9–3.7)
Glucose, Bld: 89 mg/dL (ref 65–99)
Potassium: 4.3 mmol/L (ref 3.5–5.3)
Sodium: 141 mmol/L (ref 135–146)
Total Bilirubin: 0.9 mg/dL (ref 0.2–1.2)
Total Protein: 7.2 g/dL (ref 6.1–8.1)

## 2019-09-21 LAB — LIPID PANEL
Cholesterol: 130 mg/dL (ref <200)
HDL: 46 mg/dL — ABNORMAL LOW (ref >=50)
LDL Cholesterol (Calc): 65 mg/dL (calc)
Non-HDL Cholesterol (Calc): 84 mg/dL (calc) (ref <130)
Total CHOL/HDL Ratio: 2.8 (calc) (ref <5.0)
Triglycerides: 107 mg/dL (ref <150)

## 2019-09-30 DIAGNOSIS — L258 Unspecified contact dermatitis due to other agents: Secondary | ICD-10-CM | POA: Diagnosis not present

## 2019-09-30 DIAGNOSIS — L0100 Impetigo, unspecified: Secondary | ICD-10-CM | POA: Diagnosis not present

## 2019-10-21 ENCOUNTER — Telehealth: Payer: Self-pay | Admitting: Family Medicine

## 2019-10-21 ENCOUNTER — Inpatient Hospital Stay (HOSPITAL_COMMUNITY): Payer: Medicare Other | Attending: Hematology

## 2019-10-21 ENCOUNTER — Other Ambulatory Visit: Payer: Self-pay

## 2019-10-21 DIAGNOSIS — Z85038 Personal history of other malignant neoplasm of large intestine: Secondary | ICD-10-CM | POA: Insufficient documentation

## 2019-10-21 DIAGNOSIS — I2699 Other pulmonary embolism without acute cor pulmonale: Secondary | ICD-10-CM

## 2019-10-21 DIAGNOSIS — Z86711 Personal history of pulmonary embolism: Secondary | ICD-10-CM | POA: Diagnosis not present

## 2019-10-21 DIAGNOSIS — Z7982 Long term (current) use of aspirin: Secondary | ICD-10-CM | POA: Insufficient documentation

## 2019-10-21 LAB — COMPREHENSIVE METABOLIC PANEL
ALT: 19 U/L (ref 0–44)
AST: 19 U/L (ref 15–41)
Albumin: 4 g/dL (ref 3.5–5.0)
Alkaline Phosphatase: 107 U/L (ref 38–126)
Anion gap: 7 (ref 5–15)
BUN: 18 mg/dL (ref 8–23)
CO2: 26 mmol/L (ref 22–32)
Calcium: 9.2 mg/dL (ref 8.9–10.3)
Chloride: 105 mmol/L (ref 98–111)
Creatinine, Ser: 1.38 mg/dL — ABNORMAL HIGH (ref 0.44–1.00)
GFR calc Af Amer: 41 mL/min — ABNORMAL LOW (ref >60)
GFR calc non Af Amer: 36 mL/min — ABNORMAL LOW (ref >60)
Glucose, Bld: 94 mg/dL (ref 70–99)
Potassium: 4 mmol/L (ref 3.5–5.1)
Sodium: 138 mmol/L (ref 135–145)
Total Bilirubin: 0.9 mg/dL (ref 0.3–1.2)
Total Protein: 7.8 g/dL (ref 6.5–8.1)

## 2019-10-21 LAB — CBC WITH DIFFERENTIAL/PLATELET
Abs Immature Granulocytes: 0.01 10*3/uL (ref 0.00–0.07)
Basophils Absolute: 0.1 10*3/uL (ref 0.0–0.1)
Basophils Relative: 1 %
Eosinophils Absolute: 0.4 10*3/uL (ref 0.0–0.5)
Eosinophils Relative: 7 %
HCT: 43.3 % (ref 36.0–46.0)
Hemoglobin: 13.6 g/dL (ref 12.0–15.0)
Immature Granulocytes: 0 %
Lymphocytes Relative: 32 %
Lymphs Abs: 1.6 10*3/uL (ref 0.7–4.0)
MCH: 31.1 pg (ref 26.0–34.0)
MCHC: 31.4 g/dL (ref 30.0–36.0)
MCV: 98.9 fL (ref 80.0–100.0)
Monocytes Absolute: 0.6 10*3/uL (ref 0.1–1.0)
Monocytes Relative: 12 %
Neutro Abs: 2.4 10*3/uL (ref 1.7–7.7)
Neutrophils Relative %: 48 %
Platelets: 258 10*3/uL (ref 150–400)
RBC: 4.38 MIL/uL (ref 3.87–5.11)
RDW: 14.6 % (ref 11.5–15.5)
WBC: 5.1 10*3/uL (ref 4.0–10.5)
nRBC: 0 % (ref 0.0–0.2)

## 2019-10-21 LAB — D-DIMER, QUANTITATIVE: D-Dimer, Quant: 3.43 ug/mL-FEU — ABNORMAL HIGH (ref 0.00–0.50)

## 2019-10-21 NOTE — Progress Notes (Signed)
  Chronic Care Management   Note  10/21/2019 Name: [name] MRN: [id number] DOB: [birth year]  [name] is a 85 y.o. year old female who is a primary care patient of [place], Modena Nunnery, MD. I reached out to [name] by phone today in response to a referral sent by Ms. Marliss Czar PCP, Alycia Rossetti, MD.   Ms. Youkhana was given information about Chronic Care Management services today including:  1. CCM service includes personalized support from designated clinical staff supervised by her physician, including individualized plan of care and coordination with other care providers 2. 24/7 contact phone numbers for assistance for urgent and routine care needs. 3. Service will only be billed when office clinical staff spend 20 minutes or more in a month to coordinate care. 4. Only one practitioner may furnish and bill the service in a calendar month. 5. The patient may stop CCM services at any time (effective at the end of the month) by phone call to the office staff.   Patient wishes to consider information provided and/or speak with a member of the care team before deciding about enrollment in care management services.   Follow up plan:   Carley Perdue [employer] Scheduler

## 2019-10-22 ENCOUNTER — Ambulatory Visit (HOSPITAL_COMMUNITY): Payer: Medicare Other | Admitting: Nurse Practitioner

## 2019-10-28 ENCOUNTER — Ambulatory Visit: Payer: Medicare Other | Admitting: Cardiovascular Disease

## 2019-10-30 ENCOUNTER — Ambulatory Visit (HOSPITAL_COMMUNITY)
Admission: RE | Admit: 2019-10-30 | Discharge: 2019-10-30 | Disposition: A | Discharge status: Home / Self Care | Payer: Medicare Other | Source: Ambulatory Visit | Attending: Nurse Practitioner | Admitting: Nurse Practitioner

## 2019-10-30 ENCOUNTER — Inpatient Hospital Stay (HOSPITAL_COMMUNITY): Payer: Medicare Other | Admitting: Nurse Practitioner

## 2019-10-30 ENCOUNTER — Other Ambulatory Visit: Payer: Self-pay

## 2019-10-30 VITALS — BP 144/78 | HR 66 | Temp 96.8°F | Resp 18 | Wt 174.6 lb

## 2019-10-30 DIAGNOSIS — Z7982 Long term (current) use of aspirin: Secondary | ICD-10-CM | POA: Diagnosis not present

## 2019-10-30 DIAGNOSIS — R7989 Other specified abnormal findings of blood chemistry: Secondary | ICD-10-CM | POA: Diagnosis not present

## 2019-10-30 DIAGNOSIS — Z86711 Personal history of pulmonary embolism: Secondary | ICD-10-CM

## 2019-10-30 DIAGNOSIS — Z85038 Personal history of other malignant neoplasm of large intestine: Secondary | ICD-10-CM | POA: Diagnosis not present

## 2019-10-30 NOTE — Progress Notes (Signed)
[name], [name] [id number]   CLINIC:  Medical Oncology/Hematology  PCP:  Alycia Rossetti, MD [address] [place] [address] [phone]   REASON FOR VISIT: Follow-up for pulmonary embolism   CURRENT THERAPY: Aspirin 325 mg  CANCER STAGING: Cancer Staging Colon cancer [facility]) Staging form: Colon and Rectum, AJCC 7th Edition - Clinical: Stage I (T1, N0, M0) - Unsigned    INTERVAL HISTORY:  [name] 85 y.o. female returns for routine follow-up for pulmonary embolism.  Patient reports she has been feeling fine since her last visit.  She denies any new leg pain or cramps.  She denies any new shortness of breath or chest pain. Denies any nausea, vomiting, or diarrhea. Denies any new pains. Had not noticed any recent bleeding such as epistaxis, hematuria or hematochezia. Denies recent chest pain on exertion, shortness of breath on minimal exertion, pre-syncopal episodes, or palpitations. Denies any numbness or tingling in hands or feet. Denies any recent fevers, infections, or recent hospitalizations. Patient reports appetite at 100% and energy level at 50%.    REVIEW OF SYSTEMS:  Review of Systems  Gastrointestinal: Positive for constipation.  Neurological: Positive for dizziness.  All other systems reviewed and are negative.    PAST MEDICAL/SURGICAL HISTORY:  Past Medical History:  Diagnosis Date  . Allergy   . Arthritis   . Colon cancer ([name])    colon ca dx 07/30/09  . History of cardiac monitoring 07/2017   "Event monitor demonstrated sinus rhythm with isolated PACs and no arrhythmias"  . History of colon cancer 06/2009   found at time of TCS 06/29/09, 1.2cm sessile cecal polyp, no adjuvent therapy needed  . HTN (hypertension)   . Hx of cardiovascular stress test 07/2017   "No diagnostic ST segment changes to indicate ischemia. Small, moderate intensity, reversible apical to basal inferolateral defect consistent  with ischemia. This is a low risk study. Nuclear stress EF: 84%."  . Hyperlipidemia   . Hypothyroidism   . PE (pulmonary thromboembolism) ([name])   . Renal disorder    cyst on kidney   . Stroke ([name])   . Vertigo    Past Surgical History:  Procedure Laterality Date  . ABDOMINAL HYSTERECTOMY    . COLON SURGERY  07/2009   right hemicolectomy, no residual colon cancer on path  . COLONOSCOPY  07/16/2010   [id number] POLYP-TCS 3 YEARS  . COLONOSCOPY N/A 08/02/2013   hyperplastic polyps, surveillance in 2020 if benefits outweight the risks  . COLONOSCOPY  06/2009   1.2 cm sessile cecal polyp which had adenocarcinoma arising in a tubular adenoma.  Marland Kitchen PARTIAL THYMECTOMY    . partial thyroidectomy     benign tumors     SOCIAL HISTORY:  Social History   Socioeconomic History  . Marital status: Widowed    Spouse name: Not on file  . Number of children: 2  . Years of education: Not on file  . Highest education level: Not on file  Occupational History  . Occupation: Psychologist, occupational at [employer]  . Occupation: retired from [employer]  Tobacco Use  . Smoking status: Never Smoker  . Smokeless tobacco: Never Used  Vaping Use  . Vaping Use: Never used  Substance and Sexual Activity  . Alcohol use: No    Alcohol/week: 0.0 standard drinks  . Drug use: No  . Sexual activity: Not Currently  Other Topics Concern  . Not on file  Social History Narrative  . Not on file   Social Determinants of Health   Financial Resource Strain:   . Difficulty of Paying Living Expenses:   Food Insecurity:   . Worried About Charity fundraiser in the Last Year:   . Arboriculturist in the Last Year:   Transportation Needs:   . Film/video editor (Medical):   Marland Kitchen Lack of Transportation (Non-Medical):   Physical Activity:   . Days of Exercise per Week:   . Minutes of Exercise per Session:   Stress:   . Feeling of Stress :   Social Connections:   . Frequency of Communication with  Friends and Family:   . Frequency of Social Gatherings with Friends and Family:   . Attends Religious Services:   . Active Member of Clubs or Organizations:   . Attends Archivist Meetings:   Marland Kitchen Marital Status:   Intimate Partner Violence:   . Fear of Current or Ex-Partner:   . Emotionally Abused:   Marland Kitchen Physically Abused:   . Sexually Abused:     FAMILY HISTORY:  Family History  Problem Relation Age of Onset  . Colon cancer Mother        >age60  . Arthritis Mother   . Cancer Mother   . Heart disease Mother   . Hyperlipidemia Mother   . Hypertension Mother   . Heart attack Father   . Heart disease Father   . Diabetes Maternal Aunt   . Hyperlipidemia Daughter   . Hypertension Daughter   . Liver disease Neg Hx     CURRENT MEDICATIONS:  Outpatient Encounter Medications as of 10/30/2019  Medication Sig  . acetaminophen (TYLENOL) 500 MG tablet Take 1,000 mg by mouth every 8 (eight) hours as needed for mild pain or headache.   Marland Kitchen amLODipine (NORVASC) 5 MG tablet Take 1 tablet (5 mg total) by mouth daily.  . ASPERCREME LIDOCAINE EX Apply 1 application topically daily as needed (for knee pain).   Marland Kitchen aspirin 325 MG EC tablet Take 162.5-325 mg by mouth daily.  Marland Kitchen atorvastatin (LIPITOR) 40 MG tablet TAKE 2 TABLETS BY MOUTH ONCE A DAY. (Patient taking differently: Take 80 mg by mouth every evening. )  . cholecalciferol (VITAMIN D3) 25 MCG (1000 UT) tablet Take 1,000 Units by mouth daily.   . fluocinonide cream (LIDEX) 0.05 % Apply topically 2 (two) times daily as needed.  . gabapentin (NEURONTIN) 300 MG capsule Take 1 in the morning and 1 in the evening (Patient taking differently: Take 300 mg by mouth 2 (two) times daily. Take 1 in the morning and 1 in the evening)  . levETIRAcetam (KEPPRA) 250 MG tablet Take 1 tablet (250 mg total) by mouth 2 (two) times daily. (Patient taking differently: Take 125 mg by mouth 2 (two) times daily. Patient can only tolerate 1/2 tab)  . linaclotide  (LINZESS) 72 MCG capsule Take 1 capsule (72 mcg total) by mouth daily as needed (for constipation).  Marland Kitchen loratadine (CLARITIN) 10 MG tablet Take 10 mg by mouth daily.   . metoprolol tartrate (LOPRESSOR) 25 MG tablet TAKE 1&1/2 TABLET BY MOUTH TWICE A DAY. (Patient taking differently: Take 37.5 mg by mouth 2 (two) times daily. )  . mupirocin ointment (BACTROBAN) 2 % Apply topically 3 (three) times daily as needed.  . RESTASIS 0.05 % ophthalmic emulsion Place 1 drop into both eyes 2 (two) times daily as needed (chronic dry eye).   . traMADol (ULTRAM) 50 MG  tablet Take 25 mg by mouth every 8 (eight) hours as needed for moderate pain.   . [DISCONTINUED] cephALEXin (KEFLEX) 250 MG capsule Take 1 capsule (250 mg total) by mouth 2 (two) times daily.   No facility-administered encounter medications on file as of 10/30/2019.    ALLERGIES:  No Known Allergies   PHYSICAL EXAM:  ECOG Performance status: 1  Vitals:   10/30/19 1359  BP: (!) 144/78  Pulse: 66  Resp: 18  Temp: (!) 96.8 F (36 C)  SpO2: 100%   Filed Weights   10/30/19 1359  Weight: 174 lb 9.7 oz (79.2 kg)   Physical Exam Constitutional:      Appearance: Normal appearance. She is normal weight.  Cardiovascular:     Rate and Rhythm: Normal rate and regular rhythm.     Heart sounds: Normal heart sounds.  Pulmonary:     Effort: Pulmonary effort is normal.     Breath sounds: Normal breath sounds.  Abdominal:     General: Bowel sounds are normal.     Palpations: Abdomen is soft.  Musculoskeletal:        General: Normal range of motion.  Skin:    General: Skin is warm.  Neurological:     Mental Status: She is alert and oriented to person, place, and time. Mental status is at baseline.  Psychiatric:        Mood and Affect: Mood normal.        Behavior: Behavior normal.        Thought Content: Thought content normal.        Judgment: Judgment normal.      LABORATORY DATA:  I have reviewed the labs as listed.  CBC      Component Value Date/Time   WBC 5.1 10/21/2019 1248   RBC 4.38 10/21/2019 1248   HGB 13.6 10/21/2019 1248   HGB 14.7 04/16/2015 0812   HCT 43.3 10/21/2019 1248   HCT 44.2 04/16/2015 0812   PLT 258 10/21/2019 1248   PLT 332 04/16/2015 0812   MCV 98.9 10/21/2019 1248   MCV 93.8 04/16/2015 0812   MCH 31.1 10/21/2019 1248   MCHC 31.4 10/21/2019 1248   RDW 14.6 10/21/2019 1248   RDW 15.0 (H) 04/16/2015 0812   LYMPHSABS 1.6 10/21/2019 1248   LYMPHSABS 1.9 04/16/2015 0812   MONOABS 0.6 10/21/2019 1248   MONOABS 0.4 04/16/2015 0812   EOSABS 0.4 10/21/2019 1248   EOSABS 0.2 04/16/2015 0812   BASOSABS 0.1 10/21/2019 1248   BASOSABS 0.1 04/16/2015 0812   CMP Latest Ref Rng & Units 10/21/2019 09/20/2019 07/18/2019  Glucose 70 - 99 mg/dL 94 89 98  BUN 8 - 23 mg/dL 18 19 17   Creatinine 0.44 - 1.00 mg/dL 1.38(H) 1.52(H) 1.21(H)  Sodium 135 - 145 mmol/L 138 141 139  Potassium 3.5 - 5.1 mmol/L 4.0 4.3 3.9  Chloride 98 - 111 mmol/L 105 105 102  CO2 22 - 32 mmol/L 26 28 29   Calcium 8.9 - 10.3 mg/dL 9.2 9.6 9.5  Total Protein 6.5 - 8.1 g/dL 7.8 7.2 -  Total Bilirubin 0.3 - 1.2 mg/dL 0.9 0.9 -  Alkaline Phos 38 - 126 U/L 107 - -  AST 15 - 41 U/L 19 17 -  ALT 0 - 44 U/L 19 13 -    All questions were answered to patient's stated satisfaction. Encouraged patient to call with any new concerns or questions before his next visit to the cancer center and we can certain see  him sooner, if needed.     ASSESSMENT & PLAN:  History of pulmonary embolism 1.  Unprovoked pulmonary embolism: - CT PE protocol on 04/26/2018 showed bilateral pulmonary embolisms with small right pleural effusions. -Patient was started on Eliquis and is tolerating well. -Hypercoagulable work-up included factor V Leiden and prothrombin gene mutation were negative.  Lupus anticoagulant was positive while patient was on Eliquis.  Beta-2 glycoprotein 1 antibodies were negative. -No evidence of occult malignancy on CT scan of the  abdomen and pelvis. - We will assess the risk-benefit ratio of continuing anticoagulation.  Other alternatives is to change her to low-density anticoagulation with Eliquis 2.5 mg twice daily. -Patient was hospitalized in September for an intracranial hemorrhage.  She was taken off of her Eliquis.  She was placed on aspirin 325 mg daily. -She denies any lower leg edema swelling or redness.  Denies any shortness of breath. -Labs done on 10/21/2019 showed hemoglobin 13.6, platelets 258, D-dimer 3.43. -We will get bilateral lower extremity ultrasound stat -We will see her back after her ultrasounds.  2.  Colon cancer: -Cecal polypectomy on 06/29/2009 with adenocarcinoma arising in a tubular adenoma. -Right colon segmental resection with 0 out of 25 lymph nodes positive and no residual cancer. -Last colonoscopy was on 08/02/2013 showed hyperplastic polyp in the rectum and sigmoid colon. -CT of abdomen pelvis on 05/28/2018 did not show any evidence of malignancy.  She did not have any significant weight loss. -She will continue to have routine colonoscopies with GI.     Orders placed this encounter:  Orders Placed This Encounter  Procedures  . US Venous Img Lower Bilateral      Francene Finders, FNP-C [facility] [phone]

## 2019-10-30 NOTE — Assessment & Plan Note (Signed)
1.  Unprovoked pulmonary embolism: - CT PE protocol on 04/26/2018 showed bilateral pulmonary embolisms with small right pleural effusions. -Patient was started on Eliquis and is tolerating well. -Hypercoagulable work-up included factor V Leiden and prothrombin gene mutation were negative.  Lupus anticoagulant was positive while patient was on Eliquis.  Beta-2 glycoprotein 1 antibodies were negative. -No evidence of occult malignancy on CT scan of the abdomen and pelvis. - We will assess the risk-benefit ratio of continuing anticoagulation.  Other alternatives is to change her to low-density anticoagulation with Eliquis 2.5 mg twice daily. -Patient was hospitalized in September for an intracranial hemorrhage.  She was taken off of her Eliquis.  She was placed on aspirin 325 mg daily. -She denies any lower leg edema swelling or redness.  Denies any shortness of breath. -Labs done on 10/21/2019 showed hemoglobin 13.6, platelets 258, D-dimer 3.43. -We will get bilateral lower extremity ultrasound stat -We will see her back after her ultrasounds.  2.  Colon cancer: -Cecal polypectomy on 06/29/2009 with adenocarcinoma arising in a tubular adenoma. -Right colon segmental resection with 0 out of 25 lymph nodes positive and no residual cancer. -Last colonoscopy was on 08/02/2013 showed hyperplastic polyp in the rectum and sigmoid colon. -CT of abdomen pelvis on 05/28/2018 did not show any evidence of malignancy.  She did not have any significant weight loss. -She will continue to have routine colonoscopies with GI.

## 2019-10-31 ENCOUNTER — Ambulatory Visit (HOSPITAL_COMMUNITY): Payer: Medicare Other | Admitting: Nurse Practitioner

## 2019-11-25 ENCOUNTER — Other Ambulatory Visit: Payer: Self-pay | Admitting: Cardiology

## 2019-11-25 MED ORDER — METOPROLOL TARTRATE 25 MG PO TABS: 37.5000 mg | ORAL_TABLET | Freq: Two times a day (BID) | ORAL | 0 refills | Status: DC

## 2019-11-25 NOTE — Telephone Encounter (Signed)
Patient takes 25 mg-1 1/2 tablet (37.5 mg) BID, refilled #[phone]

## 2019-11-25 NOTE — Telephone Encounter (Signed)
Please call in metoprolol to [facility]. Daughter states Pt needs more than #[phone]   Please call [phone]    Thanks renee

## 2019-12-02 ENCOUNTER — Other Ambulatory Visit (HOSPITAL_COMMUNITY): Payer: Medicare Other

## 2019-12-02 ENCOUNTER — Other Ambulatory Visit (HOSPITAL_COMMUNITY): Payer: Self-pay

## 2019-12-02 DIAGNOSIS — C188 Malignant neoplasm of overlapping sites of colon: Secondary | ICD-10-CM

## 2019-12-02 DIAGNOSIS — Z86711 Personal history of pulmonary embolism: Secondary | ICD-10-CM

## 2019-12-03 ENCOUNTER — Inpatient Hospital Stay (HOSPITAL_COMMUNITY): Payer: Medicare Other | Attending: Hematology

## 2019-12-03 ENCOUNTER — Other Ambulatory Visit: Payer: Self-pay

## 2019-12-03 DIAGNOSIS — Z85038 Personal history of other malignant neoplasm of large intestine: Secondary | ICD-10-CM | POA: Insufficient documentation

## 2019-12-03 DIAGNOSIS — C188 Malignant neoplasm of overlapping sites of colon: Secondary | ICD-10-CM

## 2019-12-03 DIAGNOSIS — Z7901 Long term (current) use of anticoagulants: Secondary | ICD-10-CM | POA: Insufficient documentation

## 2019-12-03 DIAGNOSIS — Z86711 Personal history of pulmonary embolism: Secondary | ICD-10-CM | POA: Diagnosis not present

## 2019-12-03 LAB — COMPREHENSIVE METABOLIC PANEL
ALT: 20 U/L (ref 0–44)
AST: 19 U/L (ref 15–41)
Albumin: 3.9 g/dL (ref 3.5–5.0)
Alkaline Phosphatase: 105 U/L (ref 38–126)
Anion gap: 9 (ref 5–15)
BUN: 20 mg/dL (ref 8–23)
CO2: 28 mmol/L (ref 22–32)
Calcium: 9.3 mg/dL (ref 8.9–10.3)
Chloride: 102 mmol/L (ref 98–111)
Creatinine, Ser: 1.38 mg/dL — ABNORMAL HIGH (ref 0.44–1.00)
GFR calc Af Amer: 41 mL/min — ABNORMAL LOW (ref >60)
GFR calc non Af Amer: 35 mL/min — ABNORMAL LOW (ref >60)
Glucose, Bld: 105 mg/dL — ABNORMAL HIGH (ref 70–99)
Potassium: 3.7 mmol/L (ref 3.5–5.1)
Sodium: 139 mmol/L (ref 135–145)
Total Bilirubin: 0.9 mg/dL (ref 0.3–1.2)
Total Protein: 7.8 g/dL (ref 6.5–8.1)

## 2019-12-03 LAB — CBC WITH DIFFERENTIAL/PLATELET
Abs Immature Granulocytes: 0.01 10*3/uL (ref 0.00–0.07)
Basophils Absolute: 0.1 10*3/uL (ref 0.0–0.1)
Basophils Relative: 1 %
Eosinophils Absolute: 0.2 10*3/uL (ref 0.0–0.5)
Eosinophils Relative: 4 %
HCT: 44 % (ref 36.0–46.0)
Hemoglobin: 13.9 g/dL (ref 12.0–15.0)
Immature Granulocytes: 0 %
Lymphocytes Relative: 31 %
Lymphs Abs: 1.8 10*3/uL (ref 0.7–4.0)
MCH: 31.2 pg (ref 26.0–34.0)
MCHC: 31.6 g/dL (ref 30.0–36.0)
MCV: 98.9 fL (ref 80.0–100.0)
Monocytes Absolute: 0.4 10*3/uL (ref 0.1–1.0)
Monocytes Relative: 7 %
Neutro Abs: 3.3 10*3/uL (ref 1.7–7.7)
Neutrophils Relative %: 57 %
Platelets: 282 10*3/uL (ref 150–400)
RBC: 4.45 MIL/uL (ref 3.87–5.11)
RDW: 14.6 % (ref 11.5–15.5)
WBC: 5.8 10*3/uL (ref 4.0–10.5)
nRBC: 0 % (ref 0.0–0.2)

## 2019-12-03 LAB — D-DIMER, QUANTITATIVE: D-Dimer, Quant: 2.39 ug/mL-FEU — ABNORMAL HIGH (ref 0.00–0.50)

## 2019-12-04 ENCOUNTER — Ambulatory Visit (HOSPITAL_COMMUNITY): Payer: Medicare Other | Admitting: Nurse Practitioner

## 2019-12-05 ENCOUNTER — Inpatient Hospital Stay (HOSPITAL_BASED_OUTPATIENT_CLINIC_OR_DEPARTMENT_OTHER): Payer: Medicare Other | Admitting: Nurse Practitioner

## 2019-12-05 ENCOUNTER — Other Ambulatory Visit: Payer: Self-pay

## 2019-12-05 DIAGNOSIS — Z86711 Personal history of pulmonary embolism: Secondary | ICD-10-CM

## 2019-12-05 DIAGNOSIS — Z85038 Personal history of other malignant neoplasm of large intestine: Secondary | ICD-10-CM | POA: Diagnosis not present

## 2019-12-05 DIAGNOSIS — Z7901 Long term (current) use of anticoagulants: Secondary | ICD-10-CM | POA: Diagnosis not present

## 2019-12-05 NOTE — Progress Notes (Signed)
[place], [place] [address]   CLINIC:  Medical Oncology/Hematology  PCP:  Alycia Rossetti, MD [address] [place] [address] [phone]   REASON FOR VISIT: Follow-up for colon cancer and pulmonary embolism   CURRENT THERAPY: Observation  CANCER STAGING: Cancer Staging Colon cancer [facility]) Staging form: Colon and Rectum, AJCC 7th Edition - Clinical: Stage I (T1, N0, M0) - Unsigned    INTERVAL HISTORY:  Ms. Genao 85 y.o. female returns for routine follow-up for colon cancer and pulmonary embolism.  Patient reports she is doing well since her last visit.  She denies any new leg edema pain.  She still has the same joint knee pain worse on right knee. Denies any nausea, vomiting, or diarrhea. Denies any new pains. Had not noticed any recent bleeding such as epistaxis, hematuria or hematochezia. Denies recent chest pain on exertion, shortness of breath on minimal exertion, pre-syncopal episodes, or palpitations. Denies any numbness or tingling in hands or feet. Denies any recent fevers, infections, or recent hospitalizations. Patient reports appetite at 100% and energy level at 50%.  She is eating well maintain her weight this time.     REVIEW OF SYSTEMS:  Review of Systems  Respiratory: Positive for shortness of breath.   Musculoskeletal: Positive for arthralgias (Right knee).  Neurological: Positive for headaches.  All other systems reviewed and are negative.    PAST MEDICAL/SURGICAL HISTORY:  Past Medical History:  Diagnosis Date  . Allergy   . Arthritis   . Colon cancer ([place])    colon ca dx 07/30/09  . History of cardiac monitoring 07/2017   "Event monitor demonstrated sinus rhythm with isolated PACs and no arrhythmias"  . History of colon cancer 06/2009   found at time of TCS 06/29/09, 1.2cm sessile cecal polyp, no adjuvent therapy needed  . HTN (hypertension)   . Hx of cardiovascular stress test 07/2017    "No diagnostic ST segment changes to indicate ischemia. Small, moderate intensity, reversible apical to basal inferolateral defect consistent with ischemia. This is a low risk study. Nuclear stress EF: 84%."  . Hyperlipidemia   . Hypothyroidism   . PE (pulmonary thromboembolism) ([place])   . Renal disorder    cyst on kidney   . Stroke ([place])   . Vertigo    Past Surgical History:  Procedure Laterality Date  . ABDOMINAL HYSTERECTOMY    . COLON SURGERY  07/2009   right hemicolectomy, no residual colon cancer on path  . COLONOSCOPY  07/16/2010   [id number] POLYP-TCS 3 YEARS  . COLONOSCOPY N/A 08/02/2013   hyperplastic polyps, surveillance in 2020 if benefits outweight the risks  . COLONOSCOPY  06/2009   1.2 cm sessile cecal polyp which had adenocarcinoma arising in a tubular adenoma.  Marland Kitchen PARTIAL THYMECTOMY    . partial thyroidectomy     benign tumors     SOCIAL HISTORY:  Social History   Socioeconomic History  . Marital status: Widowed    Spouse name: Not on file  . Number of children: 2  . Years of education: Not on file  . Highest education level: Not on file  Occupational History  . Occupation: Psychologist, occupational at [employer]  . Occupation: retired from [employer]  Tobacco Use  . Smoking status: Never Smoker  . Smokeless tobacco: Never Used  Vaping Use  . Vaping Use: Never used  Substance and Sexual Activity  . Alcohol use: No  Alcohol/week: 0.0 standard drinks  . Drug use: No  . Sexual activity: Not Currently  Other Topics Concern  . Not on file  Social History Narrative  . Not on file   Social Determinants of Health   Financial Resource Strain:   . Difficulty of Paying Living Expenses: Not on file  Food Insecurity:   . Worried About Charity fundraiser in the Last Year: Not on file  . Ran Out of Food in the Last Year: Not on file  Transportation Needs:   . Lack of Transportation (Medical): Not on file  . Lack of Transportation (Non-Medical): Not on  file  Physical Activity:   . Days of Exercise per Week: Not on file  . Minutes of Exercise per Session: Not on file  Stress:   . Feeling of Stress : Not on file  Social Connections:   . Frequency of Communication with Friends and Family: Not on file  . Frequency of Social Gatherings with Friends and Family: Not on file  . Attends Religious Services: Not on file  . Active Member of Clubs or Organizations: Not on file  . Attends Archivist Meetings: Not on file  . Marital Status: Not on file  Intimate Partner Violence:   . Fear of Current or Ex-Partner: Not on file  . Emotionally Abused: Not on file  . Physically Abused: Not on file  . Sexually Abused: Not on file    FAMILY HISTORY:  Family History  Problem Relation Age of Onset  . Colon cancer Mother        >age60  . Arthritis Mother   . Cancer Mother   . Heart disease Mother   . Hyperlipidemia Mother   . Hypertension Mother   . Heart attack Father   . Heart disease Father   . Diabetes Maternal Aunt   . Hyperlipidemia Daughter   . Hypertension Daughter   . Liver disease Neg Hx     CURRENT MEDICATIONS:  Outpatient Encounter Medications as of 12/05/2019  Medication Sig  . acetaminophen (TYLENOL) 500 MG tablet Take 1,000 mg by mouth every 8 (eight) hours as needed for mild pain or headache.   Marland Kitchen amLODipine (NORVASC) 5 MG tablet Take 1 tablet (5 mg total) by mouth daily.  . ASPERCREME LIDOCAINE EX Apply 1 application topically daily as needed (for knee pain).   Marland Kitchen aspirin 325 MG EC tablet Take 162.5-325 mg by mouth daily.  Marland Kitchen atorvastatin (LIPITOR) 40 MG tablet TAKE 2 TABLETS BY MOUTH ONCE A DAY. (Patient taking differently: Take 80 mg by mouth every evening. )  . cholecalciferol (VITAMIN D3) 25 MCG (1000 UT) tablet Take 1,000 Units by mouth daily.   . fluocinonide cream (LIDEX) 0.05 % Apply topically 2 (two) times daily as needed.  . gabapentin (NEURONTIN) 300 MG capsule Take 1 in the morning and 1 in the evening  (Patient taking differently: Take 300 mg by mouth 2 (two) times daily. Take 1 in the morning and 1 in the evening)  . levETIRAcetam (KEPPRA) 250 MG tablet Take 1 tablet (250 mg total) by mouth 2 (two) times daily. (Patient taking differently: Take 125 mg by mouth 2 (two) times daily. Patient can only tolerate 1/2 tab)  . linaclotide (LINZESS) 72 MCG capsule Take 1 capsule (72 mcg total) by mouth daily as needed (for constipation).  Marland Kitchen loratadine (CLARITIN) 10 MG tablet Take 10 mg by mouth daily.   . metoprolol tartrate (LOPRESSOR) 25 MG tablet Take 1.5 tablets (37.5  mg total) by mouth 2 (two) times daily.  . mupirocin ointment (BACTROBAN) 2 % Apply topically 3 (three) times daily as needed.  . RESTASIS 0.05 % ophthalmic emulsion Place 1 drop into both eyes 2 (two) times daily as needed (chronic dry eye).   . traMADol (ULTRAM) 50 MG tablet Take 25 mg by mouth every 8 (eight) hours as needed for moderate pain.    No facility-administered encounter medications on file as of 12/05/2019.    ALLERGIES:  No Known Allergies   PHYSICAL EXAM:  ECOG Performance status: 1  Vitals:   12/05/19 1409  BP: (!) 144/66  Pulse: 69  Resp: 17  Temp: (!) 96.8 F (36 C)  SpO2: 97%   Filed Weights   12/05/19 1409  Weight: 161 lb 1.6 oz (73.1 kg)   Physical Exam Constitutional:      Appearance: Normal appearance. She is normal weight.  Cardiovascular:     Rate and Rhythm: Normal rate and regular rhythm.     Heart sounds: Normal heart sounds.  Pulmonary:     Effort: Pulmonary effort is normal.     Breath sounds: Normal breath sounds.  Abdominal:     General: Bowel sounds are normal.     Palpations: Abdomen is soft.  Musculoskeletal:        General: Normal range of motion.  Skin:    General: Skin is warm.  Neurological:     Mental Status: She is alert and oriented to person, place, and time. Mental status is at baseline.  Psychiatric:        Mood and Affect: Mood normal.        Behavior:  Behavior normal.        Thought Content: Thought content normal.        Judgment: Judgment normal.      LABORATORY DATA:  I have reviewed the labs as listed.  CBC    Component Value Date/Time   WBC 5.8 12/03/2019 1214   RBC 4.45 12/03/2019 1214   HGB 13.9 12/03/2019 1214   HGB 14.7 04/16/2015 0812   HCT 44.0 12/03/2019 1214   HCT 44.2 04/16/2015 0812   PLT 282 12/03/2019 1214   PLT 332 04/16/2015 0812   MCV 98.9 12/03/2019 1214   MCV 93.8 04/16/2015 0812   MCH 31.2 12/03/2019 1214   MCHC 31.6 12/03/2019 1214   RDW 14.6 12/03/2019 1214   RDW 15.0 (H) 04/16/2015 0812   LYMPHSABS 1.8 12/03/2019 1214   LYMPHSABS 1.9 04/16/2015 0812   MONOABS 0.4 12/03/2019 1214   MONOABS 0.4 04/16/2015 0812   EOSABS 0.2 12/03/2019 1214   EOSABS 0.2 04/16/2015 0812   BASOSABS 0.1 12/03/2019 1214   BASOSABS 0.1 04/16/2015 0812   CMP Latest Ref Rng & Units 12/03/2019 10/21/2019 09/20/2019  Glucose 70 - 99 mg/dL 105(H) 94 89  BUN 8 - 23 mg/dL 20 18 19   Creatinine 0.44 - 1.00 mg/dL 1.38(H) 1.38(H) 1.52(H)  Sodium 135 - 145 mmol/L 139 138 141  Potassium 3.5 - 5.1 mmol/L 3.7 4.0 4.3  Chloride 98 - 111 mmol/L 102 105 105  CO2 22 - 32 mmol/L 28 26 28   Calcium 8.9 - 10.3 mg/dL 9.3 9.2 9.6  Total Protein 6.5 - 8.1 g/dL 7.8 7.8 7.2  Total Bilirubin 0.3 - 1.2 mg/dL 0.9 0.9 0.9  Alkaline Phos 38 - 126 U/L 105 107 -  AST 15 - 41 U/L 19 19 17   ALT 0 - 44 U/L 20 19 13  DIAGNOSTIC IMAGING:  I have independently reviewed the ultrasound and discussed with the patient.  ASSESSMENT & PLAN:  History of pulmonary embolism 1.  Unprovoked pulmonary embolism: - CT PE protocol on 04/26/2018 showed bilateral pulmonary embolisms with small right pleural effusions. -Patient was started on Eliquis and is tolerating well. -Hypercoagulable work-up included factor V Leiden and prothrombin gene mutation were negative.  Lupus anticoagulant was positive while patient was on Eliquis.  Beta-2 glycoprotein 1 antibodies  were negative. -No evidence of occult malignancy on CT scan of the abdomen and pelvis. - We will assess the risk-benefit ratio of continuing anticoagulation.  Other alternatives is to change her to low-density anticoagulation with Eliquis 2.5 mg twice daily. -Patient was hospitalized in September for an intracranial hemorrhage.  She was taken off of her Eliquis.  She was placed on aspirin 325 mg daily. -She denies any lower leg edema swelling or redness.  Denies any shortness of breath. -Bilateral lower extremity ultrasound done on 10/30/2019 was negative for any DVTs. -Labs done on 12/03/2019 showed hemoglobin 13.9, platelets 282, D-dimer 2.39. -We will see her back in 2 months with repeat labs.  2.  Colon cancer: -Cecal polypectomy on 06/29/2009 with adenocarcinoma arising in a tubular adenoma. -Right colon segmental resection with 0 out of 25 lymph nodes positive and no residual cancer. -Last colonoscopy was on 08/02/2013 showed hyperplastic polyp in the rectum and sigmoid colon. -CT of abdomen pelvis on 05/28/2018 did not show any evidence of malignancy.  She did not have any significant weight loss. -She will continue to have routine colonoscopies with GI.     Orders placed this encounter:  Orders Placed This Encounter  Procedures  . Lactate dehydrogenase  . CBC with Differential/Platelet  . Comprehensive metabolic panel  . D-dimer, quantitative      Francene Finders, FNP-C [place] [phone]

## 2019-12-05 NOTE — Assessment & Plan Note (Signed)
1.  Unprovoked pulmonary embolism: - CT PE protocol on 04/26/2018 showed bilateral pulmonary embolisms with small right pleural effusions. -Patient was started on Eliquis and is tolerating well. -Hypercoagulable work-up included factor V Leiden and prothrombin gene mutation were negative.  Lupus anticoagulant was positive while patient was on Eliquis.  Beta-2 glycoprotein 1 antibodies were negative. -No evidence of occult malignancy on CT scan of the abdomen and pelvis. - We will assess the risk-benefit ratio of continuing anticoagulation.  Other alternatives is to change her to low-density anticoagulation with Eliquis 2.5 mg twice daily. -Patient was hospitalized in September for an intracranial hemorrhage.  She was taken off of her Eliquis.  She was placed on aspirin 325 mg daily. -She denies any lower leg edema swelling or redness.  Denies any shortness of breath. -Bilateral lower extremity ultrasound done on 10/30/2019 was negative for any DVTs. -Labs done on 12/03/2019 showed hemoglobin 13.9, platelets 282, D-dimer 2.39. -We will see her back in 2 months with repeat labs.  2.  Colon cancer: -Cecal polypectomy on 06/29/2009 with adenocarcinoma arising in a tubular adenoma. -Right colon segmental resection with 0 out of 25 lymph nodes positive and no residual cancer. -Last colonoscopy was on 08/02/2013 showed hyperplastic polyp in the rectum and sigmoid colon. -CT of abdomen pelvis on 05/28/2018 did not show any evidence of malignancy.  She did not have any significant weight loss. -She will continue to have routine colonoscopies with GI.

## 2019-12-16 ENCOUNTER — Ambulatory Visit: Payer: Medicare Other | Admitting: Cardiology

## 2019-12-18 ENCOUNTER — Other Ambulatory Visit: Payer: Self-pay

## 2019-12-18 ENCOUNTER — Ambulatory Visit (INDEPENDENT_AMBULATORY_CARE_PROVIDER_SITE_OTHER): Payer: Medicare Other

## 2019-12-18 DIAGNOSIS — Z23 Encounter for immunization: Secondary | ICD-10-CM

## 2020-01-15 NOTE — Progress Notes (Signed)
Cardiology Office Note    Date:  01/21/2020   ID:  [name], DOB November 18, 1934, MRN [id number]  PCP:  Alycia Rossetti, MD  Cardiologist: Kate Sable, MD (Inactive) TBD EPS: None  Chief Complaint  Patient presents with   Follow-up   Palpitations    History of Present Illness:  [name] is a 85 y.o. female with history of palpitations with monitor 09/09/19 showing PACs, PVCs and brief runs of SVT up to 150 bpm longest episode 11 seconds long managed with beta-blockers, hypertension, HLD.  Patient comes in with daughter. Having a lot of palpitations on some days and other days not at all. HR goes up for about a minute. Happens when she gets up at night to go to BR or even at rest. Makes her feel weak, nausea and sweaty Takes an extra metoprolol 25 mg about once or twice a weak . Every night about 6:30 she feels weak and nausea and she has to lay down. Has been going on for over a year. Has talked with PCP and neurologist. Doesn't really eat dinner-just a 1/2 boost.    Past Medical History:  Diagnosis Date   Allergy    Arthritis    Colon cancer ([place])    colon ca dx 07/30/09   History of cardiac monitoring 07/2017   "Event monitor demonstrated sinus rhythm with isolated PACs and no arrhythmias"   History of colon cancer 06/2009   found at time of TCS 06/29/09, 1.2cm sessile cecal polyp, no adjuvent therapy needed   HTN (hypertension)    Hx of cardiovascular stress test 07/2017   "No diagnostic ST segment changes to indicate ischemia. Small, moderate intensity, reversible apical to basal inferolateral defect consistent with ischemia. This is a low risk study. Nuclear stress EF: 84%."   Hyperlipidemia    Hypothyroidism    PE (pulmonary thromboembolism) ([place])    Renal disorder    cyst on kidney    Stroke [facility])    Vertigo     Past Surgical History:  Procedure Laterality Date   ABDOMINAL HYSTERECTOMY     COLON SURGERY  07/2009    right hemicolectomy, no residual colon cancer on path   COLONOSCOPY  07/16/2010   AJG:OTLXBWIOMBTD POLYP-TCS 3 YEARS   COLONOSCOPY N/A 08/02/2013   hyperplastic polyps, surveillance in 2020 if benefits outweight the risks   COLONOSCOPY  06/2009   1.2 cm sessile cecal polyp which had adenocarcinoma arising in a tubular adenoma.   PARTIAL THYMECTOMY     partial thyroidectomy     benign tumors    Current Medications: Current Meds  Medication Sig   acetaminophen (TYLENOL) 500 MG tablet Take 1,000 mg by mouth every 8 (eight) hours as needed for mild pain or headache.    amLODipine (NORVASC) 5 MG tablet Take 1 tablet (5 mg total) by mouth daily.   ASPERCREME LIDOCAINE EX Apply 1 application topically daily as needed (for knee pain).    aspirin 325 MG EC tablet Take 325 mg by mouth daily.   atorvastatin (LIPITOR) 40 MG tablet TAKE 2 TABLETS BY MOUTH ONCE A DAY. (Patient taking differently: Take 80 mg by mouth every evening. )   cholecalciferol (VITAMIN D3) 25 MCG (1000 UT) tablet Take 1,000 Units by mouth daily.    fluocinonide cream (LIDEX) 0.05 % Apply topically 2 (two) times daily as needed.   gabapentin (NEURONTIN) 300 MG capsule Take 1 in the morning and 1 in the evening (Patient taking differently: Take  300 mg by mouth 2 (two) times daily. Take 1 in the morning and 1 in the evening)   levETIRAcetam (KEPPRA) 250 MG tablet Take 1 tablet (250 mg total) by mouth 2 (two) times daily. (Patient taking differently: Take 125 mg by mouth 2 (two) times daily. Patient can only tolerate 1/2 tab)   linaclotide (LINZESS) 72 MCG capsule Take 1 capsule (72 mcg total) by mouth daily as needed (for constipation).   loratadine (CLARITIN) 10 MG tablet Take 10 mg by mouth daily.    metoprolol tartrate (LOPRESSOR) 25 MG tablet Take 1.5 tablets (37.5 mg total) by mouth 2 (two) times daily.   RESTASIS 0.05 % ophthalmic emulsion Place 1 drop into both eyes 2 (two) times daily as needed (chronic dry  eye).    traMADol (ULTRAM) 50 MG tablet Take 25 mg by mouth every 8 (eight) hours as needed for moderate pain.    [DISCONTINUED] aspirin 325 MG EC tablet Take 162.5-325 mg by mouth daily.     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   Occupation: retired from [employer]  Tobacco Use   Smoking status: Never Smoker   Smokeless tobacco: Never Used  Scientific laboratory technician Use: Never used  Substance and Sexual Activity   Alcohol use: No    Alcohol/week: 0.0 standard drinks   Drug use: No   Sexual activity: Not Currently  Other Topics Concern   Not on file  Social History Narrative   Not on file   Social Determinants of Health   Financial Resource Strain:    Difficulty of Paying Living Expenses: Not on file  Food Insecurity:    Worried About Char[employer] fundraiser in the Last Year: Not on file   Staples in the Last Year: Not on file  Transportation Needs:    Lack of Transportation (Medical): Not on file   Lack of Transportation (Non-Medical): Not on file  Physical Activity:    Days of Exercise per Week: Not on file   Minutes of Exercise per Session: Not on file  Stress:    Feeling of Stress : Not on file  Social Connections:    Frequency of Communication with Friends and Family: Not on file   Frequency of Social Gatherings with Friends and Family: Not on file   Attends Religious Services: Not on file   Active Member of Clubs or Organizations: Not on file   Attends Archivist Meetings: Not on file   Marital Status: Not on file     Family History:  The patient's family history includes Arthritis in her mother; Cancer in her mother; Colon cancer in her mother; Diabetes in her maternal aunt;  Heart attack in her father; Heart disease in her father and mother; Hyperlipidemia in her daughter and mother; Hypertension in her daughter and mother.   ROS:   Please see the history of present illness.    ROS All other systems reviewed and are negative.   PHYSICAL EXAM:   VS:  BP 126/68    Pulse 70    Ht 5\' 4"  (1.626 m)    Wt 163 lb (73.9 kg)    SpO2  97%    BMI 27.98 kg/m   Physical Exam  GEN: Well nourished, well developed, in no acute distress  Neck: no JVD, carotid bruits, or masses Cardiac:RRR; no murmurs, rubs, or gallops  Respiratory:  clear to auscultation bilaterally, normal work of breathing GI: soft, nontender, nondistended, + BS Ext: without cyanosis, clubbing, or edema, Good distal pulses bilaterally MS: no deformity or atrophy  Skin: warm and dry, no rash Neuro:  Alert and Oriented x 3, Strength and sensation are intact Psych: euthymic mood, full affect  Wt Readings from Last 3 Encounters:  01/21/20 163 lb (73.9 kg)  12/05/19 161 lb 1.6 oz (73.1 kg)  10/30/19 174 lb 9.7 oz (79.2 kg)      Studies/Labs Reviewed:   EKG:  EKG is not ordered today Recent Labs: 09/20/2019: TSH 0.36 12/03/2019: ALT 20; BUN 20; Creatinine, Ser 1.38; Hemoglobin 13.9; Platelets 282; Potassium 3.7; Sodium 139   Lipid Panel    Component Value Date/Time   CHOL 130 09/20/2019 1605   TRIG 107 09/20/2019 1605   HDL 46 (L) 09/20/2019 1605   CHOLHDL 2.8 09/20/2019 1605   VLDL 8 03/01/2019 0629   LDLCALC 65 09/20/2019 1605    Additional studies/ records that were reviewed today include:   Event monitor 09/09/2019 ZIO XT reviewed. 13 days 15 hours analyzed. Predominant rhythm is sinus with heart rate ranging from 52 bpm up to 81 bpm and average heart rate 64 bpm. There were rare PACs and PVCs representing less than 1% of total beats, this included couplets and triplets. Multiple, brief episodes of SVT were noted with peak heart rate into the 150s. The longest episode was 11.9 seconds. There  were no pauses     Echocardiogram: 03/01/2019     1. Left ventricular ejection fraction, by visual estimation, is 70 to  75%. The left ventricle has hyperdynamic function. There is mildly  increased left ventricular hypertrophy.   2. Left ventricular diastolic parameters are consistent with Grade I  diastolic dysfunction (impaired relaxation).   3. The left ventricle has no regional wall motion abnormalities.   4. Global right ventricle has normal systolic function.The right  ventricular size is normal. No increase in right ventricular wall  thickness.   5. Left atrial size was normal.   6. Right atrial size was normal.   7. Mild mitral annular calcification.   8. The mitral valve is abnormal. Systolic bowing with possible subleaflet  prolapse involving posterior leaflet. Trivial mitral valve regurgitation.   9. The tricuspid valve is grossly normal. Tricuspid valve regurgitation  is mild.  10. The aortic valve is grossly normal. Aortic valve regurgitation is not  visualized.  11. The pulmonic valve was grossly normal. Pulmonic valve regurgitation is  trivial.  12. Moderately elevated pulmonary artery systolic pressure.  13. The tricuspid regurgitant velocity is 2.91 m/s, and with an assumed  right atrial pressure of 10 mmHg, the estimated right ventricular systolic  pressure is moderately elevated at 43.9 mmHg.      Cardiac Event Monitor: 07/2017  Sinus rhythm with isolated PACs. No arrhythmias.   Carotid Dopplers: 08/20/2018 IMPRESSION: 1. Mild bilateral carotid bifurcation plaque resulting in less than 50% diameter stenosis. 2.  Antegrade bilateral vertebral arterial flow.         ASSESSMENT:    1. Palpitations   2. Essential hypertension   3. Mixed hyperlipidemia   4. Weakness   5. History of pulmonary embolus (PE)      PLAN:  In order  of problems listed above:  Palpitations with PACs and PVCs, monitor 09/09/2019 brief episodes of SVT peak heart rate 150  bpm longest episode 11.9 seconds no pauses.  Metoprolol increased to 50 mg twice daily if she was still having symptoms but patient taking 37.5 mg BID with an extra 25 mg about twice weekly.  I think this is reasonable.  Would not want to slow her heart rate down too much.  Daughter will check her pulse and blood pressure more frequently and bring it in next visit.  Hypertension blood pressure controlled.  Hyperlipidemia on atorvastatin managed by PCP  Weakness and nausea every evening about 630 has been going on for over a year.  Blood work has been unrevealing.  She does not really eat dinner.  I asked her daughter to check her blood pressure and pulse during these episodes and call us with the readings.  I also asked her to try to eat a little bit of dinner in the evening to see if this does not help.  Unprovoked Pulmonary embolus 04/26/18 followed by intracranial hemorrhage on eliquis which was stopped and she's now on ASA 325 mg daily.  Medication Adjustments/Labs and Tests Ordered: Current medicines are reviewed at length with the patient today.  Concerns regarding medicines are outlined above.  Medication changes, Labs and Tests ordered today are listed in the Patient Instructions below. Patient Instructions  Medication Instructions:  Your physician recommends that you continue on your current medications as directed. Please refer to the Current Medication list given to you today.  *If you need a refill on your cardiac medications before your next appointment, please call your pharmacy*   Lab Work: None today If you have labs (blood work) drawn today and your tests are completely normal, you will receive your results only by:  [place] (if you have MyChart) OR  A paper copy in the mail If you have any lab test that is abnormal or we need to change your treatment, we will call you to review the results.   Testing/Procedures: None today   Follow-Up: At [facility], you  and your health needs are our priority.  As part of our continuing mission to provide you with exceptional heart care, we have created designated Provider Care Teams.  These Care Teams include your primary Cardiologist (physician) and Advanced Practice Providers (APPs -  Physician Assistants and Nurse Practitioners) who all work together to provide you with the care you need, when you need it.  We recommend signing up for the patient portal called "MyChart".  Sign up information is provided on this After Visit Summary.  MyChart is used to connect with patients for Virtual Visits (Telemedicine).  Patients are able to view lab/test results, encounter notes, upcoming appointments, etc.  Non-urgent messages can be sent to your provider as well.   To learn more about what you can do with MyChart, go to [email or web address].    Your next appointment:   5 month(s)  The format for your next appointment:   In Person  Provider:   Carlyle Dolly, MD   Other Instructions  Call us with HR/BP       Thank you for choosing [facility] !            Sumner Boast, PA-C  01/21/2020 2:55 PM    [facility] [address], [place], [place]  [address] Phone: [phone]; Fax: [phone]

## 2020-01-21 ENCOUNTER — Ambulatory Visit: Payer: Medicare Other | Admitting: Physician Assistant

## 2020-01-21 ENCOUNTER — Encounter: Payer: Self-pay | Admitting: Physician Assistant

## 2020-01-21 ENCOUNTER — Other Ambulatory Visit: Payer: Self-pay

## 2020-01-21 VITALS — BP 126/68 | HR 70 | Ht 64.0 in | Wt 163.0 lb

## 2020-01-21 DIAGNOSIS — E782 Mixed hyperlipidemia: Secondary | ICD-10-CM | POA: Diagnosis not present

## 2020-01-21 DIAGNOSIS — R531 Weakness: Secondary | ICD-10-CM

## 2020-01-21 DIAGNOSIS — R002 Palpitations: Secondary | ICD-10-CM | POA: Diagnosis not present

## 2020-01-21 DIAGNOSIS — Z86711 Personal history of pulmonary embolism: Secondary | ICD-10-CM

## 2020-01-21 DIAGNOSIS — I10 Essential (primary) hypertension: Secondary | ICD-10-CM | POA: Diagnosis not present

## 2020-01-21 NOTE — Patient Instructions (Signed)
Medication Instructions:  Your physician recommends that you continue on your current medications as directed. Please refer to the Current Medication list given to you today.  *If you need a refill on your cardiac medications before your next appointment, please call your pharmacy*   Lab Work: None today If you have labs (blood work) drawn today and your tests are completely normal, you will receive your results only by:  [place] (if you have MyChart) OR  A paper copy in the mail If you have any lab test that is abnormal or we need to change your treatment, we will call you to review the results.   Testing/Procedures: None today   Follow-Up: At [facility], you and your health needs are our priority.  As part of our continuing mission to provide you with exceptional heart care, we have created designated Provider Care Teams.  These Care Teams include your primary Cardiologist (physician) and Advanced Practice Providers (APPs -  Physician Assistants and Nurse Practitioners) who all work together to provide you with the care you need, when you need it.  We recommend signing up for the patient portal called "MyChart".  Sign up information is provided on this After Visit Summary.  MyChart is used to connect with patients for Virtual Visits (Telemedicine).  Patients are able to view lab/test results, encounter notes, upcoming appointments, etc.  Non-urgent messages can be sent to your provider as well.   To learn more about what you can do with MyChart, go to [email or web address].    Your next appointment:   5 month(s)  The format for your next appointment:   In Person  Provider:   Carlyle Dolly, MD   Other Instructions  Call us with HR/BP       Thank you for choosing [facility] !

## 2020-01-27 ENCOUNTER — Other Ambulatory Visit (HOSPITAL_COMMUNITY): Payer: Self-pay

## 2020-01-27 DIAGNOSIS — Z86711 Personal history of pulmonary embolism: Secondary | ICD-10-CM

## 2020-01-27 DIAGNOSIS — C188 Malignant neoplasm of overlapping sites of colon: Secondary | ICD-10-CM

## 2020-01-28 ENCOUNTER — Other Ambulatory Visit: Payer: Self-pay

## 2020-01-28 ENCOUNTER — Inpatient Hospital Stay (HOSPITAL_COMMUNITY): Payer: Medicare Other | Attending: Hematology

## 2020-01-28 DIAGNOSIS — I2699 Other pulmonary embolism without acute cor pulmonale: Secondary | ICD-10-CM | POA: Insufficient documentation

## 2020-01-28 DIAGNOSIS — C188 Malignant neoplasm of overlapping sites of colon: Secondary | ICD-10-CM

## 2020-01-28 DIAGNOSIS — Z7982 Long term (current) use of aspirin: Secondary | ICD-10-CM | POA: Diagnosis not present

## 2020-01-28 DIAGNOSIS — Z85038 Personal history of other malignant neoplasm of large intestine: Secondary | ICD-10-CM | POA: Diagnosis not present

## 2020-01-28 DIAGNOSIS — Z86711 Personal history of pulmonary embolism: Secondary | ICD-10-CM

## 2020-01-28 LAB — CBC WITH DIFFERENTIAL/PLATELET
Abs Immature Granulocytes: 0.01 10*3/uL (ref 0.00–0.07)
Basophils Absolute: 0 10*3/uL (ref 0.0–0.1)
Basophils Relative: 1 %
Eosinophils Absolute: 0.3 10*3/uL (ref 0.0–0.5)
Eosinophils Relative: 7 %
HCT: 43.2 % (ref 36.0–46.0)
Hemoglobin: 13.4 g/dL (ref 12.0–15.0)
Immature Granulocytes: 0 %
Lymphocytes Relative: 35 %
Lymphs Abs: 1.3 10*3/uL (ref 0.7–4.0)
MCH: 30.7 pg (ref 26.0–34.0)
MCHC: 31 g/dL (ref 30.0–36.0)
MCV: 98.9 fL (ref 80.0–100.0)
Monocytes Absolute: 0.4 10*3/uL (ref 0.1–1.0)
Monocytes Relative: 10 %
Neutro Abs: 1.7 10*3/uL (ref 1.7–7.7)
Neutrophils Relative %: 47 %
Platelets: 272 10*3/uL (ref 150–400)
RBC: 4.37 MIL/uL (ref 3.87–5.11)
RDW: 14.7 % (ref 11.5–15.5)
WBC: 3.6 10*3/uL — ABNORMAL LOW (ref 4.0–10.5)
nRBC: 0 % (ref 0.0–0.2)

## 2020-01-28 LAB — COMPREHENSIVE METABOLIC PANEL
ALT: 24 U/L (ref 0–44)
AST: 24 U/L (ref 15–41)
Albumin: 3.7 g/dL (ref 3.5–5.0)
Alkaline Phosphatase: 108 U/L (ref 38–126)
Anion gap: 8 (ref 5–15)
BUN: 17 mg/dL (ref 8–23)
CO2: 29 mmol/L (ref 22–32)
Calcium: 9.3 mg/dL (ref 8.9–10.3)
Chloride: 103 mmol/L (ref 98–111)
Creatinine, Ser: 1.38 mg/dL — ABNORMAL HIGH (ref 0.44–1.00)
GFR, Estimated: 38 mL/min — ABNORMAL LOW (ref >60)
Glucose, Bld: 95 mg/dL (ref 70–99)
Potassium: 3.5 mmol/L (ref 3.5–5.1)
Sodium: 140 mmol/L (ref 135–145)
Total Bilirubin: 0.7 mg/dL (ref 0.3–1.2)
Total Protein: 7.5 g/dL (ref 6.5–8.1)

## 2020-01-28 LAB — LACTATE DEHYDROGENASE: LDH: 152 U/L (ref 98–192)

## 2020-01-28 LAB — D-DIMER, QUANTITATIVE: D-Dimer, Quant: 3 ug/mL-FEU — ABNORMAL HIGH (ref 0.00–0.50)

## 2020-02-04 ENCOUNTER — Other Ambulatory Visit: Payer: Self-pay

## 2020-02-04 ENCOUNTER — Inpatient Hospital Stay (HOSPITAL_COMMUNITY): Payer: Medicare Other | Admitting: Hematology

## 2020-02-04 VITALS — BP 142/71 | HR 73 | Temp 97.4°F | Resp 18 | Wt 167.3 lb

## 2020-02-04 DIAGNOSIS — I2699 Other pulmonary embolism without acute cor pulmonale: Secondary | ICD-10-CM | POA: Diagnosis not present

## 2020-02-04 DIAGNOSIS — Z85038 Personal history of other malignant neoplasm of large intestine: Secondary | ICD-10-CM | POA: Diagnosis not present

## 2020-02-04 DIAGNOSIS — C188 Malignant neoplasm of overlapping sites of colon: Secondary | ICD-10-CM

## 2020-02-04 DIAGNOSIS — Z7982 Long term (current) use of aspirin: Secondary | ICD-10-CM | POA: Diagnosis not present

## 2020-02-04 NOTE — Progress Notes (Signed)
[place], [place] [address]   CLINIC:  Medical Oncology/Hematology  PCP:  Alycia Rossetti, [place] / [place] [address]  [phone]  REASON FOR VISIT:  Follow-up for PE and colon cancer  PRIOR THERAPY:  1. Right colon segmental resection on 07/31/2009. 2. Eliquis from 04/2018 to 11/25/2018.  CURRENT THERAPY: Aspirin 325 mg QD  INTERVAL HISTORY:  [name], a 85 y.o. female, returns for routine follow-up for her PE and colon cancer. [name] was last seen on 04/23/2019.  Today she is accompanied by her daughter and she reports feeling well. She denies having any new muscle pains aside from the arthritis in her knees.   REVIEW OF SYSTEMS:  Review of Systems  Constitutional: Positive for fatigue (75%). Negative for appetite change.  Respiratory: Positive for shortness of breath.   Cardiovascular: Positive for palpitations.  Gastrointestinal: Positive for constipation.  Musculoskeletal: Positive for arthralgias (arthritis in knees). Negative for myalgias.  Neurological: Positive for headaches.  All other systems reviewed and are negative.   PAST MEDICAL/SURGICAL HISTORY:  Past Medical History:  Diagnosis Date  . Allergy   . Arthritis   . Colon cancer ([place])    colon ca dx 07/30/09  . History of cardiac monitoring 07/2017   "Event monitor demonstrated sinus rhythm with isolated PACs and no arrhythmias"  . History of colon cancer 06/2009   found at time of TCS 06/29/09, 1.2cm sessile cecal polyp, no adjuvent therapy needed  . HTN (hypertension)   . Hx of cardiovascular stress test 07/2017   "No diagnostic ST segment changes to indicate ischemia. Small, moderate intensity, reversible apical to basal inferolateral defect consistent with ischemia. This is a low risk study. Nuclear stress EF: 84%."  . Hyperlipidemia   . Hypothyroidism   . PE (pulmonary thromboembolism) ([facility])   . Renal disorder    cyst  on kidney   . Stroke ([facility] [facility])   . Vertigo    Past Surgical History:  Procedure Laterality Date  . ABDOMINAL HYSTERECTOMY    . COLON SURGERY  07/2009   right hemicolectomy, no residual colon cancer on path  . COLONOSCOPY  07/16/2010   YDX:AJOINOM[facility] POLYP-TCS 3 YEARS  . COLONOSCOPY N/A 08/02/2013   hyperplastic polyps, surveillance in 2020 if benefits outweight the risks  . COLONOSCOPY  06/2009   1.2 cm sessile cecal polyp which had adenocarcinoma arising in a tubular adenoma.  Marland Kitchen PARTIAL THYMECTOMY    . partial thyroidectomy     benign tumors    SOCIAL HISTORY:  Social History   Socioeconomic History  . Marital status: Widowed    Spouse name: Not on file  . Number of children: 2  . Years of education: Not on file  . Highest education level: Not on file  Occupational History  . Occupation: Psychologist, occupational at [employer]  . Occupation: retired from [employer]  Tobacco Use  . Smoking status: Never Smoker  . Smokeless tobacco: Never Used  Vaping Use  . Vaping Use: Never used  Substance and Sexual Activity  . Alcohol use: No    Alcohol/week: 0.0 standard drinks  . Drug use: No  . Sexual activity: Not Currently  Other Topics Concern  . Not on file  Social History Narrative  . Not on file   Social Determinants of Health   Financial Resource Strain:   . Difficulty of Paying Living Expenses: Not on file  Food  Insecurity:   . Worried About Char[employer] fundraiser in the Last Year: Not on file  . Ran Out of Food in the Last Year: Not on file  Transportation Needs:   . Lack of Transportation (Medical): Not on file  . Lack of Transportation (Non-Medical): Not on file  Physical Activity:   . Days of Exercise per Week: Not on file  . Minutes of Exercise per Session: Not on file  Stress:   . Feeling of Stress : Not on file  Social Connections:   . Frequency of Communication with Friends and Family: Not on file  . Frequency of Social Gatherings with Friends and Family:  Not on file  . Attends Religious Services: Not on file  . Active Member of Clubs or Organizations: Not on file  . Attends Archivist Meetings: Not on file  . Marital Status: Not on file  Intimate Partner Violence:   . Fear of Current or Ex-Partner: Not on file  . Emotionally Abused: Not on file  . Physically Abused: Not on file  . Sexually Abused: Not on file    FAMILY HISTORY:  Family History  Problem Relation Age of Onset  . Colon cancer Mother        >age60  . Arthritis Mother   . Cancer Mother   . Heart disease Mother   . Hyperlipidemia Mother   . Hypertension Mother   . Heart attack Father   . Heart disease Father   . Diabetes Maternal Aunt   . Hyperlipidemia Daughter   . Hypertension Daughter   . Liver disease Neg Hx     CURRENT MEDICATIONS:  Current Outpatient Medications  Medication Sig Dispense Refill  . acetaminophen (TYLENOL) 500 MG tablet Take 1,000 mg by mouth every 8 (eight) hours as needed for mild pain or headache.     Marland Kitchen amLODipine (NORVASC) 5 MG tablet Take 1 tablet (5 mg total) by mouth daily. 90 tablet 3  . ASPERCREME LIDOCAINE EX Apply 1 application topically daily as needed (for knee pain).     Marland Kitchen aspirin 325 MG EC tablet Take 325 mg by mouth daily.    Marland Kitchen atorvastatin (LIPITOR) 40 MG tablet TAKE 2 TABLETS BY MOUTH ONCE A DAY. (Patient taking differently: Take 80 mg by mouth every evening. ) 180 tablet 2  . cholecalciferol (VITAMIN D3) 25 MCG (1000 UT) tablet Take 1,000 Units by mouth daily.     . fluocinonide cream (LIDEX) 0.05 % Apply topically 2 (two) times daily as needed.    . gabapentin (NEURONTIN) 300 MG capsule Take 1 in the morning and 1 in the evening (Patient taking differently: Take 300 mg by mouth 2 (two) times daily. Take 1 in the morning and 1 in the evening)    . levETIRAcetam (KEPPRA) 250 MG tablet Take 1 tablet (250 mg total) by mouth 2 (two) times daily. (Patient taking differently: Take 125 mg by mouth 2 (two) times daily.  Patient can only tolerate 1/2 tab) 60 tablet 1  . linaclotide (LINZESS) 72 MCG capsule Take 1 capsule (72 mcg total) by mouth daily as needed (for constipation). 90 capsule 2  . loratadine (CLARITIN) 10 MG tablet Take 10 mg by mouth daily.     . metoprolol tartrate (LOPRESSOR) 25 MG tablet Take 1.5 tablets (37.5 mg total) by mouth 2 (two) times daily. 270 tablet 0  . RESTASIS 0.05 % ophthalmic emulsion Place 1 drop into both eyes 2 (two) times daily as needed (chronic dry  eye).     . traMADol (ULTRAM) 50 MG tablet Take 25 mg by mouth every 8 (eight) hours as needed for moderate pain.      No current facility-administered medications for this visit.    ALLERGIES:  Allergies  Allergen Reactions  . Tape Rash    Zio monitor adhesive causes ulcerated and infected skin .Had to see derm    PHYSICAL EXAM:  Performance status (ECOG): 2 - Symptomatic, <50% confined to bed  Vitals:   02/04/20 1453  BP: (!) 142/71  Pulse: 73  Resp: 18  Temp: (!) 97.4 F (36.3 C)  SpO2: 98%   Wt Readings from Last 3 Encounters:  02/04/20 167 lb 4.8 oz (75.9 kg)  01/21/20 163 lb (73.9 kg)  12/05/19 161 lb 1.6 oz (73.1 kg)   Physical Exam Vitals reviewed.  Constitutional:      Appearance: Normal appearance.  Cardiovascular:     Rate and Rhythm: Normal rate and regular rhythm.     Pulses: Normal pulses.     Heart sounds: Normal heart sounds.  Pulmonary:     Effort: Pulmonary effort is normal.     Breath sounds: Normal breath sounds.  Musculoskeletal:     Right lower leg: No edema.     Left lower leg: No edema.  Neurological:     General: No focal deficit present.     Mental Status: She is alert and oriented to person, place, and time.  Psychiatric:        Mood and Affect: Mood normal.        Behavior: Behavior normal.     LABORATORY DATA:  I have reviewed the labs as listed.  CBC Latest Ref Rng & Units 01/28/2020 12/03/2019 10/21/2019  WBC 4.0 - 10.5 K/uL 3.6(L) 5.8 5.1  Hemoglobin 12.0 -  15.0 g/dL 13.4 13.9 13.6  Hematocrit 36 - 46 % 43.2 44.0 43.3  Platelets 150 - 400 K/uL 272 282 258   CMP Latest Ref Rng & Units 01/28/2020 12/03/2019 10/21/2019  Glucose 70 - 99 mg/dL 95 105(H) 94  BUN 8 - 23 mg/dL 17 20 18   Creatinine 0.44 - 1.00 mg/dL 1.38(H) 1.38(H) 1.38(H)  Sodium 135 - 145 mmol/L 140 139 138  Potassium 3.5 - 5.1 mmol/L 3.5 3.7 4.0  Chloride 98 - 111 mmol/L 103 102 105  CO2 22 - 32 mmol/L 29 28 26   Calcium 8.9 - 10.3 mg/dL 9.3 9.3 9.2  Total Protein 6.5 - 8.1 g/dL 7.5 7.8 7.8  Total Bilirubin 0.3 - 1.2 mg/dL 0.7 0.9 0.9  Alkaline Phos 38 - 126 U/L 108 105 107  AST 15 - 41 U/L 24 19 19   ALT 0 - 44 U/L 24 20 19       Component Value Date/Time   RBC 4.37 01/28/2020 1110   MCV 98.9 01/28/2020 1110   MCV 93.8 04/16/2015 0812   MCH 30.7 01/28/2020 1110   MCHC 31.0 01/28/2020 1110   RDW 14.7 01/28/2020 1110   RDW 15.0 (H) 04/16/2015 0812   LYMPHSABS 1.3 01/28/2020 1110   LYMPHSABS 1.9 04/16/2015 0812   MONOABS 0.4 01/28/2020 1110   MONOABS 0.4 04/16/2015 0812   EOSABS 0.3 01/28/2020 1110   EOSABS 0.2 04/16/2015 0812   BASOSABS 0.0 01/28/2020 1110   BASOSABS 0.1 04/16/2015 0812   Lab Results  Component Value Date   LDH 152 01/28/2020   LDH 149 05/15/2018    DIAGNOSTIC IMAGING:  I have independently reviewed the scans and discussed with the patient.  No results found.   ASSESSMENT:  1.  Unprovoked pulmonary embolism: -CT PE protocol on 04/26/2018 showed bilateral pulmonary emboli. -She was treated with Eliquis until her hospitalization on 11/25/2018 when she was diagnosed with a 4 mm frontal hemorrhage. -Eliquis was discontinued.  She started taking aspirin 325 mg in December 2020. -Because of her high risk of bleeding, we will continue aspirin 325 mg at this time for thromboprophylaxis. -We reviewed her labs.  Her D-dimer is elevated but stable at 1.1.  Previously this was 1.2 in November 2020. -We will reassess her in 6 months with repeat labs.  2.   Colon cancer: -Cecal polypectomy on 06/29/2009 with adenocarcinoma arising in a tubular adenoma. -Right colon segmental resection with 0/25 lymph nodes positive and no residual cancer on 07/31/2009. -Last colonoscopy on 08/02/2013 showed hyperplastic polyp in the rectum and sigmoid colon. -CT of the abdomen and pelvis on 05/28/2018 did not show any evidence of malignancy.  She did not have any significant weight loss.   PLAN:  1.  Unprovoked pulmonary embolism: -She does not report any leg swellings, pain, breathing difficulty or pleuritic chest pain. -We have reviewed her labs from 01/28/2020.  D-dimer is elevated at 3.0. -Reviewed all the signs and symptoms of recurrent DVT and PE and have asked her to go to the ER if any symptoms develop. -RTC 6 months for follow-up with repeat labs.    Orders placed this encounter:  No orders of the defined types were placed in this encounter.    Derek Jack, MD [place] [phone]   I, Milinda Antis, am acting as a scribe for Dr. Sanda Linger.  I, Derek Jack MD, have reviewed the above documentation for accuracy and completeness, and I agree with the above.

## 2020-02-04 NOTE — Patient Instructions (Signed)
[place] at [facility] Discharge Instructions  You were seen today by Dr. Delton Coombes. He went over your recent results and scans. If you develop leg swelling, difficulty breathing, chest pain or chest pain with deep breathing, go the the ED immediately. Dr. Delton Coombes will see you back in 6 months for labs and follow up.   Thank you for choosing [facility] to provide your oncology and hematology care.  To afford each patient quality time with our provider, please arrive at least 15 minutes before your scheduled appointment time.   If you have a lab appointment with the [facility] please come in thru the Main Entrance and check in at the main information desk  You need to re-schedule your appointment should you arrive 10 or more minutes late.  We strive to give you quality time with our providers, and arriving late affects you and other patients whose appointments are after yours.  Also, if you no show three or more times for appointments you may be dismissed from the clinic at the providers discretion.     Again, thank you for choosing [facility].  Our hope is that these requests will decrease the amount of time that you wait before being seen by our physicians.       _____________________________________________________________  Should you have questions after your visit to [facility], please contact our office at [phone] between the hours of 8:00 a.m. and 4:30 p.m.  Voicemails left after 4:00 p.m. will not be returned until the following business day.  For prescription refill requests, have your pharmacy contact our office and allow 72 hours.    Cancer Center Support Programs:   > Cancer Support Group  2nd Tuesday of the month 1pm-2pm, Journey Room

## 2020-02-11 ENCOUNTER — Other Ambulatory Visit: Payer: Self-pay | Admitting: Cardiology

## 2020-02-12 ENCOUNTER — Telehealth: Payer: Self-pay | Admitting: Cardiology

## 2020-02-12 MED ORDER — METOPROLOL TARTRATE 25 MG PO TABS
ORAL_TABLET | ORAL | 1 refills | Status: DC
Start: 2020-02-12 — End: 2020-04-10

## 2020-02-12 NOTE — Telephone Encounter (Signed)
New messsage      *STAT* If patient is at the pharmacy, call can be transferred to refill team.   Patient is taking approximately 18 pills over what her prescription is written for when she is having palpitations - she needs a 2nd prescription called in for that because pharmacy will not give her extra and insurance will not cover for her to take more of her regular prescriptons  1. Which medications need to be refilled? (please list name of each medication and dose if known) metoprolol tartrate (LOPRESSOR) 25 MG tablet  2. Which pharmacy/location (including street and city if local pharmacy) is medication to be sent to?  [facility]   3. Do they need a 30 day or 90 day supply? [place]

## 2020-02-12 NOTE — Telephone Encounter (Signed)
Sent to [facility] 

## 2020-03-04 DIAGNOSIS — G890 Central pain syndrome: Secondary | ICD-10-CM | POA: Diagnosis not present

## 2020-03-04 DIAGNOSIS — Z79891 Long term (current) use of opiate analgesic: Secondary | ICD-10-CM | POA: Diagnosis not present

## 2020-03-04 DIAGNOSIS — I6930 Unspecified sequelae of cerebral infarction: Secondary | ICD-10-CM | POA: Diagnosis not present

## 2020-04-07 DIAGNOSIS — D631 Anemia in chronic kidney disease: Secondary | ICD-10-CM | POA: Diagnosis not present

## 2020-04-07 DIAGNOSIS — N1831 Chronic kidney disease, stage 3a: Secondary | ICD-10-CM | POA: Diagnosis not present

## 2020-04-07 DIAGNOSIS — E559 Vitamin D deficiency, unspecified: Secondary | ICD-10-CM | POA: Diagnosis not present

## 2020-04-07 DIAGNOSIS — Z79899 Other long term (current) drug therapy: Secondary | ICD-10-CM | POA: Diagnosis not present

## 2020-04-07 DIAGNOSIS — R809 Proteinuria, unspecified: Secondary | ICD-10-CM | POA: Diagnosis not present

## 2020-04-10 ENCOUNTER — Other Ambulatory Visit: Payer: Self-pay

## 2020-04-10 ENCOUNTER — Encounter: Payer: Self-pay | Admitting: Family Medicine

## 2020-04-10 ENCOUNTER — Other Ambulatory Visit (HOSPITAL_COMMUNITY): Payer: Self-pay | Admitting: Nephrology

## 2020-04-10 ENCOUNTER — Ambulatory Visit (INDEPENDENT_AMBULATORY_CARE_PROVIDER_SITE_OTHER): Payer: Medicare Other | Admitting: Family Medicine

## 2020-04-10 VITALS — BP 132/64 | HR 67 | Temp 97.1°F | Ht 63.0 in | Wt 161.0 lb

## 2020-04-10 DIAGNOSIS — I493 Ventricular premature depolarization: Secondary | ICD-10-CM

## 2020-04-10 DIAGNOSIS — N1832 Chronic kidney disease, stage 3b: Secondary | ICD-10-CM

## 2020-04-10 DIAGNOSIS — E1122 Type 2 diabetes mellitus with diabetic chronic kidney disease: Secondary | ICD-10-CM | POA: Diagnosis not present

## 2020-04-10 DIAGNOSIS — N281 Cyst of kidney, acquired: Secondary | ICD-10-CM | POA: Diagnosis not present

## 2020-04-10 DIAGNOSIS — I5032 Chronic diastolic (congestive) heart failure: Secondary | ICD-10-CM | POA: Diagnosis not present

## 2020-04-10 DIAGNOSIS — E870 Hyperosmolality and hypernatremia: Secondary | ICD-10-CM | POA: Diagnosis not present

## 2020-04-10 DIAGNOSIS — I129 Hypertensive chronic kidney disease with stage 1 through stage 4 chronic kidney disease, or unspecified chronic kidney disease: Secondary | ICD-10-CM | POA: Diagnosis not present

## 2020-04-10 DIAGNOSIS — N189 Chronic kidney disease, unspecified: Secondary | ICD-10-CM | POA: Diagnosis not present

## 2020-04-10 DIAGNOSIS — E119 Type 2 diabetes mellitus without complications: Secondary | ICD-10-CM | POA: Diagnosis not present

## 2020-04-10 DIAGNOSIS — E782 Mixed hyperlipidemia: Secondary | ICD-10-CM

## 2020-04-10 DIAGNOSIS — I10 Essential (primary) hypertension: Secondary | ICD-10-CM | POA: Diagnosis not present

## 2020-04-10 MED ORDER — METOPROLOL TARTRATE 25 MG PO TABS
ORAL_TABLET | ORAL | 2 refills | Status: DC
Start: 2020-04-10 — End: 2020-06-23

## 2020-04-10 NOTE — Patient Instructions (Addendum)
Try eating during the episode Move the lipitor to lunch time

## 2020-04-10 NOTE — Assessment & Plan Note (Signed)
Diet controlled for years Check A1C yearly  She had labs done for nephrology will obtain those results

## 2020-04-10 NOTE — Progress Notes (Signed)
   Subjective:    Patient ID: [name], female    DOB: 1934-09-11, 86 y.o.   MRN: [id number]  Patient presents for Hypertension   Pt here to f/u chronic medical problems  has not felt well past couple of days   140's /66 Took extra 1/2 tablet of metoprolol  She still feels fatigued in the evening between 4:30-6pm, this has been ongoing for months  she checks her pulse and oxygen sat these have been normal  After laying down she feels well She is sleeping good at night   Followed by Neurology  She is still on Keppra 1/2 tab BID Headaches improve ultram    Constipation taking linzess   She has renal appt today at  2pm  History of PE- she is on ASA Followed by hematology  D dimer remains a little elevated     Review Of Systems:  GEN- + fatigue, Denies  fever, weight loss,weakness, recent illness HEENT- denies eye drainage, change in vision, nasal discharge, CVS- denies chest pain, palpitations RESP- denies SOB, cough, wheeze ABD- denies N/V, change in stools, abd pain GU- denies dysuria, hematuria, dribbling, incontinence MSK- denies joint pain, muscle aches, injury Neuro- denies headache, dizziness, syncope, seizure activity       Objective:    BP 132/64   Pulse 67   Temp (!) 97.1 F (36.2 C)   Ht 5\' 3"  (1.6 m)   Wt 161 lb (73 kg)   SpO2 98%   BMI 28.52 kg/m  GEN- NAD, alert and oriented x3 HEENT- PERRL, EOMI, non injected sclera, pink conjunctiva, MMM, oropharynx clear Neck- Supple, no thyromegaly CVS- RRR, no murmur RESP-CTAB ABD-NABS,soft,NT,ND EXT- No edema Pulses- Radial, DP- 2+        Assessment & Plan:      Problem List Items Addressed This Visit      Unprioritized   CKD (chronic kidney disease), stage III (HCC)   Diabetes mellitus without complication ([place])    Diet controlled for years Check A1C yearly  She had labs done for nephrology will obtain those results       Essential hypertension - Primary    CONTROLLED,  query if the metoprolol is causing some of the fatigue?  They also asked about low sugar, but typically pt eats 2-3 hours before the spell, but advised they can try a snack and see if that helps Often is she lays down, her symptoms improve, which is difficult to tease out       Relevant Medications   metoprolol tartrate (LOPRESSOR) 25 MG tablet   Hyperlipemia    She request to take statin at lunch time Okay as this helps her pill burden       Relevant Medications   metoprolol tartrate (LOPRESSOR) 25 MG tablet   PVC (premature ventricular contraction)   Relevant Medications   metoprolol tartrate (LOPRESSOR) 25 MG tablet      Note: This dictation was prepared with Dragon dictation along with smaller phrase technology. Any transcriptional errors that result from this process are unintentional.

## 2020-04-10 NOTE — Assessment & Plan Note (Signed)
CONTROLLED, query if the metoprolol is causing some of the fatigue?  They also asked about low sugar, but typically pt eats 2-3 hours before the spell, but advised they can try a snack and see if that helps Often is she lays down, her symptoms improve, which is difficult to tease out

## 2020-04-10 NOTE — Assessment & Plan Note (Signed)
She request to take statin at lunch time Okay as this helps her pill burden

## 2020-04-22 ENCOUNTER — Ambulatory Visit (HOSPITAL_COMMUNITY)
Admission: RE | Admit: 2020-04-22 | Discharge: 2020-04-22 | Disposition: A | Discharge status: Home / Self Care | Payer: Medicare Other | Source: Ambulatory Visit | Attending: Nephrology | Admitting: Nephrology

## 2020-04-22 ENCOUNTER — Other Ambulatory Visit: Payer: Self-pay

## 2020-04-22 DIAGNOSIS — E870 Hyperosmolality and hypernatremia: Secondary | ICD-10-CM | POA: Diagnosis not present

## 2020-04-22 DIAGNOSIS — N181 Chronic kidney disease, stage 1: Secondary | ICD-10-CM | POA: Insufficient documentation

## 2020-04-22 DIAGNOSIS — N281 Cyst of kidney, acquired: Secondary | ICD-10-CM | POA: Insufficient documentation

## 2020-04-22 DIAGNOSIS — E1122 Type 2 diabetes mellitus with diabetic chronic kidney disease: Secondary | ICD-10-CM | POA: Insufficient documentation

## 2020-05-12 ENCOUNTER — Other Ambulatory Visit: Payer: Self-pay | Admitting: Family Medicine

## 2020-05-28 ENCOUNTER — Emergency Department (HOSPITAL_COMMUNITY)
Admission: EM | Admit: 2020-05-28 | Discharge: 2020-05-28 | Disposition: A | Discharge status: Home / Self Care | Payer: Medicare Other | Attending: Emergency Medicine | Admitting: Emergency Medicine

## 2020-05-28 ENCOUNTER — Encounter (HOSPITAL_COMMUNITY): Payer: Self-pay | Admitting: Emergency Medicine

## 2020-05-28 ENCOUNTER — Other Ambulatory Visit: Payer: Self-pay

## 2020-05-28 ENCOUNTER — Emergency Department (HOSPITAL_COMMUNITY): Payer: Medicare Other

## 2020-05-28 DIAGNOSIS — I6381 Other cerebral infarction due to occlusion or stenosis of small artery: Secondary | ICD-10-CM | POA: Diagnosis not present

## 2020-05-28 DIAGNOSIS — Z79899 Other long term (current) drug therapy: Secondary | ICD-10-CM | POA: Insufficient documentation

## 2020-05-28 DIAGNOSIS — I129 Hypertensive chronic kidney disease with stage 1 through stage 4 chronic kidney disease, or unspecified chronic kidney disease: Secondary | ICD-10-CM | POA: Diagnosis not present

## 2020-05-28 DIAGNOSIS — R29818 Other symptoms and signs involving the nervous system: Secondary | ICD-10-CM | POA: Diagnosis not present

## 2020-05-28 DIAGNOSIS — Z85038 Personal history of other malignant neoplasm of large intestine: Secondary | ICD-10-CM | POA: Diagnosis not present

## 2020-05-28 DIAGNOSIS — R519 Headache, unspecified: Secondary | ICD-10-CM | POA: Diagnosis not present

## 2020-05-28 DIAGNOSIS — N183 Chronic kidney disease, stage 3 unspecified: Secondary | ICD-10-CM | POA: Insufficient documentation

## 2020-05-28 DIAGNOSIS — Z7982 Long term (current) use of aspirin: Secondary | ICD-10-CM | POA: Diagnosis not present

## 2020-05-28 DIAGNOSIS — E1122 Type 2 diabetes mellitus with diabetic chronic kidney disease: Secondary | ICD-10-CM | POA: Insufficient documentation

## 2020-05-28 DIAGNOSIS — I10 Essential (primary) hypertension: Secondary | ICD-10-CM | POA: Diagnosis not present

## 2020-05-28 DIAGNOSIS — E039 Hypothyroidism, unspecified: Secondary | ICD-10-CM | POA: Insufficient documentation

## 2020-05-28 LAB — URINALYSIS, ROUTINE W REFLEX MICROSCOPIC
Bilirubin Urine: NEGATIVE
Glucose, UA: NEGATIVE mg/dL
Hgb urine dipstick: NEGATIVE
Ketones, ur: NEGATIVE mg/dL
Leukocytes,Ua: NEGATIVE
Nitrite: NEGATIVE
Protein, ur: NEGATIVE mg/dL
Specific Gravity, Urine: 1.003 — ABNORMAL LOW (ref 1.005–1.030)
pH: 7 (ref 5.0–8.0)

## 2020-05-28 LAB — DIFFERENTIAL
Abs Immature Granulocytes: 0.01 10*3/uL (ref 0.00–0.07)
Basophils Absolute: 0 10*3/uL (ref 0.0–0.1)
Basophils Relative: 1 %
Eosinophils Absolute: 0.3 10*3/uL (ref 0.0–0.5)
Eosinophils Relative: 7 %
Immature Granulocytes: 0 %
Lymphocytes Relative: 35 %
Lymphs Abs: 1.6 10*3/uL (ref 0.7–4.0)
Monocytes Absolute: 0.3 10*3/uL (ref 0.1–1.0)
Monocytes Relative: 7 %
Neutro Abs: 2.3 10*3/uL (ref 1.7–7.7)
Neutrophils Relative %: 50 %

## 2020-05-28 LAB — CBC
HCT: 44.8 % (ref 36.0–46.0)
Hemoglobin: 14.3 g/dL (ref 12.0–15.0)
MCH: 31.6 pg (ref 26.0–34.0)
MCHC: 31.9 g/dL (ref 30.0–36.0)
MCV: 98.9 fL (ref 80.0–100.0)
Platelets: 302 10*3/uL (ref 150–400)
RBC: 4.53 MIL/uL (ref 3.87–5.11)
RDW: 14.1 % (ref 11.5–15.5)
WBC: 4.6 10*3/uL (ref 4.0–10.5)
nRBC: 0 % (ref 0.0–0.2)

## 2020-05-28 LAB — PROTIME-INR
INR: 1.1 (ref 0.8–1.2)
Prothrombin Time: 13.4 seconds (ref 11.4–15.2)

## 2020-05-28 LAB — RAPID URINE DRUG SCREEN, HOSP PERFORMED
Amphetamines: NOT DETECTED
Barbiturates: NOT DETECTED
Benzodiazepines: NOT DETECTED
Cocaine: NOT DETECTED
Opiates: NOT DETECTED
Tetrahydrocannabinol: NOT DETECTED

## 2020-05-28 LAB — COMPREHENSIVE METABOLIC PANEL
ALT: 20 U/L (ref 0–44)
AST: 23 U/L (ref 15–41)
Albumin: 4.1 g/dL (ref 3.5–5.0)
Alkaline Phosphatase: 112 U/L (ref 38–126)
Anion gap: 8 (ref 5–15)
BUN: 18 mg/dL (ref 8–23)
CO2: 28 mmol/L (ref 22–32)
Calcium: 9.5 mg/dL (ref 8.9–10.3)
Chloride: 104 mmol/L (ref 98–111)
Creatinine, Ser: 1.15 mg/dL — ABNORMAL HIGH (ref 0.44–1.00)
GFR, Estimated: 47 mL/min — ABNORMAL LOW (ref >60)
Glucose, Bld: 94 mg/dL (ref 70–99)
Potassium: 3.6 mmol/L (ref 3.5–5.1)
Sodium: 140 mmol/L (ref 135–145)
Total Bilirubin: 0.9 mg/dL (ref 0.3–1.2)
Total Protein: 8.7 g/dL — ABNORMAL HIGH (ref 6.5–8.1)

## 2020-05-28 LAB — ETHANOL: Alcohol, Ethyl (B): <10 mg/dL (ref <10)

## 2020-05-28 LAB — APTT: aPTT: 31 seconds (ref 24–36)

## 2020-05-28 MED ORDER — ACETAMINOPHEN 325 MG PO TABS
650.0000 mg | ORAL_TABLET | Freq: Once | ORAL | Status: AC
  Administered 2020-05-28: 650 mg via ORAL
  Filled 2020-05-28: qty 2

## 2020-05-28 NOTE — ED Triage Notes (Addendum)
Pt c/o of a sudden headache since Saturday. Pt stated she woke up at 0330 this morning with headache. At 0730 this morning, she noticed left arm weakness, left arm and left side of lip had decreased sensation    States having previous TIAs and was told to come to ED if sudden headaches occurred per neurologist

## 2020-05-28 NOTE — Discharge Instructions (Addendum)
Continue your tramadol for your headache.  You can also supplement with acetaminophen.  Follow-up with your neurologist or primary care doctor as we discussed.  Return to the ED for new numbness or weakness or other worsening symptoms

## 2020-05-28 NOTE — ED Notes (Addendum)
Patient transported to CT by CT staff via stretcher.

## 2020-05-28 NOTE — ED Provider Notes (Signed)
[facility] Provider Note   CSN: [id number] Arrival date & time: 05/28/20  1000     History Chief Complaint  Patient presents with  . Headache    [name] is a 86 y.o. female.  HPI   Patient states she started having trouble with a headache on Saturday.  Over the weekend it was coming and going.  She will take some medication(she cannot recall the name of the medication) and it would improve but the headache was coming off and on for the last couple of days.  Patient states this morning though she started having some trouble with tingling in the left side of her mouth and also felt she had numbness in her arm.  She also felt some weakness in that arm.  Those symptoms have resolved.  She is not having any numbness or weakness right now.  She denies any fevers or chills.  No trouble with her speech or balance.  No trouble with her vision.  Patient has had TIAs before and was instructed to come to the ED if she had recurrent symptoms.  Past Medical History:  Diagnosis Date  . Allergy   . Arthritis   . Colon cancer ([place])    colon ca dx 07/30/09  . History of cardiac monitoring 07/2017   "Event monitor demonstrated sinus rhythm with isolated PACs and no arrhythmias"  . History of colon cancer 06/2009   found at time of TCS 06/29/09, 1.2cm sessile cecal polyp, no adjuvent therapy needed  . HTN (hypertension)   . Hx of cardiovascular stress test 07/2017   "No diagnostic ST segment changes to indicate ischemia. Small, moderate intensity, reversible apical to basal inferolateral defect consistent with ischemia. This is a low risk study. Nuclear stress EF: 84%."  . Hyperlipidemia   . Hypothyroidism   . PE (pulmonary thromboembolism) ([place])   . Renal disorder    cyst on kidney   . Stroke ([place])   . Vertigo     Patient Active Problem List   Diagnosis Date Noted  . Diabetes mellitus without complication ([place]) 02/58/5277  . Complex partial seizure ([place])  03/02/2019  . Left hemiparesis ([place]) 03/01/2019  . Sensory disturbance 03/01/2019  . TIA (transient ischemic attack) 11/25/2018  . Silent micro-hemorrhage of brain ([place]) 11/25/2018  . PVC (premature ventricular contraction) 11/16/2018  . DDD (degenerative disc disease), cervical 08/16/2018  . Hyperlipemia 08/16/2018  . History of pulmonary embolism 04/26/2018  . Insomnia 12/15/2015  . Paresthesia of left upper and lower extremity 11/21/2015  . Left leg weakness 11/21/2015  . Paresthesias   . Essential hypertension 11/02/2015  . Hypothyroidism 11/02/2015  . Vertigo 11/02/2015  . CKD (chronic kidney disease), stage III ([place]) 11/02/2015  . Loss of weight 11/02/2015  . OA (osteoarthritis) of knee 11/02/2015  . Colon cancer ([place]) 02/28/2011  . History of colon cancer 06/29/2010  . Constipation 06/10/2009    Past Surgical History:  Procedure Laterality Date  . ABDOMINAL HYSTERECTOMY    . COLON SURGERY  07/2009   right hemicolectomy, no residual colon cancer on path  . COLONOSCOPY  07/16/2010   [id number] POLYP-TCS 3 YEARS  . COLONOSCOPY N/A 08/02/2013   hyperplastic polyps, surveillance in 2020 if benefits outweight the risks  . COLONOSCOPY  06/2009   1.2 cm sessile cecal polyp which had adenocarcinoma arising in a tubular adenoma.  Marland Kitchen PARTIAL THYMECTOMY    . partial thyroidectomy     benign tumors     OB History  Gravida  3   Para  2   Term  1   Preterm  1   AB  1   Living  2     SAB  1   IAB      Ectopic      Multiple      Live Births              Family History  Problem Relation Age of Onset  . Colon cancer Mother        >age60  . Arthritis Mother   . Cancer Mother   . Heart disease Mother   . Hyperlipidemia Mother   . Hypertension Mother   . Heart attack Father   . Heart disease Father   . Diabetes Maternal Aunt   . Hyperlipidemia Daughter   . Hypertension Daughter   . Liver disease Neg Hx     Social History   Tobacco Use  .  Smoking status: Never Smoker  . Smokeless tobacco: Never Used  Vaping Use  . Vaping Use: Never used  Substance Use Topics  . Alcohol use: No    Alcohol/week: 0.0 standard drinks  . Drug use: No    Home Medications Prior to Admission medications   Medication Sig Start Date End Date Taking? Authorizing Provider  acetaminophen (TYLENOL) 500 MG tablet Take 1,000 mg by mouth every 8 (eight) hours as needed for mild pain or headache.    Yes [provider]  amLODipine (NORVASC) 5 MG tablet Take 1 tablet (5 mg total) by mouth daily. 09/20/19  Yes [place], Modena Nunnery, MD  ASPERCREME LIDOCAINE EX Apply 1 application topically daily as needed (for knee pain).    Yes [provider]  aspirin 325 MG EC tablet Take 325 mg by mouth daily.   Yes [provider]  atorvastatin (LIPITOR) 40 MG tablet TAKE 2 TABLETS BY MOUTH ONCE A DAY. 05/13/20  Yes [place], Modena Nunnery, MD  cholecalciferol (VITAMIN D3) 25 MCG (1000 UT) tablet Take 1,000 Units by mouth daily.    Yes [provider]  fluocinonide cream (LIDEX) 5.57 % Apply 1 application topically 2 (two) times daily as needed (swelling, itching, and redness of skin). 09/30/19  Yes [provider]  gabapentin (NEURONTIN) 300 MG capsule Take 1 in the morning and 1 in the evening Patient taking differently: Take 300 mg by mouth 2 (two) times daily. Take 1 in the morning and 1 in the evening 10/26/18  Yes [place], Modena Nunnery, MD  levETIRAcetam (KEPPRA) 250 MG tablet Take 1 tablet (250 mg total) by mouth 2 (two) times daily. Patient taking differently: Take 125 mg by mouth 2 (two) times daily. Patient can only tolerate 1/2 tab 03/02/19  Yes Tat, Shanon Brow, MD  linaclotide [facility]) 72 MCG capsule Take 1 capsule (72 mcg total) by mouth daily as needed (for constipation). 09/20/19  Yes [place], Modena Nunnery, MD  loratadine (CLARITIN) 10 MG tablet Take 10 mg by mouth daily.    Yes [provider]  metoprolol tartrate (LOPRESSOR) 25 MG  tablet TAKE 1&1/2 TABLET BY MOUTH TWICE A DAY, Take extra 1/2 tablet daily for palpitaitons Patient taking differently: Take 12.5-37.5 mg by mouth 2 (two) times daily. Take 1 and 1/2 tablet in the am and evening. Take extra 1/2 tablet daily PRN for palpitaitons 04/10/20  Yes [place], Modena Nunnery, MD  RESTASIS 0.05 % ophthalmic emulsion Place 1 drop into both eyes 2 (two) times daily as needed (chronic dry eye). 05/17/10  Yes [provider]  traMADol (ULTRAM) 50 MG tablet Take 25 mg by mouth every 8 (eight) hours as needed for moderate pain.  01/10/19  Yes [provider]    Allergies    Tape  Review of Systems   Review of Systems  All other systems reviewed and are negative.   Physical Exam Updated Vital Signs BP (!) 145/73 (BP Location: Right Arm)   Pulse 60   Temp 98.4 F (36.9 C) (Oral)   Resp 18   Ht 1.6 m (5\' 3" )   Wt 73 kg   SpO2 98%   BMI 28.52 kg/m   Physical Exam Vitals and nursing note reviewed.  Constitutional:      General: She is not in acute distress.    Appearance: She is well-developed.  HENT:     Head: Normocephalic and atraumatic.     Right Ear: External ear normal.     Left Ear: External ear normal.  Eyes:     General: No scleral icterus.       Right eye: No discharge.        Left eye: No discharge.     Conjunctiva/sclera: Conjunctivae normal.  Neck:     Trachea: No tracheal deviation.  Cardiovascular:     Rate and Rhythm: Normal rate and regular rhythm.  Pulmonary:     Effort: Pulmonary effort is normal. No respiratory distress.     Breath sounds: Normal breath sounds. No stridor. No wheezing or rales.  Abdominal:     General: Bowel sounds are normal. There is no distension.     Palpations: Abdomen is soft.     Tenderness: There is no abdominal tenderness. There is no guarding or rebound.  Musculoskeletal:        General: No tenderness.     Cervical back: Neck supple.  Skin:    General: Skin is warm and dry.     Findings: No  rash.  Neurological:     Mental Status: She is alert and oriented to person, place, and time.     Cranial Nerves: No cranial nerve deficit (No facial droop, extraocular movements intact, tongue midline ).     Sensory: No sensory deficit.     Motor: No abnormal muscle tone or seizure activity.     Coordination: Coordination normal.     Comments: No pronator drift bilateral upper extrem, able to hold both legs off bed for 5 seconds, sensation intact in all extremities, no visual field cuts, no left or right sided neglect, normal finger-nose exam bilaterally, no nystagmus noted      ED Results / Procedures / Treatments   Labs (all labs ordered are listed, but only abnormal results are displayed) Labs Reviewed  COMPREHENSIVE METABOLIC PANEL - Abnormal; Notable for the following components:      Result Value   Creatinine, Ser 1.15 (*)    Total Protein 8.7 (*)    GFR, Estimated 47 (*)    All other components within normal limits  URINALYSIS, ROUTINE W REFLEX MICROSCOPIC - Abnormal; Notable for the following components:   Color, Urine COLORLESS (*)    Specific Gravity, Urine 1.003 (*)    All other components within normal limits  ETHANOL  PROTIME-INR  APTT  CBC  DIFFERENTIAL  RAPID URINE DRUG SCREEN, HOSP PERFORMED    EKG EKG Interpretation  Date/Time:  Thursday May 28 2020 10:56:00 EDT Ventricular Rate:  64 PR Interval:    QRS Duration: 92 QT Interval:  422 QTC  Calculation: 436 R Axis:   -33 Text Interpretation: Sinus rhythm Left ventricular hypertrophy Inferior infarct, old No significant change since last tracing Confirmed by Dorie Rank [phone]) on 05/28/2020 11:00:08 AM   Radiology CT HEAD WO CONTRAST  Result Date: 05/28/2020 CLINICAL DATA:  Focal neuro deficit, greater than 6 hours, stroke suspected. Additional history provided: Sudden onset headache beginning Saturday, patient awoke this morning with headache, patient reports left arm weakness and decreased sensation  in left arm and left side of face onset 7:30 this morning. EXAM: CT HEAD WITHOUT CONTRAST TECHNIQUE: Contiguous axial images were obtained from the base of the skull through the vertex without intravenous contrast. COMPARISON:  Prior brain MRI examinations 07/18/2019 and earlier. Head CT 02/28/2019. FINDINGS: Brain: Moderate cerebral atrophy. Redemonstrated severe chronic small vessel ischemic disease within the bilateral hemispheric white matter. Redemonstrated chronic lacunar infarcts within the deep gray nuclei bilaterally. Known chronic small vessel ischemic disease within the pons was better appreciated on the prior brain MRI of 07/18/2019. There is no acute intracranial hemorrhage. No demarcated cortical infarct. No extra-axial fluid collection. No midline shift. The pituitary gland is somewhat prominent for age, but similar in size on comparison examinations dating back to 11/22/2015. Vascular: No hyperdense vessel.  Atherosclerotic calcifications. Skull: Normal. Negative for fracture or focal lesion. Sinuses/Orbits: Visualized orbits show no acute finding. Mild bilateral ethmoid and right maxillary sinus mucosal thickening at the imaged levels. IMPRESSION: No CT evidence of acute intracranial abnormality. Redemonstrated severe chronic small vessel ischemic disease with multiple chronic deep gray nuclei lacunar infarcts. Stable moderate cerebral atrophy. Electronically Signed   By: Kellie Simmering DO   On: 05/28/2020 13:39    Procedures Procedures   Medications Ordered in ED Medications  acetaminophen (TYLENOL) tablet 650 mg (650 mg Oral Given 05/28/20 1048)    ED Course  I have reviewed the triage vital signs and the nursing notes.  Pertinent labs & imaging results that were available during my care of the patient were reviewed by me and considered in my medical decision making (see chart for details).  Clinical Course as of 05/28/20 1410  Thu May 28, 2020  1153 UA negative.  CBC metabolic  panel unremarkable. [PF]  7902 Head CT without acute findings [JK]    Clinical Course User Index [JK] Dorie Rank, MD   MDM Rules/Calculators/A&P                          Patient presented to the ED for evaluation headache for several days as well as new tingling on the left side.  In the ED the patient symptoms have resolved and she was not having any deficits on exam.  Patient's headache is not severe.  I doubt meningitis or subarachnoid hemorrhage.  No findings to suggest stroke at this time.  However, cannot fully exclude occult stroke or TIA.  Discussed this with the patient and her daughter.  We could consider doing an MRI today but at this point they feel comfortable going home.  Patient is an NIH stroke scale of 0.   they will plan to follow-up with her primary care doctor or neurologist.  Warning signs and precautions discussed. Final Clinical Impression(s) / ED Diagnoses Final diagnoses:  Nonintractable headache, unspecified chronicity pattern, unspecified headache type    Rx / DC Orders ED Discharge Orders    None       Dorie Rank, MD 05/28/20 [phone]

## 2020-05-28 NOTE — ED Notes (Signed)
ED Provider at bedside. 

## 2020-05-28 NOTE — ED Notes (Signed)

## 2020-06-02 DIAGNOSIS — I10 Essential (primary) hypertension: Secondary | ICD-10-CM | POA: Diagnosis not present

## 2020-06-02 DIAGNOSIS — I6930 Unspecified sequelae of cerebral infarction: Secondary | ICD-10-CM | POA: Diagnosis not present

## 2020-06-02 DIAGNOSIS — I679 Cerebrovascular disease, unspecified: Secondary | ICD-10-CM | POA: Diagnosis not present

## 2020-06-02 DIAGNOSIS — Z79891 Long term (current) use of opiate analgesic: Secondary | ICD-10-CM | POA: Diagnosis not present

## 2020-06-02 DIAGNOSIS — G890 Central pain syndrome: Secondary | ICD-10-CM | POA: Diagnosis not present

## 2020-06-09 DIAGNOSIS — E870 Hyperosmolality and hypernatremia: Secondary | ICD-10-CM | POA: Diagnosis not present

## 2020-06-09 DIAGNOSIS — I5032 Chronic diastolic (congestive) heart failure: Secondary | ICD-10-CM | POA: Diagnosis not present

## 2020-06-09 DIAGNOSIS — I129 Hypertensive chronic kidney disease with stage 1 through stage 4 chronic kidney disease, or unspecified chronic kidney disease: Secondary | ICD-10-CM | POA: Diagnosis not present

## 2020-06-09 DIAGNOSIS — E1122 Type 2 diabetes mellitus with diabetic chronic kidney disease: Secondary | ICD-10-CM | POA: Diagnosis not present

## 2020-06-09 DIAGNOSIS — N189 Chronic kidney disease, unspecified: Secondary | ICD-10-CM | POA: Diagnosis not present

## 2020-06-12 DIAGNOSIS — I5032 Chronic diastolic (congestive) heart failure: Secondary | ICD-10-CM | POA: Diagnosis not present

## 2020-06-12 DIAGNOSIS — N281 Cyst of kidney, acquired: Secondary | ICD-10-CM | POA: Diagnosis not present

## 2020-06-12 DIAGNOSIS — I129 Hypertensive chronic kidney disease with stage 1 through stage 4 chronic kidney disease, or unspecified chronic kidney disease: Secondary | ICD-10-CM | POA: Diagnosis not present

## 2020-06-12 DIAGNOSIS — E1122 Type 2 diabetes mellitus with diabetic chronic kidney disease: Secondary | ICD-10-CM | POA: Diagnosis not present

## 2020-06-12 DIAGNOSIS — E870 Hyperosmolality and hypernatremia: Secondary | ICD-10-CM | POA: Diagnosis not present

## 2020-06-12 DIAGNOSIS — N189 Chronic kidney disease, unspecified: Secondary | ICD-10-CM | POA: Diagnosis not present

## 2020-06-23 ENCOUNTER — Ambulatory Visit: Payer: Medicare Other | Admitting: Cardiology

## 2020-06-23 ENCOUNTER — Encounter: Payer: Self-pay | Admitting: Cardiology

## 2020-06-23 ENCOUNTER — Other Ambulatory Visit: Payer: Self-pay

## 2020-06-23 VITALS — BP 136/68 | HR 70 | Ht 63.0 in | Wt 163.0 lb

## 2020-06-23 DIAGNOSIS — I10 Essential (primary) hypertension: Secondary | ICD-10-CM

## 2020-06-23 DIAGNOSIS — R002 Palpitations: Secondary | ICD-10-CM | POA: Diagnosis not present

## 2020-06-23 DIAGNOSIS — E782 Mixed hyperlipidemia: Secondary | ICD-10-CM

## 2020-06-23 MED ORDER — METOPROLOL TARTRATE 25 MG PO TABS
ORAL_TABLET | ORAL | 3 refills | Status: DC
Start: 2020-06-23 — End: 2021-01-07

## 2020-06-23 NOTE — Progress Notes (Signed)
Clinical Summary [name] is a 86 y.o.female former patient of Dr Bronson Ing, this is our first visit together.   1. Palpitations - prior monitor showed PACs, PVCs, short runs of SVT longest 11 seconds - taking lopressor 37.5mg  bid - still symptoms at times in the morning.   2. HTN - compliant with meds   3. Unprovoked Pulmonary embolus 04/26/18 -  followed by intracranial hemorrhage on eliquis which was stopped and she's now on ASA 325 mg daily.  4. Hyperlipidemia - 09/2019 TC 130 HDL 46 TG 107 LDL 65 Past Medical History:  Diagnosis Date  . Allergy   . Arthritis   . Colon cancer ([place])    colon ca dx 07/30/09  . History of cardiac monitoring 07/2017   "Event monitor demonstrated sinus rhythm with isolated PACs and no arrhythmias"  . History of colon cancer 06/2009   found at time of TCS 06/29/09, 1.2cm sessile cecal polyp, no adjuvent therapy needed  . HTN (hypertension)   . Hx of cardiovascular stress test 07/2017   "No diagnostic ST segment changes to indicate ischemia. Small, moderate intensity, reversible apical to basal inferolateral defect consistent with ischemia. This is a low risk study. Nuclear stress EF: 84%."  . Hyperlipidemia   . Hypothyroidism   . PE (pulmonary thromboembolism) ([place])   . Renal disorder    cyst on kidney   . Stroke ([place])   . Vertigo      Allergies  Allergen Reactions  . Tape Rash    Zio monitor adhesive causes ulcerated and infected skin .Had to see derm     Current Outpatient Medications  Medication Sig Dispense Refill  . acetaminophen (TYLENOL) 500 MG tablet Take 1,000 mg by mouth every 8 (eight) hours as needed for mild pain or headache.     Marland Kitchen amLODipine (NORVASC) 5 MG tablet Take 1 tablet (5 mg total) by mouth daily. 90 tablet 3  . ASPERCREME LIDOCAINE EX Apply 1 application topically daily as needed (for knee pain).     Marland Kitchen aspirin 325 MG EC tablet Take 325 mg by mouth daily.    Marland Kitchen atorvastatin (LIPITOR) 40 MG tablet  TAKE 2 TABLETS BY MOUTH ONCE A DAY. 180 tablet 0  . cholecalciferol (VITAMIN D3) 25 MCG (1000 UT) tablet Take 1,000 Units by mouth daily.     . fluocinonide cream (LIDEX) 6.96 % Apply 1 application topically 2 (two) times daily as needed (swelling, itching, and redness of skin).    Marland Kitchen gabapentin (NEURONTIN) 300 MG capsule Take 1 in the morning and 1 in the evening (Patient taking differently: Take 300 mg by mouth 2 (two) times daily. Take 1 in the morning and 1 in the evening)    . levETIRAcetam (KEPPRA) 250 MG tablet Take 1 tablet (250 mg total) by mouth 2 (two) times daily. (Patient taking differently: Take 125 mg by mouth 2 (two) times daily. Patient can only tolerate 1/2 tab) 60 tablet 1  . linaclotide (LINZESS) 72 MCG capsule Take 1 capsule (72 mcg total) by mouth daily as needed (for constipation). 90 capsule 2  . loratadine (CLARITIN) 10 MG tablet Take 10 mg by mouth daily.     . metoprolol tartrate (LOPRESSOR) 25 MG tablet TAKE 1&1/2 TABLET BY MOUTH TWICE A DAY, Take extra 1/2 tablet daily for palpitaitons (Patient taking differently: Take 12.5-37.5 mg by mouth 2 (two) times daily. Take 1 and 1/2 tablet in the am and evening. Take extra 1/2 tablet daily PRN for  palpitaitons) 120 tablet 2  . RESTASIS 0.05 % ophthalmic emulsion Place 1 drop into both eyes 2 (two) times daily as needed (chronic dry eye).    . traMADol (ULTRAM) 50 MG tablet Take 25 mg by mouth every 8 (eight) hours as needed for moderate pain.      No current facility-administered medications for this visit.     Past Surgical History:  Procedure Laterality Date  . ABDOMINAL HYSTERECTOMY    . COLON SURGERY  07/2009   right hemicolectomy, no residual colon cancer on path  . COLONOSCOPY  07/16/2010   [id number] POLYP-TCS 3 YEARS  . COLONOSCOPY N/A 08/02/2013   hyperplastic polyps, surveillance in 2020 if benefits outweight the risks  . COLONOSCOPY  06/2009   1.2 cm sessile cecal polyp which had adenocarcinoma arising in  a tubular adenoma.  Marland Kitchen PARTIAL THYMECTOMY    . partial thyroidectomy     benign tumors     Allergies  Allergen Reactions  . Tape Rash    Zio monitor adhesive causes ulcerated and infected skin .Had to see derm      Family History  Problem Relation Age of Onset  . Colon cancer Mother        >age60  . Arthritis Mother   . Cancer Mother   . Heart disease Mother   . Hyperlipidemia Mother   . Hypertension Mother   . Heart attack Father   . Heart disease Father   . Diabetes Maternal Aunt   . Hyperlipidemia Daughter   . Hypertension Daughter   . Liver disease Neg Hx      Social History [name] reports that she has never smoked. She has never used smokeless tobacco. [name] reports no history of alcohol use.   Review of Systems CONSTITUTIONAL: No weight loss, fever, chills, weakness or fatigue.  HEENT: Eyes: No visual loss, blurred vision, double vision or yellow sclerae.No hearing loss, sneezing, congestion, runny nose or sore throat.  SKIN: No rash or itching.  CARDIOVASCULAR: per hpi RESPIRATORY: No shortness of breath, cough or sputum.  GASTROINTESTINAL: No anorexia, nausea, vomiting or diarrhea. No abdominal pain or blood.  GENITOURINARY: No burning on urination, no polyuria NEUROLOGICAL: No headache, dizziness, syncope, paralysis, ataxia, numbness or tingling in the extremities. No change in bowel or bladder control.  MUSCULOSKELETAL: No muscle, back pain, joint pain or stiffness.  LYMPHATICS: No enlarged nodes. No history of splenectomy.  PSYCHIATRIC: No history of depression or anxiety.  ENDOCRINOLOGIC: No reports of sweating, cold or heat intolerance. No polyuria or polydipsia.  Marland Kitchen   Physical Examination Today's Vitals   06/23/20 1425  BP: 136/68  Pulse: 70  SpO2: 98%  Weight: 163 lb (73.9 kg)  Height: 5\' 3"  (1.6 m)   Body mass index is 28.87 kg/m.  Gen: resting comfortably, no acute distress HEENT: no scleral icterus, pupils equal  round and reactive, no palptable cervical adenopathy,  CV: RRR, no m/rg, no jvd Resp: Clear to auscultation bilaterally GI: abdomen is soft, non-tender, non-distended, normal bowel sounds, no hepatosplenomegaly MSK: extremities are warm, no edema.  Skin: warm, no rash Neuro:  no focal deficits Psych: appropriate affect      Assessment and Plan  1. Palpitations - some symptoms first thing in the morning, we will keep her AM lopressor at 37.5mg  and increase her evening dose to 50mg   2. HTN - at goal, continue current meds   3. Hyperlipdiemia - at goal,continue statin    F/u1 year  Arnoldo Lenis, M.D.

## 2020-06-23 NOTE — Patient Instructions (Addendum)
Medication Instructions:  Take Lopressor 37.5 mg am and 50 mg pm. May take additional 25 mg daily for palpitations.   *If you need a refill on your cardiac medications before your next appointment, please call your pharmacy*   Lab Work: None today If you have labs (blood work) drawn today and your tests are completely normal, you will receive your results only by: Marland Kitchen MyChart Message (if you have MyChart) OR . A paper copy in the mail If you have any lab test that is abnormal or we need to change your treatment, we will call you to review the results.   Testing/Procedures: None today   Follow-Up: At [facility], you and your health needs are our priority.  As part of our continuing mission to provide you with exceptional heart care, we have created designated Provider Care Teams.  These Care Teams include your primary Cardiologist (physician) and Advanced Practice Providers (APPs -  Physician Assistants and Nurse Practitioners) who all work together to provide you with the care you need, when you need it.  We recommend signing up for the patient portal called "MyChart".  Sign up information is provided on this After Visit Summary.  MyChart is used to connect with patients for Virtual Visits (Telemedicine).  Patients are able to view lab/test results, encounter notes, upcoming appointments, etc.  Non-urgent messages can be sent to your provider as well.   To learn more about what you can do with MyChart, go to [email or web address].    Your next appointment:   12 month(s)  The format for your next appointment:   In Person  Provider:   Carlyle Dolly, MD   Other Instructions None

## 2020-06-24 DIAGNOSIS — I10 Essential (primary) hypertension: Secondary | ICD-10-CM | POA: Diagnosis not present

## 2020-06-24 DIAGNOSIS — I6930 Unspecified sequelae of cerebral infarction: Secondary | ICD-10-CM | POA: Diagnosis not present

## 2020-06-24 DIAGNOSIS — Z79891 Long term (current) use of opiate analgesic: Secondary | ICD-10-CM | POA: Diagnosis not present

## 2020-06-24 DIAGNOSIS — G890 Central pain syndrome: Secondary | ICD-10-CM | POA: Diagnosis not present

## 2020-06-24 DIAGNOSIS — I679 Cerebrovascular disease, unspecified: Secondary | ICD-10-CM | POA: Diagnosis not present

## 2020-07-15 DIAGNOSIS — I679 Cerebrovascular disease, unspecified: Secondary | ICD-10-CM | POA: Diagnosis not present

## 2020-07-15 DIAGNOSIS — Z79891 Long term (current) use of opiate analgesic: Secondary | ICD-10-CM | POA: Diagnosis not present

## 2020-07-15 DIAGNOSIS — G890 Central pain syndrome: Secondary | ICD-10-CM | POA: Diagnosis not present

## 2020-07-15 DIAGNOSIS — I10 Essential (primary) hypertension: Secondary | ICD-10-CM | POA: Diagnosis not present

## 2020-07-15 DIAGNOSIS — I6930 Unspecified sequelae of cerebral infarction: Secondary | ICD-10-CM | POA: Diagnosis not present

## 2020-07-31 ENCOUNTER — Other Ambulatory Visit (HOSPITAL_COMMUNITY): Payer: Self-pay | Admitting: *Deleted

## 2020-07-31 DIAGNOSIS — C188 Malignant neoplasm of overlapping sites of colon: Secondary | ICD-10-CM

## 2020-07-31 DIAGNOSIS — I2699 Other pulmonary embolism without acute cor pulmonale: Secondary | ICD-10-CM

## 2020-08-03 ENCOUNTER — Other Ambulatory Visit: Payer: Self-pay

## 2020-08-03 ENCOUNTER — Inpatient Hospital Stay (HOSPITAL_COMMUNITY): Payer: Medicare Other | Attending: Hematology

## 2020-08-03 DIAGNOSIS — Z9049 Acquired absence of other specified parts of digestive tract: Secondary | ICD-10-CM | POA: Diagnosis not present

## 2020-08-03 DIAGNOSIS — Z7982 Long term (current) use of aspirin: Secondary | ICD-10-CM | POA: Diagnosis not present

## 2020-08-03 DIAGNOSIS — Z85038 Personal history of other malignant neoplasm of large intestine: Secondary | ICD-10-CM | POA: Diagnosis not present

## 2020-08-03 DIAGNOSIS — Z86711 Personal history of pulmonary embolism: Secondary | ICD-10-CM | POA: Diagnosis not present

## 2020-08-03 DIAGNOSIS — C188 Malignant neoplasm of overlapping sites of colon: Secondary | ICD-10-CM

## 2020-08-03 DIAGNOSIS — I2699 Other pulmonary embolism without acute cor pulmonale: Secondary | ICD-10-CM

## 2020-08-03 LAB — CBC WITH DIFFERENTIAL/PLATELET
Abs Immature Granulocytes: 0.02 10*3/uL (ref 0.00–0.07)
Basophils Absolute: 0 10*3/uL (ref 0.0–0.1)
Basophils Relative: 1 %
Eosinophils Absolute: 0.3 10*3/uL (ref 0.0–0.5)
Eosinophils Relative: 7 %
HCT: 42.3 % (ref 36.0–46.0)
Hemoglobin: 13.3 g/dL (ref 12.0–15.0)
Immature Granulocytes: 0 %
Lymphocytes Relative: 31 %
Lymphs Abs: 1.5 10*3/uL (ref 0.7–4.0)
MCH: 31.9 pg (ref 26.0–34.0)
MCHC: 31.4 g/dL (ref 30.0–36.0)
MCV: 101.4 fL — ABNORMAL HIGH (ref 80.0–100.0)
Monocytes Absolute: 0.5 10*3/uL (ref 0.1–1.0)
Monocytes Relative: 11 %
Neutro Abs: 2.4 10*3/uL (ref 1.7–7.7)
Neutrophils Relative %: 50 %
Platelets: 257 10*3/uL (ref 150–400)
RBC: 4.17 MIL/uL (ref 3.87–5.11)
RDW: 15 % (ref 11.5–15.5)
WBC: 4.7 10*3/uL (ref 4.0–10.5)
nRBC: 0 % (ref 0.0–0.2)

## 2020-08-03 LAB — COMPREHENSIVE METABOLIC PANEL
ALT: 20 U/L (ref 0–44)
AST: 20 U/L (ref 15–41)
Albumin: 3.9 g/dL (ref 3.5–5.0)
Alkaline Phosphatase: 103 U/L (ref 38–126)
Anion gap: 6 (ref 5–15)
BUN: 18 mg/dL (ref 8–23)
CO2: 25 mmol/L (ref 22–32)
Calcium: 9.2 mg/dL (ref 8.9–10.3)
Chloride: 109 mmol/L (ref 98–111)
Creatinine, Ser: 1.3 mg/dL — ABNORMAL HIGH (ref 0.44–1.00)
GFR, Estimated: 41 mL/min — ABNORMAL LOW (ref >60)
Glucose, Bld: 101 mg/dL — ABNORMAL HIGH (ref 70–99)
Potassium: 3.8 mmol/L (ref 3.5–5.1)
Sodium: 140 mmol/L (ref 135–145)
Total Bilirubin: 0.7 mg/dL (ref 0.3–1.2)
Total Protein: 7.5 g/dL (ref 6.5–8.1)

## 2020-08-03 LAB — LACTATE DEHYDROGENASE: LDH: 160 U/L (ref 98–192)

## 2020-08-03 LAB — D-DIMER, QUANTITATIVE: D-Dimer, Quant: 3.31 ug/mL-FEU — ABNORMAL HIGH (ref 0.00–0.50)

## 2020-08-08 NOTE — Progress Notes (Signed)
[place], [place] [address]   CLINIC:  Medical Oncology/Hematology  PCP:  Lindell Spar, MD [address] / [place] [address]  [phone]  REASON FOR VISIT:  Follow-up for PE and colon cancer  PRIOR THERAPY:  1. Right colon segmental resection on 07/31/2009. 2. Eliquis from 04/2018 to 11/25/2018.  CURRENT THERAPY: Aspirin 325 mg QD  INTERVAL HISTORY:  [name], a 86 y.o. female, returns for routine follow-up for her PE and colon cancer. Madylyn was last seen on 02/04/2020.  Today she reports feeling good. She denies any CP or SOB. She is taking 1 Asprin daily, and she is not currently taking Eliquis. She denies any leg swelling or pain. She has never has a blood clot in her legs, only in her lungs. She occasionally does PT leg exercises. She can do most of her normal activities at home, and she drinks 1 Boost daily. She reports headaches that have improved following her last trip to the ED on 05/28/2020.   REVIEW OF SYSTEMS:  Review of Systems  Constitutional: Positive for fatigue (75%). Negative for appetite change.  Respiratory: Negative for shortness of breath.   Cardiovascular: Negative for chest pain and leg swelling.  Neurological: Positive for headaches.  All other systems reviewed and are negative.   PAST MEDICAL/SURGICAL HISTORY:  Past Medical History:  Diagnosis Date  . Allergy   . Arthritis   . Colon cancer ([place])    colon ca dx 07/30/09  . History of cardiac monitoring 07/2017   "Event monitor demonstrated sinus rhythm with isolated PACs and no arrhythmias"  . History of colon cancer 06/2009   found at time of TCS 06/29/09, 1.2cm sessile cecal polyp, no adjuvent therapy needed  . HTN (hypertension)   . Hx of cardiovascular stress test 07/2017   "No diagnostic ST segment changes to indicate ischemia. Small, moderate intensity, reversible apical to basal inferolateral defect consistent with ischemia.  This is a low risk study. Nuclear stress EF: 84%."  . Hyperlipidemia   . Hypothyroidism   . PE (pulmonary thromboembolism) ([place])   . Renal disorder    cyst on kidney   . Stroke ([place])   . Vertigo    Past Surgical History:  Procedure Laterality Date  . ABDOMINAL HYSTERECTOMY    . COLON SURGERY  07/2009   right hemicolectomy, no residual colon cancer on path  . COLONOSCOPY  07/16/2010   [id number] POLYP-TCS 3 YEARS  . COLONOSCOPY N/A 08/02/2013   hyperplastic polyps, surveillance in 2020 if benefits outweight the risks  . COLONOSCOPY  06/2009   1.2 cm sessile cecal polyp which had adenocarcinoma arising in a tubular adenoma.  Marland Kitchen PARTIAL THYMECTOMY    . partial thyroidectomy     benign tumors    SOCIAL HISTORY:  Social History   Socioeconomic History  . Marital status: Widowed    Spouse name: Not on file  . Number of children: 2  . Years of education: Not on file  . Highest education level: Not on file  Occupational History  . Occupation: Psychologist, occupational at [employer]  . Occupation: retired from [employer]  Tobacco Use  . Smoking status: Never Smoker  . Smokeless tobacco: Never Used  Vaping Use  . Vaping Use: Never used  Substance and Sexual Activity  . Alcohol use: No    Alcohol/week: 0.0 standard drinks  . Drug use: No  . Sexual activity: Not Currently  Other Topics Concern  . Not on file  Social History Narrative  . Not on file   Social Determinants of Health   Financial Resource Strain: Not on file  Food Insecurity: Not on file  Transportation Needs: Not on file  Physical Activity: Not on file  Stress: Not on file  Social Connections: Not on file  Intimate Partner Violence: Not on file    FAMILY HISTORY:  Family History  Problem Relation Age of Onset  . Colon cancer Mother        >age60  . Arthritis Mother   . Cancer Mother   . Heart disease Mother   . Hyperlipidemia Mother   . Hypertension Mother   . Heart attack Father   . Heart  disease Father   . Diabetes Maternal Aunt   . Hyperlipidemia Daughter   . Hypertension Daughter   . Liver disease Neg Hx     CURRENT MEDICATIONS:  Current Outpatient Medications  Medication Sig Dispense Refill  . acetaminophen (TYLENOL) 500 MG tablet Take 1,000 mg by mouth every 8 (eight) hours as needed for mild pain or headache.     Marland Kitchen amLODipine (NORVASC) 5 MG tablet Take 1 tablet (5 mg total) by mouth daily. 90 tablet 3  . ASPERCREME LIDOCAINE EX Apply 1 application topically daily as needed (for knee pain).     Marland Kitchen aspirin 325 MG EC tablet Take 325 mg by mouth daily.    Marland Kitchen atorvastatin (LIPITOR) 40 MG tablet TAKE 2 TABLETS BY MOUTH ONCE A DAY. 180 tablet 0  . cholecalciferol (VITAMIN D3) 25 MCG (1000 UT) tablet Take 1,000 Units by mouth daily.     . fluocinonide cream (LIDEX) 3.41 % Apply 1 application topically 2 (two) times daily as needed (swelling, itching, and redness of skin).    Marland Kitchen gabapentin (NEURONTIN) 300 MG capsule Take 300 mg by mouth 3 (three) times daily.    Marland Kitchen linaclotide (LINZESS) 72 MCG capsule Take 1 capsule (72 mcg total) by mouth daily as needed (for constipation). 90 capsule 2  . loratadine (CLARITIN) 10 MG tablet Take 10 mg by mouth daily.     . metoprolol tartrate (LOPRESSOR) 25 MG tablet Take 37.5 mg (1 1/2 tablets) am and 50 mg (2 tablets) pm, May take an additional 25 mg daily for palpitations 360 tablet 3  . RESTASIS 0.05 % ophthalmic emulsion Place 1 drop into both eyes 2 (two) times daily as needed (chronic dry eye).    . topiramate (TOPAMAX) 25 MG tablet Take 25 mg by mouth 2 (two) times daily.    . traMADol (ULTRAM) 50 MG tablet Take 25 mg by mouth every 8 (eight) hours as needed for moderate pain.  (Patient not taking: Reported on 06/23/2020)     No current facility-administered medications for this visit.    ALLERGIES:  Allergies  Allergen Reactions  . Tape Rash    Zio monitor adhesive causes ulcerated and infected skin .Had to see derm    PHYSICAL  EXAM:  Performance status (ECOG): 2 - Symptomatic, <50% confined to bed  There were no vitals filed for this visit. Wt Readings from Last 3 Encounters:  06/23/20 163 lb (73.9 kg)  05/28/20 161 lb (73 kg)  04/10/20 161 lb (73 kg)   Physical Exam Vitals reviewed.  Constitutional:      Appearance: Normal appearance.  Cardiovascular:     Rate and Rhythm: Normal rate and regular rhythm.     Pulses: Normal pulses.  Heart sounds: Normal heart sounds.  Pulmonary:     Effort: Pulmonary effort is normal.     Breath sounds: Normal breath sounds.  Musculoskeletal:     Right lower leg: Edema (trace) present.     Left lower leg: Edema (trace) present.  Neurological:     General: No focal deficit present.     Mental Status: She is alert and oriented to person, place, and time.  Psychiatric:        Mood and Affect: Mood normal.        Behavior: Behavior normal.     LABORATORY DATA:  I have reviewed the labs as listed.  CBC Latest Ref Rng & Units 08/03/2020 05/28/2020 01/28/2020  WBC 4.0 - 10.5 K/uL 4.7 4.6 3.6(L)  Hemoglobin 12.0 - 15.0 g/dL 13.3 14.3 13.4  Hematocrit 36.0 - 46.0 % 42.3 44.8 43.2  Platelets 150 - 400 K/uL 257 302 272   CMP Latest Ref Rng & Units 08/03/2020 05/28/2020 01/28/2020  Glucose 70 - 99 mg/dL 101(H) 94 95  BUN 8 - 23 mg/dL 18 18 17   Creatinine 0.44 - 1.00 mg/dL 1.30(H) 1.15(H) 1.38(H)  Sodium 135 - 145 mmol/L 140 140 140  Potassium 3.5 - 5.1 mmol/L 3.8 3.6 3.5  Chloride 98 - 111 mmol/L 109 104 103  CO2 22 - 32 mmol/L 25 28 29   Calcium 8.9 - 10.3 mg/dL 9.2 9.5 9.3  Total Protein 6.5 - 8.1 g/dL 7.5 8.7(H) 7.5  Total Bilirubin 0.3 - 1.2 mg/dL 0.7 0.9 0.7  Alkaline Phos 38 - 126 U/L 103 112 108  AST 15 - 41 U/L 20 23 24   ALT 0 - 44 U/L 20 20 24       Component Value Date/Time   RBC 4.17 08/03/2020 1122   MCV 101.4 (H) 08/03/2020 1122   MCV 93.8 04/16/2015 0812   MCH 31.9 08/03/2020 1122   MCHC 31.4 08/03/2020 1122   RDW 15.0 08/03/2020 1122   RDW 15.0  (H) 04/16/2015 0812   LYMPHSABS 1.5 08/03/2020 1122   LYMPHSABS 1.9 04/16/2015 0812   MONOABS 0.5 08/03/2020 1122   MONOABS 0.4 04/16/2015 0812   EOSABS 0.3 08/03/2020 1122   EOSABS 0.2 04/16/2015 0812   BASOSABS 0.0 08/03/2020 1122   BASOSABS 0.1 04/16/2015 0938    DIAGNOSTIC IMAGING:  I have independently reviewed the scans and discussed with the patient. No results found.   ASSESSMENT:  1. Unprovoked pulmonary embolism: -CT PE protocol on 04/26/2018 showed bilateral pulmonary emboli. -She was treated with Eliquis until her hospitalization on 11/25/2018 when she was diagnosed with a 4 mm frontal hemorrhage. -Eliquis was discontinued. She started taking aspirin 325 mg in December 2020. -She had multiple lower extremity Dopplers which were negative for DVT.  2. Colon cancer: -Cecal polypectomy on 06/29/2009 with adenocarcinoma arising in a tubular adenoma. -Right colon segmental resection with 0/25 lymph nodes positive and no residual cancer on 07/31/2009. -Last colonoscopy on 08/02/2013 showed hyperplastic polyp in the rectum and sigmoid colon. -CT of the abdomen and pelvis on 05/28/2018 did not show any evidence of malignancy. She did not have any significant weight loss.   PLAN:  1. Unprovoked pulmonary embolism: -She does not report any leg swellings, pain, pleuritic chest pains or shortness of breath. - Oxygen saturations are 99% with respiratory rate of 18. - Lower extremities are similar in size with trace edema bilaterally with no erythema.  She never had DVT in the past. - No active signs of pulmonary embolism at this  time.  We reviewed labs from 08/03/2020.  D-dimer continues to be elevated at 3.31, up from 3.0 last time from 6 months. - She was instructed to go to the ER if there are any above-described signs or symptoms.  Patient and her daughter are agreeable with the plan. - RTC 6 months with follow-up labs including D-dimer.  Orders placed this encounter:  No  orders of the defined types were placed in this encounter.    Derek Jack, MD [place] [phone]   I, Thana Ates, am acting as a scribe for Dr. Derek Jack.  I, Derek Jack MD, have reviewed the above documentation for accuracy and completeness, and I agree with the above.

## 2020-08-11 ENCOUNTER — Inpatient Hospital Stay (HOSPITAL_BASED_OUTPATIENT_CLINIC_OR_DEPARTMENT_OTHER): Payer: Medicare Other | Admitting: Hematology

## 2020-08-11 ENCOUNTER — Other Ambulatory Visit: Payer: Self-pay

## 2020-08-11 VITALS — BP 157/71 | HR 68 | Temp 97.0°F | Resp 18 | Wt 160.5 lb

## 2020-08-11 DIAGNOSIS — C188 Malignant neoplasm of overlapping sites of colon: Secondary | ICD-10-CM | POA: Diagnosis not present

## 2020-08-11 DIAGNOSIS — I2699 Other pulmonary embolism without acute cor pulmonale: Secondary | ICD-10-CM | POA: Diagnosis not present

## 2020-08-11 DIAGNOSIS — Z9049 Acquired absence of other specified parts of digestive tract: Secondary | ICD-10-CM | POA: Diagnosis not present

## 2020-08-11 DIAGNOSIS — Z86711 Personal history of pulmonary embolism: Secondary | ICD-10-CM | POA: Diagnosis not present

## 2020-08-11 DIAGNOSIS — Z85038 Personal history of other malignant neoplasm of large intestine: Secondary | ICD-10-CM | POA: Diagnosis not present

## 2020-08-11 DIAGNOSIS — Z7982 Long term (current) use of aspirin: Secondary | ICD-10-CM | POA: Diagnosis not present

## 2020-08-11 NOTE — Patient Instructions (Signed)
[facility] at [facility] Discharge Instructions  You were seen today by Dr. Katragadda. He went over your recent results. Dr. Katragadda will see you back in 6 months for labs and follow up.   Thank you for choosing [facility] at [facility] to provide your oncology and hematology care.  To afford each patient quality time with our provider, please arrive at least 15 minutes before your scheduled appointment time.   If you have a lab appointment with the Cancer Center please come in thru the Main Entrance and check in at the main information desk  You need to re-schedule your appointment should you arrive 10 or more minutes late.  We strive to give you quality time with our providers, and arriving late affects you and other patients whose appointments are after yours.  Also, if you no show three or more times for appointments you may be dismissed from the clinic at the providers discretion.     Again, thank you for choosing [facility].  Our hope is that these requests will decrease the amount of time that you wait before being seen by our physicians.       _____________________________________________________________  Should you have questions after your visit to [facility], please contact our office at [phone] between the hours of 8:00 a.m. and 4:30 p.m.  Voicemails left after 4:00 p.m. will not be returned until the following business day.  For prescription refill requests, have your pharmacy contact our office and allow 72 hours.    Cancer Center Support Programs:   > Cancer Support Group  2nd Tuesday of the month 1pm-2pm, Journey Room    

## 2020-08-12 ENCOUNTER — Encounter: Payer: Self-pay | Admitting: Internal Medicine

## 2020-08-12 ENCOUNTER — Ambulatory Visit (INDEPENDENT_AMBULATORY_CARE_PROVIDER_SITE_OTHER): Payer: Medicare Other | Admitting: Internal Medicine

## 2020-08-12 VITALS — BP 144/80 | HR 73 | Temp 97.6°F | Ht 63.0 in | Wt 162.0 lb

## 2020-08-12 DIAGNOSIS — I10 Essential (primary) hypertension: Secondary | ICD-10-CM | POA: Diagnosis not present

## 2020-08-12 DIAGNOSIS — E039 Hypothyroidism, unspecified: Secondary | ICD-10-CM

## 2020-08-12 DIAGNOSIS — Z85038 Personal history of other malignant neoplasm of large intestine: Secondary | ICD-10-CM | POA: Diagnosis not present

## 2020-08-12 DIAGNOSIS — K5901 Slow transit constipation: Secondary | ICD-10-CM

## 2020-08-12 DIAGNOSIS — E782 Mixed hyperlipidemia: Secondary | ICD-10-CM

## 2020-08-12 DIAGNOSIS — Z86711 Personal history of pulmonary embolism: Secondary | ICD-10-CM | POA: Diagnosis not present

## 2020-08-12 DIAGNOSIS — R7303 Prediabetes: Secondary | ICD-10-CM

## 2020-08-12 DIAGNOSIS — E119 Type 2 diabetes mellitus without complications: Secondary | ICD-10-CM

## 2020-08-12 DIAGNOSIS — R519 Headache, unspecified: Secondary | ICD-10-CM

## 2020-08-12 DIAGNOSIS — N1832 Chronic kidney disease, stage 3b: Secondary | ICD-10-CM

## 2020-08-12 DIAGNOSIS — G8194 Hemiplegia, unspecified affecting left nondominant side: Secondary | ICD-10-CM

## 2020-08-12 DIAGNOSIS — S60561A Insect bite (nonvenomous) of right hand, initial encounter: Secondary | ICD-10-CM | POA: Diagnosis not present

## 2020-08-12 DIAGNOSIS — Z7689 Persons encountering health services in other specified circumstances: Secondary | ICD-10-CM

## 2020-08-12 DIAGNOSIS — I493 Ventricular premature depolarization: Secondary | ICD-10-CM

## 2020-08-12 DIAGNOSIS — W57XXXA Bitten or stung by nonvenomous insect and other nonvenomous arthropods, initial encounter: Secondary | ICD-10-CM

## 2020-08-12 MED ORDER — ATORVASTATIN CALCIUM 80 MG PO TABS: 80.0000 mg | ORAL_TABLET | Freq: Every day | ORAL | 3 refills | Status: DC

## 2020-08-12 NOTE — Assessment & Plan Note (Signed)
Last BMP reviewed F/u with Nephrology

## 2020-08-12 NOTE — Progress Notes (Signed)
New Patient Office Visit  Subjective:  Patient ID: [name], female    DOB: 1935/02/16  Age: 86 y.o. MRN: [id number]  CC:  Chief Complaint  Patient presents with  . Rash    Rash on R hand/maybe bug bite? Ongoing for a few days, itches some.  . New Patient (Initial Visit)    Here to establish care. Needs referral to [facility] for Neurology.    HPI [name] is a 86 year old female with PMH of HTN, prediabetes, CVA with residual left sides numbness and weakness, ?partial seizure, hypothyroidism, colon ca. s/p right hemicolectomy, chronic constipation, PE and CKD stage 3 who presents for establishing care.  She is doing well overall. Her BP was elevated initially, but was found to be high-normal for her age later during the visit. She denies any headache, dizziness, chest pain, dyspnea or palpitations currently.  She follows up with Dr Merlene Laughter for h/o TIAs, ?partial seizure and headache.  She follows up with Dr Theador Hawthorne for CKD stage 3.  She follows up with Heme/Onc for h/o unprovoked PE. Denies any leg swelling, dyspnea or palpitations currently. She was on Eliquis in the past, but had intracranial bleeding while on it. She is not on [facility] currently.  She has had 3 doses of COVID vaccine. She has had Shingles vaccine, but has not had Shingrix.   Past Medical History:  Diagnosis Date  . Allergy   . Arthritis   . Colon cancer ([place])    colon ca dx 07/30/09  . History of cardiac monitoring 07/2017   "Event monitor demonstrated sinus rhythm with isolated PACs and no arrhythmias"  . History of colon cancer 06/2009   found at time of TCS 06/29/09, 1.2cm sessile cecal polyp, no adjuvent therapy needed  . HTN (hypertension)   . Hx of cardiovascular stress test 07/2017   "No diagnostic ST segment changes to indicate ischemia. Small, moderate intensity, reversible apical to basal inferolateral defect consistent with ischemia. This is a low risk study. Nuclear stress  EF: 84%."  . Hyperlipidemia   . Hypothyroidism   . PE (pulmonary thromboembolism) ([place])   . Renal disorder    cyst on kidney   . Silent micro-hemorrhage of brain ([place]) 11/25/2018  . Stroke ([place])   . TIA (transient ischemic attack) 11/25/2018  . Vertigo     Past Surgical History:  Procedure Laterality Date  . ABDOMINAL HYSTERECTOMY    . COLON SURGERY  07/2009   right hemicolectomy, no residual colon cancer on path  . COLONOSCOPY  07/16/2010   [id number] POLYP-TCS 3 YEARS  . COLONOSCOPY N/A 08/02/2013   hyperplastic polyps, surveillance in 2020 if benefits outweight the risks  . COLONOSCOPY  06/2009   1.2 cm sessile cecal polyp which had adenocarcinoma arising in a tubular adenoma.  Marland Kitchen PARTIAL THYMECTOMY    . partial thyroidectomy     benign tumors    Family History  Problem Relation Age of Onset  . Colon cancer Mother        >age60  . Arthritis Mother   . Cancer Mother   . Heart disease Mother   . Hyperlipidemia Mother   . Hypertension Mother   . Heart attack Father   . Heart disease Father   . Diabetes Maternal Aunt   . Hyperlipidemia Daughter   . Hypertension Daughter   . Liver disease Neg Hx     Social History   Socioeconomic History  . Marital status: Widowed    Spouse name:  Not on file  . Number of children: 2  . Years of education: Not on file  . Highest education level: Not on file  Occupational History  . 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 Occupation: retired from Designer, fashion/clothing  Tobacco Use  . Smoking status: Never Smoker  . Smokeless tobacco: Never Used  Vaping Use  . Vaping Use: Never used  Substance and Sexual Activity  . Alcohol use: No    Alcohol/week: 0.0 standard drinks  . Drug use: No  . Sexual activity: Not Currently  Other Topics Concern  . Not on file  Social History Narrative  . Not on file   Social Determinants of Health   Financial Resource Strain: Not on file  Food Insecurity: Not on file  Transportation  Needs: Not on file  Physical Activity: Not on file  Stress: Not on file  Social Connections: Not on file  Intimate Partner Violence: Not on file    ROS Review of Systems  Constitutional: Negative for chills and fever.  HENT: Negative for congestion, sinus pressure, sinus pain and sore throat.   Eyes: Negative for pain and discharge.  Respiratory: Negative for cough and shortness of breath.   Cardiovascular: Negative for chest pain and palpitations.  Gastrointestinal: Negative for abdominal pain, constipation, diarrhea, nausea and vomiting.  Endocrine: Negative for polydipsia and polyuria.  Genitourinary: Negative for dysuria and hematuria.  Musculoskeletal: Positive for arthralgias. Negative for neck pain and neck stiffness.  Skin: Positive for color change.  Neurological: Negative for dizziness and weakness.  Psychiatric/Behavioral: Negative for agitation and behavioral problems.    Objective:   Today's Vitals: BP (!) 144/80 (BP Location: Left Arm, Cuff Size: Normal)   Pulse 73   Temp 97.6 F (36.4 C) (Temporal)   Ht 5\' 3"  (1.6 m)   Wt 162 lb (73.5 kg)   SpO2 97%   BMI 28.70 kg/m   Physical Exam Vitals reviewed.  Constitutional:      General: She is not in acute distress.    Appearance: She is not diaphoretic.  HENT:     Head: Normocephalic and atraumatic.     Nose: Nose normal.     Mouth/Throat:     Mouth: Mucous membranes are moist.  Eyes:     General: No scleral icterus.    Extraocular Movements: Extraocular movements intact.  Cardiovascular:     Rate and Rhythm: Normal rate and regular rhythm.     Pulses: Normal pulses.     Heart sounds: Normal heart sounds. No murmur heard.   Pulmonary:     Breath sounds: Normal breath sounds. No wheezing or rales.  Abdominal:     Palpations: Abdomen is soft.     Tenderness: There is no abdominal tenderness.  Musculoskeletal:     Cervical back: Neck supple. No tenderness.     Right lower leg: No edema.     Left  lower leg: No edema.  Skin:    General: Skin is warm.     Findings: Erythema (Over right hand - dorsal aspect, no warmth or tenderness) present.  Neurological:     General: No focal deficit present.     Mental Status: She is alert and oriented to person, place, and time.     Sensory: Sensory deficit (Left UE) present.     Motor: Weakness (Left UE and LE - muscle strength 4/5) present.  Psychiatric:        Mood and Affect: Mood normal.  Behavior: Behavior normal.     Assessment & Plan:   Problem List Items Addressed This Visit      Encounter to establish care    -  Primary Care established Previous chart reviewed History and medications reviewed with the patient  Cardiovascular and Mediastinum   Essential hypertension    BP Readings from Last 1 Encounters:  08/12/20 (!) 144/80   Overall well-controlled with Amlodipine and Metoprolol Counseled for compliance with the medications Advised DASH diet and moderate exercise/walking as tolerated      Relevant Medications   atorvastatin (LIPITOR) 80 MG tablet   Other Relevant Orders   CBC   CMP14+EGFR   TSH   PVC (premature ventricular contraction)    Episodes of palpitations, now better with Metoprolol      Relevant Medications   atorvastatin (LIPITOR) 80 MG tablet     Endocrine   Hypothyroidism    Lab Results  Component Value Date   TSH 0.36 (L) 09/20/2019   On Levothyroxine Check TSH and free T4        Nervous and Auditory   Left hemiparesis ([place])    H/o multiple TIAs On Aspirin and statin F/u with Neurology      Relevant Orders   Ambulatory referral to Neurology     Genitourinary   CKD (chronic kidney disease), stage III ([place])    Last BMP reviewed F/u with Nephrology        Other   Constipation    Well-controlled with Linzess      History of colon cancer    In 2011, s/p right hemicolectomy      History of pulmonary embolism    Unprovoked, was on Eliquis, but had intracranial  bleeding with it Currently not on [facility] F/u with Heme/Onc.      Hyperlipemia    On Lipitor      Relevant Medications   atorvastatin (LIPITOR) 80 MG tablet   Prediabetes    Lab Results  Component Value Date   HGBA1C 5.7 (H) 09/20/2019   Diet controlled      Headache    F/u with Neurology On Topiramate       Other Visit Diagnoses       Insect bite of right hand, initial encounter Improving Localized redness over right hand, no rash Better with Cortisone cream          Outpatient Encounter Medications as of 08/12/2020  Medication Sig  . acetaminophen (TYLENOL) 500 MG tablet Take 1,000 mg by mouth every 8 (eight) hours as needed for mild pain or headache.  Marland Kitchen amLODipine (NORVASC) 5 MG tablet Take 1 tablet (5 mg total) by mouth daily.  . ASPERCREME LIDOCAINE EX Apply 1 application topically daily as needed (for knee pain).   Marland Kitchen aspirin 325 MG EC tablet Take 325 mg by mouth daily.  Marland Kitchen atorvastatin (LIPITOR) 80 MG tablet Take 1 tablet (80 mg total) by mouth daily.  . cholecalciferol (VITAMIN D3) 25 MCG (1000 UT) tablet Take 1,000 Units by mouth daily.   Marland Kitchen gabapentin (NEURONTIN) 300 MG capsule Take 300 mg by mouth 3 (three) times daily.  Marland Kitchen linaclotide (LINZESS) 72 MCG capsule Take 1 capsule (72 mcg total) by mouth daily as needed (for constipation).  Marland Kitchen loratadine (CLARITIN) 10 MG tablet Take 10 mg by mouth daily.   . metoprolol tartrate (LOPRESSOR) 25 MG tablet Take 37.5 mg (1 1/2 tablets) am and 50 mg (2 tablets) pm, May take an additional 25 mg daily for palpitations  .  RESTASIS 0.05 % ophthalmic emulsion Place 1 drop into both eyes 2 (two) times daily as needed (chronic dry eye).  . topiramate (TOPAMAX) 25 MG tablet Take 50 mg by mouth 2 (two) times daily.  . traMADol (ULTRAM) 50 MG tablet Take 25 mg by mouth every 8 (eight) hours as needed for moderate pain.  . [DISCONTINUED] atorvastatin (LIPITOR) 40 MG tablet TAKE 2 TABLETS BY MOUTH ONCE A DAY.  . [DISCONTINUED] fluocinonide  cream (LIDEX) 4.26 % Apply 1 application topically 2 (two) times daily as needed (swelling, itching, and redness of skin). (Patient not taking: Reported on 08/12/2020)   No facility-administered encounter medications on file as of 08/12/2020.    Follow-up: Return in about 4 months (around 12/12/2020).   Lindell Spar, MD

## 2020-08-12 NOTE — Assessment & Plan Note (Signed)
On Lipitor 

## 2020-08-12 NOTE — Assessment & Plan Note (Signed)
F/u with Neurology On Topiramate

## 2020-08-12 NOTE — Assessment & Plan Note (Signed)
In 2011, s/p right hemicolectomy

## 2020-08-12 NOTE — Patient Instructions (Signed)
Please continue taking medications as prescribed.  Please get fasting blood tests done before the next visit.  Please continue to follow low salt diet and ambulate as tolerated.

## 2020-08-12 NOTE — Assessment & Plan Note (Signed)
Lab Results  Component Value Date   HGBA1C 5.7 (H) 09/20/2019   Diet controlled

## 2020-08-12 NOTE — Assessment & Plan Note (Signed)
BP Readings from Last 1 Encounters:  08/12/20 (!) 144/80   Overall well-controlled with Amlodipine and Metoprolol Counseled for compliance with the medications Advised DASH diet and moderate exercise/walking as tolerated

## 2020-08-12 NOTE — Assessment & Plan Note (Signed)
Well controlled with Linzess 

## 2020-08-12 NOTE — Assessment & Plan Note (Signed)
H/o multiple TIAs On Aspirin and statin F/u with Neurology

## 2020-08-12 NOTE — Assessment & Plan Note (Signed)
Episodes of palpitations, now better with Metoprolol

## 2020-08-12 NOTE — Assessment & Plan Note (Signed)
Unprovoked, was on Eliquis, but had intracranial bleeding with it Currently not on [facility] F/u with Heme/Onc.

## 2020-08-12 NOTE — Assessment & Plan Note (Signed)
Lab Results  Component Value Date   TSH 0.36 (L) 09/20/2019   On Levothyroxine Check TSH and free T4

## 2020-08-15 ENCOUNTER — Other Ambulatory Visit: Payer: Self-pay

## 2020-08-15 ENCOUNTER — Emergency Department (HOSPITAL_COMMUNITY)
Admission: EM | Admit: 2020-08-15 | Discharge: 2020-08-15 | Disposition: A | Discharge status: Home / Self Care | Payer: Medicare Other | Attending: Emergency Medicine | Admitting: Emergency Medicine

## 2020-08-15 ENCOUNTER — Encounter (HOSPITAL_COMMUNITY): Payer: Self-pay | Admitting: *Deleted

## 2020-08-15 ENCOUNTER — Emergency Department (HOSPITAL_COMMUNITY): Payer: Medicare Other

## 2020-08-15 DIAGNOSIS — Z79899 Other long term (current) drug therapy: Secondary | ICD-10-CM | POA: Diagnosis not present

## 2020-08-15 DIAGNOSIS — N183 Chronic kidney disease, stage 3 unspecified: Secondary | ICD-10-CM | POA: Insufficient documentation

## 2020-08-15 DIAGNOSIS — J9811 Atelectasis: Secondary | ICD-10-CM | POA: Diagnosis not present

## 2020-08-15 DIAGNOSIS — R531 Weakness: Secondary | ICD-10-CM | POA: Insufficient documentation

## 2020-08-15 DIAGNOSIS — Z7982 Long term (current) use of aspirin: Secondary | ICD-10-CM | POA: Diagnosis not present

## 2020-08-15 DIAGNOSIS — E039 Hypothyroidism, unspecified: Secondary | ICD-10-CM | POA: Insufficient documentation

## 2020-08-15 DIAGNOSIS — R200 Anesthesia of skin: Secondary | ICD-10-CM | POA: Diagnosis not present

## 2020-08-15 DIAGNOSIS — Z85038 Personal history of other malignant neoplasm of large intestine: Secondary | ICD-10-CM | POA: Insufficient documentation

## 2020-08-15 DIAGNOSIS — I517 Cardiomegaly: Secondary | ICD-10-CM | POA: Diagnosis not present

## 2020-08-15 DIAGNOSIS — I6529 Occlusion and stenosis of unspecified carotid artery: Secondary | ICD-10-CM | POA: Diagnosis not present

## 2020-08-15 DIAGNOSIS — I129 Hypertensive chronic kidney disease with stage 1 through stage 4 chronic kidney disease, or unspecified chronic kidney disease: Secondary | ICD-10-CM | POA: Insufficient documentation

## 2020-08-15 DIAGNOSIS — M7989 Other specified soft tissue disorders: Secondary | ICD-10-CM | POA: Diagnosis not present

## 2020-08-15 DIAGNOSIS — I10 Essential (primary) hypertension: Secondary | ICD-10-CM | POA: Diagnosis not present

## 2020-08-15 DIAGNOSIS — R202 Paresthesia of skin: Secondary | ICD-10-CM | POA: Insufficient documentation

## 2020-08-15 LAB — CBC WITH DIFFERENTIAL/PLATELET
Abs Immature Granulocytes: 0.01 10*3/uL (ref 0.00–0.07)
Basophils Absolute: 0.1 10*3/uL (ref 0.0–0.1)
Basophils Relative: 1 %
Eosinophils Absolute: 0.3 10*3/uL (ref 0.0–0.5)
Eosinophils Relative: 6 %
HCT: 43.3 % (ref 36.0–46.0)
Hemoglobin: 13.9 g/dL (ref 12.0–15.0)
Immature Granulocytes: 0 %
Lymphocytes Relative: 33 %
Lymphs Abs: 1.6 10*3/uL (ref 0.7–4.0)
MCH: 32.2 pg (ref 26.0–34.0)
MCHC: 32.1 g/dL (ref 30.0–36.0)
MCV: 100.2 fL — ABNORMAL HIGH (ref 80.0–100.0)
Monocytes Absolute: 0.5 10*3/uL (ref 0.1–1.0)
Monocytes Relative: 10 %
Neutro Abs: 2.6 10*3/uL (ref 1.7–7.7)
Neutrophils Relative %: 50 %
Platelets: 286 10*3/uL (ref 150–400)
RBC: 4.32 MIL/uL (ref 3.87–5.11)
RDW: 14.6 % (ref 11.5–15.5)
WBC: 5.1 10*3/uL (ref 4.0–10.5)
nRBC: 0 % (ref 0.0–0.2)

## 2020-08-15 LAB — BASIC METABOLIC PANEL
Anion gap: 5 (ref 5–15)
BUN: 16 mg/dL (ref 8–23)
CO2: 26 mmol/L (ref 22–32)
Calcium: 9.6 mg/dL (ref 8.9–10.3)
Chloride: 111 mmol/L (ref 98–111)
Creatinine, Ser: 1.19 mg/dL — ABNORMAL HIGH (ref 0.44–1.00)
GFR, Estimated: 45 mL/min — ABNORMAL LOW (ref >60)
Glucose, Bld: 81 mg/dL (ref 70–99)
Potassium: 4.2 mmol/L (ref 3.5–5.1)
Sodium: 142 mmol/L (ref 135–145)

## 2020-08-15 LAB — URINALYSIS, ROUTINE W REFLEX MICROSCOPIC
Bilirubin Urine: NEGATIVE
Glucose, UA: NEGATIVE mg/dL
Hgb urine dipstick: NEGATIVE
Ketones, ur: NEGATIVE mg/dL
Leukocytes,Ua: NEGATIVE
Nitrite: NEGATIVE
Protein, ur: NEGATIVE mg/dL
Specific Gravity, Urine: 1.005 (ref 1.005–1.030)
pH: 7 (ref 5.0–8.0)

## 2020-08-15 LAB — MAGNESIUM: Magnesium: 2.2 mg/dL (ref 1.7–2.4)

## 2020-08-15 NOTE — ED Provider Notes (Signed)
[facility] Provider Note   CSN: [id number] Arrival date & time: 08/15/20  1109     History No chief complaint on file.   [name] is a 86 y.o. female.  HPI      [name] is a 86 y.o. female with past medical history of colon cancer, stage II CKD, hypertension, hypothyroidism, prior stroke, and PE who is no longer anticoagulated.  She presents to the Emergency Department complaining of swelling and weakness of her left lower leg and foot.  She noticed swelling of her foot 2 days ago that progressed to her lower leg.  She elevated her leg on pillows and swelling seems to have resolved.  She describes having some weakness of her left leg, but also reports history of prior stroke.  She also notes having a tingling sensation of her left arm that has been present for several weeks and states her neurologist is aware and recommended vit C which she just started taking.  She denies chest pain, shortness of breath, headache or visual changes.  No slurred speech.  Daughter is concerned about electrolyte abnormality and possibility of DVT  Past Medical History:  Diagnosis Date  . Allergy   . Arthritis   . Colon cancer ([place])    colon ca dx 07/30/09  . History of cardiac monitoring 07/2017   "Event monitor demonstrated sinus rhythm with isolated PACs and no arrhythmias"  . History of colon cancer 06/2009   found at time of TCS 06/29/09, 1.2cm sessile cecal polyp, no adjuvent therapy needed  . HTN (hypertension)   . Hx of cardiovascular stress test 07/2017   "No diagnostic ST segment changes to indicate ischemia. Small, moderate intensity, reversible apical to basal inferolateral defect consistent with ischemia. This is a low risk study. Nuclear stress EF: 84%."  . Hyperlipidemia   . Hypothyroidism   . PE (pulmonary thromboembolism) ([place])   . Renal disorder    cyst on kidney   . Silent micro-hemorrhage of brain ([place]) 11/25/2018  . Stroke ([place])    . TIA (transient ischemic attack) 11/25/2018  . Vertigo     Patient Active Problem List   Diagnosis Date Noted  . Headache 08/12/2020  . Prediabetes 03/13/2019  . Complex partial seizure ([place]) 03/02/2019  . Left hemiparesis ([place]) 03/01/2019  . PVC (premature ventricular contraction) 11/16/2018  . DDD (degenerative disc disease), cervical 08/16/2018  . Hyperlipemia 08/16/2018  . History of pulmonary embolism 04/26/2018  . Insomnia 12/15/2015  . Paresthesia of left upper and lower extremity 11/21/2015  . Left leg weakness 11/21/2015  . Essential hypertension 11/02/2015  . Hypothyroidism 11/02/2015  . CKD (chronic kidney disease), stage III ([place]) 11/02/2015  . Loss of weight 11/02/2015  . OA (osteoarthritis) of knee 11/02/2015  . Colon cancer ([place]) 02/28/2011  . History of colon cancer 06/29/2010  . Constipation 06/10/2009    Past Surgical History:  Procedure Laterality Date  . ABDOMINAL HYSTERECTOMY    . COLON SURGERY  07/2009   right hemicolectomy, no residual colon cancer on path  . COLONOSCOPY  07/16/2010   FBP:ZWCHENIDPOEU POLYP-TCS 3 YEARS  . COLONOSCOPY N/A 08/02/2013   hyperplastic polyps, surveillance in 2020 if benefits outweight the risks  . COLONOSCOPY  06/2009   1.2 cm sessile cecal polyp which had adenocarcinoma arising in a tubular adenoma.  [facility] PARTIAL THYMECTOMY    . partial thyroidectomy     benign tumors     OB History    Gravida  3   Para  2   Term  1   Preterm  1   AB  1   Living  2     SAB  1   IAB      Ectopic      Multiple      Live Births              Family History  Problem Relation Age of Onset  . Colon cancer Mother        >age60  . Arthritis Mother   . Cancer Mother   . Heart disease Mother   . Hyperlipidemia Mother   . Hypertension Mother   . Heart attack Father   . Heart disease Father   . Diabetes Maternal Aunt   . Hyperlipidemia Daughter   . Hypertension Daughter   . Liver disease Neg Hx     Social  History   Tobacco Use  . Smoking status: Never Smoker  . Smokeless tobacco: Never Used  Vaping Use  . Vaping Use: Never used  Substance Use Topics  . Alcohol use: No    Alcohol/week: 0.0 standard drinks  . Drug use: No    Home Medications Prior to Admission medications   Medication Sig Start Date End Date Taking? Authorizing Provider  acetaminophen (TYLENOL) 500 MG tablet Take 1,000 mg by mouth every 8 (eight) hours as needed for mild pain or headache.    [provider]  amLODipine (NORVASC) 5 MG tablet Take 1 tablet (5 mg total) by mouth daily. 09/20/19   Alycia Rossetti, MD  ASPERCREME LIDOCAINE EX Apply 1 application topically daily as needed (for knee pain).     [provider]  aspirin 325 MG EC tablet Take 325 mg by mouth daily.    [provider]  atorvastatin (LIPITOR) 80 MG tablet Take 1 tablet (80 mg total) by mouth daily. 08/12/20   Lindell Spar, MD  cholecalciferol (VITAMIN D3) 25 MCG (1000 UT) tablet Take 1,000 Units by mouth daily.     [provider]  gabapentin (NEURONTIN) 300 MG capsule Take 300 mg by mouth 3 (three) times daily.    [provider]  linaclotide (LINZESS) 72 MCG capsule Take 1 capsule (72 mcg total) by mouth daily as needed (for constipation). 09/20/19   Alycia Rossetti, MD  loratadine (CLARITIN) 10 MG tablet Take 10 mg by mouth daily.     [provider]  metoprolol tartrate (LOPRESSOR) 25 MG tablet Take 37.5 mg (1 1/2 tablets) am and 50 mg (2 tablets) pm, May take an additional 25 mg daily for palpitations 06/23/20   Arnoldo Lenis, MD  RESTASIS 0.05 % ophthalmic emulsion Place 1 drop into both eyes 2 (two) times daily as needed (chronic dry eye). 05/17/10   [provider]  topiramate (TOPAMAX) 25 MG tablet Take 50 mg by mouth 2 (two) times daily. 06/02/20   [provider]  traMADol (ULTRAM) 50 MG tablet Take 25 mg by mouth every 8 (eight) hours as needed for moderate pain.  01/10/19   [provider]    Allergies    Tape  Review of Systems   Review of Systems  Constitutional: Negative for chills, fatigue and fever.  Respiratory: Negative for cough and shortness of breath.   Cardiovascular: Positive for leg swelling. Negative for chest pain and palpitations.  Gastrointestinal: Negative for abdominal pain, nausea and vomiting.  Genitourinary: Negative for dysuria, flank pain and hematuria.  Musculoskeletal: Negative  for arthralgias, back pain, myalgias, neck pain and neck stiffness.       Swelling left foot and lower leg.  Skin: Negative for rash.  Neurological: Positive for numbness ("tingling" left arm). Negative for dizziness, syncope, facial asymmetry, speech difficulty, weakness and headaches.  Hematological: Does not bruise/bleed easily.  Psychiatric/Behavioral: Negative for confusion.    Physical Exam Updated Vital Signs BP 131/73   Pulse 62   Temp 98.7 F (37.1 C) (Oral)   Resp 16   Ht 5\' 3"  (1.6 m)   Wt 73.5 kg   SpO2 97%   BMI 28.70 kg/m   Physical Exam Vitals and nursing note reviewed.  Constitutional:      General: She is not in acute distress.    Appearance: Normal appearance.  Cardiovascular:     Rate and Rhythm: Normal rate and regular rhythm.     Pulses: Normal pulses.  Pulmonary:     Effort: Pulmonary effort is normal.     Breath sounds: Normal breath sounds.  Chest:     Chest wall: No tenderness.  Abdominal:     Palpations: Abdomen is soft.     Tenderness: There is no abdominal tenderness.  Musculoskeletal:        General: No tenderness or signs of injury. Normal range of motion.     Cervical back: Normal range of motion.     Right lower leg: No edema.     Left lower leg: No edema.     Comments: Negative SLR bilaterally. Grips are strong and symmetrical.  Skin:    General: Skin is warm.     Capillary Refill: Capillary refill takes less than 2 seconds.     Findings: No bruising, erythema or rash.   Neurological:     General: No focal deficit present.     Mental Status: She is alert.     Sensory: No sensory deficit.     Motor: No weakness.     Comments: CN II-XII intact.  Speech clear.  No facial droop or pronator drift.  No lower extremity drift.       ED Results / Procedures / Treatments   Labs (all labs ordered are listed, but only abnormal results are displayed) Labs Reviewed  BASIC METABOLIC PANEL - Abnormal; Notable for the following components:      Result Value   Creatinine, Ser 1.19 (*)    GFR, Estimated 45 (*)    All other components within normal limits  CBC WITH DIFFERENTIAL/PLATELET - Abnormal; Notable for the following components:   MCV 100.2 (*)    All other components within normal limits  URINALYSIS, ROUTINE W REFLEX MICROSCOPIC - Abnormal; Notable for the following components:   Color, Urine STRAW (*)    All other components within normal limits  MAGNESIUM    EKG EKG Interpretation  Date/Time:  Saturday August 15 2020 11:55:27 EDT Ventricular Rate:  64 PR Interval:  181 QRS Duration: 58 QT Interval:  514 QTC Calculation: 531 R Axis:   -38 Text Interpretation: Sinus rhythm Left axis deviation Low voltage, precordial leads Borderline T abnormalities, diffuse leads Prolonged QT interval Confirmed by Varney Biles [phone]) on 08/15/2020 2:48:59 PM   Radiology CT Head Wo Contrast  Result Date: 08/15/2020 CLINICAL DATA:  Swelling in the left leg, intermittent numbness in the left arm. EXAM: CT HEAD WITHOUT CONTRAST TECHNIQUE: Contiguous axial images were obtained from the base of the skull through the vertex without intravenous contrast. COMPARISON:  CT head dated 05/28/2020. FINDINGS:  Brain: No evidence of acute infarction, hemorrhage, hydrocephalus, extra-axial collection or mass lesion/mass effect. There is moderate cerebral volume loss with associated ex vacuo dilatation. Periventricular white matter hypoattenuation likely represents chronic small vessel  ischemic disease. Chronic lacunar infarcts in the deep gray nuclei are redemonstrated. Vascular: There are vascular calcifications in the carotid siphons. Skull: Normal. Negative for fracture or focal lesion. Sinuses/Orbits: No acute finding. Other: None. IMPRESSION: No acute intracranial process. Electronically Signed   By: Zerita Boers M.D.   On: 08/15/2020 15:37   DG Chest Portable 1 View  Result Date: 08/15/2020 CLINICAL DATA:  Weakness EXAM: PORTABLE CHEST 1 VIEW COMPARISON:  Chest radiograph dated 07/18/2019. FINDINGS: The heart is enlarged. There is mild left basilar atelectasis. The right lung is clear. There is no pleural effusion or pneumothorax. Degenerative changes are seen in the spine. IMPRESSION: Mild left basilar atelectasis.  Cardiomegaly. Electronically Signed   By: Zerita Boers M.D.   On: 08/15/2020 14:29    Procedures Procedures   Medications Ordered in ED Medications - No data to display  ED Course  I have reviewed the triage vital signs and the nursing notes.  Pertinent labs & imaging results that were available during my care of the patient were reviewed by me and considered in my medical decision making (see chart for details).   MDM Rules/Calculators/A&P                          Patient here for symptoms of swelling of her left foot and lower leg x2 days.  No known injury.  She also reports having some weakness of her left leg at times.  Has documented history of left sided weakness and history of prior stroke.  She also mentions having paresthesias of the left arm for weeks.  Recently began taking vitamin C.  Patient's daughter is also at bedside and provides additional history information.  States that she is concerned that her mother may have an electrolyte abnormality or DVT of the leg.  States mother appears to be at her neurological baseline.  Labs interpreted by me, show no leukocytosis.  Urinalysis unremarkable.  Magnesium level also unremarkable.  She does  have some evidence of chronic kidney disease, but creatinine today is improved from baseline.  No clinical signs of dehydration.  EKG without acute ischemic changes.  CT head without acute intracranial process.  No neurological deficit on exam today.  Doubt stroke or TIA. Chest x-ray without acute finding. Patient also seen by Dr. Kathrynn Humble and care plan discussed.  Patient appears appropriate for discharge home.  I will arrange to have her return on Sunday for outpatient ultrasound of the left lower extremity.  Daughter prefers follow-up with her neurologist for this week.  Strict return precautions discussed.    Final Clinical Impression(s) / ED Diagnoses Final diagnoses:  Left leg swelling    Rx / DC Orders ED Discharge Orders    None       Kem Parkinson, PA-C 08/16/20 [place], Ankit, MD 08/17/20 [phone]

## 2020-08-15 NOTE — ED Notes (Signed)
Pt ambulated to the bathroom with a cane and one standby assist.

## 2020-08-15 NOTE — Discharge Instructions (Addendum)
You have been scheduled for an outpatient ultrasound of your left leg for tomorrow morning (08/16/2020) at 9 AM.  Please arrive 10 to 15 minutes early to register.  Please follow-up with Dr. Merlene Laughter next week for recheck.

## 2020-08-15 NOTE — ED Triage Notes (Signed)
States she had some swelling in left leg noted today, also has some intermittent numbness in left arm for quite a while

## 2020-08-16 ENCOUNTER — Ambulatory Visit (HOSPITAL_COMMUNITY)
Admission: RE | Admit: 2020-08-16 | Discharge: 2020-08-16 | Disposition: A | Discharge status: Home / Self Care | Payer: Medicare Other | Source: Ambulatory Visit | Attending: Emergency Medicine | Admitting: Emergency Medicine

## 2020-08-16 DIAGNOSIS — R600 Localized edema: Secondary | ICD-10-CM | POA: Diagnosis not present

## 2020-08-16 DIAGNOSIS — M79662 Pain in left lower leg: Secondary | ICD-10-CM | POA: Diagnosis not present

## 2020-08-16 DIAGNOSIS — Z86711 Personal history of pulmonary embolism: Secondary | ICD-10-CM | POA: Diagnosis not present

## 2020-08-16 DIAGNOSIS — M7989 Other specified soft tissue disorders: Secondary | ICD-10-CM | POA: Diagnosis not present

## 2020-08-16 NOTE — ED Provider Notes (Signed)
Pt returned today for scheduled outpatient ultrasound of her left lower extremity.  Ultrasound negative for presence of DVT.  Patient and her daughter informed of results. she will follow-up with her neurologist this week.   US Venous Img Lower Unilateral Left  Result Date: 08/16/2020 CLINICAL DATA:  Left lower extremity pain and edema. EXAM: LEFT LOWER EXTREMITY VENOUS DOPPLER ULTRASOUND TECHNIQUE: Gray-scale sonography with graded compression, as well as color Doppler and duplex ultrasound were performed to evaluate the lower extremity deep venous systems from the level of the common femoral vein and including the common femoral, femoral, profunda femoral, popliteal and calf veins including the posterior tibial, peroneal and gastrocnemius veins when visible. The superficial great saphenous vein was also interrogated. Spectral Doppler was utilized to evaluate flow at rest and with distal augmentation maneuvers in the common femoral, femoral and popliteal veins. COMPARISON:  Prior bilateral lower extremity venous duplex study on 10/30/2019 FINDINGS: Contralateral Common Femoral Vein: Respiratory phasicity is normal and symmetric with the symptomatic side. No evidence of thrombus. Normal compressibility. Common Femoral Vein: No evidence of thrombus. Normal compressibility, respiratory phasicity and response to augmentation. Saphenofemoral Junction: No evidence of thrombus. Normal compressibility and flow on color Doppler imaging. Profunda Femoral Vein: No evidence of thrombus. Normal compressibility and flow on color Doppler imaging. Femoral Vein: No evidence of thrombus. Normal compressibility, respiratory phasicity and response to augmentation. Popliteal Vein: No evidence of thrombus. Normal compressibility, respiratory phasicity and response to augmentation. Calf Veins: No evidence of thrombus. Normal compressibility and flow on color Doppler imaging. Superficial Great Saphenous Vein: No evidence of  thrombus. Normal compressibility. Venous Reflux:  None. Other Findings: No evidence of superficial thrombophlebitis or abnormal fluid collection. IMPRESSION: No evidence of left lower extremity deep venous thrombosis. Electronically Signed   By: Aletta Edouard M.D.   On: 08/16/2020 09:43       Kem Parkinson, PA-C 08/16/20 1108    Luna Fuse, MD 08/28/20 [phone]

## 2020-08-24 DIAGNOSIS — G890 Central pain syndrome: Secondary | ICD-10-CM | POA: Diagnosis not present

## 2020-08-24 DIAGNOSIS — I10 Essential (primary) hypertension: Secondary | ICD-10-CM | POA: Diagnosis not present

## 2020-08-24 DIAGNOSIS — Z79891 Long term (current) use of opiate analgesic: Secondary | ICD-10-CM | POA: Diagnosis not present

## 2020-08-24 DIAGNOSIS — G4452 New daily persistent headache (NDPH): Secondary | ICD-10-CM | POA: Diagnosis not present

## 2020-08-24 DIAGNOSIS — I679 Cerebrovascular disease, unspecified: Secondary | ICD-10-CM | POA: Diagnosis not present

## 2020-08-24 DIAGNOSIS — I6930 Unspecified sequelae of cerebral infarction: Secondary | ICD-10-CM | POA: Diagnosis not present

## 2020-09-15 DIAGNOSIS — E870 Hyperosmolality and hypernatremia: Secondary | ICD-10-CM | POA: Diagnosis not present

## 2020-09-15 DIAGNOSIS — I5032 Chronic diastolic (congestive) heart failure: Secondary | ICD-10-CM | POA: Diagnosis not present

## 2020-09-15 DIAGNOSIS — E1122 Type 2 diabetes mellitus with diabetic chronic kidney disease: Secondary | ICD-10-CM | POA: Diagnosis not present

## 2020-09-15 DIAGNOSIS — N189 Chronic kidney disease, unspecified: Secondary | ICD-10-CM | POA: Diagnosis not present

## 2020-09-15 DIAGNOSIS — I129 Hypertensive chronic kidney disease with stage 1 through stage 4 chronic kidney disease, or unspecified chronic kidney disease: Secondary | ICD-10-CM | POA: Diagnosis not present

## 2020-09-18 ENCOUNTER — Other Ambulatory Visit: Payer: Self-pay | Admitting: Family Medicine

## 2020-09-18 DIAGNOSIS — E1122 Type 2 diabetes mellitus with diabetic chronic kidney disease: Secondary | ICD-10-CM | POA: Diagnosis not present

## 2020-09-18 DIAGNOSIS — I5032 Chronic diastolic (congestive) heart failure: Secondary | ICD-10-CM | POA: Diagnosis not present

## 2020-09-18 DIAGNOSIS — N189 Chronic kidney disease, unspecified: Secondary | ICD-10-CM | POA: Diagnosis not present

## 2020-09-18 DIAGNOSIS — I129 Hypertensive chronic kidney disease with stage 1 through stage 4 chronic kidney disease, or unspecified chronic kidney disease: Secondary | ICD-10-CM | POA: Diagnosis not present

## 2020-09-18 NOTE — Telephone Encounter (Signed)
Patient has transferred care to your office.  

## 2020-09-22 ENCOUNTER — Other Ambulatory Visit: Payer: Self-pay | Admitting: *Deleted

## 2020-09-22 ENCOUNTER — Telehealth: Payer: Self-pay

## 2020-09-22 MED ORDER — AMLODIPINE BESYLATE 5 MG PO TABS: 5.0000 mg | ORAL_TABLET | Freq: Every day | ORAL | 3 refills | Status: DC

## 2020-09-22 NOTE — Telephone Encounter (Signed)
Pt medication sent to pharmacy  

## 2020-09-22 NOTE — Telephone Encounter (Signed)
Need med refill amLODipine (NORVASC) 5 MG tablet  Pharmacy: [facility]  Only has 1 pill left.

## 2020-10-05 DIAGNOSIS — M25571 Pain in right ankle and joints of right foot: Secondary | ICD-10-CM | POA: Diagnosis not present

## 2020-10-05 DIAGNOSIS — I6930 Unspecified sequelae of cerebral infarction: Secondary | ICD-10-CM | POA: Diagnosis not present

## 2020-10-05 DIAGNOSIS — G4452 New daily persistent headache (NDPH): Secondary | ICD-10-CM | POA: Diagnosis not present

## 2020-10-05 DIAGNOSIS — Z79891 Long term (current) use of opiate analgesic: Secondary | ICD-10-CM | POA: Diagnosis not present

## 2020-10-05 DIAGNOSIS — I679 Cerebrovascular disease, unspecified: Secondary | ICD-10-CM | POA: Diagnosis not present

## 2020-10-05 DIAGNOSIS — G890 Central pain syndrome: Secondary | ICD-10-CM | POA: Diagnosis not present

## 2020-10-05 DIAGNOSIS — I10 Essential (primary) hypertension: Secondary | ICD-10-CM | POA: Diagnosis not present

## 2020-10-26 ENCOUNTER — Other Ambulatory Visit: Payer: Self-pay | Admitting: Internal Medicine

## 2020-11-02 ENCOUNTER — Emergency Department (HOSPITAL_COMMUNITY): Payer: Medicare Other

## 2020-11-02 ENCOUNTER — Other Ambulatory Visit: Payer: Self-pay

## 2020-11-02 ENCOUNTER — Encounter (HOSPITAL_COMMUNITY): Payer: Self-pay

## 2020-11-02 ENCOUNTER — Emergency Department (HOSPITAL_COMMUNITY)
Admission: EM | Admit: 2020-11-02 | Discharge: 2020-11-03 | Disposition: A | Discharge status: Home / Self Care | Payer: Medicare Other | Attending: Emergency Medicine | Admitting: Emergency Medicine

## 2020-11-02 DIAGNOSIS — N183 Chronic kidney disease, stage 3 unspecified: Secondary | ICD-10-CM | POA: Diagnosis not present

## 2020-11-02 DIAGNOSIS — Z79899 Other long term (current) drug therapy: Secondary | ICD-10-CM | POA: Insufficient documentation

## 2020-11-02 DIAGNOSIS — R519 Headache, unspecified: Secondary | ICD-10-CM | POA: Insufficient documentation

## 2020-11-02 DIAGNOSIS — Z85038 Personal history of other malignant neoplasm of large intestine: Secondary | ICD-10-CM | POA: Diagnosis not present

## 2020-11-02 DIAGNOSIS — E039 Hypothyroidism, unspecified: Secondary | ICD-10-CM | POA: Diagnosis not present

## 2020-11-02 DIAGNOSIS — R42 Dizziness and giddiness: Secondary | ICD-10-CM | POA: Insufficient documentation

## 2020-11-02 DIAGNOSIS — R002 Palpitations: Secondary | ICD-10-CM | POA: Diagnosis not present

## 2020-11-02 DIAGNOSIS — I129 Hypertensive chronic kidney disease with stage 1 through stage 4 chronic kidney disease, or unspecified chronic kidney disease: Secondary | ICD-10-CM | POA: Insufficient documentation

## 2020-11-02 DIAGNOSIS — Z7982 Long term (current) use of aspirin: Secondary | ICD-10-CM | POA: Diagnosis not present

## 2020-11-02 LAB — CBC WITH DIFFERENTIAL/PLATELET
Abs Immature Granulocytes: 0.02 10*3/uL (ref 0.00–0.07)
Basophils Absolute: 0.1 10*3/uL (ref 0.0–0.1)
Basophils Relative: 1 %
Eosinophils Absolute: 0.5 10*3/uL (ref 0.0–0.5)
Eosinophils Relative: 9 %
HCT: 44.1 % (ref 36.0–46.0)
Hemoglobin: 14.4 g/dL (ref 12.0–15.0)
Immature Granulocytes: 0 %
Lymphocytes Relative: 41 %
Lymphs Abs: 2.5 10*3/uL (ref 0.7–4.0)
MCH: 32.6 pg (ref 26.0–34.0)
MCHC: 32.7 g/dL (ref 30.0–36.0)
MCV: 99.8 fL (ref 80.0–100.0)
Monocytes Absolute: 0.5 10*3/uL (ref 0.1–1.0)
Monocytes Relative: 9 %
Neutro Abs: 2.4 10*3/uL (ref 1.7–7.7)
Neutrophils Relative %: 40 %
Platelets: 280 10*3/uL (ref 150–400)
RBC: 4.42 MIL/uL (ref 3.87–5.11)
RDW: 14.6 % (ref 11.5–15.5)
WBC: 5.9 10*3/uL (ref 4.0–10.5)
nRBC: 0 % (ref 0.0–0.2)

## 2020-11-02 LAB — URINALYSIS, ROUTINE W REFLEX MICROSCOPIC
Bilirubin Urine: NEGATIVE
Glucose, UA: NEGATIVE mg/dL
Hgb urine dipstick: NEGATIVE
Ketones, ur: NEGATIVE mg/dL
Nitrite: NEGATIVE
Protein, ur: NEGATIVE mg/dL
Specific Gravity, Urine: 1.004 — ABNORMAL LOW (ref 1.005–1.030)
pH: 8 (ref 5.0–8.0)

## 2020-11-02 LAB — BASIC METABOLIC PANEL
Anion gap: 5 (ref 5–15)
BUN: 19 mg/dL (ref 8–23)
CO2: 26 mmol/L (ref 22–32)
Calcium: 9.4 mg/dL (ref 8.9–10.3)
Chloride: 110 mmol/L (ref 98–111)
Creatinine, Ser: 1.29 mg/dL — ABNORMAL HIGH (ref 0.44–1.00)
GFR, Estimated: 41 mL/min — ABNORMAL LOW (ref >60)
Glucose, Bld: 97 mg/dL (ref 70–99)
Potassium: 3.7 mmol/L (ref 3.5–5.1)
Sodium: 141 mmol/L (ref 135–145)

## 2020-11-02 MED ORDER — MECLIZINE HCL 12.5 MG PO TABS
25.0000 mg | ORAL_TABLET | Freq: Once | ORAL | Status: AC
  Administered 2020-11-02: 25 mg via ORAL
  Filled 2020-11-02: qty 2

## 2020-11-02 NOTE — ED Triage Notes (Signed)
Pt c/o palpitations, weakness, BLE swelling, dizziness, and headache starting this morning. Hx of vertigo.

## 2020-11-02 NOTE — ED Provider Notes (Signed)
[facility] EMERGENCY DEPARTMENT Provider Note   CSN: [id number] Arrival date & time: 11/02/20  1916     History Chief Complaint  Patient presents with   Multiple Complaints    [name] is a 86 y.o. female.  Patient is an 86 year old female with past medical history of hypertension, prior CVA, prior PE, and colon cancer.  Patient presents today for evaluation of headache, palpitations, and dizziness.  This apparently started earlier today.  She describes a feeling as if "the floor is rising up to her" along with palpitations.  She describes instability when she attempts to ambulate.  She denies a spinning sensation.  She does describe some pressure to the left side of her head, but denies any visual disturbances.  She denies any fevers, chills, or cough.  The history is provided by the patient.      Past Medical History:  Diagnosis Date   Allergy    Arthritis    Colon cancer ([place])    colon ca dx 07/30/09   History of cardiac monitoring 07/2017   "Event monitor demonstrated sinus rhythm with isolated PACs and no arrhythmias"   History of colon cancer 06/2009   found at time of TCS 06/29/09, 1.2cm sessile cecal polyp, no adjuvent therapy needed   HTN (hypertension)    Hx of cardiovascular stress test 07/2017   "No diagnostic ST segment changes to indicate ischemia. Small, moderate intensity, reversible apical to basal inferolateral defect consistent with ischemia. This is a low risk study. Nuclear stress EF: 84%."   Hyperlipidemia    Hypothyroidism    PE (pulmonary thromboembolism) ([place])    Renal disorder    cyst on kidney    Silent micro-hemorrhage of brain ([place]) 11/25/2018   Stroke ([place])    TIA (transient ischemic attack) 11/25/2018   Vertigo     Patient Active Problem List   Diagnosis Date Noted   Headache 08/12/2020   Prediabetes 03/13/2019   Complex partial seizure ([place]) 03/02/2019   Left hemiparesis ([place]) 03/01/2019   PVC (premature ventricular  contraction) 11/16/2018   DDD (degenerative disc disease), cervical 08/16/2018   Hyperlipemia 08/16/2018   History of pulmonary embolism 04/26/2018   Insomnia 12/15/2015   Paresthesia of left upper and lower extremity 11/21/2015   Left leg weakness 11/21/2015   Essential hypertension 11/02/2015   Hypothyroidism 11/02/2015   CKD (chronic kidney disease), stage III ([place]) 11/02/2015   Loss of weight 11/02/2015   OA (osteoarthritis) of knee 11/02/2015   Colon cancer ([place]) 02/28/2011   History of colon cancer 06/29/2010   Constipation 06/10/2009    Past Surgical History:  Procedure Laterality Date   ABDOMINAL HYSTERECTOMY     COLON SURGERY  07/2009   right hemicolectomy, no residual colon cancer on path   COLONOSCOPY  07/16/2010   [id number] POLYP-TCS 3 YEARS   COLONOSCOPY N/A 08/02/2013   hyperplastic polyps, surveillance in 2020 if benefits outweight the risks   COLONOSCOPY  06/2009   1.2 cm sessile cecal polyp which had adenocarcinoma arising in a tubular adenoma.   PARTIAL THYMECTOMY     partial thyroidectomy     benign tumors     OB History     Gravida  3   Para  2   Term  1   Preterm  1   AB  1   Living  2      SAB  1   IAB      Ectopic      Multiple  Live Births              Family History  Problem Relation Age of Onset   Colon cancer Mother        >age60   Arthritis Mother    Cancer Mother    Heart disease Mother    Hyperlipidemia Mother    Hypertension Mother    Heart attack Father    Heart disease Father    Diabetes Maternal Aunt    Hyperlipidemia Daughter    Hypertension Daughter    Liver disease Neg Hx     Social History   Tobacco Use   Smoking status: Never   Smokeless tobacco: Never  Vaping Use   Vaping Use: Never used  Substance Use Topics   Alcohol use: No    Alcohol/week: 0.0 standard drinks   Drug use: No    Home Medications Prior to Admission medications   Medication Sig Start Date End Date Taking?  Authorizing Provider  acetaminophen (TYLENOL) 500 MG tablet Take 1,000 mg by mouth every 8 (eight) hours as needed for mild pain or headache.    [provider]  amLODipine (NORVASC) 5 MG tablet Take 1 tablet (5 mg total) by mouth daily. 09/22/20   Lindell Spar, MD  ASPERCREME LIDOCAINE EX Apply 1 application topically daily as needed (for knee pain).     [provider]  aspirin 325 MG EC tablet Take 325 mg by mouth daily.    [provider]  atorvastatin (LIPITOR) 80 MG tablet Take 1 tablet (80 mg total) by mouth daily. 08/12/20   Lindell Spar, MD  cholecalciferol (VITAMIN D3) 25 MCG (1000 UT) tablet Take 1,000 Units by mouth daily.     [provider]  gabapentin (NEURONTIN) 300 MG capsule Take 300 mg by mouth 3 (three) times daily.    [provider]  LINZESS 72 MCG capsule TAKE (1) CAPSULE BY MOUTH DAILY AS NEEDED FOR CONSTIPATION. 10/26/20   Lindell Spar, MD  loratadine (CLARITIN) 10 MG tablet Take 10 mg by mouth daily.     [provider]  metoprolol tartrate (LOPRESSOR) 25 MG tablet Take 37.5 mg (1 1/2 tablets) am and 50 mg (2 tablets) pm, May take an additional 25 mg daily for palpitations 06/23/20   Arnoldo Lenis, MD  RESTASIS 0.05 % ophthalmic emulsion Place 1 drop into both eyes 2 (two) times daily as needed (chronic dry eye). 05/17/10   [provider]  topiramate (TOPAMAX) 25 MG tablet Take 50 mg by mouth 2 (two) times daily. 06/02/20   [provider]  traMADol (ULTRAM) 50 MG tablet Take 25 mg by mouth every 8 (eight) hours as needed for moderate pain. 01/10/19   [provider]    Allergies    Tape  Review of Systems   Review of Systems  All other systems reviewed and are negative.  Physical Exam Updated Vital Signs BP (!) 162/78   Pulse 65   Temp 98.5 F (36.9 C)   Resp 14   Ht '5\' 3"'$  (1.6 m)   Wt 73.5 kg   SpO2 100%   BMI 28.70 kg/m   Physical Exam Vitals and nursing note  reviewed.  Constitutional:      General: She is not in acute distress.    Appearance: She is well-developed. She is not diaphoretic.  HENT:     Head: Normocephalic and atraumatic.  Cardiovascular:     Rate and Rhythm: Normal rate and regular  rhythm.     Heart sounds: No murmur heard.   No friction rub. No gallop.  Pulmonary:     Effort: Pulmonary effort is normal. No respiratory distress.     Breath sounds: Normal breath sounds. No wheezing.  Abdominal:     General: Bowel sounds are normal. There is no distension.     Palpations: Abdomen is soft.     Tenderness: There is no abdominal tenderness.  Musculoskeletal:        General: Normal range of motion.     Cervical back: Normal range of motion and neck supple.  Skin:    General: Skin is warm and dry.  Neurological:     General: No focal deficit present.     Mental Status: She is alert and oriented to person, place, and time.     Cranial Nerves: No cranial nerve deficit.     Motor: No weakness.     Coordination: Coordination normal.    ED Results / Procedures / Treatments   Labs (all labs ordered are listed, but only abnormal results are displayed) Labs Reviewed  URINALYSIS, ROUTINE W REFLEX MICROSCOPIC  BASIC METABOLIC PANEL  CBC WITH DIFFERENTIAL/PLATELET    EKG EKG Interpretation  Date/Time:  Monday November 02 2020 22:47:14 EDT Ventricular Rate:  66 PR Interval:  193 QRS Duration: 85 QT Interval:  400 QTC Calculation: 420 R Axis:   -28 Text Interpretation: Sinus rhythm Inferior infarct, old No significant change since 08/15/2020 Confirmed by Veryl Speak [phone]) on 11/02/2020 11:07:54 PM  Radiology No results found.  Procedures Procedures   Medications Ordered in ED Medications  meclizine (ANTIVERT) tablet 25 mg (has no administration in time range)    ED Course  I have reviewed the triage vital signs and the nursing notes.  Pertinent labs & imaging results that were available during my care of the  patient were reviewed by me and considered in my medical decision making (see chart for details).    MDM Rules/Calculators/A&P  Patient presenting with dizziness and feeling off balance.  She also describes palpitations and headache.  This has been going on since earlier today.  I am uncertain as to the exact nature of her symptoms, however suspect some form of vertigo as the cause.  She is neurologically intact with no nystagmus.  Head CT is negative.  Patient given meclizine and appears very stable.  Remainder of the work-up is unremarkable including laboratory studies.  Patient seems appropriate for discharge with outpatient follow-up.  She will be given meclizine which she can take as needed and is to follow-up with primary doctor.  Final Clinical Impression(s) / ED Diagnoses Final diagnoses:  None    Rx / DC Orders ED Discharge Orders     None        Veryl Speak, MD 11/03/20 [phone]

## 2020-11-03 DIAGNOSIS — R42 Dizziness and giddiness: Secondary | ICD-10-CM | POA: Diagnosis not present

## 2020-11-03 DIAGNOSIS — R519 Headache, unspecified: Secondary | ICD-10-CM | POA: Diagnosis not present

## 2020-11-03 MED ORDER — MECLIZINE HCL 25 MG PO TABS: 25.0000 mg | ORAL_TABLET | Freq: Three times a day (TID) | ORAL | 0 refills | Status: DC | PRN

## 2020-11-03 NOTE — Discharge Instructions (Addendum)
Begin taking meclizine as prescribed.  Rest.  Follow-up with your primary doctor if symptoms are not improving in the next 3 to 4 days, and return to the ER if symptoms significantly worsen or change.

## 2020-11-03 NOTE — ED Notes (Signed)
Patient transported to CT 

## 2020-11-03 NOTE — ED Notes (Signed)
Discharge instructions discussed with pt. Pt was able to verbalize understanding with no questions at this time. Per pt request RN called daughter Loletha Carrow for ride home. Pt waiting for ride ETA 15 min.

## 2020-11-09 ENCOUNTER — Ambulatory Visit (INDEPENDENT_AMBULATORY_CARE_PROVIDER_SITE_OTHER): Payer: Medicare Other | Admitting: Internal Medicine

## 2020-11-09 ENCOUNTER — Ambulatory Visit (HOSPITAL_COMMUNITY)
Admission: RE | Admit: 2020-11-09 | Discharge: 2020-11-09 | Disposition: A | Discharge status: Home / Self Care | Payer: Medicare Other | Source: Ambulatory Visit | Attending: Internal Medicine | Admitting: Internal Medicine

## 2020-11-09 ENCOUNTER — Other Ambulatory Visit: Payer: Self-pay

## 2020-11-09 ENCOUNTER — Encounter: Payer: Self-pay | Admitting: Internal Medicine

## 2020-11-09 VITALS — BP 138/74 | HR 68 | Resp 18 | Ht 64.0 in | Wt 162.1 lb

## 2020-11-09 DIAGNOSIS — Z86711 Personal history of pulmonary embolism: Secondary | ICD-10-CM | POA: Diagnosis not present

## 2020-11-09 DIAGNOSIS — Z09 Encounter for follow-up examination after completed treatment for conditions other than malignant neoplasm: Secondary | ICD-10-CM | POA: Diagnosis not present

## 2020-11-09 DIAGNOSIS — R600 Localized edema: Secondary | ICD-10-CM | POA: Diagnosis not present

## 2020-11-09 DIAGNOSIS — M7989 Other specified soft tissue disorders: Secondary | ICD-10-CM | POA: Diagnosis not present

## 2020-11-09 DIAGNOSIS — R42 Dizziness and giddiness: Secondary | ICD-10-CM | POA: Diagnosis not present

## 2020-11-09 DIAGNOSIS — N1832 Chronic kidney disease, stage 3b: Secondary | ICD-10-CM

## 2020-11-09 NOTE — Patient Instructions (Signed)
Please keep legs elevated while sitting like in a recliner chair when possible. Please use compression stocking for leg swelling.  You are being scheduled to get [place] of left leg.  Please continue taking medications as prescribed.  Please continue to follow low salt diet.

## 2020-11-09 NOTE — Progress Notes (Signed)
Acute Office Visit  Subjective:    Patient ID: [name], female    DOB: [birth year], 86 y.o.   MRN: [id number]  Chief Complaint  Patient presents with   Leg Swelling    Pt has had swelling in both legs for about 6 weeks she did have to go [place] 11-02-20 for dizziness and palpatations was given meclizine wants to know how this will interact with other medications also pt feels like something is moving in her left calf     HPI Patient is in today for evaluation of b/l LE swelling, R > L. She also reports heavy sensation in left leg and something moving in the left calf. She has h/o unprovoked PE in the past, and her daughter is concerned about DVT. She could not continue Eliquis in the past due to intracranial bleeding.  She had an ER visit for dizziness and was prescribed Meclizine. She has not been taking it as they were concerned about possible interactions. Denies any headache, dizziness, chest pain, dyspnea or palpitations.  Past Medical History:  Diagnosis Date   Allergy    Arthritis    Colon cancer ([place])    colon ca dx 07/30/09   History of cardiac monitoring 07/2017   "Event monitor demonstrated sinus rhythm with isolated PACs and no arrhythmias"   History of colon cancer 06/2009   found at time of TCS 06/29/09, 1.2cm sessile cecal polyp, no adjuvent therapy needed   HTN (hypertension)    Hx of cardiovascular stress test 07/2017   "No diagnostic ST segment changes to indicate ischemia. Small, moderate intensity, reversible apical to basal inferolateral defect consistent with ischemia. This is a low risk study. Nuclear stress EF: 84%."   Hyperlipidemia    Hypothyroidism    PE (pulmonary thromboembolism) ([place])    Renal disorder    cyst on kidney    Silent micro-hemorrhage of brain ([place]) 11/25/2018   Stroke [facility])    TIA (transient ischemic attack) 11/25/2018   Vertigo     Past Surgical History:  Procedure Laterality Date   ABDOMINAL HYSTERECTOMY     COLON  SURGERY  07/2009   right hemicolectomy, no residual colon cancer on path   COLONOSCOPY  07/16/2010   [id number] POLYP-TCS 3 YEARS   COLONOSCOPY N/A 08/02/2013   hyperplastic polyps, surveillance in 2020 if benefits outweight the risks   COLONOSCOPY  06/2009   1.2 cm sessile cecal polyp which had adenocarcinoma arising in a tubular adenoma.   PARTIAL THYMECTOMY     partial thyroidectomy     benign tumors    Family History  Problem Relation Age of Onset   Colon cancer Mother        >age17   Arthritis Mother    Cancer Mother    Heart disease Mother    Hyperlipidemia Mother    Hypertension Mother    Heart attack Father    Heart disease Father    Diabetes Maternal Aunt    Hyperlipidemia Daughter    Hypertension Daughter    Liver disease Neg Hx     Social History   Socioeconomic History   Marital status: Widowed    Spouse name: Not on file   Number of children: 2   Years of education: Not on file   Highest education level: Not on file  Occupational History   Occupation: Psychologist, 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   Occupation: retired from [employer]  Tobacco Use   Smoking status: Never  Smokeless tobacco: Never  Vaping Use   Vaping Use: Never used  Substance and Sexual Activity   Alcohol use: No    Alcohol/week: 0.0 standard drinks   Drug use: No   Sexual activity: Not Currently  Other Topics Concern   Not on file  Social History Narrative   Not on file   Social Determinants of Health   Financial Resource Strain: Not on file  Food Insecurity: Not on file  Transportation Needs: Not on file  Physical Activity: Not on file  Stress: Not on file  Social Connections: Not on file  Intimate Partner Violence: Not on file    Outpatient Medications Prior to Visit  Medication Sig Dispense Refill   acetaminophen (TYLENOL) 500 MG tablet Take 1,000 mg by mouth every 8 (eight) hours as needed for mild pain or headache.     amLODipine (NORVASC) 5 MG tablet Take 1 tablet  (5 mg total) by mouth daily. 90 tablet 3   ASPERCREME LIDOCAINE EX Apply 1 application topically daily as needed (for knee pain).      aspirin 325 MG EC tablet Take 325 mg by mouth daily.     atorvastatin (LIPITOR) 80 MG tablet Take 1 tablet (80 mg total) by mouth daily. 90 tablet 3   cholecalciferol (VITAMIN D3) 25 MCG (1000 UT) tablet Take 1,000 Units by mouth daily.      gabapentin (NEURONTIN) 300 MG capsule Take 300 mg by mouth 3 (three) times daily.     levETIRAcetam (KEPPRA) 500 MG tablet Take 500 mg by mouth 2 (two) times daily.     LINZESS 72 MCG capsule TAKE (1) CAPSULE BY MOUTH DAILY AS NEEDED FOR CONSTIPATION. 90 capsule 3   loratadine (CLARITIN) 10 MG tablet Take 10 mg by mouth daily.      metoprolol tartrate (LOPRESSOR) 25 MG tablet Take 37.5 mg (1 1/2 tablets) am and 50 mg (2 tablets) pm, May take an additional 25 mg daily for palpitations 360 tablet 3   RESTASIS 0.05 % ophthalmic emulsion Place 1 drop into both eyes 2 (two) times daily as needed (chronic dry eye).     topiramate (TOPAMAX) 25 MG tablet Take 50 mg by mouth 2 (two) times daily.     traMADol (ULTRAM) 50 MG tablet Take 25 mg by mouth every 8 (eight) hours as needed for moderate pain.     meclizine (ANTIVERT) 25 MG tablet Take 1 tablet (25 mg total) by mouth 3 (three) times daily as needed for dizziness. (Patient not taking: Reported on 11/09/2020) 15 tablet 0   No facility-administered medications prior to visit.    Allergies  Allergen Reactions   Tape Rash    Zio monitor adhesive causes ulcerated and infected skin .Had to see derm    Review of Systems  Constitutional:  Negative for chills and fever.  HENT:  Negative for congestion, sinus pressure, sinus pain and sore throat.   Eyes:  Negative for pain and discharge.  Respiratory:  Negative for cough and shortness of breath.   Cardiovascular:  Positive for leg swelling. Negative for chest pain and palpitations.  Gastrointestinal:  Negative for abdominal pain,  constipation, diarrhea, nausea and vomiting.  Endocrine: Negative for polydipsia and polyuria.  Genitourinary:  Negative for dysuria and hematuria.  Musculoskeletal:  Positive for arthralgias. Negative for neck pain and neck stiffness.  Skin:  Negative for rash.  Neurological:  Positive for dizziness. Negative for weakness.  Psychiatric/Behavioral:  Negative for agitation and behavioral problems.  Objective:    Physical Exam Vitals reviewed.  Constitutional:      General: She is not in acute distress.    Appearance: She is not diaphoretic.  HENT:     Head: Normocephalic and atraumatic.     Nose: Nose normal.     Mouth/Throat:     Mouth: Mucous membranes are moist.  Eyes:     General: No scleral icterus.    Extraocular Movements: Extraocular movements intact.  Cardiovascular:     Rate and Rhythm: Normal rate and regular rhythm.     Pulses: Normal pulses.     Heart sounds: Normal heart sounds. No murmur heard. Pulmonary:     Breath sounds: Normal breath sounds. No wheezing or rales.  Abdominal:     Palpations: Abdomen is soft.     Tenderness: There is no abdominal tenderness.  Musculoskeletal:     Cervical back: Neck supple. No tenderness.     Right lower leg: Edema (1+) present.     Left lower leg: Edema (1+) present.  Skin:    General: Skin is warm.     Findings: No rash.  Neurological:     General: No focal deficit present.     Mental Status: She is alert and oriented to person, place, and time.     Sensory: Sensory deficit (Left UE) present.     Motor: Weakness (Left UE and LE - muscle strength 4/5) present.  Psychiatric:        Mood and Affect: Mood normal.        Behavior: Behavior normal.    BP 138/74 (BP Location: Right Arm, Patient Position: Sitting, Cuff Size: Normal)   Pulse 68   Resp 18   Ht $R'5\' 4"'gw$  (1.626 m)   Wt 162 lb 1.9 oz (73.5 kg)   SpO2 95%   BMI 27.83 kg/m  Wt Readings from Last 3 Encounters:  11/09/20 162 lb 1.9 oz (73.5 kg)  11/02/20  162 lb 0.6 oz (73.5 kg)  08/15/20 162 lb (73.5 kg)    Health Maintenance Due  Topic Date Due   TETANUS/TDAP  Never done   Zoster Vaccines- Shingrix (1 of 2) Never done   HEMOGLOBIN A1C  03/22/2020   FOOT EXAM  09/19/2020   URINE MICROALBUMIN  09/19/2020   INFLUENZA VACCINE  10/12/2020    There are no preventive care reminders to display for this patient.   Lab Results  Component Value Date   TSH 0.36 (L) 09/20/2019   Lab Results  Component Value Date   WBC 5.9 11/02/2020   HGB 14.4 11/02/2020   HCT 44.1 11/02/2020   MCV 99.8 11/02/2020   PLT 280 11/02/2020   Lab Results  Component Value Date   NA 141 11/02/2020   K 3.7 11/02/2020   CHLORIDE 105 04/16/2015   CO2 26 11/02/2020   GLUCOSE 97 11/02/2020   BUN 19 11/02/2020   CREATININE 1.29 (H) 11/02/2020   BILITOT 0.7 08/03/2020   ALKPHOS 103 08/03/2020   AST 20 08/03/2020   ALT 20 08/03/2020   PROT 7.5 08/03/2020   ALBUMIN 3.9 08/03/2020   CALCIUM 9.4 11/02/2020   ANIONGAP 5 11/02/2020   EGFR 50 (L) 04/16/2015   Lab Results  Component Value Date   CHOL 130 09/20/2019   Lab Results  Component Value Date   HDL 46 (L) 09/20/2019   Lab Results  Component Value Date   LDLCALC 65 09/20/2019   Lab Results  Component Value Date   TRIG  107 09/20/2019   Lab Results  Component Value Date   CHOLHDL 2.8 09/20/2019   Lab Results  Component Value Date   HGBA1C 5.7 (H) 09/20/2019       Assessment & Plan:   Problem List Items Addressed This Visit        Leg swelling - Primary Check US doppler left LE to r/o DVT Usually due to venous incompetence Advised to keep legs elevated Compression socks Avoided diuretics due to h/o CKD   Relevant Orders   US Venous Img Lower Unilateral Left (Completed)   Other Visit Diagnoses     Dizziness       Encounter for examination following treatment at hospital     ER chart reviewed including blood tests Likely due to vertigo Advised to take Meclizine as needed  for dizziness for now If persistent, will refer to PT for Vestibular therapy    Genitourinary   CKD (chronic kidney disease), stage III ([place]) Followed by Nephrology Avoid nephrotoxic agent On calcium and Vitamin D supplements      Other   Personal history of pulmonary embolism   Relevant Orders   US Venous Img Lower Unilateral Left (Completed)        No orders of the defined types were placed in this encounter.    Lindell Spar, MD

## 2020-11-10 DIAGNOSIS — H04123 Dry eye syndrome of bilateral lacrimal glands: Secondary | ICD-10-CM | POA: Diagnosis not present

## 2020-11-10 LAB — HM DIABETES EYE EXAM

## 2020-11-11 ENCOUNTER — Telehealth: Payer: Self-pay | Admitting: Internal Medicine

## 2020-11-11 NOTE — Telephone Encounter (Signed)
[name] is calling states that the test on he legs where good, however she needs clarification what needs to happen next

## 2020-11-12 NOTE — Telephone Encounter (Signed)
Vickie on dpr advised of recommendation with verbal understanding

## 2020-11-13 ENCOUNTER — Other Ambulatory Visit: Payer: Self-pay

## 2020-11-13 ENCOUNTER — Ambulatory Visit (INDEPENDENT_AMBULATORY_CARE_PROVIDER_SITE_OTHER): Payer: Medicare Other

## 2020-11-13 DIAGNOSIS — Z0000 Encounter for general adult medical examination without abnormal findings: Secondary | ICD-10-CM

## 2020-11-13 NOTE — Patient Instructions (Signed)
Health Maintenance, Female Adopting a healthy lifestyle and getting preventive care are important in promoting health and wellness. Ask your health care provider about: The right schedule for you to have regular tests and exams. Things you can do on your own to prevent diseases and keep yourself healthy. What should I know about diet, weight, and exercise? Eat a healthy diet  Eat a diet that includes plenty of vegetables, fruits, low-fat dairy products, and lean protein. Do not eat a lot of foods that are high in solid fats, added sugars, or sodium. Maintain a healthy weight Body mass index (BMI) is used to identify weight problems. It estimates body fat based on height and weight. Your health care provider can help determine your BMI and help you achieve or maintain a healthy weight. Get regular exercise Get regular exercise. This is one of the most important things you can do for your health. Most adults should: Exercise for at least 150 minutes each week. The exercise should increase your heart rate and make you sweat (moderate-intensity exercise). Do strengthening exercises at least twice a week. This is in addition to the moderate-intensity exercise. Spend less time sitting. Even light physical activity can be beneficial. Watch cholesterol and blood lipids Have your blood tested for lipids and cholesterol at 84 years of age, then have this test every 5 years. Have your cholesterol levels checked more often if: Your lipid or cholesterol levels are high. You are older than 84 years of age. You are at high risk for heart disease. What should I know about cancer screening? Depending on your health history and family history, you may need to have cancer screening at various ages. This may include screening for: Breast cancer. Cervical cancer. Colorectal cancer. Skin cancer. Lung cancer. What should I know about heart disease, diabetes, and high blood pressure? Blood pressure and heart  disease High blood pressure causes heart disease and increases the risk of stroke. This is more likely to develop in people who have high blood pressure readings, are of African descent, or are overweight. Have your blood pressure checked: Every 3-5 years if you are 18-39 years of age. Every year if you are 40 years old or older. Diabetes Have regular diabetes screenings. This checks your fasting blood sugar level. Have the screening done: Once every three years after age 40 if you are at a normal weight and have a low risk for diabetes. More often and at a younger age if you are overweight or have a high risk for diabetes. What should I know about preventing infection? Hepatitis B If you have a higher risk for hepatitis B, you should be screened for this virus. Talk with your health care provider to find out if you are at risk for hepatitis B infection. Hepatitis C Testing is recommended for: Everyone born from 1945 through 1965. Anyone with known risk factors for hepatitis C. Sexually transmitted infections (STIs) Get screened for STIs, including gonorrhea and chlamydia, if: You are sexually active and are younger than 84 years of age. You are older than 84 years of age and your health care provider tells you that you are at risk for this type of infection. Your sexual activity has changed since you were last screened, and you are at increased risk for chlamydia or gonorrhea. Ask your health care provider if you are at risk. Ask your health care provider about whether you are at high risk for HIV. Your health care provider may recommend a prescription medicine   to help prevent HIV infection. If you choose to take medicine to prevent HIV, you should first get tested for HIV. You should then be tested every 3 months for as long as you are taking the medicine. Pregnancy If you are about to stop having your period (premenopausal) and you may become pregnant, seek counseling before you get  pregnant. Take 400 to 800 micrograms (mcg) of folic acid every day if you become pregnant. Ask for birth control (contraception) if you want to prevent pregnancy. Osteoporosis and menopause Osteoporosis is a disease in which the bones lose minerals and strength with aging. This can result in bone fractures. If you are 65 years old or older, or if you are at risk for osteoporosis and fractures, ask your health care provider if you should: Be screened for bone loss. Take a calcium or vitamin D supplement to lower your risk of fractures. Be given hormone replacement therapy (HRT) to treat symptoms of menopause. Follow these instructions at home: Lifestyle Do not use any products that contain nicotine or tobacco, such as cigarettes, e-cigarettes, and chewing tobacco. If you need help quitting, ask your health care provider. Do not use street drugs. Do not share needles. Ask your health care provider for help if you need support or information about quitting drugs. Alcohol use Do not drink alcohol if: Your health care provider tells you not to drink. You are pregnant, may be pregnant, or are planning to become pregnant. If you drink alcohol: Limit how much you use to 0-1 drink a day. Limit intake if you are breastfeeding. Be aware of how much alcohol is in your drink. In the [place] one drink equals one 12 oz bottle of beer (355 mL), one 5 oz glass of wine (148 mL), or one 1 oz glass of hard liquor (44 mL). General instructions Schedule regular health, dental, and eye exams. Stay current with your vaccines. Tell your health care provider if: You often feel depressed. You have ever been abused or do not feel safe at home. Summary Adopting a healthy lifestyle and getting preventive care are important in promoting health and wellness. Follow your health care provider's instructions about healthy diet, exercising, and getting tested or screened for diseases. Follow your health care provider's  instructions on monitoring your cholesterol and blood pressure. This information is not intended to replace advice given to you by your health care provider. Make sure you discuss any questions you have with your health care provider. Document Revised: 05/08/2020 Document Reviewed: 02/21/2018 Elsevier Patient Education  2022 Elsevier Inc.  

## 2020-11-13 NOTE — Progress Notes (Signed)
Subjective:   [name] is a 86 y.o. female who presents for Medicare Annual (Subsequent) preventive examination. I connected with  [name] on 11/13/20 by a audio enabled telemedicine application and verified that I am speaking with the correct person using two identifiers.   I discussed the limitations of evaluation and management by telemedicine. The patient expressed understanding and agreed to proceed.  Location of patient: Home  Location of Provider: Office  Persons participating in virtual visit: [name] (patient) and [name]  Review of Systems    Defer to PCP       Objective:    Today's Vitals   11/13/20 1421  PainSc: 0-No pain   There is no height or weight on file to calculate BMI.  Advanced Directives 11/13/2020 11/02/2020 08/11/2020 05/28/2020 02/04/2020 12/05/2019 10/30/2019  Does Patient Have a Medical Advance Directive? Yes No;Yes No No Yes Yes Yes  Type of Paramedic of [place];Living will - - - [place];Living will [place];Living will [place];Living will  Does patient want to make changes to medical advance directive? - No - Patient declined - - No - Patient declined No - Patient declined No - Patient declined  Copy of Healthcare Power of Attorney in Chart? - - - - No - copy requested No - copy requested No - copy requested  Would patient like information on creating a medical advance directive? - - No - Patient declined - - - -  Pre-existing out of facility DNR order (yellow form or pink MOST form) - - - - - - -    Current Medications (verified) Outpatient Encounter Medications as of 11/13/2020  Medication Sig   acetaminophen (TYLENOL) 500 MG tablet Take 1,000 mg by mouth every 8 (eight) hours as needed for mild pain or headache.   amLODipine (NORVASC) 5 MG tablet Take 1 tablet (5 mg total) by mouth daily.   ASPERCREME LIDOCAINE EX Apply 1  application topically daily as needed (for knee pain).    aspirin 325 MG EC tablet Take 325 mg by mouth daily.   atorvastatin (LIPITOR) 80 MG tablet Take 1 tablet (80 mg total) by mouth daily.   cholecalciferol (VITAMIN D3) 25 MCG (1000 UT) tablet Take 1,000 Units by mouth daily.    gabapentin (NEURONTIN) 300 MG capsule Take 300 mg by mouth 3 (three) times daily.   levETIRAcetam (KEPPRA) 500 MG tablet Take 500 mg by mouth 2 (two) times daily.   LINZESS 72 MCG capsule TAKE (1) CAPSULE BY MOUTH DAILY AS NEEDED FOR CONSTIPATION.   loratadine (CLARITIN) 10 MG tablet Take 10 mg by mouth daily.    metoprolol tartrate (LOPRESSOR) 25 MG tablet Take 37.5 mg (1 1/2 tablets) am and 50 mg (2 tablets) pm, May take an additional 25 mg daily for palpitations   RESTASIS 0.05 % ophthalmic emulsion Place 1 drop into both eyes 2 (two) times daily as needed (chronic dry eye).   topiramate (TOPAMAX) 25 MG tablet Take 50 mg by mouth 2 (two) times daily.   traMADol (ULTRAM) 50 MG tablet Take 25 mg by mouth every 8 (eight) hours as needed for moderate pain.   meclizine (ANTIVERT) 25 MG tablet Take 1 tablet (25 mg total) by mouth 3 (three) times daily as needed for dizziness. (Patient not taking: No sig reported)   No facility-administered encounter medications on file as of 11/13/2020.    Allergies (verified) Tape   History: Past Medical  History:  Diagnosis Date   Allergy    Arthritis    Colon cancer ([place])    colon ca dx 07/30/09   History of cardiac monitoring 07/2017   "Event monitor demonstrated sinus rhythm with isolated PACs and no arrhythmias"   History of colon cancer 06/2009   found at time of TCS 06/29/09, 1.2cm sessile cecal polyp, no adjuvent therapy needed   HTN (hypertension)    Hx of cardiovascular stress test 07/2017   "No diagnostic ST segment changes to indicate ischemia. Small, moderate intensity, reversible apical to basal inferolateral defect consistent with ischemia. This is a low risk  study. Nuclear stress EF: 84%."   Hyperlipidemia    Hypothyroidism    PE (pulmonary thromboembolism) ([place])    Renal disorder    cyst on kidney    Silent micro-hemorrhage of brain ([place]) 11/25/2018   Stroke [facility])    TIA (transient ischemic attack) 11/25/2018   Vertigo    Past Surgical History:  Procedure Laterality Date   ABDOMINAL HYSTERECTOMY     COLON SURGERY  07/2009   right hemicolectomy, no residual colon cancer on path   COLONOSCOPY  07/16/2010   [id number] POLYP-TCS 3 YEARS   COLONOSCOPY N/A 08/02/2013   hyperplastic polyps, surveillance in 2020 if benefits outweight the risks   COLONOSCOPY  06/2009   1.2 cm sessile cecal polyp which had adenocarcinoma arising in a tubular adenoma.   PARTIAL THYMECTOMY     partial thyroidectomy     benign tumors   Family History  Problem Relation Age of Onset   Colon cancer Mother        >age33   Arthritis Mother    Cancer Mother    Heart disease Mother    Hyperlipidemia Mother    Hypertension Mother    Heart attack Father    Heart disease Father    Diabetes Maternal Aunt    Hyperlipidemia Daughter    Hypertension Daughter    Liver disease Neg Hx    Social History   Socioeconomic History   Marital status: Widowed    Spouse name: Not on file   Number of children: 2   Years of education: Not on file   Highest education level: Not on file  Occupational History   Occupation: Psychologist, 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   Occupation: retired from [employer]  Tobacco Use   Smoking status: Never   Smokeless tobacco: Never  Vaping Use   Vaping Use: Never used  Substance and Sexual Activity   Alcohol use: No    Alcohol/week: 0.0 standard drinks   Drug use: No   Sexual activity: Not Currently  Other Topics Concern   Not on file  Social History Narrative   Not on file   Social Determinants of Health   Financial Resource Strain: Low Risk    Difficulty of Paying Living Expenses: Not very hard  Food Insecurity: No Food  Insecurity   Worried About Char[employer] fundraiser in the Last Year: Never true   [place] in the Last Year: Never true  Transportation Needs: No Transportation Needs   Lack of Transportation (Medical): No   Lack of Transportation (Non-Medical): No  Physical Activity: Inactive   Days of Exercise per Week: 0 days   Minutes of Exercise per Session: 0 min  Stress: No Stress Concern Present   Feeling of Stress : Only a little  Social Connections: Moderately Isolated   Frequency of Communication with Friends and  Family: More than three times a week   Frequency of Social Gatherings with Friends and Family: More than three times a week   Attends Religious Services: More than 4 times per year   Active Member of Clubs or Organizations: No   Attends Archivist Meetings: Never   Marital Status: Widowed    Tobacco Counseling Counseling given: Not Answered   Clinical Intake:  Pre-visit preparation completed: Yes  Pain : No/denies pain Pain Score: 0-No pain     Nutritional Risks: None  How often do you need to have someone help you when you read instructions, pamphlets, or other written materials from your doctor or pharmacy?: 4 - Often (daughter assist with reading instructions)  Diabetic?NO  Interpreter Needed?: No  Information entered by :: Tita Terhaar J,CMA   Activities of Daily Living In your present state of health, do you have any difficulty performing the following activities: 11/13/2020 11/09/2020  Hearing? N N  Vision? N N  Difficulty concentrating or making decisions? N N  Walking or climbing stairs? Y Y  Dressing or bathing? Y Y  Doing errands, shopping? Tempie Donning  Preparing Food and eating ? N -  Using the Toilet? N -  In the past six months, have you accidently leaked urine? N -  Do you have problems with loss of bowel control? N -  Managing your Medications? Y -  Managing your Finances? Y -  Housekeeping or managing your Housekeeping? N -  Some recent data  might be hidden    Patient Care Team: Lindell Spar, MD as PCP - General (Internal Medicine) Herminio Commons, MD (Inactive) as PCP - Cardiology (Cardiology) Danie Binder, MD (Inactive) (Gastroenterology)  Indicate any recent Medical Services you may have received from other than [facility] providers in the past year (date may be approximate).     Assessment:   This is a routine wellness examination for [name].  Hearing/Vision screen No results found.  Dietary issues and exercise activities discussed: Current Exercise Habits: The patient does not participate in regular exercise at present   Goals Addressed   None    Depression Screen PHQ 2/9 Scores 11/13/2020 11/09/2020 08/12/2020 09/20/2019 08/14/2018 05/11/2018 02/28/2018  PHQ - 2 Score 0 0 0 0 0 0 0  PHQ- 9 Score - - - - - - -    Fall Risk Fall Risk  11/13/2020 11/09/2020 08/12/2020 09/20/2019 05/11/2018  Falls in the past year? 0 0 0 0 0  Number falls in past yr: 0 0 0 - -  Injury with Fall? 0 0 0 - -  Risk for fall due to : Impaired mobility No Fall Risks No Fall Risks No Fall Risks History of fall(s);Impaired balance/gait;Impaired mobility  Follow up Falls evaluation completed Falls evaluation completed Falls evaluation completed Falls evaluation completed Falls evaluation completed    FALL RISK PREVENTION PERTAINING TO THE HOME:  Any stairs in or around the home? No  If so, are there any without handrails? No  Home free of loose throw rugs in walkways, pet beds, electrical cords, etc? Yes  Adequate lighting in your home to reduce risk of falls? Yes   ASSISTIVE DEVICES UTILIZED TO PREVENT FALLS:  Life alert? Yes  Use of a cane, walker or w/c? Yes  Grab bars in the bathroom? Yes  Shower chair or bench in shower? Yes  Elevated toilet seat or a handicapped toilet? Yes   TIMED UP AND GO:  Was the test performed?  N/A .  Length of time to ambulate 10 feet: N/A sec.     Cognitive Function:     6CIT Screen 11/13/2020   What Year? 0 points  What month? 0 points  What time? 0 points  Count back from 20 0 points  Months in reverse 2 points  Repeat phrase 4 points  Total Score 6    Immunizations Immunization History  Administered Date(s) Administered   Fluad Quad(high Dose 65+) 11/16/2018, 12/18/2019   Influenza, High Dose Seasonal PF 12/14/2017   Influenza,inj,Quad PF,6+ Mos 12/15/2015   Moderna SARS-COV2 Booster Vaccination 09/15/2020   Moderna Sars-Covid-2 Vaccination 04/20/2019, 05/21/2019   PFIZER(Purple Top)SARS-COV-2 Vaccination 02/10/2020   Zoster, Live 06/18/2014    TDAP status: Due, Education has been provided regarding the importance of this vaccine. Advised may receive this vaccine at local pharmacy or Health Dept. Aware to provide a copy of the vaccination record if obtained from local pharmacy or Health Dept. Verbalized acceptance and understanding.  Flu Vaccine status: Due, Education has been provided regarding the importance of this vaccine. Advised may receive this vaccine at local pharmacy or Health Dept. Aware to provide a copy of the vaccination record if obtained from local pharmacy or Health Dept. Verbalized acceptance and understanding.  Pneumococcal vaccine status: Up to date  Covid-19 vaccine status: Completed vaccines  Qualifies for Shingles Vaccine? Yes   Zostavax completed  N/A   Shingrix Completed?: No.    Education has been provided regarding the importance of this vaccine. Patient has been advised to call insurance company to determine out of pocket expense if they have not yet received this vaccine. Advised may also receive vaccine at local pharmacy or Health Dept. Verbalized acceptance and understanding.  Screening Tests Health Maintenance  Topic Date Due   TETANUS/TDAP  Never done   Zoster Vaccines- Shingrix (1 of 2) Never done   HEMOGLOBIN A1C  03/22/2020   OPHTHALMOLOGY EXAM  05/06/2020   FOOT EXAM  09/19/2020   URINE MICROALBUMIN  09/19/2020   INFLUENZA  VACCINE  10/12/2020   COVID-19 Vaccine (5 - Booster) 01/16/2021   DEXA SCAN  Completed   HPV VACCINES  Aged Out   PNA vac Low Risk Adult  Discontinued    Health Maintenance  Health Maintenance Due  Topic Date Due   TETANUS/TDAP  Never done   Zoster Vaccines- Shingrix (1 of 2) Never done   HEMOGLOBIN A1C  03/22/2020   OPHTHALMOLOGY EXAM  05/06/2020   FOOT EXAM  09/19/2020   URINE MICROALBUMIN  09/19/2020   INFLUENZA VACCINE  10/12/2020    Colorectal cancer screening: No longer required.   Mammogram status: No longer required due to AGE.  Bone Density status: Completed 01/27/2012. Results reflect: Bone density results: OSTEOPENIA. Repeat every 2 years.  Lung Cancer Screening: (Low Dose CT Chest recommended if Age 16-80 years, 30 pack-year currently smoking OR have quit w/in 15years.) does not qualify.   Lung Cancer Screening Referral: NO  Additional Screening:  Hepatitis C Screening: does qualify; Completed  Vision Screening: Recommended annual ophthalmology exams for early detection of glaucoma and other disorders of the eye. Is the patient up to date with their annual eye exam?  Yes  Who is the provider or what is the name of the office in which the patient attends annual eye exams? [facility]  If pt is not established with a provider, would they like to be referred to a provider to establish care? No .   Dental Screening: Recommended annual dental exams  for proper oral hygiene  Community Resource Referral / Chronic Care Management: CRR required this visit?  No   CCM required this visit?  No      Plan:     I have personally reviewed and noted the following in the patient's chart:   Medical and social history Use of alcohol, tobacco or illicit drugs  Current medications and supplements including opioid prescriptions.  Functional ability and status Nutritional status Physical activity Advanced directives List of other physicians Hospitalizations,  surgeries, and ER visits in previous 12 months Vitals Screenings to include cognitive, depression, and falls Referrals and appointments  In addition, I have reviewed and discussed with patient certain preventive protocols, quality metrics, and best practice recommendations. A written personalized care plan for preventive services as well as general preventive health recommendations were provided to patient.     Edgar Frisk, [facility]   11/13/2020   Nurse Notes: Non Face to Face 30 minute visit Encounter.   Ms. Kurlander , Thank you for taking time to come for your Medicare Wellness Visit. I appreciate your ongoing commitment to your health goals. Please review the following plan we discussed and let me know if I can assist you in the future.   These are the goals we discussed:  Goals   None     This is a list of the screening recommended for you and due dates:  Health Maintenance  Topic Date Due   Tetanus Vaccine  Never done   Zoster (Shingles) Vaccine (1 of 2) Never done   Hemoglobin A1C  03/22/2020   Eye exam for diabetics  05/06/2020   Complete foot exam   09/19/2020   Urine Protein Check  09/19/2020   Flu Shot  10/12/2020   COVID-19 Vaccine (5 - Booster) 01/16/2021   DEXA scan (bone density measurement)  Completed   HPV Vaccine  Aged Out   Pneumonia vaccines  Discontinued   Patient advised AVS can be reviewed on MyChart

## 2020-12-01 DIAGNOSIS — I10 Essential (primary) hypertension: Secondary | ICD-10-CM | POA: Diagnosis not present

## 2020-12-01 DIAGNOSIS — I6930 Unspecified sequelae of cerebral infarction: Secondary | ICD-10-CM | POA: Diagnosis not present

## 2020-12-01 DIAGNOSIS — G890 Central pain syndrome: Secondary | ICD-10-CM | POA: Diagnosis not present

## 2020-12-01 DIAGNOSIS — I679 Cerebrovascular disease, unspecified: Secondary | ICD-10-CM | POA: Diagnosis not present

## 2020-12-01 DIAGNOSIS — M25571 Pain in right ankle and joints of right foot: Secondary | ICD-10-CM | POA: Diagnosis not present

## 2020-12-01 DIAGNOSIS — G4489 Other headache syndrome: Secondary | ICD-10-CM | POA: Diagnosis not present

## 2020-12-01 DIAGNOSIS — Z79891 Long term (current) use of opiate analgesic: Secondary | ICD-10-CM | POA: Diagnosis not present

## 2020-12-08 DIAGNOSIS — E1122 Type 2 diabetes mellitus with diabetic chronic kidney disease: Secondary | ICD-10-CM | POA: Diagnosis not present

## 2020-12-08 DIAGNOSIS — I129 Hypertensive chronic kidney disease with stage 1 through stage 4 chronic kidney disease, or unspecified chronic kidney disease: Secondary | ICD-10-CM | POA: Diagnosis not present

## 2020-12-08 DIAGNOSIS — I10 Essential (primary) hypertension: Secondary | ICD-10-CM | POA: Diagnosis not present

## 2020-12-08 DIAGNOSIS — N189 Chronic kidney disease, unspecified: Secondary | ICD-10-CM | POA: Diagnosis not present

## 2020-12-08 DIAGNOSIS — I5032 Chronic diastolic (congestive) heart failure: Secondary | ICD-10-CM | POA: Diagnosis not present

## 2020-12-08 DIAGNOSIS — E119 Type 2 diabetes mellitus without complications: Secondary | ICD-10-CM | POA: Diagnosis not present

## 2020-12-09 LAB — CMP14+EGFR
ALT: 37 IU/L — ABNORMAL HIGH (ref 0–32)
AST: 26 IU/L (ref 0–40)
Albumin/Globulin Ratio: 1.4 (ref 1.2–2.2)
Albumin: 4 g/dL (ref 3.6–4.6)
Alkaline Phosphatase: 107 IU/L (ref 44–121)
BUN/Creatinine Ratio: 18 (ref 12–28)
BUN: 26 mg/dL (ref 8–27)
Bilirubin Total: 0.6 mg/dL (ref 0.0–1.2)
CO2: 22 mmol/L (ref 20–29)
Calcium: 9.2 mg/dL (ref 8.7–10.3)
Chloride: 105 mmol/L (ref 96–106)
Creatinine, Ser: 1.44 mg/dL — ABNORMAL HIGH (ref 0.57–1.00)
Globulin, Total: 2.9 g/dL (ref 1.5–4.5)
Glucose: 88 mg/dL (ref 70–99)
Potassium: 3.8 mmol/L (ref 3.5–5.2)
Sodium: 143 mmol/L (ref 134–144)
Total Protein: 6.9 g/dL (ref 6.0–8.5)
eGFR: 36 mL/min/{1.73_m2} — ABNORMAL LOW (ref >59)

## 2020-12-09 LAB — TSH: TSH: 0.456 u[IU]/mL (ref 0.450–4.500)

## 2020-12-09 LAB — LIPID PANEL
Chol/HDL Ratio: 2.5 ratio (ref 0.0–4.4)
Cholesterol, Total: 150 mg/dL (ref 100–199)
HDL: 59 mg/dL (ref >39)
LDL Chol Calc (NIH): 76 mg/dL (ref 0–99)
Triglycerides: 80 mg/dL (ref 0–149)
VLDL Cholesterol Cal: 15 mg/dL (ref 5–40)

## 2020-12-09 LAB — CBC
Hematocrit: 44.6 % (ref 34.0–46.6)
Hemoglobin: 14.8 g/dL (ref 11.1–15.9)
MCH: 30.9 pg (ref 26.6–33.0)
MCHC: 33.2 g/dL (ref 31.5–35.7)
MCV: 93 fL (ref 79–97)
Platelets: 208 10*3/uL (ref 150–450)
RBC: 4.79 x10E6/uL (ref 3.77–5.28)
RDW: 13.7 % (ref 11.7–15.4)
WBC: 10.7 10*3/uL (ref 3.4–10.8)

## 2020-12-09 LAB — HEMOGLOBIN A1C
Est. average glucose Bld gHb Est-mCnc: 131 mg/dL
Hgb A1c MFr Bld: 6.2 % — ABNORMAL HIGH (ref 4.8–5.6)

## 2020-12-15 ENCOUNTER — Encounter: Payer: Self-pay | Admitting: Internal Medicine

## 2020-12-15 ENCOUNTER — Ambulatory Visit (INDEPENDENT_AMBULATORY_CARE_PROVIDER_SITE_OTHER): Payer: Medicare Other | Admitting: Internal Medicine

## 2020-12-15 ENCOUNTER — Other Ambulatory Visit: Payer: Self-pay

## 2020-12-15 VITALS — BP 160/81 | HR 71 | Temp 98.8°F | Resp 18 | Ht 63.0 in | Wt 161.1 lb

## 2020-12-15 DIAGNOSIS — I10 Essential (primary) hypertension: Secondary | ICD-10-CM

## 2020-12-15 DIAGNOSIS — Z0001 Encounter for general adult medical examination with abnormal findings: Secondary | ICD-10-CM

## 2020-12-15 DIAGNOSIS — M7989 Other specified soft tissue disorders: Secondary | ICD-10-CM

## 2020-12-15 DIAGNOSIS — N1832 Chronic kidney disease, stage 3b: Secondary | ICD-10-CM

## 2020-12-15 DIAGNOSIS — Z23 Encounter for immunization: Secondary | ICD-10-CM | POA: Diagnosis not present

## 2020-12-15 NOTE — Progress Notes (Signed)
Established Patient Office Visit  Subjective:  Patient ID: [name], female    DOB: May 24, 1934  Age: 86 y.o. MRN: [id number]  CC:  Chief Complaint  Patient presents with   Annual Exam    Annual exam pt would like ankles looked at again from past visit also bp has been running kind of high     HPI [name] is a 86 year old female with PMH of HTN, prediabetes, CVA with residual left sides numbness and weakness, ?partial seizure, hypothyroidism, colon ca. s/p right hemicolectomy, chronic constipation, PE and CKD stage 3 who presents for annual physical.  HTN: Her BP was elevated in the office today upon multiple measurements. Her has been elevated lately at home.  She has been taking metoprolol and amlodipine regularly.  She denies any headache, dizziness, chest pain or dyspnea.  Her leg swelling has improved with leg elevation and compression stockings.  She still has mild ankle swelling bilaterally.  Blood tests were reviewed and discussed with the patient in detail.  Past Medical History:  Diagnosis Date   Allergy    Arthritis    Colon cancer ([place])    colon ca dx 07/30/09   History of cardiac monitoring 07/2017   "Event monitor demonstrated sinus rhythm with isolated PACs and no arrhythmias"   History of colon cancer 06/2009   found at time of TCS 06/29/09, 1.2cm sessile cecal polyp, no adjuvent therapy needed   HTN (hypertension)    Hx of cardiovascular stress test 07/2017   "No diagnostic ST segment changes to indicate ischemia. Small, moderate intensity, reversible apical to basal inferolateral defect consistent with ischemia. This is a low risk study. Nuclear stress EF: 84%."   Hyperlipidemia    Hypothyroidism    PE (pulmonary thromboembolism) ([place])    Renal disorder    cyst on kidney    Silent micro-hemorrhage of brain ([place]) 11/25/2018   Stroke [facility])    TIA (transient ischemic attack) 11/25/2018   Vertigo     Past Surgical History:   Procedure Laterality Date   ABDOMINAL HYSTERECTOMY     COLON SURGERY  07/2009   right hemicolectomy, no residual colon cancer on path   COLONOSCOPY  07/16/2010   RPR:[id number] POLYP-TCS 3 YEARS   COLONOSCOPY N/A 08/02/2013   hyperplastic polyps, surveillance in 2020 if benefits outweight the risks   COLONOSCOPY  06/2009   1.2 cm sessile cecal polyp which had adenocarcinoma arising in a tubular adenoma.   PARTIAL THYMECTOMY     partial thyroidectomy     benign tumors    Family History  Problem Relation Age of Onset   Colon cancer Mother        >age33   Arthritis Mother    Cancer Mother    Heart disease Mother    Hyperlipidemia Mother    Hypertension Mother    Heart attack Father    Heart disease Father    Diabetes Maternal Aunt    Hyperlipidemia Daughter    Hypertension Daughter    Liver disease Neg Hx     Social History   Socioeconomic History   Marital status: Widowed    Spouse name: Not on file   Number of children: 2   Years of education: Not on file   Highest education level: Not on file  Occupational History   Occupation: Psychologist, 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   Occupation: retired from [employer]  Tobacco Use   Smoking status: Never   Smokeless tobacco:  Never  Vaping Use   Vaping Use: Never used  Substance and Sexual Activity   Alcohol use: No    Alcohol/week: 0.0 standard drinks   Drug use: No   Sexual activity: Not Currently  Other Topics Concern   Not on file  Social History Narrative   Not on file   Social Determinants of Health   Financial Resource Strain: Low Risk    Difficulty of Paying Living Expenses: Not very hard  Food Insecurity: No Food Insecurity   Worried About Running Out of Food in the Last Year: Never true   Ran Out of Food in the Last Year: Never true  Transportation Needs: No Transportation Needs   Lack of Transportation (Medical): No   Lack of Transportation (Non-Medical): No  Physical Activity: Inactive   Days of  Exercise per Week: 0 days   Minutes of Exercise per Session: 0 min  Stress: No Stress Concern Present   Feeling of Stress : Only a little  Social Connections: Moderately Isolated   Frequency of Communication with Friends and Family: More than three times a week   Frequency of Social Gatherings with Friends and Family: More than three times a week   Attends Religious Services: More than 4 times per year   Active Member of [employer] or Organizations: No   Attends Archivist Meetings: Never   Marital Status: Widowed  Human resources officer Violence: Not At Risk   Fear of Current or Ex-Partner: No   Emotionally Abused: No   Physically Abused: No   Sexually Abused: No    Outpatient Medications Prior to Visit  Medication Sig Dispense Refill   acetaminophen (TYLENOL) 500 MG tablet Take 1,000 mg by mouth every 8 (eight) hours as needed for mild pain or headache.     amLODipine (NORVASC) 5 MG tablet Take 1 tablet (5 mg total) by mouth daily. 90 tablet 3   ASPERCREME LIDOCAINE EX Apply 1 application topically daily as needed (for knee pain).      aspirin 325 MG EC tablet Take 325 mg by mouth daily.     atorvastatin (LIPITOR) 80 MG tablet Take 1 tablet (80 mg total) by mouth daily. 90 tablet 3   cholecalciferol (VITAMIN D3) 25 MCG (1000 UT) tablet Take 1,000 Units by mouth daily.      gabapentin (NEURONTIN) 300 MG capsule Take 300 mg by mouth 3 (three) times daily.     levETIRAcetam (KEPPRA) 500 MG tablet Take 500 mg by mouth 2 (two) times daily.     LINZESS 72 MCG capsule TAKE (1) CAPSULE BY MOUTH DAILY AS NEEDED FOR CONSTIPATION. 90 capsule 3   loratadine (CLARITIN) 10 MG tablet Take 10 mg by mouth daily.      meclizine (ANTIVERT) 25 MG tablet Take 1 tablet (25 mg total) by mouth 3 (three) times daily as needed for dizziness. 15 tablet 0   metoprolol tartrate (LOPRESSOR) 25 MG tablet Take 37.5 mg (1 1/2 tablets) am and 50 mg (2 tablets) pm, May take an additional 25 mg daily for palpitations  360 tablet 3   RESTASIS 0.05 % ophthalmic emulsion Place 1 drop into both eyes 2 (two) times daily as needed (chronic dry eye).     topiramate (TOPAMAX) 25 MG tablet Take 50 mg by mouth 2 (two) times daily.     traMADol (ULTRAM) 50 MG tablet Take 25 mg by mouth every 8 (eight) hours as needed for moderate pain.     No facility-administered medications prior to  visit.    Allergies  Allergen Reactions   Tape Rash    Zio monitor adhesive causes ulcerated and infected skin .Had to see derm    ROS Review of Systems  Constitutional:  Negative for chills and fever.  HENT:  Negative for congestion, sinus pressure, sinus pain and sore throat.   Eyes:  Negative for pain and discharge.  Respiratory:  Negative for cough and shortness of breath.   Cardiovascular:  Positive for leg swelling. Negative for chest pain and palpitations.  Gastrointestinal:  Negative for abdominal pain, constipation, diarrhea, nausea and vomiting.  Endocrine: Negative for polydipsia and polyuria.  Genitourinary:  Negative for dysuria and hematuria.  Musculoskeletal:  Positive for arthralgias. Negative for neck pain and neck stiffness.  Skin:  Negative for rash.  Neurological:  Negative for dizziness and weakness.  Psychiatric/Behavioral:  Negative for agitation and behavioral problems.      Objective:    Physical Exam Vitals reviewed.  Constitutional:      General: She is not in acute distress.    Appearance: She is not diaphoretic.  HENT:     Head: Normocephalic and atraumatic.     Nose: Nose normal.     Mouth/Throat:     Mouth: Mucous membranes are moist.  Eyes:     General: No scleral icterus.    Extraocular Movements: Extraocular movements intact.  Cardiovascular:     Rate and Rhythm: Normal rate and regular rhythm.     Pulses: Normal pulses.     Heart sounds: Normal heart sounds. No murmur heard. Pulmonary:     Breath sounds: Normal breath sounds. No wheezing or rales.  Abdominal:     Palpations:  Abdomen is soft.     Tenderness: There is no abdominal tenderness.  Musculoskeletal:     Cervical back: Neck supple. No tenderness.     Right lower leg: Edema (1+) present.     Left lower leg: Edema (1+) present.  Skin:    General: Skin is warm.     Findings: No rash.  Neurological:     General: No focal deficit present.     Mental Status: She is alert and oriented to person, place, and time.     Sensory: Sensory deficit (Left UE) present.     Motor: Weakness (Left UE and LE - muscle strength 4/5) present.  Psychiatric:        Mood and Affect: Mood normal.        Behavior: Behavior normal.    BP (!) 160/81 (BP Location: Left Arm, Patient Position: Sitting, Cuff Size: Normal)   Pulse 71   Temp 98.8 F (37.1 C) (Oral)   Resp 18   Ht 5\' 3"  (1.6 m)   Wt 161 lb 1.9 oz (73.1 kg)   SpO2 96%   BMI 28.54 kg/m  Wt Readings from Last 3 Encounters:  12/15/20 161 lb 1.9 oz (73.1 kg)  11/09/20 162 lb 1.9 oz (73.5 kg)  11/02/20 162 lb 0.6 oz (73.5 kg)     Health Maintenance Due  Topic Date Due   TETANUS/TDAP  Never done   Zoster Vaccines- Shingrix (1 of 2) Never done   OPHTHALMOLOGY EXAM  05/06/2020   URINE MICROALBUMIN  09/19/2020    There are no preventive care reminders to display for this patient.  Lab Results  Component Value Date   TSH 0.456 12/08/2020   Lab Results  Component Value Date   WBC 10.7 12/08/2020   HGB 14.8 12/08/2020  HCT 44.6 12/08/2020   MCV 93 12/08/2020   PLT 208 12/08/2020   Lab Results  Component Value Date   NA 143 12/08/2020   K 3.8 12/08/2020   CHLORIDE 105 04/16/2015   CO2 22 12/08/2020   GLUCOSE 88 12/08/2020   BUN 26 12/08/2020   CREATININE 1.44 (H) 12/08/2020   BILITOT 0.6 12/08/2020   ALKPHOS 107 12/08/2020   AST 26 12/08/2020   ALT 37 (H) 12/08/2020   PROT 6.9 12/08/2020   ALBUMIN 4.0 12/08/2020   CALCIUM 9.2 12/08/2020   ANIONGAP 5 11/02/2020   EGFR 36 (L) 12/08/2020   Lab Results  Component Value Date   CHOL 150  12/08/2020   Lab Results  Component Value Date   HDL 59 12/08/2020   Lab Results  Component Value Date   LDLCALC 76 12/08/2020   Lab Results  Component Value Date   TRIG 80 12/08/2020   Lab Results  Component Value Date   CHOLHDL 2.5 12/08/2020   Lab Results  Component Value Date   HGBA1C 6.2 (H) 12/08/2020      Assessment & Plan:   Problem List Items Addressed This Visit       Encounter for general adult medical examination with abnormal findings - Primary   Physical exam as documented. Counseling done  re healthy lifestyle involving commitment to 150 minutes exercise per week, heart healthy diet, and attaining healthy weight.The importance of adequate sleep also discussed. Changes in health habits are decided on by the patient with goals and time frames  set for achieving them. Immunization and cancer screening needs are specifically addressed at this visit.       Cardiovascular and Mediastinum   Essential hypertension    BP Readings from Last 1 Encounters:  12/15/20 (!) 160/81  Uncontrolled with Amlodipine and Metoprolol Increased dose of Metoprolol to 50 mg BID for now Counseled for compliance with the medications Advised DASH diet and moderate exercise/walking as tolerated        Genitourinary   CKD (chronic kidney disease), stage III ([place])    Last CMP reviewed, stable CKD stage 3b F/u with Nephrology Avoid nephrotoxic agents No proteinuria currently according to Nephrology chart        Other   Leg swelling    Now better with leg elevation and compression stockings            Other Visit Diagnoses     Need for immunization against influenza       Relevant Orders   Flu Vaccine QUAD High Dose(Fluad) (Completed)       No orders of the defined types were placed in this encounter.   Follow-up: Return in about 4 months (around 04/17/2021) for HTN and leg swelling.    Lindell Spar, MD

## 2020-12-15 NOTE — Assessment & Plan Note (Signed)
BP Readings from Last 1 Encounters:  12/15/20 (!) 160/81   Uncontrolled with Amlodipine and Metoprolol Increased dose of Metoprolol to 50 mg BID for now Counseled for compliance with the medications Advised DASH diet and moderate exercise/walking as tolerated

## 2020-12-15 NOTE — Assessment & Plan Note (Addendum)
Last CMP reviewed, stable CKD stage 3b F/u with Nephrology Avoid nephrotoxic agents No proteinuria currently according to Nephrology chart

## 2020-12-15 NOTE — Assessment & Plan Note (Signed)

## 2020-12-15 NOTE — Patient Instructions (Addendum)
Please start taking Metoprolol 50 mg twice daily. Continue taking Amlodipine as prescribed.  Please continue to maintain adequate hydration by taking at least 50 ounces of fluid in a day.  Continue to follow low salt diet and ambulate as tolerated.

## 2020-12-15 NOTE — Assessment & Plan Note (Signed)
Now better with leg elevation and compression stockings

## 2020-12-29 DIAGNOSIS — N281 Cyst of kidney, acquired: Secondary | ICD-10-CM | POA: Diagnosis not present

## 2020-12-29 DIAGNOSIS — I5032 Chronic diastolic (congestive) heart failure: Secondary | ICD-10-CM | POA: Diagnosis not present

## 2020-12-29 DIAGNOSIS — E1122 Type 2 diabetes mellitus with diabetic chronic kidney disease: Secondary | ICD-10-CM | POA: Diagnosis not present

## 2020-12-29 DIAGNOSIS — N189 Chronic kidney disease, unspecified: Secondary | ICD-10-CM | POA: Diagnosis not present

## 2020-12-29 DIAGNOSIS — I129 Hypertensive chronic kidney disease with stage 1 through stage 4 chronic kidney disease, or unspecified chronic kidney disease: Secondary | ICD-10-CM | POA: Diagnosis not present

## 2021-01-06 ENCOUNTER — Observation Stay (HOSPITAL_COMMUNITY): Payer: Medicare Other

## 2021-01-06 ENCOUNTER — Other Ambulatory Visit: Payer: Self-pay

## 2021-01-06 ENCOUNTER — Emergency Department (HOSPITAL_COMMUNITY): Payer: Medicare Other

## 2021-01-06 ENCOUNTER — Encounter (HOSPITAL_COMMUNITY): Payer: Self-pay

## 2021-01-06 ENCOUNTER — Observation Stay (HOSPITAL_COMMUNITY)
Admission: EM | Admit: 2021-01-06 | Discharge: 2021-01-07 | Disposition: A | Discharge status: Home / Self Care | Payer: Medicare Other | Attending: Family Medicine | Admitting: Family Medicine

## 2021-01-06 DIAGNOSIS — E876 Hypokalemia: Secondary | ICD-10-CM | POA: Diagnosis not present

## 2021-01-06 DIAGNOSIS — R2681 Unsteadiness on feet: Secondary | ICD-10-CM | POA: Diagnosis not present

## 2021-01-06 DIAGNOSIS — N183 Chronic kidney disease, stage 3 unspecified: Secondary | ICD-10-CM | POA: Diagnosis not present

## 2021-01-06 DIAGNOSIS — G9341 Metabolic encephalopathy: Secondary | ICD-10-CM

## 2021-01-06 DIAGNOSIS — E039 Hypothyroidism, unspecified: Secondary | ICD-10-CM

## 2021-01-06 DIAGNOSIS — R7303 Prediabetes: Secondary | ICD-10-CM | POA: Diagnosis not present

## 2021-01-06 DIAGNOSIS — R531 Weakness: Secondary | ICD-10-CM | POA: Diagnosis not present

## 2021-01-06 DIAGNOSIS — J9811 Atelectasis: Secondary | ICD-10-CM | POA: Diagnosis not present

## 2021-01-06 DIAGNOSIS — G459 Transient cerebral ischemic attack, unspecified: Principal | ICD-10-CM | POA: Insufficient documentation

## 2021-01-06 DIAGNOSIS — Z7982 Long term (current) use of aspirin: Secondary | ICD-10-CM | POA: Insufficient documentation

## 2021-01-06 DIAGNOSIS — G319 Degenerative disease of nervous system, unspecified: Secondary | ICD-10-CM | POA: Diagnosis not present

## 2021-01-06 DIAGNOSIS — I129 Hypertensive chronic kidney disease with stage 1 through stage 4 chronic kidney disease, or unspecified chronic kidney disease: Secondary | ICD-10-CM | POA: Diagnosis not present

## 2021-01-06 DIAGNOSIS — Z85038 Personal history of other malignant neoplasm of large intestine: Secondary | ICD-10-CM | POA: Insufficient documentation

## 2021-01-06 DIAGNOSIS — Z79899 Other long term (current) drug therapy: Secondary | ICD-10-CM | POA: Diagnosis not present

## 2021-01-06 DIAGNOSIS — R410 Disorientation, unspecified: Secondary | ICD-10-CM

## 2021-01-06 DIAGNOSIS — R4182 Altered mental status, unspecified: Secondary | ICD-10-CM | POA: Diagnosis present

## 2021-01-06 DIAGNOSIS — Z20822 Contact with and (suspected) exposure to covid-19: Secondary | ICD-10-CM | POA: Insufficient documentation

## 2021-01-06 DIAGNOSIS — I10 Essential (primary) hypertension: Secondary | ICD-10-CM | POA: Diagnosis not present

## 2021-01-06 DIAGNOSIS — Z743 Need for continuous supervision: Secondary | ICD-10-CM | POA: Diagnosis not present

## 2021-01-06 DIAGNOSIS — I517 Cardiomegaly: Secondary | ICD-10-CM | POA: Diagnosis not present

## 2021-01-06 DIAGNOSIS — R0902 Hypoxemia: Secondary | ICD-10-CM | POA: Diagnosis not present

## 2021-01-06 DIAGNOSIS — I6523 Occlusion and stenosis of bilateral carotid arteries: Secondary | ICD-10-CM | POA: Diagnosis not present

## 2021-01-06 LAB — PROTIME-INR
INR: 1.1 (ref 0.8–1.2)
Prothrombin Time: 14.3 seconds (ref 11.4–15.2)

## 2021-01-06 LAB — CBC WITH DIFFERENTIAL/PLATELET
Abs Immature Granulocytes: 0.05 10*3/uL (ref 0.00–0.07)
Basophils Absolute: 0.1 10*3/uL (ref 0.0–0.1)
Basophils Relative: 1 %
Eosinophils Absolute: 0.1 10*3/uL (ref 0.0–0.5)
Eosinophils Relative: 1 %
HCT: 42.7 % (ref 36.0–46.0)
Hemoglobin: 13.6 g/dL (ref 12.0–15.0)
Immature Granulocytes: 0 %
Lymphocytes Relative: 14 %
Lymphs Abs: 1.7 10*3/uL (ref 0.7–4.0)
MCH: 32.2 pg (ref 26.0–34.0)
MCHC: 31.9 g/dL (ref 30.0–36.0)
MCV: 101.2 fL — ABNORMAL HIGH (ref 80.0–100.0)
Monocytes Absolute: 0.8 10*3/uL (ref 0.1–1.0)
Monocytes Relative: 7 %
Neutro Abs: 9.1 10*3/uL — ABNORMAL HIGH (ref 1.7–7.7)
Neutrophils Relative %: 77 %
Platelets: 263 10*3/uL (ref 150–400)
RBC: 4.22 MIL/uL (ref 3.87–5.11)
RDW: 15 % (ref 11.5–15.5)
WBC: 11.8 10*3/uL — ABNORMAL HIGH (ref 4.0–10.5)
nRBC: 0 % (ref 0.0–0.2)

## 2021-01-06 LAB — COMPREHENSIVE METABOLIC PANEL
ALT: 16 U/L (ref 0–44)
AST: 18 U/L (ref 15–41)
Albumin: 3.8 g/dL (ref 3.5–5.0)
Alkaline Phosphatase: 95 U/L (ref 38–126)
Anion gap: 8 (ref 5–15)
BUN: 14 mg/dL (ref 8–23)
CO2: 23 mmol/L (ref 22–32)
Calcium: 9.2 mg/dL (ref 8.9–10.3)
Chloride: 108 mmol/L (ref 98–111)
Creatinine, Ser: 1.23 mg/dL — ABNORMAL HIGH (ref 0.44–1.00)
GFR, Estimated: 43 mL/min — ABNORMAL LOW (ref >60)
Glucose, Bld: 102 mg/dL — ABNORMAL HIGH (ref 70–99)
Potassium: 3.3 mmol/L — ABNORMAL LOW (ref 3.5–5.1)
Sodium: 139 mmol/L (ref 135–145)
Total Bilirubin: 0.7 mg/dL (ref 0.3–1.2)
Total Protein: 7.9 g/dL (ref 6.5–8.1)

## 2021-01-06 LAB — URINALYSIS, ROUTINE W REFLEX MICROSCOPIC
Bilirubin Urine: NEGATIVE
Glucose, UA: NEGATIVE mg/dL
Hgb urine dipstick: NEGATIVE
Ketones, ur: NEGATIVE mg/dL
Leukocytes,Ua: NEGATIVE
Nitrite: NEGATIVE
Protein, ur: NEGATIVE mg/dL
Specific Gravity, Urine: 1.005 (ref 1.005–1.030)
pH: 7 (ref 5.0–8.0)

## 2021-01-06 LAB — LACTIC ACID, PLASMA
Lactic Acid, Venous: 1.1 mmol/L (ref 0.5–1.9)
Lactic Acid, Venous: 1.5 mmol/L (ref 0.5–1.9)

## 2021-01-06 LAB — RESP PANEL BY RT-PCR (FLU A&B, COVID) ARPGX2
Influenza A by PCR: NEGATIVE
Influenza B by PCR: NEGATIVE
SARS Coronavirus 2 by RT PCR: NEGATIVE

## 2021-01-06 LAB — APTT: aPTT: 31 seconds (ref 24–36)

## 2021-01-06 MED ORDER — ACETAMINOPHEN 160 MG/5ML PO SOLN: 650.0000 mg | ORAL | Status: DC | PRN

## 2021-01-06 MED ORDER — LEVETIRACETAM 250 MG PO TABS
250.0000 mg | ORAL_TABLET | Freq: Two times a day (BID) | ORAL | Status: DC
  Administered 2021-01-06 – 2021-01-07 (×2): 250 mg via ORAL
  Filled 2021-01-06 (×2): qty 1

## 2021-01-06 MED ORDER — HEPARIN SODIUM (PORCINE) 5000 UNIT/ML IJ SOLN
5000.0000 [IU] | Freq: Three times a day (TID) | INTRAMUSCULAR | Status: DC
  Administered 2021-01-06 – 2021-01-07 (×3): 5000 [IU] via SUBCUTANEOUS
  Filled 2021-01-06 (×3): qty 1

## 2021-01-06 MED ORDER — STROKE: EARLY STAGES OF RECOVERY BOOK
Freq: Once | Status: DC
  Filled 2021-01-06: qty 1

## 2021-01-06 MED ORDER — ATORVASTATIN CALCIUM 40 MG PO TABS
80.0000 mg | ORAL_TABLET | Freq: Every day | ORAL | Status: DC
  Administered 2021-01-07: 80 mg via ORAL
  Filled 2021-01-06: qty 2

## 2021-01-06 MED ORDER — SENNOSIDES-DOCUSATE SODIUM 8.6-50 MG PO TABS: 1.0000 | ORAL_TABLET | Freq: Every evening | ORAL | Status: DC | PRN

## 2021-01-06 MED ORDER — ACETAMINOPHEN 325 MG PO TABS
650.0000 mg | ORAL_TABLET | Freq: Once | ORAL | Status: AC
  Administered 2021-01-06: 650 mg via ORAL
  Filled 2021-01-06: qty 2

## 2021-01-06 MED ORDER — ASPIRIN EC 325 MG PO TBEC
325.0000 mg | DELAYED_RELEASE_TABLET | Freq: Every day | ORAL | Status: DC
  Administered 2021-01-07: 325 mg via ORAL
  Filled 2021-01-06: qty 1

## 2021-01-06 MED ORDER — POTASSIUM CHLORIDE 10 MEQ/100ML IV SOLN
10.0000 meq | INTRAVENOUS | Status: AC
Start: 2021-01-06 — End: 2021-01-07
  Administered 2021-01-06 – 2021-01-07 (×3): 10 meq via INTRAVENOUS
  Filled 2021-01-06 (×3): qty 100

## 2021-01-06 MED ORDER — ASPIRIN 325 MG PO TABS
325.0000 mg | ORAL_TABLET | Freq: Once | ORAL | Status: AC
  Administered 2021-01-06: 325 mg via ORAL
  Filled 2021-01-06: qty 1

## 2021-01-06 MED ORDER — TOPIRAMATE 25 MG PO TABS
25.0000 mg | ORAL_TABLET | Freq: Two times a day (BID) | ORAL | Status: DC
  Administered 2021-01-06 – 2021-01-07 (×2): 25 mg via ORAL
  Filled 2021-01-06 (×2): qty 1

## 2021-01-06 MED ORDER — SODIUM CHLORIDE 0.9 % IV SOLN
2.0000 g | Freq: Once | INTRAVENOUS | Status: AC
  Administered 2021-01-06: 2 g via INTRAVENOUS
  Filled 2021-01-06: qty 20

## 2021-01-06 MED ORDER — MECLIZINE HCL 12.5 MG PO TABS: 25.0000 mg | ORAL_TABLET | Freq: Three times a day (TID) | ORAL | Status: DC | PRN

## 2021-01-06 MED ORDER — GABAPENTIN 300 MG PO CAPS
300.0000 mg | ORAL_CAPSULE | Freq: Three times a day (TID) | ORAL | Status: DC
  Administered 2021-01-06 – 2021-01-07 (×2): 300 mg via ORAL
  Filled 2021-01-06 (×2): qty 1

## 2021-01-06 MED ORDER — ACETAMINOPHEN 325 MG PO TABS: 650.0000 mg | ORAL_TABLET | ORAL | Status: DC | PRN

## 2021-01-06 MED ORDER — LORAZEPAM 2 MG/ML IJ SOLN
2.0000 mg | INTRAMUSCULAR | Status: DC | PRN
Start: 2021-01-06 — End: 2021-01-07

## 2021-01-06 MED ORDER — SODIUM CHLORIDE 0.9 % IV BOLUS
1000.0000 mL | Freq: Once | INTRAVENOUS | Status: AC
  Administered 2021-01-06: 1000 mL via INTRAVENOUS

## 2021-01-06 MED ORDER — AMLODIPINE BESYLATE 5 MG PO TABS
5.0000 mg | ORAL_TABLET | Freq: Every day | ORAL | Status: DC
  Administered 2021-01-07: 5 mg via ORAL
  Filled 2021-01-06: qty 1

## 2021-01-06 MED ORDER — ACETAMINOPHEN 650 MG RE SUPP: 650.0000 mg | RECTAL | Status: DC | PRN

## 2021-01-06 MED ORDER — METOPROLOL TARTRATE 25 MG PO TABS
25.0000 mg | ORAL_TABLET | Freq: Two times a day (BID) | ORAL | Status: DC
  Administered 2021-01-06 – 2021-01-07 (×2): 25 mg via ORAL
  Filled 2021-01-06 (×2): qty 1

## 2021-01-06 NOTE — ED Notes (Signed)
Pt Purewick leaked. Pt stood while linens were changed.

## 2021-01-06 NOTE — ED Triage Notes (Signed)
Ems reports pt lives alone and daughter spoke to her on the phone this morning.  Reports pt was normal over the phone at 9:15 this morning.  Reports daughter came to the house around 3pm today and found pt sitting on the bed and had altered mental status.  Says she also noticed she hadn't taken her morning medications.  Reports daughter tried to get pt off of the bed but pt was very weak and couldn't stand.  EMS says they arrived, pt conscious, alert, cbg 90 and able to abswer questions.  Reports uses a cane and a walker to walk but had more difficulty than usual. EMS reports NSR on monitor.  They placed a 20g IV in left ac.  EDP at bedside.

## 2021-01-06 NOTE — ED Notes (Addendum)
Pt needed to go to the bathroom. Took pt to the restroom. Walked fine with help. Pt stated that she felt a little weak while walking to the bathroom.

## 2021-01-06 NOTE — ED Notes (Signed)
Patient transported to CT 

## 2021-01-06 NOTE — Sepsis Progress Note (Signed)
[facility] following sepsis protocol 

## 2021-01-06 NOTE — ED Provider Notes (Signed)
[facility] Provider Note   CSN: [id number] Arrival date & time: 01/06/21  1622     History No chief complaint on file.   [name] is a 86 y.o. female.  Patient was initially seen by her daughter and was confused.  By time the paramedics got here she was talking normally and did not seem confused.  Patient did have a fever at that time.  The history is provided by the patient and medical records. No language interpreter was used.  Altered Mental Status Presenting symptoms: behavior changes   Severity:  Moderate Most recent episode:  Today Episode history:  Single Timing:  Intermittent Progression:  Waxing and waning Chronicity:  New Context: not alcohol use   Associated symptoms: no abdominal pain, no hallucinations, no headaches, no rash and no seizures       Past Medical History:  Diagnosis Date   Allergy    Arthritis    Colon cancer ([place])    colon ca dx 07/30/09   History of cardiac monitoring 07/2017   "Event monitor demonstrated sinus rhythm with isolated PACs and no arrhythmias"   History of colon cancer 06/2009   found at time of TCS 06/29/09, 1.2cm sessile cecal polyp, no adjuvent therapy needed   HTN (hypertension)    Hx of cardiovascular stress test 07/2017   "No diagnostic ST segment changes to indicate ischemia. Small, moderate intensity, reversible apical to basal inferolateral defect consistent with ischemia. This is a low risk study. Nuclear stress EF: 84%."   Hyperlipidemia    Hypothyroidism    PE (pulmonary thromboembolism) ([place])    Renal disorder    cyst on kidney    Silent micro-hemorrhage of brain ([place]) 11/25/2018   Stroke ([place])    TIA (transient ischemic attack) 11/25/2018   Vertigo     Patient Active Problem List   Diagnosis Date Noted   Encounter for general adult medical examination with abnormal findings 12/15/2020   Leg swelling 11/09/2020   Headache 08/12/2020   Prediabetes 03/13/2019   Complex  partial seizure ([place]) 03/02/2019   Left hemiparesis ([place]) 03/01/2019   PVC (premature ventricular contraction) 11/16/2018   DDD (degenerative disc disease), cervical 08/16/2018   Hyperlipemia 08/16/2018   Personal history of pulmonary embolism 04/26/2018   Insomnia 12/15/2015   Paresthesia of left upper and lower extremity 11/21/2015   Left leg weakness 11/21/2015   Essential hypertension 11/02/2015   Hypothyroidism 11/02/2015   CKD (chronic kidney disease), stage III ([place]) 11/02/2015   Loss of weight 11/02/2015   OA (osteoarthritis) of knee 11/02/2015   Colon cancer ([place]) 02/28/2011   History of colon cancer 06/29/2010   Constipation 06/10/2009    Past Surgical History:  Procedure Laterality Date   ABDOMINAL HYSTERECTOMY     COLON SURGERY  07/2009   right hemicolectomy, no residual colon cancer on path   COLONOSCOPY  07/16/2010   [id number] POLYP-TCS 3 YEARS   COLONOSCOPY N/A 08/02/2013   hyperplastic polyps, surveillance in 2020 if benefits outweight the risks   COLONOSCOPY  06/2009   1.2 cm sessile cecal polyp which had adenocarcinoma arising in a tubular adenoma.   PARTIAL THYMECTOMY     partial thyroidectomy     benign tumors     OB History     Gravida  3   Para  2   Term  1   Preterm  1   AB  1   Living  2      SAB  1   IAB      Ectopic      Multiple      Live Births              Family History  Problem Relation Age of Onset   Colon cancer Mother        >age32   Arthritis Mother    Cancer Mother    Heart disease Mother    Hyperlipidemia Mother    Hypertension Mother    Heart attack Father    Heart disease Father    Diabetes Maternal Aunt    Hyperlipidemia Daughter    Hypertension Daughter    Liver disease Neg Hx     Social History   Tobacco Use   Smoking status: Never   Smokeless tobacco: Never  Vaping Use   Vaping Use: Never used  Substance Use Topics   Alcohol use: No    Alcohol/week: 0.0 standard drinks   Drug  use: No    Home Medications Prior to Admission medications   Medication Sig Start Date End Date Taking? Authorizing Provider  acetaminophen (TYLENOL) 500 MG tablet Take 1,000 mg by mouth every 8 (eight) hours as needed for mild pain or headache.   Yes [provider]  amLODipine (NORVASC) 5 MG tablet Take 1 tablet (5 mg total) by mouth daily. 09/22/20  Yes Lindell Spar, MD  ascorbic acid (VITAMIN C) 250 MG CHEW Chew 250 mg by mouth daily.   Yes [provider]  ASPERCREME LIDOCAINE EX Apply 1 application topically daily as needed (for knee pain).    Yes [provider]  aspirin 325 MG EC tablet Take 325 mg by mouth daily.   Yes [provider]  atorvastatin (LIPITOR) 80 MG tablet Take 1 tablet (80 mg total) by mouth daily. 08/12/20  Yes Lindell Spar, MD  cholecalciferol (VITAMIN D3) 25 MCG (1000 UT) tablet Take 1,000 Units by mouth daily.    Yes [provider]  gabapentin (NEURONTIN) 300 MG capsule Take 300 mg by mouth 3 (three) times daily.   Yes [provider]  levETIRAcetam (KEPPRA) 500 MG tablet Take 250 mg by mouth 2 (two) times daily.   Yes [provider]  LINZESS 72 MCG capsule TAKE (1) CAPSULE BY MOUTH DAILY AS NEEDED FOR CONSTIPATION. Patient taking differently: Take 72 mcg by mouth daily as needed (constipation). 10/26/20  Yes Lindell Spar, MD  loratadine (CLARITIN) 10 MG tablet Take 10 mg by mouth daily.    Yes [provider]  meclizine (ANTIVERT) 25 MG tablet Take 1 tablet (25 mg total) by mouth 3 (three) times daily as needed for dizziness. 11/03/20  Yes Delo, Nathaneil Canary, MD  metoprolol tartrate (LOPRESSOR) 25 MG tablet Take 37.5 mg (1 1/2 tablets) am and 50 mg (2 tablets) pm, May take an additional 25 mg daily for palpitations Patient taking differently: Take 25 mg by mouth 2 (two) times daily. Take 50 mg in the morning and 50 mg every evening palpitations 06/23/20  Yes Branch, Alphonse Guild, MD  RESTASIS 0.05  % ophthalmic emulsion Place 1 drop into both eyes 2 (two) times daily as needed (chronic dry eye). 05/17/10  Yes [provider]  topiramate (TOPAMAX) 50 MG tablet Take 25 mg by mouth 2 (two) times daily. 01/01/21  Yes [provider]  traMADol (ULTRAM) 50 MG tablet Take 25 mg by mouth every 8 (eight) hours as needed for moderate pain. 01/10/19  Yes [provider]  levETIRAcetam (KEPPRA) 250 MG tablet Take 125 mg by mouth 2 (two) times daily. Patient not taking: No sig reported 01/01/21   [provider]  topiramate (TOPAMAX) 25 MG tablet Take 50 mg by mouth 2 (two) times daily. Patient not taking: No sig reported 06/02/20   [provider]    Allergies    Tape  Review of Systems   Review of Systems  Constitutional:  Negative for appetite change and fatigue.  HENT:  Negative for congestion, ear discharge and sinus pressure.   Eyes:  Negative for discharge.  Respiratory:  Negative for cough.   Cardiovascular:  Negative for chest pain.  Gastrointestinal:  Negative for abdominal pain and diarrhea.  Genitourinary:  Negative for frequency and hematuria.  Musculoskeletal:  Negative for back pain.  Skin:  Negative for rash.  Neurological:  Negative for seizures and headaches.  Psychiatric/Behavioral:  Negative for hallucinations.    Physical Exam Updated Vital Signs BP (!) 133/59 (BP Location: Right Arm)   Pulse 80   Temp 98.9 F (37.2 C) (Oral)   Resp 15   Ht 5\' 4"  (1.626 m)   Wt 76.7 kg   SpO2 100%   BMI 29.03 kg/m   Physical Exam Vitals and nursing note reviewed.  Constitutional:      Appearance: She is well-developed.  HENT:     Head: Normocephalic.     Nose: Nose normal.  Eyes:     General: No scleral icterus.    Conjunctiva/sclera: Conjunctivae normal.  Neck:     Thyroid: No thyromegaly.  Cardiovascular:     Rate and Rhythm: Normal rate and regular rhythm.     Heart sounds: No murmur heard.   No friction rub. No gallop.   Pulmonary:     Breath sounds: No stridor. No wheezing or rales.  Chest:     Chest wall: No tenderness.  Abdominal:     General: There is no distension.     Tenderness: There is no abdominal tenderness. There is no rebound.  Musculoskeletal:        General: Normal range of motion.     Cervical back: Neck supple.  Lymphadenopathy:     Cervical: No cervical adenopathy.  Skin:    Findings: No erythema or rash.  Neurological:     Mental Status: She is alert and oriented to person, place, and time.     Motor: No abnormal muscle tone.     Coordination: Coordination normal.  Psychiatric:        Behavior: Behavior normal.    ED Results / Procedures / Treatments   Labs (all labs ordered are listed, but only abnormal results are displayed) Labs Reviewed  COMPREHENSIVE METABOLIC PANEL - Abnormal; Notable for the following components:      Result Value   Potassium 3.3 (*)    Glucose, Bld 102 (*)    Creatinine, Ser 1.23 (*)    GFR, Estimated 43 (*)    All other components within normal limits  CBC WITH DIFFERENTIAL/PLATELET - Abnormal; Notable for the following components:   WBC 11.8 (*)    MCV 101.2 (*)    Neutro Abs 9.1 (*)    All other components within normal limits  URINALYSIS, ROUTINE W REFLEX MICROSCOPIC - Abnormal; Notable for the following components:   Color, Urine STRAW (*)    All other components within normal limits  RESP PANEL BY RT-PCR (FLU A&B, COVID) ARPGX2  CULTURE, BLOOD (ROUTINE X 2)  CULTURE, BLOOD (ROUTINE X 2)  URINE CULTURE  LACTIC ACID, PLASMA  LACTIC ACID, PLASMA  PROTIME-INR  APTT    EKG None  Radiology DG Chest Port 1 View  Result Date: 01/06/2021 CLINICAL DATA:  Questionable sepsis. EXAM: PORTABLE CHEST 1 VIEW COMPARISON:  Chest x-ray 08/15/2020. FINDINGS: Heart is tortuous, unchanged. The heart is mildly enlarged, unchanged. There is no lung consolidation, pleural effusion or pneumothorax. There is minimal bibasilar atelectasis. No acute  fractures are seen. IMPRESSION: 1. No evidence for pneumonia or edema. 2. Stable cardiomegaly. Electronically Signed   By: Ronney Asters M.D.   On: 01/06/2021 17:59    Procedures Procedures   Medications Ordered in ED Medications  sodium chloride 0.9 % bolus 1,000 mL (0 mLs Intravenous Stopped 01/06/21 1858)  cefTRIAXone (ROCEPHIN) 2 g in sodium chloride 0.9 % 100 mL IVPB (0 g Intravenous Stopped 01/06/21 1815)  acetaminophen (TYLENOL) tablet 650 mg (650 mg Oral Given 01/06/21 1736)  aspirin tablet 325 mg (325 mg Oral Given by Other 01/06/21 2226)  CRITICAL CARE Performed by: Milton Ferguson Total critical care time: 40 minutes Critical care time was exclusive of separately billable procedures and treating other patients. Critical care was necessary to treat or prevent imminent or life-threatening deterioration. Critical care was time spent personally by me on the following activities: development of treatment plan with patient and/or surrogate as well as nursing, discussions with consultants, evaluation of patient's response to treatment, examination of patient, obtaining history from patient or surrogate, ordering and performing treatments and interventions, ordering and review of laboratory studies, ordering and review of radiographic studies, pulse oximetry and re-evaluation of patient's condition.    ED Course  I have reviewed the triage vital signs and the nursing notes.  Pertinent labs & imaging results that were available during my care of the patient were reviewed by me and considered in my medical decision making (see chart for details). Initially patient was worked up for fever and altered mental status that had resolved.  When I spoke with the daughter later it sounded more like a TIA the patient was having.  Her fever work-up was negative.  Patient was back to her normal.  She will be admitted for TIA work-up.  CT head pending   MDM Rules/Calculators/A&P                            Altered mental status Final Clinical Impression(s) / ED Diagnoses Final diagnoses:  Disorientation    Rx / DC Orders ED Discharge Orders     None        Milton Ferguson, MD 01/09/21 1257

## 2021-01-06 NOTE — H&P (Addendum)
History and Physical  [facility]  [name] LOV:[id number] DOB: [birth year] DOA: 01/06/2021  PCP: Lindell Spar, MD  Patient coming from: home Level of care: Telemetry  I have personally briefly reviewed patient's old medical records in [place]  Chief Complaint: Acute metabolic encephalopathy  HPI: [name] is a 86 y.o. female with medical history significant for HTN, prediabetes, CVA with residual left sides numbness and weakness, ?partial seizure, hypothyroidism, colon cancer s/p right hemicolectomy, PE and CKD stage 3 who presents he presents to the ED by EMS with altered mental status.  Patient states that she lives alone.  She states she last talked to her daughter at 9:15 p.m. yesterday and texted her earlier this morning.  Patient states that her daughter usually calls her around 2:00 PM to remind her to take her medications, but she was sleeping in her chair in her living room when her daughter called.  Around 3 PM she heard knocking on her door it was her daughter.  She states that her daughter noticed that she had missed some of her medications.  She states that her daughter thought that she was confused and weak so they called EMS.  Patient states that she was just tired after waking up from a nap.  Patient does not endorse any seizure activity, trauma, or fall. Although patient does take Keppra for seizures, patient does not remember being diagnosed with any seizure disorders.  She does not report any postictal state during the event that led to the patient's presentation to the ED. Chart review show that patient was started on Keppra in 02/2019 due to shaking and numbness of the at that time. When EMS arrived patient was conscious, alert and able to answer questions.  Upon arrival to the ED, patient was febrile for temperature of 100.9; temperature reducible to 98.9 with Tylenol. EKG showed sinus rhythm, heart rate of 85 bpm, QTC 545.  CT head  showed no acute intracranial abnormalities.  Patient currently denies chest pains, shortness of breath, headaches, fever or chills.   ED Course: Upon presentation to the ED patient was febrile with a temperature of 100.9.  Patient was given Tylenol and fever was reducible 98.9.  Patient was was started on ceftriaxone.  Head CT showed no acute intracranial abnormalities.  Patient was given aspirin.  Patient was given normal saline bolus.  Urine culture was collected.  Review of Systems: Review of Systems  Constitutional: Negative.   HENT: Negative.    Eyes: Negative.   Respiratory: Negative.    Cardiovascular:  Negative for chest pain and palpitations.  Gastrointestinal: Negative.   Genitourinary: Negative.   Musculoskeletal: Negative.   Skin: Negative.   Neurological:  Positive for focal weakness (Slight left-sided weakness present prior to current events). Negative for dizziness, tingling, tremors, sensory change, speech change, seizures, loss of consciousness and headaches.  Endo/Heme/Allergies: Negative.   Psychiatric/Behavioral: Negative.      Past Medical History:  Diagnosis Date   Allergy    Arthritis    Colon cancer ([place])    colon ca dx 07/30/09   History of cardiac monitoring 07/2017   "Event monitor demonstrated sinus rhythm with isolated PACs and no arrhythmias"   History of colon cancer 06/2009   found at time of TCS 06/29/09, 1.2cm sessile cecal polyp, no adjuvent therapy needed   HTN (hypertension)    Hx of cardiovascular stress test 07/2017   "No diagnostic ST segment changes to indicate ischemia. Small, moderate  intensity, reversible apical to basal inferolateral defect consistent with ischemia. This is a low risk study. Nuclear stress EF: 84%."   Hyperlipidemia    Hypothyroidism    PE (pulmonary thromboembolism) ([place])    Renal disorder    cyst on kidney    Silent micro-hemorrhage of brain ([place]) 11/25/2018   Stroke [facility])    TIA (transient ischemic attack) 11/25/2018    Vertigo     Past Surgical History:  Procedure Laterality Date   ABDOMINAL HYSTERECTOMY     COLON SURGERY  07/2009   right hemicolectomy, no residual colon cancer on path   COLONOSCOPY  07/16/2010   [facility]:[id number] POLYP-TCS 3 YEARS   COLONOSCOPY N/A 08/02/2013   hyperplastic polyps, surveillance in 2020 if benefits outweight the risks   COLONOSCOPY  06/2009   1.2 cm sessile cecal polyp which had adenocarcinoma arising in a tubular adenoma.   PARTIAL THYMECTOMY     partial thyroidectomy     benign tumors     reports that she has never smoked. She has never used smokeless tobacco. She reports that she does not drink alcohol and does not use drugs.  Allergies  Allergen Reactions   Tape Rash    Zio monitor adhesive causes ulcerated and infected skin .Had to see derm    Family History  Problem Relation Age of Onset   Colon cancer Mother        >age15   Arthritis Mother    Cancer Mother    Heart disease Mother    Hyperlipidemia Mother    Hypertension Mother    Heart attack Father    Heart disease Father    Diabetes Maternal Aunt    Hyperlipidemia Daughter    Hypertension Daughter    Liver disease Neg Hx     Prior to Admission medications   Medication Sig Start Date End Date Taking? Authorizing Provider  acetaminophen (TYLENOL) 500 MG tablet Take 1,000 mg by mouth every 8 (eight) hours as needed for mild pain or headache.   Yes [provider]  amLODipine (NORVASC) 5 MG tablet Take 1 tablet (5 mg total) by mouth daily. 09/22/20  Yes Lindell Spar, MD  ascorbic acid (VITAMIN C) 250 MG CHEW Chew 250 mg by mouth daily.   Yes [provider]  ASPERCREME LIDOCAINE EX Apply 1 application topically daily as needed (for knee pain).    Yes [provider]  aspirin 325 MG EC tablet Take 325 mg by mouth daily.   Yes [provider]  atorvastatin (LIPITOR) 80 MG tablet Take 1 tablet (80 mg total) by mouth daily. 08/12/20  Yes Lindell Spar, MD   cholecalciferol (VITAMIN D3) 25 MCG (1000 UT) tablet Take 1,000 Units by mouth daily.    Yes [provider]  gabapentin (NEURONTIN) 300 MG capsule Take 300 mg by mouth 3 (three) times daily.   Yes [provider]  levETIRAcetam (KEPPRA) 500 MG tablet Take 250 mg by mouth 2 (two) times daily.   Yes [provider]  LINZESS 72 MCG capsule TAKE (1) CAPSULE BY MOUTH DAILY AS NEEDED FOR CONSTIPATION. Patient taking differently: Take 72 mcg by mouth daily as needed (constipation). 10/26/20  Yes Lindell Spar, MD  loratadine (CLARITIN) 10 MG tablet Take 10 mg by mouth daily.    Yes [provider]  meclizine (ANTIVERT) 25 MG tablet Take 1 tablet (25 mg total) by mouth 3 (three) times daily as needed for dizziness. 11/03/20  Yes Veryl Speak, MD  metoprolol tartrate (LOPRESSOR) 25 MG tablet Take 37.5 mg (1 1/2 tablets) am and 50 mg (2 tablets) pm, May take an additional 25 mg daily for palpitations Patient taking differently: Take 25 mg by mouth 2 (two) times daily. Take 50 mg in the morning and 50 mg every evening palpitations 06/23/20  Yes Branch, Alphonse Guild, MD  RESTASIS 0.05 % ophthalmic emulsion Place 1 drop into both eyes 2 (two) times daily as needed (chronic dry eye). 05/17/10  Yes [provider]  topiramate (TOPAMAX) 50 MG tablet Take 25 mg by mouth 2 (two) times daily. 01/01/21  Yes [provider]  traMADol (ULTRAM) 50 MG tablet Take 25 mg by mouth every 8 (eight) hours as needed for moderate pain. 01/10/19  Yes [provider]  levETIRAcetam (KEPPRA) 250 MG tablet Take 125 mg by mouth 2 (two) times daily. Patient not taking: No sig reported 01/01/21   [provider]  topiramate (TOPAMAX) 25 MG tablet Take 50 mg by mouth 2 (two) times daily. Patient not taking: No sig reported 06/02/20   [provider]    Physical Exam: Vitals:   01/06/21 2130 01/06/21 2205 01/06/21 2210 01/06/21 2343  BP: (!) 147/71  (!)  133/59 (!) 143/76  Pulse: 77 80 80   Resp: 18 15 15    Temp:      TempSrc:      SpO2: 98% 100% 100%   Weight:      Height:        Constitutional: NAD, calm, comfortable Eyes: PERRL, lids and conjunctivae normal ENMT: Mucous membranes are moist. Posterior pharynx clear of any exudate or lesions.Normal dentition.  Neck: normal, supple, no masses, no thyromegaly Respiratory: clear to auscultation bilaterally, no wheezing, no crackles. Normal respiratory effort. No accessory muscle use.  Cardiovascular: normal s1, s2 sounds, no murmurs / rubs / gallops. No extremity edema. 2+ pedal pulses. No carotid bruits.  Abdomen: no tenderness, no masses palpated. No hepatosplenomegaly. Bowel sounds positive.  Musculoskeletal: no clubbing / cyanosis. No joint deformity upper and lower extremities. Good ROM, no contractures. Normal muscle tone.  Skin: no rashes, lesions, ulcers. No induration Neurologic: CN 2-12 grossly intact. Sensation intact, DTR normal.  Patient slightly weaker on her left side, but strength roughly 5/5 in all 4.  Psychiatric: Normal judgment and insight. Alert and oriented x 3. Normal mood.   Labs on Admission: I have personally reviewed following labs and imaging studies  CBC: Recent Labs  Lab 01/06/21 1712  WBC 11.8*  NEUTROABS 9.1*  HGB 13.6  HCT 42.7  MCV 101.2*  PLT 161   Basic Metabolic Panel: Recent Labs  Lab 01/06/21 1712  NA 139  K 3.3*  CL 108  CO2 23  GLUCOSE 102*  BUN 14  CREATININE 1.23*  CALCIUM 9.2   GFR: Estimated Creatinine Clearance: 34.1 mL/min (A) (by C-G formula based on SCr of 1.23 mg/dL (H)). Liver Function Tests: Recent Labs  Lab 01/06/21 1712  AST 18  ALT 16  ALKPHOS 95  BILITOT 0.7  PROT 7.9  ALBUMIN 3.8   No results for input(s): LIPASE, AMYLASE in the last 168 hours. No results for input(s): AMMONIA in the last 168 hours. Coagulation Profile: Recent Labs  Lab 01/06/21 1712  INR 1.1   Cardiac Enzymes: No results for  input(s): CKTOTAL, CKMB, CKMBINDEX, TROPONINI in the last 168 hours. BNP (last 3 results) No results for input(s): PROBNP in the last 8760 hours. HbA1C: No results for input(s): HGBA1C in the last  72 hours. CBG: No results for input(s): GLUCAP in the last 168 hours. Lipid Profile: No results for input(s): CHOL, HDL, LDLCALC, TRIG, CHOLHDL, LDLDIRECT in the last 72 hours. Thyroid Function Tests: No results for input(s): TSH, T4TOTAL, FREET4, T3FREE, THYROIDAB in the last 72 hours. Anemia Panel: No results for input(s): VITAMINB12, FOLATE, FERRITIN, TIBC, IRON, RETICCTPCT in the last 72 hours. Urine analysis:    Component Value Date/Time   COLORURINE STRAW (A) 01/06/2021 1732   APPEARANCEUR CLEAR 01/06/2021 1732   LABSPEC 1.005 01/06/2021 1732   PHURINE 7.0 01/06/2021 1732   GLUCOSEU NEGATIVE 01/06/2021 1732   HGBUR NEGATIVE 01/06/2021 1732   BILIRUBINUR NEGATIVE 01/06/2021 1732   KETONESUR NEGATIVE 01/06/2021 1732   PROTEINUR NEGATIVE 01/06/2021 1732   UROBILINOGEN 0.2 03/12/2012 0022   NITRITE NEGATIVE 01/06/2021 1732   LEUKOCYTESUR NEGATIVE 01/06/2021 1732    Radiological Exams on Admission: CT HEAD WO CONTRAST (5MM)  Result Date: 01/06/2021 CLINICAL DATA:  TIA.  Weakness. EXAM: CT HEAD WITHOUT CONTRAST TECHNIQUE: Contiguous axial images were obtained from the base of the skull through the vertex without intravenous contrast. COMPARISON:  CT head 11/02/2020. FINDINGS: Brain: No evidence of acute infarction, hemorrhage, hydrocephalus, extra-axial collection or mass lesion/mass effect. Again seen is mild diffuse atrophy and mild periventricular white matter hypodensity, likely chronic small vessel ischemic change. Vascular: Atherosclerotic calcifications are present within the cavernous internal carotid arteries. Skull: Normal. Negative for fracture or focal lesion. Sinuses/Orbits: No acute finding. Other: None. IMPRESSION: No acute intracranial abnormality. Stable diffuse atrophy a  and mild chronic small vessel ischemic change. Electronically Signed   By: Ronney Asters M.D.   On: 01/06/2021 23:09   DG Chest Port 1 View  Result Date: 01/06/2021 CLINICAL DATA:  Questionable sepsis. EXAM: PORTABLE CHEST 1 VIEW COMPARISON:  Chest x-ray 08/15/2020. FINDINGS: Heart is tortuous, unchanged. The heart is mildly enlarged, unchanged. There is no lung consolidation, pleural effusion or pneumothorax. There is minimal bibasilar atelectasis. No acute fractures are seen. IMPRESSION: 1. No evidence for pneumonia or edema. 2. Stable cardiomegaly. Electronically Signed   By: Ronney Asters M.D.   On: 01/06/2021 17:59    EKG: Independently reviewed.   Assessment/Plan Active Problems:   Essential hypertension   Hypothyroidism   TIA (transient ischemic attack)   Hypokalemia   Acute metabolic encephalopathy     1) Acute Metabolic Encephalopathy - CT head showed no acute abnormalities. - MRI head in the a.m. -Echocardiogram ordered for tomorrow morning.   2) Hypokalemia - Potassium of 3.3 -IV potassium chloride 10 mEq given -Repeat CMP in the a.m.   3) Prediabetes Hemoglobin A1c ordered   4) Hypothyroidism -Per chart review patient has history of hypothyroidism -TSH ordered  5) Hypertension -Continue home amlodipine 5 mg -Continue home atorvastatin 80 mg -Metoprolol 25 mg twice daily -Lipid panel ordered   6) Leukocytosis -WBC of 11.8 -Culture and urine culture pending -Urinalysis negative for nitrates and leukocytes -Patient was started on ceftriaxone in ED.  Medication was discontinued.  7)?  Partial seizures  - Continue home Keppra - Continue home gabapentin 300 mg 3 times daily    DVT prophylaxis: Heparin Code Status: DNR Family Communication: No family at bedside Consults called: N/A Admission status: Observation Level of care: Telemetry Valentino Nose Sweet MS 4  01/07/2021, 1:15 AM

## 2021-01-07 ENCOUNTER — Observation Stay (HOSPITAL_BASED_OUTPATIENT_CLINIC_OR_DEPARTMENT_OTHER): Payer: Medicare Other

## 2021-01-07 ENCOUNTER — Observation Stay (HOSPITAL_COMMUNITY): Payer: Medicare Other

## 2021-01-07 DIAGNOSIS — G459 Transient cerebral ischemic attack, unspecified: Principal | ICD-10-CM

## 2021-01-07 DIAGNOSIS — Z8673 Personal history of transient ischemic attack (TIA), and cerebral infarction without residual deficits: Secondary | ICD-10-CM | POA: Diagnosis not present

## 2021-01-07 DIAGNOSIS — R531 Weakness: Secondary | ICD-10-CM | POA: Diagnosis not present

## 2021-01-07 DIAGNOSIS — G40909 Epilepsy, unspecified, not intractable, without status epilepticus: Secondary | ICD-10-CM | POA: Diagnosis not present

## 2021-01-07 DIAGNOSIS — I6522 Occlusion and stenosis of left carotid artery: Secondary | ICD-10-CM | POA: Diagnosis not present

## 2021-01-07 DIAGNOSIS — F05 Delirium due to known physiological condition: Secondary | ICD-10-CM

## 2021-01-07 LAB — ECHOCARDIOGRAM COMPLETE
AR max vel: 2.13 cm2
AV Area VTI: 2.27 cm2
AV Area mean vel: 2.08 cm2
AV Mean grad: 4 mmHg
AV Peak grad: 7.4 mmHg
Ao pk vel: 1.36 m/s
Area-P 1/2: 2.61 cm2
Height: 64 in
MV VTI: 2.74 cm2
S' Lateral: 3.1 cm
Weight: 2706 oz

## 2021-01-07 LAB — LIPID PANEL
Cholesterol: 122 mg/dL (ref 0–200)
HDL: 52 mg/dL (ref >40)
LDL Cholesterol: 61 mg/dL (ref 0–99)
Total CHOL/HDL Ratio: 2.3 RATIO
Triglycerides: 43 mg/dL (ref <150)
VLDL: 9 mg/dL (ref 0–40)

## 2021-01-07 LAB — RAPID URINE DRUG SCREEN, HOSP PERFORMED
Amphetamines: NOT DETECTED
Barbiturates: NOT DETECTED
Benzodiazepines: NOT DETECTED
Cocaine: NOT DETECTED
Opiates: NOT DETECTED
Tetrahydrocannabinol: NOT DETECTED

## 2021-01-07 LAB — PROCALCITONIN: Procalcitonin: <0.1 ng/mL

## 2021-01-07 LAB — URINE CULTURE: Culture: NO GROWTH

## 2021-01-07 LAB — TSH: TSH: 0.357 u[IU]/mL (ref 0.350–4.500)

## 2021-01-07 LAB — HEMOGLOBIN A1C
Hgb A1c MFr Bld: 6.1 % — ABNORMAL HIGH (ref 4.8–5.6)
Mean Plasma Glucose: 128.37 mg/dL

## 2021-01-07 MED ORDER — METOPROLOL TARTRATE 25 MG PO TABS: 25.0000 mg | ORAL_TABLET | Freq: Two times a day (BID) | ORAL | Status: DC

## 2021-01-07 NOTE — Progress Notes (Signed)
*  PRELIMINARY RESULTS* Echocardiogram 2D Echocardiogram has been performed.  [name] 01/07/2021, 9:39 AM

## 2021-01-07 NOTE — Discharge Instructions (Addendum)
Please Follow Up with your neurologist in 1-2 weeks for a recheck.   Please follow up with your primary care provider in 1 week for a recheck.   Please return if symptoms come back or new problem develops.    IMPORTANT INFORMATION: PAY CLOSE ATTENTION   PHYSICIAN DISCHARGE INSTRUCTIONS  Follow with Primary care provider  Lindell Spar, MD  and other consultants as instructed by your Hospitalist Physician  [facility] IF SYMPTOMS COME BACK, WORSEN OR NEW PROBLEM DEVELOPS   Please note: You were cared for by a hospitalist during your hospital stay. Every effort will be made to forward records to your primary care provider.  You can request that your primary care provider send for your hospital records if they have not received them.  Once you are discharged, your primary care physician will handle any further medical issues. Please note that NO REFILLS for any discharge medications will be authorized once you are discharged, as it is imperative that you return to your primary care physician (or establish a relationship with a primary care physician if you do not have one) for your post hospital discharge needs so that they can reassess your need for medications and monitor your lab values.  Please get a complete blood count and chemistry panel checked by your Primary MD at your next visit, and again as instructed by your Primary MD.  Get Medicines reviewed and adjusted: Please take all your medications with you for your next visit with your Primary MD  Laboratory/radiological data: Please request your Primary MD to go over all hospital tests and procedure/radiological results at the follow up, please ask your primary care provider to get all Hospital records sent to his/her office.  In some cases, they will be blood work, cultures and biopsy results pending at the time of your discharge. Please request that your primary care provider follow up on these  results.  If you are diabetic, please bring your blood sugar readings with you to your follow up appointment with primary care.    Please call and make your follow up appointments as soon as possible.    Also Note the following: If you experience worsening of your admission symptoms, develop shortness of breath, life threatening emergency, suicidal or homicidal thoughts you must seek medical attention immediately by calling 911 or calling your MD immediately  if symptoms less severe.  You must read complete instructions/literature along with all the possible adverse reactions/side effects for all the Medicines you take and that have been prescribed to you. Take any new Medicines after you have completely understood and accpet all the possible adverse reactions/side effects.   Do not drive when taking Pain medications or sleeping medications (Benzodiazepines)  Do not take more than prescribed Pain, Sleep and Anxiety Medications. It is not advisable to combine anxiety,sleep and pain medications without talking with your primary care practitioner  Special Instructions: If you have smoked or chewed Tobacco  in the last 2 yrs please stop smoking, stop any regular Alcohol  and or any Recreational drug use.  Wear Seat belts while driving.  Do not drive if taking any narcotic, mind altering or controlled substances or recreational drugs or alcohol.

## 2021-01-07 NOTE — Evaluation (Signed)
Occupational Therapy Evaluation Patient Details Name: [name] MRN: [id number] DOB: 09-Jan-1935 Today's Date: 01/07/2021   History of Present Illness [name] is an 86 year old female who presents to the ED with a chief complaint of altered mental status.  History is limited, as patient does not fully remember what happened.  Patient reports that she woke up from her nap to her daughter's voice.  She does not remember what her daughter was saying, patient remembers just repeating "what is wrong?  What is wrong?"  Patient reports that she thought she had been fine, but her daughter told her she had not taken her morning medications.  Patient reports it is normal for her to take a nap during the day, and her daughter usually calls her and wakes her up.  Daughter was very concerned because today when she called her to wake her up, patient did not wake up and answer the phone.  Patient reports that she did not have breakfast this morning, which is abnormal for her as well.  She reports having normal appetite, and eating dinner last night.  Patient denies any fever chills, dysuria, cough, skin rashes.  Patient does have a history of complex partial seizure per neurology note in December 2020, the patient does not recall this.  She does not recall any aura, or any seizure activity from then or from today.  Patient reports when she did wake up she was able to ambulate to the bathroom with her cane-which is how she normally ambulates, however daughter reported the patient could not get up.  She does not think there was any weakness on one side more than the other.  On presentation to the ED, patient was answering questions appropriately but did have a fever.   Clinical Impression   Co-evaluation completed with PT. Pt appears to be at or near baseline levels for ADL's and functional mobility. Pt demonstrates WFL bilateral UE functional use. Pt uses cane at baseline and did not require physical  assist today for bed or out of bed mobility. Pt able to complete vision tasks well other than convergence which was not observed during formal assessment. Pt gaze remained at midline. Pt is not recommended for further acute OT services and will be discharged to care of nursing staff for remaining length of stay.       Recommendations for follow up therapy are one component of a multi-disciplinary discharge planning process, led by the attending physician.  Recommendations may be updated based on patient status, additional functional criteria and insurance authorization.   Follow Up Recommendations  No OT follow up    Assistance Recommended at Discharge PRN  Functional Status Assessment  Patient has not had a recent decline in their functional status  Equipment Recommendations  None recommended by OT           Precautions / Restrictions Precautions Precautions: Fall Restrictions Weight Bearing Restrictions: No      Mobility Bed Mobility Overal bed mobility: Modified Independent             General bed mobility comments: mild labored movement    Transfers Overall transfer level: Modified independent Equipment used: Straight cane               General transfer comment: Pt able to ambualte in hall; transfer to chair, and retrun to bed with Mod I level of assist.      Balance Overall balance assessment: Modified Independent  ADL either performed or assessed with clinical judgement   ADL Overall ADL's : Modified independent                                       General ADL Comments: Able to doff and don sock seated in chair with extended time.     Vision Baseline Vision/History: 1 Wears glasses Ability to See in Adequate Light: 0 Adequate Patient Visual Report: No change from baseline Vision Assessment?: Yes Tracking/Visual Pursuits: Able to track stimulus in all quads without  difficulty Convergence: Impaired (comment) (No convergence realy observed from pt when prompted.)     Perception     Praxis      Pertinent Vitals/Pain Pain Assessment: No/denies pain     Hand Dominance Right   Extremity/Trunk Assessment Upper Extremity Assessment Upper Extremity Assessment: Overall WFL for tasks assessed   Lower Extremity Assessment Lower Extremity Assessment: Defer to PT evaluation   Cervical / Trunk Assessment Cervical / Trunk Assessment: Normal   Communication Communication Communication: No difficulties   Cognition Arousal/Alertness: Awake/alert Behavior During Therapy: WFL for tasks assessed/performed Overall Cognitive Status: Within Functional Limits for tasks assessed                                                        Home Living Family/patient expects to be discharged to:: Private residence Living Arrangements: Alone Available Help at Discharge: Family;Available PRN/intermittently Type of Home: House Home Access: Stairs to enter [employer] of Steps: 1 Entrance Stairs-Rails: Right;Left;Can reach both Home Layout: One level     Bathroom Shower/Tub: Teacher, early years/pre: Standard Bathroom Accessibility: No   Home Equipment: Conservation officer, nature (2 wheels);Cane - single point;BSC;Grab bars - tub/shower;Shower seat   Additional Comments: Pt reports daughter is availalbe PRN; daughter works.      Prior Functioning/Environment Prior Level of Function : Needs assist             Mobility Comments: Indepenent household ambulation with cane at most times. Pt does go to church using cane as well. ADLs Comments: Pt reports independence with ADL's. Daugter assits with IADL's of grocery shopping and cleaning.                      OT Goals(Current goals can be found in the care plan section) Acute Rehab OT Goals Patient Stated Goal: return home                   Co-evaluation  PT/OT/SLP Co-Evaluation/Treatment: Yes Reason for Co-Treatment: To address functional/ADL transfers   OT goals addressed during session: ADL's and self-care      AM-PAC OT "6 Clicks" Daily Activity     Outcome Measure Help from another person eating meals?: None Help from another person taking care of personal grooming?: None Help from another person toileting, which includes using toliet, bedpan, or urinal?: None Help from another person bathing (including washing, rinsing, drying)?: None Help from another person to put on and taking off regular upper body clothing?: None Help from another person to put on and taking off regular lower body clothing?: None 6 Click Score: 24   End of Session Equipment Utilized During Treatment:  (cane)  Activity  Tolerance: Patient tolerated treatment well Patient left: in bed;with call bell/phone within reach  OT Visit Diagnosis: Unsteadiness on feet (R26.81);Other abnormalities of gait and mobility (R26.89);Other symptoms and signs involving cognitive function                Time: 2229-7989 OT Time Calculation (min): 19 min Charges:  OT General Charges $OT Visit: 1 Visit OT Evaluation $OT Eval Low Complexity: 1 Low  Marzelle Rutten OT, MOT  Larey Seat 01/07/2021, 9:38 AM

## 2021-01-07 NOTE — ED Notes (Signed)
Patient's Pure-Wick changed

## 2021-01-07 NOTE — Discharge Summary (Signed)
Physician Discharge Summary  [name] YYQ:[id number] DOB: 27-Mar-1934 DOA: 01/06/2021  PCP: Lindell Spar, MD Neurologist: Dr. Merlene Laughter  Admit date: 01/06/2021 Discharge date: 01/07/2021  Admitted From:  HOME  Disposition: HOME   Recommendations for Outpatient Follow-up:  Follow up with PCP in 1 weeks Follow up with your neurologist Dr. Merlene Laughter in 1-2 weeks for recheck Outpatient EEG recommended.    Discharge Condition: STABLE   CODE STATUS: FULL DIET:  Heart healthy    Brief Hospitalization Summary: Please see all hospital notes, images, labs for full details of the hospitalization. ADMISSION HPI: [name] is a 86 y.o. female with medical history significant for HTN, prediabetes, CVA with residual left sides numbness and weakness, ?partial seizure, hypothyroidism, colon cancer s/p right hemicolectomy, PE and CKD stage 3 who presents he presents to the ED by EMS with altered mental status.  Patient states that she lives alone.  She states she last talked to her daughter at 9:15 p.m. yesterday and texted her earlier this morning.  Patient states that her daughter usually calls her around 2:00 PM to remind her to take her medications, but she was sleeping in her chair in her living room when her daughter called.  Around 3 PM she heard knocking on her door it was her daughter.  She states that her daughter noticed that she had missed some of her medications.  She states that her daughter thought that she was confused and weak so they called EMS.  Patient states that she was just tired after waking up from a nap.  Patient does not endorse any seizure activity, trauma, or fall. Although patient does take Keppra for seizures, patient does not remember being diagnosed with any seizure disorders.  She does not report any postictal state during the event that led to the patient's presentation to the ED. Chart review show that patient was started on Keppra in 02/2019 due to  shaking and numbness of the at that time. When EMS arrived patient was conscious, alert and able to answer questions.  Upon arrival to the ED, patient was febrile for temperature of 100.9; temperature reducible to 98.9 with Tylenol. EKG showed sinus rhythm, heart rate of 85 bpm, QTC 545.  CT head showed no acute intracranial abnormalities.  Patient currently denies chest pains, shortness of breath, headaches, fever or chills.     ED Course: Upon presentation to the ED patient was febrile with a temperature of 100.9.  Patient was given Tylenol and fever was reducible 98.9.  Patient was was started on ceftriaxone.  Head CT showed no acute intracranial abnormalities.  Patient was given aspirin.  Patient was given normal saline bolus.  Urine culture was collected.  HOSPITAL COURSE  Patient was admitted for observation.  She was thought to be having postictal symptoms from a seizure at home.  She does have a history of epilepsy and is followed by neurology and taking antiepileptics.  She rapidly improved to her baseline with some supportive therapy.  She had an MRI of the brain and there were no findings of acute ischemic infarct.  She also had other TIA work-up with carotid Doppler studies revealing no significant vascular disease, 2D echocardiogram completed with EF 65 to 70% with grade 1 diastolic dysfunction, moderately elevated pulmonary systolic pressure.  Since patient was feeling better and EEG had not been completed yet and patient has good neurology follow-up and a primary neurologist decision was made to discharge home and have outpatient follow-up with her  neurologist Dr. Merlene Laughter.  All of her antiepileptics were continued.  She continues to take aspirin 325 mg daily for prevention.  She was taking this prior to admission.  Her lipid panel reveals that her LDL was 61 and optimally controlled.  Patient had a negative procalcitonin less than 0.10.  No signs of infection were found.  Patient was seen by  physical therapy and no further PT recommendations for home health were recommended.  Patient is stable to discharge home with outpatient follow-up with her PCP and neurologist.  Discharge Diagnoses:  Active Problems:   TIA (transient ischemic attack)   Discharge Instructions:  Allergies as of 01/07/2021       Reactions   Tape Rash   Zio monitor adhesive causes ulcerated and infected skin .Had to see derm        Medication List     TAKE these medications    acetaminophen 500 MG tablet Commonly known as: TYLENOL Take 1,000 mg by mouth every 8 (eight) hours as needed for mild pain or headache.   amLODipine 5 MG tablet Commonly known as: NORVASC Take 1 tablet (5 mg total) by mouth daily.   ascorbic acid 250 MG Chew Commonly known as: VITAMIN C Chew 250 mg by mouth daily.   ASPERCREME LIDOCAINE EX Apply 1 application topically daily as needed (for knee pain).   aspirin 325 MG EC tablet Take 325 mg by mouth daily.   atorvastatin 80 MG tablet Commonly known as: LIPITOR Take 1 tablet (80 mg total) by mouth daily.   cholecalciferol 25 MCG (1000 UNIT) tablet Commonly known as: VITAMIN D3 Take 1,000 Units by mouth daily.   gabapentin 300 MG capsule Commonly known as: NEURONTIN Take 300 mg by mouth 3 (three) times daily.   levETIRAcetam 500 MG tablet Commonly known as: KEPPRA Take 250 mg by mouth 2 (two) times daily. What changed: Another medication with the same name was removed. Continue taking this medication, and follow the directions you see here.   Linzess 72 MCG capsule Generic drug: linaclotide TAKE (1) CAPSULE BY MOUTH DAILY AS NEEDED FOR CONSTIPATION. What changed: See the new instructions.   loratadine 10 MG tablet Commonly known as: CLARITIN Take 10 mg by mouth daily.   meclizine 25 MG tablet Commonly known as: ANTIVERT Take 1 tablet (25 mg total) by mouth 3 (three) times daily as needed for dizziness.   metoprolol tartrate 25 MG  tablet Commonly known as: LOPRESSOR Take 1 tablet (25 mg total) by mouth 2 (two) times daily. Take 50 mg in the morning and 50 mg every evening palpitations   Restasis 0.05 % ophthalmic emulsion Generic drug: cycloSPORINE Place 1 drop into both eyes 2 (two) times daily as needed (chronic dry eye).   topiramate 50 MG tablet Commonly known as: TOPAMAX Take 25 mg by mouth 2 (two) times daily. What changed: Another medication with the same name was removed. Continue taking this medication, and follow the directions you see here.   traMADol 50 MG tablet Commonly known as: ULTRAM Take 25 mg by mouth every 8 (eight) hours as needed for moderate pain.        Follow-up Information     Lindell Spar, MD. Schedule an appointment as soon as possible for a visit in 1 week(s).   Specialty: Internal Medicine Why: Hospital Follow Up Contact information: [address] [place] [address] [phone]         Herminio Commons, MD .   Specialty: Cardiology  Contact information: [place] [address] [phone]         Phillips Odor, MD. Schedule an appointment as soon as possible for a visit in 2 week(s).   Specialty: Neurology Why: Hospital Follow Up Contact information: [address] [place] [address] [phone]                Allergies  Allergen Reactions   Tape Rash    Zio monitor adhesive causes ulcerated and infected skin .Had to see derm   Allergies as of 01/07/2021       Reactions   Tape Rash   Zio monitor adhesive causes ulcerated and infected skin .Had to see derm        Medication List     TAKE these medications    acetaminophen 500 MG tablet Commonly known as: TYLENOL Take 1,000 mg by mouth every 8 (eight) hours as needed for mild pain or headache.   amLODipine 5 MG tablet Commonly known as: NORVASC Take 1 tablet (5 mg total) by mouth daily.   ascorbic acid 250 MG Chew Commonly known as: VITAMIN C Chew 250 mg by  mouth daily.   ASPERCREME LIDOCAINE EX Apply 1 application topically daily as needed (for knee pain).   aspirin 325 MG EC tablet Take 325 mg by mouth daily.   atorvastatin 80 MG tablet Commonly known as: LIPITOR Take 1 tablet (80 mg total) by mouth daily.   cholecalciferol 25 MCG (1000 UNIT) tablet Commonly known as: VITAMIN D3 Take 1,000 Units by mouth daily.   gabapentin 300 MG capsule Commonly known as: NEURONTIN Take 300 mg by mouth 3 (three) times daily.   levETIRAcetam 500 MG tablet Commonly known as: KEPPRA Take 250 mg by mouth 2 (two) times daily. What changed: Another medication with the same name was removed. Continue taking this medication, and follow the directions you see here.   Linzess 72 MCG capsule Generic drug: linaclotide TAKE (1) CAPSULE BY MOUTH DAILY AS NEEDED FOR CONSTIPATION. What changed: See the new instructions.   loratadine 10 MG tablet Commonly known as: CLARITIN Take 10 mg by mouth daily.   meclizine 25 MG tablet Commonly known as: ANTIVERT Take 1 tablet (25 mg total) by mouth 3 (three) times daily as needed for dizziness.   metoprolol tartrate 25 MG tablet Commonly known as: LOPRESSOR Take 1 tablet (25 mg total) by mouth 2 (two) times daily. Take 50 mg in the morning and 50 mg every evening palpitations   Restasis 0.05 % ophthalmic emulsion Generic drug: cycloSPORINE Place 1 drop into both eyes 2 (two) times daily as needed (chronic dry eye).   topiramate 50 MG tablet Commonly known as: TOPAMAX Take 25 mg by mouth 2 (two) times daily. What changed: Another medication with the same name was removed. Continue taking this medication, and follow the directions you see here.   traMADol 50 MG tablet Commonly known as: ULTRAM Take 25 mg by mouth every 8 (eight) hours as needed for moderate pain.        Procedures/Studies: CT HEAD WO CONTRAST (5MM)  Result Date: 01/06/2021 CLINICAL DATA:  TIA.  Weakness. EXAM: CT HEAD WITHOUT  CONTRAST TECHNIQUE: Contiguous axial images were obtained from the base of the skull through the vertex without intravenous contrast. COMPARISON:  CT head 11/02/2020. FINDINGS: Brain: No evidence of acute infarction, hemorrhage, hydrocephalus, extra-axial collection or mass lesion/mass effect. Again seen is mild diffuse atrophy and mild periventricular white matter hypodensity, likely chronic small vessel ischemic  change. Vascular: Atherosclerotic calcifications are present within the cavernous internal carotid arteries. Skull: Normal. Negative for fracture or focal lesion. Sinuses/Orbits: No acute finding. Other: None. IMPRESSION: No acute intracranial abnormality. Stable diffuse atrophy a and mild chronic small vessel ischemic change. Electronically Signed   By: Ronney Asters M.D.   On: 01/06/2021 23:09   MR BRAIN WO CONTRAST  Result Date: 01/07/2021 CLINICAL DATA:  Transient ischemic attack.  Weakness. EXAM: MRI HEAD WITHOUT CONTRAST TECHNIQUE: Multiplanar, multiecho pulse sequences of the brain and surrounding structures were obtained without intravenous contrast. COMPARISON:  Head CT yesterday.  MRI 07/18/2019. FINDINGS: Brain: Diffusion imaging does not show any acute or subacute infarction. Chronic small-vessel ischemic changes affect pons. Minimal small vessel change of the cerebellum. Cerebral hemispheres show advanced chronic small-vessel ischemic changes of the thalami, basal ganglia and cerebral hemispheric white matter. No cortical or large vessel territory infarction. There is generalized brain volume loss, frontal lobe predominant. No mass, acute hemorrhage, hydrocephalus or extra-axial collection. Punctate foci of hemosiderin deposition associated with some of the old small vessel infarctions, particularly in the thalami. Vascular: Major vessels at the base of the brain show flow. Skull and upper cervical spine: Negative Sinuses/Orbits: Clear/normal Other: None IMPRESSION: No acute finding.  Extensive chronic small-vessel ischemic changes throughout the brain, particularly affecting the thalami, basal ganglia and white matter. Brain atrophy/volume loss, frontal lobe predominant. Hemosiderin deposition associated with some of the old small vessel infarctions, particularly in the thalami, but no evidence of acute hemorrhage. Electronically Signed   By: Nelson Chimes M.D.   On: 01/07/2021 11:29   US Carotid Bilateral (at [facility] and [facility] only)  Result Date: 01/07/2021 CLINICAL DATA:  Transient ischemic attack, altered mental status EXAM: BILATERAL CAROTID DUPLEX ULTRASOUND TECHNIQUE: Pearline Cables scale imaging, color Doppler and duplex ultrasound were performed of bilateral carotid and vertebral arteries in the neck. COMPARISON:  None. FINDINGS: Criteria: Quantification of carotid stenosis is based on velocity parameters that correlate the residual internal carotid diameter with NASCET-based stenosis levels, using the diameter of the distal internal carotid lumen as the denominator for stenosis measurement. The following velocity measurements were obtained: RIGHT ICA: 73/14 cm/sec CCA: 45/80 cm/sec SYSTOLIC ICA/CCA RATIO:  0.9 ECA:  82 cm/sec LEFT ICA: 112/24 cm/sec CCA: 998/33 cm/sec SYSTOLIC ICA/CCA RATIO:  1.0 ECA:  77 cm/sec RIGHT CAROTID ARTERY: No significant atherosclerotic plaque or evidence of stenosis in the internal carotid artery. RIGHT VERTEBRAL ARTERY:  Patent with normal antegrade flow. LEFT CAROTID ARTERY: Trace smooth heterogeneous atherosclerotic plaque in the proximal internal carotid artery. By peak systolic velocity criteria, the estimated stenosis is less than 50%. LEFT VERTEBRAL ARTERY:  Patent with normal antegrade flow. IMPRESSION: 1. No significant atherosclerotic plaque or evidence of stenosis in the right internal carotid artery. 2. Mild (1-49%) stenosis proximal left internal carotid artery secondary to trace smooth heterogeneous atherosclerotic plaque. 3. Vertebral arteries are patent  with normal antegrade flow. Signed, Criselda Peaches, MD, [facility] Radiology Electronically Signed   By: Jacqulynn Cadet M.D.   On: 01/07/2021 11:48   DG Chest Port 1 View  Result Date: 01/06/2021 CLINICAL DATA:  Questionable sepsis. EXAM: PORTABLE CHEST 1 VIEW COMPARISON:  Chest x-ray 08/15/2020. FINDINGS: Heart is tortuous, unchanged. The heart is mildly enlarged, unchanged. There is no lung consolidation, pleural effusion or pneumothorax. There is minimal bibasilar atelectasis. No acute fractures are seen. IMPRESSION: 1. No evidence for pneumonia or edema. 2. Stable cardiomegaly. Electronically Signed   By: Warren Lacy  Dagoberto Reef M.D.   On: 01/06/2021 17:59   ECHOCARDIOGRAM COMPLETE  Result Date: 01/07/2021    ECHOCARDIOGRAM REPORT   Patient Name:   [name] Date of Exam: 01/07/2021 Medical Rec #:  [id number]              Height:       64.0 in Accession #:    [id number]             Weight:       169.1 lb Date of Birth:  04/20/1934              BSA:          1.822 m Patient Age:    36 years               BP:           120/62 mmHg Patient Gender: F                      HR:           77 bpm. Exam Location:  [facility] Procedure: 2D Echo, Cardiac Doppler and Color Doppler Indications:    TIA  History:        Patient has prior history of Echocardiogram examinations, most                 recent 03/01/2019. TIA, Arrythmias:PVC; Risk                 Factors:Hypertension and Dyslipidemia.  Sonographer:    Wenda Low Referring Phys: [id number] ASIA B Bigelow  1. Left ventricular ejection fraction, by estimation, is 65 to 70%. The left ventricle has normal function. The left ventricle has no regional wall motion abnormalities. Left ventricular diastolic parameters are consistent with Grade I diastolic dysfunction (impaired relaxation).  2. Right ventricular systolic function is normal. The right ventricular size is normal. There  is moderately elevated pulmonary artery systolic pressure.  3. Right atrial size was mildly dilated.  4. The mitral valve is normal in structure. No evidence of mitral valve regurgitation. No evidence of mitral stenosis.  5. The aortic valve is tricuspid. Aortic valve regurgitation is not visualized. No aortic stenosis is present.  6. The inferior vena cava is normal in size with greater than 50% respiratory variability, suggesting right atrial pressure of 3 mmHg. FINDINGS  Left Ventricle: Left ventricular ejection fraction, by estimation, is 65 to 70%. The left ventricle has normal function. The left ventricle has no regional wall motion abnormalities. The left ventricular internal cavity size was normal in size. There is  no left ventricular hypertrophy. Left ventricular diastolic parameters are consistent with Grade I diastolic dysfunction (impaired relaxation). Normal left ventricular filling pressure. Right Ventricle: The right ventricular size is normal. No increase in right ventricular wall thickness. Right ventricular systolic function is normal. There is moderately elevated pulmonary artery systolic pressure. The tricuspid regurgitant velocity is 3.23 m/s, and with an assumed right atrial pressure of 8 mmHg, the estimated right ventricular systolic pressure is 62.3 mmHg. Left Atrium: Left atrial size was normal in size. Right Atrium: Right atrial size was mildly dilated. Pericardium: There is no evidence of pericardial effusion. Mitral Valve: The mitral valve is normal in structure. No evidence of mitral valve regurgitation. No evidence of mitral valve stenosis. MV peak gradient, 3.6 mmHg. The mean mitral valve gradient is 1.0 mmHg. Tricuspid Valve: The tricuspid valve is normal in structure. Tricuspid valve regurgitation is  mild . No evidence of tricuspid stenosis. Aortic Valve: The aortic valve is tricuspid. Aortic valve regurgitation is not visualized. No aortic stenosis is present. Aortic valve mean  gradient measures 4.0 mmHg. Aortic valve peak gradient measures 7.4 mmHg. Aortic valve area, by VTI measures 2.27 cm. Pulmonic Valve: The pulmonic valve was normal in structure. Pulmonic valve regurgitation is mild. No evidence of pulmonic stenosis. Aorta: The aortic root is normal in size and structure. Venous: The inferior vena cava is normal in size with greater than 50% respiratory variability, suggesting right atrial pressure of 3 mmHg. IAS/Shunts: No atrial level shunt detected by color flow Doppler.  LEFT VENTRICLE PLAX 2D LVIDd:         4.30 cm   Diastology LVIDs:         3.10 cm   LV e' medial:    5.44 cm/s LV PW:         0.70 cm   LV E/e' medial:  10.7 LV IVS:        1.00 cm   LV e' lateral:   6.42 cm/s LVOT diam:     1.80 cm   LV E/e' lateral: 9.1 LV SV:         60 LV SV Index:   33 LVOT Area:     2.54 cm  RIGHT VENTRICLE RV Basal diam:  3.20 cm RV Mid diam:    2.40 cm RV S prime:     12.50 cm/s TAPSE (M-mode): 1.9 cm LEFT ATRIUM             Index        RIGHT ATRIUM           Index LA diam:        2.90 cm 1.59 cm/m   RA Area:     18.60 cm LA Vol (A2C):   40.7 ml 22.34 ml/m  RA Volume:   50.70 ml  27.83 ml/m LA Vol (A4C):   31.8 ml 17.46 ml/m LA Biplane Vol: 35.9 ml 19.71 ml/m  AORTIC VALVE                    PULMONIC VALVE AV Area (Vmax):    2.13 cm     PV Vmax:       0.62 m/s AV Area (Vmean):   2.08 cm     PV Peak grad:  1.5 mmHg AV Area (VTI):     2.27 cm AV Vmax:           136.00 cm/s AV Vmean:          85.800 cm/s AV VTI:            0.264 m AV Peak Grad:      7.4 mmHg AV Mean Grad:      4.0 mmHg LVOT Vmax:         114.00 cm/s LVOT Vmean:        70.300 cm/s LVOT VTI:          0.236 m LVOT/AV VTI ratio: 0.89  AORTA Ao Root diam: 3.10 cm Ao Asc diam:  3.40 cm MITRAL VALVE               TRICUSPID VALVE MV Area (PHT): 2.61 cm    TR Peak grad:   41.7 mmHg MV Area VTI:   2.74 cm    TR Vmax:        323.00 cm/s MV Peak grad:  3.6 mmHg MV Mean grad:  1.0 mmHg    SHUNTS MV Vmax:       0.95 m/s     Systemic VTI:  0.24 m MV Vmean:      53.2 cm/s   Systemic Diam: 1.80 cm MV Decel Time: 291 msec MV E velocity: 58.30 cm/s MV A velocity: 93.00 cm/s MV E/A ratio:  0.63 Carlyle Dolly MD Electronically signed by Carlyle Dolly MD Signature Date/Time: 01/07/2021/12:59:13 PM    Final      Subjective: Pt reports that she is feeling well, no complaints and feels at her baseline.  She wants to go home.    Discharge Exam: Vitals:   01/07/21 1030 01/07/21 1330  BP: 133/63 (!) 104/92  Pulse: 66 80  Resp: 19 19  Temp:    SpO2: (!) 77% 100%   Vitals:   01/07/21 0930 01/07/21 1000 01/07/21 1030 01/07/21 1330  BP: 120/62 123/63 133/63 (!) 104/92  Pulse: 74 80 66 80  Resp: 19 20 19 19   Temp:      TempSrc:      SpO2: 100% 90% (!) 77% 100%  Weight:      Height:       General: Pt is alert, awake, not in acute distress Cardiovascular: RRR, S1/S2 +, no rubs, no gallops Respiratory: CTA bilaterally, no wheezing, no rhonchi Abdominal: Soft, NT, ND, bowel sounds + Extremities: no edema, no cyanosis Neurological: nonfocal exam.     The results of significant diagnostics from this hospitalization (including imaging, microbiology, ancillary and laboratory) are listed below for reference.     Microbiology: Recent Results (from the past 240 hour(s))  Resp Panel by RT-PCR (Flu A&B, Covid) Nasopharyngeal Swab     Status: None   Collection Time: 01/06/21  5:09 PM   Specimen: Nasopharyngeal Swab; Nasopharyngeal(NP) swabs in vial transport medium  Result Value Ref Range Status   SARS Coronavirus 2 by RT PCR NEGATIVE NEGATIVE Final    Comment: (NOTE) SARS-CoV-2 target nucleic acids are NOT DETECTED.  The SARS-CoV-2 RNA is generally detectable in upper respiratory specimens during the acute phase of infection. The lowest concentration of SARS-CoV-2 viral copies this assay can detect is 138 copies/mL. A negative result does not preclude SARS-Cov-2 infection and should not be used as the sole basis  for treatment or other patient management decisions. A negative result may occur with  improper specimen collection/handling, submission of specimen other than nasopharyngeal swab, presence of viral mutation(s) within the areas targeted by this assay, and inadequate number of viral copies(<138 copies/mL). A negative result must be combined with clinical observations, patient history, and epidemiological information. The expected result is Negative.  Fact Sheet for Patients:  EntrepreneurPulse.com.au  Fact Sheet for Healthcare Providers:  IncredibleEmployment.be  This test is no t yet approved or cleared by the [place] FDA and  has been authorized for detection and/or diagnosis of SARS-CoV-2 by FDA under an Emergency Use Authorization (EUA). This EUA will remain  in effect (meaning this test can be used) for the duration of the COVID-19 declaration under Section 564(b)(1) of the Act, 21 U.S.C.section 360bbb-3(b)(1), unless the authorization is terminated  or revoked sooner.       Influenza A by PCR NEGATIVE NEGATIVE Final   Influenza B by PCR NEGATIVE NEGATIVE Final    Comment: (NOTE) The Xpert Xpress SARS-CoV-2/FLU/RSV plus assay is intended as an aid in the diagnosis of influenza from Nasopharyngeal swab specimens and should not be used as a sole basis for treatment. Nasal washings and aspirates are  unacceptable for Xpert Xpress SARS-CoV-2/FLU/RSV testing.  Fact Sheet for Patients: EntrepreneurPulse.com.au  Fact Sheet for Healthcare Providers: IncredibleEmployment.be  This test is not yet approved or cleared by the [place] FDA and has been authorized for detection and/or diagnosis of SARS-CoV-2 by FDA under an Emergency Use Authorization (EUA). This EUA will remain in effect (meaning this test can be used) for the duration of the COVID-19 declaration under Section 564(b)(1) of the Act, 21  U.S.C. section 360bbb-3(b)(1), unless the authorization is terminated or revoked.  Performed at [facility], [address] [place], [place] [address]   Blood Culture (routine x 2)     Status: None (Preliminary result)   Collection Time: 01/06/21  5:12 PM   Specimen: BLOOD RIGHT ARM  Result Value Ref Range Status   Specimen Description BLOOD RIGHT ARM  Final   Special Requests   Final    Blood Culture results may not be optimal due to an inadequate volume of blood received in culture bottles BOTTLES DRAWN AEROBIC AND ANAEROBIC   Culture   Final    NO GROWTH < 24 HOURS Performed at [facility], [address], [place], [place] [address]    Report Status PENDING  Incomplete  Blood Culture (routine x 2)     Status: None (Preliminary result)   Collection Time: 01/06/21  5:15 PM   Specimen: BLOOD LEFT HAND  Result Value Ref Range Status   Specimen Description BLOOD LEFT HAND  Final   Special Requests   Final    Blood Culture results may not be optimal due to an inadequate volume of blood received in culture bottles BOTTLES DRAWN AEROBIC AND ANAEROBIC   Culture   Final    NO GROWTH < 24 HOURS Performed at [facility], [address], [place], [place] [address]    Report Status PENDING  Incomplete     Labs: BNP (last 3 results) No results for input(s): BNP in the last 8760 hours. Basic Metabolic Panel: Recent Labs  Lab 01/06/21 1712  NA 139  K 3.3*  CL 108  CO2 23  GLUCOSE 102*  BUN 14  CREATININE 1.23*  CALCIUM 9.2   Liver Function Tests: Recent Labs  Lab 01/06/21 1712  AST 18  ALT 16  ALKPHOS 95  BILITOT 0.7  PROT 7.9  ALBUMIN 3.8   No results for input(s): LIPASE, AMYLASE in the last 168 hours. No results for input(s): AMMONIA in the last 168 hours. CBC: Recent Labs  Lab 01/06/21 1712  WBC 11.8*  NEUTROABS 9.1*  HGB 13.6  HCT 42.7  MCV 101.2*  PLT 263   Cardiac Enzymes: No results for input(s): CKTOTAL, CKMB, CKMBINDEX, TROPONINI in the last 168  hours. BNP: Invalid input(s): POCBNP CBG: No results for input(s): GLUCAP in the last 168 hours. D-Dimer No results for input(s): DDIMER in the last 72 hours. Hgb A1c Recent Labs    01/06/21 1712  HGBA1C 6.1*   Lipid Profile Recent Labs    01/07/21 0335  CHOL 122  HDL 52  LDLCALC 61  TRIG 43  CHOLHDL 2.3   Thyroid function studies Recent Labs    01/06/21 1712  TSH 0.357   Anemia work up No results for input(s): VITAMINB12, FOLATE, FERRITIN, TIBC, IRON, RETICCTPCT in the last 72 hours. Urinalysis    Component Value Date/Time   COLORURINE STRAW (A) 01/06/2021 1732   APPEARANCEUR CLEAR 01/06/2021 1732   LABSPEC 1.005 01/06/2021 1732   PHURINE 7.0 01/06/2021 1732   GLUCOSEU  NEGATIVE 01/06/2021 1732   HGBUR NEGATIVE 01/06/2021 1732   BILIRUBINUR NEGATIVE 01/06/2021 1732   KETONESUR NEGATIVE 01/06/2021 1732   PROTEINUR NEGATIVE 01/06/2021 1732   UROBILINOGEN 0.2 03/12/2012 0022   NITRITE NEGATIVE 01/06/2021 1732   LEUKOCYTESUR NEGATIVE 01/06/2021 1732   Sepsis Labs Invalid input(s): PROCALCITONIN,  WBC,  LACTICIDVEN Microbiology Recent Results (from the past 240 hour(s))  Resp Panel by RT-PCR (Flu A&B, Covid) Nasopharyngeal Swab     Status: None   Collection Time: 01/06/21  5:09 PM   Specimen: Nasopharyngeal Swab; Nasopharyngeal(NP) swabs in vial transport medium  Result Value Ref Range Status   SARS Coronavirus 2 by RT PCR NEGATIVE NEGATIVE Final    Comment: (NOTE) SARS-CoV-2 target nucleic acids are NOT DETECTED.  The SARS-CoV-2 RNA is generally detectable in upper respiratory specimens during the acute phase of infection. The lowest concentration of SARS-CoV-2 viral copies this assay can detect is 138 copies/mL. A negative result does not preclude SARS-Cov-2 infection and should not be used as the sole basis for treatment or other patient management decisions. A negative result may occur with  improper specimen collection/handling, submission of specimen  other than nasopharyngeal swab, presence of viral mutation(s) within the areas targeted by this assay, and inadequate number of viral copies(<138 copies/mL). A negative result must be combined with clinical observations, patient history, and epidemiological information. The expected result is Negative.  Fact Sheet for Patients:  EntrepreneurPulse.com.au  Fact Sheet for Healthcare Providers:  IncredibleEmployment.be  This test is no t yet approved or cleared by the [place] FDA and  has been authorized for detection and/or diagnosis of SARS-CoV-2 by FDA under an Emergency Use Authorization (EUA). This EUA will remain  in effect (meaning this test can be used) for the duration of the COVID-19 declaration under Section 564(b)(1) of the Act, 21 U.S.C.section 360bbb-3(b)(1), unless the authorization is terminated  or revoked sooner.       Influenza A by PCR NEGATIVE NEGATIVE Final   Influenza B by PCR NEGATIVE NEGATIVE Final    Comment: (NOTE) The Xpert Xpress SARS-CoV-2/FLU/RSV plus assay is intended as an aid in the diagnosis of influenza from Nasopharyngeal swab specimens and should not be used as a sole basis for treatment. Nasal washings and aspirates are unacceptable for Xpert Xpress SARS-CoV-2/FLU/RSV testing.  Fact Sheet for Patients: EntrepreneurPulse.com.au  Fact Sheet for Healthcare Providers: IncredibleEmployment.be  This test is not yet approved or cleared by the [place] FDA and has been authorized for detection and/or diagnosis of SARS-CoV-2 by FDA under an Emergency Use Authorization (EUA). This EUA will remain in effect (meaning this test can be used) for the duration of the COVID-19 declaration under Section 564(b)(1) of the Act, 21 U.S.C. section 360bbb-3(b)(1), unless the authorization is terminated or revoked.  Performed at [facility], [address], [place], [place]  [address]   Blood Culture (routine x 2)     Status: None (Preliminary result)   Collection Time: 01/06/21  5:12 PM   Specimen: BLOOD RIGHT ARM  Result Value Ref Range Status   Specimen Description BLOOD RIGHT ARM  Final   Special Requests   Final    Blood Culture results may not be optimal due to an inadequate volume of blood received in culture bottles BOTTLES DRAWN AEROBIC AND ANAEROBIC   Culture   Final    NO GROWTH < 24 HOURS Performed at [facility], [address] [place], [place] [address]    Report Status PENDING  Incomplete  Blood  Culture (routine x 2)     Status: None (Preliminary result)   Collection Time: 01/06/21  5:15 PM   Specimen: BLOOD LEFT HAND  Result Value Ref Range Status   Specimen Description BLOOD LEFT HAND  Final   Special Requests   Final    Blood Culture results may not be optimal due to an inadequate volume of blood received in culture bottles BOTTLES DRAWN AEROBIC AND ANAEROBIC   Culture   Final    NO GROWTH < 24 HOURS Performed at [facility], [address] [place], [place] [address]    Report Status PENDING  Incomplete   Time coordinating discharge:  35 minutes   SIGNED:  Irwin Brakeman, MD  [facility] 01/07/2021, 3:06 PM How to contact the [facility] Attending or Consulting provider [place] or covering provider during after hours [place], for this patient?  Check the care team in [facility] and look for a) attending/consulting [facility] provider listed and b) the [facility] team listed Log into [email or web address] and use [place]'s universal password to access. If you do not have the password, please contact the hospital operator. Locate the [facility] provider you are looking for under [facility] and page to a number that you can be directly reached. If you still have difficulty reaching the provider, please page the [facility] (Director on Call) for the Hospitalists listed on amion for assistance.

## 2021-01-07 NOTE — Evaluation (Deleted)
Physical Therapy Evaluation Patient Details Name: [name] MRN: [id number] DOB: April 08, 1934 Today's Date: 01/07/2021  History of Present Illness  [name] is an 86 year old female who presents to the ED with a chief complaint of altered mental status.  History is limited, as patient does not fully remember what happened.  Patient reports that she woke up from her nap to her daughter's voice.  She does not remember what her daughter was saying, patient remembers just repeating "what is wrong?  What is wrong?"  Patient reports that she thought she had been fine, but her daughter told her she had not taken her morning medications.  Patient reports it is normal for her to take a nap during the day, and her daughter usually calls her and wakes her up.  Daughter was very concerned because today when she called her to wake her up, patient did not wake up and answer the phone.  Patient reports that she did not have breakfast this morning, which is abnormal for her as well.  She reports having normal appetite, and eating dinner last night.  Patient denies any fever chills, dysuria, cough, skin rashes.  Patient does have a history of complex partial seizure per neurology note in December 2020, the patient does not recall this.  She does not recall any aura, or any seizure activity from then or from today.  Patient reports when she did wake up she was able to ambulate to the bathroom with her cane-which is how she normally ambulates, however daughter reported the patient could not get up.  She does not think there was any weakness on one side more than the other.  On presentation to the ED, patient was answering questions appropriately but did have a fever.   Clinical Impression  Patient in bed awake, alert, and agreeable for therapy. Patient functioning at baseline for functional mobility and gait other than demonstrating slightly decreased gait speed using straight cane in hallway. Patient  demonstrates steadiness during ambulation using a cane, no loss of balance. Patient demonstrates bed mobility w/ modified independence d/t increased time. Patient discharged to care of nursing for ambulation daily as tolerated for length of stay.       Recommendations for follow up therapy are one component of a multi-disciplinary discharge planning process, led by the attending physician.  Recommendations may be updated based on patient status, additional functional criteria and insurance authorization.  Follow Up Recommendations No PT follow up    Assistance Recommended at Discharge None  Functional Status Assessment Patient has not had a recent decline in their functional status  Equipment Recommendations  None recommended by PT    Recommendations for Other Services       Precautions / Restrictions Precautions Precautions: Fall Restrictions Weight Bearing Restrictions: No      Mobility  Bed Mobility Overal bed mobility: Modified Independent             General bed mobility comments: mild labored movement, increased time    Transfers Overall transfer level: Modified independent Equipment used: Straight cane               General transfer comment: Pt able to ambualte in hall; transfer to chair, and retrun to bed with Mod I level of assist using cane.    Ambulation/Gait Ambulation/Gait assistance: Modified independent (Device/Increase time) Gait Distance (Feet): 120 Feet Assistive device: Straight cane Gait Pattern/deviations: WFL(Within Functional Limits) Gait velocity: slighty slower than normal movement, near baseline  Stairs            Wheelchair Mobility    Modified Rankin (Stroke Patients Only)       Balance Overall balance assessment: Modified Independent                                           Pertinent Vitals/Pain Pain Assessment: No/denies pain    Home Living Family/patient expects to be discharged to::  Private residence Living Arrangements: Alone Available Help at Discharge: Family;Available PRN/intermittently Type of Home: House Home Access: Stairs to enter Entrance Stairs-Rails: Right;Left;Can reach both Entrance Stairs-Number of Steps: 1   Home Layout: One level Home Equipment: Conservation officer, nature (2 wheels);Cane - single point;BSC;Grab bars - tub/shower;Shower seat Additional Comments: Pt reports daughter is availalbe PRN; daughter works.    Prior Function Prior Level of Function : Needs assist             Mobility Comments: Indepenent household ambulation with cane at most times. Pt does go to church using cane as well. ADLs Comments: Pt reports independence with ADL's. Daugter assits with IADL's of grocery shopping and cleaning.     Hand Dominance   Dominant Hand: Right    Extremity/Trunk Assessment   Upper Extremity Assessment Upper Extremity Assessment: Defer to OT evaluation    Lower Extremity Assessment Lower Extremity Assessment: Overall WFL for tasks assessed    Cervical / Trunk Assessment Cervical / Trunk Assessment: Normal  Communication   Communication: No difficulties  Cognition Arousal/Alertness: Awake/alert Behavior During Therapy: WFL for tasks assessed/performed Overall Cognitive Status: Within Functional Limits for tasks assessed                                          General Comments      Exercises     Assessment/Plan    PT Assessment Patient does not need any further PT services  PT Problem List         PT Treatment Interventions      PT Goals (Current goals can be found in the Care Plan section)  Acute Rehab PT Goals Patient Stated Goal: Return home PT Goal Formulation: With patient Time For Goal Achievement: 01/07/21 Potential to Achieve Goals: Good    Frequency     Barriers to discharge        Co-evaluation PT/OT/SLP Co-Evaluation/Treatment: Yes Reason for Co-Treatment: To address functional/ADL  transfers PT goals addressed during session: Mobility/safety with mobility OT goals addressed during session: ADL's and self-care       AM-PAC PT "6 Clicks" Mobility  Outcome Measure Help needed turning from your back to your side while in a flat bed without using bedrails?: None Help needed moving from lying on your back to sitting on the side of a flat bed without using bedrails?: None Help needed moving to and from a bed to a chair (including a wheelchair)?: None Help needed standing up from a chair using your arms (e.g., wheelchair or bedside chair)?: None Help needed to walk in hospital room?: None Help needed climbing 3-5 steps with a railing? : None 6 Click Score: 24    End of Session Equipment Utilized During Treatment: Gait belt Activity Tolerance: Patient tolerated treatment well Patient left: in bed;with call bell/phone within reach Nurse Communication: Mobility status PT  Visit Diagnosis: Unsteadiness on feet (R26.81);Other abnormalities of gait and mobility (R26.89);Muscle weakness (generalized) (M62.81)    Time:  -      Charges:              John Giovanni, SPT

## 2021-01-07 NOTE — Evaluation (Signed)
Physical Therapy Evaluation Patient Details Name: [name] MRN: [id number] DOB: 1934/10/05 Today's Date: 01/07/2021  History of Present Illness  [name] is an 86 year old female who presents to the ED with a chief complaint of altered mental status.  History is limited, as patient does not fully remember what happened.  Patient reports that she woke up from her nap to her daughter's voice.  She does not remember what her daughter was saying, patient remembers just repeating "what is wrong?  What is wrong?"  Patient reports that she thought she had been fine, but her daughter told her she had not taken her morning medications.  Patient reports it is normal for her to take a nap during the day, and her daughter usually calls her and wakes her up.  Daughter was very concerned because today when she called her to wake her up, patient did not wake up and answer the phone.  Patient reports that she did not have breakfast this morning, which is abnormal for her as well.  She reports having normal appetite, and eating dinner last night.  Patient denies any fever chills, dysuria, cough, skin rashes.  Patient does have a history of complex partial seizure per neurology note in December 2020, the patient does not recall this.  She does not recall any aura, or any seizure activity from then or from today.  Patient reports when she did wake up she was able to ambulate to the bathroom with her cane-which is how she normally ambulates, however daughter reported the patient could not get up.  She does not think there was any weakness on one side more than the other.  On presentation to the ED, patient was answering questions appropriately but did have a fever.   Clinical Impression  Patient in bed awake, alert, and agreeable for therapy. Patient functioning at baseline for functional mobility and gait other than demonstrating slightly decreased gait speed using straight cane in hallway. Patient  demonstrates steadiness during ambulation using a cane, no loss of balance. Patient demonstrates bed mobility w/ modified independence d/t increased time. Patient discharged to care of nursing for ambulation daily as tolerated for length of stay.        Recommendations for follow up therapy are one component of a multi-disciplinary discharge planning process, led by the attending physician.  Recommendations may be updated based on patient status, additional functional criteria and insurance authorization.  Follow Up Recommendations No PT follow up    Assistance Recommended at Discharge None  Functional Status Assessment Patient has not had a recent decline in their functional status  Equipment Recommendations  None recommended by PT    Recommendations for Other Services       Precautions / Restrictions Precautions Precautions: Fall      Mobility  Bed Mobility Overal bed mobility: Modified Independent             General bed mobility comments: mild labored movement, increased time    Transfers Overall transfer level: Modified independent Equipment used: Straight cane               General transfer comment: Pt able to ambualte in hall; transfer to chair, and retrun to bed with Mod I level of assist using cane.    Ambulation/Gait Ambulation/Gait assistance: Modified independent (Device/Increase time)   Assistive device: Straight cane Gait Pattern/deviations: WFL(Within Functional Limits) Gait velocity: slighty slower than normal movement, near baseline      Stairs  Wheelchair Mobility    Modified Rankin (Stroke Patients Only)       Balance Overall balance assessment: Modified Independent                                           Pertinent Vitals/Pain      Home Living Family/patient expects to be discharged to:: Private residence Living Arrangements: Alone Available Help at Discharge: Family;Available  PRN/intermittently Type of Home: House Home Access: Stairs to enter Entrance Stairs-Rails: Right;Left;Can reach both Entrance Stairs-Number of Steps: 1   Home Layout: One level Home Equipment: Conservation officer, nature (2 wheels);Cane - single point;BSC;Grab bars - tub/shower;Shower seat Additional Comments: Pt reports daughter is availalbe PRN; daughter works.    Prior Function Prior Level of Function : Needs assist             Mobility Comments: Indepenent household ambulation with cane at most times. Pt does go to church using cane as well. ADLs Comments: Pt reports independence with ADL's. Daugter assits with IADL's of grocery shopping and cleaning.     Hand Dominance   Dominant Hand: Right    Extremity/Trunk Assessment             Cervical / Trunk Assessment Cervical / Trunk Assessment: Normal  Communication   Communication: No difficulties  Cognition Arousal/Alertness: Awake/alert Behavior During Therapy: WFL for tasks assessed/performed Overall Cognitive Status: Within Functional Limits for tasks assessed                                          General Comments      Exercises     Assessment/Plan    PT Assessment Patient does not need any further PT services  PT Problem List         PT Treatment Interventions      PT Goals (Current goals can be found in the Care Plan section)  Acute Rehab PT Goals Patient Stated Goal: Return home PT Goal Formulation: With patient Time For Goal Achievement: 01/07/21 Potential to Achieve Goals: Good    Frequency     Barriers to discharge        Co-evaluation PT/OT/SLP Co-Evaluation/Treatment: Yes             AM-PAC PT "6 Clicks" Mobility  Outcome Measure Help needed turning from your back to your side while in a flat bed without using bedrails?: None Help needed moving from lying on your back to sitting on the side of a flat bed without using bedrails?: None Help needed moving to and from a  bed to a chair (including a wheelchair)?: None Help needed standing up from a chair using your arms (e.g., wheelchair or bedside chair)?: None Help needed to walk in hospital room?: None Help needed climbing 3-5 steps with a railing? : None 6 Click Score: 24    End of Session Equipment Utilized During Treatment: Gait belt Activity Tolerance: Patient tolerated treatment well Patient left: in bed;with call bell/phone within reach Nurse Communication: Mobility status PT Visit Diagnosis: Unsteadiness on feet (R26.81);Other abnormalities of gait and mobility (R26.89);Muscle weakness (generalized) (M62.81)    Time: 1962-2297 PT Time Calculation (min) (ACUTE ONLY): 23 min   Charges:   PT Evaluation $PT Eval Moderate Complexity: 1 Mod PT Treatments $Therapeutic Activity: 23-37 mins  John Giovanni, SPT  During this treatment session, the therapist was present, participating in and directing the treatment.  3:14 PM, 01/07/21 Lonell Grandchild, MPT Physical Therapist with [facility] 336 [phone] office [phone] mobile phone

## 2021-01-08 DIAGNOSIS — E876 Hypokalemia: Secondary | ICD-10-CM | POA: Diagnosis present

## 2021-01-08 DIAGNOSIS — G9341 Metabolic encephalopathy: Secondary | ICD-10-CM | POA: Diagnosis present

## 2021-01-11 LAB — CULTURE, BLOOD (ROUTINE X 2)
Culture: NO GROWTH
Culture: NO GROWTH

## 2021-01-12 DIAGNOSIS — G890 Central pain syndrome: Secondary | ICD-10-CM | POA: Diagnosis not present

## 2021-01-12 DIAGNOSIS — M25571 Pain in right ankle and joints of right foot: Secondary | ICD-10-CM | POA: Diagnosis not present

## 2021-01-12 DIAGNOSIS — Z79891 Long term (current) use of opiate analgesic: Secondary | ICD-10-CM | POA: Diagnosis not present

## 2021-01-12 DIAGNOSIS — G4452 New daily persistent headache (NDPH): Secondary | ICD-10-CM | POA: Diagnosis not present

## 2021-01-12 DIAGNOSIS — I679 Cerebrovascular disease, unspecified: Secondary | ICD-10-CM | POA: Diagnosis not present

## 2021-01-12 DIAGNOSIS — I6930 Unspecified sequelae of cerebral infarction: Secondary | ICD-10-CM | POA: Diagnosis not present

## 2021-01-12 DIAGNOSIS — I10 Essential (primary) hypertension: Secondary | ICD-10-CM | POA: Diagnosis not present

## 2021-01-13 ENCOUNTER — Encounter: Payer: Self-pay | Admitting: Family Medicine

## 2021-01-13 ENCOUNTER — Ambulatory Visit (INDEPENDENT_AMBULATORY_CARE_PROVIDER_SITE_OTHER): Payer: Medicare Other | Admitting: Family Medicine

## 2021-01-13 ENCOUNTER — Other Ambulatory Visit: Payer: Self-pay

## 2021-01-13 VITALS — BP 120/80 | HR 69 | Resp 18 | Ht 63.0 in | Wt 161.1 lb

## 2021-01-13 DIAGNOSIS — Z7689 Persons encountering health services in other specified circumstances: Secondary | ICD-10-CM | POA: Diagnosis not present

## 2021-01-13 DIAGNOSIS — Z23 Encounter for immunization: Secondary | ICD-10-CM | POA: Diagnosis not present

## 2021-01-13 DIAGNOSIS — I10 Essential (primary) hypertension: Secondary | ICD-10-CM | POA: Diagnosis not present

## 2021-01-13 NOTE — Patient Instructions (Signed)
F/U with Dr Posey Pronto as before, call if you need to be seen sooner  Pneumonia 20 today  Please get your Covid vaccine at the pharmacy  Please get shingrix vaccines and TdAP at your pharmacy after the Covid vacccine as able  You are PREDIABETIC, you will be given information about this and referred to nutrition for counselling 9 nurse please refer)  CBC, chem 7 and EGFR today please   Be careful not to fall  Thanks for choosing [facility], we consider it a privelige to serve you.

## 2021-01-14 LAB — CBC
Hematocrit: 39.6 % (ref 34.0–46.6)
Hemoglobin: 13 g/dL (ref 11.1–15.9)
MCH: 31.4 pg (ref 26.6–33.0)
MCHC: 32.8 g/dL (ref 31.5–35.7)
MCV: 96 fL (ref 79–97)
Platelets: 314 10*3/uL (ref 150–450)
RBC: 4.14 x10E6/uL (ref 3.77–5.28)
RDW: 14.3 % (ref 11.7–15.4)
WBC: 6.1 10*3/uL (ref 3.4–10.8)

## 2021-01-14 LAB — BMP8+EGFR
BUN/Creatinine Ratio: 12 (ref 12–28)
BUN: 16 mg/dL (ref 8–27)
CO2: 22 mmol/L (ref 20–29)
Calcium: 9.5 mg/dL (ref 8.7–10.3)
Chloride: 109 mmol/L — ABNORMAL HIGH (ref 96–106)
Creatinine, Ser: 1.33 mg/dL — ABNORMAL HIGH (ref 0.57–1.00)
Glucose: 91 mg/dL (ref 70–99)
Potassium: 4.3 mmol/L (ref 3.5–5.2)
Sodium: 146 mmol/L — ABNORMAL HIGH (ref 134–144)
eGFR: 39 mL/min/{1.73_m2} — ABNORMAL LOW (ref >59)

## 2021-01-18 ENCOUNTER — Encounter: Payer: Self-pay | Admitting: Family Medicine

## 2021-01-18 DIAGNOSIS — Z7689 Persons encountering health services in other specified circumstances: Secondary | ICD-10-CM | POA: Insufficient documentation

## 2021-01-18 NOTE — Progress Notes (Signed)
   [name]     MRN: [id number]      DOB: 1934/05/21   HPI [name] is here for follow up of recent hospitalization from 10/26 to 10/27, hospitalized for AMS,  CVA ruled out and being checked for seizures, she has already had outpatient f/u with Neurology  She has remained stable since going home, no new or additional neurologic symptoms Hospital course is reviewed with patient and her daughter and all questions answered ROS Denies recent fever or chills. Denies sinus pressure, nasal congestion, ear pain or sore throat. Denies chest congestion, productive cough or wheezing. Denies chest pains, palpitations and leg swelling Denies abdominal pain, nausea, vomiting,diarrhea or constipation.   Denies dysuria, frequency, hesitancy or incontinence. Chronic  limitation in mobility. Denies headaches, seizures, numbness, or tingling. Denies depression, anxiety or insomnia. Denies skin break down or rash.   PE  BP 120/80   Pulse 69   Resp 18   Ht 5\' 3"  (1.6 m)   Wt 161 lb 1.3 oz (73.1 kg)   SpO2 91%   BMI 28.53 kg/m   Patient alert and oriented and in no cardiopulmonary distress.  HEENT: No facial asymmetry, EOMI,     Neck adequate ROM .  Chest: Clear to auscultation bilaterally.  CVS: S1, S2 no murmurs, no S3.Regular rate.  ABD: Soft non tender.   Ext: No edema  MS: decreased  ROM spine, shoulders, hips and knees.  Skin: Intact, no ulcerations or rash noted.  Psych: Good eye contact, normal affect. Memory intact not anxious or depressed appearing.  CNS: CN 2-12 intact, power,  normal throughout.no focal deficits noted.   Assessment & Plan  Encounter for support and coordination of transition of care Patient in for follow up of recent hospitalization, and coordination of care Discharge summary, and laboratory and radiology data are reviewed, and any questions or concerns  are discussed. Specific issues requiring follow up are specifically  addressed.

## 2021-01-18 NOTE — Assessment & Plan Note (Signed)
Patient in for follow up of recent hospitalization, and coordination of care Discharge summary, and laboratory and radiology data are reviewed, and any questions or concerns  are discussed. Specific issues requiring follow up are specifically addressed.

## 2021-02-05 ENCOUNTER — Telehealth: Payer: Self-pay

## 2021-02-05 NOTE — Telephone Encounter (Signed)
Message sent to [name] in billing:  [name] was charged a AWV on 12/15/20 [facility] did not pay any of the visit.  Does this need to be refiled.  Not sure what is wrong with the claim.  Can you look at this?  Thanks

## 2021-02-08 ENCOUNTER — Inpatient Hospital Stay (HOSPITAL_COMMUNITY): Payer: Medicare Other | Attending: Hematology

## 2021-02-08 DIAGNOSIS — I2699 Other pulmonary embolism without acute cor pulmonale: Secondary | ICD-10-CM

## 2021-02-08 DIAGNOSIS — Z85038 Personal history of other malignant neoplasm of large intestine: Secondary | ICD-10-CM | POA: Insufficient documentation

## 2021-02-08 DIAGNOSIS — Z7982 Long term (current) use of aspirin: Secondary | ICD-10-CM | POA: Diagnosis not present

## 2021-02-08 DIAGNOSIS — Z86711 Personal history of pulmonary embolism: Secondary | ICD-10-CM | POA: Insufficient documentation

## 2021-02-08 DIAGNOSIS — C188 Malignant neoplasm of overlapping sites of colon: Secondary | ICD-10-CM

## 2021-02-08 LAB — COMPREHENSIVE METABOLIC PANEL
ALT: 19 U/L (ref 0–44)
AST: 20 U/L (ref 15–41)
Albumin: 4 g/dL (ref 3.5–5.0)
Alkaline Phosphatase: 92 U/L (ref 38–126)
Anion gap: 8 (ref 5–15)
BUN: 15 mg/dL (ref 8–23)
CO2: 23 mmol/L (ref 22–32)
Calcium: 9.3 mg/dL (ref 8.9–10.3)
Chloride: 109 mmol/L (ref 98–111)
Creatinine, Ser: 1.23 mg/dL — ABNORMAL HIGH (ref 0.44–1.00)
GFR, Estimated: 43 mL/min — ABNORMAL LOW (ref >60)
Glucose, Bld: 93 mg/dL (ref 70–99)
Potassium: 3.5 mmol/L (ref 3.5–5.1)
Sodium: 140 mmol/L (ref 135–145)
Total Bilirubin: 0.8 mg/dL (ref 0.3–1.2)
Total Protein: 7.4 g/dL (ref 6.5–8.1)

## 2021-02-08 LAB — CBC WITH DIFFERENTIAL/PLATELET
Abs Immature Granulocytes: 0.02 10*3/uL (ref 0.00–0.07)
Basophils Absolute: 0 10*3/uL (ref 0.0–0.1)
Basophils Relative: 1 %
Eosinophils Absolute: 0.3 10*3/uL (ref 0.0–0.5)
Eosinophils Relative: 7 %
HCT: 42.1 % (ref 36.0–46.0)
Hemoglobin: 13.5 g/dL (ref 12.0–15.0)
Immature Granulocytes: 0 %
Lymphocytes Relative: 34 %
Lymphs Abs: 1.7 10*3/uL (ref 0.7–4.0)
MCH: 31.8 pg (ref 26.0–34.0)
MCHC: 32.1 g/dL (ref 30.0–36.0)
MCV: 99.3 fL (ref 80.0–100.0)
Monocytes Absolute: 0.4 10*3/uL (ref 0.1–1.0)
Monocytes Relative: 9 %
Neutro Abs: 2.4 10*3/uL (ref 1.7–7.7)
Neutrophils Relative %: 49 %
Platelets: 258 10*3/uL (ref 150–400)
RBC: 4.24 MIL/uL (ref 3.87–5.11)
RDW: 15 % (ref 11.5–15.5)
WBC: 4.9 10*3/uL (ref 4.0–10.5)
nRBC: 0 % (ref 0.0–0.2)

## 2021-02-08 LAB — D-DIMER, QUANTITATIVE: D-Dimer, Quant: 3.56 ug/mL-FEU — ABNORMAL HIGH (ref 0.00–0.50)

## 2021-02-08 LAB — LACTATE DEHYDROGENASE: LDH: 159 U/L (ref 98–192)

## 2021-02-14 NOTE — Progress Notes (Signed)
[place], [place] [address]   CLINIC:  Medical Oncology/Hematology  PCP:  Lindell Spar, MD [address] / [place] [address]  [phone]  REASON FOR VISIT:  Follow-up for PE and colon cancer  PRIOR THERAPY:  1. Right colon segmental resection on 07/31/2009. 2. Eliquis from 04/2018 to 11/25/2018.  CURRENT THERAPY: Aspirin 325 mg QD  INTERVAL HISTORY:  [name], a 86 y.o. female, returns for routine follow-up for her PE and colon cancer. Shreshta was last seen on 08/11/2020.  Today she reports feeling good. She denies SOB and chest pain upon inspiration. She reports occasional ankle swellings but denies leg swellings. She spends most her day at home, and she is able to walk and do her typical home activities unassisted. She reports intermittent tingling in her left hand left and weakness in her left arm which last for periods of 1 hour.  REVIEW OF SYSTEMS:  Review of Systems  Constitutional:  Negative for appetite change and fatigue (75%).  Respiratory:  Negative for shortness of breath.   Cardiovascular:  Positive for leg swelling (ankles) and palpitations. Negative for chest pain.  Gastrointestinal:  Positive for constipation.  Neurological:  Positive for headaches and numbness (hands).  All other systems reviewed and are negative.  PAST MEDICAL/SURGICAL HISTORY:  Past Medical History:  Diagnosis Date   Allergy    Arthritis    Colon cancer ([place])    colon ca dx 07/30/09   History of cardiac monitoring 07/2017   "Event monitor demonstrated sinus rhythm with isolated PACs and no arrhythmias"   History of colon cancer 06/2009   found at time of TCS 06/29/09, 1.2cm sessile cecal polyp, no adjuvent therapy needed   HTN (hypertension)    Hx of cardiovascular stress test 07/2017   "No diagnostic ST segment changes to indicate ischemia. Small, moderate intensity, reversible apical to basal inferolateral defect  consistent with ischemia. This is a low risk study. Nuclear stress EF: 84%."   Hyperlipidemia    Hypothyroidism    PE (pulmonary thromboembolism) ([place])    Renal disorder    cyst on kidney    Silent micro-hemorrhage of brain ([place]) 11/25/2018   Stroke [facility])    TIA (transient ischemic attack) 11/25/2018   Vertigo    Past Surgical History:  Procedure Laterality Date   ABDOMINAL HYSTERECTOMY     COLON SURGERY  07/2009   right hemicolectomy, no residual colon cancer on path   COLONOSCOPY  07/16/2010   [id number] POLYP-TCS 3 YEARS   COLONOSCOPY N/A 08/02/2013   hyperplastic polyps, surveillance in 2020 if benefits outweight the risks   COLONOSCOPY  06/2009   1.2 cm sessile cecal polyp which had adenocarcinoma arising in a tubular adenoma.   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   Occupation: retired from [employer]  Tobacco Use   Smoking status: Never   Smokeless tobacco: Never  Vaping Use   Vaping Use: Never used  Substance and Sexual Activity   Alcohol use: No    Alcohol/week: 0.0 standard drinks  Drug use: No   Sexual activity: Not Currently  Other Topics Concern   Not on file  Social History Narrative   Not on file   Social Determinants of Health   Financial Resource Strain: Low Risk    Difficulty of Paying Living Expenses: Not very hard  Food Insecurity: No Food Insecurity   Worried About Running Out of Food in the Last Year: Never true   Ran Out of Food in the Last Year: Never true  Transportation Needs: No Transportation Needs   Lack of Transportation (Medical): No   Lack of Transportation (Non-Medical): No  Physical Activity: Inactive   Days of  Exercise per Week: 0 days   Minutes of Exercise per Session: 0 min  Stress: No Stress Concern Present   Feeling of Stress : Only a little  Social Connections: Moderately Isolated   Frequency of Communication with Friends and Family: More than three times a week   Frequency of Social Gatherings with Friends and Family: More than three times a week   Attends Religious Services: More than 4 times per year   Active Member of [employer] or Organizations: No   Attends Archivist Meetings: Never   Marital Status: Widowed  Human resources officer Violence: Not At Risk   Fear of Current or Ex-Partner: No   Emotionally Abused: No   Physically Abused: No   Sexually Abused: No    FAMILY HISTORY:  Family History  Problem Relation Age of Onset   Colon cancer Mother        >age60   Arthritis Mother    Cancer Mother    Heart disease Mother    Hyperlipidemia Mother    Hypertension Mother    Heart attack Father    Heart disease Father    Diabetes Maternal Aunt    Hyperlipidemia Daughter    Hypertension Daughter    Liver disease Neg Hx     CURRENT MEDICATIONS:  Current Outpatient Medications  Medication Sig Dispense Refill   acetaminophen (TYLENOL) 500 MG tablet Take 1,000 mg by mouth every 8 (eight) hours as needed for mild pain or headache.     amLODipine (NORVASC) 5 MG tablet Take 1 tablet (5 mg total) by mouth daily. 90 tablet 3   ascorbic acid (VITAMIN C) 250 MG CHEW Chew 250 mg by mouth daily.     ASPERCREME LIDOCAINE EX Apply 1 application topically daily as needed (for knee pain).      aspirin 325 MG EC tablet Take 325 mg by mouth daily.     atorvastatin (LIPITOR) 80 MG tablet Take 1 tablet (80 mg total) by mouth daily. 90 tablet 3   cholecalciferol (VITAMIN D3) 25 MCG (1000 UT) tablet Take 1,000 Units by mouth daily.      gabapentin (NEURONTIN) 300 MG capsule Take 300 mg by mouth 3 (three) times daily.     levETIRAcetam (KEPPRA) 500 MG tablet Take 250 mg by mouth 2 (two) times  daily.     LINZESS 72 MCG capsule TAKE (1) CAPSULE BY MOUTH DAILY AS NEEDED FOR CONSTIPATION. (Patient taking differently: Take 72 mcg by mouth daily as needed (constipation).) 90 capsule 3   loratadine (CLARITIN) 10 MG tablet Take 10 mg by mouth daily.      meclizine (ANTIVERT) 25 MG tablet Take 1 tablet (25 mg total) by mouth 3 (three) times daily as needed for dizziness. 15 tablet 0   metoprolol tartrate (LOPRESSOR) 50 MG tablet Take 1.5 tablets (75 mg total) by mouth  2 (two) times daily. 270 tablet 3   RESTASIS 0.05 % ophthalmic emulsion Place 1 drop into both eyes 2 (two) times daily as needed (chronic dry eye).     topiramate (TOPAMAX) 50 MG tablet Topamax 50 mg tablet  Take 1 tablet twice a day by oral route.     traMADol (ULTRAM) 50 MG tablet Take 25 mg by mouth every 8 (eight) hours as needed for moderate pain.     No current facility-administered medications for this visit.    ALLERGIES:  Allergies  Allergen Reactions   Tape Rash    Zio monitor adhesive causes ulcerated and infected skin .Had to see derm    PHYSICAL EXAM:  Performance status (ECOG): 2 - Symptomatic, <50% confined to bed  Vitals:   02/15/21 1546  BP: (!) 158/76  Pulse: 63  Resp: 18  Temp: (!) 96.7 F (35.9 C)  SpO2: 100%   Wt Readings from Last 3 Encounters:  02/15/21 161 lb (73 kg)  02/15/21 159 lb 12.8 oz (72.5 kg)  01/13/21 161 lb 1.3 oz (73.1 kg)   Physical Exam Vitals reviewed.  Constitutional:      Appearance: Normal appearance.     Comments: In wheelchair  Cardiovascular:     Rate and Rhythm: Normal rate and regular rhythm.     Pulses: Normal pulses.     Heart sounds: Normal heart sounds.  Pulmonary:     Effort: Pulmonary effort is normal.     Breath sounds: Normal breath sounds.  Musculoskeletal:     Right lower leg: 1+ Edema present.     Left lower leg: 1+ Edema present.  Neurological:     General: No focal deficit present.     Mental Status: She is alert and oriented to person,  place, and time.  Psychiatric:        Mood and Affect: Mood normal.        Behavior: Behavior normal.    LABORATORY DATA:  I have reviewed the labs as listed.  CBC Latest Ref Rng & Units 02/08/2021 01/13/2021 01/06/2021  WBC 4.0 - 10.5 K/uL 4.9 6.1 11.8(H)  Hemoglobin 12.0 - 15.0 g/dL 13.5 13.0 13.6  Hematocrit 36.0 - 46.0 % 42.1 39.6 42.7  Platelets 150 - 400 K/uL 258 314 263   CMP Latest Ref Rng & Units 02/08/2021 01/13/2021 01/06/2021  Glucose 70 - 99 mg/dL 93 91 102(H)  BUN 8 - 23 mg/dL 15 16 14   Creatinine 0.44 - 1.00 mg/dL 1.23(H) 1.33(H) 1.23(H)  Sodium 135 - 145 mmol/L 140 146(H) 139  Potassium 3.5 - 5.1 mmol/L 3.5 4.3 3.3(L)  Chloride 98 - 111 mmol/L 109 109(H) 108  CO2 22 - 32 mmol/L 23 22 23   Calcium 8.9 - 10.3 mg/dL 9.3 9.5 9.2  Total Protein 6.5 - 8.1 g/dL 7.4 - 7.9  Total Bilirubin 0.3 - 1.2 mg/dL 0.8 - 0.7  Alkaline Phos 38 - 126 U/L 92 - 95  AST 15 - 41 U/L 20 - 18  ALT 0 - 44 U/L 19 - 16      Component Value Date/Time   RBC 4.24 02/08/2021 1107   MCV 99.3 02/08/2021 1107   MCV 96 01/13/2021 1017   MCV 93.8 04/16/2015 0812   MCH 31.8 02/08/2021 1107   MCHC 32.1 02/08/2021 1107   RDW 15.0 02/08/2021 1107   RDW 14.3 01/13/2021 1017   RDW 15.0 (H) 04/16/2015 0812   LYMPHSABS 1.7 02/08/2021 1107   LYMPHSABS 1.9 04/16/2015 6073  MONOABS 0.4 02/08/2021 1107   MONOABS 0.4 04/16/2015 0812   EOSABS 0.3 02/08/2021 1107   EOSABS 0.2 04/16/2015 0812   BASOSABS 0.0 02/08/2021 1107   BASOSABS 0.1 04/16/2015 1941    DIAGNOSTIC IMAGING:  I have independently reviewed the scans and discussed with the patient. No results found.   ASSESSMENT:  1.  Unprovoked pulmonary embolism: -CT PE protocol on 04/26/2018 showed bilateral pulmonary emboli. -She was treated with Eliquis until her hospitalization on 11/25/2018 when she was diagnosed with a 4 mm frontal hemorrhage. -Eliquis was discontinued.  She started taking aspirin 325 mg in December 2020. -She had multiple  lower extremity Dopplers which were negative for DVT.   2.  Colon cancer: -Cecal polypectomy on 06/29/2009 with adenocarcinoma arising in a tubular adenoma. -Right colon segmental resection with 0/25 lymph nodes positive and no residual cancer on 07/31/2009. -Last colonoscopy on 08/02/2013 showed hyperplastic polyp in the rectum and sigmoid colon. -CT of the abdomen and pelvis on 05/28/2018 did not show any evidence of malignancy.  She did not have any significant weight loss.   PLAN:  1.  Unprovoked pulmonary embolism: - She does not report any pleuritic chest pains or cramps or shortness of breath.  No leg swellings.  Stable ankle edema at 1+. - Reviewed labs with her.  LFTs were normal.  Slight CKD with creatinine 1.23 stable.  CBC was normal. - D-dimer was elevated at 3.56 and stable. - Continue full dose aspirin at this time.  RTC 1 year for follow-up.  Orders placed this encounter:  No orders of the defined types were placed in this encounter.    Derek Jack, MD [place] [phone]   I, Thana Ates, am acting as a scribe for Dr. Derek Jack.  I, Derek Jack MD, have reviewed the above documentation for accuracy and completeness, and I agree with the above.

## 2021-02-15 ENCOUNTER — Ambulatory Visit: Payer: Medicare Other | Admitting: Cardiology

## 2021-02-15 ENCOUNTER — Other Ambulatory Visit: Payer: Self-pay

## 2021-02-15 ENCOUNTER — Ambulatory Visit (HOSPITAL_COMMUNITY): Payer: Medicare Other | Admitting: Hematology

## 2021-02-15 ENCOUNTER — Encounter: Payer: Self-pay | Admitting: Cardiology

## 2021-02-15 ENCOUNTER — Inpatient Hospital Stay (HOSPITAL_COMMUNITY): Payer: Medicare Other | Attending: Hematology | Admitting: Hematology

## 2021-02-15 VITALS — BP 158/76 | HR 63 | Temp 96.7°F | Resp 18 | Ht 64.0 in | Wt 161.0 lb

## 2021-02-15 VITALS — BP 128/78 | HR 70 | Ht 64.0 in | Wt 159.8 lb

## 2021-02-15 DIAGNOSIS — I2699 Other pulmonary embolism without acute cor pulmonale: Secondary | ICD-10-CM

## 2021-02-15 DIAGNOSIS — Z86711 Personal history of pulmonary embolism: Secondary | ICD-10-CM | POA: Diagnosis present

## 2021-02-15 DIAGNOSIS — Z9049 Acquired absence of other specified parts of digestive tract: Secondary | ICD-10-CM | POA: Diagnosis not present

## 2021-02-15 DIAGNOSIS — C188 Malignant neoplasm of overlapping sites of colon: Secondary | ICD-10-CM

## 2021-02-15 DIAGNOSIS — R002 Palpitations: Secondary | ICD-10-CM

## 2021-02-15 DIAGNOSIS — Z85038 Personal history of other malignant neoplasm of large intestine: Secondary | ICD-10-CM | POA: Insufficient documentation

## 2021-02-15 DIAGNOSIS — I10 Essential (primary) hypertension: Secondary | ICD-10-CM

## 2021-02-15 DIAGNOSIS — E782 Mixed hyperlipidemia: Secondary | ICD-10-CM

## 2021-02-15 MED ORDER — METOPROLOL TARTRATE 50 MG PO TABS: 75.0000 mg | ORAL_TABLET | Freq: Two times a day (BID) | ORAL | 3 refills | Status: DC

## 2021-02-15 NOTE — Progress Notes (Signed)
Clinical Summary [name] is a 86 y.o.female seen today for follow up of the following medical problems.   1. Palpitations - prior monitor showed PACs, PVCs, short runs of SVT longest 11 seconds   - taking lopressor 50mg  bid, pcp had increase - ongoing palpitations, frequent    2. HTN - compliant with meds     3. Unprovoked Pulmonary embolus 04/26/18 -  followed by intracranial hemorrhage on eliquis which was stopped and she's now on ASA 325 mg daily.   4. Hyperlipidemia - 09/2019 TC 130 HDL 46 TG 107 LDL 65 - 12/2020 TC 122 TG 43 HDL 52 LDL 61 - she is on atorvastatin   5. History of seizures - 12/2020 admitted with AMS, thought to be postictal from seizure   Past Medical History:  Diagnosis Date   Allergy    Arthritis    Colon cancer ([place])    colon ca dx 07/30/09   History of cardiac monitoring 07/2017   "Event monitor demonstrated sinus rhythm with isolated PACs and no arrhythmias"   History of colon cancer 06/2009   found at time of TCS 06/29/09, 1.2cm sessile cecal polyp, no adjuvent therapy needed   HTN (hypertension)    Hx of cardiovascular stress test 07/2017   "No diagnostic ST segment changes to indicate ischemia. Small, moderate intensity, reversible apical to basal inferolateral defect consistent with ischemia. This is a low risk study. Nuclear stress EF: 84%."   Hyperlipidemia    Hypothyroidism    PE (pulmonary thromboembolism) ([place])    Renal disorder    cyst on kidney    Silent micro-hemorrhage of brain ([place]) 11/25/2018   Stroke ([place])    TIA (transient ischemic attack) 11/25/2018   Vertigo      Allergies  Allergen Reactions   Tape Rash    Zio monitor adhesive causes ulcerated and infected skin .Had to see derm     Current Outpatient Medications  Medication Sig Dispense Refill   acetaminophen (TYLENOL) 500 MG tablet Take 1,000 mg by mouth every 8 (eight) hours as needed for mild pain or headache.     amLODipine (NORVASC) 5 MG tablet  Take 1 tablet (5 mg total) by mouth daily. 90 tablet 3   ascorbic acid (VITAMIN C) 250 MG CHEW Chew 250 mg by mouth daily.     ASPERCREME LIDOCAINE EX Apply 1 application topically daily as needed (for knee pain).      aspirin 325 MG EC tablet Take 325 mg by mouth daily.     atorvastatin (LIPITOR) 80 MG tablet Take 1 tablet (80 mg total) by mouth daily. 90 tablet 3   cholecalciferol (VITAMIN D3) 25 MCG (1000 UT) tablet Take 1,000 Units by mouth daily.      gabapentin (NEURONTIN) 300 MG capsule Take 300 mg by mouth 3 (three) times daily.     levETIRAcetam (KEPPRA) 500 MG tablet Take 250 mg by mouth 2 (two) times daily.     LINZESS 72 MCG capsule TAKE (1) CAPSULE BY MOUTH DAILY AS NEEDED FOR CONSTIPATION. (Patient taking differently: Take 72 mcg by mouth daily as needed (constipation).) 90 capsule 3   loratadine (CLARITIN) 10 MG tablet Take 10 mg by mouth daily.      meclizine (ANTIVERT) 25 MG tablet Take 1 tablet (25 mg total) by mouth 3 (three) times daily as needed for dizziness. 15 tablet 0   metoprolol tartrate (LOPRESSOR) 25 MG tablet Take 1 tablet (25 mg total) by mouth 2 (two)  times daily. Take 50 mg in the morning and 50 mg every evening palpitations     RESTASIS 0.05 % ophthalmic emulsion Place 1 drop into both eyes 2 (two) times daily as needed (chronic dry eye).     topiramate (TOPAMAX) 50 MG tablet Take 25 mg by mouth 2 (two) times daily.     traMADol (ULTRAM) 50 MG tablet Take 25 mg by mouth every 8 (eight) hours as needed for moderate pain.     No current facility-administered medications for this visit.     Past Surgical History:  Procedure Laterality Date   ABDOMINAL HYSTERECTOMY     COLON SURGERY  07/2009   right hemicolectomy, no residual colon cancer on path   COLONOSCOPY  07/16/2010   [id number] POLYP-TCS 3 YEARS   COLONOSCOPY N/A 08/02/2013   hyperplastic polyps, surveillance in 2020 if benefits outweight the risks   COLONOSCOPY  06/2009   1.2 cm sessile cecal  polyp which had adenocarcinoma arising in a tubular adenoma.   PARTIAL THYMECTOMY     partial thyroidectomy     benign tumors     Allergies  Allergen Reactions   Tape Rash    Zio monitor adhesive causes ulcerated and infected skin .Had to see derm      Family History  Problem Relation Age of Onset   Colon cancer Mother        >age51   Arthritis Mother    Cancer Mother    Heart disease Mother    Hyperlipidemia Mother    Hypertension Mother    Heart attack Father    Heart disease Father    Diabetes Maternal Aunt    Hyperlipidemia Daughter    Hypertension Daughter    Liver disease Neg Hx      Social History Ms. Curley reports that she has never smoked. She has never used smokeless tobacco. Ms. Yard reports no history of alcohol use.   Review of Systems CONSTITUTIONAL: No weight loss, fever, chills, weakness or fatigue.  HEENT: Eyes: No visual loss, blurred vision, double vision or yellow sclerae.No hearing loss, sneezing, congestion, runny nose or sore throat.  SKIN: No rash or itching.  CARDIOVASCULAR: per hpi RESPIRATORY: No shortness of breath, cough or sputum.  GASTROINTESTINAL: No anorexia, nausea, vomiting or diarrhea. No abdominal pain or blood.  GENITOURINARY: No burning on urination, no polyuria NEUROLOGICAL: No headache, dizziness, syncope, paralysis, ataxia, numbness or tingling in the extremities. No change in bowel or bladder control.  MUSCULOSKELETAL: No muscle, back pain, joint pain or stiffness.  LYMPHATICS: No enlarged nodes. No history of splenectomy.  PSYCHIATRIC: No history of depression or anxiety.  ENDOCRINOLOGIC: No reports of sweating, cold or heat intolerance. No polyuria or polydipsia.  Marland Kitchen   Physical Examination Today's Vitals   02/15/21 1447  BP: 128/78  Pulse: 70  SpO2: 98%  Weight: 159 lb 12.8 oz (72.5 kg)  Height: 5\' 4"  (1.626 m)   Body mass index is 27.43 kg/m.  Gen: resting comfortably, no acute  distress HEENT: no scleral icterus, pupils equal round and reactive, no palptable cervical adenopathy,  CV: RRR, no m/r/g, no jvd Resp: Clear to auscultation bilaterally GI: abdomen is soft, non-tender, non-distended, normal bowel sounds, no hepatosplenomegaly MSK: extremities are warm, no edema.  Skin: warm, no rash Neuro:  no focal deficits Psych: appropriate affect   Diagnostic Studies     Assessment and Plan   1. Palpitations - ongoing symptoms, prior monitor showed PAC/PVCs and SVT - increase lopressor to  75mg  bid   2. HTN -at goal, cotinue current meds   3. Hyperlipdiemia - she is at goal, continue atorvatstatin    Arnoldo Lenis, M.D.

## 2021-02-15 NOTE — Patient Instructions (Signed)
Medication Instructions:  Your physician has recommended you make the following change in your medication:  INCREASE Lopressor to 75 mg tablets twice daily  *If you need a refill on your cardiac medications before your next appointment, please call your pharmacy*   Lab Work: None If you have labs (blood work) drawn today and your tests are completely normal, you will receive your results only by: [facility] (if you have MyChart) OR A paper copy in the mail If you have any lab test that is abnormal or we need to change your treatment, we will call you to review the results.   Testing/Procedures: None   Follow-Up: At [facility], you and your health needs are our priority.  As part of our continuing mission to provide you with exceptional heart care, we have created designated Provider Care Teams.  These Care Teams include your primary Cardiologist (physician) and Advanced Practice Providers (APPs -  Physician Assistants and Nurse Practitioners) who all work together to provide you with the care you need, when you need it.  We recommend signing up for the patient portal called "MyChart".  Sign up information is provided on this After Visit Summary.  MyChart is used to connect with patients for Virtual Visits (Telemedicine).  Patients are able to view lab/test results, encounter notes, upcoming appointments, etc.  Non-urgent messages can be sent to your provider as well.   To learn more about what you can do with MyChart, go to [email or web address].    Your next appointment:   6 month(s)  The format for your next appointment:   In Person  Provider:   Carlyle Dolly, MD    Other Instructions

## 2021-02-15 NOTE — Patient Instructions (Addendum)
[name] at [facility] Discharge Instructions  You were seen and examined today by Dr. Delton Coombes. He reviewed your most recent labs and everything looks okay except your D-Dimer is still elevated. If you develop chest pain or shortness of breath please go to the ER. If one ankle swells noticeably larger than the other one, hurts or is warm to the touch please call our office immediately. Please keep follow up appointment as scheduled.   Thank you for choosing [place] at [facility] to provide your oncology and hematology care.  To afford each patient quality time with our provider, please arrive at least 15 minutes before your scheduled appointment time.   If you have a lab appointment with the [facility] please come in thru the Main Entrance and check in at the main information desk.  You need to re-schedule your appointment should you arrive 10 or more minutes late.  We strive to give you quality time with our providers, and arriving late affects you and other patients whose appointments are after yours.  Also, if you no show three or more times for appointments you may be dismissed from the clinic at the providers discretion.     Again, thank you for choosing [facility].  Our hope is that these requests will decrease the amount of time that you wait before being seen by our physicians.       _____________________________________________________________  Should you have questions after your visit to [facility], please contact our office at [phone] and follow the prompts.  Our office hours are 8:00 a.m. and 4:30 p.m. Monday - Friday.  Please note that voicemails left after 4:00 p.m. may not be returned until the following business day.  We are closed weekends and major holidays.  You do have access to a nurse 24-7, just call the main number to the clinic [phone] and do not press any options, hold on the  line and a nurse will answer the phone.    For prescription refill requests, have your pharmacy contact our office and allow 72 hours.    Due to Covid, you will need to wear a mask upon entering the hospital. If you do not have a mask, a mask will be given to you at the Main Entrance upon arrival. For doctor visits, patients may have 1 support person age 58 or older with them. For treatment visits, patients can not have anyone with them due to social distancing guidelines and our immunocompromised population.

## 2021-03-09 ENCOUNTER — Other Ambulatory Visit (HOSPITAL_COMMUNITY): Payer: Self-pay | Admitting: Nephrology

## 2021-03-09 ENCOUNTER — Other Ambulatory Visit: Payer: Self-pay | Admitting: Nephrology

## 2021-03-09 DIAGNOSIS — I129 Hypertensive chronic kidney disease with stage 1 through stage 4 chronic kidney disease, or unspecified chronic kidney disease: Secondary | ICD-10-CM

## 2021-03-09 DIAGNOSIS — E1122 Type 2 diabetes mellitus with diabetic chronic kidney disease: Secondary | ICD-10-CM

## 2021-03-18 ENCOUNTER — Other Ambulatory Visit: Payer: Self-pay

## 2021-03-18 ENCOUNTER — Ambulatory Visit (HOSPITAL_COMMUNITY)
Admission: RE | Admit: 2021-03-18 | Discharge: 2021-03-18 | Disposition: A | Discharge status: Home / Self Care | Payer: Medicare Other | Source: Ambulatory Visit | Attending: Nephrology | Admitting: Nephrology

## 2021-03-18 DIAGNOSIS — E1122 Type 2 diabetes mellitus with diabetic chronic kidney disease: Secondary | ICD-10-CM | POA: Diagnosis not present

## 2021-03-18 DIAGNOSIS — N281 Cyst of kidney, acquired: Secondary | ICD-10-CM | POA: Diagnosis not present

## 2021-03-18 DIAGNOSIS — N189 Chronic kidney disease, unspecified: Secondary | ICD-10-CM | POA: Diagnosis not present

## 2021-03-18 DIAGNOSIS — I129 Hypertensive chronic kidney disease with stage 1 through stage 4 chronic kidney disease, or unspecified chronic kidney disease: Secondary | ICD-10-CM | POA: Insufficient documentation

## 2021-03-23 ENCOUNTER — Telehealth: Payer: Self-pay | Admitting: Internal Medicine

## 2021-03-24 DIAGNOSIS — E1122 Type 2 diabetes mellitus with diabetic chronic kidney disease: Secondary | ICD-10-CM | POA: Diagnosis not present

## 2021-03-24 DIAGNOSIS — Z79899 Other long term (current) drug therapy: Secondary | ICD-10-CM | POA: Diagnosis not present

## 2021-03-24 DIAGNOSIS — D519 Vitamin B12 deficiency anemia, unspecified: Secondary | ICD-10-CM | POA: Diagnosis not present

## 2021-03-24 DIAGNOSIS — N189 Chronic kidney disease, unspecified: Secondary | ICD-10-CM | POA: Diagnosis not present

## 2021-03-24 DIAGNOSIS — I5032 Chronic diastolic (congestive) heart failure: Secondary | ICD-10-CM | POA: Diagnosis not present

## 2021-03-24 DIAGNOSIS — I129 Hypertensive chronic kidney disease with stage 1 through stage 4 chronic kidney disease, or unspecified chronic kidney disease: Secondary | ICD-10-CM | POA: Diagnosis not present

## 2021-04-01 DIAGNOSIS — I129 Hypertensive chronic kidney disease with stage 1 through stage 4 chronic kidney disease, or unspecified chronic kidney disease: Secondary | ICD-10-CM | POA: Diagnosis not present

## 2021-04-01 DIAGNOSIS — E1122 Type 2 diabetes mellitus with diabetic chronic kidney disease: Secondary | ICD-10-CM | POA: Diagnosis not present

## 2021-04-01 DIAGNOSIS — N189 Chronic kidney disease, unspecified: Secondary | ICD-10-CM | POA: Diagnosis not present

## 2021-04-01 DIAGNOSIS — I5032 Chronic diastolic (congestive) heart failure: Secondary | ICD-10-CM | POA: Diagnosis not present

## 2021-04-01 DIAGNOSIS — N281 Cyst of kidney, acquired: Secondary | ICD-10-CM | POA: Diagnosis not present

## 2021-04-05 ENCOUNTER — Encounter: Payer: Self-pay | Admitting: Cardiology

## 2021-04-07 ENCOUNTER — Other Ambulatory Visit: Payer: Self-pay | Admitting: *Deleted

## 2021-04-07 MED ORDER — METOPROLOL TARTRATE 50 MG PO TABS: 100.0000 mg | ORAL_TABLET | Freq: Two times a day (BID) | ORAL | 3 refills | Status: DC

## 2021-04-07 NOTE — Telephone Encounter (Signed)
Increase lopressor to 100mg  bid and update [place] again in 1 week please   Zandra Abts MD

## 2021-04-07 NOTE — Telephone Encounter (Signed)
Increase lopressor to 100mg  bid and update [place] again in 1 week please     Zandra Abts MD

## 2021-04-19 ENCOUNTER — Ambulatory Visit: Payer: Medicare Other | Admitting: Internal Medicine

## 2021-04-21 ENCOUNTER — Ambulatory Visit: Payer: Medicare Other | Admitting: Internal Medicine

## 2021-05-05 ENCOUNTER — Telehealth: Payer: Self-pay

## 2021-05-05 ENCOUNTER — Telehealth: Payer: Self-pay | Admitting: Cardiology

## 2021-05-05 NOTE — Telephone Encounter (Signed)
Patient c/o Palpitations:  High priority if patient c/o lightheadedness, shortness of breath, or chest pain  How long have you had palpitations/irregular HR/ Afib? Are you having the symptoms now? Palpitations- not at this time-daily  Are you currently experiencing lightheadedness, SOB or CP? no  Do you have a history of afib (atrial fibrillation) or irregular heart rhythm?   Have you checked your BP or HR? (document readings if available):   Are you experiencing any other symptoms? No other symptoms- daughter would like for patient to be seen- first available appointment was made for 07-08-21

## 2021-05-05 NOTE — Telephone Encounter (Signed)
Pt's daughter stated that pt is still having palpitations even with the dose increase of Lopressor 100 mg BID. Pt is taking extra 0.5 tablet as needed and still not having any relief. Pt has appt with provider on 07/08/21 (1st available). Will route to provider for review/recommendations.

## 2021-05-05 NOTE — Telephone Encounter (Signed)
Talked with [facility] Billing Webb Silversmith) and Ms Zanders daughter, Lina Sar [phone].  12/15/20 E&M code was incorrectly billed.  This code has been corrected per Webb Silversmith at [facility] billing and re-sent to [facility].  This should be taken care of as soon as the new claim is processed by [facility].  I have mailed Vickie a screen shot of the correction.  This should be ok now.  Vickie is aware.

## 2021-05-06 NOTE — Telephone Encounter (Signed)
Add diltiazem 30mg  bid to current regimen (do not stop any prior meds) and update [place] on symptoms 1 week  J Elvera Almario MD

## 2021-05-07 ENCOUNTER — Telehealth: Payer: Self-pay | Admitting: Cardiology

## 2021-05-07 MED ORDER — DILTIAZEM HCL 30 MG PO TABS: 30.0000 mg | ORAL_TABLET | Freq: Two times a day (BID) | ORAL | 3 refills | Status: DC

## 2021-05-07 NOTE — Telephone Encounter (Signed)
Per Dr. Harl Bowie no medications are to be stopped when adding Dilt 30 mg BID

## 2021-05-07 NOTE — Telephone Encounter (Signed)
Pt's daughter verbalized understanding. Pt's daughter to call back in one week to update our office.

## 2021-05-07 NOTE — Telephone Encounter (Signed)
Pt c/o medication issue:  1. Name of Medication: amLODipine (NORVASC) 5 MG tablet  2. How are you currently taking this medication (dosage and times per day)? Take 1 tablet (5 mg total) by mouth daily.  3. Are you having a reaction (difficulty breathing--STAT)? NO  4. What is your medication issue? Pt was recently prescribed diltiazem (CARDIZEM) 30 MG tablet and would like to know if this is to replace amlodipine or if the pt is to take both meds... please advise

## 2021-05-07 NOTE — Telephone Encounter (Signed)
Spoke to Bressler at [facility] and verbalized Dr. Nelly Laurence order. She verbalized understanding.

## 2021-05-10 ENCOUNTER — Other Ambulatory Visit: Payer: Self-pay | Admitting: Internal Medicine

## 2021-05-10 DIAGNOSIS — E782 Mixed hyperlipidemia: Secondary | ICD-10-CM

## 2021-05-15 ENCOUNTER — Encounter: Payer: Self-pay | Admitting: Cardiology

## 2021-07-06 DIAGNOSIS — G890 Central pain syndrome: Secondary | ICD-10-CM | POA: Diagnosis not present

## 2021-07-06 DIAGNOSIS — I10 Essential (primary) hypertension: Secondary | ICD-10-CM | POA: Diagnosis not present

## 2021-07-06 DIAGNOSIS — I129 Hypertensive chronic kidney disease with stage 1 through stage 4 chronic kidney disease, or unspecified chronic kidney disease: Secondary | ICD-10-CM | POA: Diagnosis not present

## 2021-07-06 DIAGNOSIS — I5032 Chronic diastolic (congestive) heart failure: Secondary | ICD-10-CM | POA: Diagnosis not present

## 2021-07-06 DIAGNOSIS — M542 Cervicalgia: Secondary | ICD-10-CM | POA: Diagnosis not present

## 2021-07-06 DIAGNOSIS — N281 Cyst of kidney, acquired: Secondary | ICD-10-CM | POA: Diagnosis not present

## 2021-07-06 DIAGNOSIS — E1122 Type 2 diabetes mellitus with diabetic chronic kidney disease: Secondary | ICD-10-CM | POA: Diagnosis not present

## 2021-07-06 DIAGNOSIS — M130 Polyarthritis, unspecified: Secondary | ICD-10-CM | POA: Diagnosis not present

## 2021-07-06 DIAGNOSIS — N189 Chronic kidney disease, unspecified: Secondary | ICD-10-CM | POA: Diagnosis not present

## 2021-07-08 ENCOUNTER — Ambulatory Visit: Payer: Medicare Other | Admitting: Cardiology

## 2021-07-08 DIAGNOSIS — I129 Hypertensive chronic kidney disease with stage 1 through stage 4 chronic kidney disease, or unspecified chronic kidney disease: Secondary | ICD-10-CM | POA: Diagnosis not present

## 2021-07-08 DIAGNOSIS — E870 Hyperosmolality and hypernatremia: Secondary | ICD-10-CM | POA: Diagnosis not present

## 2021-07-08 DIAGNOSIS — I5032 Chronic diastolic (congestive) heart failure: Secondary | ICD-10-CM | POA: Diagnosis not present

## 2021-07-08 DIAGNOSIS — E1122 Type 2 diabetes mellitus with diabetic chronic kidney disease: Secondary | ICD-10-CM | POA: Diagnosis not present

## 2021-07-08 DIAGNOSIS — N189 Chronic kidney disease, unspecified: Secondary | ICD-10-CM | POA: Diagnosis not present

## 2021-07-19 ENCOUNTER — Encounter: Payer: Self-pay | Admitting: Internal Medicine

## 2021-07-19 ENCOUNTER — Ambulatory Visit (INDEPENDENT_AMBULATORY_CARE_PROVIDER_SITE_OTHER): Payer: Medicare Other | Admitting: Internal Medicine

## 2021-07-19 VITALS — BP 132/76 | HR 69 | Resp 18 | Ht 64.0 in | Wt 160.6 lb

## 2021-07-19 DIAGNOSIS — G8929 Other chronic pain: Secondary | ICD-10-CM | POA: Diagnosis not present

## 2021-07-19 DIAGNOSIS — E039 Hypothyroidism, unspecified: Secondary | ICD-10-CM | POA: Diagnosis not present

## 2021-07-19 DIAGNOSIS — G8194 Hemiplegia, unspecified affecting left nondominant side: Secondary | ICD-10-CM

## 2021-07-19 DIAGNOSIS — I10 Essential (primary) hypertension: Secondary | ICD-10-CM | POA: Diagnosis not present

## 2021-07-19 DIAGNOSIS — M25512 Pain in left shoulder: Secondary | ICD-10-CM | POA: Diagnosis not present

## 2021-07-19 DIAGNOSIS — R7303 Prediabetes: Secondary | ICD-10-CM | POA: Diagnosis not present

## 2021-07-19 DIAGNOSIS — N1832 Chronic kidney disease, stage 3b: Secondary | ICD-10-CM | POA: Diagnosis not present

## 2021-07-19 NOTE — Patient Instructions (Signed)
Please continue to perform leg elevation as tolerated. ? ?Please continue to follow low salt diet and ambulate as tolerated. ? ?Please consider getting Shingrix and Tdap vaccines at your local pharmacy. ?

## 2021-07-23 DIAGNOSIS — G8929 Other chronic pain: Secondary | ICD-10-CM | POA: Insufficient documentation

## 2021-07-23 NOTE — Assessment & Plan Note (Addendum)
Lab Results  ?Component Value Date  ? HGBA1C 6.1 (H) 01/06/2021  ? ?Diet controlled ?

## 2021-07-23 NOTE — Assessment & Plan Note (Signed)
BP Readings from Last 1 Encounters:  ?07/19/21 132/76  ? ?Well-controlled with Amlodipine and Metoprolol ?Counseled for compliance with the medications ?Advised DASH diet and moderate exercise/walking as tolerated ?

## 2021-07-23 NOTE — Assessment & Plan Note (Signed)
Lab Results  ?Component Value Date  ? TSH 0.357 01/06/2021  ? ?On Levothyroxine ?Check TSH and free T4 ?

## 2021-07-23 NOTE — Assessment & Plan Note (Signed)
Likely due to OA of shoulder ?Improved with Tylenol and Aspercreme ?If persistent or worse, will refer to Orthopedic surgery ?

## 2021-07-23 NOTE — Assessment & Plan Note (Signed)
Last CMP reviewed, stable CKD stage 3b ?F/u with Nephrology ?Avoid nephrotoxic agents ?No proteinuria currently according to Nephrology chart ?

## 2021-07-23 NOTE — Progress Notes (Signed)
? ?Established Patient Office Visit ? ?Subjective:  ?Patient ID: [name], female    DOB: 06/07/1934  Age: 87 y.o. MRN: [id number] ? ?CC:  ?Chief Complaint  ?Patient presents with  ? Follow-up  ?  4 month follow up HTN and leg swelling pt has left shoulder ache sometimes left hand aches pt leg swelling has been better when she wears compression hose   ? ? ?HPI ?[name] is a 87 y.o. female with past medical history of HTN, prediabetes, CVA with residual left sides numbness and weakness, ?partial seizure, hypothyroidism, colon ca. s/p right hemicolectomy, chronic constipation, PE and CKD stage 3 who presents for f/u of her chronic medical conditions. ? ?HTN: BP is well-controlled. Takes medications regularly. Patient denies headache, dizziness, chest pain, dyspnea or palpitations. ? ?Her leg swelling has improved with leg elevation and compression stockings.  She still has mild ankle swelling bilaterally. ? ?She has chronic left shoulder pain, which is worse with movement.  She has tried taking Tylenol with good relief of her shoulder pain.  She denies any numbness or tingling of the UE.  Denies any recent injury. ? ?She follows up with Dr Theador Hawthorne for CKD stage 3. ? ?She takes levothyroxine for hypothyroidism.  She denies any recent change in weight or appetite. ? ?Past Medical History:  ?Diagnosis Date  ? Allergy   ? Arthritis   ? Colon cancer ([place])   ? colon ca dx 07/30/09  ? History of cardiac monitoring 07/2017  ? "Event monitor demonstrated sinus rhythm with isolated PACs and no arrhythmias"  ? History of colon cancer 06/2009  ? found at time of TCS 06/29/09, 1.2cm sessile cecal polyp, no adjuvent therapy needed  ? HTN (hypertension)   ? Hx of cardiovascular stress test 07/2017  ? "No diagnostic ST segment changes to indicate ischemia. Small, moderate intensity, reversible apical to basal inferolateral defect consistent with ischemia. This is a low risk study. Nuclear stress EF: 84%."  ?  Hyperlipidemia   ? Hypothyroidism   ? PE (pulmonary thromboembolism) ([place])   ? Renal disorder   ? cyst on kidney   ? Silent micro-hemorrhage of brain ([place]) 11/25/2018  ? Stroke [facility])   ? TIA (transient ischemic attack) 11/25/2018  ? Vertigo   ? ? ?Past Surgical History:  ?Procedure Laterality Date  ? ABDOMINAL HYSTERECTOMY    ? COLON SURGERY  07/2009  ? right hemicolectomy, no residual colon cancer on path  ? COLONOSCOPY  07/16/2010  ? TDD:[id number] POLYP-TCS 3 YEARS  ? COLONOSCOPY N/A 08/02/2013  ? hyperplastic polyps, surveillance in 2020 if benefits outweight the risks  ? COLONOSCOPY  06/2009  ? 1.2 cm sessile cecal polyp which had adenocarcinoma arising in a tubular adenoma.  ? PARTIAL THYMECTOMY    ? partial thyroidectomy    ? benign tumors  ? ? ?Family History  ?Problem Relation Age of Onset  ? Colon cancer Mother   ?     >age60  ? Arthritis Mother   ? Cancer Mother   ? Heart disease Mother   ? Hyperlipidemia Mother   ? Hypertension Mother   ? Heart attack Father   ? Heart disease Father   ? Diabetes Maternal Aunt   ? Hyperlipidemia Daughter   ? Hypertension Daughter   ? Liver disease Neg Hx   ? ? ?Social History  ? ?Socioeconomic History  ? Marital status: Widowed  ?  Spouse name: Not on file  ? Number of children: 2  ?  Years of education: Not on file  ? Highest education level: Not on file  ?Occupational History  ? Occupation: Psychologist, occupational at [employer]  ?  Employer: RETIRED  ? Occupation: retired from [employer]  ?Tobacco Use  ? Smoking status: Never  ? Smokeless tobacco: Never  ?Vaping Use  ? Vaping Use: Never used  ?Substance and Sexual Activity  ? Alcohol use: No  ?  Alcohol/week: 0.0 standard drinks  ? Drug use: No  ? Sexual activity: Not Currently  ?Other Topics Concern  ? Not on file  ?Social History Narrative  ? Not on file  ? ?Social Determinants of Health  ? ?Financial Resource Strain: Low Risk   ? Difficulty of Paying Living Expenses: Not very hard  ?Food Insecurity: No Food Insecurity  ? Worried About Paediatric nurse in the Last Year: Never true  ? Ran Out of Food in the Last Year: Never true  ?Transportation Needs: No Transportation Needs  ? Lack of Transportation (Medical): No  ? Lack of Transportation (Non-Medical): No  ?Physical Activity: Inactive  ? Days of Exercise per Week: 0 days  ? Minutes of Exercise per Session: 0 min  ?Stress: No Stress Concern Present  ? Feeling of Stress : Only a little  ?Social Connections: Moderately Isolated  ? Frequency of Communication with Friends and Family: More than three times a week  ? Frequency of Social Gatherings with Friends and Family: More than three times a week  ? Attends Religious Services: More than 4 times per year  ? Active Member of Clubs or Organizations: No  ? Attends Archivist Meetings: Never  ? Marital Status: Widowed  ?Intimate Partner Violence: Not At Risk  ? Fear of Current or Ex-Partner: No  ? Emotionally Abused: No  ? Physically Abused: No  ? Sexually Abused: No  ? ? ?Outpatient Medications Prior to Visit  ?Medication Sig Dispense Refill  ? acetaminophen (TYLENOL) 500 MG tablet Take 1,000 mg by mouth every 8 (eight) hours as needed for mild pain or headache.    ? amLODipine (NORVASC) 5 MG tablet Take 1 tablet (5 mg total) by mouth daily. 90 tablet 3  ? ascorbic acid (VITAMIN C) 250 MG CHEW Chew 250 mg by mouth daily.    ? ASPERCREME LIDOCAINE EX Apply 1 application topically daily as needed (for knee pain).     ? aspirin 325 MG EC tablet Take 325 mg by mouth daily.    ? atorvastatin (LIPITOR) 80 MG tablet TAKE 1 TABLET BY MOUTH ONCE A DAY. 90 tablet 0  ? cholecalciferol (VITAMIN D3) 25 MCG (1000 UT) tablet Take 1,000 Units by mouth daily.     ? diltiazem (CARDIZEM) 30 MG tablet Take 1 tablet (30 mg total) by mouth 2 (two) times daily. 180 tablet 3  ? gabapentin (NEURONTIN) 300 MG capsule Take 300 mg by mouth 3 (three) times daily.    ? levETIRAcetam (KEPPRA) 500 MG tablet Take 250 mg by mouth 2 (two) times daily.    ? LINZESS 72 MCG capsule  TAKE (1) CAPSULE BY MOUTH DAILY AS NEEDED FOR CONSTIPATION. (Patient taking differently: Take 72 mcg by mouth daily as needed (constipation).) 90 capsule 3  ? loratadine (CLARITIN) 10 MG tablet Take 10 mg by mouth daily.     ? meclizine (ANTIVERT) 25 MG tablet Take 1 tablet (25 mg total) by mouth 3 (three) times daily as needed for dizziness. 15 tablet 0  ? metoprolol tartrate (LOPRESSOR) 50 MG tablet Take  2 tablets (100 mg total) by mouth 2 (two) times daily. 360 tablet 3  ? RESTASIS 0.05 % ophthalmic emulsion Place 1 drop into both eyes 2 (two) times daily as needed (chronic dry eye).    ? topiramate (TOPAMAX) 50 MG tablet Topamax 50 mg tablet ? Take 1 tablet twice a day by oral route.    ? traMADol (ULTRAM) 50 MG tablet Take 25 mg by mouth every 8 (eight) hours as needed for moderate pain.    ? ?No facility-administered medications prior to visit.  ? ? ?Allergies  ?Allergen Reactions  ? Tape Rash  ?  Zio monitor adhesive causes ulcerated and infected skin .Had to see derm  ? ? ?ROS ?Review of Systems ? ?  ?Objective:  ?  ?Physical Exam ? ?BP 132/76 (BP Location: Right Arm, Patient Position: Sitting, Cuff Size: Normal)   Pulse 69   Resp 18   Ht $R'5\' 4"'bO$  (1.626 m)   Wt 160 lb 9.6 oz (72.8 kg)   SpO2 98%   BMI 27.57 kg/m?  ?Wt Readings from Last 3 Encounters:  ?07/19/21 160 lb 9.6 oz (72.8 kg)  ?02/15/21 161 lb (73 kg)  ?02/15/21 159 lb 12.8 oz (72.5 kg)  ? ? ?Lab Results  ?Component Value Date  ? TSH 0.357 01/06/2021  ? ?Lab Results  ?Component Value Date  ? WBC 4.9 02/08/2021  ? HGB 13.5 02/08/2021  ? HCT 42.1 02/08/2021  ? MCV 99.3 02/08/2021  ? PLT 258 02/08/2021  ? ?Lab Results  ?Component Value Date  ? NA 140 02/08/2021  ? K 3.5 02/08/2021  ? CHLORIDE 105 04/16/2015  ? CO2 23 02/08/2021  ? GLUCOSE 93 02/08/2021  ? BUN 15 02/08/2021  ? CREATININE 1.23 (H) 02/08/2021  ? BILITOT 0.8 02/08/2021  ? ALKPHOS 92 02/08/2021  ? AST 20 02/08/2021  ? ALT 19 02/08/2021  ? PROT 7.4 02/08/2021  ? ALBUMIN 4.0 02/08/2021  ?  CALCIUM 9.3 02/08/2021  ? ANIONGAP 8 02/08/2021  ? EGFR 39 (L) 01/13/2021  ? ?Lab Results  ?Component Value Date  ? CHOL 122 01/07/2021  ? ?Lab Results  ?Component Value Date  ? HDL 52 01/07/2021  ? ?Lab Re

## 2021-07-23 NOTE — Assessment & Plan Note (Signed)
H/o multiple TIAs ?On Aspirin and statin ?F/u with Neurology ?

## 2021-07-26 LAB — POCT GLYCOSYLATED HEMOGLOBIN (HGB A1C)
HbA1c, POC (controlled diabetic range): 5.8 % (ref 0.0–7.0)
HbA1c, POC (prediabetic range): 5.8 % (ref 5.7–6.4)

## 2021-07-26 NOTE — Addendum Note (Signed)
Addended by: Zacarias Pontes R on: 07/26/2021 03:00 PM ? ? Modules accepted: Orders ? ?

## 2021-07-28 ENCOUNTER — Encounter: Payer: Self-pay | Admitting: *Deleted

## 2021-08-02 ENCOUNTER — Encounter (HOSPITAL_COMMUNITY): Payer: Self-pay

## 2021-08-02 ENCOUNTER — Other Ambulatory Visit: Payer: Self-pay

## 2021-08-02 ENCOUNTER — Emergency Department (HOSPITAL_COMMUNITY): Payer: Medicare Other

## 2021-08-02 ENCOUNTER — Observation Stay (HOSPITAL_COMMUNITY): Payer: Medicare Other

## 2021-08-02 ENCOUNTER — Observation Stay (HOSPITAL_COMMUNITY)
Admission: EM | Admit: 2021-08-02 | Discharge: 2021-08-03 | Disposition: A | Discharge status: Home Health | Payer: Medicare Other | Attending: Internal Medicine | Admitting: Internal Medicine

## 2021-08-02 DIAGNOSIS — R200 Anesthesia of skin: Secondary | ICD-10-CM | POA: Diagnosis not present

## 2021-08-02 DIAGNOSIS — R8281 Pyuria: Secondary | ICD-10-CM | POA: Insufficient documentation

## 2021-08-02 DIAGNOSIS — Z66 Do not resuscitate: Secondary | ICD-10-CM | POA: Diagnosis not present

## 2021-08-02 DIAGNOSIS — R2689 Other abnormalities of gait and mobility: Secondary | ICD-10-CM | POA: Insufficient documentation

## 2021-08-02 DIAGNOSIS — Z800 Family history of malignant neoplasm of digestive organs: Secondary | ICD-10-CM | POA: Diagnosis not present

## 2021-08-02 DIAGNOSIS — Z7902 Long term (current) use of antithrombotics/antiplatelets: Secondary | ICD-10-CM | POA: Insufficient documentation

## 2021-08-02 DIAGNOSIS — I10 Essential (primary) hypertension: Secondary | ICD-10-CM | POA: Diagnosis present

## 2021-08-02 DIAGNOSIS — E782 Mixed hyperlipidemia: Secondary | ICD-10-CM | POA: Diagnosis not present

## 2021-08-02 DIAGNOSIS — Z8349 Family history of other endocrine, nutritional and metabolic diseases: Secondary | ICD-10-CM | POA: Insufficient documentation

## 2021-08-02 DIAGNOSIS — G319 Degenerative disease of nervous system, unspecified: Secondary | ICD-10-CM | POA: Diagnosis not present

## 2021-08-02 DIAGNOSIS — Z8719 Personal history of other diseases of the digestive system: Secondary | ICD-10-CM | POA: Insufficient documentation

## 2021-08-02 DIAGNOSIS — E039 Hypothyroidism, unspecified: Secondary | ICD-10-CM | POA: Insufficient documentation

## 2021-08-02 DIAGNOSIS — Z85038 Personal history of other malignant neoplasm of large intestine: Secondary | ICD-10-CM | POA: Insufficient documentation

## 2021-08-02 DIAGNOSIS — R41841 Cognitive communication deficit: Secondary | ICD-10-CM | POA: Insufficient documentation

## 2021-08-02 DIAGNOSIS — Z8249 Family history of ischemic heart disease and other diseases of the circulatory system: Secondary | ICD-10-CM | POA: Insufficient documentation

## 2021-08-02 DIAGNOSIS — N184 Chronic kidney disease, stage 4 (severe): Secondary | ICD-10-CM

## 2021-08-02 DIAGNOSIS — N1832 Chronic kidney disease, stage 3b: Secondary | ICD-10-CM | POA: Insufficient documentation

## 2021-08-02 DIAGNOSIS — Z7982 Long term (current) use of aspirin: Secondary | ICD-10-CM | POA: Diagnosis not present

## 2021-08-02 DIAGNOSIS — I493 Ventricular premature depolarization: Secondary | ICD-10-CM | POA: Insufficient documentation

## 2021-08-02 DIAGNOSIS — Z79899 Other long term (current) drug therapy: Secondary | ICD-10-CM | POA: Diagnosis not present

## 2021-08-02 DIAGNOSIS — I6782 Cerebral ischemia: Secondary | ICD-10-CM | POA: Diagnosis not present

## 2021-08-02 DIAGNOSIS — Z8673 Personal history of transient ischemic attack (TIA), and cerebral infarction without residual deficits: Secondary | ICD-10-CM | POA: Insufficient documentation

## 2021-08-02 DIAGNOSIS — G459 Transient cerebral ischemic attack, unspecified: Secondary | ICD-10-CM | POA: Insufficient documentation

## 2021-08-02 DIAGNOSIS — G40209 Localization-related (focal) (partial) symptomatic epilepsy and epileptic syndromes with complex partial seizures, not intractable, without status epilepticus: Secondary | ICD-10-CM | POA: Diagnosis present

## 2021-08-02 DIAGNOSIS — E860 Dehydration: Secondary | ICD-10-CM | POA: Insufficient documentation

## 2021-08-02 DIAGNOSIS — Z20822 Contact with and (suspected) exposure to covid-19: Secondary | ICD-10-CM | POA: Diagnosis not present

## 2021-08-02 DIAGNOSIS — I1310 Hypertensive heart and chronic kidney disease without heart failure, with stage 1 through stage 4 chronic kidney disease, or unspecified chronic kidney disease: Secondary | ICD-10-CM | POA: Diagnosis not present

## 2021-08-02 DIAGNOSIS — R2681 Unsteadiness on feet: Secondary | ICD-10-CM | POA: Insufficient documentation

## 2021-08-02 DIAGNOSIS — Z86711 Personal history of pulmonary embolism: Secondary | ICD-10-CM | POA: Diagnosis not present

## 2021-08-02 DIAGNOSIS — R209 Unspecified disturbances of skin sensation: Secondary | ICD-10-CM

## 2021-08-02 DIAGNOSIS — R29898 Other symptoms and signs involving the musculoskeletal system: Secondary | ICD-10-CM | POA: Insufficient documentation

## 2021-08-02 DIAGNOSIS — E049 Nontoxic goiter, unspecified: Secondary | ICD-10-CM | POA: Diagnosis not present

## 2021-08-02 DIAGNOSIS — M6281 Muscle weakness (generalized): Secondary | ICD-10-CM | POA: Insufficient documentation

## 2021-08-02 DIAGNOSIS — M4312 Spondylolisthesis, cervical region: Secondary | ICD-10-CM | POA: Diagnosis not present

## 2021-08-02 DIAGNOSIS — R7302 Impaired glucose tolerance (oral): Secondary | ICD-10-CM | POA: Diagnosis not present

## 2021-08-02 DIAGNOSIS — I63233 Cerebral infarction due to unspecified occlusion or stenosis of bilateral carotid arteries: Secondary | ICD-10-CM | POA: Insufficient documentation

## 2021-08-02 DIAGNOSIS — I6523 Occlusion and stenosis of bilateral carotid arteries: Secondary | ICD-10-CM | POA: Diagnosis not present

## 2021-08-02 DIAGNOSIS — I639 Cerebral infarction, unspecified: Secondary | ICD-10-CM | POA: Diagnosis not present

## 2021-08-02 DIAGNOSIS — I129 Hypertensive chronic kidney disease with stage 1 through stage 4 chronic kidney disease, or unspecified chronic kidney disease: Secondary | ICD-10-CM | POA: Insufficient documentation

## 2021-08-02 LAB — COMPREHENSIVE METABOLIC PANEL
ALT: 20 U/L (ref 0–44)
AST: 20 U/L (ref 15–41)
Albumin: 4.2 g/dL (ref 3.5–5.0)
Alkaline Phosphatase: 98 U/L (ref 38–126)
Anion gap: 6 (ref 5–15)
BUN: 26 mg/dL — ABNORMAL HIGH (ref 8–23)
CO2: 25 mmol/L (ref 22–32)
Calcium: 9.3 mg/dL (ref 8.9–10.3)
Chloride: 112 mmol/L — ABNORMAL HIGH (ref 98–111)
Creatinine, Ser: 1.61 mg/dL — ABNORMAL HIGH (ref 0.44–1.00)
GFR, Estimated: 31 mL/min — ABNORMAL LOW (ref >60)
Glucose, Bld: 107 mg/dL — ABNORMAL HIGH (ref 70–99)
Potassium: 3.7 mmol/L (ref 3.5–5.1)
Sodium: 143 mmol/L (ref 135–145)
Total Bilirubin: 0.3 mg/dL (ref 0.3–1.2)
Total Protein: 7.6 g/dL (ref 6.5–8.1)

## 2021-08-02 LAB — URINALYSIS, ROUTINE W REFLEX MICROSCOPIC
Bacteria, UA: NONE SEEN
Bilirubin Urine: NEGATIVE
Glucose, UA: NEGATIVE mg/dL
Hgb urine dipstick: NEGATIVE
Ketones, ur: NEGATIVE mg/dL
Nitrite: NEGATIVE
Protein, ur: NEGATIVE mg/dL
Specific Gravity, Urine: 1.008 (ref 1.005–1.030)
pH: 7 (ref 5.0–8.0)

## 2021-08-02 LAB — I-STAT CHEM 8, ED
BUN: 26 mg/dL — ABNORMAL HIGH (ref 8–23)
Calcium, Ion: 1.24 mmol/L (ref 1.15–1.40)
Chloride: 109 mmol/L (ref 98–111)
Creatinine, Ser: 1.6 mg/dL — ABNORMAL HIGH (ref 0.44–1.00)
Glucose, Bld: 104 mg/dL — ABNORMAL HIGH (ref 70–99)
HCT: 44 % (ref 36.0–46.0)
Hemoglobin: 15 g/dL (ref 12.0–15.0)
Potassium: 3.8 mmol/L (ref 3.5–5.1)
Sodium: 145 mmol/L (ref 135–145)
TCO2: 26 mmol/L (ref 22–32)

## 2021-08-02 LAB — DIFFERENTIAL
Abs Immature Granulocytes: 0.01 10*3/uL (ref 0.00–0.07)
Basophils Absolute: 0.1 10*3/uL (ref 0.0–0.1)
Basophils Relative: 1 %
Eosinophils Absolute: 0.4 10*3/uL (ref 0.0–0.5)
Eosinophils Relative: 7 %
Immature Granulocytes: 0 %
Lymphocytes Relative: 37 %
Lymphs Abs: 2.1 10*3/uL (ref 0.7–4.0)
Monocytes Absolute: 0.6 10*3/uL (ref 0.1–1.0)
Monocytes Relative: 11 %
Neutro Abs: 2.6 10*3/uL (ref 1.7–7.7)
Neutrophils Relative %: 44 %

## 2021-08-02 LAB — PROTIME-INR
INR: 1.1 (ref 0.8–1.2)
Prothrombin Time: 13.8 seconds (ref 11.4–15.2)

## 2021-08-02 LAB — CBC
HCT: 42.5 % (ref 36.0–46.0)
Hemoglobin: 13.7 g/dL (ref 12.0–15.0)
MCH: 32.3 pg (ref 26.0–34.0)
MCHC: 32.2 g/dL (ref 30.0–36.0)
MCV: 100.2 fL — ABNORMAL HIGH (ref 80.0–100.0)
Platelets: 268 10*3/uL (ref 150–400)
RBC: 4.24 MIL/uL (ref 3.87–5.11)
RDW: 14.4 % (ref 11.5–15.5)
WBC: 5.7 10*3/uL (ref 4.0–10.5)
nRBC: 0 % (ref 0.0–0.2)

## 2021-08-02 LAB — RAPID URINE DRUG SCREEN, HOSP PERFORMED
Amphetamines: NOT DETECTED
Barbiturates: NOT DETECTED
Benzodiazepines: NOT DETECTED
Cocaine: NOT DETECTED
Opiates: NOT DETECTED
Tetrahydrocannabinol: NOT DETECTED

## 2021-08-02 LAB — RESP PANEL BY RT-PCR (FLU A&B, COVID) ARPGX2
Influenza A by PCR: NEGATIVE
Influenza B by PCR: NEGATIVE
SARS Coronavirus 2 by RT PCR: NEGATIVE

## 2021-08-02 LAB — APTT: aPTT: 31 seconds (ref 24–36)

## 2021-08-02 LAB — CBG MONITORING, ED: Glucose-Capillary: 116 mg/dL — ABNORMAL HIGH (ref 70–99)

## 2021-08-02 LAB — ETHANOL: Alcohol, Ethyl (B): <10 mg/dL (ref <10)

## 2021-08-02 MED ORDER — IOHEXOL 350 MG/ML SOLN
75.0000 mL | Freq: Once | INTRAVENOUS | Status: AC | PRN
  Administered 2021-08-02: 75 mL via INTRAVENOUS

## 2021-08-02 MED ORDER — SODIUM CHLORIDE 0.9 % IV SOLN
INTRAVENOUS | Status: AC
Start: 2021-08-02 — End: 2021-08-03

## 2021-08-02 MED ORDER — CLOPIDOGREL BISULFATE 75 MG PO TABS
75.0000 mg | ORAL_TABLET | Freq: Every day | ORAL | Status: DC
  Administered 2021-08-03: 75 mg via ORAL
  Filled 2021-08-02: qty 1

## 2021-08-02 MED ORDER — ASPIRIN 81 MG PO TBEC
81.0000 mg | DELAYED_RELEASE_TABLET | Freq: Every day | ORAL | Status: DC
  Administered 2021-08-03: 81 mg via ORAL
  Filled 2021-08-02: qty 1

## 2021-08-02 NOTE — ED Notes (Signed)
Mellette paged out @ [phone]

## 2021-08-02 NOTE — ED Notes (Signed)
Patient transported to CT 

## 2021-08-02 NOTE — ED Provider Notes (Signed)
[facility] EMERGENCY DEPARTMENT Provider Note   CSN: [id number] Arrival date & time: 08/02/21  1638  An emergency department physician performed an initial assessment on this suspected stroke patient at 23.  History  Chief Complaint  Patient presents with   Numbness    [name] is a 87 y.o. female.  Patient is an 87 year old female with past medical history of pulmonary embolism no longer on Eliquis due to microvascular hemorrhages, previous TIA presented with left-sided weakness, and complex seizures presenting for complaints of left-sided numbness.  Patient states that approximately 5:30 PM she began having feelings of weakness and sensation changes in her left upper extremity.  Denies any changes in speech, confusion, or lower extremity deficits.  Patient then called her daughter brought her to the emergency department.  No longer taking blood thinners.  No recent falls or head injuries.  States all symptoms are improving at this time.  The history is provided by the patient. No language interpreter was used.      Home Medications Prior to Admission medications   Medication Sig Start Date End Date Taking? Authorizing Provider  acetaminophen (TYLENOL) 500 MG tablet Take 1,000 mg by mouth every 8 (eight) hours as needed for mild pain or headache.    [provider]  amLODipine (NORVASC) 5 MG tablet Take 1 tablet (5 mg total) by mouth daily. 09/22/20   Lindell Spar, MD  ascorbic acid (VITAMIN C) 250 MG CHEW Chew 250 mg by mouth daily.    [provider]  ASPERCREME LIDOCAINE EX Apply 1 application topically daily as needed (for knee pain).     [provider]  aspirin 325 MG EC tablet Take 325 mg by mouth daily.    [provider]  atorvastatin (LIPITOR) 80 MG tablet TAKE 1 TABLET BY MOUTH ONCE A DAY. 05/10/21   Lindell Spar, MD  cholecalciferol (VITAMIN D3) 25 MCG (1000 UT) tablet Take 1,000 Units by mouth daily.     [provider]  diltiazem (CARDIZEM) 30 MG tablet Take 1 tablet (30 mg total) by mouth 2 (two) times daily. 05/07/21   Arnoldo Lenis, MD  gabapentin (NEURONTIN) 300 MG capsule Take 300 mg by mouth 3 (three) times daily.    [provider]  levETIRAcetam (KEPPRA) 500 MG tablet Take 250 mg by mouth 2 (two) times daily.    [provider]  LINZESS 72 MCG capsule TAKE (1) CAPSULE BY MOUTH DAILY AS NEEDED FOR CONSTIPATION. Patient taking differently: Take 72 mcg by mouth daily as needed (constipation). 10/26/20   Lindell Spar, MD  loratadine (CLARITIN) 10 MG tablet Take 10 mg by mouth daily.     [provider]  meclizine (ANTIVERT) 25 MG tablet Take 1 tablet (25 mg total) by mouth 3 (three) times daily as needed for dizziness. 11/03/20   Veryl Speak, MD  metoprolol tartrate (LOPRESSOR) 50 MG tablet Take 2 tablets (100 mg total) by mouth 2 (two) times daily. 04/07/21   Arnoldo Lenis, MD  RESTASIS 0.05 % ophthalmic emulsion Place 1 drop into both eyes 2 (two) times daily as needed (chronic dry eye). 05/17/10   [provider]  topiramate (TOPAMAX) 50 MG tablet Topamax 50 mg tablet  Take 1 tablet twice a day by oral route.    [provider]  traMADol (ULTRAM) 50 MG tablet Take 25 mg by mouth every 8 (eight) hours as needed for moderate pain. 01/10/19   [provider]  Allergies    Tape    Review of Systems   Review of Systems  Constitutional:  Negative for chills and fever.  HENT:  Negative for ear pain and sore throat.   Eyes:  Negative for pain and visual disturbance.  Respiratory:  Negative for cough and shortness of breath.   Cardiovascular:  Negative for chest pain and palpitations.  Gastrointestinal:  Negative for abdominal pain and vomiting.  Genitourinary:  Negative for dysuria and hematuria.  Musculoskeletal:  Negative for arthralgias and back pain.  Skin:  Negative for color change and rash.  Neurological:  Positive  for weakness and numbness. Negative for seizures and syncope.  All other systems reviewed and are negative.  Physical Exam Updated Vital Signs BP (!) 145/79   Pulse 72   Temp 98.8 F (37.1 C) (Oral)   Resp 14   Ht $R'5\' 4"'Lp$  (1.626 m)   Wt 72.8 kg   SpO2 98%   BMI 27.57 kg/m  Physical Exam Vitals and nursing note reviewed.  Constitutional:      General: She is not in acute distress.    Appearance: She is well-developed.  HENT:     Head: Normocephalic and atraumatic.  Eyes:     General: Lids are normal. Vision grossly intact.     Extraocular Movements: Extraocular movements intact.     Conjunctiva/sclera: Conjunctivae normal.     Pupils: Pupils are equal, round, and reactive to light.  Cardiovascular:     Rate and Rhythm: Normal rate and regular rhythm.     Heart sounds: No murmur heard. Pulmonary:     Effort: Pulmonary effort is normal. No respiratory distress.     Breath sounds: Normal breath sounds.  Abdominal:     Palpations: Abdomen is soft.     Tenderness: There is no abdominal tenderness.  Musculoskeletal:        General: No swelling.     Cervical back: Neck supple.  Skin:    General: Skin is warm and dry.     Capillary Refill: Capillary refill takes less than 2 seconds.  Neurological:     General: No focal deficit present.     Mental Status: She is alert and oriented to person, place, and time.     GCS: GCS eye subscore is 4. GCS verbal subscore is 5. GCS motor subscore is 6.     Cranial Nerves: Cranial nerves 2-12 are intact.     Sensory: Sensation is intact.     Motor: Motor function is intact.     Coordination: Coordination is intact.  Psychiatric:        Mood and Affect: Mood normal.    ED Results / Procedures / Treatments   Labs (all labs ordered are listed, but only abnormal results are displayed) Labs Reviewed  CBC - Abnormal; Notable for the following components:      Result Value   MCV 100.2 (*)    All other components within normal limits   COMPREHENSIVE METABOLIC PANEL - Abnormal; Notable for the following components:   Chloride 112 (*)    Glucose, Bld 107 (*)    BUN 26 (*)    Creatinine, Ser 1.61 (*)    GFR, Estimated 31 (*)    All other components within normal limits  I-STAT CHEM 8, ED - Abnormal; Notable for the following components:   BUN 26 (*)    Creatinine, Ser 1.60 (*)    Glucose, Bld 104 (*)    All other components within  normal limits  CBG MONITORING, ED - Abnormal; Notable for the following components:   Glucose-Capillary 116 (*)    All other components within normal limits  RESP PANEL BY RT-PCR (FLU A&B, COVID) ARPGX2  ETHANOL  DIFFERENTIAL  RAPID URINE DRUG SCREEN, HOSP PERFORMED  URINALYSIS, ROUTINE W REFLEX MICROSCOPIC  PROTIME-INR  APTT    EKG None  Radiology CT HEAD CODE STROKE WO CONTRAST  Result Date: 08/02/2021 CLINICAL DATA:  Code stroke. Provided history: Neuro deficit, acute, stroke suspected. Additional history provided by scanning technologist: Left-sided upper extremity weakness/numbness. EXAM: CT HEAD WITHOUT CONTRAST TECHNIQUE: Contiguous axial images were obtained from the base of the skull through the vertex without intravenous contrast. RADIATION DOSE REDUCTION: This exam was performed according to the departmental dose-optimization program which includes automated exposure control, adjustment of the mA and/or kV according to patient size and/or use of iterative reconstruction technique. COMPARISON:  Brain MRI 01/07/2021.  Head CT 01/06/2021. FINDINGS: Brain: Mild-to-moderate cerebral atrophy. Advanced patchy and ill-defined hypoattenuation within the cerebral white matter, nonspecific but compatible chronic small vessel ischemic disease. As before, chronic small vessel ischemic changes are also present within the bilateral basal ganglia, thalami and within the pons. There is no acute intracranial hemorrhage. No demarcated cortical infarct. No extra-axial fluid collection. No evidence of  an intracranial mass. No midline shift. Vascular: No hyperdense vessel. Atherosclerotic calcifications. Skull: No fracture or aggressive osseous lesion. Sinuses/Orbits: No mass or acute finding within the imaged orbits. No significant paranasal sinus disease at the imaged levels. ASPECTS [facility] Stroke Program Early CT Score) - Ganglionic level infarction (caudate, lentiform nuclei, internal capsule, insula, M1-M3 cortex): Seven - Supraganglionic infarction (M4-M6 cortex): 3 Total score (0-10 with 10 being normal): 10 No acute intracranial abnormality identified. These results were called by telephone at the time of interpretation on 08/02/2021 at 8:07 pm to provider Campbell Stall , who verbally acknowledged these results. IMPRESSION: No evidence of acute intracranial abnormality. Advanced chronic small vessel ischemic changes within the cerebral white matter. As before, chronic small vessel ischemic changes are also present within the bilateral basal ganglia, thalami and within the pons. Mild-to-moderate generalized cerebral atrophy. Electronically Signed   By: Kellie Simmering D.O.   On: 08/02/2021 20:08    Procedures .Critical Care Performed by: Lianne Cure, DO Authorized by: Lianne Cure, DO   Critical care provider statement:    Critical care time (minutes):  76   Critical care was necessary to treat or prevent imminent or life-threatening deterioration of the following conditions: TIA, neurological deficit, strokelike symptoms.   Critical care was time spent personally by me on the following activities:  Development of treatment plan with patient or surrogate, discussions with consultants, evaluation of patient's response to treatment, examination of patient, ordering and review of laboratory studies, ordering and review of radiographic studies, ordering and performing treatments and interventions, pulse oximetry, re-evaluation of patient's condition and review of old charts   Care discussed with:  admitting provider     Care discussed with comment:  Admitting physician and neurology    Medications Ordered in ED Medications - No data to display  ED Course/ Medical Decision Making/ A&P                           Medical Decision Making Amount and/or Complexity of Data Reviewed Labs: ordered. Radiology: ordered.   37:40 PM 87 year old female with past medical history of pulmonary embolism no longer on Eliquis due  to microvascular hemorrhages, previous TIA presented with left-sided weakness, and complex seizures presenting for complaints of left-sided numbness.  Onset of symptoms 5:30 PM NIH stroke scale 0 Neurology consulted and patient taken to CAT scan.  TPA No Patient will not receive TPA for the following reason(s): -Met exclusion criteria, history of microvascular hemorrhages.  Resolving symptoms.    Thorough discussion had with patient and/or family/guardian on risks/benefits of tPA and why patient is not a candidate for thrombolytic therapy at this time. tPA will not be given to this patient. ASA will be administered if there are no contraindications or allergies.  CT head demonstrates no acute process.  Patient recommended for admission for further work-up of TIA.  Neurology on.  I spoke with admitting physician Dr. Ermelinda Das except patient.  Agreeable to admission.        Final Clinical Impression(s) / ED Diagnoses Final diagnoses:  TIA (transient ischemic attack)  Left sided numbness    Rx / DC Orders ED Discharge Orders     None         Lianne Cure, DO 26/33/35 2041

## 2021-08-02 NOTE — ED Notes (Signed)
Pt returned from CT °

## 2021-08-02 NOTE — ED Triage Notes (Addendum)
Pov from home with daughter cc of sudden left side decreased sensation that started today at 530pm while watching TV.  States it has almost resolved now.  Strength intact. No droop noted.  Takes 325 aspirin daily.

## 2021-08-02 NOTE — Progress Notes (Signed)
1940-Code stroke activated 1949 Pt to [place] Tel[facility]ist paged [phone] Pt back from CT 1958 Dr Quinn Axe on camera

## 2021-08-02 NOTE — Consult Note (Signed)
NEUROLOGY TELECONSULTATION NOTE   Date of service: Aug 02, 2021 Patient Name: [name] MRN:  [id number] DOB:  04-05-1934 Reason for consult: stroke code  Requesting Provider: Dr. Campbell Stall Consult Participants: myself, patient, bedside RN, telestroke RN Location of the provider: [place], [place] Location of the patient: [facility]  This consult was provided via telemedicine with 2-way video and audio communication. The patient/family was informed that care would be provided in this way and agreed to receive care in this manner.   _ _ _   _ __   _ __ _ _  __ __   _ __   __ _  History of Present Illness   This is a 87 yo woman with pmhx HTN, HL, PE no longer on AC, hx microhemorrhages and small 12m acute hemorrhage on CT Sept 2020 in setting of anticoagulation with eliquis, complex partial seizures who presents after episode of numbness L arm and face, now resolved. Sx occurred at 1730 today (LKW). She has a prior hx of stroke presenting with L sided numbness in that distribution without residual numbness until the recurrence today. She also has hx ipsilateral thalamic pain syndrome. She has a hx complex partial seizures manifesting as LUE shaking f/b numbness with alteration of consciousness. She had no motor sx or alteration of consciousness today to suggest seizure. NIHSS = 2 for drift in both legs (nonfocal). CT head NAICP (personal review). TNK not administered 2/2 resolution of sx and hx recurrent microhemorrhages.   ROS   Per HPI; all other systems reviewed and are negative  Past History   The following was personally reviewed:  Past Medical History:  Diagnosis Date   Allergy    Arthritis    [place]    colon ca dx 07/30/09   History of cardiac monitoring 07/2017   "Event monitor demonstrated sinus rhythm with isolated PACs and no arrhythmias"   History of colon cancer 06/2009   found at time of TCS 06/29/09, 1.2cm sessile cecal polyp, no adjuvent therapy  needed   HTN (hypertension)    Hx of cardiovascular stress test 07/2017   "No diagnostic ST segment changes to indicate ischemia. Small, moderate intensity, reversible apical to basal inferolateral defect consistent with ischemia. This is a low risk study. Nuclear stress EF: 84%."   Hyperlipidemia    Hypothyroidism    [place]ism[place]    Renal disorder    cyst on kidney    [place]   [facility]    TIA (transient ischemic attack) 11/25/2018   Vertigo    Past Surgical History:  Procedure Laterality Date   ABDOMINAL HYSTERECTOMY     COLON SURGERY  07/2009   right hemicolectomy, no residual colon cancer on path   COLONOSCOPY  07/16/2010   SFOY:DXAJOINOMVEHPOLYP-TCS 3 YEARS   COLONOSCOPY N/A 08/02/2013   hyperplastic polyps, surveillance in 2020 if benefits outweight the risks   COLONOSCOPY  06/2009   1.2 cm sessile cecal polyp which had adenocarcinoma arising in a tubular adenoma.   PARTIAL THYMECTOMY     partial thyroidectomy     benign tumors   Family History  Problem Relation Age of Onset   Colon cancer Mother        >>age3  Arthritis Mother    Cancer Mother    Heart disease Mother    Hyperlipidemia Mother    Hypertension Mother    Heart attack Father    Heart  disease Father    Diabetes Maternal Aunt    Hyperlipidemia Daughter    Hypertension Daughter    Liver disease Neg Hx    Social History   Socioeconomic History   Marital status: Widowed    Spouse name: Not on file   Number of children: 2   Years of education: Not on file   Highest education level: Not on file  Occupational History   Occupation: Psychologist, 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   Occupation: retired from [employer]  Tobacco Use   Smoking status: Never   Smokeless tobacco: Never  Vaping Use   Vaping Use: Never used  Substance and Sexual Activity   Alcohol use: No    Alcohol/week: 0.0 standard drinks   Drug use: No   Sexual activity: Not  Currently  Other Topics Concern   Not on file  Social History Narrative   Not on file   Social Determinants of Health   Financial Resource Strain: Low Risk    Difficulty of Paying Living Expenses: Not very hard  Food Insecurity: No Food Insecurity   Worried About Charity fundraiser in the Last Year: Never true   [place] in the Last Year: Never true  Transportation Needs: No Transportation Needs   Lack of Transportation (Medical): No   Lack of Transportation (Non-Medical): No  Physical Activity: Inactive   Days of Exercise per Week: 0 days   Minutes of Exercise per Session: 0 min  Stress: No Stress Concern Present   Feeling of Stress : Only a little  Social Connections: Moderately Isolated   Frequency of Communication with Friends and Family: More than three times a week   Frequency of Social Gatherings with Friends and Family: More than three times a week   Attends Religious Services: More than 4 times per year   Active Member of [employer] or Organizations: No   Attends Archivist Meetings: Never   Marital Status: Widowed   Allergies  Allergen Reactions   Tape Rash    Zio monitor adhesive causes ulcerated and infected skin .Had to see derm    Medications   (Not in a hospital admission)    No current facility-administered medications for this encounter.  Current Outpatient Medications:    acetaminophen (TYLENOL) 500 MG tablet, Take 1,000 mg by mouth every 8 (eight) hours as needed for mild pain or headache., Disp: , Rfl:    amLODipine (NORVASC) 5 MG tablet, Take 1 tablet (5 mg total) by mouth daily., Disp: 90 tablet, Rfl: 3   ascorbic acid (VITAMIN C) 250 MG CHEW, Chew 250 mg by mouth daily., Disp: , Rfl:    ASPERCREME LIDOCAINE EX, Apply 1 application topically daily as needed (for knee pain). , Disp: , Rfl:    aspirin 325 MG EC tablet, Take 325 mg by mouth daily., Disp: , Rfl:    atorvastatin (LIPITOR) 80 MG tablet, TAKE 1 TABLET BY MOUTH ONCE A DAY.,  Disp: 90 tablet, Rfl: 0   cholecalciferol (VITAMIN D3) 25 MCG (1000 UT) tablet, Take 1,000 Units by mouth daily. , Disp: , Rfl:    diltiazem (CARDIZEM) 30 MG tablet, Take 1 tablet (30 mg total) by mouth 2 (two) times daily., Disp: 180 tablet, Rfl: 3   gabapentin (NEURONTIN) 300 MG capsule, Take 300 mg by mouth 3 (three) times daily., Disp: , Rfl:    levETIRAcetam (KEPPRA) 500 MG tablet, Take 250 mg by mouth 2 (two) times daily., Disp: , Rfl:  LINZESS 72 MCG capsule, TAKE (1) CAPSULE BY MOUTH DAILY AS NEEDED FOR CONSTIPATION. (Patient taking differently: Take 72 mcg by mouth daily as needed (constipation).), Disp: 90 capsule, Rfl: 3   loratadine (CLARITIN) 10 MG tablet, Take 10 mg by mouth daily. , Disp: , Rfl:    meclizine (ANTIVERT) 25 MG tablet, Take 1 tablet (25 mg total) by mouth 3 (three) times daily as needed for dizziness., Disp: 15 tablet, Rfl: 0   metoprolol tartrate (LOPRESSOR) 50 MG tablet, Take 2 tablets (100 mg total) by mouth 2 (two) times daily., Disp: 360 tablet, Rfl: 3   RESTASIS 0.05 % ophthalmic emulsion, Place 1 drop into both eyes 2 (two) times daily as needed (chronic dry eye)., Disp: , Rfl:    topiramate (TOPAMAX) 50 MG tablet, Topamax 50 mg tablet  Take 1 tablet twice a day by oral route., Disp: , Rfl:    traMADol (ULTRAM) 50 MG tablet, Take 25 mg by mouth every 8 (eight) hours as needed for moderate pain., Disp: , Rfl:   Vitals   Vitals:   08/02/21 1933 08/02/21 1935 08/02/21 2000  BP: (!) 153/135  (!) 142/98  Pulse: 66  70  Resp: 17  17  Temp: 98.8 F (37.1 C)    TempSrc: Oral    SpO2: 100%  99%  Weight:  72.8 kg   Height:  '5\' 4"'$  (1.626 m)      Body mass index is 27.57 kg/m.  Physical Exam   Exam performed over telemedicine with 2-way video and audio communication and with assistance of bedside RN  Physical Exam Gen: A&O x4, NAD Resp: normal WOB CV: extremities appear well-perfused  Neuro: *MS: A&O x4. Follows multi-step commands.  *Speech:  nondysarthric, no aphasia, able to name and repeat *CN: PERRL 36m, EOMI, VFF by confrontation, sensation intact, smile symmetric, hearing intact to voice *Motor:   Normal bulk.  No tremor, rigidity or bradykinesia. BUE no drift with apparent symmetric full strength. BLE drift, symmetric, but not to bed. *Sensory: SILT. Symmetric. No double-simultaneous extinction.  *Coordination:  Finger-to-nose, heel-to-shin, rapid alternating motions were intact. *Reflexes:  UTA 2/2 tele-exam *Gait: deferred  NIHSS = 2 (1 each for BLE drift)  Premorbid mRS = 2   Labs   CBC:  Recent Labs  Lab 08/02/21 1941 08/02/21 1948  WBC 5.7  --   NEUTROABS 2.6  --   HGB 13.7 15.0  HCT 42.5 44.0  MCV 100.2*  --   PLT 268  --     Basic Metabolic Panel:  Lab Results  Component Value Date   NA 145 08/02/2021   K 3.8 08/02/2021   CO2 23 02/08/2021   GLUCOSE 104 (H) 08/02/2021   BUN 26 (H) 08/02/2021   CREATININE 1.60 (H) 08/02/2021   CALCIUM 9.3 02/08/2021   GFRNONAA 43 (L) 02/08/2021   GFRAA 41 (L) 12/03/2019   Lipid Panel:  Lab Results  Component Value Date   LDLCALC 61 01/07/2021   HgbA1c:  Lab Results  Component Value Date   HGBA1C 5.8 07/26/2021   HGBA1C 5.8 07/26/2021   Urine Drug Screen:     Component Value Date/Time   LABOPIA NONE DETECTED 01/06/2021 1732   COCAINSCRNUR NONE DETECTED 01/06/2021 1732   LABBENZ NONE DETECTED 01/06/2021 1732   AMPHETMU NONE DETECTED 01/06/2021 1732   THCU NONE DETECTED 01/06/2021 1732   LABBARB NONE DETECTED 01/06/2021 1732    Alcohol Level     Component Value Date/Time   ETH <10 05/28/2020 1037  Impression   This is a 87 yo woman with pmhx HTN, HL, PE no longer on AC, hx microhemorrhages and small 3m acute hemorrhage on CT Sept 2020 in setting of anticoagulation with eliquis, complex partial seizures who presents after episode of numbness L arm and face, now resolved. Sx occurred at 1730 today (LKW). She has a prior hx of stroke  presenting with L sided numbness in that distribution without residual numbness until the recurrence today. She also has hx ipsilateral thalamic pain syndrome. She has a hx complex partial seizures manifesting as LUE shaking f/b numbness with alteration of consciousness. She had no motor sx or alteration of consciousness today to suggest seizure. NIHSS = 2 for drift in both legs (nonfocal). CT head NAICP (personal review). TNK not administered 2/2 resolution of sx and hx recurrent microhemorrhages.  Recommendations   - Admit to hospitalist service for stroke w/u - OK to admit to [facility] - Permissive HTN x48 hrs from sx onset or until stroke ruled out by MRI goal BP <220/110. PRN labetalol or hydralazine if BP above these parameters. Avoid oral antihypertensives. - MRI brain wo contrast - CTA or MRA H&N - TTE w/ bubble - Check A1c and LDL + add statin per guidelines - ASA '81mg'$  daily + plavix '75mg'$  daily x21 days f/b plavix '75mg'$  daily monotherapy after that - q4 hr neuro checks - STAT head CT for any change in neuro exam - Tele - PT/OT/SLP - Stroke education - Amb referral to neurology upon discharge  - Continue home AEDs at home doses - No indication for EEG unless c/f ongoing seizure activity; will not change mgmt  If patient requires additional neurology consultation this admission please consult teleneurologist Dr. YHortense Ramal(available weekdays 8am-4pm, pager number under neurology in [facility] ______________________________________________________________________   Thank you for the opportunity to take part in the care of this patient. If you have any further questions, please contact the neurology consultation attending.  Signed,  CSu Monks MD [facility] [phone] If 7pm- 7am, please page neurology on call as listed in [facility]

## 2021-08-02 NOTE — H&P (Addendum)
History and Physical    Patient: [name] DOB: 1935-01-26 DOA: 08/02/2021 DOS: the patient was seen and examined on 08/02/2021 PCP: Lindell Spar, MD  Patient coming from: Home  Chief Complaint:  Chief Complaint  Patient presents with   Numbness   HPI: [name] is a 87 y.o. female with medical history significant of hypertension, hyperlipidemia, TIA, pulmonary embolus (no longer on Eliquis due to microvascular hemorrhages), complex partial seizures who presents to the emergency department due to numbness of left side of face and weakness of left upper extremity which started around 5:30 PM while watching TV today.  Patient states that she called the daughter who came to her aid and took her to the ED for further evaluation and management.  She denies chest pain, shortness of breath, speech change, confusion left lower extremity weakness, recent fall or injuries.  Patient ambulates with a cane at baseline, she lives alone, but daughter checks on her regularly. Patient presented with similar presentation during admission from 02/28/2019 to 03/02/2019 during which she had sensory disturbance/left-sided weakness and stroke studies at that time was negative, patient was said to have complex partial seizures and started on Keppra at that time.    ED course: In the emergency department, she was hemodynamically stable.  BP was 142/98.  Work-up in the ED showed normal CBC, MCV 100.2, BMP was normal except for BUN/creatinine at 26/1.61 (baseline creatinine at 1.2-1.4), alcohol level was less than 10, urinalysis was normal except for having some Leukocytes, urine drug screen was negative. CT head without contrast showed no evidence of acute intracranial abnormality. Advanced chronic small vessel ischemic changes within the cerebral white matter. As before, chronic small vessel ischemic changes are also present within the bilateral basal ganglia, thalami and within  the pons. Mild-to-moderate generalized cerebral atrophy. Telemetry neurologist was consulted and recommended further stroke work-up.  Hospitalist was asked to admit patient for further evaluation and management.   Review of Systems: Review of systems as noted in the HPI. All other systems reviewed and are negative.  Past Medical History:  Diagnosis Date   Allergy    Arthritis    Colon cancer ([place])    colon ca dx 07/30/09   History of cardiac monitoring 07/2017   "Event monitor demonstrated sinus rhythm with isolated PACs and no arrhythmias"   History of colon cancer 06/2009   found at time of TCS 06/29/09, 1.2cm sessile cecal polyp, no adjuvent therapy needed   HTN (hypertension)    Hx of cardiovascular stress test 07/2017   "No diagnostic ST segment changes to indicate ischemia. Small, moderate intensity, reversible apical to basal inferolateral defect consistent with ischemia. This is a low risk study. Nuclear stress EF: 84%."   Hyperlipidemia    Hypothyroidism    PE (pulmonary thromboembolism) ([place])    Renal disorder    cyst on kidney    Silent micro-hemorrhage of brain ([place]) 11/25/2018   Stroke [facility])    TIA (transient ischemic attack) 11/25/2018   Vertigo    Past Surgical History:  Procedure Laterality Date   ABDOMINAL HYSTERECTOMY     COLON SURGERY  07/2009   right hemicolectomy, no residual colon cancer on path   COLONOSCOPY  07/16/2010   WIO:[id number] POLYP-TCS 3 YEARS   COLONOSCOPY N/A 08/02/2013   hyperplastic polyps, surveillance in 2020 if benefits outweight the risks   COLONOSCOPY  06/2009   1.2 cm sessile cecal polyp which had adenocarcinoma arising in a tubular adenoma.  PARTIAL THYMECTOMY     partial thyroidectomy     benign tumors    Social History:  reports that she has never smoked. She has never used smokeless tobacco. She reports that she does not drink alcohol and does not use drugs.   Allergies  Allergen Reactions   Tape Rash    Zio monitor  adhesive causes ulcerated and infected skin .Had to see derm    Family History  Problem Relation Age of Onset   Colon cancer Mother        >age33   Arthritis Mother    Cancer Mother    Heart disease Mother    Hyperlipidemia Mother    Hypertension Mother    Heart attack Father    Heart disease Father    Diabetes Maternal Aunt    Hyperlipidemia Daughter    Hypertension Daughter    Liver disease Neg Hx      Prior to Admission medications   Medication Sig Start Date End Date Taking? Authorizing Provider  acetaminophen (TYLENOL) 500 MG tablet Take 1,000 mg by mouth every 8 (eight) hours as needed for mild pain or headache.    [provider]  amLODipine (NORVASC) 5 MG tablet Take 1 tablet (5 mg total) by mouth daily. 09/22/20   Lindell Spar, MD  ascorbic acid (VITAMIN C) 250 MG CHEW Chew 250 mg by mouth daily.    [provider]  ASPERCREME LIDOCAINE EX Apply 1 application topically daily as needed (for knee pain).     [provider]  aspirin 325 MG EC tablet Take 325 mg by mouth daily.    [provider]  atorvastatin (LIPITOR) 80 MG tablet TAKE 1 TABLET BY MOUTH ONCE A DAY. 05/10/21   Lindell Spar, MD  cholecalciferol (VITAMIN D3) 25 MCG (1000 UT) tablet Take 1,000 Units by mouth daily.     [provider]  diltiazem (CARDIZEM) 30 MG tablet Take 1 tablet (30 mg total) by mouth 2 (two) times daily. 05/07/21   Arnoldo Lenis, MD  gabapentin (NEURONTIN) 300 MG capsule Take 300 mg by mouth 3 (three) times daily.    [provider]  levETIRAcetam (KEPPRA) 500 MG tablet Take 250 mg by mouth 2 (two) times daily.    [provider]  LINZESS 72 MCG capsule TAKE (1) CAPSULE BY MOUTH DAILY AS NEEDED FOR CONSTIPATION. Patient taking differently: Take 72 mcg by mouth daily as needed (constipation). 10/26/20   Lindell Spar, MD  loratadine (CLARITIN) 10 MG tablet Take 10 mg by mouth daily.     [provider]   meclizine (ANTIVERT) 25 MG tablet Take 1 tablet (25 mg total) by mouth 3 (three) times daily as needed for dizziness. 11/03/20   Veryl Speak, MD  metoprolol tartrate (LOPRESSOR) 50 MG tablet Take 2 tablets (100 mg total) by mouth 2 (two) times daily. 04/07/21   Arnoldo Lenis, MD  RESTASIS 0.05 % ophthalmic emulsion Place 1 drop into both eyes 2 (two) times daily as needed (chronic dry eye). 05/17/10   [provider]  topiramate (TOPAMAX) 50 MG tablet Topamax 50 mg tablet  Take 1 tablet twice a day by oral route.    [provider]  traMADol (ULTRAM) 50 MG tablet Take 25 mg by mouth every 8 (eight) hours as needed for moderate pain. 01/10/19   [provider]    Physical Exam: BP 134/74   Pulse 66   Temp 98 F (36.7 C) (Oral)  Resp 17   Ht '5\' 4"'$  (1.626 m)   Wt 72.8 kg   SpO2 100%   BMI 27.57 kg/m   General: 87 y.o. year-old female well developed well nourished in no acute distress.  Alert and oriented x3. HEENT: NCAT, EOMI, dry mucous membrane. Neck: Supple, trachea medial Cardiovascular: Regular rate and rhythm with no rubs or gallops.  No thyromegaly or JVD noted.  No lower extremity edema. 2/4 pulses in all 4 extremities. Respiratory: Clear to auscultation with no wheezes or rales. Good inspiratory effort. Abdomen: Soft, nontender nondistended with normal bowel sounds x4 quadrants. Muskuloskeletal: No cyanosis, clubbing or edema noted bilaterally Neuro: CN II-XII intact, strength 5/5 x 4, sensation, reflexes intact.  NIHSS 0 Skin: No ulcerative lesions noted or rashes Psychiatry: Judgement and insight appear normal. Mood is appropriate for condition and setting          Labs on Admission:  Basic Metabolic Panel: Recent Labs  Lab 08/02/21 1941 08/02/21 1948  NA 143 145  K 3.7 3.8  CL 112* 109  CO2 25  --   GLUCOSE 107* 104*  BUN 26* 26*  CREATININE 1.61* 1.60*  CALCIUM 9.3  --    Liver Function Tests: Recent Labs  Lab 08/02/21 1941   AST 20  ALT 20  ALKPHOS 98  BILITOT 0.3  PROT 7.6  ALBUMIN 4.2   No results for input(s): LIPASE, AMYLASE in the last 168 hours. No results for input(s): AMMONIA in the last 168 hours. CBC: Recent Labs  Lab 08/02/21 1941 08/02/21 1948  WBC 5.7  --   NEUTROABS 2.6  --   HGB 13.7 15.0  HCT 42.5 44.0  MCV 100.2*  --   PLT 268  --    Cardiac Enzymes: No results for input(s): CKTOTAL, CKMB, CKMBINDEX, TROPONINI in the last 168 hours.  BNP (last 3 results) No results for input(s): BNP in the last 8760 hours.  ProBNP (last 3 results) No results for input(s): PROBNP in the last 8760 hours.  CBG: Recent Labs  Lab 08/02/21 1943  GLUCAP 116*    Radiological Exams on Admission: CT HEAD CODE STROKE WO CONTRAST  Result Date: 08/02/2021 CLINICAL DATA:  Code stroke. Provided history: Neuro deficit, acute, stroke suspected. Additional history provided by scanning technologist: Left-sided upper extremity weakness/numbness. EXAM: CT HEAD WITHOUT CONTRAST TECHNIQUE: Contiguous axial images were obtained from the base of the skull through the vertex without intravenous contrast. RADIATION DOSE REDUCTION: This exam was performed according to the departmental dose-optimization program which includes automated exposure control, adjustment of the mA and/or kV according to patient size and/or use of iterative reconstruction technique. COMPARISON:  Brain MRI 01/07/2021.  Head CT 01/06/2021. FINDINGS: Brain: Mild-to-moderate cerebral atrophy. Advanced patchy and ill-defined hypoattenuation within the cerebral white matter, nonspecific but compatible chronic small vessel ischemic disease. As before, chronic small vessel ischemic changes are also present within the bilateral basal ganglia, thalami and within the pons. There is no acute intracranial hemorrhage. No demarcated cortical infarct. No extra-axial fluid collection. No evidence of an intracranial mass. No midline shift. Vascular: No hyperdense  vessel. Atherosclerotic calcifications. Skull: No fracture or aggressive osseous lesion. Sinuses/Orbits: No mass or acute finding within the imaged orbits. No significant paranasal sinus disease at the imaged levels. ASPECTS [facility] Stroke Program Early CT Score) - Ganglionic level infarction (caudate, lentiform nuclei, internal capsule, insula, M1-M3 cortex): Seven - Supraganglionic infarction (M4-M6 cortex): 3 Total score (0-10 with 10 being normal): 10 No acute intracranial abnormality identified. These  results were called by telephone at the time of interpretation on 08/02/2021 at 8:07 pm to provider Campbell Stall , who verbally acknowledged these results. IMPRESSION: No evidence of acute intracranial abnormality. Advanced chronic small vessel ischemic changes within the cerebral white matter. As before, chronic small vessel ischemic changes are also present within the bilateral basal ganglia, thalami and within the pons. Mild-to-moderate generalized cerebral atrophy. Electronically Signed   By: Kellie Simmering D.O.   On: 08/02/2021 20:08    EKG: I independently viewed the EKG done and my findings are as followed: Normal sinus rhythm at a rate of 69 bpm  Assessment/Plan Present on Admission:  Left sided numbness  TIA (transient ischemic attack)  Essential hypertension  Complex partial seizure ([place])  Personal history of pulmonary embolism  Principal Problem:   Left sided numbness Active Problems:   Essential hypertension   Personal history of pulmonary embolism   Mixed hyperlipidemia   TIA (transient ischemic attack)   Complex partial seizure ([facility])   Dehydration  Left-sided facial sensory disturbance/left hand weakness possibly due to TIA CT head without contrast showed no evidence of acute intracranial abnormality. Patient will be admitted to telemetry unit  CT angiography of head and neck recommended by teleneurologist Echocardiogram with bubble in the morning as recommended by the  teleneurologist MRI of brain without contrast in the morning Continue aspirin 81 mg p.o. daily and Plavix 75 mg daily for 21 days; followed by Plavix 75 mg daily monotherapy Continue fall precautions and neuro checks Lipid panel and hemoglobin A1c will be checked Continue PT/SLP/OT eval and treat Bedside swallow eval by nursing prior to diet Teleneurology consult in the ED was appreciated Continue inpatient tele neurology consult in the morning  Dehydration Continue IV hydration  Essential hypertension  Antihypertensives PRN if Blood pressure is greater than 220/120 or there is a concern for End organ damage/contraindications for permissive HTN. If blood pressure is greater than 220/120 give labetalol PO or IV or Vasotec IV with a goal of 15% reduction in BP during the first 24 hours.  Mixed hyperlipidemia Continue Lipitor  History of pulmonary embolus (no longer on Eliquis) Stable  Complex partial seizures Continue Neurontin, Keppra and Topamax   DVT prophylaxis: SCDs  Code Status: DNR (confirmed by patient and daughter at bedside)  Consults: Telemetry Neurology  Family Communication: Daughter at bedside (all questions answered to satisfaction)  Severity of Illness: The appropriate patient status for this patient is OBSERVATION. Observation status is judged to be reasonable and necessary in order to provide the required intensity of service to ensure the patient's safety. The patient's presenting symptoms, physical exam findings, and initial radiographic and laboratory data in the context of their medical condition is felt to place them at decreased risk for further clinical deterioration. Furthermore, it is anticipated that the patient will be medically stable for discharge from the hospital within 2 midnights of admission.   Author: Bernadette Hoit, DO 08/02/2021 10:25 PM  For on call review [email or web address].

## 2021-08-03 ENCOUNTER — Observation Stay (HOSPITAL_COMMUNITY): Payer: Medicare Other

## 2021-08-03 ENCOUNTER — Observation Stay (HOSPITAL_BASED_OUTPATIENT_CLINIC_OR_DEPARTMENT_OTHER): Payer: Medicare Other

## 2021-08-03 DIAGNOSIS — E782 Mixed hyperlipidemia: Secondary | ICD-10-CM | POA: Diagnosis not present

## 2021-08-03 DIAGNOSIS — R209 Unspecified disturbances of skin sensation: Secondary | ICD-10-CM

## 2021-08-03 DIAGNOSIS — I10 Essential (primary) hypertension: Secondary | ICD-10-CM | POA: Diagnosis not present

## 2021-08-03 DIAGNOSIS — G319 Degenerative disease of nervous system, unspecified: Secondary | ICD-10-CM | POA: Diagnosis not present

## 2021-08-03 DIAGNOSIS — N1832 Chronic kidney disease, stage 3b: Secondary | ICD-10-CM

## 2021-08-03 DIAGNOSIS — G459 Transient cerebral ischemic attack, unspecified: Secondary | ICD-10-CM | POA: Diagnosis not present

## 2021-08-03 DIAGNOSIS — G9389 Other specified disorders of brain: Secondary | ICD-10-CM | POA: Diagnosis not present

## 2021-08-03 DIAGNOSIS — Z8679 Personal history of other diseases of the circulatory system: Secondary | ICD-10-CM | POA: Diagnosis not present

## 2021-08-03 DIAGNOSIS — R29818 Other symptoms and signs involving the nervous system: Secondary | ICD-10-CM | POA: Diagnosis not present

## 2021-08-03 DIAGNOSIS — R200 Anesthesia of skin: Secondary | ICD-10-CM | POA: Diagnosis not present

## 2021-08-03 DIAGNOSIS — R7302 Impaired glucose tolerance (oral): Secondary | ICD-10-CM

## 2021-08-03 DIAGNOSIS — R8281 Pyuria: Secondary | ICD-10-CM

## 2021-08-03 LAB — ECHOCARDIOGRAM COMPLETE
AR max vel: 2.62 cm2
AV Area VTI: 2.52 cm2
AV Area mean vel: 2.43 cm2
AV Mean grad: 3 mmHg
AV Peak grad: 5.6 mmHg
Ao pk vel: 1.18 m/s
Area-P 1/2: 2.6 cm2
Height: 63 in
MV VTI: 3.18 cm2
S' Lateral: 1.9 cm
Weight: 2543.23 oz

## 2021-08-03 LAB — BASIC METABOLIC PANEL
Anion gap: 5 (ref 5–15)
BUN: 22 mg/dL (ref 8–23)
CO2: 25 mmol/L (ref 22–32)
Calcium: 9.1 mg/dL (ref 8.9–10.3)
Chloride: 113 mmol/L — ABNORMAL HIGH (ref 98–111)
Creatinine, Ser: 1.19 mg/dL — ABNORMAL HIGH (ref 0.44–1.00)
GFR, Estimated: 45 mL/min — ABNORMAL LOW (ref >60)
Glucose, Bld: 100 mg/dL — ABNORMAL HIGH (ref 70–99)
Potassium: 3.8 mmol/L (ref 3.5–5.1)
Sodium: 143 mmol/L (ref 135–145)

## 2021-08-03 LAB — LIPID PANEL
Cholesterol: 144 mg/dL (ref 0–200)
HDL: 62 mg/dL (ref >40)
LDL Cholesterol: 76 mg/dL (ref 0–99)
Total CHOL/HDL Ratio: 2.3 RATIO
Triglycerides: 28 mg/dL (ref <150)
VLDL: 6 mg/dL (ref 0–40)

## 2021-08-03 LAB — PHOSPHORUS: Phosphorus: 3 mg/dL (ref 2.5–4.6)

## 2021-08-03 LAB — HEMOGLOBIN A1C
Hgb A1c MFr Bld: 6.1 % — ABNORMAL HIGH (ref 4.8–5.6)
Mean Plasma Glucose: 128.37 mg/dL

## 2021-08-03 LAB — MAGNESIUM: Magnesium: 2.1 mg/dL (ref 1.7–2.4)

## 2021-08-03 MED ORDER — LEVETIRACETAM 250 MG PO TABS
250.0000 mg | ORAL_TABLET | Freq: Two times a day (BID) | ORAL | Status: DC
  Administered 2021-08-03 (×2): 250 mg via ORAL
  Filled 2021-08-03 (×2): qty 1

## 2021-08-03 MED ORDER — TOPIRAMATE 25 MG PO TABS
50.0000 mg | ORAL_TABLET | Freq: Every day | ORAL | Status: DC
  Administered 2021-08-03: 50 mg via ORAL
  Filled 2021-08-03: qty 2

## 2021-08-03 MED ORDER — ASPIRIN 81 MG PO TBEC: 81.0000 mg | DELAYED_RELEASE_TABLET | Freq: Every day | ORAL | 0 refills | Status: DC

## 2021-08-03 MED ORDER — CEFDINIR 300 MG PO CAPS: 300.0000 mg | ORAL_CAPSULE | Freq: Two times a day (BID) | ORAL | 0 refills | Status: DC

## 2021-08-03 MED ORDER — CEFDINIR 300 MG PO CAPS
300.0000 mg | ORAL_CAPSULE | Freq: Two times a day (BID) | ORAL | Status: DC
  Administered 2021-08-03: 300 mg via ORAL
  Filled 2021-08-03: qty 1

## 2021-08-03 MED ORDER — STROKE: EARLY STAGES OF RECOVERY BOOK: Freq: Once | Status: AC

## 2021-08-03 MED ORDER — ACETAMINOPHEN 650 MG RE SUPP: 650.0000 mg | RECTAL | Status: DC | PRN

## 2021-08-03 MED ORDER — GABAPENTIN 300 MG PO CAPS
300.0000 mg | ORAL_CAPSULE | Freq: Three times a day (TID) | ORAL | Status: DC
  Administered 2021-08-03 (×2): 300 mg via ORAL
  Filled 2021-08-03 (×2): qty 1

## 2021-08-03 MED ORDER — ATORVASTATIN CALCIUM 40 MG PO TABS
80.0000 mg | ORAL_TABLET | Freq: Every day | ORAL | Status: DC
  Administered 2021-08-03: 80 mg via ORAL
  Filled 2021-08-03: qty 2

## 2021-08-03 MED ORDER — CLOPIDOGREL BISULFATE 75 MG PO TABS: 75.0000 mg | ORAL_TABLET | Freq: Every day | ORAL | 2 refills | Status: DC

## 2021-08-03 MED ORDER — ACETAMINOPHEN 160 MG/5ML PO SOLN: 650.0000 mg | ORAL | Status: DC | PRN

## 2021-08-03 MED ORDER — ACETAMINOPHEN 325 MG PO TABS: 650.0000 mg | ORAL_TABLET | ORAL | Status: DC | PRN

## 2021-08-03 NOTE — Assessment & Plan Note (Signed)
Baseline creatinine 1.2-1.4

## 2021-08-03 NOTE — Discharge Summary (Addendum)
Physician Discharge Summary   Patient: [name] MRN: [id number] DOB: Jul 20, 1934  Admit date:     08/02/2021  Discharge date: 08/03/21  Discharge Physician: Shanon Brow Octivia Canion   PCP: Lindell Spar, MD   Recommendations at discharge:   Please follow up with primary care provider within 1-2 weeks  Please repeat BMP and CBC in one week     Hospital Course: 87 year old female with a history of pulmonary embolism no longer on apixaban (hx microhemorrhages and small 66m acute hemorrhage [facility] CT [facility], complex [facility], TIA, hypertension, hyperlipidemia, colon cancer, hypothyroidism presenting with left facial numbness and left upper extremity weakness that began around 5:30 PM on 08/02/2021.  The patient was sitting in a recliner reading the Bible when this occurred.  She contacted her daughter who subsequently brought her to the emergency department for further evaluation.  By the time she arrived in the emergency department, the patient states that her symptoms were improving.  She denies any loss of consciousness or any alteration of consciousness.  She denied any tonic clonic type movements of her extremities.  She denies any lower extremity weakness.  There is no visual disturbance or dysarthria. Patient denies fevers, chills, headache, chest pain, dyspnea, nausea, vomiting, diarrhea, abdominal pain, dysuria, hematuria, hematochezia, and melena.  The patient denies any new changes in any of her medications. Notably, the patient had a hospital admission from 02/28/2019 to 03/02/2019 with similar symptoms.  At that time MRI of the brain was negative.  She was given a diagnosis of complex partial seizures and started on Keppra.  In the ED, the patient was afebrile and hemodynamically stable with oxygen saturation 100% room air.  BMP showed sodium 143, potassium 3.7, bicarbonate 25, serum creatinine 1.61.  WBC 5.7, hemoglobin 13.7, platelets 260,000.  CT of the brain  was negative for acute findings.  EKG shows sinus rhythm with nonspecific ST changes.  CTA head and neck was negative for LVO.  There was moderate stenosis in the right supraclinoid ICA with mild stenosis in the left supraclinoid ICA.  No hemodynamically significant stenosis in the neck.  UA showed 21-50 WBC.     Assessment and Plan: * Sensory disturbance -Appreciate Neurology Consult>>felt it was TIA vs recrudescence from previous stroke symptoms in setting of dehydration -PT/OT evaluation>>HHPT -Speech therapy eval -CT brain--negative -MRI brain--neg -CTA H&N--No intracranial large vessel occlusion. Redemonstrated moderate stenosis in the right supraclinoid ICA and mild stenosis in the left supraclinoid ICA -Echo--results pending at time of d/c -LDL--76 -07/26/2021 HbA1C--5.8 -Antiplatelet--ASA 81& plavix 75 mg x 21 days, then plavix thereafter  Complex partial seizure (HCC) Continue Keppra and Topamax  Chronic kidney disease, stage 3b (HCC) Baseline creatinine 1.2-1.4  Pyuria UA 21-50WBC Culture was never sent D/c home with 3 days cefdinir  Impaired glucose tolerance 07/26/2021 hemoglobin A1c 5.8  Mixed hyperlipidemia Continue atorvastatin 80 mg daily LDL 76  Essential hypertension Holding amlodipine, diltiazem, metoprolol tartrate to allow for permissive hypertension>>restart after d/c  Unprovoked Pulmonary embolus 04/26/18 -  followed by intracranial hemorrhage on eliquis which was stopped and she's now on ASA 325 mg daily.       Consultants: neurology Procedures performed: none  Disposition: Home Diet recommendation:  Cardiac diet DISCHARGE MEDICATION: Allergies as of 08/03/2021       Reactions   Tape Rash   Zio monitor adhesive causes ulcerated and infected skin .Had to see derm        Medication List  TAKE these medications    acetaminophen 500 MG tablet Commonly known as: TYLENOL Take 1,000 mg by mouth every 8 (eight) hours as needed for  mild pain or headache.   amLODipine 5 MG tablet Commonly known as: NORVASC Take 1 tablet (5 mg total) by mouth daily.   ascorbic acid 250 MG Chew Commonly known as: VITAMIN C Chew 250 mg by mouth daily.   ASPERCREME LIDOCAINE EX Apply 1 application topically daily as needed (for knee pain).   aspirin EC 81 MG tablet Take 1 tablet (81 mg total) by mouth daily. X 20 days Start taking on: Aug 04, 2021 What changed:  medication strength how much to take additional instructions   atorvastatin 80 MG tablet Commonly known as: LIPITOR TAKE 1 TABLET BY MOUTH ONCE A DAY.   cefdinir 300 MG capsule Commonly known as: OMNICEF Take 1 capsule (300 mg total) by mouth every 12 (twelve) hours.   cholecalciferol 25 MCG (1000 UNIT) tablet Commonly known as: VITAMIN D3 Take 1,000 Units by mouth daily.   clopidogrel 75 MG tablet Commonly known as: PLAVIX Take 1 tablet (75 mg total) by mouth daily. Start taking on: Aug 04, 2021   diltiazem 30 MG tablet Commonly known as: CARDIZEM Take 1 tablet (30 mg total) by mouth 2 (two) times daily.   gabapentin 300 MG capsule Commonly known as: NEURONTIN Take 300 mg by mouth 3 (three) times daily.   levETIRAcetam 500 MG tablet Commonly known as: KEPPRA Take 250 mg by mouth 2 (two) times daily.   Linzess 72 MCG capsule Generic drug: linaclotide TAKE (1) CAPSULE BY MOUTH DAILY AS NEEDED FOR CONSTIPATION. What changed: See the new instructions.   loratadine 10 MG tablet Commonly known as: CLARITIN Take 10 mg by mouth daily.   meclizine 25 MG tablet Commonly known as: ANTIVERT Take 1 tablet (25 mg total) by mouth 3 (three) times daily as needed for dizziness.   metoprolol tartrate 50 MG tablet Commonly known as: LOPRESSOR Take 2 tablets (100 mg total) by mouth 2 (two) times daily.   Restasis 0.05 % ophthalmic emulsion Generic drug: cycloSPORINE Place 1 drop into both eyes 2 (two) times daily as needed (chronic dry eye).   topiramate  50 MG tablet Commonly known as: TOPAMAX Take 50 mg by mouth 2 (two) times daily.   traMADol 50 MG tablet Commonly known as: ULTRAM Take 25 mg by mouth every 8 (eight) hours as needed for moderate pain.        Follow-up Information     Phillips Odor, MD. Schedule an appointment as soon as possible for a visit in 1 month(s).   Specialty: Neurology Contact information: [address] [place] [address] [phone]         [facility]-Home Follow up.   Specialty: Home Health Services Why: [facility] staff will call you to schedule in home visits               Discharge Exam: Filed Weights   08/02/21 1935 08/02/21 2248  Weight: 72.8 kg 72.1 kg   HEENT:  [place]/AT, No thrush, no icterus CV:  RRR, no rub, no S3, no S4 Lung:  CTA, no wheeze, no rhonchi Abd:  soft/+BS, NT Ext:  No edema, no lymphangitis, no synovitis, no rash   Condition at discharge: stable  The results of significant diagnostics from this hospitalization (including imaging, microbiology, ancillary and laboratory) are listed below for reference.   Imaging Studies: MR BRAIN WO CONTRAST  Result Date: 08/03/2021 CLINICAL DATA:  Neuro deficit, acute, stroke suspected. Left arm and face numbness yesterday. EXAM: MRI HEAD WITHOUT CONTRAST TECHNIQUE: Multiplanar, multiecho pulse sequences of the brain and surrounding structures were obtained without intravenous contrast. COMPARISON:  CT studies done yesterday.  MRI 01/07/2021 FINDINGS: Brain: Diffusion imaging does not show any acute or subacute infarction. There are chronic small-vessel ischemic changes affecting the pons. Few old small vessel cerebellar infarctions. Cerebral hemispheres show generalized atrophy with relative frontal predominance. There are extensive chronic small-vessel ischemic changes of the thalami, basal ganglia and hemispheric white matter. No evidence of large vessel territory stroke. No mass, acute hemorrhage, struck of  hydrocephalus or extra-axial collection. There are punctate foci of hemosiderin deposition associated with some of the old small vessel infarctions, particularly in the thalami. There is relative ex vacuo enlargement of the frontal horns of the lateral ventricles. Vascular: Major vessels at the base of the brain show flow. Skull and upper cervical spine: Negative Sinuses/Orbits: Clear/normal Other: None IMPRESSION: No acute finding.  No change since the study of 01/07/2021. Extensive chronic small-vessel ischemic changes throughout the brain, with punctate foci of chronic hemosiderin deposition. Brain atrophy/volume loss, frontal lobe predominant. Electronically Signed   By: Nelson Chimes M.D.   On: 08/03/2021 08:49   CT HEAD CODE STROKE WO CONTRAST  Result Date: 08/02/2021 CLINICAL DATA:  Code stroke. Provided history: Neuro deficit, acute, stroke suspected. Additional history provided by scanning technologist: Left-sided upper extremity weakness/numbness. EXAM: CT HEAD WITHOUT CONTRAST TECHNIQUE: Contiguous axial images were obtained from the base of the skull through the vertex without intravenous contrast. RADIATION DOSE REDUCTION: This exam was performed according to the departmental dose-optimization program which includes automated exposure control, adjustment of the mA and/or kV according to patient size and/or use of iterative reconstruction technique. COMPARISON:  Brain MRI 01/07/2021.  Head CT 01/06/2021. FINDINGS: Brain: Mild-to-moderate cerebral atrophy. Advanced patchy and ill-defined hypoattenuation within the cerebral white matter, nonspecific but compatible chronic small vessel ischemic disease. As before, chronic small vessel ischemic changes are also present within the bilateral basal ganglia, thalami and within the pons. There is no acute intracranial hemorrhage. No demarcated cortical infarct. No extra-axial fluid collection. No evidence of an intracranial mass. No midline shift. Vascular: No  hyperdense vessel. Atherosclerotic calcifications. Skull: No fracture or aggressive osseous lesion. Sinuses/Orbits: No mass or acute finding within the imaged orbits. No significant paranasal sinus disease at the imaged levels. ASPECTS [facility] Stroke Program Early CT Score) - Ganglionic level infarction (caudate, lentiform nuclei, internal capsule, insula, M1-M3 cortex): Seven - Supraganglionic infarction (M4-M6 cortex): 3 Total score (0-10 with 10 being normal): 10 No acute intracranial abnormality identified. These results were called by telephone at the time of interpretation on 08/02/2021 at 8:07 pm to provider Campbell Stall , who verbally acknowledged these results. IMPRESSION: No evidence of acute intracranial abnormality. Advanced chronic small vessel ischemic changes within the cerebral white matter. As before, chronic small vessel ischemic changes are also present within the bilateral basal ganglia, thalami and within the pons. Mild-to-moderate generalized cerebral atrophy. Electronically Signed   By: Kellie Simmering D.O.   On: 08/02/2021 20:08   CT ANGIO HEAD NECK W WO CM (CODE STROKE)  Result Date: 08/02/2021 CLINICAL DATA:  Left arm and face numbness, which has resolved EXAM: CT ANGIOGRAPHY HEAD AND NECK TECHNIQUE: Multidetector CT imaging of the head and neck was performed using the standard protocol during bolus administration of intravenous contrast. Multiplanar CT image reconstructions and MIPs were obtained  to evaluate the vascular anatomy. Carotid stenosis measurements (when applicable) are obtained utilizing NASCET criteria, using the distal internal carotid diameter as the denominator. RADIATION DOSE REDUCTION: This exam was performed according to the departmental dose-optimization program which includes automated exposure control, adjustment of the mA and/or kV according to patient size and/or use of iterative reconstruction technique. CONTRAST:  30m OMNIPAQUE IOHEXOL 350 MG/ML SOLN COMPARISON:   No prior CTA, correlation is made with MRA 11/26/2018 and CT head 08/02/2021 FINDINGS: CT HEAD FINDINGS For noncontrast findings, please see same day CT head. CTA NECK FINDINGS Aortic arch: Standard branching. Imaged portion shows no evidence of aneurysm or dissection. No significant stenosis of the major arch vessel origins. Right carotid system: No evidence of dissection, occlusion, or hemodynamically significant stenosis (greater than 50%). Left carotid system: No evidence of dissection, occlusion, or hemodynamically significant stenosis (greater than 50%). Vertebral arteries: No evidence of dissection, occlusion, or hemodynamically significant stenosis (greater than 50%). Skeleton: No acute osseous abnormality. Trace retrolisthesis of C3 on C4 and 3 mm anterolisthesis of C4 on C5, which appears degenerative, with fused right C4-C5 facets. Other neck: Redemonstrated enlargement of the left thyroid, which has previously been evaluated with ultrasound biopsy. No follow-up is recommended. (Reference: J Am Coll Radiol. 2015 Feb;12(2): 143-50) otherwise negative. Upper chest: No focal pulmonary opacity or pleural effusion. Review of the MIP images confirms the above findings CTA HEAD FINDINGS Anterior circulation: Both internal carotid arteries are patent to the termini, with calcifications in the distal cavernous and supraclinoid segments, causing up to moderate stenosis on the right and mild stenosis on the left in the supraclinoid segments. A1 segments patent. Normal anterior communicating artery. Anterior cerebral arteries are patent to their distal aspects. No M1 stenosis or occlusion. Normal MCA bifurcations. Distal MCA branches perfused and symmetric. Posterior circulation: Vertebral arteries patent to the vertebrobasilar junction without stenosis. Posterior inferior cerebral arteries patent bilaterally. Basilar patent to its distal aspect. Superior cerebellar arteries patent bilaterally. Patent P1 segments.  PCAs perfused to their distal aspects without stenosis. The bilateral posterior communicating arteries are at that patent. Venous sinuses: As permitted by contrast timing, patent. Anatomic variants: None significant. Review of the MIP images confirms the above findings IMPRESSION: 1. No intracranial large vessel occlusion. Redemonstrated moderate stenosis in the right supraclinoid ICA and mild stenosis in the left supraclinoid ICA. 2.  No hemodynamically significant stenosis in the neck. Electronically Signed   By: AMerilyn BabaM.D.   On: 08/02/2021 22:51    Microbiology: Results for orders placed or performed during the hospital encounter of 08/02/21  Resp Panel by RT-PCR (Flu A&B, Covid) Nasopharyngeal Swab     Status: None   Collection Time: 08/02/21  8:25 PM   Specimen: Nasopharyngeal Swab; Nasopharyngeal(NP) swabs in vial transport medium  Result Value Ref Range Status   SARS Coronavirus 2 by RT PCR NEGATIVE NEGATIVE Final    Comment: (NOTE) SARS-CoV-2 target nucleic acids are NOT DETECTED.  The SARS-CoV-2 RNA is generally detectable in upper respiratory specimens during the acute phase of infection. The lowest concentration of SARS-CoV-2 viral copies this assay can detect is 138 copies/mL. A negative result does not preclude SARS-Cov-2 infection and should not be used as the sole basis for treatment or other patient management decisions. A negative result may occur with  improper specimen collection/handling, submission of specimen other than nasopharyngeal swab, presence of viral mutation(s) within the areas targeted by this assay, and inadequate number of viral copies(<138 copies/mL). A negative result must  be combined with clinical observations, patient history, and epidemiological information. The expected result is Negative.  Fact Sheet for Patients:  EntrepreneurPulse.com.au  Fact Sheet for Healthcare Providers:   IncredibleEmployment.be  This test is no t yet approved or cleared by the [place] FDA and  has been authorized for detection and/or diagnosis of SARS-CoV-2 by FDA under an Emergency Use Authorization (EUA). This EUA will remain  in effect (meaning this test can be used) for the duration of the COVID-19 declaration under Section 564(b)(1) of the Act, 21 U.S.C.section 360bbb-3(b)(1), unless the authorization is terminated  or revoked sooner.       Influenza A by PCR NEGATIVE NEGATIVE Final   Influenza B by PCR NEGATIVE NEGATIVE Final    Comment: (NOTE) The Xpert Xpress SARS-CoV-2/FLU/RSV plus assay is intended as an aid in the diagnosis of influenza from Nasopharyngeal swab specimens and should not be used as a sole basis for treatment. Nasal washings and aspirates are unacceptable for Xpert Xpress SARS-CoV-2/FLU/RSV testing.  Fact Sheet for Patients: EntrepreneurPulse.com.au  Fact Sheet for Healthcare Providers: IncredibleEmployment.be  This test is not yet approved or cleared by the [place] FDA and has been authorized for detection and/or diagnosis of SARS-CoV-2 by FDA under an Emergency Use Authorization (EUA). This EUA will remain in effect (meaning this test can be used) for the duration of the COVID-19 declaration under Section 564(b)(1) of the Act, 21 U.S.C. section 360bbb-3(b)(1), unless the authorization is terminated or revoked.  Performed at [facility], [address], [place], [place] [address]     Labs: CBC: Recent Labs  Lab 08/02/21 1941 08/02/21 1948  WBC 5.7  --   NEUTROABS 2.6  --   HGB 13.7 15.0  HCT 42.5 44.0  MCV 100.2*  --   PLT 268  --    Basic Metabolic Panel: Recent Labs  Lab 08/02/21 1941 08/02/21 1948 08/03/21 0549  NA 143 145 143  K 3.7 3.8 3.8  CL 112* 109 113*  CO2 25  --  25  GLUCOSE 107* 104* 100*  BUN 26* 26* 22  CREATININE 1.61* 1.60* 1.19*  CALCIUM 9.3   --  9.1  MG  --   --  2.1  PHOS  --   --  3.0   Liver Function Tests: Recent Labs  Lab 08/02/21 1941  AST 20  ALT 20  ALKPHOS 98  BILITOT 0.3  PROT 7.6  ALBUMIN 4.2   CBG: Recent Labs  Lab 08/02/21 1943  GLUCAP 116*    Discharge time spent: greater than 30 minutes.  Signed: Orson Eva, MD [facility] 08/03/2021

## 2021-08-03 NOTE — Progress Notes (Signed)
TELESTROKE TEAM PROGRESS NOTE   SUBJECTIVE (INTERVAL HISTORY) Her RN is at the bedside.  Patient seen in chair, stated that yesterday she failed left arm and the mouth numbness, no weakness.  Currently symptom resolved, however she does not know how long it had been lasted.  MRI done this morning showed no acute abnormality.   OBJECTIVE Temp:  [97.9 F (36.6 C)-98.8 F (37.1 C)] 98 F (36.7 C) (05/23 0513) Pulse Rate:  [62-72] 63 (05/23 0513) Cardiac Rhythm: Normal sinus rhythm (05/23 0754) Resp:  [14-22] 19 (05/23 0513) BP: (134-166)/(73-135) 154/73 (05/23 0513) SpO2:  [98 %-100 %] 100 % (05/23 0513) Weight:  [72.1 kg-72.8 kg] 72.1 kg (05/22 2248)  Recent Labs  Lab 08/02/21 1943  GLUCAP 116*   Recent Labs  Lab 08/02/21 1941 08/02/21 1948 08/03/21 0549  NA 143 145 143  K 3.7 3.8 3.8  CL 112* 109 113*  CO2 25  --  25  GLUCOSE 107* 104* 100*  BUN 26* 26* 22  CREATININE 1.61* 1.60* 1.19*  CALCIUM 9.3  --  9.1  MG  --   --  2.1  PHOS  --   --  3.0   Recent Labs  Lab 08/02/21 1941  AST 20  ALT 20  ALKPHOS 98  BILITOT 0.3  PROT 7.6  ALBUMIN 4.2   Recent Labs  Lab 08/02/21 1941 08/02/21 1948  WBC 5.7  --   NEUTROABS 2.6  --   HGB 13.7 15.0  HCT 42.5 44.0  MCV 100.2*  --   PLT 268  --    No results for input(s): CKTOTAL, CKMB, CKMBINDEX, TROPONINI in the last 168 hours. Recent Labs    08/02/21 2025  LABPROT 13.8  INR 1.1   Recent Labs    08/02/21 2044  COLORURINE STRAW*  LABSPEC 1.008  PHURINE 7.0  GLUCOSEU NEGATIVE  HGBUR NEGATIVE  BILIRUBINUR NEGATIVE  KETONESUR NEGATIVE  PROTEINUR NEGATIVE  NITRITE NEGATIVE  LEUKOCYTESUR LARGE*       Component Value Date/Time   CHOL 144 08/03/2021 0549   CHOL 150 12/08/2020 0956   TRIG 28 08/03/2021 0549   HDL 62 08/03/2021 0549   HDL 59 12/08/2020 0956   CHOLHDL 2.3 08/03/2021 0549   VLDL 6 08/03/2021 0549   LDLCALC 76 08/03/2021 0549   LDLCALC 76 12/08/2020 0956   LDLCALC 65 09/20/2019 1605    Lab Results  Component Value Date   HGBA1C 6.1 (H) 08/03/2021      Component Value Date/Time   LABOPIA NONE DETECTED 08/02/2021 2044   COCAINSCRNUR NONE DETECTED 08/02/2021 2044   LABBENZ NONE DETECTED 08/02/2021 2044   AMPHETMU NONE DETECTED 08/02/2021 2044   THCU NONE DETECTED 08/02/2021 2044   LABBARB NONE DETECTED 08/02/2021 2044    Recent Labs  Lab 08/02/21 1941  ETH <10    I have personally reviewed the radiological images below and agree with the radiology interpretations.  MR BRAIN WO CONTRAST  Result Date: 08/03/2021 CLINICAL DATA:  Neuro deficit, acute, stroke suspected. Left arm and face numbness yesterday. EXAM: MRI HEAD WITHOUT CONTRAST TECHNIQUE: Multiplanar, multiecho pulse sequences of the brain and surrounding structures were obtained without intravenous contrast. COMPARISON:  CT studies done yesterday.  MRI 01/07/2021 FINDINGS: Brain: Diffusion imaging does not show any acute or subacute infarction. There are chronic small-vessel ischemic changes affecting the pons. Few old small vessel cerebellar infarctions. Cerebral hemispheres show generalized atrophy with relative frontal predominance. There are extensive chronic small-vessel ischemic changes of the thalami, basal  ganglia and hemispheric white matter. No evidence of large vessel territory stroke. No mass, acute hemorrhage, struck of hydrocephalus or extra-axial collection. There are punctate foci of hemosiderin deposition associated with some of the old small vessel infarctions, particularly in the thalami. There is relative ex vacuo enlargement of the frontal horns of the lateral ventricles. Vascular: Major vessels at the base of the brain show flow. Skull and upper cervical spine: Negative Sinuses/Orbits: Clear/normal Other: None IMPRESSION: No acute finding.  No change since the study of 01/07/2021. Extensive chronic small-vessel ischemic changes throughout the brain, with punctate foci of chronic hemosiderin  deposition. Brain atrophy/volume loss, frontal lobe predominant. Electronically Signed   By: Nelson Chimes M.D.   On: 08/03/2021 08:49   CT HEAD CODE STROKE WO CONTRAST  Result Date: 08/02/2021 CLINICAL DATA:  Code stroke. Provided history: Neuro deficit, acute, stroke suspected. Additional history provided by scanning technologist: Left-sided upper extremity weakness/numbness. EXAM: CT HEAD WITHOUT CONTRAST TECHNIQUE: Contiguous axial images were obtained from the base of the skull through the vertex without intravenous contrast. RADIATION DOSE REDUCTION: This exam was performed according to the departmental dose-optimization program which includes automated exposure control, adjustment of the mA and/or kV according to patient size and/or use of iterative reconstruction technique. COMPARISON:  Brain MRI 01/07/2021.  Head CT 01/06/2021. FINDINGS: Brain: Mild-to-moderate cerebral atrophy. Advanced patchy and ill-defined hypoattenuation within the cerebral white matter, nonspecific but compatible chronic small vessel ischemic disease. As before, chronic small vessel ischemic changes are also present within the bilateral basal ganglia, thalami and within the pons. There is no acute intracranial hemorrhage. No demarcated cortical infarct. No extra-axial fluid collection. No evidence of an intracranial mass. No midline shift. Vascular: No hyperdense vessel. Atherosclerotic calcifications. Skull: No fracture or aggressive osseous lesion. Sinuses/Orbits: No mass or acute finding within the imaged orbits. No significant paranasal sinus disease at the imaged levels. ASPECTS [facility] Stroke Program Early CT Score) - Ganglionic level infarction (caudate, lentiform nuclei, internal capsule, insula, M1-M3 cortex): Seven - Supraganglionic infarction (M4-M6 cortex): 3 Total score (0-10 with 10 being normal): 10 No acute intracranial abnormality identified. These results were called by telephone at the time of interpretation on  08/02/2021 at 8:07 pm to provider Campbell Stall , who verbally acknowledged these results. IMPRESSION: No evidence of acute intracranial abnormality. Advanced chronic small vessel ischemic changes within the cerebral white matter. As before, chronic small vessel ischemic changes are also present within the bilateral basal ganglia, thalami and within the pons. Mild-to-moderate generalized cerebral atrophy. Electronically Signed   By: Kellie Simmering D.O.   On: 08/02/2021 20:08   CT ANGIO HEAD NECK W WO CM (CODE STROKE)  Result Date: 08/02/2021 CLINICAL DATA:  Left arm and face numbness, which has resolved EXAM: CT ANGIOGRAPHY HEAD AND NECK TECHNIQUE: Multidetector CT imaging of the head and neck was performed using the standard protocol during bolus administration of intravenous contrast. Multiplanar CT image reconstructions and MIPs were obtained to evaluate the vascular anatomy. Carotid stenosis measurements (when applicable) are obtained utilizing NASCET criteria, using the distal internal carotid diameter as the denominator. RADIATION DOSE REDUCTION: This exam was performed according to the departmental dose-optimization program which includes automated exposure control, adjustment of the mA and/or kV according to patient size and/or use of iterative reconstruction technique. CONTRAST:  18m OMNIPAQUE IOHEXOL 350 MG/ML SOLN COMPARISON:  No prior CTA, correlation is made with MRA 11/26/2018 and CT head 08/02/2021 FINDINGS: CT HEAD FINDINGS For noncontrast findings, please see same day CT head.  CTA NECK FINDINGS Aortic arch: Standard branching. Imaged portion shows no evidence of aneurysm or dissection. No significant stenosis of the major arch vessel origins. Right carotid system: No evidence of dissection, occlusion, or hemodynamically significant stenosis (greater than 50%). Left carotid system: No evidence of dissection, occlusion, or hemodynamically significant stenosis (greater than 50%). Vertebral arteries: No  evidence of dissection, occlusion, or hemodynamically significant stenosis (greater than 50%). Skeleton: No acute osseous abnormality. Trace retrolisthesis of C3 on C4 and 3 mm anterolisthesis of C4 on C5, which appears degenerative, with fused right C4-C5 facets. Other neck: Redemonstrated enlargement of the left thyroid, which has previously been evaluated with ultrasound biopsy. No follow-up is recommended. (Reference: J Am Coll Radiol. 2015 Feb;12(2): 143-50) otherwise negative. Upper chest: No focal pulmonary opacity or pleural effusion. Review of the MIP images confirms the above findings CTA HEAD FINDINGS Anterior circulation: Both internal carotid arteries are patent to the termini, with calcifications in the distal cavernous and supraclinoid segments, causing up to moderate stenosis on the right and mild stenosis on the left in the supraclinoid segments. A1 segments patent. Normal anterior communicating artery. Anterior cerebral arteries are patent to their distal aspects. No M1 stenosis or occlusion. Normal MCA bifurcations. Distal MCA branches perfused and symmetric. Posterior circulation: Vertebral arteries patent to the vertebrobasilar junction without stenosis. Posterior inferior cerebral arteries patent bilaterally. Basilar patent to its distal aspect. Superior cerebellar arteries patent bilaterally. Patent P1 segments. PCAs perfused to their distal aspects without stenosis. The bilateral posterior communicating arteries are at that patent. Venous sinuses: As permitted by contrast timing, patent. Anatomic variants: None significant. Review of the MIP images confirms the above findings IMPRESSION: 1. No intracranial large vessel occlusion. Redemonstrated moderate stenosis in the right supraclinoid ICA and mild stenosis in the left supraclinoid ICA. 2.  No hemodynamically significant stenosis in the neck. Electronically Signed   By: Merilyn Baba M.D.   On: 08/02/2021 22:51     PHYSICAL EXAM  Temp:   [97.9 F (36.6 C)-98.8 F (37.1 C)] 98 F (36.7 C) (05/23 0513) Pulse Rate:  [62-72] 63 (05/23 0513) Resp:  [14-22] 19 (05/23 0513) BP: (134-166)/(73-135) 154/73 (05/23 0513) SpO2:  [98 %-100 %] 100 % (05/23 0513) Weight:  [72.1 kg-72.8 kg] 72.1 kg (05/22 2248)  General - Well nourished, well developed, in no apparent distress.  Ophthalmologic - fundi not visualized due to noncooperation.  Cardiovascular - Regular rhythm and rate.  Mental Status -  Level of arousal and orientation to time, place, and age were intact. Language including expression, naming, repetition, comprehension was assessed and found intact.  Mild dysarthria and psychomotor slowing.  Cranial Nerves II - XII - II - Visual field intact OU. III, IV, VI - Extraocular movements intact. V - Facial sensation subjectively decreased on the left. VII - Facial movement intact bilaterally. VIII - Hearing & vestibular intact bilaterally. X - Palate elevates symmetrically. XI - Chin turning & shoulder shrug intact bilaterally. XII - Tongue protrusion intact.  Motor Strength - The patient's strength was symmetrical in all extremities and pronator drift was absent.  Bulk was normal and fasciculations were absent.   Motor Tone - Muscle tone was assessed at the neck and appendages and was normal.  Sensory - Light touch, temperature/pinprick were assessed and were symmetrical.    Coordination - The patient had normal movements in the hands with no ataxia or dysmetria.  Tremor was absent.  Gait and Station - deferred.   ASSESSMENT/PLAN [name]  is a 87 y.o. female with history of hypertension, hyperlipidemia, PE in 04/2018 now off AC, ?  Complex partial seizure on Keppra, colon cancer, small right frontal ICH 11/2018 admitted for transient left arm and mouth numbness. No tPA given due to symptom resolved.    TIA vs. recrudescence from previous stroke symptoms in the setting of AKI CT no acute  abnormality CT head and neck moderate right siphon and mild left siphon stenosis MRI no acute abnormality. Extensive chronic small-vessel ischemic changes throughout the brain, with punctate foci of chronic hemosiderin deposition. 2D Echo pending, EF 65 to 70% in 12/2020 LDL 76 HgbA1c 6.1 SCDs for VTE prophylaxis aspirin 325 mg daily prior to admission, now on aspirin 81 mg daily and clopidogrel 75 mg daily DAPT for 3 weeks and then Plavix alone. Patient counseled to be compliant with her antithrombotic medications Ongoing aggressive stroke risk factor management Therapy recommendations: Home health Disposition: Likely home today  History of ICH 11/2018, patient admitted for dizziness, generalized weakness, not responding for 30 seconds but no LOC.  CT small 4 mm right frontal ICH.  MRI confirmed small right frontal ICH but also showed hypertensive microangiopathy.  MRI showed right ICA siphon moderate stenosis.  Right V4 mild to moderate stenosis.  EEG negative.  Eliquis discontinued.  Keppra added in concern of possible complex partial seizure. 02/2019 admitted for left side weakness and numbness with headache.  CT and MRI negative.  Discharged on aspirin 325.  Hypertension Stable Long term BP goal normotensive  Hyperlipidemia Home meds: Lipitor 80 LDL 76, goal < 70 Now on Lipitor 80.  No further escalation due to advanced age and LDL near goal Continue statin at discharge  Other Stroke Risk Factors Advanced age History of PE in 04/2018, put on Eliquis.  Eliquis discontinued in 11/2018 for small ICH.  Other Active Problems Colon cancer in remission, follows with oncology  Hospital day # 0  Neurology will sign off. Please call with questions. Pt will follow up with Dr. Merlene Laughter in about 4 weeks. Thanks for the consult.   Nicole Hawking, [name] Stroke Neurology 08/03/2021 11:35 AM    To contact Stroke Continuity provider, please refer to [email or web address] After hours, contact General  Neurology

## 2021-08-03 NOTE — Assessment & Plan Note (Signed)
07/26/2021 hemoglobin A1c 5.8

## 2021-08-03 NOTE — Assessment & Plan Note (Addendum)
Continue Keppra and Topamax

## 2021-08-03 NOTE — Hospital Course (Addendum)
87 year old female with a history of pulmonary embolism no longer on apixaban (hx microhemorrhages and small 66m acute hemorrhage on CT [facility], complex partial seizures, TIA, hypertension, hyperlipidemia, colon cancer, hypothyroidism presenting with left facial numbness and left upper extremity weakness that began around 5:30 PM on 08/02/2021.  The patient was sitting in a recliner reading the Bible when this occurred.  She contacted her daughter who subsequently brought her to the emergency department for further evaluation.  By the time she arrived in the emergency department, the patient states that her symptoms were improving.  She denies any loss of consciousness or any alteration of consciousness.  She denied any tonic clonic type movements of her extremities.  She denies any lower extremity weakness.  There is no visual disturbance or dysarthria. Patient denies fevers, chills, headache, chest pain, dyspnea, nausea, vomiting, diarrhea, abdominal pain, dysuria, hematuria, hematochezia, and melena.  The patient denies any new changes in any of her medications. Notably, the patient had a hospital admission from 02/28/2019 to 03/02/2019 with similar symptoms.  At that time MRI of the brain was negative.  She was given a diagnosis of complex partial seizures and started on Keppra.  In the ED, the patient was afebrile and hemodynamically stable with oxygen saturation 100% room air.  BMP showed sodium 143, potassium 3.7, bicarbonate 25, serum creatinine 1.61.  WBC 5.7, hemoglobin 13.7, platelets 260,000.  CT of the brain was negative for acute findings.  EKG shows sinus rhythm with nonspecific ST changes.  CTA head and neck was negative for LVO.  There was moderate stenosis in the right supraclinoid ICA with mild stenosis in the left supraclinoid ICA.  No hemodynamically significant stenosis in the neck.  UA showed 21-50 WBC.

## 2021-08-03 NOTE — Assessment & Plan Note (Addendum)
UA 21-50WBC Culture was never sent D/c home with 3 days cefdinir

## 2021-08-03 NOTE — Evaluation (Signed)
Occupational Therapy Evaluation Patient Details Name: [name] MRN: [id number] DOB: 1934/06/20 Today's Date: 08/03/2021   History of Present Illness [name] is a 87 y.o. female with medical history significant of hypertension, hyperlipidemia, TIA, pulmonary embolus (no longer on Eliquis due to microvascular hemorrhages), complex partial seizures who presents to the emergency department due to numbness of left side of face and weakness of left upper extremity which started around 5:30 PM while watching TV today.  Patient states that she called the daughter who came to her aid and took her to the ED for further evaluation and management.  She denies chest pain, shortness of breath, speech change, confusion left lower extremity weakness, recent fall or injuries.  Patient ambulates with a cane at baseline, she lives alone, but daughter checks on her regularly.  Patient presented with similar presentation during admission from 02/28/2019 to 03/02/2019 during which she had sensory disturbance/left-sided weakness and stroke studies at that time was negative, patient was said to have complex partial seizures and started on Keppra at that time. (per MD)   Clinical Impression   Pt agreeable to OT and PT co-evaluation. Pt reports no change in history from previous admission. Pt able to ambulate in room and hallway with mild labored movement using hurrycane. Pt demonstrates mild weakness which is expected at pt's age. Pt was able to demonstrates lower body dressing and grooming at the sink without modified independence. Pt is not recommended for further acute OT service and will be discharged to care of nursing staff for remaining length of stay.      Recommendations for follow up therapy are one component of a multi-disciplinary discharge planning process, led by the attending physician.  Recommendations may be updated based on patient status, additional functional criteria and  insurance authorization.   Follow Up Recommendations  No OT follow up    Assistance Recommended at Discharge PRN  Patient can return home with the following      Functional Status Assessment  Patient has not had a recent decline in their functional status  Equipment Recommendations  None recommended by OT    Recommendations for Other Services       Precautions / Restrictions Precautions Precautions: Fall Restrictions Weight Bearing Restrictions: No      Mobility Bed Mobility Overal bed mobility: Independent                  Transfers Overall transfer level: Modified independent Equipment used:  (hurrycane)               General transfer comment: Mild labored movement with use of hurrycane.      Balance Overall balance assessment: Needs assistance Sitting-balance support: No upper extremity supported, Feet supported Sitting balance-Leahy Scale: Good Sitting balance - Comments: seated EOB   Standing balance support: Single extremity supported, During functional activity Standing balance-Leahy Scale: Fair Standing balance comment: using hurrycane fair to good                           ADL either performed or assessed with clinical judgement   ADL Overall ADL's : Modified independent                                       General ADL Comments: Pt able to doff and don socks seated at EOB and tranfer to toilet using  cane. Pt also completed grooming standing at sink with mod I.     Vision Baseline Vision/History: 1 Wears glasses Ability to See in Adequate Light: 1 Impaired Patient Visual Report: No change from baseline Vision Assessment?: No apparent visual deficits                Pertinent Vitals/Pain Pain Assessment Pain Assessment: No/denies pain     Hand Dominance Right   Extremity/Trunk Assessment Upper Extremity Assessment Upper Extremity Assessment: Generalized weakness   Lower Extremity  Assessment Lower Extremity Assessment: Defer to PT evaluation   Cervical / Trunk Assessment Cervical / Trunk Assessment: Normal   Communication Communication Communication: No difficulties   Cognition Arousal/Alertness: Awake/alert Behavior During Therapy: WFL for tasks assessed/performed Overall Cognitive Status: Within Functional Limits for tasks assessed                                                        Home Living Family/patient expects to be discharged to:: Private residence Living Arrangements: Alone Available Help at Discharge: Family;Available PRN/intermittently Type of Home: House Home Access: Stairs to enter [employer] of Steps: 1 Entrance Stairs-Rails: Right;Left;Can reach both Home Layout: One level     Bathroom Shower/Tub: Teacher, early years/pre: Standard Bathroom Accessibility: No   Home Equipment: Conservation officer, nature (2 wheels);Other (comment);BSC/3in1;Grab bars - tub/shower;Shower seat (hurrycane cane)   Additional Comments: Daughter availalbe PRN.      Prior Functioning/Environment Prior Level of Function : Needs assist       Physical Assist : Mobility (physical);ADLs (physical) Mobility (physical): Transfers ADLs (physical): IADLs Mobility Comments: Indepenent household ambulation with cane at most times. RW used PRN. Pt reports often using RW first thing in the morning. ADLs Comments: Pt reports independence with ADL's. Daugter assits with IADL's of grocery shopping and cleaning.                      OT Goals(Current goals can be found in the care plan section) Acute Rehab OT Goals Patient Stated Goal: return home  OT Frequency:      Co-evaluation PT/OT/SLP Co-Evaluation/Treatment: Yes Reason for Co-Treatment: To address functional/ADL transfers   OT goals addressed during session: ADL's and self-care      AM-PAC OT "6 Clicks" Daily Activity     Outcome Measure Help from another person  eating meals?: None Help from another person taking care of personal grooming?: None Help from another person toileting, which includes using toliet, bedpan, or urinal?: None Help from another person bathing (including washing, rinsing, drying)?: None Help from another person to put on and taking off regular upper body clothing?: None Help from another person to put on and taking off regular lower body clothing?: None 6 Click Score: 24   End of Session Equipment Utilized During Treatment: Other (comment) (hurrycane)  Activity Tolerance: Patient tolerated treatment well Patient left: in chair;with call bell/phone within reach  OT Visit Diagnosis: Muscle weakness (generalized) (M62.81);Other symptoms and signs involving the nervous system (T41.962)                Time: 2297-9892 OT Time Calculation (min): 26 min Charges:  OT General Charges $OT Visit: 1 Visit OT Evaluation $OT Eval Low Complexity: 1 Low  Kellee Sittner OT, MOT  Larey Seat 08/03/2021, 10:00 AM

## 2021-08-03 NOTE — Plan of Care (Signed)
  Problem: Education: Goal: Knowledge of General Education information will improve Description: Including pain rating scale, medication(s)/side effects and non-pharmacologic comfort measures Outcome: Adequate for Discharge   Problem: Health Behavior/Discharge Planning: Goal: Ability to manage health-related needs will improve Outcome: Adequate for Discharge   Problem: Clinical Measurements: Goal: Ability to maintain clinical measurements within normal limits will improve Outcome: Adequate for Discharge Goal: Will remain free from infection Outcome: Adequate for Discharge Goal: Diagnostic test results will improve Outcome: Adequate for Discharge Goal: Respiratory complications will improve Outcome: Adequate for Discharge Goal: Cardiovascular complication will be avoided Outcome: Adequate for Discharge   Problem: Activity: Goal: Risk for activity intolerance will decrease Outcome: Adequate for Discharge   Problem: Nutrition: Goal: Adequate nutrition will be maintained Outcome: Adequate for Discharge   Problem: Coping: Goal: Level of anxiety will decrease Outcome: Adequate for Discharge   Problem: Education: Goal: Knowledge of disease or condition will improve Outcome: Adequate for Discharge Goal: Knowledge of secondary prevention will improve (SELECT ALL) Outcome: Adequate for Discharge Goal: Knowledge of patient specific risk factors will improve (INDIVIDUALIZE FOR PATIENT) Outcome: Adequate for Discharge Goal: Individualized Educational Video(s) Outcome: Adequate for Discharge   Problem: Coping: Goal: Will verbalize positive feelings about self Outcome: Adequate for Discharge Goal: Will identify appropriate support needs Outcome: Adequate for Discharge

## 2021-08-03 NOTE — TOC Initial Note (Signed)
Transition of Care [facility]) - Initial/Assessment Note    Patient Details  Name: [name] MRN: [id number] Date of Birth: [birth year]  Transition of Care [facility]) CM/SW Contact:    Shade Flood, LCSW Phone Number: 08/03/2021, 12:38 PM  Clinical Narrative:                  Pt observation status from home. Per MD, if stroke workup negative, pt will likely dc home later today. PT recommending HH PT at dc. Met with pt today to assess. Per pt, she lives alone but her daughter is in and out and assists when needed. Pt has a cane and walker for DME at home. Reviewed PT recommendation for HH at dc and pt is agreeable. CMS provider options reviewed. Pt requested referral to [facility] stating she has used them in the past. Pt does not feel that she will have any other TOC needs for dc.  Referral given to [facility] who accepted and stated they will follow pt at home.  Expected Discharge Plan: [place] Barriers to Discharge: Continued Medical Work up   Patient Goals and CMS Choice Patient states their goals for this hospitalization and ongoing recovery are:: go home CMS Medicare.gov Compare Post Acute Care list provided to:: Patient Choice offered to / list presented to : Patient  Expected Discharge Plan and Services Expected Discharge Plan: [place] In-house Referral: Clinical Social Work   Post Acute Care Choice: [place] arrangements for the past 2 months: [place]: RN, PT [place]: [place] ([facility]) Date [place]: 08/03/21   Representative spoke with at [place]: Vaughan Basta  Prior Living Arrangements/Services Living arrangements for the past 2 months: [place] with:: Self Patient language and need for interpreter reviewed:: Yes Do you feel safe going back to the place where you live?: Yes      Need for Family Participation in  Patient Care: No (Comment) Care giver support system in place?: Yes (comment) Current home services: DME Criminal Activity/Legal Involvement Pertinent to Current Situation/Hospitalization: No - Comment as needed  Activities of Daily Living Home Assistive Devices/Equipment: Cane (specify quad or straight) (tripod cane) ADL Screening (condition at time of admission) Patient's cognitive ability adequate to safely complete daily activities?: Yes Is the patient deaf or have difficulty hearing?: No Does the patient have difficulty seeing, even when wearing glasses/contacts?: No Does the patient have difficulty concentrating, remembering, or making decisions?: No Patient able to express need for assistance with ADLs?: Yes Does the patient have difficulty dressing or bathing?: No Independently performs ADLs?: Yes (appropriate for developmental age) Does the patient have difficulty walking or climbing stairs?: No Weakness of Legs: None Weakness of Arms/Hands: None  Permission Sought/Granted Permission sought to share information with : Chartered certified accountant granted to share information with : Yes, Verbal Permission Granted     Permission granted to share info w AGENCY: HH        Emotional Assessment Appearance:: Appears younger than stated age Attitude/Demeanor/Rapport: Engaged Affect (typically observed): Pleasant Orientation: : Oriented to Self, Oriented to Place, Oriented to  Time, Oriented to Situation Alcohol / Substance Use: Not Applicable Psych Involvement: No (comment)  Admission diagnosis:  TIA (transient ischemic attack) [G45.9] Left sided numbness [R20.0] Patient  Active Problem List   Diagnosis Date Noted   Sensory disturbance 08/03/2021   Impaired glucose tolerance 08/03/2021   Chronic left shoulder pain 07/23/2021   Hypokalemia 01/08/2021   TIA (transient ischemic attack) 01/06/2021   Encounter for general adult medical examination with abnormal  findings 12/15/2020   Leg swelling 11/09/2020   Headache 08/12/2020   Prediabetes 03/13/2019   Complex partial seizure ([place]) 03/02/2019   Left hemiparesis ([place]) 03/01/2019   PVC (premature ventricular contraction) 11/16/2018   DDD (degenerative disc disease), cervical 08/16/2018   Mixed hyperlipidemia 08/16/2018   Insomnia 12/15/2015   Essential hypertension 11/02/2015   Hypothyroidism 11/02/2015   Chronic kidney disease, stage 3b ([place]) 11/02/2015   OA (osteoarthritis) of knee 11/02/2015   Colon cancer ([place]) 02/28/2011   History of colon cancer 06/29/2010   Constipation 06/10/2009   PCP:  Lindell Spar, MD Pharmacy:   [facility], [address] [place] [address] Phone: [phone] Fax: [phone]     Social Determinants of Health (SDOH) Interventions    Readmission Risk Interventions     View : No data to display.

## 2021-08-03 NOTE — Plan of Care (Signed)
  Problem: Education: Goal: Knowledge of General Education information will improve Description: Including pain rating scale, medication(s)/side effects and non-pharmacologic comfort measures Outcome: Progressing   Problem: Health Behavior/Discharge Planning: Goal: Ability to manage health-related needs will improve Outcome: Progressing   Problem: Clinical Measurements: Goal: Ability to maintain clinical measurements within normal limits will improve Outcome: Progressing Goal: Will remain free from infection Outcome: Progressing Goal: Diagnostic test results will improve Outcome: Progressing Goal: Respiratory complications will improve Outcome: Progressing Goal: Cardiovascular complication will be avoided Outcome: Progressing   Problem: Activity: Goal: Risk for activity intolerance will decrease Outcome: Progressing   Problem: Nutrition: Goal: Adequate nutrition will be maintained Outcome: Progressing   Problem: Coping: Goal: Level of anxiety will decrease Outcome: Progressing   Problem: Education: Goal: Knowledge of disease or condition will improve Outcome: Progressing Goal: Knowledge of secondary prevention will improve (SELECT ALL) Outcome: Progressing Goal: Knowledge of patient specific risk factors will improve (INDIVIDUALIZE FOR PATIENT) Outcome: Progressing Goal: Individualized Educational Video(s) Outcome: Progressing   Problem: Coping: Goal: Will verbalize positive feelings about self Outcome: Progressing Goal: Will identify appropriate support needs Outcome: Progressing

## 2021-08-03 NOTE — Assessment & Plan Note (Addendum)
Holding amlodipine, diltiazem, metoprolol tartrate to allow for permissive hypertension>>restart after d/c

## 2021-08-03 NOTE — Progress Notes (Addendum)
Palliative-   Consult received.  Chart reviewed- noted patient is stable for discharge today. Unfortunately, will not be able to complete consult before discharge. Discussed with attending- no urgent palliative needs that need to be addressed before discharge.  I attempted to call daughter to discuss outpatient Palliative referral but received no answer. Recommend referral to outpatient Palliative by primary care after discharge.   Mariana Kaufman, AGNP-C Palliative Medicine  No charge

## 2021-08-03 NOTE — Evaluation (Signed)
Speech Language Pathology Evaluation Patient Details Name: [name] MRN: [id number] DOB: 1935-01-06 Today's Date: 08/03/2021 Time: 0930-1002 SLP Time Calculation (min) (ACUTE ONLY): 32 min  Problem List:  Patient Active Problem List   Diagnosis Date Noted   Sensory disturbance 08/03/2021   Impaired glucose tolerance 08/03/2021   Chronic left shoulder pain 07/23/2021   Hypokalemia 01/08/2021   TIA (transient ischemic attack) 01/06/2021   Encounter for general adult medical examination with abnormal findings 12/15/2020   Leg swelling 11/09/2020   Headache 08/12/2020   Prediabetes 03/13/2019   Complex partial seizure ([place]) 03/02/2019   Left hemiparesis ([place]) 03/01/2019   PVC (premature ventricular contraction) 11/16/2018   DDD (degenerative disc disease), cervical 08/16/2018   Mixed hyperlipidemia 08/16/2018   Insomnia 12/15/2015   Essential hypertension 11/02/2015   Hypothyroidism 11/02/2015   Chronic kidney disease, stage 3b ([place]) 11/02/2015   OA (osteoarthritis) of knee 11/02/2015   Colon cancer ([place]) 02/28/2011   History of colon cancer 06/29/2010   Constipation 06/10/2009   Past Medical History:  Past Medical History:  Diagnosis Date   Allergy    Arthritis    Colon cancer ([place])    colon ca dx 07/30/09   History of cardiac monitoring 07/2017   "Event monitor demonstrated sinus rhythm with isolated PACs and no arrhythmias"   History of colon cancer 06/2009   found at time of TCS 06/29/09, 1.2cm sessile cecal polyp, no adjuvent therapy needed   HTN (hypertension)    Hx of cardiovascular stress test 07/2017   "No diagnostic ST segment changes to indicate ischemia. Small, moderate intensity, reversible apical to basal inferolateral defect consistent with ischemia. This is a low risk study. Nuclear stress EF: 84%."   Hyperlipidemia    Hypothyroidism    PE (pulmonary thromboembolism) ([place])    Renal disorder    cyst on kidney    Silent micro-hemorrhage of brain  ([place]) 11/25/2018   Stroke ([place])    TIA (transient ischemic attack) 11/25/2018   Vertigo    Past Surgical History:  Past Surgical History:  Procedure Laterality Date   ABDOMINAL HYSTERECTOMY     COLON SURGERY  07/2009   right hemicolectomy, no residual colon cancer on path   COLONOSCOPY  07/16/2010   [id number] POLYP-TCS 3 YEARS   COLONOSCOPY N/A 08/02/2013   hyperplastic polyps, surveillance in 2020 if benefits outweight the risks   COLONOSCOPY  06/2009   1.2 cm sessile cecal polyp which had adenocarcinoma arising in a tubular adenoma.   PARTIAL THYMECTOMY     partial thyroidectomy     benign tumors   HPI:  [name] is a 87 y.o. female with medical history significant of hypertension, hyperlipidemia, TIA, pulmonary embolus (no longer on Eliquis due to microvascular hemorrhages), complex partial seizures who presents to the emergency department due to numbness of left side of face and weakness of left upper extremity which started around 5:30 PM while watching TV today.  Patient states that she called the daughter who came to her aid and took her to the ED for further evaluation and management.   Assessment / Plan / Recommendation Clinical Impression  Speech language evaluation completed via means of [place] examination. Pt achieved a score of 3/30 indicating severe cognitive impariment. MRI is negative for acute change and Pt does report baseline memory impairment so it is possible that Pt is functioning at baseline. Pt is oriented, pleasant and able to interact about familiar and recent events, however, her recall, problem solving,  calculations, executive functioning are all severely impaired. Further note she was unable to draw a clock; she correctly placed numbers 12, 1, 2 & 3 and then placed numbers 25,40, 52 and other numbers on the face. Pt reports that her daughter is at her house most of the time, takes care of her groceries, bills and medications. It seems as  though support and systems have already been put in place for cognitive decline, recommend continued support in the home environment to ensure safety. [place] ST for a more in depth cognitive assessment and treatment as indicated may be helpful. There are no further ST needs noted at this time, ST will sign off, thank you.    SLP Assessment  SLP Recommendation/Assessment: All further Speech Lanaguage Pathology  needs can be addressed in the next venue of care SLP Visit Diagnosis: Cognitive communication deficit (R41.841)    Recommendations for follow up therapy are one component of a multi-disciplinary discharge planning process, led by the attending physician.  Recommendations may be updated based on patient status, additional functional criteria and insurance authorization.    Follow Up Recommendations  Home health SLP    Assistance Recommended at Discharge  Frequent or constant Supervision/Assistance           SLP Evaluation Cognition  Overall Cognitive Status: History of cognitive impairments - at baseline Arousal/Alertness: Awake/alert Orientation Level: Oriented X4 Memory: Impaired Problem Solving: Impaired       Comprehension  Auditory Comprehension Overall Auditory Comprehension: Appears within functional limits for tasks assessed Visual Recognition/Discrimination Discrimination: Within Function Limits Reading Comprehension Reading Status: Not tested    Expression Expression Primary Mode of Expression: Verbal Verbal Expression Overall Verbal Expression: Appears within functional limits for tasks assessed Written Expression Dominant Hand: Right   Oral / Motor  Oral Motor/Sensory Function Overall Oral Motor/Sensory Function: Within functional limits Motor Speech Overall Motor Speech: Appears within functional limits for tasks assessed           Daquane Aguilar H. Roddie Mc, CCC-SLP Speech Language Pathologist  [place] Bushy 08/03/2021, 10:12 AM

## 2021-08-03 NOTE — Plan of Care (Signed)
  Problem: Acute Rehab PT Goals(only PT should resolve) Goal: Pt Will Go Supine/Side To Sit Outcome: Progressing Flowsheets (Taken 08/03/2021 1103) Pt will go Supine/Side to Sit:  Independently  with modified independence Goal: Patient Will Transfer Sit To/From Stand Outcome: Progressing Flowsheets (Taken 08/03/2021 1103) Patient will transfer sit to/from stand:  with modified independence  with supervision Goal: Pt Will Transfer Bed To Chair/Chair To Bed Outcome: Progressing Flowsheets (Taken 08/03/2021 1103) Pt will Transfer Bed to Chair/Chair to Bed:  with modified independence  with supervision Goal: Pt Will Perform Standing Balance Or Pre-Gait Outcome: Progressing Flowsheets (Taken 08/03/2021 1103) Pt will perform standing balance or pre-gait:  with Modified Independent  with Supervision Goal: Pt Will Ambulate Outcome: Progressing Flowsheets (Taken 08/03/2021 1103) Pt will Ambulate:  100 feet  with supervision  with modified independence  with cane   11:04 AM, 08/03/21 Lestine Box, S/PT

## 2021-08-03 NOTE — Assessment & Plan Note (Addendum)
Continue atorvastatin 80 mg daily LDL 76

## 2021-08-03 NOTE — Evaluation (Signed)
Physical Therapy Evaluation Patient Details Name: [name] MRN: [id number] DOB: Sep 22, 1934 Today's Date: 08/03/2021  History of Present Illness  [name] is a 87 y.o. female with medical history significant of hypertension, hyperlipidemia, TIA, pulmonary embolus (no longer on Eliquis due to microvascular hemorrhages), complex partial seizures who presents to the emergency department due to numbness of left side of face and weakness of left upper extremity which started around 5:30 PM while watching TV today.  Patient states that she called the daughter who came to her aid and took her to the ED for further evaluation and management.  She denies chest pain, shortness of breath, speech change, confusion left lower extremity weakness, recent fall or injuries.  Patient ambulates with a cane at baseline, she lives alone, but daughter checks on her regularly.  Patient presented with similar presentation during admission from 02/28/2019 to 03/02/2019 during which she had sensory disturbance/left-sided weakness and stroke studies at that time was negative, patient was said to have complex partial seizures and started on Keppra at that time.   Clinical Impression  Patient is independent with supine to sit bed mobility in appropriate amount of time. Patient demonstrates modified independence with sit to stand transfers and assistance of quad cane along with supervision provided by PT for safety purposes. Patient demonstrated slight labored movement along with generalized weakness of LE's observed during transfers. Patient was able to ambulate to bathroom independently with quad cane indicating good return for home. Patient was able to ambulate for 80 feet with quad cane and supervision provided by PT. Patient demonstrates slow cadence with gait pattern including gait deviations of decreased step/stride length observed during evaluation. Patient will benefit from continued skilled physical  therapy in hospital and recommended venue below to increase strength, balance, endurance for safe ADLs and gait.      Recommendations for follow up therapy are one component of a multi-disciplinary discharge planning process, led by the attending physician.  Recommendations may be updated based on patient status, additional functional criteria and insurance authorization.  Follow Up Recommendations Home health PT    Assistance Recommended at Discharge PRN  Patient can return home with the following  A little help with walking and/or transfers;Help with stairs or ramp for entrance;Assistance with cooking/housework;A little help with bathing/dressing/bathroom;Assist for transportation    Equipment Recommendations None recommended by PT  Recommendations for Other Services       Functional Status Assessment Patient has had a recent decline in their functional status and demonstrates the ability to make significant improvements in function in a reasonable and predictable amount of time.     Precautions / Restrictions Precautions Precautions: Fall Restrictions Weight Bearing Restrictions: No      Mobility  Bed Mobility Overal bed mobility: Independent             General bed mobility comments: Patient able to perform supine to sit in appropriate amount of time and great technical form.    Transfers Overall transfer level: Modified independent                 General transfer comment: Patient was able to perform sit to stand transfer with supervision and quad cane assist. Patient demonstrated mild labored movement along with generalized weakness of LE during transfer.    Ambulation/Gait Ambulation/Gait assistance: Modified independent (Device/Increase time), Supervision Gait Distance (Feet): 80 Feet Assistive device: Quad cane Gait Pattern/deviations: Decreased step length - right, Decreased step length - left, Decreased stride length, Narrow base  of support, Step-to  pattern Gait velocity: decreased     General Gait Details: Patient was able to ambulate for 80 feet with modified independence of quad cane and supervision by PT. Patient demonstrated a slow cadence with decreased step/stride length along with minor balance deficits impacting patient's gait speed.  Stairs            Wheelchair Mobility    Modified Rankin (Stroke Patients Only)       Balance Overall balance assessment: Needs assistance Sitting-balance support: No upper extremity supported, Feet supported Sitting balance-Leahy Scale: Good Sitting balance - Comments: Patient able to sit EOB with ability to support herself in upright position.   Standing balance support: Single extremity supported, During functional activity Standing balance-Leahy Scale: Fair Standing balance comment: Patient able to perform standing balance with quad cane but demonstrates mild balance deficits resulting in narrow stance and mild unsteadiness when reaching outside BOS.                             Pertinent Vitals/Pain Pain Assessment Pain Assessment: No/denies pain    Home Living Family/patient expects to be discharged to:: Private residence Living Arrangements: Alone Available Help at Discharge: Family;Available PRN/intermittently Type of Home: House Home Access: Stairs to enter Entrance Stairs-Rails: Right;Left;Can reach both Entrance Stairs-Number of Steps: 1   Home Layout: One level Home Equipment: Conservation officer, nature (2 wheels);Other (comment);BSC/3in1;Grab bars - tub/shower;Shower seat Additional Comments: Daughter availalbe PRN.    Prior Function Prior Level of Function : Needs assist       Physical Assist : Mobility (physical);ADLs (physical) Mobility (physical): Transfers ADLs (physical): IADLs Mobility Comments: Patient reports being independent with ambulation at home with intermittent use of quad cane. Patient is a limited community ambulator in which she does not  go out into community unless daughter is with her for safety. Patient reports using RW first thing in the morning. ADLs Comments: Patient reports being independent with ADL's and functional tasks with daughter to assist prn with groceries and cleaning when needed.     Hand Dominance   Dominant Hand: Right    Extremity/Trunk Assessment   Upper Extremity Assessment Upper Extremity Assessment: Defer to OT evaluation    Lower Extremity Assessment Lower Extremity Assessment: Generalized weakness    Cervical / Trunk Assessment Cervical / Trunk Assessment: Normal  Communication   Communication: No difficulties  Cognition Arousal/Alertness: Awake/alert Behavior During Therapy: WFL for tasks assessed/performed Overall Cognitive Status: History of cognitive impairments - at baseline                                          General Comments      Exercises     Assessment/Plan    PT Assessment Patient needs continued PT services;All further PT needs can be met in the next venue of care  PT Problem List Decreased strength;Decreased activity tolerance;Decreased balance;Decreased mobility;Decreased coordination       PT Treatment Interventions DME instruction;Gait training;Functional mobility training;Therapeutic activities;Therapeutic exercise;Balance training    PT Goals (Current goals can be found in the Care Plan section)  Acute Rehab PT Goals Patient Stated Goal: return home PT Goal Formulation: With patient Time For Goal Achievement: 08/17/21 Potential to Achieve Goals: Good    Frequency Min 3X/week     Co-evaluation PT/OT/SLP Co-Evaluation/Treatment: Yes Reason for Co-Treatment: To address functional/ADL  transfers PT goals addressed during session: Mobility/safety with mobility;Balance;Strengthening/ROM OT goals addressed during session: ADL's and self-care       AM-PAC PT "6 Clicks" Mobility  Outcome Measure Help needed turning from your back  to your side while in a flat bed without using bedrails?: None Help needed moving from lying on your back to sitting on the side of a flat bed without using bedrails?: None Help needed moving to and from a bed to a chair (including a wheelchair)?: A Little Help needed standing up from a chair using your arms (e.g., wheelchair or bedside chair)?: A Little Help needed to walk in hospital room?: A Little Help needed climbing 3-5 steps with a railing? : A Lot 6 Click Score: 19    End of Session   Activity Tolerance: Patient tolerated treatment well;Patient limited by fatigue;No increased pain Patient left: in chair;with call bell/phone within reach Nurse Communication: Mobility status PT Visit Diagnosis: Unsteadiness on feet (R26.81);Other abnormalities of gait and mobility (R26.89);Muscle weakness (generalized) (M62.81)    Time: 9470-7615 PT Time Calculation (min) (ACUTE ONLY): 24 min   Charges:   PT Evaluation $PT Eval Moderate Complexity: 1 Mod PT Treatments $Therapeutic Activity: 23-37 mins        11:02 AM, 08/03/21 Lestine Box, S/PT

## 2021-08-03 NOTE — Assessment & Plan Note (Addendum)
-  Appreciate Neurology Consult>>felt it was TIA vs recrudescence from previous stroke symptoms in setting of dehydration -PT/OT evaluation>>HHPT -Speech therapy eval -CT brain--negative -MRI brain--neg -CTA H&N--No intracranial large vessel occlusion. Redemonstrated moderate stenosis in the right supraclinoid ICA and mild stenosis in the left supraclinoid ICA -Echo--results pending at time of d/c -LDL--76 -07/26/2021 HbA1C--5.8 -Antiplatelet--ASA 81& plavix 75 mg x 21 days, then plavix thereafter

## 2021-08-04 ENCOUNTER — Telehealth: Payer: Self-pay

## 2021-08-04 NOTE — Telephone Encounter (Signed)
Transition Care Management Follow-up Telephone Call Date of discharge and from where: 08/03/21 [place] How have you been since you were released from the hospital? Pretty good  Any questions or concerns? No  Items Reviewed: Did the pt receive and understand the discharge instructions provided? Yes  Medications obtained and verified? Yes  Other? No  Any new allergies since your discharge? No  Dietary orders reviewed? Yes Do you have support at home? Yes   Home Care and Equipment/Supplies: Were home health services ordered? yes If so, what is the name of the agency? [facility].  Has the agency set up a time to come to the patient's home? no Were any new equipment or medical supplies ordered?  No What is the name of the medical supply agency? N/a Were you able to get the supplies/equipment? not applicable Do you have any questions related to the use of the equipment or supplies? No  Functional Questionnaire: (I = Independent and D = Dependent) ADLs: I  Bathing/Dressing- I  Meal Prep- I  Eating- I  Maintaining continence- I  Transferring/Ambulation- I  Managing Meds- I  Follow up appointments reviewed:  PCP Hospital f/u appt confirmed? Yes  Scheduled to see Peter Congo on 08/06/21 @ 2:40. [place] [facility] f/u appt confirmed? No   Are transportation arrangements needed? No  If their condition worsens, is the pt aware to call PCP or go to the Emergency Dept.? Yes Was the patient provided with contact information for the PCP's office or ED? Yes Was to pt encouraged to call back with questions or concerns? Yes

## 2021-08-05 ENCOUNTER — Telehealth: Payer: Self-pay

## 2021-08-05 DIAGNOSIS — N179 Acute kidney failure, unspecified: Secondary | ICD-10-CM | POA: Diagnosis not present

## 2021-08-05 DIAGNOSIS — R7302 Impaired glucose tolerance (oral): Secondary | ICD-10-CM | POA: Diagnosis not present

## 2021-08-05 DIAGNOSIS — E039 Hypothyroidism, unspecified: Secondary | ICD-10-CM | POA: Diagnosis not present

## 2021-08-05 DIAGNOSIS — N390 Urinary tract infection, site not specified: Secondary | ICD-10-CM | POA: Diagnosis not present

## 2021-08-05 DIAGNOSIS — Z86711 Personal history of pulmonary embolism: Secondary | ICD-10-CM | POA: Diagnosis not present

## 2021-08-05 DIAGNOSIS — I129 Hypertensive chronic kidney disease with stage 1 through stage 4 chronic kidney disease, or unspecified chronic kidney disease: Secondary | ICD-10-CM | POA: Diagnosis not present

## 2021-08-05 DIAGNOSIS — N1832 Chronic kidney disease, stage 3b: Secondary | ICD-10-CM | POA: Diagnosis not present

## 2021-08-05 DIAGNOSIS — Z8673 Personal history of transient ischemic attack (TIA), and cerebral infarction without residual deficits: Secondary | ICD-10-CM | POA: Diagnosis not present

## 2021-08-05 DIAGNOSIS — Z9181 History of falling: Secondary | ICD-10-CM | POA: Diagnosis not present

## 2021-08-05 DIAGNOSIS — Z79891 Long term (current) use of opiate analgesic: Secondary | ICD-10-CM | POA: Diagnosis not present

## 2021-08-05 DIAGNOSIS — Z7982 Long term (current) use of aspirin: Secondary | ICD-10-CM | POA: Diagnosis not present

## 2021-08-05 DIAGNOSIS — M1990 Unspecified osteoarthritis, unspecified site: Secondary | ICD-10-CM | POA: Diagnosis not present

## 2021-08-05 DIAGNOSIS — E782 Mixed hyperlipidemia: Secondary | ICD-10-CM | POA: Diagnosis not present

## 2021-08-05 NOTE — Telephone Encounter (Signed)
[name] called from [name] to let [name] know she just got out of hospital after a day. She had PIA and discharged and order physical therapy and will see patient 1 x week for 4 weeks and extra visit if patient needs it.   Physical therapy  will be calling for frequency of Physical therapy. Has no open wounds, [facility] nurse will be doing medications and diagnosis and try to keep her out of the hospital.  [name] will be faxing form over to sign for approval from provider. # [phone]

## 2021-08-06 ENCOUNTER — Encounter: Payer: Self-pay | Admitting: Family Medicine

## 2021-08-06 ENCOUNTER — Ambulatory Visit (INDEPENDENT_AMBULATORY_CARE_PROVIDER_SITE_OTHER): Payer: Medicare Other | Admitting: Family Medicine

## 2021-08-06 VITALS — BP 144/84 | HR 59 | Ht 64.0 in | Wt 158.0 lb

## 2021-08-06 DIAGNOSIS — G459 Transient cerebral ischemic attack, unspecified: Secondary | ICD-10-CM

## 2021-08-06 NOTE — Patient Instructions (Signed)
I appreciate the opportunity to provide care to you today!     Labs: please stop by the lab today to get your blood drawn (CBC, BMP)  Please continue to a heart-healthy diet and increase your physical activities. Try to exercise for 30mins at least three times a week.      It was a pleasure to see you and I look forward to continuing to work together on your health and well-being. Please do not hesitate to call the office if you need care or have questions about your care.   Have a wonderful day and week. With Gratitude, Wilhelmina Hark MSN, FNP-BC  

## 2021-08-06 NOTE — Assessment & Plan Note (Signed)
-  She denies stroke-like symptoms -Will be following up with PT, PT, and neurology -Pending CBC and BMP -Labs and imaging reviewed

## 2021-08-06 NOTE — Progress Notes (Signed)
Established Patient Office Visit  Subjective:  Patient ID: [name], female    DOB: 1934/03/28  Age: 87 y.o. MRN: [id number]  CC:  Chief Complaint  Patient presents with   Hospitalization Follow-up    D/C [place] 08/03/21 for TIA, feeling good now weakness in legs patient feels weak.    HPI [name] is a 87 y.o. female with past medical history of essential hypertension, TIA, and hypothyroidism presents for hospital f/u status post-TIA. She was seen in the ED on 08/02/21 after reported symptoms of left facial numbness and left upper extremity weakness that began around 5:30 PM on 08/02/21. She was afebrile and hemodynamically stable with oxygen saturation 100% room air in the ED. CT of the brain was negative for acute findings.  EKG shows sinus rhythm with nonspecific ST changes.  CTA head and neck were negative for large vessel occlusion.  There was moderate stenosis in the right supraclinoid ICA with mild stenosis in the left supraclinoid ICA--no hemodynamically significant stenosis in the neck.  UA showed 21-50 WBC and was discharged home with cefdinir. She will be completing treatment today. She was started on ASA 81& plavix 75 mg x 21 days, then Plavix.    At today's visit, the patient reported feeling fatigued with weakness in her legs. She is drinking and staying well hydrated. She had an appointment with PT today, but they canceled. She will be following up with neurology next month and reports that home health visited today.   Past Medical History:  Diagnosis Date   Allergy    Arthritis    Colon cancer ([place])    colon ca dx 07/30/09   History of cardiac monitoring 07/2017   "Event monitor demonstrated sinus rhythm with isolated PACs and no arrhythmias"   History of colon cancer 06/2009   found at time of TCS 06/29/09, 1.2cm sessile cecal polyp, no adjuvent therapy needed   HTN (hypertension)    Hx of cardiovascular stress test 07/2017   "No diagnostic  ST segment changes to indicate ischemia. Small, moderate intensity, reversible apical to basal inferolateral defect consistent with ischemia. This is a low risk study. Nuclear stress EF: 84%."   Hyperlipidemia    Hypothyroidism    PE (pulmonary thromboembolism) ([place])    Renal disorder    cyst on kidney    Silent micro-hemorrhage of brain ([place]) 11/25/2018   Stroke [facility])    TIA (transient ischemic attack) 11/25/2018   Vertigo     Past Surgical History:  Procedure Laterality Date   ABDOMINAL HYSTERECTOMY     COLON SURGERY  07/2009   right hemicolectomy, no residual colon cancer on path   COLONOSCOPY  07/16/2010   YHC:[id number] POLYP-TCS 3 YEARS   COLONOSCOPY N/A 08/02/2013   hyperplastic polyps, surveillance in 2020 if benefits outweight the risks   COLONOSCOPY  06/2009   1.2 cm sessile cecal polyp which had adenocarcinoma arising in a tubular adenoma.   PARTIAL THYMECTOMY     partial thyroidectomy     benign tumors    Family History  Problem Relation Age of Onset   Colon cancer Mother        >age14   Arthritis Mother    Cancer Mother    Heart disease Mother    Hyperlipidemia Mother    Hypertension Mother    Heart attack Father    Heart disease Father    Diabetes Maternal Aunt    Hyperlipidemia Daughter    Hypertension Daughter  Liver disease Neg Hx     Social History   Socioeconomic History   Marital status: Widowed    Spouse name: Not on file   Number of children: 2   Years of education: Not on file   Highest education level: Not on file  Occupational History   Occupation: Psychologist, 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   Occupation: retired from [employer] fundraiser  Tobacco Use   Smoking status: Never   Smokeless tobacco: Never  Vaping Use   Vaping Use: Never used  Substance and Sexual Activity   Alcohol use: No    Alcohol/week: 0.0 standard drinks   Drug use: No   Sexual activity: Not Currently  Other Topics Concern   Not on file  Social History Narrative   Not on  file   Social Determinants of Health   Financial Resource Strain: Low Risk    Difficulty of Paying Living Expenses: Not very hard  Food Insecurity: No Food Insecurity   Worried About Charity fundraiser in the Last Year: Never true   [place] in the Last Year: Never true  Transportation Needs: No Transportation Needs   Lack of Transportation (Medical): No   Lack of Transportation (Non-Medical): No  Physical Activity: Inactive   Days of Exercise per Week: 0 days   Minutes of Exercise per Session: 0 min  Stress: No Stress Concern Present   Feeling of Stress : Only a little  Social Connections: Moderately Isolated   Frequency of Communication with Friends and Family: More than three times a week   Frequency of Social Gatherings with Friends and Family: More than three times a week   Attends Religious Services: More than 4 times per year   Active Member of [employer] or Organizations: No   Attends Archivist Meetings: Never   Marital Status: Widowed  Human resources officer Violence: Not At Risk   Fear of Current or Ex-Partner: No   Emotionally Abused: No   Physically Abused: No   Sexually Abused: No    Outpatient Medications Prior to Visit  Medication Sig Dispense Refill   acetaminophen (TYLENOL) 500 MG tablet Take 1,000 mg by mouth every 8 (eight) hours as needed for mild pain or headache.     amLODipine (NORVASC) 5 MG tablet Take 1 tablet (5 mg total) by mouth daily. 90 tablet 3   ascorbic acid (VITAMIN C) 250 MG CHEW Chew 250 mg by mouth daily.     ASPERCREME LIDOCAINE EX Apply 1 application topically daily as needed (for knee pain).      aspirin EC 81 MG tablet Take 1 tablet (81 mg total) by mouth daily. X 20 days 30 tablet 0   atorvastatin (LIPITOR) 80 MG tablet TAKE 1 TABLET BY MOUTH ONCE A DAY. 90 tablet 0   cefdinir (OMNICEF) 300 MG capsule Take 1 capsule (300 mg total) by mouth every 12 (twelve) hours. 6 capsule 0   cholecalciferol (VITAMIN D3) 25 MCG (1000 UT)  tablet Take 1,000 Units by mouth daily.      clopidogrel (PLAVIX) 75 MG tablet Take 1 tablet (75 mg total) by mouth daily. 30 tablet 2   diltiazem (CARDIZEM) 30 MG tablet Take 1 tablet (30 mg total) by mouth 2 (two) times daily. 180 tablet 3   gabapentin (NEURONTIN) 300 MG capsule Take 300 mg by mouth 3 (three) times daily.     levETIRAcetam (KEPPRA) 500 MG tablet Take 250 mg by mouth 2 (two) times daily.  LINZESS 72 MCG capsule TAKE (1) CAPSULE BY MOUTH DAILY AS NEEDED FOR CONSTIPATION. (Patient taking differently: Take 72 mcg by mouth every other day.) 90 capsule 3   loratadine (CLARITIN) 10 MG tablet Take 10 mg by mouth daily.      meclizine (ANTIVERT) 25 MG tablet Take 1 tablet (25 mg total) by mouth 3 (three) times daily as needed for dizziness. 15 tablet 0   metoprolol tartrate (LOPRESSOR) 50 MG tablet Take 2 tablets (100 mg total) by mouth 2 (two) times daily. 360 tablet 3   RESTASIS 0.05 % ophthalmic emulsion Place 1 drop into both eyes 2 (two) times daily as needed (chronic dry eye).     topiramate (TOPAMAX) 50 MG tablet Take 50 mg by mouth 2 (two) times daily.     traMADol (ULTRAM) 50 MG tablet Take 25 mg by mouth every 8 (eight) hours as needed for moderate pain.     No facility-administered medications prior to visit.    Allergies  Allergen Reactions   Tape Rash    Zio monitor adhesive causes ulcerated and infected skin .Had to see derm    ROS Review of Systems  Constitutional:  Positive for fatigue. Negative for chills and fever.  HENT:  Negative for sinus pressure, sneezing and sore throat.   Eyes:  Negative for discharge and itching.  Respiratory:  Negative for chest tightness, wheezing and stridor.   Cardiovascular:  Negative for chest pain and palpitations.  Gastrointestinal:  Negative for diarrhea, nausea and vomiting.  Endocrine: Negative for polyphagia and polyuria.  Genitourinary:  Negative for difficulty urinating, dyspareunia and frequency.  Musculoskeletal:   Negative for back pain.  Neurological:  Positive for weakness. Negative for numbness.  Psychiatric/Behavioral:  Negative for self-injury and suicidal ideas.      Objective:    Physical Exam HENT:     Head: Normocephalic.     Right Ear: External ear normal.     Left Ear: External ear normal.  Eyes:     General: No visual field deficit. Cardiovascular:     Rate and Rhythm: Normal rate and regular rhythm.     Pulses: Normal pulses.     Heart sounds: Normal heart sounds.  Pulmonary:     Effort: Pulmonary effort is normal.     Breath sounds: Normal breath sounds.  Neurological:     Mental Status: She is alert and oriented to person, place, and time.     GCS: GCS eye subscore is 4. GCS verbal subscore is 5.     Sensory: Sensation is intact.     Motor: Weakness present.     Coordination: Coordination normal. Finger-Nose-Finger Test normal.    BP (!) 144/84   Pulse (!) 59   Ht $R'5\' 4"'jE$  (1.626 m)   Wt 158 lb (71.7 kg)   SpO2 98%   BMI 27.12 kg/m  Wt Readings from Last 3 Encounters:  08/06/21 158 lb (71.7 kg)  08/02/21 158 lb 15.2 oz (72.1 kg)  07/19/21 160 lb 9.6 oz (72.8 kg)    Lab Results  Component Value Date   TSH 0.357 01/06/2021   Lab Results  Component Value Date   WBC 5.7 08/02/2021   HGB 15.0 08/02/2021   HCT 44.0 08/02/2021   MCV 100.2 (H) 08/02/2021   PLT 268 08/02/2021   Lab Results  Component Value Date   NA 143 08/03/2021   K 3.8 08/03/2021   CHLORIDE 105 04/16/2015   CO2 25 08/03/2021   GLUCOSE 100 (H)  08/03/2021   BUN 22 08/03/2021   CREATININE 1.19 (H) 08/03/2021   BILITOT 0.3 08/02/2021   ALKPHOS 98 08/02/2021   AST 20 08/02/2021   ALT 20 08/02/2021   PROT 7.6 08/02/2021   ALBUMIN 4.2 08/02/2021   CALCIUM 9.1 08/03/2021   ANIONGAP 5 08/03/2021   EGFR 39 (L) 01/13/2021   Lab Results  Component Value Date   CHOL 144 08/03/2021   Lab Results  Component Value Date   HDL 62 08/03/2021   Lab Results  Component Value Date   LDLCALC 76  08/03/2021   Lab Results  Component Value Date   TRIG 28 08/03/2021   Lab Results  Component Value Date   CHOLHDL 2.3 08/03/2021   Lab Results  Component Value Date   HGBA1C 6.1 (H) 08/03/2021      Assessment & Plan:   Problem List Items Addressed This Visit       Cardiovascular and Mediastinum   TIA (transient ischemic attack) - Primary    -She denies stroke-like symptoms -Will be following up with PT, PT, and neurology -Pending CBC and BMP -Labs and imaging reviewed        Relevant Orders   CBC with Differential/Platelet   Basic Metabolic Panel (BMET)    No orders of the defined types were placed in this encounter.   Follow-up: No follow-ups on file.    Alvira Monday, FNP

## 2021-08-07 LAB — BASIC METABOLIC PANEL
BUN/Creatinine Ratio: 14 (ref 12–28)
BUN: 21 mg/dL (ref 8–27)
CO2: 23 mmol/L (ref 20–29)
Calcium: 9.4 mg/dL (ref 8.7–10.3)
Chloride: 110 mmol/L — ABNORMAL HIGH (ref 96–106)
Creatinine, Ser: 1.46 mg/dL — ABNORMAL HIGH (ref 0.57–1.00)
Glucose: 70 mg/dL (ref 70–99)
Potassium: 4.3 mmol/L (ref 3.5–5.2)
Sodium: 145 mmol/L — ABNORMAL HIGH (ref 134–144)
eGFR: 35 mL/min/{1.73_m2} — ABNORMAL LOW (ref >59)

## 2021-08-07 LAB — CBC WITH DIFFERENTIAL/PLATELET
Basophils Absolute: 0 10*3/uL (ref 0.0–0.2)
Basos: 1 %
EOS (ABSOLUTE): 0.3 10*3/uL (ref 0.0–0.4)
Eos: 5 %
Hematocrit: 41.3 % (ref 34.0–46.6)
Hemoglobin: 13.7 g/dL (ref 11.1–15.9)
Immature Grans (Abs): 0 10*3/uL (ref 0.0–0.1)
Immature Granulocytes: 0 %
Lymphocytes Absolute: 1.8 10*3/uL (ref 0.7–3.1)
Lymphs: 39 %
MCH: 32 pg (ref 26.6–33.0)
MCHC: 33.2 g/dL (ref 31.5–35.7)
MCV: 97 fL (ref 79–97)
Monocytes Absolute: 0.4 10*3/uL (ref 0.1–0.9)
Monocytes: 9 %
Neutrophils Absolute: 2.1 10*3/uL (ref 1.4–7.0)
Neutrophils: 46 %
Platelets: 266 10*3/uL (ref 150–450)
RBC: 4.28 x10E6/uL (ref 3.77–5.28)
RDW: 13.9 % (ref 11.7–15.4)
WBC: 4.7 10*3/uL (ref 3.4–10.8)

## 2021-08-10 DIAGNOSIS — N179 Acute kidney failure, unspecified: Secondary | ICD-10-CM | POA: Diagnosis not present

## 2021-08-10 DIAGNOSIS — Z86711 Personal history of pulmonary embolism: Secondary | ICD-10-CM | POA: Diagnosis not present

## 2021-08-10 DIAGNOSIS — R7302 Impaired glucose tolerance (oral): Secondary | ICD-10-CM | POA: Diagnosis not present

## 2021-08-10 DIAGNOSIS — N1832 Chronic kidney disease, stage 3b: Secondary | ICD-10-CM | POA: Diagnosis not present

## 2021-08-10 DIAGNOSIS — I129 Hypertensive chronic kidney disease with stage 1 through stage 4 chronic kidney disease, or unspecified chronic kidney disease: Secondary | ICD-10-CM | POA: Diagnosis not present

## 2021-08-10 DIAGNOSIS — Z9181 History of falling: Secondary | ICD-10-CM | POA: Diagnosis not present

## 2021-08-10 DIAGNOSIS — E782 Mixed hyperlipidemia: Secondary | ICD-10-CM | POA: Diagnosis not present

## 2021-08-10 DIAGNOSIS — M1990 Unspecified osteoarthritis, unspecified site: Secondary | ICD-10-CM | POA: Diagnosis not present

## 2021-08-10 DIAGNOSIS — N390 Urinary tract infection, site not specified: Secondary | ICD-10-CM | POA: Diagnosis not present

## 2021-08-10 DIAGNOSIS — Z8673 Personal history of transient ischemic attack (TIA), and cerebral infarction without residual deficits: Secondary | ICD-10-CM | POA: Diagnosis not present

## 2021-08-10 DIAGNOSIS — Z7982 Long term (current) use of aspirin: Secondary | ICD-10-CM | POA: Diagnosis not present

## 2021-08-10 DIAGNOSIS — Z79891 Long term (current) use of opiate analgesic: Secondary | ICD-10-CM | POA: Diagnosis not present

## 2021-08-10 DIAGNOSIS — E039 Hypothyroidism, unspecified: Secondary | ICD-10-CM | POA: Diagnosis not present

## 2021-08-10 NOTE — Progress Notes (Signed)
Please inform the patient to increaser her fluid intake, as tolerated, because her labs indicated that she's dehydrate

## 2021-08-11 DIAGNOSIS — E039 Hypothyroidism, unspecified: Secondary | ICD-10-CM | POA: Diagnosis not present

## 2021-08-11 DIAGNOSIS — N1832 Chronic kidney disease, stage 3b: Secondary | ICD-10-CM | POA: Diagnosis not present

## 2021-08-11 DIAGNOSIS — Z79891 Long term (current) use of opiate analgesic: Secondary | ICD-10-CM | POA: Diagnosis not present

## 2021-08-11 DIAGNOSIS — I129 Hypertensive chronic kidney disease with stage 1 through stage 4 chronic kidney disease, or unspecified chronic kidney disease: Secondary | ICD-10-CM | POA: Diagnosis not present

## 2021-08-11 DIAGNOSIS — Z7982 Long term (current) use of aspirin: Secondary | ICD-10-CM | POA: Diagnosis not present

## 2021-08-11 DIAGNOSIS — Z9181 History of falling: Secondary | ICD-10-CM | POA: Diagnosis not present

## 2021-08-11 DIAGNOSIS — R7302 Impaired glucose tolerance (oral): Secondary | ICD-10-CM | POA: Diagnosis not present

## 2021-08-11 DIAGNOSIS — E782 Mixed hyperlipidemia: Secondary | ICD-10-CM | POA: Diagnosis not present

## 2021-08-11 DIAGNOSIS — Z86711 Personal history of pulmonary embolism: Secondary | ICD-10-CM | POA: Diagnosis not present

## 2021-08-11 DIAGNOSIS — N179 Acute kidney failure, unspecified: Secondary | ICD-10-CM | POA: Diagnosis not present

## 2021-08-11 DIAGNOSIS — M1990 Unspecified osteoarthritis, unspecified site: Secondary | ICD-10-CM | POA: Diagnosis not present

## 2021-08-11 DIAGNOSIS — N390 Urinary tract infection, site not specified: Secondary | ICD-10-CM | POA: Diagnosis not present

## 2021-08-11 DIAGNOSIS — Z8673 Personal history of transient ischemic attack (TIA), and cerebral infarction without residual deficits: Secondary | ICD-10-CM | POA: Diagnosis not present

## 2021-08-12 DIAGNOSIS — E039 Hypothyroidism, unspecified: Secondary | ICD-10-CM

## 2021-08-12 DIAGNOSIS — Z8673 Personal history of transient ischemic attack (TIA), and cerebral infarction without residual deficits: Secondary | ICD-10-CM

## 2021-08-12 DIAGNOSIS — N179 Acute kidney failure, unspecified: Secondary | ICD-10-CM | POA: Diagnosis not present

## 2021-08-12 DIAGNOSIS — Z9181 History of falling: Secondary | ICD-10-CM

## 2021-08-12 DIAGNOSIS — E782 Mixed hyperlipidemia: Secondary | ICD-10-CM

## 2021-08-12 DIAGNOSIS — Z79891 Long term (current) use of opiate analgesic: Secondary | ICD-10-CM

## 2021-08-12 DIAGNOSIS — I129 Hypertensive chronic kidney disease with stage 1 through stage 4 chronic kidney disease, or unspecified chronic kidney disease: Secondary | ICD-10-CM | POA: Diagnosis not present

## 2021-08-12 DIAGNOSIS — G40209 Localization-related (focal) (partial) symptomatic epilepsy and epileptic syndromes with complex partial seizures, not intractable, without status epilepticus: Secondary | ICD-10-CM | POA: Diagnosis not present

## 2021-08-12 DIAGNOSIS — N1832 Chronic kidney disease, stage 3b: Secondary | ICD-10-CM | POA: Diagnosis not present

## 2021-08-12 DIAGNOSIS — R7302 Impaired glucose tolerance (oral): Secondary | ICD-10-CM

## 2021-08-12 DIAGNOSIS — M1990 Unspecified osteoarthritis, unspecified site: Secondary | ICD-10-CM

## 2021-08-12 DIAGNOSIS — Z7982 Long term (current) use of aspirin: Secondary | ICD-10-CM

## 2021-08-12 DIAGNOSIS — N390 Urinary tract infection, site not specified: Secondary | ICD-10-CM

## 2021-08-12 DIAGNOSIS — Z86711 Personal history of pulmonary embolism: Secondary | ICD-10-CM

## 2021-08-13 DIAGNOSIS — Z7982 Long term (current) use of aspirin: Secondary | ICD-10-CM | POA: Diagnosis not present

## 2021-08-13 DIAGNOSIS — Z9181 History of falling: Secondary | ICD-10-CM | POA: Diagnosis not present

## 2021-08-13 DIAGNOSIS — M1990 Unspecified osteoarthritis, unspecified site: Secondary | ICD-10-CM | POA: Diagnosis not present

## 2021-08-13 DIAGNOSIS — N390 Urinary tract infection, site not specified: Secondary | ICD-10-CM | POA: Diagnosis not present

## 2021-08-13 DIAGNOSIS — R7302 Impaired glucose tolerance (oral): Secondary | ICD-10-CM | POA: Diagnosis not present

## 2021-08-13 DIAGNOSIS — Z86711 Personal history of pulmonary embolism: Secondary | ICD-10-CM | POA: Diagnosis not present

## 2021-08-13 DIAGNOSIS — I129 Hypertensive chronic kidney disease with stage 1 through stage 4 chronic kidney disease, or unspecified chronic kidney disease: Secondary | ICD-10-CM | POA: Diagnosis not present

## 2021-08-13 DIAGNOSIS — Z8673 Personal history of transient ischemic attack (TIA), and cerebral infarction without residual deficits: Secondary | ICD-10-CM | POA: Diagnosis not present

## 2021-08-13 DIAGNOSIS — Z79891 Long term (current) use of opiate analgesic: Secondary | ICD-10-CM | POA: Diagnosis not present

## 2021-08-13 DIAGNOSIS — E782 Mixed hyperlipidemia: Secondary | ICD-10-CM | POA: Diagnosis not present

## 2021-08-13 DIAGNOSIS — N179 Acute kidney failure, unspecified: Secondary | ICD-10-CM | POA: Diagnosis not present

## 2021-08-13 DIAGNOSIS — N1832 Chronic kidney disease, stage 3b: Secondary | ICD-10-CM | POA: Diagnosis not present

## 2021-08-13 DIAGNOSIS — E039 Hypothyroidism, unspecified: Secondary | ICD-10-CM | POA: Diagnosis not present

## 2021-08-16 DIAGNOSIS — Z79891 Long term (current) use of opiate analgesic: Secondary | ICD-10-CM | POA: Diagnosis not present

## 2021-08-16 DIAGNOSIS — N1832 Chronic kidney disease, stage 3b: Secondary | ICD-10-CM | POA: Diagnosis not present

## 2021-08-16 DIAGNOSIS — Z7982 Long term (current) use of aspirin: Secondary | ICD-10-CM | POA: Diagnosis not present

## 2021-08-16 DIAGNOSIS — N390 Urinary tract infection, site not specified: Secondary | ICD-10-CM | POA: Diagnosis not present

## 2021-08-16 DIAGNOSIS — M1990 Unspecified osteoarthritis, unspecified site: Secondary | ICD-10-CM | POA: Diagnosis not present

## 2021-08-16 DIAGNOSIS — R7302 Impaired glucose tolerance (oral): Secondary | ICD-10-CM | POA: Diagnosis not present

## 2021-08-16 DIAGNOSIS — Z9181 History of falling: Secondary | ICD-10-CM | POA: Diagnosis not present

## 2021-08-16 DIAGNOSIS — E782 Mixed hyperlipidemia: Secondary | ICD-10-CM | POA: Diagnosis not present

## 2021-08-16 DIAGNOSIS — N179 Acute kidney failure, unspecified: Secondary | ICD-10-CM | POA: Diagnosis not present

## 2021-08-16 DIAGNOSIS — Z86711 Personal history of pulmonary embolism: Secondary | ICD-10-CM | POA: Diagnosis not present

## 2021-08-16 DIAGNOSIS — Z8673 Personal history of transient ischemic attack (TIA), and cerebral infarction without residual deficits: Secondary | ICD-10-CM | POA: Diagnosis not present

## 2021-08-16 DIAGNOSIS — I129 Hypertensive chronic kidney disease with stage 1 through stage 4 chronic kidney disease, or unspecified chronic kidney disease: Secondary | ICD-10-CM | POA: Diagnosis not present

## 2021-08-16 DIAGNOSIS — E039 Hypothyroidism, unspecified: Secondary | ICD-10-CM | POA: Diagnosis not present

## 2021-08-18 DIAGNOSIS — E039 Hypothyroidism, unspecified: Secondary | ICD-10-CM | POA: Diagnosis not present

## 2021-08-18 DIAGNOSIS — Z86711 Personal history of pulmonary embolism: Secondary | ICD-10-CM | POA: Diagnosis not present

## 2021-08-18 DIAGNOSIS — Z79891 Long term (current) use of opiate analgesic: Secondary | ICD-10-CM | POA: Diagnosis not present

## 2021-08-18 DIAGNOSIS — Z7982 Long term (current) use of aspirin: Secondary | ICD-10-CM | POA: Diagnosis not present

## 2021-08-18 DIAGNOSIS — R7302 Impaired glucose tolerance (oral): Secondary | ICD-10-CM | POA: Diagnosis not present

## 2021-08-18 DIAGNOSIS — Z9181 History of falling: Secondary | ICD-10-CM | POA: Diagnosis not present

## 2021-08-18 DIAGNOSIS — I129 Hypertensive chronic kidney disease with stage 1 through stage 4 chronic kidney disease, or unspecified chronic kidney disease: Secondary | ICD-10-CM | POA: Diagnosis not present

## 2021-08-18 DIAGNOSIS — M1990 Unspecified osteoarthritis, unspecified site: Secondary | ICD-10-CM | POA: Diagnosis not present

## 2021-08-18 DIAGNOSIS — Z8673 Personal history of transient ischemic attack (TIA), and cerebral infarction without residual deficits: Secondary | ICD-10-CM | POA: Diagnosis not present

## 2021-08-18 DIAGNOSIS — N179 Acute kidney failure, unspecified: Secondary | ICD-10-CM | POA: Diagnosis not present

## 2021-08-18 DIAGNOSIS — E782 Mixed hyperlipidemia: Secondary | ICD-10-CM | POA: Diagnosis not present

## 2021-08-18 DIAGNOSIS — N1832 Chronic kidney disease, stage 3b: Secondary | ICD-10-CM | POA: Diagnosis not present

## 2021-08-18 DIAGNOSIS — N390 Urinary tract infection, site not specified: Secondary | ICD-10-CM | POA: Diagnosis not present

## 2021-08-19 DIAGNOSIS — Z86711 Personal history of pulmonary embolism: Secondary | ICD-10-CM | POA: Diagnosis not present

## 2021-08-19 DIAGNOSIS — N390 Urinary tract infection, site not specified: Secondary | ICD-10-CM | POA: Diagnosis not present

## 2021-08-19 DIAGNOSIS — N1832 Chronic kidney disease, stage 3b: Secondary | ICD-10-CM | POA: Diagnosis not present

## 2021-08-19 DIAGNOSIS — Z79891 Long term (current) use of opiate analgesic: Secondary | ICD-10-CM | POA: Diagnosis not present

## 2021-08-19 DIAGNOSIS — I129 Hypertensive chronic kidney disease with stage 1 through stage 4 chronic kidney disease, or unspecified chronic kidney disease: Secondary | ICD-10-CM | POA: Diagnosis not present

## 2021-08-19 DIAGNOSIS — M1990 Unspecified osteoarthritis, unspecified site: Secondary | ICD-10-CM | POA: Diagnosis not present

## 2021-08-19 DIAGNOSIS — Z7982 Long term (current) use of aspirin: Secondary | ICD-10-CM | POA: Diagnosis not present

## 2021-08-19 DIAGNOSIS — E039 Hypothyroidism, unspecified: Secondary | ICD-10-CM | POA: Diagnosis not present

## 2021-08-19 DIAGNOSIS — Z8673 Personal history of transient ischemic attack (TIA), and cerebral infarction without residual deficits: Secondary | ICD-10-CM | POA: Diagnosis not present

## 2021-08-19 DIAGNOSIS — E782 Mixed hyperlipidemia: Secondary | ICD-10-CM | POA: Diagnosis not present

## 2021-08-19 DIAGNOSIS — N179 Acute kidney failure, unspecified: Secondary | ICD-10-CM | POA: Diagnosis not present

## 2021-08-19 DIAGNOSIS — R7302 Impaired glucose tolerance (oral): Secondary | ICD-10-CM | POA: Diagnosis not present

## 2021-08-19 DIAGNOSIS — Z9181 History of falling: Secondary | ICD-10-CM | POA: Diagnosis not present

## 2021-08-23 DIAGNOSIS — M1990 Unspecified osteoarthritis, unspecified site: Secondary | ICD-10-CM | POA: Diagnosis not present

## 2021-08-23 DIAGNOSIS — N179 Acute kidney failure, unspecified: Secondary | ICD-10-CM | POA: Diagnosis not present

## 2021-08-23 DIAGNOSIS — E039 Hypothyroidism, unspecified: Secondary | ICD-10-CM | POA: Diagnosis not present

## 2021-08-23 DIAGNOSIS — Z79891 Long term (current) use of opiate analgesic: Secondary | ICD-10-CM | POA: Diagnosis not present

## 2021-08-23 DIAGNOSIS — Z8673 Personal history of transient ischemic attack (TIA), and cerebral infarction without residual deficits: Secondary | ICD-10-CM | POA: Diagnosis not present

## 2021-08-23 DIAGNOSIS — E782 Mixed hyperlipidemia: Secondary | ICD-10-CM | POA: Diagnosis not present

## 2021-08-23 DIAGNOSIS — Z9181 History of falling: Secondary | ICD-10-CM | POA: Diagnosis not present

## 2021-08-23 DIAGNOSIS — N1832 Chronic kidney disease, stage 3b: Secondary | ICD-10-CM | POA: Diagnosis not present

## 2021-08-23 DIAGNOSIS — Z86711 Personal history of pulmonary embolism: Secondary | ICD-10-CM | POA: Diagnosis not present

## 2021-08-23 DIAGNOSIS — R7302 Impaired glucose tolerance (oral): Secondary | ICD-10-CM | POA: Diagnosis not present

## 2021-08-23 DIAGNOSIS — I129 Hypertensive chronic kidney disease with stage 1 through stage 4 chronic kidney disease, or unspecified chronic kidney disease: Secondary | ICD-10-CM | POA: Diagnosis not present

## 2021-08-23 DIAGNOSIS — Z7982 Long term (current) use of aspirin: Secondary | ICD-10-CM | POA: Diagnosis not present

## 2021-08-23 DIAGNOSIS — N390 Urinary tract infection, site not specified: Secondary | ICD-10-CM | POA: Diagnosis not present

## 2021-08-24 DIAGNOSIS — G890 Central pain syndrome: Secondary | ICD-10-CM | POA: Diagnosis not present

## 2021-08-24 DIAGNOSIS — I6930 Unspecified sequelae of cerebral infarction: Secondary | ICD-10-CM | POA: Diagnosis not present

## 2021-08-24 DIAGNOSIS — G43C1 Periodic headache syndromes in child or adult, intractable: Secondary | ICD-10-CM | POA: Diagnosis not present

## 2021-08-25 DIAGNOSIS — R7302 Impaired glucose tolerance (oral): Secondary | ICD-10-CM | POA: Diagnosis not present

## 2021-08-25 DIAGNOSIS — M1990 Unspecified osteoarthritis, unspecified site: Secondary | ICD-10-CM | POA: Diagnosis not present

## 2021-08-25 DIAGNOSIS — Z86711 Personal history of pulmonary embolism: Secondary | ICD-10-CM | POA: Diagnosis not present

## 2021-08-25 DIAGNOSIS — E039 Hypothyroidism, unspecified: Secondary | ICD-10-CM | POA: Diagnosis not present

## 2021-08-25 DIAGNOSIS — Z79891 Long term (current) use of opiate analgesic: Secondary | ICD-10-CM | POA: Diagnosis not present

## 2021-08-25 DIAGNOSIS — N390 Urinary tract infection, site not specified: Secondary | ICD-10-CM | POA: Diagnosis not present

## 2021-08-25 DIAGNOSIS — N179 Acute kidney failure, unspecified: Secondary | ICD-10-CM | POA: Diagnosis not present

## 2021-08-25 DIAGNOSIS — Z8673 Personal history of transient ischemic attack (TIA), and cerebral infarction without residual deficits: Secondary | ICD-10-CM | POA: Diagnosis not present

## 2021-08-25 DIAGNOSIS — E782 Mixed hyperlipidemia: Secondary | ICD-10-CM | POA: Diagnosis not present

## 2021-08-25 DIAGNOSIS — I129 Hypertensive chronic kidney disease with stage 1 through stage 4 chronic kidney disease, or unspecified chronic kidney disease: Secondary | ICD-10-CM | POA: Diagnosis not present

## 2021-08-25 DIAGNOSIS — Z9181 History of falling: Secondary | ICD-10-CM | POA: Diagnosis not present

## 2021-08-25 DIAGNOSIS — N1832 Chronic kidney disease, stage 3b: Secondary | ICD-10-CM | POA: Diagnosis not present

## 2021-08-25 DIAGNOSIS — Z7982 Long term (current) use of aspirin: Secondary | ICD-10-CM | POA: Diagnosis not present

## 2021-08-31 DIAGNOSIS — N1832 Chronic kidney disease, stage 3b: Secondary | ICD-10-CM | POA: Diagnosis not present

## 2021-08-31 DIAGNOSIS — Z9181 History of falling: Secondary | ICD-10-CM | POA: Diagnosis not present

## 2021-08-31 DIAGNOSIS — Z86711 Personal history of pulmonary embolism: Secondary | ICD-10-CM | POA: Diagnosis not present

## 2021-08-31 DIAGNOSIS — E039 Hypothyroidism, unspecified: Secondary | ICD-10-CM | POA: Diagnosis not present

## 2021-08-31 DIAGNOSIS — N179 Acute kidney failure, unspecified: Secondary | ICD-10-CM | POA: Diagnosis not present

## 2021-08-31 DIAGNOSIS — Z7982 Long term (current) use of aspirin: Secondary | ICD-10-CM | POA: Diagnosis not present

## 2021-08-31 DIAGNOSIS — R7302 Impaired glucose tolerance (oral): Secondary | ICD-10-CM | POA: Diagnosis not present

## 2021-08-31 DIAGNOSIS — E782 Mixed hyperlipidemia: Secondary | ICD-10-CM | POA: Diagnosis not present

## 2021-08-31 DIAGNOSIS — N390 Urinary tract infection, site not specified: Secondary | ICD-10-CM | POA: Diagnosis not present

## 2021-08-31 DIAGNOSIS — Z8673 Personal history of transient ischemic attack (TIA), and cerebral infarction without residual deficits: Secondary | ICD-10-CM | POA: Diagnosis not present

## 2021-08-31 DIAGNOSIS — Z79891 Long term (current) use of opiate analgesic: Secondary | ICD-10-CM | POA: Diagnosis not present

## 2021-08-31 DIAGNOSIS — I129 Hypertensive chronic kidney disease with stage 1 through stage 4 chronic kidney disease, or unspecified chronic kidney disease: Secondary | ICD-10-CM | POA: Diagnosis not present

## 2021-08-31 DIAGNOSIS — M1990 Unspecified osteoarthritis, unspecified site: Secondary | ICD-10-CM | POA: Diagnosis not present

## 2021-09-02 DIAGNOSIS — R7302 Impaired glucose tolerance (oral): Secondary | ICD-10-CM | POA: Diagnosis not present

## 2021-09-02 DIAGNOSIS — Z79891 Long term (current) use of opiate analgesic: Secondary | ICD-10-CM | POA: Diagnosis not present

## 2021-09-02 DIAGNOSIS — Z9181 History of falling: Secondary | ICD-10-CM | POA: Diagnosis not present

## 2021-09-02 DIAGNOSIS — M1990 Unspecified osteoarthritis, unspecified site: Secondary | ICD-10-CM | POA: Diagnosis not present

## 2021-09-02 DIAGNOSIS — I129 Hypertensive chronic kidney disease with stage 1 through stage 4 chronic kidney disease, or unspecified chronic kidney disease: Secondary | ICD-10-CM | POA: Diagnosis not present

## 2021-09-02 DIAGNOSIS — Z8673 Personal history of transient ischemic attack (TIA), and cerebral infarction without residual deficits: Secondary | ICD-10-CM | POA: Diagnosis not present

## 2021-09-02 DIAGNOSIS — Z86711 Personal history of pulmonary embolism: Secondary | ICD-10-CM | POA: Diagnosis not present

## 2021-09-02 DIAGNOSIS — N390 Urinary tract infection, site not specified: Secondary | ICD-10-CM | POA: Diagnosis not present

## 2021-09-02 DIAGNOSIS — N1832 Chronic kidney disease, stage 3b: Secondary | ICD-10-CM | POA: Diagnosis not present

## 2021-09-02 DIAGNOSIS — N179 Acute kidney failure, unspecified: Secondary | ICD-10-CM | POA: Diagnosis not present

## 2021-09-02 DIAGNOSIS — Z7982 Long term (current) use of aspirin: Secondary | ICD-10-CM | POA: Diagnosis not present

## 2021-09-02 DIAGNOSIS — E782 Mixed hyperlipidemia: Secondary | ICD-10-CM | POA: Diagnosis not present

## 2021-09-02 DIAGNOSIS — E039 Hypothyroidism, unspecified: Secondary | ICD-10-CM | POA: Diagnosis not present

## 2021-09-08 DIAGNOSIS — E782 Mixed hyperlipidemia: Secondary | ICD-10-CM | POA: Diagnosis not present

## 2021-09-08 DIAGNOSIS — N1832 Chronic kidney disease, stage 3b: Secondary | ICD-10-CM | POA: Diagnosis not present

## 2021-09-08 DIAGNOSIS — N179 Acute kidney failure, unspecified: Secondary | ICD-10-CM | POA: Diagnosis not present

## 2021-09-08 DIAGNOSIS — Z86711 Personal history of pulmonary embolism: Secondary | ICD-10-CM | POA: Diagnosis not present

## 2021-09-08 DIAGNOSIS — Z9181 History of falling: Secondary | ICD-10-CM | POA: Diagnosis not present

## 2021-09-08 DIAGNOSIS — M1990 Unspecified osteoarthritis, unspecified site: Secondary | ICD-10-CM | POA: Diagnosis not present

## 2021-09-08 DIAGNOSIS — Z7982 Long term (current) use of aspirin: Secondary | ICD-10-CM | POA: Diagnosis not present

## 2021-09-08 DIAGNOSIS — R7302 Impaired glucose tolerance (oral): Secondary | ICD-10-CM | POA: Diagnosis not present

## 2021-09-08 DIAGNOSIS — Z79891 Long term (current) use of opiate analgesic: Secondary | ICD-10-CM | POA: Diagnosis not present

## 2021-09-08 DIAGNOSIS — N390 Urinary tract infection, site not specified: Secondary | ICD-10-CM | POA: Diagnosis not present

## 2021-09-08 DIAGNOSIS — Z8673 Personal history of transient ischemic attack (TIA), and cerebral infarction without residual deficits: Secondary | ICD-10-CM | POA: Diagnosis not present

## 2021-09-08 DIAGNOSIS — E039 Hypothyroidism, unspecified: Secondary | ICD-10-CM | POA: Diagnosis not present

## 2021-09-08 DIAGNOSIS — I129 Hypertensive chronic kidney disease with stage 1 through stage 4 chronic kidney disease, or unspecified chronic kidney disease: Secondary | ICD-10-CM | POA: Diagnosis not present

## 2021-09-09 ENCOUNTER — Encounter: Payer: Self-pay | Admitting: Cardiology

## 2021-09-09 ENCOUNTER — Ambulatory Visit: Payer: Medicare Other | Admitting: Cardiology

## 2021-09-09 VITALS — BP 126/64 | HR 64 | Ht 64.0 in | Wt 158.0 lb

## 2021-09-09 DIAGNOSIS — E782 Mixed hyperlipidemia: Secondary | ICD-10-CM

## 2021-09-09 DIAGNOSIS — R002 Palpitations: Secondary | ICD-10-CM | POA: Diagnosis not present

## 2021-09-09 DIAGNOSIS — I10 Essential (primary) hypertension: Secondary | ICD-10-CM | POA: Diagnosis not present

## 2021-09-09 MED ORDER — ROSUVASTATIN CALCIUM 40 MG PO TABS: 40.0000 mg | ORAL_TABLET | Freq: Every day | ORAL | 3 refills | Status: DC

## 2021-09-09 NOTE — Progress Notes (Signed)
Clinical Summary Ms. Situ is a 87 y.o.female seen today for follow up of the following medical problems.    1. Palpitations - prior monitor showed PACs, PVCs, short runs of SVT longest 11 seconds  -severe allergy to monitor.    - taking lopressor '100mg'$  bid and dilt '30mg'$  bid - rare infrequent palpitatinos, improved quite a bit after starting diltiazem.       2. HTN - compliant with meds - she is compliant with meds     3. Unprovoked Pulmonary embolus 04/26/18 -  followed by intracranial hemorrhage on eliquis which was stopped and she's now on ASA 325 mg daily.   4. Hyperlipidemia - 09/2019 TC 130 HDL 46 TG 107 LDL 65 - 12/2020 TC 122 TG 43 HDL 52 LDL 61 - 07/2021 TC 144 TG 28 HDL 62 LDL 76 - she is on atorvastatin     5. History of seizures - 12/2020 admitted with AMS, thought to be postictal from seizure  6. TIA - changed from ASA to plavix 07/2021  Past Medical History:  Diagnosis Date   Allergy    Arthritis    Colon cancer ([place])    colon ca dx 07/30/09   History of cardiac monitoring 07/2017   "Event monitor demonstrated sinus rhythm with isolated PACs and no arrhythmias"   History of colon cancer 06/2009   found at time of TCS 06/29/09, 1.2cm sessile cecal polyp, no adjuvent therapy needed   HTN (hypertension)    Hx of cardiovascular stress test 07/2017   "No diagnostic ST segment changes to indicate ischemia. Small, moderate intensity, reversible apical to basal inferolateral defect consistent with ischemia. This is a low risk study. Nuclear stress EF: 84%."   Hyperlipidemia    Hypothyroidism    PE (pulmonary thromboembolism) ([place])    Renal disorder    cyst on kidney    Silent micro-hemorrhage of brain ([place]) 11/25/2018   Stroke ([place])    TIA (transient ischemic attack) 11/25/2018   Vertigo      Allergies  Allergen Reactions   Tape Rash    Zio monitor adhesive causes ulcerated and infected skin .Had to see derm     Current Outpatient  Medications  Medication Sig Dispense Refill   acetaminophen (TYLENOL) 500 MG tablet Take 1,000 mg by mouth every 8 (eight) hours as needed for mild pain or headache.     amLODipine (NORVASC) 5 MG tablet Take 1 tablet (5 mg total) by mouth daily. 90 tablet 3   ascorbic acid (VITAMIN C) 250 MG CHEW Chew 250 mg by mouth daily.     ASPERCREME LIDOCAINE EX Apply 1 application topically daily as needed (for knee pain).      aspirin EC 81 MG tablet Take 1 tablet (81 mg total) by mouth daily. X 20 days 30 tablet 0   atorvastatin (LIPITOR) 80 MG tablet TAKE 1 TABLET BY MOUTH ONCE A DAY. 90 tablet 0   cefdinir (OMNICEF) 300 MG capsule Take 1 capsule (300 mg total) by mouth every 12 (twelve) hours. 6 capsule 0   cholecalciferol (VITAMIN D3) 25 MCG (1000 UT) tablet Take 1,000 Units by mouth daily.      clopidogrel (PLAVIX) 75 MG tablet Take 1 tablet (75 mg total) by mouth daily. 30 tablet 2   diltiazem (CARDIZEM) 30 MG tablet Take 1 tablet (30 mg total) by mouth 2 (two) times daily. 180 tablet 3   gabapentin (NEURONTIN) 300 MG capsule Take 300 mg by mouth 3 (  three) times daily.     levETIRAcetam (KEPPRA) 500 MG tablet Take 250 mg by mouth 2 (two) times daily.     LINZESS 72 MCG capsule TAKE (1) CAPSULE BY MOUTH DAILY AS NEEDED FOR CONSTIPATION. (Patient taking differently: Take 72 mcg by mouth every other day.) 90 capsule 3   loratadine (CLARITIN) 10 MG tablet Take 10 mg by mouth daily.      meclizine (ANTIVERT) 25 MG tablet Take 1 tablet (25 mg total) by mouth 3 (three) times daily as needed for dizziness. 15 tablet 0   metoprolol tartrate (LOPRESSOR) 50 MG tablet Take 2 tablets (100 mg total) by mouth 2 (two) times daily. 360 tablet 3   RESTASIS 0.05 % ophthalmic emulsion Place 1 drop into both eyes 2 (two) times daily as needed (chronic dry eye).     topiramate (TOPAMAX) 50 MG tablet Take 50 mg by mouth 2 (two) times daily.     traMADol (ULTRAM) 50 MG tablet Take 25 mg by mouth every 8 (eight) hours as  needed for moderate pain.     No current facility-administered medications for this visit.     Past Surgical History:  Procedure Laterality Date   ABDOMINAL HYSTERECTOMY     COLON SURGERY  07/2009   right hemicolectomy, no residual colon cancer on path   COLONOSCOPY  07/16/2010   TIW:[id number] POLYP-TCS 3 YEARS   COLONOSCOPY N/A 08/02/2013   hyperplastic polyps, surveillance in 2020 if benefits outweight the risks   COLONOSCOPY  06/2009   1.2 cm sessile cecal polyp which had adenocarcinoma arising in a tubular adenoma.   PARTIAL THYMECTOMY     partial thyroidectomy     benign tumors     Allergies  Allergen Reactions   Tape Rash    Zio monitor adhesive causes ulcerated and infected skin .Had to see derm      Family History  Problem Relation Age of Onset   Colon cancer Mother        >[id number]   Arthritis Mother    Cancer Mother    Heart disease Mother    Hyperlipidemia Mother    Hypertension Mother    Heart attack Father    Heart disease Father    Diabetes Maternal Aunt    Hyperlipidemia Daughter    Hypertension Daughter    Liver disease Neg Hx      Social History Ms. Commons reports that she has never smoked. She has never used smokeless tobacco. Ms. Fifield reports no history of alcohol use.   Review of Systems CONSTITUTIONAL: No weight loss, fever, chills, weakness or fatigue.  HEENT: Eyes: No visual loss, blurred vision, double vision or yellow sclerae.No hearing loss, sneezing, congestion, runny nose or sore throat.  SKIN: No rash or itching.  CARDIOVASCULAR: per hpi RESPIRATORY: No shortness of breath, cough or sputum.  GASTROINTESTINAL: No anorexia, nausea, vomiting or diarrhea. No abdominal pain or blood.  GENITOURINARY: No burning on urination, no polyuria NEUROLOGICAL: No headache, dizziness, syncope, paralysis, ataxia, numbness or tingling in the extremities. No change in bowel or bladder control.  MUSCULOSKELETAL: No muscle, back pain,  joint pain or stiffness.  LYMPHATICS: No enlarged nodes. No history of splenectomy.  PSYCHIATRIC: No history of depression or anxiety.  ENDOCRINOLOGIC: No reports of sweating, cold or heat intolerance. No polyuria or polydipsia.  Marland Kitchen   Physical Examination Today's Vitals   09/09/21 1028  BP: 126/64  Pulse: 64  SpO2: 97%  Weight: 158 lb (71.7 kg)  Height: '5\' 4"'$  (  1.626 m)   Body mass index is 27.12 kg/m.  Gen: resting comfortably, no acute distress HEENT: no scleral icterus, pupils equal round and reactive, no palptable cervical adenopathy,  CV: RRR, no m/r/g, no jvd Resp: Clear to auscultation bilaterally GI: abdomen is soft, non-tender, non-distended, normal bowel sounds, no hepatosplenomegaly MSK: extremities are warm, no edema.  Skin: warm, no rash Neuro:  no focal deficits Psych: appropriate affect   Diagnostic Studies   07/2021 echo 1. Left ventricular ejection fraction, by estimation, is 65 to 70%. The  left ventricle has normal function. The left ventricle has no regional  wall motion abnormalities. There is mild concentric left ventricular  hypertrophy. Left ventricular diastolic  parameters are consistent with Grade I diastolic dysfunction (impaired  relaxation).   2. Right ventricular systolic function is normal. The right ventricular  size is normal. There is mildly elevated pulmonary artery systolic  pressure. The estimated right ventricular systolic pressure is 07.8 mmHg.   3. The mitral valve is grossly normal. Trivial mitral valve  regurgitation.   4. The aortic valve is tricuspid. Aortic valve regurgitation is not  visualized. Aortic valve sclerosis is present, with no evidence of aortic  valve stenosis. Aortic valve mean gradient measures 3.0 mmHg.   5. The inferior vena cava is normal in size with greater than 50%  respiratory variability, suggesting right atrial pressure of 3 mmHg.   Assessment and Plan  1. Palpitations - prior monitor showed  PAC/PVCs and SVT - doing well on current meds, continue   2. HTN -she is at goal. Since on dilt will d/c amlodopine.    3. Hyperlipdiemia - given ICA disease and recent TIA would be more aggressive with LDL lowering to goal <70 - d/c atorvastatin, start crestor '40mg'$  daily   F/u 6 months      Arnoldo Lenis, M.D.

## 2021-09-09 NOTE — Patient Instructions (Signed)
Medication Instructions:  Your physician has recommended you make the following change in your medication:  Stop Amlodipine Stop Atorvastatin Start Crestor 40 mg tablets daily   Labwork: None  Testing/Procedures: None  Follow-Up: Follow up with Dr. Harl Bowie in 6 months.   Any Other Special Instructions Will Be Listed Below (If Applicable).     If you need a refill on your cardiac medications before your next appointment, please call your pharmacy.

## 2021-09-25 ENCOUNTER — Encounter: Payer: Self-pay | Admitting: Cardiology

## 2021-09-27 ENCOUNTER — Other Ambulatory Visit: Payer: Self-pay

## 2021-09-27 MED ORDER — METOPROLOL SUCCINATE ER 100 MG PO TB24: 100.0000 mg | ORAL_TABLET | Freq: Two times a day (BID) | ORAL | 3 refills | Status: DC

## 2021-09-27 NOTE — Telephone Encounter (Signed)
Clarify taking lopressor '100mg'$  bid, if so lets try changing her to toprol '100mg'$  bid. This is a longer acting version and may work better for her since keeps more medication in her system throughout the day and night  Zandra Abts MD

## 2021-10-30 ENCOUNTER — Encounter: Payer: Self-pay | Admitting: Cardiology

## 2021-11-01 ENCOUNTER — Other Ambulatory Visit: Payer: Self-pay | Admitting: Cardiology

## 2021-11-01 ENCOUNTER — Encounter: Payer: Self-pay | Admitting: Cardiology

## 2021-11-08 DIAGNOSIS — I129 Hypertensive chronic kidney disease with stage 1 through stage 4 chronic kidney disease, or unspecified chronic kidney disease: Secondary | ICD-10-CM | POA: Diagnosis not present

## 2021-11-08 DIAGNOSIS — E1122 Type 2 diabetes mellitus with diabetic chronic kidney disease: Secondary | ICD-10-CM | POA: Diagnosis not present

## 2021-11-08 DIAGNOSIS — N189 Chronic kidney disease, unspecified: Secondary | ICD-10-CM | POA: Diagnosis not present

## 2021-11-08 DIAGNOSIS — I5032 Chronic diastolic (congestive) heart failure: Secondary | ICD-10-CM | POA: Diagnosis not present

## 2021-11-08 DIAGNOSIS — E870 Hyperosmolality and hypernatremia: Secondary | ICD-10-CM | POA: Diagnosis not present

## 2021-11-10 ENCOUNTER — Encounter: Payer: Self-pay | Admitting: Cardiology

## 2021-11-10 DIAGNOSIS — E870 Hyperosmolality and hypernatremia: Secondary | ICD-10-CM | POA: Diagnosis not present

## 2021-11-10 DIAGNOSIS — E1122 Type 2 diabetes mellitus with diabetic chronic kidney disease: Secondary | ICD-10-CM | POA: Diagnosis not present

## 2021-11-10 DIAGNOSIS — N189 Chronic kidney disease, unspecified: Secondary | ICD-10-CM | POA: Diagnosis not present

## 2021-11-10 DIAGNOSIS — I5032 Chronic diastolic (congestive) heart failure: Secondary | ICD-10-CM | POA: Diagnosis not present

## 2021-11-10 DIAGNOSIS — I129 Hypertensive chronic kidney disease with stage 1 through stage 4 chronic kidney disease, or unspecified chronic kidney disease: Secondary | ICD-10-CM | POA: Diagnosis not present

## 2021-11-10 NOTE — Telephone Encounter (Signed)
Ok to refill plavix though started for neurological indication  J Nicole Nissen MD

## 2021-11-10 NOTE — Telephone Encounter (Signed)
Which old medication was she taking extra of. I would say that she could take an extra '30mg'$  of diltiazem as needed. She is on pretty good daily dose of metoprolol if that's the extra she was taking  Zandra Abts MD

## 2021-11-11 DIAGNOSIS — H04123 Dry eye syndrome of bilateral lacrimal glands: Secondary | ICD-10-CM | POA: Diagnosis not present

## 2021-11-22 ENCOUNTER — Ambulatory Visit (INDEPENDENT_AMBULATORY_CARE_PROVIDER_SITE_OTHER): Payer: Medicare Other

## 2021-11-22 DIAGNOSIS — Z0000 Encounter for general adult medical examination without abnormal findings: Secondary | ICD-10-CM | POA: Diagnosis not present

## 2021-11-22 NOTE — Patient Instructions (Signed)
  [name] , Thank you for taking time to come for your Medicare Wellness Visit. I appreciate your ongoing commitment to your health goals. Please review the following plan we discussed and let me know if I can assist you in the future.  HAVE A WONDERFUL BIRTHDAY!!  You can get your Shingles vaccine and Tdap vaccine at the pharmacy.  You can get your flu vaccine at your next appt with Dr Posey Pronto, or we are doing them as walk in's on Wed's from 8-12 the month of Sept.    These are the goals we discussed:  Goals      Patient Stated     Do stretching exercises daily      Prevent falls        This is a list of the screening recommended for you and due dates:  Health Maintenance  Topic Date Due   Tetanus Vaccine  Never done   Zoster (Shingles) Vaccine (1 of 2) Never done   COVID-19 Vaccine (4 - Mixed Product risk series) 11/10/2020   Flu Shot  10/12/2021   Pneumonia Vaccine  Completed   DEXA scan (bone density measurement)  Completed   HPV Vaccine  Aged Out   Urine Protein Check  Discontinued

## 2021-11-22 NOTE — Progress Notes (Signed)
I connected with  Renae Fickle on 11/22/21 by a audio enabled telemedicine application and verified that I am speaking with the correct person using two identifiers.  Patient Location: Home  Provider Location: Home Office  I discussed the limitations of evaluation and management by telemedicine. The patient expressed understanding and agreed to proceed.   Subjective:   [name] is a 87 y.o. female who presents for Medicare Annual (Subsequent) preventive examination.  Review of Systems     Cardiac Risk Factors include: hypertension;advanced age (>45mn, >>22women);sedentary lifestyle;dyslipidemia     Objective:    There were no vitals filed for this visit. There is no height or weight on file to calculate BMI.     11/22/2021    2:58 PM 08/02/2021    7:36 PM 02/15/2021    3:54 PM 01/06/2021    4:39 PM 11/13/2020    2:36 PM 11/02/2020    7:59 PM 08/11/2020    3:46 PM  Advanced Directives  Does Patient Have a Medical Advance Directive? Yes No;Yes Yes No Yes No;Yes No  Type of ACorporate treasurerof [place]Living will [place] will  [place] will    Does patient want to make changes to medical advance directive? No - Patient declined  No - Patient declined   No - Patient declined   Copy of HO[place] Chart?   No - copy requested      Would patient like information on creating a medical advance directive?       No - Patient declined    Current Medications (verified) Outpatient Encounter Medications as of 11/22/2021  Medication Sig   acetaminophen (TYLENOL) 500 MG tablet Take 1,000 mg by mouth every 8 (eight) hours as needed for mild pain or headache.   ascorbic acid (VITAMIN C) 250 MG CHEW Chew 250 mg by mouth daily.   ASPERCREME LIDOCAINE EX Apply 1 application topically daily as needed (for knee pain).    cholecalciferol (VITAMIN D3) 25 MCG (1000 UT) tablet Take 1,000  Units by mouth daily.    clopidogrel (PLAVIX) 75 MG tablet TAKE ONE TABLET BY MOUTH ONCE DAILY.   diltiazem (CARDIZEM) 30 MG tablet Take 1 tablet (30 mg total) by mouth 2 (two) times daily.   gabapentin (NEURONTIN) 300 MG capsule Take 300 mg by mouth 3 (three) times daily.   levETIRAcetam (KEPPRA) 500 MG tablet Take 250 mg by mouth 2 (two) times daily.   LINZESS 72 MCG capsule TAKE (1) CAPSULE BY MOUTH DAILY AS NEEDED FOR CONSTIPATION. (Patient taking differently: Take 72 mcg by mouth every other day.)   loratadine (CLARITIN) 10 MG tablet Take 10 mg by mouth daily.    meclizine (ANTIVERT) 25 MG tablet Take 1 tablet (25 mg total) by mouth 3 (three) times daily as needed for dizziness.   metoprolol succinate (TOPROL-XL) 100 MG 24 hr tablet Take 1 tablet (100 mg total) by mouth in the morning and at bedtime. Take with or immediately following a meal.   RESTASIS 0.05 % ophthalmic emulsion Place 1 drop into both eyes 2 (two) times daily as needed (chronic dry eye).   rosuvastatin (CRESTOR) 40 MG tablet Take 1 tablet (40 mg total) by mouth daily.   topiramate (TOPAMAX) 50 MG tablet Take 50 mg by mouth 2 (two) times daily.   traMADol (ULTRAM) 50 MG tablet Take 25 mg by mouth every 8 (eight) hours as needed for moderate pain.  No facility-administered encounter medications on file as of 11/22/2021.    Allergies (verified) Tape   History: Past Medical History:  Diagnosis Date   Allergy    Arthritis    Colon cancer ([place])    colon ca dx 07/30/09   History of cardiac monitoring 07/2017   "Event monitor demonstrated sinus rhythm with isolated PACs and no arrhythmias"   History of colon cancer 06/2009   found at time of TCS 06/29/09, 1.2cm sessile cecal polyp, no adjuvent therapy needed   HTN (hypertension)    Hx of cardiovascular stress test 07/2017   "No diagnostic ST segment changes to indicate ischemia. Small, moderate intensity, reversible apical to basal inferolateral defect consistent with  ischemia. This is a low risk study. Nuclear stress EF: 84%."   Hyperlipidemia    Hypothyroidism    PE (pulmonary thromboembolism) ([place])    Renal disorder    cyst on kidney    Silent micro-hemorrhage of brain ([place]) 11/25/2018   Stroke [facility])    TIA (transient ischemic attack) 11/25/2018   Vertigo    Past Surgical History:  Procedure Laterality Date   ABDOMINAL HYSTERECTOMY     COLON SURGERY  07/2009   right hemicolectomy, no residual colon cancer on path   COLONOSCOPY  07/16/2010   [id number] POLYP-TCS 3 YEARS   COLONOSCOPY N/A 08/02/2013   hyperplastic polyps, surveillance in 2020 if benefits outweight the risks   COLONOSCOPY  06/2009   1.2 cm sessile cecal polyp which had adenocarcinoma arising in a tubular adenoma.   PARTIAL THYMECTOMY     partial thyroidectomy     benign tumors   Family History  Problem Relation Age of Onset   Colon cancer Mother        >age38   Arthritis Mother    Cancer Mother    Heart disease Mother    Hyperlipidemia Mother    Hypertension Mother    Heart attack Father    Heart disease Father    Diabetes Maternal Aunt    Hyperlipidemia Daughter    Hypertension Daughter    Liver disease Neg Hx    Social History   Socioeconomic History   Marital status: Widowed    Spouse name: Not on file   Number of children: 2   Years of education: Not on file   Highest education level: Not on file  Occupational History   Occupation: Psychologist, 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   Occupation: retired from [employer]  Tobacco Use   Smoking status: Never   Smokeless tobacco: Never  Vaping Use   Vaping Use: Never used  Substance and Sexual Activity   Alcohol use: No    Alcohol/week: 0.0 standard drinks of alcohol   Drug use: No   Sexual activity: Not Currently  Other Topics Concern   Not on file  Social History Narrative   Not on file   Social Determinants of Health   Financial Resource Strain: Low Risk  (11/13/2020)   Overall Financial Resource Strain  (CARDIA)    Difficulty of Paying Living Expenses: Not very hard  Food Insecurity: No Food Insecurity (11/22/2021)   Hunger Vital Sign    Worried About Running Out of Food in the Last Year: Never true    Ran Out of Food in the Last Year: Never true  Transportation Needs: No Transportation Needs (11/13/2020)   PRAPARE - Hydrologist (Medical): No    Lack of Transportation (Non-Medical): No  Physical Activity:  Insufficiently Active (11/22/2021)   Exercise Vital Sign    Days of Exercise per Week: 4 days    Minutes of Exercise per Session: 10 min  Stress: No Stress Concern Present (11/13/2020)   [place]    Feeling of Stress : Only a little  Social Connections: Moderately Isolated (11/13/2020)   Social Connection and Isolation Panel [NHANES]    Frequency of Communication with Friends and Family: More than three times a week    Frequency of Social Gatherings with Friends and Family: More than three times a week    Attends Religious Services: More than 4 times per year    Active Member of [employer] or Organizations: No    Attends Archivist Meetings: Never    Marital Status: Widowed    Tobacco Counseling Counseling given: Not Answered   Clinical Intake:  Pre-visit preparation completed: Yes        Diabetes: No     Diabetic?no         Activities of Daily Living    11/22/2021    3:14 PM 08/02/2021   11:00 PM  In your present state of health, do you have any difficulty performing the following activities:  Hearing? 0 0  Vision? 0 0  Difficulty concentrating or making decisions? 0 0  Walking or climbing stairs? 1 0  Dressing or bathing? 0 0  Doing errands, shopping? 0 0  Preparing Food and eating ? N   Using the Toilet? N   In the past six months, have you accidently leaked urine? N   Do you have problems with loss of bowel control? N   Managing your Medications? Y    Comment daughter Education officer, community your Finances? N   Housekeeping or managing your Housekeeping? N     Patient Care Team: Lindell Spar, MD as PCP - General (Internal Medicine) Herminio Commons, MD (Inactive) as PCP - Cardiology (Cardiology) Danie Binder, MD (Inactive) (Gastroenterology)  Indicate any recent Medical Services you may have received from other than [facility] providers in the past year (date may be approximate).     Assessment:   This is a routine wellness examination for Eliani.  Hearing/Vision screen No results found.  Dietary issues and exercise activities discussed: Current Exercise Habits: Home exercise routine, Time (Minutes): 10, Frequency (Times/Week): 4, Weekly Exercise (Minutes/Week): 40   Goals Addressed             This Visit's Progress    Patient Stated       Do stretching exercises daily      Prevent falls         Depression Screen    11/22/2021    3:12 PM 08/06/2021    2:48 PM 07/19/2021    3:15 PM 12/15/2020    3:56 PM 11/13/2020    2:34 PM 11/09/2020    2:04 PM 08/12/2020    3:49 PM  PHQ 2/9 Scores  PHQ - 2 Score 0 0 0 0 0 0 0    Fall Risk    11/22/2021    3:12 PM 08/06/2021    2:48 PM 07/19/2021    3:15 PM 12/15/2020    3:56 PM 11/13/2020    2:37 PM  [place] in the past year? 0 0 0 0 0  Number falls in past yr: 0 0 0 0 0  Injury with Fall? 0 0 0 0 0  Risk  for fall due to :  No Fall Risks No Fall Risks No Fall Risks Impaired mobility  Follow up  Falls evaluation completed Falls evaluation completed Falls evaluation completed Falls evaluation completed    [place]:  Any stairs in or around the home? No  If so, are there any without handrails? No  Home free of loose throw rugs in walkways, pet beds, electrical cords, etc? Yes  Adequate lighting in your home to reduce risk of falls? Yes   ASSISTIVE DEVICES UTILIZED TO PREVENT FALLS:  Life alert? No  Use of a cane, walker or  w/c? Yes  Grab bars in the bathroom? Yes  Shower chair or bench in shower? Yes  Elevated toilet seat or a handicapped toilet? No    Cognitive Function:        11/22/2021    3:14 PM 11/13/2020    2:39 PM  6CIT Screen  What Year? 0 points 0 points  What month? 0 points 0 points  What time? 0 points 0 points  Count back from 20 0 points 0 points  Months in reverse 4 points 2 points  Repeat phrase 4 points 4 points  Total Score 8 points 6 points    Immunizations Immunization History  Administered Date(s) Administered   Fluad Quad(high Dose 65+) 11/16/2018, 12/18/2019, 12/15/2020   Influenza, High Dose Seasonal PF 12/14/2017   Influenza,inj,Quad PF,6+ Mos 12/15/2015   Moderna SARS-COV2 Booster Vaccination 09/15/2020   Moderna Sars-Covid-2 Vaccination 04/20/2019, 05/21/2019   PFIZER(Purple Top)SARS-COV-2 Vaccination 02/10/2020   PNEUMOCOCCAL CONJUGATE-20 01/13/2021   Zoster, Live 06/18/2014    TDAP status: Due, Education has been provided regarding the importance of this vaccine. Advised may receive this vaccine at local pharmacy or Health Dept. Aware to provide a copy of the vaccination record if obtained from local pharmacy or Health Dept. Verbalized acceptance and understanding.  Flu Vaccine status: Due, Education has been provided regarding the importance of this vaccine. Advised may receive this vaccine at local pharmacy or Health Dept. Aware to provide a copy of the vaccination record if obtained from local pharmacy or Health Dept. Verbalized acceptance and understanding.  Pneumococcal vaccine status: Up to date  Covid-19 vaccine status: Completed vaccines  Qualifies for Shingles Vaccine? Yes   Zostavax completed Yes   Shingrix Completed?: No.    Education has been provided regarding the importance of this vaccine. Patient has been advised to call insurance company to determine out of pocket expense if they have not yet received this vaccine. Advised may also receive  vaccine at local pharmacy or Health Dept. Verbalized acceptance and understanding.  Screening Tests Health Maintenance  Topic Date Due   TETANUS/TDAP  Never done   Zoster Vaccines- Shingrix (1 of 2) Never done   COVID-19 Vaccine (4 - Mixed Product risk series) 11/10/2020   INFLUENZA VACCINE  10/12/2021   Pneumonia Vaccine 77+ Years old  Completed   DEXA SCAN  Completed   HPV VACCINES  Aged Out   URINE MICROALBUMIN  Discontinued    Health Maintenance  Health Maintenance Due  Topic Date Due   TETANUS/TDAP  Never done   Zoster Vaccines- Shingrix (1 of 2) Never done   COVID-19 Vaccine (4 - Mixed Product risk series) 11/10/2020   INFLUENZA VACCINE  10/12/2021    Colorectal cancer screening: No longer required.   Mammogram status: No longer required due to age.  Bone density no longer needed  Lung Cancer Screening: (Low Dose CT Chest  recommended if Age 45-80 years, 89 pack-year currently smoking OR have quit w/in 15years.) does not qualify.   Lung Cancer Screening Referral: na  Additional Screening:  Hepatitis C Screening: does not qualify;   Vision Screening: Recommended annual ophthalmology exams for early detection of glaucoma and other disorders of the eye. Is the patient up to date with their annual eye exam?  No  Who is the provider or what is the name of the office in which the patient attends annual eye exams?  If pt is not established with a provider, would they like to be referred to a provider to establish care? No .   Dental Screening: Recommended annual dental exams for proper oral hygiene  Community Resource Referral / Chronic Care Management: CRR required this visit?  No   CCM required this visit?  No      Plan:     I have personally reviewed and noted the following in the patient's chart:   Medical and social history Use of alcohol, tobacco or illicit drugs  Current medications and supplements including opioid prescriptions. Patient is not  currently taking opioid prescriptions. Functional ability and status Nutritional status Physical activity Advanced directives List of other physicians Hospitalizations, surgeries, and ER visits in previous 12 months Vitals Screenings to include cognitive, depression, and falls Referrals and appointments  In addition, I have reviewed and discussed with patient certain preventive protocols, quality metrics, and best practice recommendations. A written personalized care plan for preventive services as well as general preventive health recommendations were provided to patient.     Eual Fines, LPN   10/26/9468   Nurse Notes: Schedule your next wellness visit for 1 year at checkout

## 2021-11-23 DIAGNOSIS — G890 Central pain syndrome: Secondary | ICD-10-CM | POA: Diagnosis not present

## 2021-11-23 DIAGNOSIS — M542 Cervicalgia: Secondary | ICD-10-CM | POA: Diagnosis not present

## 2021-11-23 DIAGNOSIS — G4701 Insomnia due to medical condition: Secondary | ICD-10-CM | POA: Diagnosis not present

## 2021-11-23 DIAGNOSIS — I6930 Unspecified sequelae of cerebral infarction: Secondary | ICD-10-CM | POA: Diagnosis not present

## 2021-11-24 NOTE — Telephone Encounter (Signed)
Please adjust her diltiazem to '30mg'$  bid may take additional '30mg'$  prn for palpitaitons   Zandra Abts MD

## 2021-11-25 ENCOUNTER — Telehealth: Payer: Self-pay

## 2021-11-25 MED ORDER — DILTIAZEM HCL 30 MG PO TABS: 30.0000 mg | ORAL_TABLET | Freq: Two times a day (BID) | ORAL | 3 refills | Status: DC

## 2021-11-25 NOTE — Telephone Encounter (Signed)
Directions for diltiazem 30 mg changed per Dr. Harl Bowie:  Diltiazem 30 mg by mouth twice a day. May take additional 30 mg PRN for palpitations.

## 2021-11-27 ENCOUNTER — Other Ambulatory Visit: Payer: Self-pay | Admitting: Cardiology

## 2021-12-14 DIAGNOSIS — H04123 Dry eye syndrome of bilateral lacrimal glands: Secondary | ICD-10-CM | POA: Diagnosis not present

## 2022-01-20 ENCOUNTER — Encounter: Payer: Self-pay | Admitting: Internal Medicine

## 2022-01-20 ENCOUNTER — Ambulatory Visit (INDEPENDENT_AMBULATORY_CARE_PROVIDER_SITE_OTHER): Payer: Medicare Other | Admitting: Internal Medicine

## 2022-01-20 VITALS — BP 122/78 | HR 67 | Ht 64.0 in | Wt 154.8 lb

## 2022-01-20 DIAGNOSIS — I10 Essential (primary) hypertension: Secondary | ICD-10-CM

## 2022-01-20 DIAGNOSIS — G8194 Hemiplegia, unspecified affecting left nondominant side: Secondary | ICD-10-CM | POA: Diagnosis not present

## 2022-01-20 DIAGNOSIS — E782 Mixed hyperlipidemia: Secondary | ICD-10-CM

## 2022-01-20 DIAGNOSIS — R7303 Prediabetes: Secondary | ICD-10-CM | POA: Diagnosis not present

## 2022-01-20 DIAGNOSIS — Z0001 Encounter for general adult medical examination with abnormal findings: Secondary | ICD-10-CM

## 2022-01-20 DIAGNOSIS — N1832 Chronic kidney disease, stage 3b: Secondary | ICD-10-CM | POA: Diagnosis not present

## 2022-01-20 DIAGNOSIS — Z23 Encounter for immunization: Secondary | ICD-10-CM

## 2022-01-20 DIAGNOSIS — G40209 Localization-related (focal) (partial) symptomatic epilepsy and epileptic syndromes with complex partial seizures, not intractable, without status epilepticus: Secondary | ICD-10-CM | POA: Diagnosis not present

## 2022-01-20 NOTE — Progress Notes (Signed)
Established Patient Office Visit  Subjective:  Patient ID: [name], female    DOB: 1934-05-23  Age: 87 y.o. MRN: [id number]  CC:  Chief Complaint  Patient presents with   Annual Exam    CPE needs referral for new neurologist Dr Merlene Laughter is closing, flu shot     HPI [name] is a 87 y.o. female with past medical history of HTN, prediabetes, CVA with residual left sided numbness and weakness, ?partial seizure, hypothyroidism, colon ca. s/p right hemicolectomy, chronic constipation, PE and CKD stage 3 who presents for annual physical.  HTN: Her BP is well controlled today.  She is taking metoprolol and Cardizem for HTN and palpitations.  She follows up with cardiology for history of SVT.  She has intermittent palpitations, but denies any chest pain or dyspnea.  History of seizures and CVA: She used to see Dr. Merlene Laughter for it, but their office is closing.  She requests a new neurology referral now.  She is taking Keppra and topiramate for seizures.  She is on Plavix and statin for history of CVA.  HLD: She is placed on Crestor instead of atorvastatin by her Cardiologist.    Past Medical History:  Diagnosis Date   Allergy    Arthritis    Colon cancer ([place])    colon ca dx 07/30/09   History of cardiac monitoring 07/2017   "Event monitor demonstrated sinus rhythm with isolated PACs and no arrhythmias"   History of colon cancer 06/2009   found at time of TCS 06/29/09, 1.2cm sessile cecal polyp, no adjuvent therapy needed   HTN (hypertension)    Hx of cardiovascular stress test 07/2017   "No diagnostic ST segment changes to indicate ischemia. Small, moderate intensity, reversible apical to basal inferolateral defect consistent with ischemia. This is a low risk study. Nuclear stress EF: 84%."   Hyperlipidemia    Hypothyroidism    PE (pulmonary thromboembolism) ([place])    Renal disorder    cyst on kidney    Silent micro-hemorrhage of brain ([place]) 11/25/2018    Stroke [facility])    TIA (transient ischemic attack) 11/25/2018   Vertigo     Past Surgical History:  Procedure Laterality Date   ABDOMINAL HYSTERECTOMY     COLON SURGERY  07/2009   right hemicolectomy, no residual colon cancer on path   COLONOSCOPY  07/16/2010   DQQ:[id number] POLYP-TCS 3 YEARS   COLONOSCOPY N/A 08/02/2013   hyperplastic polyps, surveillance in 2020 if benefits outweight the risks   COLONOSCOPY  06/2009   1.2 cm sessile cecal polyp which had adenocarcinoma arising in a tubular adenoma.   PARTIAL THYMECTOMY     partial thyroidectomy     benign tumors    Family History  Problem Relation Age of Onset   Colon cancer Mother        >age49   Arthritis Mother    Cancer Mother    Heart disease Mother    Hyperlipidemia Mother    Hypertension Mother    Heart attack Father    Heart disease Father    Diabetes Maternal Aunt    Hyperlipidemia Daughter    Hypertension Daughter    Liver disease Neg Hx     Social History   Socioeconomic History   Marital status: Widowed    Spouse name: Not on file   Number of children: 2   Years of education: Not on file   Highest education level: Not on file  Occupational History  Occupation: Psychologist, 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   Occupation: retired from [employer]  Tobacco Use   Smoking status: Never   Smokeless tobacco: Never  Vaping Use   Vaping Use: Never used  Substance and Sexual Activity   Alcohol use: No    Alcohol/week: 0.0 standard drinks of alcohol   Drug use: No   Sexual activity: Not Currently  Other Topics Concern   Not on file  Social History Narrative   Not on file   Social Determinants of Health   Financial Resource Strain: Low Risk  (11/13/2020)   Overall Financial Resource Strain (CARDIA)    Difficulty of Paying Living Expenses: Not very hard  Food Insecurity: No Food Insecurity (11/22/2021)   Hunger Vital Sign    Worried About Running Out of Food in the Last Year: Never true    [place] in  the Last Year: Never true  Transportation Needs: No Transportation Needs (11/13/2020)   PRAPARE - Hydrologist (Medical): No    Lack of Transportation (Non-Medical): No  Physical Activity: Insufficiently Active (11/22/2021)   Exercise Vital Sign    Days of Exercise per Week: 4 days    Minutes of Exercise per Session: 10 min  Stress: No Stress Concern Present (11/13/2020)   [place]    Feeling of Stress : Only a little  Social Connections: Moderately Isolated (11/13/2020)   Social Connection and Isolation Panel [NHANES]    Frequency of Communication with Friends and Family: More than three times a week    Frequency of Social Gatherings with Friends and Family: More than three times a week    Attends Religious Services: More than 4 times per year    Active Member of [employer] or Organizations: No    Attends Archivist Meetings: Never    Marital Status: Widowed  Intimate Partner Violence: Not At Risk (11/13/2020)   Humiliation, Afraid, Rape, and Kick questionnaire    Fear of Current or Ex-Partner: No    Emotionally Abused: No    Physically Abused: No    Sexually Abused: No    Outpatient Medications Prior to Visit  Medication Sig Dispense Refill   acetaminophen (TYLENOL) 500 MG tablet Take 1,000 mg by mouth every 8 (eight) hours as needed for mild pain or headache.     ascorbic acid (VITAMIN C) 250 MG CHEW Chew 250 mg by mouth daily.     ASPERCREME LIDOCAINE EX Apply 1 application topically daily as needed (for knee pain).      cholecalciferol (VITAMIN D3) 25 MCG (1000 UT) tablet Take 1,000 Units by mouth daily.      clopidogrel (PLAVIX) 75 MG tablet TAKE ONE TABLET BY MOUTH ONCE DAILY. 90 tablet 1   diltiazem (CARDIZEM) 30 MG tablet Take 1 tablet (30 mg total) by mouth 2 (two) times daily. May take additional 30 mg tablet as needed for palpitations. 180 tablet 3   gabapentin (NEURONTIN) 300  MG capsule Take 300 mg by mouth 3 (three) times daily.     levETIRAcetam (KEPPRA) 500 MG tablet Take 250 mg by mouth 2 (two) times daily.     Lifitegrast (XIIDRA) 5 % SOLN Apply to eye.     LINZESS 72 MCG capsule TAKE (1) CAPSULE BY MOUTH DAILY AS NEEDED FOR CONSTIPATION. (Patient taking differently: Take 72 mcg by mouth every other day.) 90 capsule 3   loratadine (CLARITIN) 10 MG tablet  Take 10 mg by mouth daily.      meclizine (ANTIVERT) 25 MG tablet Take 1 tablet (25 mg total) by mouth 3 (three) times daily as needed for dizziness. 15 tablet 0   metoprolol succinate (TOPROL-XL) 100 MG 24 hr tablet Take 1 tablet (100 mg total) by mouth in the morning and at bedtime. Take with or immediately following a meal. 180 tablet 3   RESTASIS 0.05 % ophthalmic emulsion Place 1 drop into both eyes 2 (two) times daily as needed (chronic dry eye).     rosuvastatin (CRESTOR) 40 MG tablet Take 1 tablet (40 mg total) by mouth daily. 90 tablet 3   topiramate (TOPAMAX) 50 MG tablet Take 50 mg by mouth 2 (two) times daily.     traMADol (ULTRAM) 50 MG tablet Take 25 mg by mouth every 8 (eight) hours as needed for moderate pain.     No facility-administered medications prior to visit.    Allergies  Allergen Reactions   Tape Rash    Zio monitor adhesive causes ulcerated and infected skin .Had to see derm    ROS Review of Systems  Constitutional:  Negative for chills and fever.  HENT:  Negative for congestion, sinus pressure, sinus pain and sore throat.   Eyes:  Negative for pain and discharge.  Respiratory:  Negative for cough and shortness of breath.   Cardiovascular:  Positive for leg swelling. Negative for chest pain and palpitations.  Gastrointestinal:  Negative for abdominal pain, constipation, diarrhea, nausea and vomiting.  Endocrine: Negative for polydipsia and polyuria.  Genitourinary:  Negative for dysuria and hematuria.  Musculoskeletal:  Positive for arthralgias. Negative for neck pain and neck  stiffness.  Skin:  Negative for rash.  Neurological:  Negative for dizziness and weakness.  Psychiatric/Behavioral:  Negative for agitation and behavioral problems.       Objective:    Physical Exam Vitals reviewed.  Constitutional:      General: She is not in acute distress.    Appearance: She is not diaphoretic.  HENT:     Head: Normocephalic and atraumatic.     Nose: Nose normal.     Mouth/Throat:     Mouth: Mucous membranes are moist.  Eyes:     General: No scleral icterus.    Extraocular Movements: Extraocular movements intact.  Cardiovascular:     Rate and Rhythm: Normal rate and regular rhythm.     Pulses: Normal pulses.     Heart sounds: Normal heart sounds. No murmur heard. Pulmonary:     Breath sounds: Normal breath sounds. No wheezing or rales.  Abdominal:     Palpations: Abdomen is soft.     Tenderness: There is no abdominal tenderness.  Musculoskeletal:     Cervical back: Neck supple. No tenderness.     Right lower leg: No edema.     Left lower leg: No edema.  Skin:    General: Skin is warm.     Findings: No rash.  Neurological:     General: No focal deficit present.     Mental Status: She is alert and oriented to person, place, and time.     Sensory: Sensory deficit (Left UE) present.     Motor: Weakness (Left UE and LE - muscle strength 4/5) present.  Psychiatric:        Mood and Affect: Mood normal.        Behavior: Behavior normal.     BP 122/78 (BP Location: Left Arm, Patient Position: Sitting, Cuff  Size: Normal)   Pulse 67   Ht _0  (1.626 m)   Wt 154 lb 12.8 oz (70.2 kg)   SpO2 99%   BMI 26.57 kg/m  Wt Readings from Last 3 Encounters:  01/20/22 154 lb 12.8 oz (70.2 kg)  09/09/21 158 lb (71.7 kg)  08/06/21 158 lb (71.7 kg)    Lab Results  Component Value Date   TSH 0.357 01/06/2021   Lab Results  Component Value Date   WBC 4.7 08/06/2021   HGB 13.7 08/06/2021   HCT 41.3 08/06/2021   MCV 97 08/06/2021   PLT 266 08/06/2021    Lab Results  Component Value Date   NA 145 (H) 08/06/2021   K 4.3 08/06/2021   CHLORIDE 105 04/16/2015   CO2 23 08/06/2021   GLUCOSE 70 08/06/2021   BUN 21 08/06/2021   CREATININE 1.46 (H) 08/06/2021   BILITOT 0.3 08/02/2021   ALKPHOS 98 08/02/2021   AST 20 08/02/2021   ALT 20 08/02/2021   PROT 7.6 08/02/2021   ALBUMIN 4.2 08/02/2021   CALCIUM 9.4 08/06/2021   ANIONGAP 5 08/03/2021   EGFR 35 (L) 08/06/2021   Lab Results  Component Value Date   CHOL 144 08/03/2021   Lab Results  Component Value Date   HDL 62 08/03/2021   Lab Results  Component Value Date   LDLCALC 76 08/03/2021   Lab Results  Component Value Date   TRIG 28 08/03/2021   Lab Results  Component Value Date   CHOLHDL 2.3 08/03/2021   Lab Results  Component Value Date   HGBA1C 6.1 (H) 08/03/2021      Assessment & Plan:   Problem List Items Addressed This Visit       Cardiovascular and Mediastinum   Essential hypertension    BP Readings from Last 1 Encounters:  01/20/22 122/78  Well-controlled with Metoprolol and Cardizem Counseled for compliance with the medications Advised DASH diet and moderate exercise/walking as tolerated        Nervous and Auditory   Left hemiparesis (HCC)    H/o multiple TIAs On Plavix and statin F/u with Neurology      Relevant Orders   Ambulatory referral to Neurology     Genitourinary   Chronic kidney disease, stage 3b ([place])    Last CMP reviewed, CKD stage 3b, recently decline in GFR to 64s F/u with Nephrology Avoid nephrotoxic agents No proteinuria currently according to Nephrology chart        Other   Mixed hyperlipidemia    On Crestor now      Relevant Orders   Lipid Profile   Complex partial seizure ([place])    Denies any recent seizure-like activity Referred to [facility] neurology On Keppra and Topamax      Relevant Orders   Ambulatory referral to Neurology   TSH + free T4   CBC with Differential/Platelet   Prediabetes    Lab  Results  Component Value Date   HGBA1C 6.1 (H) 08/03/2021  Diet controlled      Relevant Orders   Hemoglobin A1c   Encounter for general adult medical examination with abnormal findings - Primary    Physical exam as documented. Fasting blood tests ordered. Flu vaccine today.      Other Visit Diagnoses     Need for immunization against influenza       Relevant Orders   Flu Vaccine QUAD High Dose(Fluad) (Completed)       No orders of the defined types were  placed in this encounter.   Follow-up: Return in about 6 months (around 07/21/2022) for HTN and CKD.    Lindell Spar, MD

## 2022-01-20 NOTE — Assessment & Plan Note (Signed)
Denies any recent seizure-like activity Referred to [facility] neurology On Keppra and Topamax

## 2022-01-20 NOTE — Assessment & Plan Note (Signed)
Physical exam as documented. Fasting blood tests ordered. Flu vaccine today.

## 2022-01-20 NOTE — Assessment & Plan Note (Signed)
BP Readings from Last 1 Encounters:  01/20/22 122/78   Well-controlled with Metoprolol and Cardizem Counseled for compliance with the medications Advised DASH diet and moderate exercise/walking as tolerated

## 2022-01-20 NOTE — Assessment & Plan Note (Signed)
Lab Results  Component Value Date   HGBA1C 6.1 (H) 08/03/2021   Diet controlled

## 2022-01-20 NOTE — Assessment & Plan Note (Signed)
Last CMP reviewed, CKD stage 3b, recently decline in GFR to 30s F/u with Nephrology Avoid nephrotoxic agents No proteinuria currently according to Nephrology chart

## 2022-01-20 NOTE — Assessment & Plan Note (Addendum)
H/o multiple TIAs On Plavix and statin F/u with Neurology

## 2022-01-20 NOTE — Assessment & Plan Note (Signed)
On Crestor now

## 2022-01-20 NOTE — Patient Instructions (Signed)
Please continue taking medications as prescribed.  Please get fasting blood tests done within a month.  Please consider getting Shingrix and Tdap vaccine at local pharmacy.

## 2022-02-07 ENCOUNTER — Encounter: Payer: Self-pay | Admitting: Internal Medicine

## 2022-02-07 DIAGNOSIS — E1122 Type 2 diabetes mellitus with diabetic chronic kidney disease: Secondary | ICD-10-CM | POA: Diagnosis not present

## 2022-02-07 DIAGNOSIS — E782 Mixed hyperlipidemia: Secondary | ICD-10-CM | POA: Diagnosis not present

## 2022-02-07 DIAGNOSIS — I5032 Chronic diastolic (congestive) heart failure: Secondary | ICD-10-CM | POA: Diagnosis not present

## 2022-02-07 DIAGNOSIS — N189 Chronic kidney disease, unspecified: Secondary | ICD-10-CM | POA: Diagnosis not present

## 2022-02-07 DIAGNOSIS — E870 Hyperosmolality and hypernatremia: Secondary | ICD-10-CM | POA: Diagnosis not present

## 2022-02-07 DIAGNOSIS — R7303 Prediabetes: Secondary | ICD-10-CM | POA: Diagnosis not present

## 2022-02-07 DIAGNOSIS — I129 Hypertensive chronic kidney disease with stage 1 through stage 4 chronic kidney disease, or unspecified chronic kidney disease: Secondary | ICD-10-CM | POA: Diagnosis not present

## 2022-02-08 ENCOUNTER — Other Ambulatory Visit: Payer: Self-pay | Admitting: Internal Medicine

## 2022-02-08 DIAGNOSIS — E039 Hypothyroidism, unspecified: Secondary | ICD-10-CM

## 2022-02-08 LAB — LIPID PANEL
Chol/HDL Ratio: 2.5 ratio (ref 0.0–4.4)
Cholesterol, Total: 157 mg/dL (ref 100–199)
HDL: 64 mg/dL (ref >39)
LDL Chol Calc (NIH): 77 mg/dL (ref 0–99)
Triglycerides: 85 mg/dL (ref 0–149)
VLDL Cholesterol Cal: 16 mg/dL (ref 5–40)

## 2022-02-08 LAB — CBC WITH DIFFERENTIAL/PLATELET
Basophils Absolute: 0.1 10*3/uL (ref 0.0–0.2)
Basos: 1 %
EOS (ABSOLUTE): 0.2 10*3/uL (ref 0.0–0.4)
Eos: 4 %
Hematocrit: 42.1 % (ref 34.0–46.6)
Hemoglobin: 14 g/dL (ref 11.1–15.9)
Immature Grans (Abs): 0 10*3/uL (ref 0.0–0.1)
Immature Granulocytes: 0 %
Lymphocytes Absolute: 1.6 10*3/uL (ref 0.7–3.1)
Lymphs: 35 %
MCH: 31.9 pg (ref 26.6–33.0)
MCHC: 33.3 g/dL (ref 31.5–35.7)
MCV: 96 fL (ref 79–97)
Monocytes Absolute: 0.3 10*3/uL (ref 0.1–0.9)
Monocytes: 7 %
Neutrophils Absolute: 2.4 10*3/uL (ref 1.4–7.0)
Neutrophils: 53 %
Platelets: 279 10*3/uL (ref 150–450)
RBC: 4.39 x10E6/uL (ref 3.77–5.28)
RDW: 14.1 % (ref 11.7–15.4)
WBC: 4.6 10*3/uL (ref 3.4–10.8)

## 2022-02-08 LAB — HEMOGLOBIN A1C
Est. average glucose Bld gHb Est-mCnc: 126 mg/dL
Hgb A1c MFr Bld: 6 % — ABNORMAL HIGH (ref 4.8–5.6)

## 2022-02-08 LAB — TSH+FREE T4
Free T4: 1.27 ng/dL (ref 0.82–1.77)
TSH: 0.287 u[IU]/mL — ABNORMAL LOW (ref 0.450–4.500)

## 2022-02-08 MED ORDER — LEVOTHYROXINE SODIUM 25 MCG PO TABS: 25.0000 ug | ORAL_TABLET | Freq: Every day | ORAL | 1 refills | Status: DC

## 2022-02-11 ENCOUNTER — Telehealth: Payer: Self-pay | Admitting: Internal Medicine

## 2022-02-11 NOTE — Telephone Encounter (Signed)
Return call    Call back at [phone]

## 2022-02-11 NOTE — Telephone Encounter (Signed)
Patients daughter aware of lab results and changes in medication.

## 2022-02-12 ENCOUNTER — Emergency Department (HOSPITAL_COMMUNITY): Payer: Medicare Other

## 2022-02-12 ENCOUNTER — Other Ambulatory Visit: Payer: Self-pay

## 2022-02-12 ENCOUNTER — Inpatient Hospital Stay (HOSPITAL_COMMUNITY)
Admission: EM | Admit: 2022-02-12 | Discharge: 2022-02-15 | DRG: 193 | Disposition: A | Discharge status: Home Health | Payer: Medicare Other | Attending: Internal Medicine | Admitting: Internal Medicine

## 2022-02-12 ENCOUNTER — Encounter (HOSPITAL_COMMUNITY): Payer: Self-pay

## 2022-02-12 DIAGNOSIS — G40209 Localization-related (focal) (partial) symptomatic epilepsy and epileptic syndromes with complex partial seizures, not intractable, without status epilepticus: Secondary | ICD-10-CM | POA: Diagnosis present

## 2022-02-12 DIAGNOSIS — Z8261 Family history of arthritis: Secondary | ICD-10-CM | POA: Diagnosis not present

## 2022-02-12 DIAGNOSIS — Z7902 Long term (current) use of antithrombotics/antiplatelets: Secondary | ICD-10-CM | POA: Diagnosis not present

## 2022-02-12 DIAGNOSIS — G9340 Encephalopathy, unspecified: Secondary | ICD-10-CM | POA: Diagnosis present

## 2022-02-12 DIAGNOSIS — E039 Hypothyroidism, unspecified: Secondary | ICD-10-CM | POA: Diagnosis present

## 2022-02-12 DIAGNOSIS — I10 Essential (primary) hypertension: Secondary | ICD-10-CM | POA: Diagnosis not present

## 2022-02-12 DIAGNOSIS — E876 Hypokalemia: Secondary | ICD-10-CM | POA: Diagnosis present

## 2022-02-12 DIAGNOSIS — R410 Disorientation, unspecified: Secondary | ICD-10-CM | POA: Diagnosis present

## 2022-02-12 DIAGNOSIS — G9341 Metabolic encephalopathy: Secondary | ICD-10-CM | POA: Diagnosis present

## 2022-02-12 DIAGNOSIS — R531 Weakness: Secondary | ICD-10-CM

## 2022-02-12 DIAGNOSIS — Z1152 Encounter for screening for COVID-19: Secondary | ICD-10-CM

## 2022-02-12 DIAGNOSIS — Z85038 Personal history of other malignant neoplasm of large intestine: Secondary | ICD-10-CM | POA: Diagnosis not present

## 2022-02-12 DIAGNOSIS — J181 Lobar pneumonia, unspecified organism: Principal | ICD-10-CM | POA: Diagnosis present

## 2022-02-12 DIAGNOSIS — G8194 Hemiplegia, unspecified affecting left nondominant side: Secondary | ICD-10-CM | POA: Diagnosis present

## 2022-02-12 DIAGNOSIS — Z83438 Family history of other disorder of lipoprotein metabolism and other lipidemia: Secondary | ICD-10-CM

## 2022-02-12 DIAGNOSIS — Z90710 Acquired absence of both cervix and uterus: Secondary | ICD-10-CM

## 2022-02-12 DIAGNOSIS — Z833 Family history of diabetes mellitus: Secondary | ICD-10-CM

## 2022-02-12 DIAGNOSIS — Z86010 Personal history of colonic polyps: Secondary | ICD-10-CM | POA: Diagnosis not present

## 2022-02-12 DIAGNOSIS — J90 Pleural effusion, not elsewhere classified: Secondary | ICD-10-CM | POA: Diagnosis not present

## 2022-02-12 DIAGNOSIS — I129 Hypertensive chronic kidney disease with stage 1 through stage 4 chronic kidney disease, or unspecified chronic kidney disease: Secondary | ICD-10-CM | POA: Diagnosis present

## 2022-02-12 DIAGNOSIS — E782 Mixed hyperlipidemia: Secondary | ICD-10-CM | POA: Diagnosis present

## 2022-02-12 DIAGNOSIS — Z66 Do not resuscitate: Secondary | ICD-10-CM | POA: Diagnosis present

## 2022-02-12 DIAGNOSIS — Z8673 Personal history of transient ischemic attack (TIA), and cerebral infarction without residual deficits: Secondary | ICD-10-CM | POA: Diagnosis not present

## 2022-02-12 DIAGNOSIS — Z86711 Personal history of pulmonary embolism: Secondary | ICD-10-CM | POA: Diagnosis not present

## 2022-02-12 DIAGNOSIS — R918 Other nonspecific abnormal finding of lung field: Secondary | ICD-10-CM | POA: Diagnosis not present

## 2022-02-12 DIAGNOSIS — J189 Pneumonia, unspecified organism: Secondary | ICD-10-CM | POA: Diagnosis not present

## 2022-02-12 DIAGNOSIS — R29818 Other symptoms and signs involving the nervous system: Secondary | ICD-10-CM | POA: Diagnosis not present

## 2022-02-12 DIAGNOSIS — J320 Chronic maxillary sinusitis: Secondary | ICD-10-CM | POA: Diagnosis not present

## 2022-02-12 DIAGNOSIS — N1832 Chronic kidney disease, stage 3b: Secondary | ICD-10-CM | POA: Diagnosis present

## 2022-02-12 DIAGNOSIS — Z8249 Family history of ischemic heart disease and other diseases of the circulatory system: Secondary | ICD-10-CM

## 2022-02-12 DIAGNOSIS — Z87898 Personal history of other specified conditions: Secondary | ICD-10-CM

## 2022-02-12 DIAGNOSIS — Z79899 Other long term (current) drug therapy: Secondary | ICD-10-CM

## 2022-02-12 DIAGNOSIS — Z79890 Hormone replacement therapy (postmenopausal): Secondary | ICD-10-CM

## 2022-02-12 DIAGNOSIS — R7302 Impaired glucose tolerance (oral): Secondary | ICD-10-CM | POA: Diagnosis present

## 2022-02-12 DIAGNOSIS — N184 Chronic kidney disease, stage 4 (severe): Secondary | ICD-10-CM | POA: Diagnosis present

## 2022-02-12 DIAGNOSIS — I6782 Cerebral ischemia: Secondary | ICD-10-CM | POA: Diagnosis not present

## 2022-02-12 DIAGNOSIS — Z800 Family history of malignant neoplasm of digestive organs: Secondary | ICD-10-CM

## 2022-02-12 DIAGNOSIS — J9811 Atelectasis: Secondary | ICD-10-CM | POA: Diagnosis not present

## 2022-02-12 DIAGNOSIS — J329 Chronic sinusitis, unspecified: Secondary | ICD-10-CM | POA: Diagnosis not present

## 2022-02-12 DIAGNOSIS — G9389 Other specified disorders of brain: Secondary | ICD-10-CM | POA: Diagnosis not present

## 2022-02-12 DIAGNOSIS — R109 Unspecified abdominal pain: Secondary | ICD-10-CM | POA: Diagnosis not present

## 2022-02-12 LAB — COMPREHENSIVE METABOLIC PANEL
ALT: 14 U/L (ref 0–44)
AST: 17 U/L (ref 15–41)
Albumin: 4 g/dL (ref 3.5–5.0)
Alkaline Phosphatase: 83 U/L (ref 38–126)
Anion gap: 8 (ref 5–15)
BUN: 14 mg/dL (ref 8–23)
CO2: 25 mmol/L (ref 22–32)
Calcium: 9.3 mg/dL (ref 8.9–10.3)
Chloride: 111 mmol/L (ref 98–111)
Creatinine, Ser: 1.48 mg/dL — ABNORMAL HIGH (ref 0.44–1.00)
GFR, Estimated: 34 mL/min — ABNORMAL LOW (ref >60)
Glucose, Bld: 114 mg/dL — ABNORMAL HIGH (ref 70–99)
Potassium: 3.7 mmol/L (ref 3.5–5.1)
Sodium: 144 mmol/L (ref 135–145)
Total Bilirubin: 0.5 mg/dL (ref 0.3–1.2)
Total Protein: 8.2 g/dL — ABNORMAL HIGH (ref 6.5–8.1)

## 2022-02-12 LAB — URINALYSIS, ROUTINE W REFLEX MICROSCOPIC
Bilirubin Urine: NEGATIVE
Glucose, UA: NEGATIVE mg/dL
Ketones, ur: NEGATIVE mg/dL
Nitrite: NEGATIVE
Protein, ur: NEGATIVE mg/dL
Specific Gravity, Urine: 1.008 (ref 1.005–1.030)
pH: 8 (ref 5.0–8.0)

## 2022-02-12 LAB — CBC WITH DIFFERENTIAL/PLATELET
Abs Immature Granulocytes: 0.02 10*3/uL (ref 0.00–0.07)
Basophils Absolute: 0 10*3/uL (ref 0.0–0.1)
Basophils Relative: 0 %
Eosinophils Absolute: 0.4 10*3/uL (ref 0.0–0.5)
Eosinophils Relative: 5 %
HCT: 43.3 % (ref 36.0–46.0)
Hemoglobin: 13.9 g/dL (ref 12.0–15.0)
Immature Granulocytes: 0 %
Lymphocytes Relative: 9 %
Lymphs Abs: 0.7 10*3/uL (ref 0.7–4.0)
MCH: 31.4 pg (ref 26.0–34.0)
MCHC: 32.1 g/dL (ref 30.0–36.0)
MCV: 98 fL (ref 80.0–100.0)
Monocytes Absolute: 0.7 10*3/uL (ref 0.1–1.0)
Monocytes Relative: 8 %
Neutro Abs: 6.3 10*3/uL (ref 1.7–7.7)
Neutrophils Relative %: 78 %
Platelets: 259 10*3/uL (ref 150–400)
RBC: 4.42 MIL/uL (ref 3.87–5.11)
RDW: 14.6 % (ref 11.5–15.5)
WBC: 8.1 10*3/uL (ref 4.0–10.5)
nRBC: 0 % (ref 0.0–0.2)

## 2022-02-12 LAB — SARS CORONAVIRUS 2 BY RT PCR: SARS Coronavirus 2 by RT PCR: NEGATIVE

## 2022-02-12 LAB — RESP PANEL BY RT-PCR (FLU A&B, COVID) ARPGX2
Influenza A by PCR: NEGATIVE
Influenza B by PCR: NEGATIVE
SARS Coronavirus 2 by RT PCR: NEGATIVE

## 2022-02-12 MED ORDER — ROSUVASTATIN CALCIUM 20 MG PO TABS
40.0000 mg | ORAL_TABLET | Freq: Every day | ORAL | Status: DC
  Administered 2022-02-12 – 2022-02-15 (×4): 40 mg via ORAL
  Filled 2022-02-12 (×4): qty 2

## 2022-02-12 MED ORDER — ONDANSETRON HCL 4 MG/2ML IJ SOLN: 4.0000 mg | Freq: Four times a day (QID) | INTRAMUSCULAR | Status: DC | PRN

## 2022-02-12 MED ORDER — GABAPENTIN 300 MG PO CAPS
300.0000 mg | ORAL_CAPSULE | Freq: Three times a day (TID) | ORAL | Status: DC
  Administered 2022-02-12 – 2022-02-15 (×8): 300 mg via ORAL
  Filled 2022-02-12 (×8): qty 1

## 2022-02-12 MED ORDER — DILTIAZEM HCL 30 MG PO TABS
30.0000 mg | ORAL_TABLET | Freq: Two times a day (BID) | ORAL | Status: DC
  Administered 2022-02-12 – 2022-02-15 (×6): 30 mg via ORAL
  Filled 2022-02-12 (×6): qty 1

## 2022-02-12 MED ORDER — TOPIRAMATE 25 MG PO TABS
50.0000 mg | ORAL_TABLET | Freq: Two times a day (BID) | ORAL | Status: DC
  Administered 2022-02-12 – 2022-02-15 (×6): 50 mg via ORAL
  Filled 2022-02-12 (×6): qty 2

## 2022-02-12 MED ORDER — IOHEXOL 300 MG/ML  SOLN
60.0000 mL | Freq: Once | INTRAMUSCULAR | Status: AC | PRN
  Administered 2022-02-12: 60 mL via INTRAVENOUS

## 2022-02-12 MED ORDER — SODIUM CHLORIDE 0.9 % IV SOLN
500.0000 mg | Freq: Once | INTRAVENOUS | Status: AC
  Administered 2022-02-12: 500 mg via INTRAVENOUS
  Filled 2022-02-12: qty 5

## 2022-02-12 MED ORDER — MECLIZINE HCL 12.5 MG PO TABS: 25.0000 mg | ORAL_TABLET | Freq: Three times a day (TID) | ORAL | Status: DC | PRN

## 2022-02-12 MED ORDER — CEFTRIAXONE SODIUM 1 G IJ SOLR
2.0000 g | Freq: Once | INTRAMUSCULAR | Status: AC
  Administered 2022-02-12: 2 g via INTRAMUSCULAR
  Filled 2022-02-12: qty 20

## 2022-02-12 MED ORDER — LEVOTHYROXINE SODIUM 25 MCG PO TABS
25.0000 ug | ORAL_TABLET | Freq: Every day | ORAL | Status: DC
  Administered 2022-02-13 – 2022-02-15 (×3): 25 ug via ORAL
  Filled 2022-02-12 (×3): qty 1

## 2022-02-12 MED ORDER — TRAMADOL HCL 50 MG PO TABS: 25.0000 mg | ORAL_TABLET | Freq: Three times a day (TID) | ORAL | Status: DC | PRN

## 2022-02-12 MED ORDER — SODIUM CHLORIDE 0.9 % IV SOLN
2.0000 g | INTRAVENOUS | Status: DC
  Administered 2022-02-13 – 2022-02-14 (×2): 2 g via INTRAVENOUS
  Filled 2022-02-12 (×2): qty 20

## 2022-02-12 MED ORDER — LEVETIRACETAM 250 MG PO TABS
250.0000 mg | ORAL_TABLET | Freq: Two times a day (BID) | ORAL | Status: DC
  Administered 2022-02-12 – 2022-02-15 (×6): 250 mg via ORAL
  Filled 2022-02-12 (×6): qty 1

## 2022-02-12 MED ORDER — ONDANSETRON HCL 4 MG PO TABS: 4.0000 mg | ORAL_TABLET | Freq: Four times a day (QID) | ORAL | Status: DC | PRN

## 2022-02-12 MED ORDER — LINACLOTIDE 72 MCG PO CAPS
72.0000 ug | ORAL_CAPSULE | ORAL | Status: DC
  Administered 2022-02-12 – 2022-02-14 (×2): 72 ug via ORAL
  Filled 2022-02-12 (×3): qty 1

## 2022-02-12 MED ORDER — ENOXAPARIN SODIUM 40 MG/0.4ML IJ SOSY
40.0000 mg | PREFILLED_SYRINGE | INTRAMUSCULAR | Status: DC
  Administered 2022-02-12: 40 mg via SUBCUTANEOUS
  Filled 2022-02-12: qty 0.4

## 2022-02-12 MED ORDER — CLOPIDOGREL BISULFATE 75 MG PO TABS
75.0000 mg | ORAL_TABLET | Freq: Every day | ORAL | Status: DC
  Administered 2022-02-13 – 2022-02-15 (×3): 75 mg via ORAL
  Filled 2022-02-12 (×3): qty 1

## 2022-02-12 MED ORDER — SODIUM CHLORIDE 0.9 % IV SOLN: 1.0000 g | Freq: Once | INTRAVENOUS | Status: DC

## 2022-02-12 MED ORDER — METOPROLOL SUCCINATE ER 50 MG PO TB24
100.0000 mg | ORAL_TABLET | Freq: Two times a day (BID) | ORAL | Status: DC
  Administered 2022-02-12 – 2022-02-15 (×6): 100 mg via ORAL
  Filled 2022-02-12 (×6): qty 2

## 2022-02-12 MED ORDER — SODIUM CHLORIDE 0.9 % IV SOLN
500.0000 mg | INTRAVENOUS | Status: DC
  Administered 2022-02-13 – 2022-02-14 (×2): 500 mg via INTRAVENOUS
  Filled 2022-02-12 (×2): qty 5

## 2022-02-12 MED ORDER — SODIUM CHLORIDE 0.9 % IV SOLN: INTRAVENOUS | Status: DC

## 2022-02-12 NOTE — ED Notes (Signed)
Repositioned patient, blood pressure remained elevated 185/118, will notify EDP

## 2022-02-12 NOTE — ED Provider Notes (Signed)
[facility] Provider Note   CSN: [id number] Arrival date & time: 02/12/22  6073     History  Chief Complaint  Patient presents with   Weakness    [name] is a 87 y.o. female.  Patient complains of feeling weak.  Patient is here with her daughter who provides most of patient's history patient began experiencing upper respiratory symptoms 2 days ago.  Patient's daughter reports she has had cough and congestion.  Daughter reports when she went to check on the patient this morning she was sitting on the toilet and said that she fell asleep and could not get up.  Patient's daughter attempted to assist her but patient could not walk and appeared to have left-sided weakness.  Patient denies any facial weakness she has not had any hearing loss she has not had any visual change.   The history is provided by the patient. No language interpreter was used.  Weakness Severity:  Severe Onset quality:  Gradual Duration:  1 day Timing:  Constant Progression:  Worsening Chronicity:  New Relieved by:  Nothing Worsened by:  Nothing Ineffective treatments:  None tried Associated symptoms: abdominal pain   Associated symptoms: no headaches   Risk factors: no anemia        Home Medications Prior to Admission medications   Medication Sig Start Date End Date Taking? Authorizing Provider  acetaminophen (TYLENOL) 500 MG tablet Take 1,000 mg by mouth every 8 (eight) hours as needed for mild pain or headache.    [provider]  ascorbic acid (VITAMIN C) 250 MG CHEW Chew 250 mg by mouth daily.    [provider]  ASPERCREME LIDOCAINE EX Apply 1 application topically daily as needed (for knee pain).     [provider]  cholecalciferol (VITAMIN D3) 25 MCG (1000 UT) tablet Take 1,000 Units by mouth daily.     [provider]  clopidogrel (PLAVIX) 75 MG tablet TAKE ONE TABLET BY MOUTH ONCE DAILY. 11/29/21   Arnoldo Lenis, MD   diltiazem (CARDIZEM) 30 MG tablet Take 1 tablet (30 mg total) by mouth 2 (two) times daily. May take additional 30 mg tablet as needed for palpitations. 11/25/21   Arnoldo Lenis, MD  gabapentin (NEURONTIN) 300 MG capsule Take 300 mg by mouth 3 (three) times daily.    [provider]  levETIRAcetam (KEPPRA) 500 MG tablet Take 250 mg by mouth 2 (two) times daily.    [provider]  levothyroxine (SYNTHROID) 25 MCG tablet Take 1 tablet (25 mcg total) by mouth daily. 02/08/22   Lindell Spar, MD  Lifitegrast Shirley Friar) 5 % SOLN Apply to eye.    [provider]  LINZESS 72 MCG capsule TAKE (1) CAPSULE BY MOUTH DAILY AS NEEDED FOR CONSTIPATION. Patient taking differently: Take 72 mcg by mouth every other day. 10/26/20   Lindell Spar, MD  loratadine (CLARITIN) 10 MG tablet Take 10 mg by mouth daily.     [provider]  meclizine (ANTIVERT) 25 MG tablet Take 1 tablet (25 mg total) by mouth 3 (three) times daily as needed for dizziness. 11/03/20   Veryl Speak, MD  metoprolol succinate (TOPROL-XL) 100 MG 24 hr tablet Take 1 tablet (100 mg total) by mouth in the morning and at bedtime. Take with or immediately following a meal. 09/27/21 09/22/22  Branch, Alphonse Guild, MD  RESTASIS 0.05 % ophthalmic emulsion Place 1 drop into both eyes 2 (two) times daily as needed (chronic  dry eye). 05/17/10   [provider]  rosuvastatin (CRESTOR) 40 MG tablet Take 1 tablet (40 mg total) by mouth daily. 09/09/21 09/04/22  Arnoldo Lenis, MD  topiramate (TOPAMAX) 50 MG tablet Take 50 mg by mouth 2 (two) times daily.    [provider]  traMADol (ULTRAM) 50 MG tablet Take 25 mg by mouth every 8 (eight) hours as needed for moderate pain. 01/10/19   [provider]      Allergies    Tape    Review of Systems   Review of Systems  Gastrointestinal:  Positive for abdominal pain.  Neurological:  Positive for weakness. Negative for headaches.  All other  systems reviewed and are negative.   Physical Exam Updated Vital Signs BP (!) 171/84   Pulse 78   Temp 99.1 F (37.3 C) (Oral)   Resp 16   SpO2 93%  Physical Exam Vitals and nursing note reviewed.  Constitutional:      Appearance: She is well-developed.  HENT:     Head: Normocephalic.     Mouth/Throat:     Mouth: Mucous membranes are moist.  Eyes:     Pupils: Pupils are equal, round, and reactive to light.  Cardiovascular:     Rate and Rhythm: Normal rate and regular rhythm.     Pulses: Normal pulses.  Pulmonary:     Effort: Pulmonary effort is normal.  Abdominal:     General: There is no distension.  Musculoskeletal:        General: Normal range of motion.     Cervical back: Normal range of motion.  Skin:    General: Skin is warm.  Neurological:     Mental Status: She is alert and oriented to person, place, and time.  Psychiatric:        Mood and Affect: Mood normal.     ED Results / Procedures / Treatments   Labs (all labs ordered are listed, but only abnormal results are displayed) Labs Reviewed  COMPREHENSIVE METABOLIC PANEL - Abnormal; Notable for the following components:      Result Value   Glucose, Bld 114 (*)    Creatinine, Ser 1.48 (*)    Total Protein 8.2 (*)    GFR, Estimated 34 (*)    All other components within normal limits  URINALYSIS, ROUTINE W REFLEX MICROSCOPIC - Abnormal; Notable for the following components:   Hgb urine dipstick MODERATE (*)    Leukocytes,Ua MODERATE (*)    Bacteria, UA RARE (*)    All other components within normal limits  SARS CORONAVIRUS 2 BY RT PCR  RESP PANEL BY RT-PCR (FLU A&B, COVID) [id number]  CBC WITH DIFFERENTIAL/PLATELET    EKG None  Radiology CT Chest W Contrast  Result Date: 02/12/2022 CLINICAL DATA:  Abnormal chest x-ray. EXAM: CT CHEST WITH CONTRAST TECHNIQUE: Multidetector CT imaging of the chest was performed during intravenous contrast administration. RADIATION DOSE REDUCTION: This exam was  performed according to the departmental dose-optimization program which includes automated exposure control, adjustment of the mA and/or kV according to patient size and/or use of iterative reconstruction technique. CONTRAST:  67m OMNIPAQUE IOHEXOL 300 MG/ML  SOLN COMPARISON:  Chest x-ray February 12, 2022. CT pulmonary angiogram June 01, 2019. FINDINGS: Cardiovascular: The thoracic aorta is nonaneurysmal without significant atherosclerotic change. Cardiomegaly. Central pulmonary arteries are unremarkable. Calcified atherosclerotic changes scattered in the left coronary arteries. Mediastinum/Nodes: Thyroid nodularity is unchanged since the April 26, 2018 CT scan of the chest. This nodule has been  previously biopsied and does not require follow-up. A right thyroid lobe is not visualized. The esophagus is normal. No left pleural or pericardial effusions. There is a small right pleural effusion. No adenopathy identified. The chest wall is normal. Lungs/Pleura: Evaluation of the lungs is limited due to respiratory motion. Central airways are normal. Bronchial wall thickening is identified in the bases, right greater than left. Mild opacity in the right lower lobe on series 5, image 69. Mild dependent opacities. Mild opacity in the right upper lobe is identified on coronal image 71 and axial image 47. No infiltrates otherwise identified. The possible spiculated nodule in the left upper lobe on the recent chest x-rays not visualized. This was likely confluence of shadows. No suspicious nodules or masses. Upper Abdomen: Hepatic steatosis. On right renal cysts. No acute abnormalities in the upper abdomen. Musculoskeletal: No chest wall abnormality. No acute or significant osseous findings. IMPRESSION: 1. Evaluation of the lungs is limited due to respiratory motion. Mild infiltrate is identified in the right upper and lower lobes worrisome for pneumonia. Recommend short-term follow-up imaging after treatment to ensure  resolution. 2. Small right pleural effusion. 3. No suspicious nodules or masses. The possible nodule in the left upper lobe on today's chest x-ray was likely confluence of shadows. 4. Cardiomegaly. 5. Coronary artery disease. 6. Hepatic steatosis. Aortic Atherosclerosis (ICD10-I70.0). Electronically Signed   By: Dorise Bullion III M.D.   On: 02/12/2022 17:16   DG Chest Port 1 View  Result Date: 02/12/2022 CLINICAL DATA:  Cough EXAM: PORTABLE CHEST 1 VIEW COMPARISON:  January 06, 2021 FINDINGS: Possible spiculated nodule in the left apex. Stable cardiomegaly. The hila and mediastinum are unchanged. Mild atelectasis in the left base. No other acute abnormalities. IMPRESSION: 1. Possible small spiculated nodule in the left apex. Recommend a CT scan of the chest for better evaluation. 2. No other acute abnormalities. Electronically Signed   By: Dorise Bullion III M.D.   On: 02/12/2022 14:59   CT Head Wo Contrast  Result Date: 02/12/2022 CLINICAL DATA:  Neuro deficit, acute, stroke suspected EXAM: CT HEAD WITHOUT CONTRAST TECHNIQUE: Contiguous axial images were obtained from the base of the skull through the vertex without intravenous contrast. RADIATION DOSE REDUCTION: This exam was performed according to the departmental dose-optimization program which includes automated exposure control, adjustment of the mA and/or kV according to patient size and/or use of iterative reconstruction technique. COMPARISON:  08/02/2021 FINDINGS: Brain: No evidence of acute infarction, hemorrhage, hydrocephalus, extra-axial collection or mass lesion/mass effect. Extensive low-density changes within the periventricular and subcortical white matter compatible with chronic microvascular ischemic change. Moderate diffuse cerebral volume loss. Vascular: Atherosclerotic calcifications involving the large vessels of the skull base. No unexpected hyperdense vessel. Skull: Normal. Negative for fracture or focal lesion. Sinuses/Orbits:  Mild mucosal thickening of the ethmoid air cells and right maxillary sinus. Small air-fluid level in the right maxillary sinus. Other: None. IMPRESSION: 1. No acute intracranial abnormality. 2. Chronic microvascular ischemic change and cerebral volume loss. 3. Paranasal sinus disease with small air-fluid level in the right maxillary sinus. Correlate for acute sinusitis. Electronically Signed   By: Davina Poke D.O.   On: 02/12/2022 10:19    Procedures Procedures    Medications Ordered in ED Medications - No data to display  ED Course/ Medical Decision Making/ A&P                           Medical Decision Making Pt lives  alone.  Pt has had a cough for several days.  Patient daughter reports that she found patient sitting in the bathroom today patient could not stand without assistance.  Patient's daughter reports that patient could not walk without assistance patient was leaning to the left patient's daughter believes patient has some left-sided weakness  Amount and/or Complexity of Data Reviewed Independent Historian: caregiver    Details: Patient is here with her daughter who is supportive External Data Reviewed: notes.    Details: Practice and cardiology notes are reviewed Labs: ordered. Decision-making details documented in ED Course.    Details: Abs ordered reviewed and interpreted CBC is normal UA shows moderate leukocytes rare bacteria glucose is 114 creatinine is 1.48 COVID and influenza are negative Radiology: ordered and independent interpretation performed. Decision-making details documented in ED Course.    Details: X-ray showed possible small chelated nodule and recommended CT CT head shows no evidence of CVA  CT of patient's chest shows right upper and lower lobe infiltrates ECG/medicine tests: ordered and independent interpretation performed. Decision-making details documented in ED Course.    Details: EKG no acute abnormality Discussion of management or test  interpretation with external provider(s): Hospitalist consulted for admission.  Risk Prescription drug management. Risk Details: Dr. Kathrynn Humble saw and evaluated patient he advised admission due to new weakness.  Patient is given Rocephin 1 g and Zithromax IV.  Mild infiltrate in the right upper and lower lobe MDM:        Final Clinical Impression(s) / ED Diagnoses Final diagnoses:  Pneumonia of right lung due to infectious organism, unspecified part of lung  Weakness    Rx / DC Orders ED Discharge Orders     None         Sidney Ace 02/12/22 Kittson, Ankit, MD 02/14/22 1049

## 2022-02-12 NOTE — Progress Notes (Signed)
Brought patient  up to the floor via stretcher. Patient unable to move from stretcher to bed by herself. She is unable to answer admission questions. When I gave her a cup to swallow her pills she had trouble bringing the cup up to her mouth. I had to give her medication one at a time and she still had trouble swallowing. She is incontinent. I called her daughter and she stated that this patient lives alone and takes care of all her ADL's. She is currently not at her baseline.

## 2022-02-12 NOTE — H&P (Signed)
History and Physical    Patient: [name] NWG:[id number] DOB: 12/09/1934 DOA: 02/12/2022 DOS: the patient was seen and examined on 02/12/2022 PCP: Lindell Spar, MD  Patient coming from: Home  Chief Complaint:  Chief Complaint  Patient presents with   Weakness   HPI: [name] is a 87 y.o. female with medical history significant of hypertension, hypothyroidism, hyperlipidemia, stage III chronic kidney disease, history of colon cancer, TIA and stroke with some left-sided weakness.  Patient presents with generalized weakness that started earlier this morning.  History is provided by the patient's daughter as the patient has delirium.  Due to respiratory symptoms that started 2 days ago, the patient's daughter has been staying the night last night.  This morning, she went to check on the patient and found her in the bathroom slumped against the side of the wall she had a very difficult time getting up and had a difficult time ambulating due to weakness.  There is been no slurred speech, facial droop, etc. however the patient had a difficult time ambulating due to his left-sided weakness.  Patient is having cough, weakness, abdominal pain.  No particular fevers or chills.  Review of Systems: As mentioned in the history of present illness. All other systems reviewed and are negative. Past Medical History:  Diagnosis Date   Allergy    Arthritis    Colon cancer ([place])    colon ca dx 07/30/09   History of cardiac monitoring 07/2017   "Event monitor demonstrated sinus rhythm with isolated PACs and no arrhythmias"   History of colon cancer 06/2009   found at time of TCS 06/29/09, 1.2cm sessile cecal polyp, no adjuvent therapy needed   HTN (hypertension)    Hx of cardiovascular stress test 07/2017   "No diagnostic ST segment changes to indicate ischemia. Small, moderate intensity, reversible apical to basal inferolateral defect consistent with ischemia. This is a low risk  study. Nuclear stress EF: 84%."   Hyperlipidemia    Hypothyroidism    PE (pulmonary thromboembolism) ([place])    Renal disorder    cyst on kidney    Silent micro-hemorrhage of brain ([place]) 11/25/2018   Stroke [facility])    TIA (transient ischemic attack) 11/25/2018   Vertigo    Past Surgical History:  Procedure Laterality Date   ABDOMINAL HYSTERECTOMY     COLON SURGERY  07/2009   right hemicolectomy, no residual colon cancer on path   COLONOSCOPY  07/16/2010   VHQ:[id number] POLYP-TCS 3 YEARS   COLONOSCOPY N/A 08/02/2013   hyperplastic polyps, surveillance in 2020 if benefits outweight the risks   COLONOSCOPY  06/2009   1.2 cm sessile cecal polyp which had adenocarcinoma arising in a tubular adenoma.   PARTIAL THYMECTOMY     partial thyroidectomy     benign tumors   Social History:  reports that she has never smoked. She has never used smokeless tobacco. She reports that she does not drink alcohol and does not use drugs.  Allergies  Allergen Reactions   Tape Rash    Zio monitor adhesive causes ulcerated and infected skin .Had to see derm    Family History  Problem Relation Age of Onset   Colon cancer Mother        >age84   Arthritis Mother    Cancer Mother    Heart disease Mother    Hyperlipidemia Mother    Hypertension Mother    Heart attack Father    Heart disease Father  Diabetes Maternal Aunt    Hyperlipidemia Daughter    Hypertension Daughter    Liver disease Neg Hx     Prior to Admission medications   Medication Sig Start Date End Date Taking? Authorizing Provider  acetaminophen (TYLENOL) 500 MG tablet Take 500 mg by mouth every 8 (eight) hours as needed for mild pain or headache.   Yes [provider]  ascorbic acid (VITAMIN C) 250 MG CHEW Chew 250 mg by mouth daily.   Yes [provider]  ASPERCREME LIDOCAINE EX Apply 1 application topically daily as needed (for knee pain).    Yes [provider]  cholecalciferol (VITAMIN D3) 25 MCG  (1000 UT) tablet Take 1,000 Units by mouth daily.    Yes [provider]  clopidogrel (PLAVIX) 75 MG tablet TAKE ONE TABLET BY MOUTH ONCE DAILY. 11/29/21  Yes Branch, Alphonse Guild, MD  diltiazem (CARDIZEM) 30 MG tablet Take 1 tablet (30 mg total) by mouth 2 (two) times daily. May take additional 30 mg tablet as needed for palpitations. 11/25/21  Yes Branch, Alphonse Guild, MD  gabapentin (NEURONTIN) 300 MG capsule Take 300 mg by mouth 3 (three) times daily.   Yes [provider]  levETIRAcetam (KEPPRA) 500 MG tablet Take 250 mg by mouth 2 (two) times daily.   Yes [provider]  Lifitegrast Shirley Friar) 5 % SOLN Apply 1 drop to eye daily as needed (dry eyes).   Yes [provider]  LINZESS 72 MCG capsule TAKE (1) CAPSULE BY MOUTH DAILY AS NEEDED FOR CONSTIPATION. Patient taking differently: Take 72 mcg by mouth every other day. 10/26/20  Yes Lindell Spar, MD  loratadine (CLARITIN) 10 MG tablet Take 10 mg by mouth daily.    Yes [provider]  meclizine (ANTIVERT) 25 MG tablet Take 1 tablet (25 mg total) by mouth 3 (three) times daily as needed for dizziness. 11/03/20  Yes Delo, Nathaneil Canary, MD  metoprolol succinate (TOPROL-XL) 100 MG 24 hr tablet Take 1 tablet (100 mg total) by mouth in the morning and at bedtime. Take with or immediately following a meal. 09/27/21 09/22/22 Yes Branch, Alphonse Guild, MD  rosuvastatin (CRESTOR) 40 MG tablet Take 1 tablet (40 mg total) by mouth daily. 09/09/21 09/04/22 Yes BranchAlphonse Guild, MD  topiramate (TOPAMAX) 50 MG tablet Take 50 mg by mouth 2 (two) times daily.   Yes [provider]  traMADol (ULTRAM) 50 MG tablet Take 25 mg by mouth every 8 (eight) hours as needed for moderate pain. 01/10/19  Yes [provider]  levothyroxine (SYNTHROID) 25 MCG tablet Take 1 tablet (25 mcg total) by mouth daily. 02/08/22   Lindell Spar, MD    Physical Exam: Vitals:   02/12/22 1351 02/12/22 1404 02/12/22 1530 02/12/22 1541   BP: (!) 185/118 (!) 183/87 (!) 180/80   Pulse: 90  92   Resp: (!) 27  (!) 31   Temp:    98.8 F (37.1 C)  TempSrc:    Oral  SpO2: 97%  99%    General: elderly female. Awake and alert and oriented x1. No acute cardiopulmonary distress.  HEENT: Normocephalic atraumatic.  Right and left ears normal in appearance.  Pupils equal, round, reactive to light. Extraocular muscles are intact. Sclerae anicteric and noninjected.  Moist mucosal membranes. No mucosal lesions.  Neck: Neck supple without lymphadenopathy. No carotid bruits. No masses palpated.  Cardiovascular: Regular rate with normal S1-S2 sounds. No murmurs, rubs, gallops auscultated. No JVD.  Respiratory: rales bilaterally. Diminished  breath sounds.  No accessory muscle use. Abdomen: Soft, nontender, nondistended. Active bowel sounds. No masses or hepatosplenomegaly  Skin: No rashes, lesions, or ulcerations.  Dry, warm to touch. 2+ dorsalis pedis and radial pulses. Musculoskeletal: No calf or leg pain. All major joints not erythematous nontender.  No upper or lower joint deformation.  Good ROM.  No contractures  Psychiatric: impaired judgement Neurologic: No focal neurological deficits. Strength is 5/5 and symmetric in upper and lower extremities.  Cranial nerves II through XII are grossly intact.  Data Reviewed: Results for orders placed or performed during the hospital encounter of 02/12/22 (from the past 24 hour(s))  CBC with Differential     Status: None   Collection Time: 02/12/22  9:10 AM  Result Value Ref Range   WBC 8.1 4.0 - 10.5 K/uL   RBC 4.42 3.87 - 5.11 MIL/uL   Hemoglobin 13.9 12.0 - 15.0 g/dL   HCT 43.3 36.0 - 46.0 %   MCV 98.0 80.0 - 100.0 fL   MCH 31.4 26.0 - 34.0 pg   MCHC 32.1 30.0 - 36.0 g/dL   RDW 14.6 11.5 - 15.5 %   Platelets 259 150 - 400 K/uL   nRBC 0.0 0.0 - 0.2 %   Neutrophils Relative % 78 %   Neutro Abs 6.3 1.7 - 7.7 K/uL   Lymphocytes Relative 9 %   Lymphs Abs 0.7 0.7 - 4.0 K/uL   Monocytes  Relative 8 %   Monocytes Absolute 0.7 0.1 - 1.0 K/uL   Eosinophils Relative 5 %   Eosinophils Absolute 0.4 0.0 - 0.5 K/uL   Basophils Relative 0 %   Basophils Absolute 0.0 0.0 - 0.1 K/uL   Immature Granulocytes 0 %   Abs Immature Granulocytes 0.02 0.00 - 0.07 K/uL  Comprehensive metabolic panel     Status: Abnormal   Collection Time: 02/12/22  9:10 AM  Result Value Ref Range   Sodium 144 135 - 145 mmol/L   Potassium 3.7 3.5 - 5.1 mmol/L   Chloride 111 98 - 111 mmol/L   CO2 25 22 - 32 mmol/L   Glucose, Bld 114 (H) 70 - 99 mg/dL   BUN 14 8 - 23 mg/dL   Creatinine, Ser 1.48 (H) 0.44 - 1.00 mg/dL   Calcium 9.3 8.9 - 10.3 mg/dL   Total Protein 8.2 (H) 6.5 - 8.1 g/dL   Albumin 4.0 3.5 - 5.0 g/dL   AST 17 15 - 41 U/L   ALT 14 0 - 44 U/L   Alkaline Phosphatase 83 38 - 126 U/L   Total Bilirubin 0.5 0.3 - 1.2 mg/dL   GFR, Estimated 34 (L) >60 mL/min   Anion gap 8 5 - 15  SARS Coronavirus 2 by RT PCR (hospital order, performed in [place] hospital lab) *cepheid single result test* Anterior Nasal Swab     Status: None   Collection Time: 02/12/22  9:46 AM   Specimen: Anterior Nasal Swab  Result Value Ref Range   SARS Coronavirus 2 by RT PCR NEGATIVE NEGATIVE  Urinalysis, Routine w reflex microscopic Urine, Clean Catch     Status: Abnormal   Collection Time: 02/12/22  9:57 AM  Result Value Ref Range   Color, Urine YELLOW YELLOW   APPearance CLEAR CLEAR   Specific Gravity, Urine 1.008 1.005 - 1.030   pH 8.0 5.0 - 8.0   Glucose, UA NEGATIVE NEGATIVE mg/dL   Hgb urine dipstick MODERATE (A) NEGATIVE   Bilirubin Urine NEGATIVE NEGATIVE   Ketones,  ur NEGATIVE NEGATIVE mg/dL   Protein, ur NEGATIVE NEGATIVE mg/dL   Nitrite NEGATIVE NEGATIVE   Leukocytes,Ua MODERATE (A) NEGATIVE   RBC / HPF 0-5 0 - 5 RBC/hpf   WBC, UA 0-5 0 - 5 WBC/hpf   Bacteria, UA RARE (A) NONE SEEN  Resp Panel by RT-PCR (Flu A&B, Covid) Anterior Nasal Swab     Status: None   Collection Time: 02/12/22  1:35 PM    Specimen: Anterior Nasal Swab  Result Value Ref Range   SARS Coronavirus 2 by RT PCR NEGATIVE NEGATIVE   Influenza A by PCR NEGATIVE NEGATIVE   Influenza B by PCR NEGATIVE NEGATIVE   CT Chest W Contrast  Result Date: 02/12/2022 CLINICAL DATA:  Abnormal chest x-ray. EXAM: CT CHEST WITH CONTRAST TECHNIQUE: Multidetector CT imaging of the chest was performed during intravenous contrast administration. RADIATION DOSE REDUCTION: This exam was performed according to the departmental dose-optimization program which includes automated exposure control, adjustment of the mA and/or kV according to patient size and/or use of iterative reconstruction technique. CONTRAST:  28m OMNIPAQUE IOHEXOL 300 MG/ML  SOLN COMPARISON:  Chest x-ray February 12, 2022. CT pulmonary angiogram June 01, 2019. FINDINGS: Cardiovascular: The thoracic aorta is nonaneurysmal without significant atherosclerotic change. Cardiomegaly. Central pulmonary arteries are unremarkable. Calcified atherosclerotic changes scattered in the left coronary arteries. Mediastinum/Nodes: Thyroid nodularity is unchanged since the April 26, 2018 CT scan of the chest. This nodule has been previously biopsied and does not require follow-up. A right thyroid lobe is not visualized. The esophagus is normal. No left pleural or pericardial effusions. There is a small right pleural effusion. No adenopathy identified. The chest wall is normal. Lungs/Pleura: Evaluation of the lungs is limited due to respiratory motion. Central airways are normal. Bronchial wall thickening is identified in the bases, right greater than left. Mild opacity in the right lower lobe on series 5, image 69. Mild dependent opacities. Mild opacity in the right upper lobe is identified on coronal image 71 and axial image 47. No infiltrates otherwise identified. The possible spiculated nodule in the left upper lobe on the recent chest x-rays not visualized. This was likely confluence of shadows. No  suspicious nodules or masses. Upper Abdomen: Hepatic steatosis. On right renal cysts. No acute abnormalities in the upper abdomen. Musculoskeletal: No chest wall abnormality. No acute or significant osseous findings. IMPRESSION: 1. Evaluation of the lungs is limited due to respiratory motion. Mild infiltrate is identified in the right upper and lower lobes worrisome for pneumonia. Recommend short-term follow-up imaging after treatment to ensure resolution. 2. Small right pleural effusion. 3. No suspicious nodules or masses. The possible nodule in the left upper lobe on today's chest x-ray was likely confluence of shadows. 4. Cardiomegaly. 5. Coronary artery disease. 6. Hepatic steatosis. Aortic Atherosclerosis (ICD10-I70.0). Electronically Signed   By: DDorise BullionIII M.D.   On: 02/12/2022 17:16   DG Chest Port 1 View  Result Date: 02/12/2022 CLINICAL DATA:  Cough EXAM: PORTABLE CHEST 1 VIEW COMPARISON:  January 06, 2021 FINDINGS: Possible spiculated nodule in the left apex. Stable cardiomegaly. The hila and mediastinum are unchanged. Mild atelectasis in the left base. No other acute abnormalities. IMPRESSION: 1. Possible small spiculated nodule in the left apex. Recommend a CT scan of the chest for better evaluation. 2. No other acute abnormalities. Electronically Signed   By: DDorise BullionIII M.D.   On: 02/12/2022 14:59   CT Head Wo Contrast  Result Date: 02/12/2022 CLINICAL DATA:  Neuro deficit, acute,  stroke suspected EXAM: CT HEAD WITHOUT CONTRAST TECHNIQUE: Contiguous axial images were obtained from the base of the skull through the vertex without intravenous contrast. RADIATION DOSE REDUCTION: This exam was performed according to the departmental dose-optimization program which includes automated exposure control, adjustment of the mA and/or kV according to patient size and/or use of iterative reconstruction technique. COMPARISON:  08/02/2021 FINDINGS: Brain: No evidence of acute infarction,  hemorrhage, hydrocephalus, extra-axial collection or mass lesion/mass effect. Extensive low-density changes within the periventricular and subcortical white matter compatible with chronic microvascular ischemic change. Moderate diffuse cerebral volume loss. Vascular: Atherosclerotic calcifications involving the large vessels of the skull base. No unexpected hyperdense vessel. Skull: Normal. Negative for fracture or focal lesion. Sinuses/Orbits: Mild mucosal thickening of the ethmoid air cells and right maxillary sinus. Small air-fluid level in the right maxillary sinus. Other: None. IMPRESSION: 1. No acute intracranial abnormality. 2. Chronic microvascular ischemic change and cerebral volume loss. 3. Paranasal sinus disease with small air-fluid level in the right maxillary sinus. Correlate for acute sinusitis. Electronically Signed   By: Davina Poke D.O.   On: 02/12/2022 10:19      Assessment and Plan: No notes have been filed under this hospital service. Service: Hospitalist  Principal Problem:   CAP (community acquired pneumonia) Active Problems:   Chronic kidney disease, stage 3b ([place])   History of colon cancer   Essential hypertension   Hypothyroidism   Left hemiparesis ([place])   Acute encephalopathy  Acute encephalopathy Secondary to infectious process We will watch for clearing as the patient starts antibiotics Commune acquired pneumonia Antibiotics: rocephin and azithromycin Robitussin Blood cultures drawn in the emergency department Sputum cultures CBC tomorrow Strep and Legionella antigen by urine Influenza screen neg Hypothyroidism synthroid Stage III chronic kidney disease stable Hypertension Continue antihypertensives. History of colon cancer   Advance Care Planning:   Code Status: Prior DNR  Consults: none  Family Communication: daughter present  Severity of Illness: The appropriate patient status for this patient is INPATIENT. Inpatient status is judged to  be reasonable and necessary in order to provide the required intensity of service to ensure the patient's safety. The patient's presenting symptoms, physical exam findings, and initial radiographic and laboratory data in the context of their chronic comorbidities is felt to place them at high risk for further clinical deterioration. Furthermore, it is not anticipated that the patient will be medically stable for discharge from the hospital within 2 midnights of admission.   * I certify that at the point of admission it is my clinical judgment that the patient will require inpatient hospital care spanning beyond 2 midnights from the point of admission due to high intensity of service, high risk for further deterioration and high frequency of surveillance required.*  Author: Truett Mainland, DO 02/12/2022 6:10 PM  For on call review [email or web address].

## 2022-02-12 NOTE — ED Notes (Signed)
Oxygen saturation on room air 86%, placed patient on oxygen @ 2liters

## 2022-02-12 NOTE — ED Triage Notes (Signed)
Patient present to ED via [facility], daughter reports mother sitting on toilet unable to get patient up. Noted patient leaning to left side and generalized weakness.

## 2022-02-12 NOTE — Progress Notes (Signed)
RT assessed pt per MD order. BBS clear, no distress noted.  Sats 93% on RA.  No RT intervention needed at this time.

## 2022-02-12 NOTE — ED Notes (Signed)
Patient oxygen remains 95% on 2 liters. Blood pressure elevated 188/102 will notify EDP

## 2022-02-13 DIAGNOSIS — N1832 Chronic kidney disease, stage 3b: Secondary | ICD-10-CM | POA: Diagnosis not present

## 2022-02-13 DIAGNOSIS — G40209 Localization-related (focal) (partial) symptomatic epilepsy and epileptic syndromes with complex partial seizures, not intractable, without status epilepticus: Secondary | ICD-10-CM

## 2022-02-13 DIAGNOSIS — G9340 Encephalopathy, unspecified: Secondary | ICD-10-CM | POA: Diagnosis not present

## 2022-02-13 DIAGNOSIS — J181 Lobar pneumonia, unspecified organism: Secondary | ICD-10-CM | POA: Diagnosis not present

## 2022-02-13 LAB — CBC
HCT: 42.8 % (ref 36.0–46.0)
Hemoglobin: 13.9 g/dL (ref 12.0–15.0)
MCH: 31.2 pg (ref 26.0–34.0)
MCHC: 32.5 g/dL (ref 30.0–36.0)
MCV: 96 fL (ref 80.0–100.0)
Platelets: 230 10*3/uL (ref 150–400)
RBC: 4.46 MIL/uL (ref 3.87–5.11)
RDW: 14.8 % (ref 11.5–15.5)
WBC: 8.4 10*3/uL (ref 4.0–10.5)
nRBC: 0 % (ref 0.0–0.2)

## 2022-02-13 LAB — BASIC METABOLIC PANEL
Anion gap: 11 (ref 5–15)
BUN: 18 mg/dL (ref 8–23)
CO2: 17 mmol/L — ABNORMAL LOW (ref 22–32)
Calcium: 8.8 mg/dL — ABNORMAL LOW (ref 8.9–10.3)
Chloride: 113 mmol/L — ABNORMAL HIGH (ref 98–111)
Creatinine, Ser: 1.42 mg/dL — ABNORMAL HIGH (ref 0.44–1.00)
GFR, Estimated: 36 mL/min — ABNORMAL LOW (ref >60)
Glucose, Bld: 127 mg/dL — ABNORMAL HIGH (ref 70–99)
Potassium: 2.5 mmol/L — CL (ref 3.5–5.1)
Sodium: 141 mmol/L (ref 135–145)

## 2022-02-13 MED ORDER — ENOXAPARIN SODIUM 30 MG/0.3ML IJ SOSY
30.0000 mg | PREFILLED_SYRINGE | INTRAMUSCULAR | Status: DC
  Administered 2022-02-13 – 2022-02-14 (×2): 30 mg via SUBCUTANEOUS
  Filled 2022-02-13 (×2): qty 0.3

## 2022-02-13 MED ORDER — POTASSIUM CHLORIDE 10 MEQ/100ML IV SOLN
10.0000 meq | INTRAVENOUS | Status: AC
  Administered 2022-02-13 (×2): 10 meq via INTRAVENOUS
  Filled 2022-02-13 (×2): qty 100

## 2022-02-13 MED ORDER — ACETAMINOPHEN 325 MG PO TABS
650.0000 mg | ORAL_TABLET | Freq: Four times a day (QID) | ORAL | Status: DC | PRN
  Administered 2022-02-13: 650 mg via ORAL
  Filled 2022-02-13: qty 2

## 2022-02-13 MED ORDER — POTASSIUM CHLORIDE IN NACL 40-0.9 MEQ/L-% IV SOLN: INTRAVENOUS | Status: DC

## 2022-02-13 MED ORDER — POTASSIUM CHLORIDE CRYS ER 20 MEQ PO TBCR
40.0000 meq | EXTENDED_RELEASE_TABLET | Freq: Once | ORAL | Status: AC
  Administered 2022-02-13: 40 meq via ORAL
  Filled 2022-02-13: qty 2

## 2022-02-13 NOTE — Evaluation (Signed)
Physical Therapy Evaluation Patient Details Name: [name] MRN: [id number] DOB: October 15, 1934 Today's Date: 02/13/2022  History of Present Illness  [name] is a 87 y.o. female with medical history significant of hypertension, hypothyroidism, hyperlipidemia, stage III chronic kidney disease, history of colon cancer, TIA and stroke with some left-sided weakness.  Patient presents with generalized weakness that started earlier this morning.  History is provided by the patient's daughter as the patient has delirium.  Due to respiratory symptoms that started 2 days ago, the patient's daughter has been staying the night last night.  This morning, she went to check on the patient and found her in the bathroom slumped against the side of the wall she had a very difficult time getting up and had a difficult time ambulating due to weakness.  There is been no slurred speech, facial droop, etc. however the patient had a difficult time ambulating due to his left-sided weakness.     Patient is having cough, weakness, abdominal pain.  No particular fevers or chills.   Clinical Impression  Patient demonstrates slow labored movement for sitting up at bedside requiring verbal/tactile cueing for proper hand placement when scooting to EOB, unsteady labored movement during transfers to/from chair and commode in bathroom, slightly impulsive and limited for ambulating in room due to c/o fatigue.  Patient tolerated sitting up in chair after therapy - RN notified.  Patient will benefit from continued skilled physical therapy in hospital and recommended venue below to increase strength, balance, endurance for safe ADLs and gait.          Recommendations for follow up therapy are one component of a multi-disciplinary discharge planning process, led by the attending physician.  Recommendations may be updated based on patient status, additional functional criteria and insurance authorization.  Follow Up  Recommendations Skilled nursing-short term rehab (<3 hours/day) Can patient physically be transported by private vehicle: Yes    Assistance Recommended at Discharge Intermittent Supervision/Assistance  Patient can return home with the following  A little help with walking and/or transfers;A little help with bathing/dressing/bathroom;Help with stairs or ramp for entrance;Assistance with cooking/housework    Equipment Recommendations None recommended by PT  Recommendations for Other Services       Functional Status Assessment Patient has had a recent decline in their functional status and demonstrates the ability to make significant improvements in function in a reasonable and predictable amount of time.     Precautions / Restrictions Precautions Precautions: Fall Restrictions Weight Bearing Restrictions: No      Mobility  Bed Mobility Overal bed mobility: Needs Assistance Bed Mobility: Supine to Sit           General bed mobility comments: slow labored movement requiring verbal cues for proper hand placement    Transfers Overall transfer level: Needs assistance Equipment used: Rolling walker (2 wheels) Transfers: Sit to/from Stand, Bed to chair/wheelchair/BSC Sit to Stand: Min assist, Mod assist   Step pivot transfers: Min assist, Mod assist       General transfer comment: very slow labored movement when not using an AD, has to lean on armrest of chair for support, safer using RW    Ambulation/Gait Ambulation/Gait assistance: Min assist Gait Distance (Feet): 25 Feet Assistive device: Rolling walker (2 wheels) Gait Pattern/deviations: Decreased step length - right, Decreased step length - left, Decreased stride length Gait velocity: decreased     General Gait Details: slow labored unsteady cadence without loss of balance, limited mostly due to fatigue  Stairs  Wheelchair Mobility    Modified Rankin (Stroke Patients Only)       Balance  Overall balance assessment: Needs assistance Sitting-balance support: Feet supported, No upper extremity supported Sitting balance-Leahy Scale: Fair Sitting balance - Comments: fair/good seated at EOB   Standing balance support: During functional activity, No upper extremity supported Standing balance-Leahy Scale: Poor Standing balance comment: fair using RW                             Pertinent Vitals/Pain Pain Assessment Pain Assessment: No/denies pain    Home Living Family/patient expects to be discharged to:: Private residence Living Arrangements: Alone Available Help at Discharge: Family;Available PRN/intermittently Type of Home: House Home Access: Stairs to enter Entrance Stairs-Rails: Right;Left;Can reach both Entrance Stairs-Number of Steps: 1   Home Layout: One level Home Equipment: Conservation officer, nature (2 wheels);Other (comment);BSC/3in1;Grab bars - tub/shower;Shower seat;Wheelchair - manual Additional Comments: most information taken from prior admission, patient may be poor historian    Prior Function Prior Level of Function : Needs assist       Physical Assist : Mobility (physical);ADLs (physical) Mobility (physical): Bed mobility;Transfers;Gait;Stairs   Mobility Comments: Household ambulator using RW ADLs Comments: assisted by family     Hand Dominance   Dominant Hand: Right    Extremity/Trunk Assessment   Upper Extremity Assessment Upper Extremity Assessment: Generalized weakness    Lower Extremity Assessment Lower Extremity Assessment: Generalized weakness    Cervical / Trunk Assessment Cervical / Trunk Assessment: Normal  Communication   Communication: No difficulties  Cognition Arousal/Alertness: Awake/alert Behavior During Therapy: WFL for tasks assessed/performed Overall Cognitive Status: No family/caregiver present to determine baseline cognitive functioning                                 General Comments: requires  occasional repeated verbal/tactile cueing to complete functional task with fair carryover        General Comments      Exercises     Assessment/Plan    PT Assessment Patient needs continued PT services  PT Problem List Decreased strength;Decreased activity tolerance;Decreased balance;Decreased mobility       PT Treatment Interventions DME instruction;Gait training;Stair training;Functional mobility training;Therapeutic activities;Therapeutic exercise;Patient/family education;Balance training    PT Goals (Current goals can be found in the Care Plan section)  Acute Rehab PT Goals Patient Stated Goal: return home with family to assist PT Goal Formulation: With patient Time For Goal Achievement: 02/27/22 Potential to Achieve Goals: Good    Frequency Min 3X/week     Co-evaluation               AM-PAC PT "6 Clicks" Mobility  Outcome Measure Help needed turning from your back to your side while in a flat bed without using bedrails?: A Little Help needed moving from lying on your back to sitting on the side of a flat bed without using bedrails?: A Lot Help needed moving to and from a bed to a chair (including a wheelchair)?: A Little Help needed standing up from a chair using your arms (e.g., wheelchair or bedside chair)?: A Little Help needed to walk in hospital room?: A Little Help needed climbing 3-5 steps with a railing? : A Lot 6 Click Score: 16    End of Session   Activity Tolerance: Patient tolerated treatment well;Patient limited by fatigue Patient left: in chair;with call bell/phone within reach;with chair  alarm set Nurse Communication: Mobility status PT Visit Diagnosis: Unsteadiness on feet (R26.81);Other abnormalities of gait and mobility (R26.89);Muscle weakness (generalized) (M62.81)    Time: 5733-4483 PT Time Calculation (min) (ACUTE ONLY): 30 min   Charges:   PT Evaluation $PT Eval Moderate Complexity: 1 Mod PT Treatments $Therapeutic Activity:  23-37 mins        2:21 PM, 02/13/22 Lonell Grandchild, MPT Physical Therapist with [facility] 336 [phone] office [phone] mobile phone

## 2022-02-13 NOTE — Plan of Care (Signed)
  Problem: Acute Rehab PT Goals(only PT should resolve) Goal: Pt Will Go Supine/Side To Sit Outcome: Progressing Flowsheets (Taken 02/13/2022 1422) Pt will go Supine/Side to Sit:  with supervision  with min guard assist Goal: Patient Will Transfer Sit To/From Stand Outcome: Progressing Flowsheets (Taken 02/13/2022 1422) Patient will transfer sit to/from stand:  with supervision  with min guard assist Goal: Pt Will Transfer Bed To Chair/Chair To Bed Outcome: Progressing Flowsheets (Taken 02/13/2022 1422) Pt will Transfer Bed to Chair/Chair to Bed:  with supervision  min guard assist Goal: Pt Will Ambulate Outcome: Progressing Flowsheets (Taken 02/13/2022 1422) Pt will Ambulate:  50 feet  with min guard assist  with supervision  with rolling walker   2:23 PM, 02/13/22 Lonell Grandchild, MPT Physical Therapist with [facility] [phone] office [phone] mobile phone

## 2022-02-13 NOTE — Progress Notes (Signed)
This morning patient was responding verbally to me. She could not tell me where she was or why she was here. However, she could take her pills better. She still was not able to hold her cup. She has had two episodes was watery diarrhea this shift.

## 2022-02-13 NOTE — Hospital Course (Signed)
87 year old female with a history of PE not on anticoagulation secondary to [place], complex partial seizure, TIA, hypertension, hyperlipidemia, colon cancer, hypothyroidism, CKD stage III, impaired glucose tolerance presenting with somnolence, generalized weakness, with cough and congestion for the last 2 days prior to admission.  History is supplemented by the patient's daughter at the bedside.  Daughter states that she had noted some chest congestion and rhinorrhea and occasional cough 2 days prior to admission.  On 02/12/2022, the patient was on the commode and fell asleep.  Her daughter went to check up on her, and the patient had difficulty getting up because of generalized weakness.  Daughter also noticed that the patient was more somnolent and little confused on 02/11/2022.  The patient received her COVID booster on 02/07/2022.  The patient has had low-grade temperatures up to 100.0 F at home.  There is been no vomiting, hemoptysis, worsening shortness of breath, chest pain, abdominal pain, diarrhea, hematochezia, melena. In the ED, the patient was febrile up to 102 20 F with oxygen saturation 97% room air.  She was hemodynamically stable.  WBC 8.1, hemoglobin 13.9, platelets 259,000.  Sodium 144, potassium 3.7, bicarbonate 25, serum creatinine 1.48.  LFTs were unremarkable.  CT of the chest showed mild infiltrates in the right upper lobe, right lower lobe with small right pleural effusion.  The patient was started on ceftriaxone and azithromycin.

## 2022-02-13 NOTE — Progress Notes (Signed)
   02/13/22 0131  Assess: MEWS Score  Temp (!) 102.8 F (39.3 C)  BP (!) 142/77  MAP (mmHg) 84  Pulse Rate 83  Resp 17  SpO2 92 %  O2 Device Room Air  Assess: MEWS Score  MEWS Temp 2  MEWS Systolic 0  MEWS Pulse 0  MEWS RR 0  MEWS LOC 0  MEWS Score 2  MEWS Score Color Yellow  Assess: if the MEWS score is Yellow or Red  Were vital signs taken at a resting state? Yes  Focused Assessment No change from prior assessment  Does the patient meet 2 or more of the SIRS criteria? No  MEWS guidelines implemented *See Row Information* Yes  Take Vital Signs  Increase Vital Sign Frequency  Yellow: Q 2hr X 2 then Q 4hr X 2, if remains yellow, continue Q 4hrs  Notify: Charge Nurse/RN  Name of Charge Nurse/RN Notified Grace Isaac, RN  Date Charge Nurse/RN Notified 02/13/22  Time Charge Nurse/RN Notified 0142  Assess: SIRS CRITERIA  SIRS Temperature  1  SIRS Pulse 0  SIRS Respirations  0  SIRS WBC 1  SIRS Score Sum  2

## 2022-02-13 NOTE — Progress Notes (Signed)
PROGRESS NOTE  [name] LGX:[id number] DOB: 1934-12-11 DOA: 02/12/2022 PCP: Lindell Spar, MD  Brief History:  87 year old female with a history of PE not on anticoagulation secondary to [place], complex partial seizure, TIA, hypertension, hyperlipidemia, colon cancer, hypothyroidism, CKD stage III, impaired glucose tolerance presenting with somnolence, generalized weakness, with cough and congestion for the last 2 days prior to admission.  History is supplemented by the patient's daughter at the bedside.  Daughter states that she had noted some chest congestion and rhinorrhea and occasional cough 2 days prior to admission.  On 02/12/2022, the patient was on the commode and fell asleep.  Her daughter went to check up on her, and the patient had difficulty getting up because of generalized weakness.  Daughter also noticed that the patient was more somnolent and little confused on 02/11/2022.  The patient received her COVID booster on 02/07/2022.  The patient has had low-grade temperatures up to 100.0 F at home.  There is been no vomiting, hemoptysis, worsening shortness of breath, chest pain, abdominal pain, diarrhea, hematochezia, melena. In the ED, the patient was febrile up to 102 20 F with oxygen saturation 97% room air.  She was hemodynamically stable.  WBC 8.1, hemoglobin 13.9, platelets 259,000.  Sodium 144, potassium 3.7, bicarbonate 25, serum creatinine 1.48.  LFTs were unremarkable.  CT of the chest showed mild infiltrates in the right upper lobe, right lower lobe with small right pleural effusion.  The patient was started on ceftriaxone and azithromycin.   Assessment/Plan: Acute metabolic encephalopathy -Gradually improving, not yet back to baseline according to daughter at bedside -Secondary to infectious process -UA negative for pyuria -Further workup with no improvement -CT brain negative  Lobar pneumonia -02/12/2022 CT chest--infiltrates RUL, RLL -Continue  ceftriaxone and azithromycin  Generalized weakness -Secondary to deconditioning and infectious process -PT eval  CKD stage IIIb -Baseline creatinine 1.2-1.5 -Monitor serial BMPs  Left lower extremity weakness -No focal weakness on exam 02/13/2022 -Daughter requesting MRI brain  Essential hypertension -Continue Cardizem, metoprolol succinate  History of TIA -Continue Plavix and Crestor  Mixed hyperlipidemia -Continue statin  Hypothyroidism -Continue Synthroid  Complex partial seizure -Continue Keppra  Hypokalemia -replete -check mag -add KCl to IVF    Family Communication:  daughter updated 12/3  Consultants:  none  Code Status:  DNR  DVT Prophylaxis:   [employer] Lovenox   Procedures: As Listed in Progress Note Above  Antibiotics: Ceftriaxone 12/2>> Azithro 12/2>>        Subjective: Patient denies fevers, chills, headache, chest pain, dyspnea, nausea, vomiting, diarrhea, abdominal pain, dysuria, hematuria, hematochezia, and melena.   Objective: Vitals:   02/13/22 0117 02/13/22 0131 02/13/22 0332 02/13/22 0559  BP:  (!) 142/77 (!) 146/77 (!) 140/72  Pulse:  83 82 71  Resp:  17 (!) 22 20  Temp:  (!) 102.8 F (39.3 C) 100.2 F (37.9 C) 99.7 F (37.6 C)  TempSrc: Oral Oral Oral Oral  SpO2:  92% 97% 96%  Weight:        Intake/Output Summary (Last 24 hours) at 02/13/2022 8144 Last data filed at 02/13/2022 0600 Gross per 24 hour  Intake 601.9 ml  Output --  Net 601.9 ml   Weight change:  Exam:  General:  Pt is alert, follows commands appropriately, not in acute distress HEENT: No icterus, No thrush, No neck mass, [place]/AT Cardiovascular: RRR, S1/S2, no rubs, no gallops Respiratory: R-base rales. No wheeze Abdomen: Soft/+BS, non tender,  non distended, no guarding Extremities: No edema, No lymphangitis, No petechiae, No rashes, no synovitis Neuro:  CN II-XII intact, strength 4/5 in RUE, RLE, strength 4/5 LUE, LLE; sensation intact bilateral; no  dysmetria; babinski equivocal    Data Reviewed: I have personally reviewed following labs and imaging studies Basic Metabolic Panel: Recent Labs  Lab 02/12/22 0910 02/13/22 0451  NA 144 141  K 3.7 2.5*  CL 111 113*  CO2 25 17*  GLUCOSE 114* 127*  BUN 14 18  CREATININE 1.48* 1.42*  CALCIUM 9.3 8.8*   Liver Function Tests: Recent Labs  Lab 02/12/22 0910  AST 17  ALT 14  ALKPHOS 83  BILITOT 0.5  PROT 8.2*  ALBUMIN 4.0   No results for input(s): "LIPASE", "AMYLASE" in the last 168 hours. No results for input(s): "AMMONIA" in the last 168 hours. Coagulation Profile: No results for input(s): "INR", "PROTIME" in the last 168 hours. CBC: Recent Labs  Lab 02/07/22 1023 02/12/22 0910 02/13/22 0451  WBC 4.6 8.1 8.4  NEUTROABS 2.4 6.3  --   HGB 14.0 13.9 13.9  HCT 42.1 43.3 42.8  MCV 96 98.0 96.0  PLT 279 259 230   Cardiac Enzymes: No results for input(s): "CKTOTAL", "CKMB", "CKMBINDEX", "TROPONINI" in the last 168 hours. BNP: Invalid input(s): "POCBNP" CBG: No results for input(s): "GLUCAP" in the last 168 hours. HbA1C: No results for input(s): "HGBA1C" in the last 72 hours. Urine analysis:    Component Value Date/Time   COLORURINE YELLOW 02/12/2022 0957   APPEARANCEUR CLEAR 02/12/2022 0957   LABSPEC 1.008 02/12/2022 0957   PHURINE 8.0 02/12/2022 0957   GLUCOSEU NEGATIVE 02/12/2022 0957   HGBUR MODERATE (A) 02/12/2022 0957   BILIRUBINUR NEGATIVE 02/12/2022 0957   KETONESUR NEGATIVE 02/12/2022 0957   PROTEINUR NEGATIVE 02/12/2022 0957   UROBILINOGEN 0.2 03/12/2012 0022   NITRITE NEGATIVE 02/12/2022 0957   LEUKOCYTESUR MODERATE (A) 02/12/2022 0957   Sepsis Labs: '@LABRCNTIP'$ (procalcitonin:4,lacticidven:4) ) Recent Results (from the past 240 hour(s))  SARS Coronavirus 2 by RT PCR (hospital order, performed in [place] hospital lab) *cepheid single result test* Anterior Nasal Swab     Status: None   Collection Time: 02/12/22  9:46 AM   Specimen:  Anterior Nasal Swab  Result Value Ref Range Status   SARS Coronavirus 2 by RT PCR NEGATIVE NEGATIVE Final    Comment: (NOTE) SARS-CoV-2 target nucleic acids are NOT DETECTED.  The SARS-CoV-2 RNA is generally detectable in upper and lower respiratory specimens during the acute phase of infection. The lowest concentration of SARS-CoV-2 viral copies this assay can detect is 250 copies / mL. A negative result does not preclude SARS-CoV-2 infection and should not be used as the sole basis for treatment or other patient management decisions.  A negative result may occur with improper specimen collection / handling, submission of specimen other than nasopharyngeal swab, presence of viral mutation(s) within the areas targeted by this assay, and inadequate number of viral copies (<250 copies / mL). A negative result must be combined with clinical observations, patient history, and epidemiological information.  Fact Sheet for Patients:   https://www.patel.info/  Fact Sheet for Healthcare Providers: https://hall.com/  This test is not yet approved or  cleared by the [place] FDA and has been authorized for detection and/or diagnosis of SARS-CoV-2 by FDA under an Emergency Use Authorization (EUA).  This EUA will remain in effect (meaning this test can be used) for the duration of the COVID-19 declaration under Section 564(b)(1) of the Act, 21  U.S.C. section 360bbb-3(b)(1), unless the authorization is terminated or revoked sooner.  Performed at [facility], [address], [place], [place] [address]   Resp Panel by RT-PCR (Flu A&B, Covid) Anterior Nasal Swab     Status: None   Collection Time: 02/12/22  1:35 PM   Specimen: Anterior Nasal Swab  Result Value Ref Range Status   SARS Coronavirus 2 by RT PCR NEGATIVE NEGATIVE Final    Comment: (NOTE) SARS-CoV-2 target nucleic acids are NOT DETECTED.  The SARS-CoV-2 RNA is generally detectable in  upper respiratory specimens during the acute phase of infection. The lowest concentration of SARS-CoV-2 viral copies this assay can detect is 138 copies/mL. A negative result does not preclude SARS-Cov-2 infection and should not be used as the sole basis for treatment or other patient management decisions. A negative result may occur with  improper specimen collection/handling, submission of specimen other than nasopharyngeal swab, presence of viral mutation(s) within the areas targeted by this assay, and inadequate number of viral copies(<138 copies/mL). A negative result must be combined with clinical observations, patient history, and epidemiological information. The expected result is Negative.  Fact Sheet for Patients:  EntrepreneurPulse.com.au  Fact Sheet for Healthcare Providers:  IncredibleEmployment.be  This test is no t yet approved or cleared by the [place] FDA and  has been authorized for detection and/or diagnosis of SARS-CoV-2 by FDA under an Emergency Use Authorization (EUA). This EUA will remain  in effect (meaning this test can be used) for the duration of the COVID-19 declaration under Section 564(b)(1) of the Act, 21 U.S.C.section 360bbb-3(b)(1), unless the authorization is terminated  or revoked sooner.       Influenza A by PCR NEGATIVE NEGATIVE Final   Influenza B by PCR NEGATIVE NEGATIVE Final    Comment: (NOTE) The Xpert Xpress SARS-CoV-2/FLU/RSV plus assay is intended as an aid in the diagnosis of influenza from Nasopharyngeal swab specimens and should not be used as a sole basis for treatment. Nasal washings and aspirates are unacceptable for Xpert Xpress SARS-CoV-2/FLU/RSV testing.  Fact Sheet for Patients: EntrepreneurPulse.com.au  Fact Sheet for Healthcare Providers: IncredibleEmployment.be  This test is not yet approved or cleared by the [place] FDA and has been  authorized for detection and/or diagnosis of SARS-CoV-2 by FDA under an Emergency Use Authorization (EUA). This EUA will remain in effect (meaning this test can be used) for the duration of the COVID-19 declaration under Section 564(b)(1) of the Act, 21 U.S.C. section 360bbb-3(b)(1), unless the authorization is terminated or revoked.  Performed at [facility], [address], [place], [place] [address]   Culture, blood (routine x 2) Call MD if unable to obtain prior to antibiotics being given     Status: None (Preliminary result)   Collection Time: 02/12/22  7:54 PM   Specimen: Left Antecubital; Blood  Result Value Ref Range Status   Specimen Description   Final    LEFT ANTECUBITAL BOTTLES DRAWN AEROBIC AND ANAEROBIC   Special Requests Blood Culture adequate volume  Final   Culture   Final    NO GROWTH < 12 HOURS Performed at [facility], [address], [place], [place] [address]    Report Status PENDING  Incomplete     Scheduled Meds:  clopidogrel  75 mg Oral Daily   diltiazem  30 mg Oral BID   enoxaparin (LOVENOX) injection  30 mg Subcutaneous Q24H   gabapentin  300 mg Oral TID   levETIRAcetam  250 mg Oral BID   levothyroxine  25 mcg Oral Daily   linaclotide  72 mcg Oral QODAY   metoprolol succinate  100 mg Oral BID   potassium chloride  40 mEq Oral Once   rosuvastatin  40 mg Oral Daily   topiramate  50 mg Oral BID   Continuous Infusions:  0.9 % NaCl with KCl 40 mEq / L     azithromycin     cefTRIAXone (ROCEPHIN)  IV     potassium chloride      Procedures/Studies: CT Chest W Contrast  Result Date: 02/12/2022 CLINICAL DATA:  Abnormal chest x-ray. EXAM: CT CHEST WITH CONTRAST TECHNIQUE: Multidetector CT imaging of the chest was performed during intravenous contrast administration. RADIATION DOSE REDUCTION: This exam was performed according to the departmental dose-optimization program which includes automated exposure control, adjustment of the mA and/or kV according to  patient size and/or use of iterative reconstruction technique. CONTRAST:  87m OMNIPAQUE IOHEXOL 300 MG/ML  SOLN COMPARISON:  Chest x-ray February 12, 2022. CT pulmonary angiogram June 01, 2019. FINDINGS: Cardiovascular: The thoracic aorta is nonaneurysmal without significant atherosclerotic change. Cardiomegaly. Central pulmonary arteries are unremarkable. Calcified atherosclerotic changes scattered in the left coronary arteries. Mediastinum/Nodes: Thyroid nodularity is unchanged since the April 26, 2018 CT scan of the chest. This nodule has been previously biopsied and does not require follow-up. A right thyroid lobe is not visualized. The esophagus is normal. No left pleural or pericardial effusions. There is a small right pleural effusion. No adenopathy identified. The chest wall is normal. Lungs/Pleura: Evaluation of the lungs is limited due to respiratory motion. Central airways are normal. Bronchial wall thickening is identified in the bases, right greater than left. Mild opacity in the right lower lobe on series 5, image 69. Mild dependent opacities. Mild opacity in the right upper lobe is identified on coronal image 71 and axial image 47. No infiltrates otherwise identified. The possible spiculated nodule in the left upper lobe on the recent chest x-rays not visualized. This was likely confluence of shadows. No suspicious nodules or masses. Upper Abdomen: Hepatic steatosis. On right renal cysts. No acute abnormalities in the upper abdomen. Musculoskeletal: No chest wall abnormality. No acute or significant osseous findings. IMPRESSION: 1. Evaluation of the lungs is limited due to respiratory motion. Mild infiltrate is identified in the right upper and lower lobes worrisome for pneumonia. Recommend short-term follow-up imaging after treatment to ensure resolution. 2. Small right pleural effusion. 3. No suspicious nodules or masses. The possible nodule in the left upper lobe on today's chest x-ray was  likely confluence of shadows. 4. Cardiomegaly. 5. Coronary artery disease. 6. Hepatic steatosis. Aortic Atherosclerosis (ICD10-I70.0). Electronically Signed   By: DDorise BullionIII M.D.   On: 02/12/2022 17:16   DG Chest Port 1 View  Result Date: 02/12/2022 CLINICAL DATA:  Cough EXAM: PORTABLE CHEST 1 VIEW COMPARISON:  January 06, 2021 FINDINGS: Possible spiculated nodule in the left apex. Stable cardiomegaly. The hila and mediastinum are unchanged. Mild atelectasis in the left base. No other acute abnormalities. IMPRESSION: 1. Possible small spiculated nodule in the left apex. Recommend a CT scan of the chest for better evaluation. 2. No other acute abnormalities. Electronically Signed   By: DDorise BullionIII M.D.   On: 02/12/2022 14:59   CT Head Wo Contrast  Result Date: 02/12/2022 CLINICAL DATA:  Neuro deficit, acute, stroke suspected EXAM: CT HEAD WITHOUT CONTRAST TECHNIQUE: Contiguous axial images were obtained from the base of the skull through the vertex without intravenous contrast. RADIATION DOSE REDUCTION:  This exam was performed according to the departmental dose-optimization program which includes automated exposure control, adjustment of the mA and/or kV according to patient size and/or use of iterative reconstruction technique. COMPARISON:  08/02/2021 FINDINGS: Brain: No evidence of acute infarction, hemorrhage, hydrocephalus, extra-axial collection or mass lesion/mass effect. Extensive low-density changes within the periventricular and subcortical white matter compatible with chronic microvascular ischemic change. Moderate diffuse cerebral volume loss. Vascular: Atherosclerotic calcifications involving the large vessels of the skull base. No unexpected hyperdense vessel. Skull: Normal. Negative for fracture or focal lesion. Sinuses/Orbits: Mild mucosal thickening of the ethmoid air cells and right maxillary sinus. Small air-fluid level in the right maxillary sinus. Other: None. IMPRESSION:  1. No acute intracranial abnormality. 2. Chronic microvascular ischemic change and cerebral volume loss. 3. Paranasal sinus disease with small air-fluid level in the right maxillary sinus. Correlate for acute sinusitis. Electronically Signed   By: Davina Poke D.O.   On: 02/12/2022 10:19    Orson Eva, DO  [facility]  If 7PM-7AM, please contact night-coverage [email or web address] Password [email or web address] 02/13/2022, 9:06 AM   LOS: 1 day

## 2022-02-13 NOTE — Progress Notes (Signed)
  Transition of Care [facility]) Screening Note   Patient Details  Name: [name] Date of Birth: 1934/06/02   Transition of Care [place]) CM/SW Contact:    Iona Beard, [place] Phone Number: 02/13/2022, 11:49 AM    Transition of Care Department [facility]) has reviewed patient and no TOC needs have been identified at this time. We will continue to monitor patient advancement through interdisciplinary progression rounds. If new patient transition needs arise, please place a TOC consult.

## 2022-02-14 ENCOUNTER — Inpatient Hospital Stay (HOSPITAL_COMMUNITY): Payer: Medicare Other

## 2022-02-14 ENCOUNTER — Inpatient Hospital Stay: Payer: Medicare Other

## 2022-02-14 DIAGNOSIS — J181 Lobar pneumonia, unspecified organism: Secondary | ICD-10-CM | POA: Diagnosis not present

## 2022-02-14 DIAGNOSIS — G9340 Encephalopathy, unspecified: Secondary | ICD-10-CM | POA: Diagnosis not present

## 2022-02-14 DIAGNOSIS — N1832 Chronic kidney disease, stage 3b: Secondary | ICD-10-CM | POA: Diagnosis not present

## 2022-02-14 LAB — BASIC METABOLIC PANEL
Anion gap: 8 (ref 5–15)
BUN: 19 mg/dL (ref 8–23)
CO2: 22 mmol/L (ref 22–32)
Calcium: 8.5 mg/dL — ABNORMAL LOW (ref 8.9–10.3)
Chloride: 113 mmol/L — ABNORMAL HIGH (ref 98–111)
Creatinine, Ser: 1.41 mg/dL — ABNORMAL HIGH (ref 0.44–1.00)
GFR, Estimated: 37 mL/min — ABNORMAL LOW (ref >60)
Glucose, Bld: 119 mg/dL — ABNORMAL HIGH (ref 70–99)
Potassium: 3.6 mmol/L (ref 3.5–5.1)
Sodium: 143 mmol/L (ref 135–145)

## 2022-02-14 LAB — CBC
HCT: 41.8 % (ref 36.0–46.0)
Hemoglobin: 13.5 g/dL (ref 12.0–15.0)
MCH: 31.5 pg (ref 26.0–34.0)
MCHC: 32.3 g/dL (ref 30.0–36.0)
MCV: 97.7 fL (ref 80.0–100.0)
Platelets: 213 10*3/uL (ref 150–400)
RBC: 4.28 MIL/uL (ref 3.87–5.11)
RDW: 15 % (ref 11.5–15.5)
WBC: 10.3 10*3/uL (ref 4.0–10.5)
nRBC: 0 % (ref 0.0–0.2)

## 2022-02-14 LAB — MAGNESIUM: Magnesium: 2 mg/dL (ref 1.7–2.4)

## 2022-02-14 NOTE — TOC Initial Note (Signed)
Transition of Care [facility]) - Initial/Assessment Note    Patient Details  Name: [name] MRN: [id number] Date of Birth: 03-27-1934  Transition of Care [facility]) CM/SW Contact:    Ihor Gully, LCSW Phone Number: 02/14/2022, 3:26 PM  Clinical Narrative:                 Patient from home alone. Daughter stays with her at night. Admitted for CAP. Has cane, walker, bsc. PT recommends SNF. Patient deferred to daughter. [name] prefers HHPT. [facility] is accepting of patient's referral and insurance.   Expected Discharge Plan: [place] Barriers to Discharge: Continued Medical Work up   Patient Goals and CMS Choice Patient states their goals for this hospitalization and ongoing recovery are:: daughter chooses home with [facility] PT      Expected Discharge Plan and Services Expected Discharge Plan: [place] Acute Care Choice: [place] arrangements for the past 2 months: Single Family Home                           HH Arranged: PT HH Agency: [facility] Date [place]: 02/14/22 Time [place] Agency Contacted: [place] Representative spoke with at [place]: Marjory Lies  Prior Living Arrangements/Services Living arrangements for the past 2 months: [place] with:: Self, Adult Children Patient language and need for interpreter reviewed:: Yes Do you feel safe going back to the place where you live?: Yes      Need for Family Participation in Patient Care: Yes (Comment) Care giver support system in place?: Yes (comment) Current home services: DME Criminal Activity/Legal Involvement Pertinent to Current Situation/Hospitalization: No - Comment as needed  Activities of Daily Living Home Assistive Devices/Equipment: None ADL Screening (condition at time of admission) Patient's cognitive ability adequate to safely complete daily activities?: No Is the patient deaf or have difficulty hearing?:  No Does the patient have difficulty seeing, even when wearing glasses/contacts?: No Does the patient have difficulty concentrating, remembering, or making decisions?: Yes Patient able to express need for assistance with ADLs?: No Does the patient have difficulty dressing or bathing?: Yes Independently performs ADLs?: No Communication: Independent Dressing (OT): Dependent Is this a change from baseline?: Change from baseline, expected to last <3days Grooming: Dependent Is this a change from baseline?: Change from baseline, expected to last <3 days Feeding: Dependent Is this a change from baseline?: Change from baseline, expected to last <3 days Bathing: Dependent Is this a change from baseline?: Change from baseline, expected to last <3 days Toileting: Dependent Is this a change from baseline?: Change from baseline, expected to last <3 days In/Out Bed: Dependent Is this a change from baseline?: Change from baseline, expected to last <3 days Walks in Home: Dependent Is this a change from baseline?: Change from baseline, expected to last <3 days Does the patient have difficulty walking or climbing stairs?: Yes Weakness of Legs: Both Weakness of Arms/Hands: Both  Permission Sought/Granted Permission sought to share information with : Family Supports    Share Information with NAME: daughter, Stage manager           Emotional Assessment     Affect (typically observed): Appropriate Orientation: : Oriented to Situation, Oriented to  Time, Oriented to Place, Oriented to Self Alcohol / Substance Use: Not Applicable Psych Involvement: No (comment)  Admission diagnosis:  Weakness [R53.1] CAP (community acquired pneumonia) [J18.9] Pneumonia  of right lung due to infectious organism, unspecified part of lung [J18.9] Patient Active Problem List   Diagnosis Date Noted   Lobar pneumonia ([place]) 02/13/2022   CAP (community acquired pneumonia) 02/12/2022   Acute encephalopathy 02/12/2022    Sensory disturbance 08/03/2021   Impaired glucose tolerance 08/03/2021   Chronic left shoulder pain 07/23/2021   Hypokalemia 01/08/2021   TIA (transient ischemic attack) 01/06/2021   Encounter for general adult medical examination with abnormal findings 12/15/2020   Leg swelling 11/09/2020   Headache 08/12/2020   Prediabetes 03/13/2019   Complex partial seizure ([place]) 03/02/2019   Left hemiparesis ([place]) 03/01/2019   PVC (premature ventricular contraction) 11/16/2018   DDD (degenerative disc disease), cervical 08/16/2018   Mixed hyperlipidemia 08/16/2018   Insomnia 12/15/2015   Essential hypertension 11/02/2015   Hypothyroidism 11/02/2015   Chronic kidney disease, stage 3b ([place]) 11/02/2015   OA (osteoarthritis) of knee 11/02/2015   Colon cancer ([place]) 02/28/2011   History of colon cancer 06/29/2010   Constipation 06/10/2009   PCP:  Lindell Spar, MD Pharmacy:   [facility], [place] [address] Phone: [phone] Fax: [phone]     Social Determinants of Health (SDOH) Interventions    Readmission Risk Interventions     No data to display

## 2022-02-14 NOTE — Progress Notes (Signed)
PROGRESS NOTE  [name] QXI:[id number] DOB: [birth year] DOA: 02/12/2022 PCP: Lindell Spar, MD  Brief History:  87 year old female with a history of PE not on anticoagulation secondary to [place], complex partial seizure, TIA, hypertension, hyperlipidemia, colon cancer, hypothyroidism, CKD stage III, impaired glucose tolerance presenting with somnolence, generalized weakness, with cough and congestion for the last 2 days prior to admission.  History is supplemented by the patient's daughter at the bedside.  Daughter states that she had noted some chest congestion and rhinorrhea and occasional cough 2 days prior to admission.  On 02/12/2022, the patient was on the commode and fell asleep.  Her daughter went to check up on her, and the patient had difficulty getting up because of generalized weakness.  Daughter also noticed that the patient was more somnolent and little confused on 02/11/2022.  The patient received her COVID booster on 02/07/2022.  The patient has had low-grade temperatures up to 100.0 F at home.  There is been no vomiting, hemoptysis, worsening shortness of breath, chest pain, abdominal pain, diarrhea, hematochezia, melena. In the ED, the patient was febrile up to 102 20 F with oxygen saturation 97% room air.  She was hemodynamically stable.  WBC 8.1, hemoglobin 13.9, platelets 259,000.  Sodium 144, potassium 3.7, bicarbonate 25, serum creatinine 1.48.  LFTs were unremarkable.  CT of the chest showed mild infiltrates in the right upper lobe, right lower lobe with small right pleural effusion.  The patient was started on ceftriaxone and azithromycin.   Assessment/Plan: Acute metabolic encephalopathy -improved.  Back to baseline -Secondary to infectious process -UA negative for pyuria -Further workup with no improvement -CT brain negative   Lobar pneumonia -02/12/2022 CT chest--infiltrates RUL, RLL -Continue ceftriaxone and azithromycin   Generalized  weakness -Secondary to deconditioning and infectious process -PT eval>>SNF -pt and daughter prefer to go home with HHPT   CKD stage IIIb -Baseline creatinine 1.2-1.5 -Monitor serial BMPs   Left lower extremity weakness -No focal weakness on exam 12//2023 -Daughter requesting MRI brain--neg for acute findings   Essential hypertension -Continue Cardizem, metoprolol succinate   History of TIA -Continue Plavix and Crestor   Mixed hyperlipidemia -Continue statin   Hypothyroidism -Continue Synthroid   Complex partial seizure -Continue Keppra   Hypokalemia -repleted -check mag 2.0 -add KCl to IVF       Family Communication:  daughter updated 12/4   Consultants:  none   Code Status:  DNR   DVT Prophylaxis:   [place] Lovenox     Procedures: As Listed in Progress Note Above   Antibiotics: Ceftriaxone 12/2>> Azithro 12/2>>             Subjective: Patient denies fevers, chills, headache, chest pain, dyspnea, nausea, vomiting, diarrhea, abdominal pain, dysuria, hematuria, hematochezia, and melena.   Objective: Vitals:   02/13/22 2033 02/14/22 0500 02/14/22 0844 02/14/22 1332  BP: (!) 112/97 139/73 (!) 159/84 (!) 163/73  Pulse: 79 74 72 69  Resp: '20 18  17  '$ Temp: 98.6 F (37 C) 98.5 F (36.9 C)  98.4 F (36.9 C)  TempSrc: Oral Oral    SpO2: 97% 96%  98%  Weight:        Intake/Output Summary (Last 24 hours) at 02/14/2022 1707 Last data filed at 02/14/2022 0900 Gross per 24 hour  Intake 1238.75 ml  Output --  Net 1238.75 ml   Weight change:  Exam:  General:  Pt is alert, follows commands appropriately, not  in acute distress HEENT: No icterus, No thrush, No neck mass, [place]/AT Cardiovascular: RRR, S1/S2, no rubs, no gallops Respiratory: bibasilar rales. No wheeze Abdomen: Soft/+BS, non tender, non distended, no guarding Extremities: No edema, No lymphangitis, No petechiae, No rashes, no synovitis   Data Reviewed: I have personally reviewed  following labs and imaging studies Basic Metabolic Panel: Recent Labs  Lab 02/12/22 0910 02/13/22 0451 02/14/22 0701  NA 144 141 143  K 3.7 2.5* 3.6  CL 111 113* 113*  CO2 25 17* 22  GLUCOSE 114* 127* 119*  BUN '14 18 19  '$ CREATININE 1.48* 1.42* 1.41*  CALCIUM 9.3 8.8* 8.5*  MG  --   --  2.0   Liver Function Tests: Recent Labs  Lab 02/12/22 0910  AST 17  ALT 14  ALKPHOS 83  BILITOT 0.5  PROT 8.2*  ALBUMIN 4.0   No results for input(s): "LIPASE", "AMYLASE" in the last 168 hours. No results for input(s): "AMMONIA" in the last 168 hours. Coagulation Profile: No results for input(s): "INR", "PROTIME" in the last 168 hours. CBC: Recent Labs  Lab 02/12/22 0910 02/13/22 0451 02/14/22 0701  WBC 8.1 8.4 10.3  NEUTROABS 6.3  --   --   HGB 13.9 13.9 13.5  HCT 43.3 42.8 41.8  MCV 98.0 96.0 97.7  PLT 259 230 213   Cardiac Enzymes: No results for input(s): "CKTOTAL", "CKMB", "CKMBINDEX", "TROPONINI" in the last 168 hours. BNP: Invalid input(s): "POCBNP" CBG: No results for input(s): "GLUCAP" in the last 168 hours. HbA1C: No results for input(s): "HGBA1C" in the last 72 hours. Urine analysis:    Component Value Date/Time   COLORURINE YELLOW 02/12/2022 0957   APPEARANCEUR CLEAR 02/12/2022 0957   LABSPEC 1.008 02/12/2022 0957   PHURINE 8.0 02/12/2022 0957   GLUCOSEU NEGATIVE 02/12/2022 0957   HGBUR MODERATE (A) 02/12/2022 0957   BILIRUBINUR NEGATIVE 02/12/2022 0957   KETONESUR NEGATIVE 02/12/2022 0957   PROTEINUR NEGATIVE 02/12/2022 0957   UROBILINOGEN 0.2 03/12/2012 0022   NITRITE NEGATIVE 02/12/2022 0957   LEUKOCYTESUR MODERATE (A) 02/12/2022 0957   Sepsis Labs: '@LABRCNTIP'$ (procalcitonin:4,lacticidven:4) ) Recent Results (from the past 240 hour(s))  SARS Coronavirus 2 by RT PCR (hospital order, performed in [place] hospital lab) *cepheid single result test* Anterior Nasal Swab     Status: None   Collection Time: 02/12/22  9:46 AM   Specimen: Anterior Nasal  Swab  Result Value Ref Range Status   SARS Coronavirus 2 by RT PCR NEGATIVE NEGATIVE Final    Comment: (NOTE) SARS-CoV-2 target nucleic acids are NOT DETECTED.  The SARS-CoV-2 RNA is generally detectable in upper and lower respiratory specimens during the acute phase of infection. The lowest concentration of SARS-CoV-2 viral copies this assay can detect is 250 copies / mL. A negative result does not preclude SARS-CoV-2 infection and should not be used as the sole basis for treatment or other patient management decisions.  A negative result may occur with improper specimen collection / handling, submission of specimen other than nasopharyngeal swab, presence of viral mutation(s) within the areas targeted by this assay, and inadequate number of viral copies (<250 copies / mL). A negative result must be combined with clinical observations, patient history, and epidemiological information.  Fact Sheet for Patients:   https://www.patel.info/  Fact Sheet for Healthcare Providers: https://hall.com/  This test is not yet approved or  cleared by the [place] FDA and has been authorized for detection and/or diagnosis of SARS-CoV-2 by FDA under an Emergency Use Authorization (EUA).  This EUA will remain in effect (meaning this test can be used) for the duration of the COVID-19 declaration under Section 564(b)(1) of the Act, 21 U.S.C. section 360bbb-3(b)(1), unless the authorization is terminated or revoked sooner.  Performed at [facility], [address], [place], [place] [address]   Resp Panel by RT-PCR (Flu A&B, Covid) Anterior Nasal Swab     Status: None   Collection Time: 02/12/22  1:35 PM   Specimen: Anterior Nasal Swab  Result Value Ref Range Status   SARS Coronavirus 2 by RT PCR NEGATIVE NEGATIVE Final    Comment: (NOTE) SARS-CoV-2 target nucleic acids are NOT DETECTED.  The SARS-CoV-2 RNA is generally detectable in upper  respiratory specimens during the acute phase of infection. The lowest concentration of SARS-CoV-2 viral copies this assay can detect is 138 copies/mL. A negative result does not preclude SARS-Cov-2 infection and should not be used as the sole basis for treatment or other patient management decisions. A negative result may occur with  improper specimen collection/handling, submission of specimen other than nasopharyngeal swab, presence of viral mutation(s) within the areas targeted by this assay, and inadequate number of viral copies(<138 copies/mL). A negative result must be combined with clinical observations, patient history, and epidemiological information. The expected result is Negative.  Fact Sheet for Patients:  EntrepreneurPulse.com.au  Fact Sheet for Healthcare Providers:  IncredibleEmployment.be  This test is no t yet approved or cleared by the [place] FDA and  has been authorized for detection and/or diagnosis of SARS-CoV-2 by FDA under an Emergency Use Authorization (EUA). This EUA will remain  in effect (meaning this test can be used) for the duration of the COVID-19 declaration under Section 564(b)(1) of the Act, 21 U.S.C.section 360bbb-3(b)(1), unless the authorization is terminated  or revoked sooner.       Influenza A by PCR NEGATIVE NEGATIVE Final   Influenza B by PCR NEGATIVE NEGATIVE Final    Comment: (NOTE) The Xpert Xpress SARS-CoV-2/FLU/RSV plus assay is intended as an aid in the diagnosis of influenza from Nasopharyngeal swab specimens and should not be used as a sole basis for treatment. Nasal washings and aspirates are unacceptable for Xpert Xpress SARS-CoV-2/FLU/RSV testing.  Fact Sheet for Patients: EntrepreneurPulse.com.au  Fact Sheet for Healthcare Providers: IncredibleEmployment.be  This test is not yet approved or cleared by the [place] FDA and has been  authorized for detection and/or diagnosis of SARS-CoV-2 by FDA under an Emergency Use Authorization (EUA). This EUA will remain in effect (meaning this test can be used) for the duration of the COVID-19 declaration under Section 564(b)(1) of the Act, 21 U.S.C. section 360bbb-3(b)(1), unless the authorization is terminated or revoked.  Performed at [facility], [address], [place], [place] [address]   Culture, blood (routine x 2) Call MD if unable to obtain prior to antibiotics being given     Status: None (Preliminary result)   Collection Time: 02/12/22  7:54 PM   Specimen: Left Antecubital; Blood  Result Value Ref Range Status   Specimen Description   Final    LEFT ANTECUBITAL BOTTLES DRAWN AEROBIC AND ANAEROBIC   Special Requests Blood Culture adequate volume  Final   Culture   Final    NO GROWTH 2 DAYS Performed at [facility], [address] [place],  [address]    Report Status PENDING  Incomplete     Scheduled Meds:  clopidogrel  75 mg Oral Daily   diltiazem  30 mg Oral BID   enoxaparin (LOVENOX)  injection  30 mg Subcutaneous Q24H   gabapentin  300 mg Oral TID   levETIRAcetam  250 mg Oral BID   levothyroxine  25 mcg Oral Daily   linaclotide  72 mcg Oral QODAY   metoprolol succinate  100 mg Oral BID   rosuvastatin  40 mg Oral Daily   topiramate  50 mg Oral BID   Continuous Infusions:  0.9 % NaCl with KCl 40 mEq / L Stopped (02/14/22 1637)   azithromycin 250 mL/hr at 02/14/22 0232   cefTRIAXone (ROCEPHIN)  IV 2 g (02/14/22 1635)    Procedures/Studies: MR BRAIN WO CONTRAST  Result Date: 02/14/2022 CLINICAL DATA:  Neuro deficit, acute, stroke suspected. Left hemiparesis. EXAM: MRI HEAD WITHOUT CONTRAST TECHNIQUE: Multiplanar, multiecho pulse sequences of the brain and surrounding structures were obtained without intravenous contrast. COMPARISON:  Head CT 2 days ago.  MRI 08/03/2021 FINDINGS: Brain: Diffusion imaging does not show any acute or subacute  infarction. There are chronic small-vessel ischemic changes affecting the pons. Few old small vessel cerebellar infarctions. Cerebral hemispheres show volume loss with subjective frontal lobe predominance. Extensive chronic small-vessel ischemic changes affect the thalami, basal ganglia and white matter. No evidence of large vessel territory stroke. There are punctate foci of hemosiderin deposition with some of the old small vessel infarctions, particularly in the thalami. There is ex vacuo enlargement of the frontal horns of the lateral ventricles. No suspicion of obstructive hydrocephalus. Vascular: Major vessels at the base of the brain show flow. Skull and upper cervical spine: Negative Sinuses/Orbits: Mucosal inflammatory changes of the paranasal sinuses. Orbits negative. Other: None IMPRESSION: 1. No acute intracranial finding. Extensive chronic small-vessel ischemic changes affecting the pons, thalami, basal ganglia and hemispheric white matter. Foci of hemosiderin deposition in some of the old small vessel infarctions, particularly in the thalami. No change since the prior exam. 2. Frontal lobe predominant volume loss. Ex vacuo enlargement of the frontal horns of the lateral ventricles. 3. Mucosal inflammatory changes of the paranasal sinuses. Electronically Signed   By: Nelson Chimes M.D.   On: 02/14/2022 10:11   CT Chest W Contrast  Result Date: 02/12/2022 CLINICAL DATA:  Abnormal chest x-ray. EXAM: CT CHEST WITH CONTRAST TECHNIQUE: Multidetector CT imaging of the chest was performed during intravenous contrast administration. RADIATION DOSE REDUCTION: This exam was performed according to the departmental dose-optimization program which includes automated exposure control, adjustment of the mA and/or kV according to patient size and/or use of iterative reconstruction technique. CONTRAST:  55m OMNIPAQUE IOHEXOL 300 MG/ML  SOLN COMPARISON:  Chest x-ray February 12, 2022. CT pulmonary angiogram June 01, 2019. FINDINGS: Cardiovascular: The thoracic aorta is nonaneurysmal without significant atherosclerotic change. Cardiomegaly. Central pulmonary arteries are unremarkable. Calcified atherosclerotic changes scattered in the left coronary arteries. Mediastinum/Nodes: Thyroid nodularity is unchanged since the April 26, 2018 CT scan of the chest. This nodule has been previously biopsied and does not require follow-up. A right thyroid lobe is not visualized. The esophagus is normal. No left pleural or pericardial effusions. There is a small right pleural effusion. No adenopathy identified. The chest wall is normal. Lungs/Pleura: Evaluation of the lungs is limited due to respiratory motion. Central airways are normal. Bronchial wall thickening is identified in the bases, right greater than left. Mild opacity in the right lower lobe on series 5, image 69. Mild dependent opacities. Mild opacity in the right upper lobe is identified on coronal image 71 and axial image 47. No infiltrates otherwise identified. The possible spiculated nodule in  the left upper lobe on the recent chest x-rays not visualized. This was likely confluence of shadows. No suspicious nodules or masses. Upper Abdomen: Hepatic steatosis. On right renal cysts. No acute abnormalities in the upper abdomen. Musculoskeletal: No chest wall abnormality. No acute or significant osseous findings. IMPRESSION: 1. Evaluation of the lungs is limited due to respiratory motion. Mild infiltrate is identified in the right upper and lower lobes worrisome for pneumonia. Recommend short-term follow-up imaging after treatment to ensure resolution. 2. Small right pleural effusion. 3. No suspicious nodules or masses. The possible nodule in the left upper lobe on today's chest x-ray was likely confluence of shadows. 4. Cardiomegaly. 5. Coronary artery disease. 6. Hepatic steatosis. Aortic Atherosclerosis (ICD10-I70.0). Electronically Signed   By: Dorise Bullion III M.D.   On:  02/12/2022 17:16   DG Chest Port 1 View  Result Date: 02/12/2022 CLINICAL DATA:  Cough EXAM: PORTABLE CHEST 1 VIEW COMPARISON:  January 06, 2021 FINDINGS: Possible spiculated nodule in the left apex. Stable cardiomegaly. The hila and mediastinum are unchanged. Mild atelectasis in the left base. No other acute abnormalities. IMPRESSION: 1. Possible small spiculated nodule in the left apex. Recommend a CT scan of the chest for better evaluation. 2. No other acute abnormalities. Electronically Signed   By: Dorise Bullion III M.D.   On: 02/12/2022 14:59   CT Head Wo Contrast  Result Date: 02/12/2022 CLINICAL DATA:  Neuro deficit, acute, stroke suspected EXAM: CT HEAD WITHOUT CONTRAST TECHNIQUE: Contiguous axial images were obtained from the base of the skull through the vertex without intravenous contrast. RADIATION DOSE REDUCTION: This exam was performed according to the departmental dose-optimization program which includes automated exposure control, adjustment of the mA and/or kV according to patient size and/or use of iterative reconstruction technique. COMPARISON:  08/02/2021 FINDINGS: Brain: No evidence of acute infarction, hemorrhage, hydrocephalus, extra-axial collection or mass lesion/mass effect. Extensive low-density changes within the periventricular and subcortical white matter compatible with chronic microvascular ischemic change. Moderate diffuse cerebral volume loss. Vascular: Atherosclerotic calcifications involving the large vessels of the skull base. No unexpected hyperdense vessel. Skull: Normal. Negative for fracture or focal lesion. Sinuses/Orbits: Mild mucosal thickening of the ethmoid air cells and right maxillary sinus. Small air-fluid level in the right maxillary sinus. Other: None. IMPRESSION: 1. No acute intracranial abnormality. 2. Chronic microvascular ischemic change and cerebral volume loss. 3. Paranasal sinus disease with small air-fluid level in the right maxillary sinus.  Correlate for acute sinusitis. Electronically Signed   By: Davina Poke D.O.   On: 02/12/2022 10:19    Orson Eva, DO  [facility]  If 7PM-7AM, please contact night-coverage [email or web address] Password [email or web address] 02/14/2022, 5:07 PM   LOS: 2 days

## 2022-02-15 DIAGNOSIS — I10 Essential (primary) hypertension: Secondary | ICD-10-CM

## 2022-02-15 LAB — BASIC METABOLIC PANEL
Anion gap: 6 (ref 5–15)
BUN: 14 mg/dL (ref 8–23)
CO2: 20 mmol/L — ABNORMAL LOW (ref 22–32)
Calcium: 8.5 mg/dL — ABNORMAL LOW (ref 8.9–10.3)
Chloride: 119 mmol/L — ABNORMAL HIGH (ref 98–111)
Creatinine, Ser: 1.11 mg/dL — ABNORMAL HIGH (ref 0.44–1.00)
GFR, Estimated: 49 mL/min — ABNORMAL LOW (ref >60)
Glucose, Bld: 103 mg/dL — ABNORMAL HIGH (ref 70–99)
Potassium: 3.8 mmol/L (ref 3.5–5.1)
Sodium: 145 mmol/L (ref 135–145)

## 2022-02-15 LAB — MAGNESIUM: Magnesium: 2 mg/dL (ref 1.7–2.4)

## 2022-02-15 MED ORDER — AZITHROMYCIN 250 MG PO TABS: 500.0000 mg | ORAL_TABLET | Freq: Every day | ORAL | Status: DC

## 2022-02-15 MED ORDER — CEFDINIR 300 MG PO CAPS: 300.0000 mg | ORAL_CAPSULE | Freq: Two times a day (BID) | ORAL | 0 refills | Status: DC

## 2022-02-15 MED ORDER — CEFDINIR 300 MG PO CAPS
300.0000 mg | ORAL_CAPSULE | Freq: Two times a day (BID) | ORAL | Status: DC
  Administered 2022-02-15: 300 mg via ORAL
  Filled 2022-02-15: qty 1

## 2022-02-15 MED ORDER — AZITHROMYCIN 500 MG PO TABS: 500.0000 mg | ORAL_TABLET | Freq: Every day | ORAL | 0 refills | Status: DC

## 2022-02-15 NOTE — Progress Notes (Addendum)
Physical Therapy Treatment Patient Details Name: [name] MRN: [id number] DOB: 02-26-1935 Today's Date: 02/15/2022   History of Present Illness [name] is a 87 y.o. female who was admitted on 02/12/22 with generalized weakness. PMH significant for hypertension, hypothyroidism, hyperlipidemia, stage III chronic kidney disease, colon cancer, TIA and stroke with some left-sided weakness.    PT Comments    Patient demonstrates labored movement for sitting up at bedside requiring min/mod assist. Patient attempted to stand with SPC, unsteady on feet requiring mod assist for balance, switched to RW with increased stability. Patient demonstrates increased endurance/distance for ambulation with RW/min guard and no c/o fatigue. Patient tolerated sitting up in chair after therapy - nursing staff aware. Patient will benefit from continued skilled physical therapy in hospital and recommended venue below to increase strength, balance, endurance for safe ADLs and gait.    Recommendations for follow up therapy are one component of a multi-disciplinary discharge planning process, led by the attending physician.  Recommendations may be updated based on patient status, additional functional criteria and insurance authorization.  Follow Up Recommendations  Skilled nursing-short term rehab (<3 hours/day) Can patient physically be transported by private vehicle: Yes   Assistance Recommended at Discharge Intermittent Supervision/Assistance  Patient can return home with the following A little help with walking and/or transfers;A little help with bathing/dressing/bathroom;Help with stairs or ramp for entrance;Assistance with cooking/housework   Equipment Recommendations  None recommended by PT    Recommendations for Other Services       Precautions / Restrictions Precautions Precautions: Fall Restrictions Weight Bearing Restrictions: No     Mobility  Bed Mobility Overal bed  mobility: Needs Assistance Bed Mobility: Supine to Sit     Supine to sit: Min assist, Mod assist     General bed mobility comments: slow labored movement, requiring min/mod assist to elevate trunk and scoot to EOB    Transfers Overall transfer level: Needs assistance Equipment used: Rolling walker (2 wheels), Straight cane Transfers: Sit to/from Stand Sit to Stand: Min assist, Mod assist           General transfer comment: patient attempted to stand with SPC, unsteady on feet requiring mod A therapist support to stand for balance, switched to RW and pt able to stand with min assist and increased stability    Ambulation/Gait Ambulation/Gait assistance: Min guard, Supervision Gait Distance (Feet): 55 Feet Assistive device: Rolling walker (2 wheels) Gait Pattern/deviations: Decreased step length - right, Decreased step length - left, Decreased stride length Gait velocity: slow     General Gait Details: patient demonstrates increased endurance/distance for ambulation today with supervision/min guard and RW, slowed cadence, no c/o fatigue   Stairs             Wheelchair Mobility    Modified Rankin (Stroke Patients Only)       Balance Overall balance assessment: Needs assistance Sitting-balance support: Feet supported, No upper extremity supported Sitting balance-Leahy Scale: Good Sitting balance - Comments: seated EOB   Standing balance support: During functional activity, Reliant on assistive device for balance, Bilateral upper extremity supported, Single extremity supported Standing balance-Leahy Scale: Fair Standing balance comment: poor with SPC, fair with RW                            Cognition Arousal/Alertness: Awake/alert Behavior During Therapy: WFL for tasks assessed/performed Overall Cognitive Status: Within Functional Limits for tasks assessed  Exercises General Exercises -  Lower Extremity Long Arc Quad: Seated, Strengthening, AROM, Both, 10 reps Hip Flexion/Marching: Seated, AROM, Strengthening, Both, 10 reps Toe Raises: Seated, AROM, Strengthening, Both, 10 reps Heel Raises: Seated, AROM, Strengthening, Both, 10 reps    General Comments        Pertinent Vitals/Pain Pain Assessment Pain Assessment: No/denies pain    Home Living                          Prior Function            PT Goals (current goals can now be found in the care plan section) Acute Rehab PT Goals Patient Stated Goal: return home after rehab PT Goal Formulation: With patient Time For Goal Achievement: 02/22/22 Potential to Achieve Goals: Good Progress towards PT goals: Progressing toward goals    Frequency    Min 3X/week      PT Plan Current plan remains appropriate    Co-evaluation              AM-PAC PT "6 Clicks" Mobility   Outcome Measure  Help needed turning from your back to your side while in a flat bed without using bedrails?: A Little Help needed moving from lying on your back to sitting on the side of a flat bed without using bedrails?: A Lot Help needed moving to and from a bed to a chair (including a wheelchair)?: A Little Help needed standing up from a chair using your arms (e.g., wheelchair or bedside chair)?: A Little Help needed to walk in hospital room?: A Little Help needed climbing 3-5 steps with a railing? : A Lot 6 Click Score: 16    End of Session Equipment Utilized During Treatment: Gait belt Activity Tolerance: Patient tolerated treatment well Patient left: in chair;with call bell/phone within reach;with chair alarm set Nurse Communication: Mobility status PT Visit Diagnosis: Unsteadiness on feet (R26.81);Other abnormalities of gait and mobility (R26.89);Muscle weakness (generalized) (M62.81)     Time: 3343-5686 PT Time Calculation (min) (ACUTE ONLY): 26 min  Charges:  $Gait Training: 8-22 mins $Therapeutic  Exercise: 8-22 mins                     Zigmund Gottron, SPT

## 2022-02-15 NOTE — TOC Transition Note (Signed)
Transition of Care [facility]) - CM/SW Discharge Note   Patient Details  Name: [name] MRN: [id number] Date of Birth: 1934-10-30  Transition of Care [facility]) CM/SW Contact:  Salome Arnt, LCSW Phone Number: 02/15/2022, 12:42 PM   Clinical Narrative:  Pt d/c today. Marjory Lies with [facility] aware of d/c. Orders in. Pt's daughter, Loletha Carrow also notified. No other needs reported. Vickie will pick up pt this afternoon.      Final next level of care: Home w Home Health Services Barriers to Discharge: Barriers Resolved   Patient Goals and CMS Choice Patient states their goals for this hospitalization and ongoing recovery are:: daughter chooses home with [facility] PT   Choice offered to / list presented to : Adult Children  Discharge Placement                  Name of family member notified: Vickie Patient and family notified of of transfer: 02/15/22  Discharge Plan and Services     Post Acute Care Choice: Home Health                    HH Arranged: PT [place]: [place] Date [place]: 02/14/22 Time [place]: [place] Representative spoke with at [place]: Marjory Lies  Social Determinants of Health ([place]) Interventions     Readmission Risk Interventions     No data to display

## 2022-02-15 NOTE — Discharge Summary (Signed)
Physician Discharge Summary   Patient: [name] MRN: [id number] DOB: 1934/08/14  Admit date:     02/12/2022  Discharge date: 02/15/22  Discharge Physician: Shanon Brow Gedalia Mcmillon   PCP: Lindell Spar, MD   Recommendations at discharge:   Please follow up with primary care provider within 1-2 weeks  Please repeat BMP and CBC in one week    Hospital Course: 87 year old female with a history of PE not on anticoagulation secondary to [place], complex partial seizure, TIA, hypertension, hyperlipidemia, colon cancer, hypothyroidism, CKD stage III, impaired glucose tolerance presenting with somnolence, generalized weakness, with cough and congestion for the last 2 days prior to admission.  History is supplemented by the patient's daughter at the bedside.  Daughter states that she had noted some chest congestion and rhinorrhea and occasional cough 2 days prior to admission.  On 02/12/2022, the patient was on the commode and fell asleep.  Her daughter went to check up on her, and the patient had difficulty getting up because of generalized weakness.  Daughter also noticed that the patient was more somnolent and little confused on 02/11/2022.  The patient received her COVID booster on 02/07/2022.  The patient has had low-grade temperatures up to 100.0 F at home.  There is been no vomiting, hemoptysis, worsening shortness of breath, chest pain, abdominal pain, diarrhea, hematochezia, melena. In the ED, the patient was febrile up to 102 20 F with oxygen saturation 97% room air.  She was hemodynamically stable.  WBC 8.1, hemoglobin 13.9, platelets 259,000.  Sodium 144, potassium 3.7, bicarbonate 25, serum creatinine 1.48.  LFTs were unremarkable.  CT of the chest showed mild infiltrates in the right upper lobe, right lower lobe with small right pleural effusion.  The patient was started on ceftriaxone and azithromycin.  Assessment and Plan: Acute metabolic encephalopathy -improved.  Back to  baseline -Secondary to infectious process -UA negative for pyuria -Further workup with no improvement -CT brain negative   Lobar pneumonia -02/12/2022 CT chest--infiltrates RUL, RLL -Continue ceftriaxone and azithromycin -Discharge home with cefdinir and azithromycin x 4 more days   Generalized weakness -Secondary to deconditioning and infectious process -PT eval>>SNF -pt and daughter prefer to go home with HHPT   CKD stage IIIb -Baseline creatinine 1.2-1.5 -Monitor serial BMPs -Serum creatinine 1.11 on the day of discharge   Left lower extremity weakness -No focal weakness on exam 12//2023 -Daughter requesting MRI brain--neg for acute findings   Essential hypertension -Continue Cardizem, metoprolol succinate   History of TIA -Continue Plavix and Crestor   Mixed hyperlipidemia -Continue statin   Hypothyroidism -Continue Synthroid   Complex partial seizure -Continue Keppra   Hypokalemia -repleted -check mag 2.0 -add KCl to IVF          Consultants: none Procedures performed: none  Disposition: Home Diet recommendation:  Regular diet DISCHARGE MEDICATION: Allergies as of 02/15/2022       Reactions   Tape Rash   Zio monitor adhesive causes ulcerated and infected skin .Had to see derm        Medication List     TAKE these medications    acetaminophen 500 MG tablet Commonly known as: TYLENOL Take 500 mg by mouth every 8 (eight) hours as needed for mild pain or headache.   ascorbic acid 250 MG Chew Commonly known as: VITAMIN C Chew 250 mg by mouth daily.   ASPERCREME LIDOCAINE EX Apply 1 application topically daily as needed (for knee pain).   azithromycin 500 MG tablet Commonly known as:  ZITHROMAX Take 1 tablet (500 mg total) by mouth daily.   cefdinir 300 MG capsule Commonly known as: OMNICEF Take 1 capsule (300 mg total) by mouth every 12 (twelve) hours.   cholecalciferol 25 MCG (1000 UNIT) tablet Commonly known as: VITAMIN  D3 Take 1,000 Units by mouth daily.   clopidogrel 75 MG tablet Commonly known as: PLAVIX TAKE ONE TABLET BY MOUTH ONCE DAILY.   diltiazem 30 MG tablet Commonly known as: CARDIZEM Take 1 tablet (30 mg total) by mouth 2 (two) times daily. May take additional 30 mg tablet as needed for palpitations.   gabapentin 300 MG capsule Commonly known as: NEURONTIN Take 300 mg by mouth 3 (three) times daily.   levETIRAcetam 500 MG tablet Commonly known as: KEPPRA Take 250 mg by mouth 2 (two) times daily.   levothyroxine 25 MCG tablet Commonly known as: SYNTHROID Take 1 tablet (25 mcg total) by mouth daily.   Linzess 72 MCG capsule Generic drug: linaclotide TAKE (1) CAPSULE BY MOUTH DAILY AS NEEDED FOR CONSTIPATION. What changed: See the new instructions.   loratadine 10 MG tablet Commonly known as: CLARITIN Take 10 mg by mouth daily.   meclizine 25 MG tablet Commonly known as: ANTIVERT Take 1 tablet (25 mg total) by mouth 3 (three) times daily as needed for dizziness.   metoprolol succinate 100 MG 24 hr tablet Commonly known as: TOPROL-XL Take 1 tablet (100 mg total) by mouth in the morning and at bedtime. Take with or immediately following a meal.   rosuvastatin 40 MG tablet Commonly known as: CRESTOR Take 1 tablet (40 mg total) by mouth daily.   topiramate 50 MG tablet Commonly known as: TOPAMAX Take 50 mg by mouth 2 (two) times daily.   traMADol 50 MG tablet Commonly known as: ULTRAM Take 25 mg by mouth every 8 (eight) hours as needed for moderate pain.   Xiidra 5 % Soln Generic drug: Lifitegrast Apply 1 drop to eye daily as needed (dry eyes).        Discharge Exam: Filed Weights   02/12/22 2122  Weight: 65.9 kg   HEENT:  [place]/AT, No thrush, no icterus CV:  RRR, no rub, no S3, no S4 Lung: Bibasilar crackles.  No wheezing.  Good air movement Abd:  soft/+BS, NT Ext:  No edema, no lymphangitis, no synovitis, no rash   Condition at discharge: stable  The  results of significant diagnostics from this hospitalization (including imaging, microbiology, ancillary and laboratory) are listed below for reference.   Imaging Studies: MR BRAIN WO CONTRAST  Result Date: 02/14/2022 CLINICAL DATA:  Neuro deficit, acute, stroke suspected. Left hemiparesis. EXAM: MRI HEAD WITHOUT CONTRAST TECHNIQUE: Multiplanar, multiecho pulse sequences of the brain and surrounding structures were obtained without intravenous contrast. COMPARISON:  Head CT 2 days ago.  MRI 08/03/2021 FINDINGS: Brain: Diffusion imaging does not show any acute or subacute infarction. There are chronic small-vessel ischemic changes affecting the pons. Few old small vessel cerebellar infarctions. Cerebral hemispheres show volume loss with subjective frontal lobe predominance. Extensive chronic small-vessel ischemic changes affect the thalami, basal ganglia and white matter. No evidence of large vessel territory stroke. There are punctate foci of hemosiderin deposition with some of the old small vessel infarctions, particularly in the thalami. There is ex vacuo enlargement of the frontal horns of the lateral ventricles. No suspicion of obstructive hydrocephalus. Vascular: Major vessels at the base of the brain show flow. Skull and upper cervical spine: Negative Sinuses/Orbits: Mucosal inflammatory changes of the paranasal sinuses.  Orbits negative. Other: None IMPRESSION: 1. No acute intracranial finding. Extensive chronic small-vessel ischemic changes affecting the pons, thalami, basal ganglia and hemispheric white matter. Foci of hemosiderin deposition in some of the old small vessel infarctions, particularly in the thalami. No change since the prior exam. 2. Frontal lobe predominant volume loss. Ex vacuo enlargement of the frontal horns of the lateral ventricles. 3. Mucosal inflammatory changes of the paranasal sinuses. Electronically Signed   By: Nelson Chimes M.D.   On: 02/14/2022 10:11   CT Chest W  Contrast  Result Date: 02/12/2022 CLINICAL DATA:  Abnormal chest x-ray. EXAM: CT CHEST WITH CONTRAST TECHNIQUE: Multidetector CT imaging of the chest was performed during intravenous contrast administration. RADIATION DOSE REDUCTION: This exam was performed according to the departmental dose-optimization program which includes automated exposure control, adjustment of the mA and/or kV according to patient size and/or use of iterative reconstruction technique. CONTRAST:  29m OMNIPAQUE IOHEXOL 300 MG/ML  SOLN COMPARISON:  Chest x-ray February 12, 2022. CT pulmonary angiogram June 01, 2019. FINDINGS: Cardiovascular: The thoracic aorta is nonaneurysmal without significant atherosclerotic change. Cardiomegaly. Central pulmonary arteries are unremarkable. Calcified atherosclerotic changes scattered in the left coronary arteries. Mediastinum/Nodes: Thyroid nodularity is unchanged since the April 26, 2018 CT scan of the chest. This nodule has been previously biopsied and does not require follow-up. A right thyroid lobe is not visualized. The esophagus is normal. No left pleural or pericardial effusions. There is a small right pleural effusion. No adenopathy identified. The chest wall is normal. Lungs/Pleura: Evaluation of the lungs is limited due to respiratory motion. Central airways are normal. Bronchial wall thickening is identified in the bases, right greater than left. Mild opacity in the right lower lobe on series 5, image 69. Mild dependent opacities. Mild opacity in the right upper lobe is identified on coronal image 71 and axial image 47. No infiltrates otherwise identified. The possible spiculated nodule in the left upper lobe on the recent chest x-rays not visualized. This was likely confluence of shadows. No suspicious nodules or masses. Upper Abdomen: Hepatic steatosis. On right renal cysts. No acute abnormalities in the upper abdomen. Musculoskeletal: No chest wall abnormality. No acute or significant  osseous findings. IMPRESSION: 1. Evaluation of the lungs is limited due to respiratory motion. Mild infiltrate is identified in the right upper and lower lobes worrisome for pneumonia. Recommend short-term follow-up imaging after treatment to ensure resolution. 2. Small right pleural effusion. 3. No suspicious nodules or masses. The possible nodule in the left upper lobe on today's chest x-ray was likely confluence of shadows. 4. Cardiomegaly. 5. Coronary artery disease. 6. Hepatic steatosis. Aortic Atherosclerosis (ICD10-I70.0). Electronically Signed   By: DDorise BullionIII M.D.   On: 02/12/2022 17:16   DG Chest Port 1 View  Result Date: 02/12/2022 CLINICAL DATA:  Cough EXAM: PORTABLE CHEST 1 VIEW COMPARISON:  January 06, 2021 FINDINGS: Possible spiculated nodule in the left apex. Stable cardiomegaly. The hila and mediastinum are unchanged. Mild atelectasis in the left base. No other acute abnormalities. IMPRESSION: 1. Possible small spiculated nodule in the left apex. Recommend a CT scan of the chest for better evaluation. 2. No other acute abnormalities. Electronically Signed   By: DDorise BullionIII M.D.   On: 02/12/2022 14:59   CT Head Wo Contrast  Result Date: 02/12/2022 CLINICAL DATA:  Neuro deficit, acute, stroke suspected EXAM: CT HEAD WITHOUT CONTRAST TECHNIQUE: Contiguous axial images were obtained from the base of the skull through the vertex without intravenous contrast.  RADIATION DOSE REDUCTION: This exam was performed according to the departmental dose-optimization program which includes automated exposure control, adjustment of the mA and/or kV according to patient size and/or use of iterative reconstruction technique. COMPARISON:  08/02/2021 FINDINGS: Brain: No evidence of acute infarction, hemorrhage, hydrocephalus, extra-axial collection or mass lesion/mass effect. Extensive low-density changes within the periventricular and subcortical white matter compatible with chronic microvascular  ischemic change. Moderate diffuse cerebral volume loss. Vascular: Atherosclerotic calcifications involving the large vessels of the skull base. No unexpected hyperdense vessel. Skull: Normal. Negative for fracture or focal lesion. Sinuses/Orbits: Mild mucosal thickening of the ethmoid air cells and right maxillary sinus. Small air-fluid level in the right maxillary sinus. Other: None. IMPRESSION: 1. No acute intracranial abnormality. 2. Chronic microvascular ischemic change and cerebral volume loss. 3. Paranasal sinus disease with small air-fluid level in the right maxillary sinus. Correlate for acute sinusitis. Electronically Signed   By: Davina Poke D.O.   On: 02/12/2022 10:19    Microbiology: Results for orders placed or performed during the hospital encounter of 02/12/22  SARS Coronavirus 2 by RT PCR (hospital order, performed in [facility] hospital lab) *cepheid single result test* Anterior Nasal Swab     Status: None   Collection Time: 02/12/22  9:46 AM   Specimen: Anterior Nasal Swab  Result Value Ref Range Status   SARS Coronavirus 2 by RT PCR NEGATIVE NEGATIVE Final    Comment: (NOTE) SARS-CoV-2 target nucleic acids are NOT DETECTED.  The SARS-CoV-2 RNA is generally detectable in upper and lower respiratory specimens during the acute phase of infection. The lowest concentration of SARS-CoV-2 viral copies this assay can detect is 250 copies / mL. A negative result does not preclude SARS-CoV-2 infection and should not be used as the sole basis for treatment or other patient management decisions.  A negative result may occur with improper specimen collection / handling, submission of specimen other than nasopharyngeal swab, presence of viral mutation(s) within the areas targeted by this assay, and inadequate number of viral copies (<250 copies / mL). A negative result must be combined with clinical observations, patient history, and epidemiological information.  Fact Sheet for  Patients:   https://www.patel.info/  Fact Sheet for Healthcare Providers: https://hall.com/  This test is not yet approved or  cleared by the [place] FDA and has been authorized for detection and/or diagnosis of SARS-CoV-2 by FDA under an Emergency Use Authorization (EUA).  This EUA will remain in effect (meaning this test can be used) for the duration of the COVID-19 declaration under Section 564(b)(1) of the Act, 21 U.S.C. section 360bbb-3(b)(1), unless the authorization is terminated or revoked sooner.  Performed at [facility], [address], [place], [place] [address]   Resp Panel by RT-PCR (Flu A&B, Covid) Anterior Nasal Swab     Status: None   Collection Time: 02/12/22  1:35 PM   Specimen: Anterior Nasal Swab  Result Value Ref Range Status   SARS Coronavirus 2 by RT PCR NEGATIVE NEGATIVE Final    Comment: (NOTE) SARS-CoV-2 target nucleic acids are NOT DETECTED.  The SARS-CoV-2 RNA is generally detectable in upper respiratory specimens during the acute phase of infection. The lowest concentration of SARS-CoV-2 viral copies this assay can detect is 138 copies/mL. A negative result does not preclude SARS-Cov-2 infection and should not be used as the sole basis for treatment or other patient management decisions. A negative result may occur with  improper specimen collection/handling, submission of specimen other than nasopharyngeal swab, presence of  viral mutation(s) within the areas targeted by this assay, and inadequate number of viral copies(<138 copies/mL). A negative result must be combined with clinical observations, patient history, and epidemiological information. The expected result is Negative.  Fact Sheet for Patients:  EntrepreneurPulse.com.au  Fact Sheet for Healthcare Providers:  IncredibleEmployment.be  This test is no t yet approved or cleared by the [place] FDA and   has been authorized for detection and/or diagnosis of SARS-CoV-2 by FDA under an Emergency Use Authorization (EUA). This EUA will remain  in effect (meaning this test can be used) for the duration of the COVID-19 declaration under Section 564(b)(1) of the Act, 21 U.S.C.section 360bbb-3(b)(1), unless the authorization is terminated  or revoked sooner.       Influenza A by PCR NEGATIVE NEGATIVE Final   Influenza B by PCR NEGATIVE NEGATIVE Final    Comment: (NOTE) The Xpert Xpress SARS-CoV-2/FLU/RSV plus assay is intended as an aid in the diagnosis of influenza from Nasopharyngeal swab specimens and should not be used as a sole basis for treatment. Nasal washings and aspirates are unacceptable for Xpert Xpress SARS-CoV-2/FLU/RSV testing.  Fact Sheet for Patients: EntrepreneurPulse.com.au  Fact Sheet for Healthcare Providers: IncredibleEmployment.be  This test is not yet approved or cleared by the [place] FDA and has been authorized for detection and/or diagnosis of SARS-CoV-2 by FDA under an Emergency Use Authorization (EUA). This EUA will remain in effect (meaning this test can be used) for the duration of the COVID-19 declaration under Section 564(b)(1) of the Act, 21 U.S.C. section 360bbb-3(b)(1), unless the authorization is terminated or revoked.  Performed at [facility], [address], [place], [place] [address]   Culture, blood (routine x 2) Call MD if unable to obtain prior to antibiotics being given     Status: None (Preliminary result)   Collection Time: 02/12/22  7:54 PM   Specimen: Left Antecubital; Blood  Result Value Ref Range Status   Specimen Description   Final    LEFT ANTECUBITAL BOTTLES DRAWN AEROBIC AND ANAEROBIC   Special Requests Blood Culture adequate volume  Final   Culture   Final    NO GROWTH 3 DAYS Performed at [facility], [address] [place], [place] [address]    Report Status PENDING  Incomplete     Labs: CBC: Recent Labs  Lab 02/12/22 0910 02/13/22 0451 02/14/22 0701  WBC 8.1 8.4 10.3  NEUTROABS 6.3  --   --   HGB 13.9 13.9 13.5  HCT 43.3 42.8 41.8  MCV 98.0 96.0 97.7  PLT 259 230 431   Basic Metabolic Panel: Recent Labs  Lab 02/12/22 0910 02/13/22 0451 02/14/22 0701 02/15/22 0536  NA 144 141 143 145  K 3.7 2.5* 3.6 3.8  CL 111 113* 113* 119*  CO2 25 17* 22 20*  GLUCOSE 114* 127* 119* 103*  BUN '14 18 19 14  '$ CREATININE 1.48* 1.42* 1.41* 1.11*  CALCIUM 9.3 8.8* 8.5* 8.5*  MG  --   --  2.0 2.0   Liver Function Tests: Recent Labs  Lab 02/12/22 0910  AST 17  ALT 14  ALKPHOS 83  BILITOT 0.5  PROT 8.2*  ALBUMIN 4.0   CBG: No results for input(s): "GLUCAP" in the last 168 hours.  Discharge time spent: greater than 30 minutes.  Signed: Orson Eva, MD [facility] 02/15/2022

## 2022-02-15 NOTE — Progress Notes (Signed)
Patient stable and ready for discharge home. Patient's daughter is here to take patient home. Patient's IV removed. Writer went over discharge paperwork with daughter and she verbalized understanding. NT helped to dress patient. NT transported patient to car via [place].

## 2022-02-15 NOTE — Care Management Important Message (Signed)
Important Message  Patient Details  Name: [name] MRN: [id number] Date of Birth: 07/24/1934   Medicare Important Message Given:  N/A - LOS <3 / Initial given by admissions     Tommy Medal 02/15/2022, 12:00 PM

## 2022-02-17 ENCOUNTER — Telehealth: Payer: Self-pay

## 2022-02-17 ENCOUNTER — Ambulatory Visit: Payer: Medicare Other | Admitting: Neurology

## 2022-02-17 LAB — CULTURE, BLOOD (ROUTINE X 2)
Culture: NO GROWTH
Special Requests: ADEQUATE

## 2022-02-17 NOTE — Patient Outreach (Signed)
  Care Coordination TOC Note Transition Care Management Follow-up Telephone Call Date of discharge and from where: [facility] 02/12/22-02/15/22 How have you been since you were released from the hospital? Per patient's daughter, she is doing well, still weak. Any questions or concerns? No  Items Reviewed: Did the pt receive and understand the discharge instructions provided? Yes  Medications obtained and verified? Yes  Other? No  Any new allergies since your discharge? No  Dietary orders reviewed? Yes Do you have support at home? Yes   Home Care and Equipment/Supplies: Were home health services ordered? yes If so, what is the name of the agency? [facility]  Has the agency set up a time to come to the patient's home? No-but they have called Were any new equipment or medical supplies ordered?  No What is the name of the medical supply agency? N/a Were you able to get the supplies/equipment? not applicable Do you have any questions related to the use of the equipment or supplies? No  Functional Questionnaire: (I = Independent and D = Dependent) ADLs: Needs assistance  Bathing/Dressing- Needs assistance  Meal Prep- Needs assistance  Eating- I  Maintaining continence- I  Transferring/Ambulation- Needs assistance  Managing Meds- D  Follow up appointments reviewed:  PCP Hospital f/u appt confirmed? No   Specialist Hospital f/u appt confirmed? No   Are transportation arrangements needed? No  If their condition worsens, is the pt aware to call PCP or go to the Emergency Dept.? Yes Was the patient provided with contact information for the PCP's office or ED? Yes Was to pt encouraged to call back with questions or concerns? Yes  SDOH assessments and interventions completed:   Yes SDOH Interventions Today    Flowsheet Row Most Recent Value  SDOH Interventions   Housing Interventions Intervention Not Indicated  Transportation Interventions Intervention Not Indicated        Care Coordination Interventions:  PCP follow up appointment requested   Encounter Outcome:  Pt. Visit Completed

## 2022-02-19 DIAGNOSIS — N1832 Chronic kidney disease, stage 3b: Secondary | ICD-10-CM | POA: Diagnosis not present

## 2022-02-19 DIAGNOSIS — R7301 Impaired fasting glucose: Secondary | ICD-10-CM | POA: Diagnosis not present

## 2022-02-19 DIAGNOSIS — E782 Mixed hyperlipidemia: Secondary | ICD-10-CM | POA: Diagnosis not present

## 2022-02-19 DIAGNOSIS — I129 Hypertensive chronic kidney disease with stage 1 through stage 4 chronic kidney disease, or unspecified chronic kidney disease: Secondary | ICD-10-CM | POA: Diagnosis not present

## 2022-02-19 DIAGNOSIS — I493 Ventricular premature depolarization: Secondary | ICD-10-CM | POA: Diagnosis not present

## 2022-02-19 DIAGNOSIS — Z85038 Personal history of other malignant neoplasm of large intestine: Secondary | ICD-10-CM | POA: Diagnosis not present

## 2022-02-19 DIAGNOSIS — K5900 Constipation, unspecified: Secondary | ICD-10-CM | POA: Diagnosis not present

## 2022-02-19 DIAGNOSIS — G9340 Encephalopathy, unspecified: Secondary | ICD-10-CM | POA: Diagnosis not present

## 2022-02-19 DIAGNOSIS — I69354 Hemiplegia and hemiparesis following cerebral infarction affecting left non-dominant side: Secondary | ICD-10-CM | POA: Diagnosis not present

## 2022-02-19 DIAGNOSIS — G4700 Insomnia, unspecified: Secondary | ICD-10-CM | POA: Diagnosis not present

## 2022-02-19 DIAGNOSIS — Z86718 Personal history of other venous thrombosis and embolism: Secondary | ICD-10-CM | POA: Diagnosis not present

## 2022-02-19 DIAGNOSIS — M5030 Other cervical disc degeneration, unspecified cervical region: Secondary | ICD-10-CM | POA: Diagnosis not present

## 2022-02-19 DIAGNOSIS — E039 Hypothyroidism, unspecified: Secondary | ICD-10-CM | POA: Diagnosis not present

## 2022-02-19 DIAGNOSIS — N2889 Other specified disorders of kidney and ureter: Secondary | ICD-10-CM | POA: Diagnosis not present

## 2022-02-19 DIAGNOSIS — Z7902 Long term (current) use of antithrombotics/antiplatelets: Secondary | ICD-10-CM | POA: Diagnosis not present

## 2022-02-19 DIAGNOSIS — M1990 Unspecified osteoarthritis, unspecified site: Secondary | ICD-10-CM | POA: Diagnosis not present

## 2022-02-19 DIAGNOSIS — Z9181 History of falling: Secondary | ICD-10-CM | POA: Diagnosis not present

## 2022-02-19 DIAGNOSIS — G8929 Other chronic pain: Secondary | ICD-10-CM | POA: Diagnosis not present

## 2022-02-21 ENCOUNTER — Ambulatory Visit: Payer: Medicare Other | Admitting: Hematology

## 2022-02-22 ENCOUNTER — Telehealth: Payer: Self-pay | Admitting: Internal Medicine

## 2022-02-22 NOTE — Telephone Encounter (Signed)
Left message for [facility]

## 2022-02-22 NOTE — Telephone Encounter (Signed)
error 

## 2022-02-22 NOTE — Telephone Encounter (Signed)
Humphrey Rolls, physical therapist w. [facility] called in on patient.  Requesting PT frequency of   1 week 1  2 week 4  1 week 4   Call back # [phone]

## 2022-02-23 ENCOUNTER — Ambulatory Visit: Payer: Medicare Other | Admitting: Hematology

## 2022-02-23 DIAGNOSIS — M5030 Other cervical disc degeneration, unspecified cervical region: Secondary | ICD-10-CM | POA: Diagnosis not present

## 2022-02-23 DIAGNOSIS — Z7902 Long term (current) use of antithrombotics/antiplatelets: Secondary | ICD-10-CM | POA: Diagnosis not present

## 2022-02-23 DIAGNOSIS — N1832 Chronic kidney disease, stage 3b: Secondary | ICD-10-CM | POA: Diagnosis not present

## 2022-02-23 DIAGNOSIS — M1990 Unspecified osteoarthritis, unspecified site: Secondary | ICD-10-CM | POA: Diagnosis not present

## 2022-02-23 DIAGNOSIS — I493 Ventricular premature depolarization: Secondary | ICD-10-CM | POA: Diagnosis not present

## 2022-02-23 DIAGNOSIS — I129 Hypertensive chronic kidney disease with stage 1 through stage 4 chronic kidney disease, or unspecified chronic kidney disease: Secondary | ICD-10-CM | POA: Diagnosis not present

## 2022-02-23 DIAGNOSIS — K5900 Constipation, unspecified: Secondary | ICD-10-CM | POA: Diagnosis not present

## 2022-02-23 DIAGNOSIS — Z9181 History of falling: Secondary | ICD-10-CM | POA: Diagnosis not present

## 2022-02-23 DIAGNOSIS — G8929 Other chronic pain: Secondary | ICD-10-CM | POA: Diagnosis not present

## 2022-02-23 DIAGNOSIS — Z85038 Personal history of other malignant neoplasm of large intestine: Secondary | ICD-10-CM | POA: Diagnosis not present

## 2022-02-23 DIAGNOSIS — N2889 Other specified disorders of kidney and ureter: Secondary | ICD-10-CM | POA: Diagnosis not present

## 2022-02-23 DIAGNOSIS — G9340 Encephalopathy, unspecified: Secondary | ICD-10-CM | POA: Diagnosis not present

## 2022-02-23 DIAGNOSIS — R7301 Impaired fasting glucose: Secondary | ICD-10-CM | POA: Diagnosis not present

## 2022-02-23 DIAGNOSIS — I69354 Hemiplegia and hemiparesis following cerebral infarction affecting left non-dominant side: Secondary | ICD-10-CM | POA: Diagnosis not present

## 2022-02-23 DIAGNOSIS — G4700 Insomnia, unspecified: Secondary | ICD-10-CM | POA: Diagnosis not present

## 2022-02-23 DIAGNOSIS — Z86718 Personal history of other venous thrombosis and embolism: Secondary | ICD-10-CM | POA: Diagnosis not present

## 2022-02-23 DIAGNOSIS — E782 Mixed hyperlipidemia: Secondary | ICD-10-CM | POA: Diagnosis not present

## 2022-02-23 DIAGNOSIS — E039 Hypothyroidism, unspecified: Secondary | ICD-10-CM | POA: Diagnosis not present

## 2022-02-25 DIAGNOSIS — N2889 Other specified disorders of kidney and ureter: Secondary | ICD-10-CM | POA: Diagnosis not present

## 2022-02-25 DIAGNOSIS — R7301 Impaired fasting glucose: Secondary | ICD-10-CM | POA: Diagnosis not present

## 2022-02-25 DIAGNOSIS — G9340 Encephalopathy, unspecified: Secondary | ICD-10-CM | POA: Diagnosis not present

## 2022-02-25 DIAGNOSIS — K5900 Constipation, unspecified: Secondary | ICD-10-CM | POA: Diagnosis not present

## 2022-02-25 DIAGNOSIS — Z7902 Long term (current) use of antithrombotics/antiplatelets: Secondary | ICD-10-CM | POA: Diagnosis not present

## 2022-02-25 DIAGNOSIS — G8929 Other chronic pain: Secondary | ICD-10-CM | POA: Diagnosis not present

## 2022-02-25 DIAGNOSIS — Z86718 Personal history of other venous thrombosis and embolism: Secondary | ICD-10-CM | POA: Diagnosis not present

## 2022-02-25 DIAGNOSIS — E039 Hypothyroidism, unspecified: Secondary | ICD-10-CM | POA: Diagnosis not present

## 2022-02-25 DIAGNOSIS — N1832 Chronic kidney disease, stage 3b: Secondary | ICD-10-CM | POA: Diagnosis not present

## 2022-02-25 DIAGNOSIS — I493 Ventricular premature depolarization: Secondary | ICD-10-CM | POA: Diagnosis not present

## 2022-02-25 DIAGNOSIS — M1990 Unspecified osteoarthritis, unspecified site: Secondary | ICD-10-CM | POA: Diagnosis not present

## 2022-02-25 DIAGNOSIS — I69354 Hemiplegia and hemiparesis following cerebral infarction affecting left non-dominant side: Secondary | ICD-10-CM | POA: Diagnosis not present

## 2022-02-25 DIAGNOSIS — Z9181 History of falling: Secondary | ICD-10-CM | POA: Diagnosis not present

## 2022-02-25 DIAGNOSIS — I129 Hypertensive chronic kidney disease with stage 1 through stage 4 chronic kidney disease, or unspecified chronic kidney disease: Secondary | ICD-10-CM | POA: Diagnosis not present

## 2022-02-25 DIAGNOSIS — E782 Mixed hyperlipidemia: Secondary | ICD-10-CM | POA: Diagnosis not present

## 2022-02-25 DIAGNOSIS — Z85038 Personal history of other malignant neoplasm of large intestine: Secondary | ICD-10-CM | POA: Diagnosis not present

## 2022-02-25 DIAGNOSIS — G4700 Insomnia, unspecified: Secondary | ICD-10-CM | POA: Diagnosis not present

## 2022-02-25 DIAGNOSIS — M5030 Other cervical disc degeneration, unspecified cervical region: Secondary | ICD-10-CM | POA: Diagnosis not present

## 2022-02-28 DIAGNOSIS — M5030 Other cervical disc degeneration, unspecified cervical region: Secondary | ICD-10-CM | POA: Diagnosis not present

## 2022-02-28 DIAGNOSIS — Z86718 Personal history of other venous thrombosis and embolism: Secondary | ICD-10-CM | POA: Diagnosis not present

## 2022-02-28 DIAGNOSIS — Z7902 Long term (current) use of antithrombotics/antiplatelets: Secondary | ICD-10-CM | POA: Diagnosis not present

## 2022-02-28 DIAGNOSIS — I129 Hypertensive chronic kidney disease with stage 1 through stage 4 chronic kidney disease, or unspecified chronic kidney disease: Secondary | ICD-10-CM | POA: Diagnosis not present

## 2022-02-28 DIAGNOSIS — I493 Ventricular premature depolarization: Secondary | ICD-10-CM | POA: Diagnosis not present

## 2022-02-28 DIAGNOSIS — N1832 Chronic kidney disease, stage 3b: Secondary | ICD-10-CM | POA: Diagnosis not present

## 2022-02-28 DIAGNOSIS — K5900 Constipation, unspecified: Secondary | ICD-10-CM | POA: Diagnosis not present

## 2022-02-28 DIAGNOSIS — G4700 Insomnia, unspecified: Secondary | ICD-10-CM | POA: Diagnosis not present

## 2022-02-28 DIAGNOSIS — Z85038 Personal history of other malignant neoplasm of large intestine: Secondary | ICD-10-CM | POA: Diagnosis not present

## 2022-02-28 DIAGNOSIS — R7301 Impaired fasting glucose: Secondary | ICD-10-CM | POA: Diagnosis not present

## 2022-02-28 DIAGNOSIS — G8929 Other chronic pain: Secondary | ICD-10-CM | POA: Diagnosis not present

## 2022-02-28 DIAGNOSIS — N2889 Other specified disorders of kidney and ureter: Secondary | ICD-10-CM | POA: Diagnosis not present

## 2022-02-28 DIAGNOSIS — I69354 Hemiplegia and hemiparesis following cerebral infarction affecting left non-dominant side: Secondary | ICD-10-CM | POA: Diagnosis not present

## 2022-02-28 DIAGNOSIS — M1990 Unspecified osteoarthritis, unspecified site: Secondary | ICD-10-CM | POA: Diagnosis not present

## 2022-02-28 DIAGNOSIS — Z9181 History of falling: Secondary | ICD-10-CM | POA: Diagnosis not present

## 2022-02-28 DIAGNOSIS — E782 Mixed hyperlipidemia: Secondary | ICD-10-CM | POA: Diagnosis not present

## 2022-02-28 DIAGNOSIS — E039 Hypothyroidism, unspecified: Secondary | ICD-10-CM | POA: Diagnosis not present

## 2022-02-28 DIAGNOSIS — G9340 Encephalopathy, unspecified: Secondary | ICD-10-CM | POA: Diagnosis not present

## 2022-03-01 ENCOUNTER — Encounter: Payer: Self-pay | Admitting: Internal Medicine

## 2022-03-01 ENCOUNTER — Ambulatory Visit (INDEPENDENT_AMBULATORY_CARE_PROVIDER_SITE_OTHER): Payer: Medicare Other | Admitting: Internal Medicine

## 2022-03-01 VITALS — BP 166/80 | HR 63 | Resp 16 | Ht 64.0 in | Wt 152.0 lb

## 2022-03-01 DIAGNOSIS — J988 Other specified respiratory disorders: Secondary | ICD-10-CM | POA: Diagnosis not present

## 2022-03-01 DIAGNOSIS — I10 Essential (primary) hypertension: Secondary | ICD-10-CM | POA: Diagnosis not present

## 2022-03-01 MED ORDER — GUAIFENESIN 100 MG/5ML PO LIQD: 15.0000 mL | ORAL | 0 refills | Status: DC | PRN

## 2022-03-01 NOTE — Patient Instructions (Signed)
Thank you, Ms.Paddy B Rolison for allowing [place] to provide your care today. Today we discussed how you were doing since hospital admission, congestion in your throat , and fullness in left ear.    I have ordered the following labs for you:  Lab Orders  No laboratory test(s) ordered today     Tests ordered today:  None  Referrals ordered today:   Referral Orders  No referral(s) requested today     I have ordered the following medication/changed the following medications:   Stop the following medications: There are no discontinued medications.   Start the following medications: Meds ordered this encounter  Medications   guaiFENesin (ROBITUSSIN) 100 MG/5ML liquid    Sig: Take 15 mLs by mouth every 4 (four) hours as needed for cough or to loosen phlegm.    Dispense:  120 mL    Refill:  0     Follow up:  With Dr.Patel as scheduled in May      Tamsen Snider, M.D.

## 2022-03-01 NOTE — Progress Notes (Signed)
     CC: Transitions Of Care (Discharged from [facility] on 12/5 with pneumonia )    HPI:Ms.[name] is a 87 y.o. female who presents for evaluation after hospitalization for pneumonia. She is working with PT and regaining her strength. She is not short of breath today. She does have some congestion in her chest which started with pneumonia. In addition she has had some fullness in her left ear.    Past Medical History:  Diagnosis Date   Allergy    Arthritis    Colon cancer ([place])    colon ca dx 07/30/09   History of cardiac monitoring 07/2017   "Event monitor demonstrated sinus rhythm with isolated PACs and no arrhythmias"   History of colon cancer 06/2009   found at time of TCS 06/29/09, 1.2cm sessile cecal polyp, no adjuvent therapy needed   HTN (hypertension)    Hx of cardiovascular stress test 07/2017   "No diagnostic ST segment changes to indicate ischemia. Small, moderate intensity, reversible apical to basal inferolateral defect consistent with ischemia. This is a low risk study. Nuclear stress EF: 84%."   Hyperlipidemia    Hypothyroidism    PE (pulmonary thromboembolism) ([place])    Renal disorder    cyst on kidney    Silent micro-hemorrhage of brain ([place]) 11/25/2018   Stroke ([place])    TIA (transient ischemic attack) 11/25/2018   Vertigo      Physical Exam: Vitals:   03/01/22 1442  BP: (!) 166/80  Pulse: 63  Resp: 16  SpO2: 94%  Weight: 152 lb (68.9 kg)  Height: '5\' 4"'$  (1.626 m)     Physical Exam Constitutional:      General: She is not in acute distress.    Appearance: She is normal weight.  HENT:     Right Ear: Tympanic membrane normal.     Left Ear: Tympanic membrane normal.  Eyes:     General: No scleral icterus.    Conjunctiva/sclera: Conjunctivae normal.  Cardiovascular:     Rate and Rhythm: Normal rate and regular rhythm.     Heart sounds: No murmur heard.    No friction rub.  Pulmonary:     Effort: Pulmonary effort is normal.      Breath sounds: No wheezing, rhonchi or rales.      Assessment & Plan:   Essential hypertension BP elevated today 166/80. Patient is taking her metoprolol and Cardizem. Previously controlled on this regimen before admission to hospital for pneumonia.  Recommended for patient and daughter to continue monitoring at home, no changes today.   Congestion of respiratory tract Patient has feeling of something in her throat, but unable to cough it up. No difficutly swallowing liquids or solids. No shortness of breath. - guaiFENesin (ROBITUSSIN) 100 MG/5ML liquid; Take 15 mLs by mouth every 4 (four) hours as needed for cough or to loosen phlegm.  Dispense: 120 mL; Refill: 0    Lorene Dy, MD

## 2022-03-01 NOTE — Assessment & Plan Note (Signed)
BP elevated today 166/80. Patient is taking her metoprolol and Cardizem. Previously controlled on this regimen before admission to hospital for pneumonia.  Recommended for patient and daughter to continue monitoring at home, no changes today.

## 2022-03-01 NOTE — Assessment & Plan Note (Signed)
Patient has feeling of something in her throat, but unable to cough it up. No difficutly swallowing liquids or solids. No shortness of breath. - guaiFENesin (ROBITUSSIN) 100 MG/5ML liquid; Take 15 mLs by mouth every 4 (four) hours as needed for cough or to loosen phlegm.  Dispense: 120 mL; Refill: 0

## 2022-03-03 DIAGNOSIS — R7301 Impaired fasting glucose: Secondary | ICD-10-CM | POA: Diagnosis not present

## 2022-03-03 DIAGNOSIS — G4700 Insomnia, unspecified: Secondary | ICD-10-CM | POA: Diagnosis not present

## 2022-03-03 DIAGNOSIS — E782 Mixed hyperlipidemia: Secondary | ICD-10-CM | POA: Diagnosis not present

## 2022-03-03 DIAGNOSIS — E039 Hypothyroidism, unspecified: Secondary | ICD-10-CM | POA: Diagnosis not present

## 2022-03-03 DIAGNOSIS — I493 Ventricular premature depolarization: Secondary | ICD-10-CM | POA: Diagnosis not present

## 2022-03-03 DIAGNOSIS — Z9181 History of falling: Secondary | ICD-10-CM | POA: Diagnosis not present

## 2022-03-03 DIAGNOSIS — Z7902 Long term (current) use of antithrombotics/antiplatelets: Secondary | ICD-10-CM | POA: Diagnosis not present

## 2022-03-03 DIAGNOSIS — G9340 Encephalopathy, unspecified: Secondary | ICD-10-CM | POA: Diagnosis not present

## 2022-03-03 DIAGNOSIS — I69354 Hemiplegia and hemiparesis following cerebral infarction affecting left non-dominant side: Secondary | ICD-10-CM | POA: Diagnosis not present

## 2022-03-03 DIAGNOSIS — M1990 Unspecified osteoarthritis, unspecified site: Secondary | ICD-10-CM | POA: Diagnosis not present

## 2022-03-03 DIAGNOSIS — N2889 Other specified disorders of kidney and ureter: Secondary | ICD-10-CM | POA: Diagnosis not present

## 2022-03-03 DIAGNOSIS — N1832 Chronic kidney disease, stage 3b: Secondary | ICD-10-CM | POA: Diagnosis not present

## 2022-03-03 DIAGNOSIS — I129 Hypertensive chronic kidney disease with stage 1 through stage 4 chronic kidney disease, or unspecified chronic kidney disease: Secondary | ICD-10-CM | POA: Diagnosis not present

## 2022-03-03 DIAGNOSIS — Z85038 Personal history of other malignant neoplasm of large intestine: Secondary | ICD-10-CM | POA: Diagnosis not present

## 2022-03-03 DIAGNOSIS — Z86718 Personal history of other venous thrombosis and embolism: Secondary | ICD-10-CM | POA: Diagnosis not present

## 2022-03-03 DIAGNOSIS — M5030 Other cervical disc degeneration, unspecified cervical region: Secondary | ICD-10-CM | POA: Diagnosis not present

## 2022-03-03 DIAGNOSIS — G8929 Other chronic pain: Secondary | ICD-10-CM | POA: Diagnosis not present

## 2022-03-03 DIAGNOSIS — K5900 Constipation, unspecified: Secondary | ICD-10-CM | POA: Diagnosis not present

## 2022-03-08 DIAGNOSIS — K5900 Constipation, unspecified: Secondary | ICD-10-CM | POA: Diagnosis not present

## 2022-03-08 DIAGNOSIS — I493 Ventricular premature depolarization: Secondary | ICD-10-CM | POA: Diagnosis not present

## 2022-03-08 DIAGNOSIS — G8929 Other chronic pain: Secondary | ICD-10-CM | POA: Diagnosis not present

## 2022-03-08 DIAGNOSIS — G4700 Insomnia, unspecified: Secondary | ICD-10-CM | POA: Diagnosis not present

## 2022-03-08 DIAGNOSIS — Z7902 Long term (current) use of antithrombotics/antiplatelets: Secondary | ICD-10-CM | POA: Diagnosis not present

## 2022-03-08 DIAGNOSIS — Z9181 History of falling: Secondary | ICD-10-CM | POA: Diagnosis not present

## 2022-03-08 DIAGNOSIS — I69354 Hemiplegia and hemiparesis following cerebral infarction affecting left non-dominant side: Secondary | ICD-10-CM | POA: Diagnosis not present

## 2022-03-08 DIAGNOSIS — R7301 Impaired fasting glucose: Secondary | ICD-10-CM | POA: Diagnosis not present

## 2022-03-08 DIAGNOSIS — M1990 Unspecified osteoarthritis, unspecified site: Secondary | ICD-10-CM | POA: Diagnosis not present

## 2022-03-08 DIAGNOSIS — Z86718 Personal history of other venous thrombosis and embolism: Secondary | ICD-10-CM | POA: Diagnosis not present

## 2022-03-08 DIAGNOSIS — M5030 Other cervical disc degeneration, unspecified cervical region: Secondary | ICD-10-CM | POA: Diagnosis not present

## 2022-03-08 DIAGNOSIS — E782 Mixed hyperlipidemia: Secondary | ICD-10-CM | POA: Diagnosis not present

## 2022-03-08 DIAGNOSIS — G9340 Encephalopathy, unspecified: Secondary | ICD-10-CM | POA: Diagnosis not present

## 2022-03-08 DIAGNOSIS — N2889 Other specified disorders of kidney and ureter: Secondary | ICD-10-CM | POA: Diagnosis not present

## 2022-03-08 DIAGNOSIS — I129 Hypertensive chronic kidney disease with stage 1 through stage 4 chronic kidney disease, or unspecified chronic kidney disease: Secondary | ICD-10-CM | POA: Diagnosis not present

## 2022-03-08 DIAGNOSIS — Z85038 Personal history of other malignant neoplasm of large intestine: Secondary | ICD-10-CM | POA: Diagnosis not present

## 2022-03-08 DIAGNOSIS — E039 Hypothyroidism, unspecified: Secondary | ICD-10-CM | POA: Diagnosis not present

## 2022-03-08 DIAGNOSIS — N1832 Chronic kidney disease, stage 3b: Secondary | ICD-10-CM | POA: Diagnosis not present

## 2022-03-10 DIAGNOSIS — G9340 Encephalopathy, unspecified: Secondary | ICD-10-CM | POA: Diagnosis not present

## 2022-03-10 DIAGNOSIS — Z7902 Long term (current) use of antithrombotics/antiplatelets: Secondary | ICD-10-CM | POA: Diagnosis not present

## 2022-03-10 DIAGNOSIS — R7301 Impaired fasting glucose: Secondary | ICD-10-CM | POA: Diagnosis not present

## 2022-03-10 DIAGNOSIS — N2889 Other specified disorders of kidney and ureter: Secondary | ICD-10-CM | POA: Diagnosis not present

## 2022-03-10 DIAGNOSIS — K5900 Constipation, unspecified: Secondary | ICD-10-CM | POA: Diagnosis not present

## 2022-03-10 DIAGNOSIS — I493 Ventricular premature depolarization: Secondary | ICD-10-CM | POA: Diagnosis not present

## 2022-03-10 DIAGNOSIS — E782 Mixed hyperlipidemia: Secondary | ICD-10-CM | POA: Diagnosis not present

## 2022-03-10 DIAGNOSIS — G4700 Insomnia, unspecified: Secondary | ICD-10-CM | POA: Diagnosis not present

## 2022-03-10 DIAGNOSIS — E039 Hypothyroidism, unspecified: Secondary | ICD-10-CM | POA: Diagnosis not present

## 2022-03-10 DIAGNOSIS — M5030 Other cervical disc degeneration, unspecified cervical region: Secondary | ICD-10-CM | POA: Diagnosis not present

## 2022-03-10 DIAGNOSIS — Z86718 Personal history of other venous thrombosis and embolism: Secondary | ICD-10-CM | POA: Diagnosis not present

## 2022-03-10 DIAGNOSIS — Z85038 Personal history of other malignant neoplasm of large intestine: Secondary | ICD-10-CM | POA: Diagnosis not present

## 2022-03-10 DIAGNOSIS — Z9181 History of falling: Secondary | ICD-10-CM | POA: Diagnosis not present

## 2022-03-10 DIAGNOSIS — N1832 Chronic kidney disease, stage 3b: Secondary | ICD-10-CM | POA: Diagnosis not present

## 2022-03-10 DIAGNOSIS — I129 Hypertensive chronic kidney disease with stage 1 through stage 4 chronic kidney disease, or unspecified chronic kidney disease: Secondary | ICD-10-CM | POA: Diagnosis not present

## 2022-03-10 DIAGNOSIS — I69354 Hemiplegia and hemiparesis following cerebral infarction affecting left non-dominant side: Secondary | ICD-10-CM | POA: Diagnosis not present

## 2022-03-10 DIAGNOSIS — G8929 Other chronic pain: Secondary | ICD-10-CM | POA: Diagnosis not present

## 2022-03-10 DIAGNOSIS — M1990 Unspecified osteoarthritis, unspecified site: Secondary | ICD-10-CM | POA: Diagnosis not present

## 2022-03-15 DIAGNOSIS — E039 Hypothyroidism, unspecified: Secondary | ICD-10-CM | POA: Diagnosis not present

## 2022-03-15 DIAGNOSIS — M5030 Other cervical disc degeneration, unspecified cervical region: Secondary | ICD-10-CM | POA: Diagnosis not present

## 2022-03-15 DIAGNOSIS — Z7902 Long term (current) use of antithrombotics/antiplatelets: Secondary | ICD-10-CM | POA: Diagnosis not present

## 2022-03-15 DIAGNOSIS — G8929 Other chronic pain: Secondary | ICD-10-CM | POA: Diagnosis not present

## 2022-03-15 DIAGNOSIS — G4700 Insomnia, unspecified: Secondary | ICD-10-CM | POA: Diagnosis not present

## 2022-03-15 DIAGNOSIS — I129 Hypertensive chronic kidney disease with stage 1 through stage 4 chronic kidney disease, or unspecified chronic kidney disease: Secondary | ICD-10-CM | POA: Diagnosis not present

## 2022-03-15 DIAGNOSIS — E782 Mixed hyperlipidemia: Secondary | ICD-10-CM | POA: Diagnosis not present

## 2022-03-15 DIAGNOSIS — R7301 Impaired fasting glucose: Secondary | ICD-10-CM | POA: Diagnosis not present

## 2022-03-15 DIAGNOSIS — M1990 Unspecified osteoarthritis, unspecified site: Secondary | ICD-10-CM | POA: Diagnosis not present

## 2022-03-15 DIAGNOSIS — I493 Ventricular premature depolarization: Secondary | ICD-10-CM | POA: Diagnosis not present

## 2022-03-15 DIAGNOSIS — Z9181 History of falling: Secondary | ICD-10-CM | POA: Diagnosis not present

## 2022-03-15 DIAGNOSIS — I69354 Hemiplegia and hemiparesis following cerebral infarction affecting left non-dominant side: Secondary | ICD-10-CM | POA: Diagnosis not present

## 2022-03-15 DIAGNOSIS — Z86718 Personal history of other venous thrombosis and embolism: Secondary | ICD-10-CM | POA: Diagnosis not present

## 2022-03-15 DIAGNOSIS — N2889 Other specified disorders of kidney and ureter: Secondary | ICD-10-CM | POA: Diagnosis not present

## 2022-03-15 DIAGNOSIS — Z85038 Personal history of other malignant neoplasm of large intestine: Secondary | ICD-10-CM | POA: Diagnosis not present

## 2022-03-15 DIAGNOSIS — N1832 Chronic kidney disease, stage 3b: Secondary | ICD-10-CM | POA: Diagnosis not present

## 2022-03-15 DIAGNOSIS — K5900 Constipation, unspecified: Secondary | ICD-10-CM | POA: Diagnosis not present

## 2022-03-15 DIAGNOSIS — G9340 Encephalopathy, unspecified: Secondary | ICD-10-CM | POA: Diagnosis not present

## 2022-03-18 DIAGNOSIS — Z9181 History of falling: Secondary | ICD-10-CM | POA: Diagnosis not present

## 2022-03-18 DIAGNOSIS — Z86718 Personal history of other venous thrombosis and embolism: Secondary | ICD-10-CM | POA: Diagnosis not present

## 2022-03-18 DIAGNOSIS — N2889 Other specified disorders of kidney and ureter: Secondary | ICD-10-CM | POA: Diagnosis not present

## 2022-03-18 DIAGNOSIS — N1832 Chronic kidney disease, stage 3b: Secondary | ICD-10-CM | POA: Diagnosis not present

## 2022-03-18 DIAGNOSIS — G9340 Encephalopathy, unspecified: Secondary | ICD-10-CM | POA: Diagnosis not present

## 2022-03-18 DIAGNOSIS — E039 Hypothyroidism, unspecified: Secondary | ICD-10-CM | POA: Diagnosis not present

## 2022-03-18 DIAGNOSIS — M1990 Unspecified osteoarthritis, unspecified site: Secondary | ICD-10-CM | POA: Diagnosis not present

## 2022-03-18 DIAGNOSIS — R7301 Impaired fasting glucose: Secondary | ICD-10-CM | POA: Diagnosis not present

## 2022-03-18 DIAGNOSIS — I129 Hypertensive chronic kidney disease with stage 1 through stage 4 chronic kidney disease, or unspecified chronic kidney disease: Secondary | ICD-10-CM | POA: Diagnosis not present

## 2022-03-18 DIAGNOSIS — K5900 Constipation, unspecified: Secondary | ICD-10-CM | POA: Diagnosis not present

## 2022-03-18 DIAGNOSIS — I69354 Hemiplegia and hemiparesis following cerebral infarction affecting left non-dominant side: Secondary | ICD-10-CM | POA: Diagnosis not present

## 2022-03-18 DIAGNOSIS — G4700 Insomnia, unspecified: Secondary | ICD-10-CM | POA: Diagnosis not present

## 2022-03-18 DIAGNOSIS — E782 Mixed hyperlipidemia: Secondary | ICD-10-CM | POA: Diagnosis not present

## 2022-03-18 DIAGNOSIS — I493 Ventricular premature depolarization: Secondary | ICD-10-CM | POA: Diagnosis not present

## 2022-03-18 DIAGNOSIS — Z7902 Long term (current) use of antithrombotics/antiplatelets: Secondary | ICD-10-CM | POA: Diagnosis not present

## 2022-03-18 DIAGNOSIS — M5030 Other cervical disc degeneration, unspecified cervical region: Secondary | ICD-10-CM | POA: Diagnosis not present

## 2022-03-18 DIAGNOSIS — G8929 Other chronic pain: Secondary | ICD-10-CM | POA: Diagnosis not present

## 2022-03-18 DIAGNOSIS — Z85038 Personal history of other malignant neoplasm of large intestine: Secondary | ICD-10-CM | POA: Diagnosis not present

## 2022-03-25 DIAGNOSIS — E039 Hypothyroidism, unspecified: Secondary | ICD-10-CM | POA: Diagnosis not present

## 2022-03-25 DIAGNOSIS — G9340 Encephalopathy, unspecified: Secondary | ICD-10-CM | POA: Diagnosis not present

## 2022-03-25 DIAGNOSIS — I5032 Chronic diastolic (congestive) heart failure: Secondary | ICD-10-CM | POA: Diagnosis not present

## 2022-03-25 DIAGNOSIS — Z7902 Long term (current) use of antithrombotics/antiplatelets: Secondary | ICD-10-CM | POA: Diagnosis not present

## 2022-03-25 DIAGNOSIS — M5030 Other cervical disc degeneration, unspecified cervical region: Secondary | ICD-10-CM | POA: Diagnosis not present

## 2022-03-25 DIAGNOSIS — G4700 Insomnia, unspecified: Secondary | ICD-10-CM | POA: Diagnosis not present

## 2022-03-25 DIAGNOSIS — N2889 Other specified disorders of kidney and ureter: Secondary | ICD-10-CM | POA: Diagnosis not present

## 2022-03-25 DIAGNOSIS — I129 Hypertensive chronic kidney disease with stage 1 through stage 4 chronic kidney disease, or unspecified chronic kidney disease: Secondary | ICD-10-CM | POA: Diagnosis not present

## 2022-03-25 DIAGNOSIS — I493 Ventricular premature depolarization: Secondary | ICD-10-CM | POA: Diagnosis not present

## 2022-03-25 DIAGNOSIS — M1990 Unspecified osteoarthritis, unspecified site: Secondary | ICD-10-CM | POA: Diagnosis not present

## 2022-03-25 DIAGNOSIS — E1122 Type 2 diabetes mellitus with diabetic chronic kidney disease: Secondary | ICD-10-CM | POA: Diagnosis not present

## 2022-03-25 DIAGNOSIS — K5900 Constipation, unspecified: Secondary | ICD-10-CM | POA: Diagnosis not present

## 2022-03-25 DIAGNOSIS — Z9181 History of falling: Secondary | ICD-10-CM | POA: Diagnosis not present

## 2022-03-25 DIAGNOSIS — I69354 Hemiplegia and hemiparesis following cerebral infarction affecting left non-dominant side: Secondary | ICD-10-CM | POA: Diagnosis not present

## 2022-03-25 DIAGNOSIS — E782 Mixed hyperlipidemia: Secondary | ICD-10-CM | POA: Diagnosis not present

## 2022-03-25 DIAGNOSIS — Z86718 Personal history of other venous thrombosis and embolism: Secondary | ICD-10-CM | POA: Diagnosis not present

## 2022-03-25 DIAGNOSIS — N1832 Chronic kidney disease, stage 3b: Secondary | ICD-10-CM | POA: Diagnosis not present

## 2022-03-25 DIAGNOSIS — Z85038 Personal history of other malignant neoplasm of large intestine: Secondary | ICD-10-CM | POA: Diagnosis not present

## 2022-03-25 DIAGNOSIS — N189 Chronic kidney disease, unspecified: Secondary | ICD-10-CM | POA: Diagnosis not present

## 2022-03-25 DIAGNOSIS — E870 Hyperosmolality and hypernatremia: Secondary | ICD-10-CM | POA: Diagnosis not present

## 2022-03-25 DIAGNOSIS — R7301 Impaired fasting glucose: Secondary | ICD-10-CM | POA: Diagnosis not present

## 2022-03-25 DIAGNOSIS — G8929 Other chronic pain: Secondary | ICD-10-CM | POA: Diagnosis not present

## 2022-03-30 DIAGNOSIS — K5900 Constipation, unspecified: Secondary | ICD-10-CM | POA: Diagnosis not present

## 2022-03-30 DIAGNOSIS — G9340 Encephalopathy, unspecified: Secondary | ICD-10-CM | POA: Diagnosis not present

## 2022-03-30 DIAGNOSIS — Z85038 Personal history of other malignant neoplasm of large intestine: Secondary | ICD-10-CM | POA: Diagnosis not present

## 2022-03-30 DIAGNOSIS — R7301 Impaired fasting glucose: Secondary | ICD-10-CM | POA: Diagnosis not present

## 2022-03-30 DIAGNOSIS — N1832 Chronic kidney disease, stage 3b: Secondary | ICD-10-CM | POA: Diagnosis not present

## 2022-03-30 DIAGNOSIS — Z7902 Long term (current) use of antithrombotics/antiplatelets: Secondary | ICD-10-CM | POA: Diagnosis not present

## 2022-03-30 DIAGNOSIS — N2889 Other specified disorders of kidney and ureter: Secondary | ICD-10-CM | POA: Diagnosis not present

## 2022-03-30 DIAGNOSIS — I129 Hypertensive chronic kidney disease with stage 1 through stage 4 chronic kidney disease, or unspecified chronic kidney disease: Secondary | ICD-10-CM | POA: Diagnosis not present

## 2022-03-30 DIAGNOSIS — G8929 Other chronic pain: Secondary | ICD-10-CM | POA: Diagnosis not present

## 2022-03-30 DIAGNOSIS — M5030 Other cervical disc degeneration, unspecified cervical region: Secondary | ICD-10-CM | POA: Diagnosis not present

## 2022-03-30 DIAGNOSIS — Z9181 History of falling: Secondary | ICD-10-CM | POA: Diagnosis not present

## 2022-03-30 DIAGNOSIS — I69354 Hemiplegia and hemiparesis following cerebral infarction affecting left non-dominant side: Secondary | ICD-10-CM | POA: Diagnosis not present

## 2022-03-30 DIAGNOSIS — Z86718 Personal history of other venous thrombosis and embolism: Secondary | ICD-10-CM | POA: Diagnosis not present

## 2022-03-30 DIAGNOSIS — M1990 Unspecified osteoarthritis, unspecified site: Secondary | ICD-10-CM | POA: Diagnosis not present

## 2022-03-30 DIAGNOSIS — G4700 Insomnia, unspecified: Secondary | ICD-10-CM | POA: Diagnosis not present

## 2022-03-30 DIAGNOSIS — E782 Mixed hyperlipidemia: Secondary | ICD-10-CM | POA: Diagnosis not present

## 2022-03-30 DIAGNOSIS — I493 Ventricular premature depolarization: Secondary | ICD-10-CM | POA: Diagnosis not present

## 2022-03-30 DIAGNOSIS — E039 Hypothyroidism, unspecified: Secondary | ICD-10-CM | POA: Diagnosis not present

## 2022-04-05 ENCOUNTER — Encounter: Payer: Self-pay | Admitting: Cardiology

## 2022-04-05 ENCOUNTER — Ambulatory Visit: Payer: Medicare Other | Attending: Cardiology | Admitting: Cardiology

## 2022-04-05 VITALS — BP 155/80 | HR 71 | Ht 64.0 in | Wt 150.0 lb

## 2022-04-05 DIAGNOSIS — E782 Mixed hyperlipidemia: Secondary | ICD-10-CM | POA: Diagnosis not present

## 2022-04-05 DIAGNOSIS — I10 Essential (primary) hypertension: Secondary | ICD-10-CM | POA: Diagnosis not present

## 2022-04-05 DIAGNOSIS — R002 Palpitations: Secondary | ICD-10-CM | POA: Diagnosis not present

## 2022-04-05 MED ORDER — EZETIMIBE 10 MG PO TABS: 10.0000 mg | ORAL_TABLET | Freq: Every day | ORAL | 3 refills | Status: DC

## 2022-04-05 NOTE — Progress Notes (Signed)
Clinical Summary [name] is a 88 y.o.female seen today for follow up of the following medical problems.    1. Palpitations - prior monitor showed PACs, PVCs, short runs of SVT longest 11 seconds  -severe allergy to monitor.    - taking toprol '100mg'$  bid and dilt '30mg'$  bid - ongoing palpitations, increased frequency   2. HTN - compliant with meds   3. Unprovoked Pulmonary embolus 04/26/18 -  followed by intracranial hemorrhage on eliquis which was stopped and she's now on ASA 325 mg daily.   4. Hyperlipidemia - 09/2019 TC 130 HDL 46 TG 107 LDL 65 - 12/2020 TC 122 TG 43 HDL 52 LDL 61 - 07/2021 TC 144 TG 28 HDL 62 LDL 76 - 01/2022 TC 157 TG 85 HDL 64 LDL 77 - she is on crestor '40mg'$  daily.      5. History of seizures - 12/2020 admitted with AMS, thought to be postictal from seizure   6. TIA - changed from ASA to plavix 07/2021       Past Medical History:  Diagnosis Date   Allergy    Arthritis    Colon cancer ([place])    colon ca dx 07/30/09   History of cardiac monitoring 07/2017   "Event monitor demonstrated sinus rhythm with isolated PACs and no arrhythmias"   History of colon cancer 06/2009   found at time of TCS 06/29/09, 1.2cm sessile cecal polyp, no adjuvent therapy needed   HTN (hypertension)    Hx of cardiovascular stress test 07/2017   "No diagnostic ST segment changes to indicate ischemia. Small, moderate intensity, reversible apical to basal inferolateral defect consistent with ischemia. This is a low risk study. Nuclear stress EF: 84%."   Hyperlipidemia    Hypothyroidism    PE (pulmonary thromboembolism) ([place])    Renal disorder    cyst on kidney    Silent micro-hemorrhage of brain ([place]) 11/25/2018   Stroke ([place])    TIA (transient ischemic attack) 11/25/2018   Vertigo      Allergies  Allergen Reactions   Tape Rash    Zio monitor adhesive causes ulcerated and infected skin .Had to see derm     Current Outpatient Medications  Medication  Sig Dispense Refill   acetaminophen (TYLENOL) 500 MG tablet Take 500 mg by mouth every 8 (eight) hours as needed for mild pain or headache.     ascorbic acid (VITAMIN C) 250 MG CHEW Chew 250 mg by mouth daily.     ASPERCREME LIDOCAINE EX Apply 1 application topically daily as needed (for knee pain).      azithromycin (ZITHROMAX) 500 MG tablet Take 1 tablet (500 mg total) by mouth daily. 4 tablet 0   cefdinir (OMNICEF) 300 MG capsule Take 1 capsule (300 mg total) by mouth every 12 (twelve) hours. 8 capsule 0   cholecalciferol (VITAMIN D3) 25 MCG (1000 UT) tablet Take 1,000 Units by mouth daily.      clopidogrel (PLAVIX) 75 MG tablet TAKE ONE TABLET BY MOUTH ONCE DAILY. 90 tablet 1   diltiazem (CARDIZEM) 30 MG tablet Take 1 tablet (30 mg total) by mouth 2 (two) times daily. May take additional 30 mg tablet as needed for palpitations. 180 tablet 3   ezetimibe (ZETIA) 10 MG tablet Take 1 tablet (10 mg total) by mouth daily. 90 tablet 3   gabapentin (NEURONTIN) 300 MG capsule Take 300 mg by mouth 3 (three) times daily.     guaiFENesin (ROBITUSSIN) 100 MG/5ML  liquid Take 15 mLs by mouth every 4 (four) hours as needed for cough or to loosen phlegm. 120 mL 0   hydrALAZINE (APRESOLINE) 50 MG tablet Take 50 mg by mouth in the morning and at bedtime.     levETIRAcetam (KEPPRA) 500 MG tablet Take 250 mg by mouth 2 (two) times daily.     levothyroxine (SYNTHROID) 25 MCG tablet Take 1 tablet (25 mcg total) by mouth daily. 90 tablet 1   Lifitegrast (XIIDRA) 5 % SOLN Apply 1 drop to eye daily as needed (dry eyes).     LINZESS 72 MCG capsule TAKE (1) CAPSULE BY MOUTH DAILY AS NEEDED FOR CONSTIPATION. (Patient taking differently: Take 72 mcg by mouth every other day.) 90 capsule 3   loratadine (CLARITIN) 10 MG tablet Take 10 mg by mouth daily.      meclizine (ANTIVERT) 25 MG tablet Take 1 tablet (25 mg total) by mouth 3 (three) times daily as needed for dizziness. 15 tablet 0   metoprolol succinate (TOPROL-XL) 100  MG 24 hr tablet Take 1 tablet (100 mg total) by mouth in the morning and at bedtime. Take with or immediately following a meal. 180 tablet 3   rosuvastatin (CRESTOR) 40 MG tablet Take 1 tablet (40 mg total) by mouth daily. 90 tablet 3   topiramate (TOPAMAX) 50 MG tablet Take 50 mg by mouth 2 (two) times daily.     traMADol (ULTRAM) 50 MG tablet Take 25 mg by mouth every 8 (eight) hours as needed for moderate pain.     No current facility-administered medications for this visit.     Past Surgical History:  Procedure Laterality Date   ABDOMINAL HYSTERECTOMY     COLON SURGERY  07/2009   right hemicolectomy, no residual colon cancer on path   COLONOSCOPY  07/16/2010   [id number] POLYP-TCS 3 YEARS   COLONOSCOPY N/A 08/02/2013   hyperplastic polyps, surveillance in 2020 if benefits outweight the risks   COLONOSCOPY  06/2009   1.2 cm sessile cecal polyp which had adenocarcinoma arising in a tubular adenoma.   PARTIAL THYMECTOMY     partial thyroidectomy     benign tumors     Allergies  Allergen Reactions   Tape Rash    Zio monitor adhesive causes ulcerated and infected skin .Had to see derm      Family History  Problem Relation Age of Onset   Colon cancer Mother        >[id number]   Arthritis Mother    Cancer Mother    Heart disease Mother    Hyperlipidemia Mother    Hypertension Mother    Heart attack Father    Heart disease Father    Diabetes Maternal Aunt    Hyperlipidemia Daughter    Hypertension Daughter    Liver disease Neg Hx      Social History [name] reports that she has never smoked. She has never used smokeless tobacco. [name] reports no history of alcohol use.   Review of Systems CONSTITUTIONAL: No weight loss, fever, chills, weakness or fatigue.  HEENT: Eyes: No visual loss, blurred vision, double vision or yellow sclerae.No hearing loss, sneezing, congestion, runny nose or sore throat.  SKIN: No rash or itching.  CARDIOVASCULAR:  per hpi RESPIRATORY: No shortness of breath, cough or sputum.  GASTROINTESTINAL: No anorexia, nausea, vomiting or diarrhea. No abdominal pain or blood.  GENITOURINARY: No burning on urination, no polyuria NEUROLOGICAL: No headache, dizziness, syncope, paralysis, ataxia, numbness or tingling in the  extremities. No change in bowel or bladder control.  MUSCULOSKELETAL: No muscle, back pain, joint pain or stiffness.  LYMPHATICS: No enlarged nodes. No history of splenectomy.  PSYCHIATRIC: No history of depression or anxiety.  ENDOCRINOLOGIC: No reports of sweating, cold or heat intolerance. No polyuria or polydipsia.  Marland Kitchen   Physical Examination Today's Vitals   04/05/22 1534  BP: (!) 161/79  Pulse: 71  SpO2: 98%  Weight: 150 lb (68 kg)  Height: '5\' 4"'$  (1.626 m)   Body mass index is 25.75 kg/m.  Gen: resting comfortably, no acute distress HEENT: no scleral icterus, pupils equal round and reactive, no palptable cervical adenopathy,  CV: RRR, no mr/g no jvd Resp: Clear to auscultation bilaterally GI: abdomen is soft, non-tender, non-distended, normal bowel sounds, no hepatosplenomegaly MSK: extremities are warm, no edema.  Skin: warm, no rash Neuro:  no focal deficits Psych: appropriate affect   Diagnostic Studies   07/2021 echo 1. Left ventricular ejection fraction, by estimation, is 65 to 70%. The  left ventricle has normal function. The left ventricle has no regional  wall motion abnormalities. There is mild concentric left ventricular  hypertrophy. Left ventricular diastolic  parameters are consistent with Grade I diastolic dysfunction (impaired  relaxation).   2. Right ventricular systolic function is normal. The right ventricular  size is normal. There is mildly elevated pulmonary artery systolic  pressure. The estimated right ventricular systolic pressure is 32.9 mmHg.   3. The mitral valve is grossly normal. Trivial mitral valve  regurgitation.   4. The aortic valve is  tricuspid. Aortic valve regurgitation is not  visualized. Aortic valve sclerosis is present, with no evidence of aortic  valve stenosis. Aortic valve mean gradient measures 3.0 mmHg.   5. The inferior vena cava is normal in size with greater than 50%  respiratory variability, suggesting right atrial pressure of 3 mmHg.     Assessment and Plan   1. Palpitations - prior monitor showed PAC/PVCs and SVT - ongoing symptoms, will increase dilt to '60mg'$  bid. If symptoms improved could transition to long acting dilt '120mg'$  daily. They will update [place] Friday   2. HTN -above goal, increase diltiazem to '60mg'$  bid as listed above   3. Hyperlipdiemia - given ICA disease and recent TIA would be more aggressive with LDL lowering to goal <70 - LDL remains above goal, add zetia '10mg'$  to crestor '40mg'$ .      Arnoldo Lenis, M.D.

## 2022-04-05 NOTE — Patient Instructions (Addendum)
Medication Instructions:  Your physician has recommended you make the following change in your medication:  - Take Diltiazem 30 mg tablets- 2 tablets in the morning/2 tablets in the evening. Call our office and update [place].   - Start Zetia 10 mg tablets once daily  Labwork: None  Testing/Procedures: None  Follow-Up: Follow up with Dr. Harl Bowie in 6 months.   Any Other Special Instructions Will Be Listed Below (If Applicable).     If you need a refill on your cardiac medications before your next appointment, please call your pharmacy.

## 2022-04-07 DIAGNOSIS — E782 Mixed hyperlipidemia: Secondary | ICD-10-CM | POA: Diagnosis not present

## 2022-04-07 DIAGNOSIS — I69354 Hemiplegia and hemiparesis following cerebral infarction affecting left non-dominant side: Secondary | ICD-10-CM | POA: Diagnosis not present

## 2022-04-07 DIAGNOSIS — Z85038 Personal history of other malignant neoplasm of large intestine: Secondary | ICD-10-CM | POA: Diagnosis not present

## 2022-04-07 DIAGNOSIS — G8929 Other chronic pain: Secondary | ICD-10-CM | POA: Diagnosis not present

## 2022-04-07 DIAGNOSIS — I493 Ventricular premature depolarization: Secondary | ICD-10-CM | POA: Diagnosis not present

## 2022-04-07 DIAGNOSIS — K5900 Constipation, unspecified: Secondary | ICD-10-CM | POA: Diagnosis not present

## 2022-04-07 DIAGNOSIS — G4700 Insomnia, unspecified: Secondary | ICD-10-CM | POA: Diagnosis not present

## 2022-04-07 DIAGNOSIS — N2889 Other specified disorders of kidney and ureter: Secondary | ICD-10-CM | POA: Diagnosis not present

## 2022-04-07 DIAGNOSIS — Z9181 History of falling: Secondary | ICD-10-CM | POA: Diagnosis not present

## 2022-04-07 DIAGNOSIS — R7301 Impaired fasting glucose: Secondary | ICD-10-CM | POA: Diagnosis not present

## 2022-04-07 DIAGNOSIS — G9340 Encephalopathy, unspecified: Secondary | ICD-10-CM | POA: Diagnosis not present

## 2022-04-07 DIAGNOSIS — E039 Hypothyroidism, unspecified: Secondary | ICD-10-CM | POA: Diagnosis not present

## 2022-04-07 DIAGNOSIS — M1990 Unspecified osteoarthritis, unspecified site: Secondary | ICD-10-CM | POA: Diagnosis not present

## 2022-04-07 DIAGNOSIS — I129 Hypertensive chronic kidney disease with stage 1 through stage 4 chronic kidney disease, or unspecified chronic kidney disease: Secondary | ICD-10-CM | POA: Diagnosis not present

## 2022-04-07 DIAGNOSIS — M5030 Other cervical disc degeneration, unspecified cervical region: Secondary | ICD-10-CM | POA: Diagnosis not present

## 2022-04-07 DIAGNOSIS — Z7902 Long term (current) use of antithrombotics/antiplatelets: Secondary | ICD-10-CM | POA: Diagnosis not present

## 2022-04-07 DIAGNOSIS — N1832 Chronic kidney disease, stage 3b: Secondary | ICD-10-CM | POA: Diagnosis not present

## 2022-04-07 DIAGNOSIS — Z86718 Personal history of other venous thrombosis and embolism: Secondary | ICD-10-CM | POA: Diagnosis not present

## 2022-04-08 ENCOUNTER — Encounter: Payer: Self-pay | Admitting: Cardiology

## 2022-04-08 NOTE — Telephone Encounter (Signed)
Over the weekend can she take diltiazem '60mg'$  three times daily (2 of the '30mg'$  tablets tid) and udpate [place] again early next week. We can still consolidate to the long acting form of diltiazem once we know what dose works   Zandra Abts MD

## 2022-04-09 ENCOUNTER — Other Ambulatory Visit: Payer: Self-pay | Admitting: Internal Medicine

## 2022-04-11 ENCOUNTER — Other Ambulatory Visit: Payer: Self-pay | Admitting: Internal Medicine

## 2022-04-13 DIAGNOSIS — M5030 Other cervical disc degeneration, unspecified cervical region: Secondary | ICD-10-CM | POA: Diagnosis not present

## 2022-04-13 DIAGNOSIS — E782 Mixed hyperlipidemia: Secondary | ICD-10-CM | POA: Diagnosis not present

## 2022-04-13 DIAGNOSIS — Z85038 Personal history of other malignant neoplasm of large intestine: Secondary | ICD-10-CM | POA: Diagnosis not present

## 2022-04-13 DIAGNOSIS — I69354 Hemiplegia and hemiparesis following cerebral infarction affecting left non-dominant side: Secondary | ICD-10-CM | POA: Diagnosis not present

## 2022-04-13 DIAGNOSIS — Z9181 History of falling: Secondary | ICD-10-CM | POA: Diagnosis not present

## 2022-04-13 DIAGNOSIS — I129 Hypertensive chronic kidney disease with stage 1 through stage 4 chronic kidney disease, or unspecified chronic kidney disease: Secondary | ICD-10-CM | POA: Diagnosis not present

## 2022-04-13 DIAGNOSIS — N2889 Other specified disorders of kidney and ureter: Secondary | ICD-10-CM | POA: Diagnosis not present

## 2022-04-13 DIAGNOSIS — G4700 Insomnia, unspecified: Secondary | ICD-10-CM | POA: Diagnosis not present

## 2022-04-13 DIAGNOSIS — E039 Hypothyroidism, unspecified: Secondary | ICD-10-CM | POA: Diagnosis not present

## 2022-04-13 DIAGNOSIS — Z7902 Long term (current) use of antithrombotics/antiplatelets: Secondary | ICD-10-CM | POA: Diagnosis not present

## 2022-04-13 DIAGNOSIS — G8929 Other chronic pain: Secondary | ICD-10-CM | POA: Diagnosis not present

## 2022-04-13 DIAGNOSIS — Z86718 Personal history of other venous thrombosis and embolism: Secondary | ICD-10-CM | POA: Diagnosis not present

## 2022-04-13 DIAGNOSIS — M1990 Unspecified osteoarthritis, unspecified site: Secondary | ICD-10-CM | POA: Diagnosis not present

## 2022-04-13 DIAGNOSIS — K5900 Constipation, unspecified: Secondary | ICD-10-CM | POA: Diagnosis not present

## 2022-04-13 DIAGNOSIS — R7301 Impaired fasting glucose: Secondary | ICD-10-CM | POA: Diagnosis not present

## 2022-04-13 DIAGNOSIS — N1832 Chronic kidney disease, stage 3b: Secondary | ICD-10-CM | POA: Diagnosis not present

## 2022-04-13 DIAGNOSIS — G9340 Encephalopathy, unspecified: Secondary | ICD-10-CM | POA: Diagnosis not present

## 2022-04-13 DIAGNOSIS — I493 Ventricular premature depolarization: Secondary | ICD-10-CM | POA: Diagnosis not present

## 2022-04-24 ENCOUNTER — Encounter: Payer: Self-pay | Admitting: Cardiology

## 2022-04-26 ENCOUNTER — Other Ambulatory Visit: Payer: Self-pay

## 2022-04-26 ENCOUNTER — Telehealth: Payer: Self-pay | Admitting: Cardiology

## 2022-04-26 MED ORDER — DILTIAZEM HCL 30 MG PO TABS: 30.0000 mg | ORAL_TABLET | Freq: Three times a day (TID) | ORAL | 3 refills | Status: DC | PRN

## 2022-04-26 MED ORDER — DILTIAZEM HCL ER 120 MG PO TB24: 120.0000 mg | ORAL_TABLET | Freq: Every day | ORAL | 3 refills | Status: DC

## 2022-04-26 NOTE — Telephone Encounter (Signed)
Can change to diltiazem 24 hour acting 180m once daily. ALso can keep the short acting diltiazem 358mcan take every 8 hours as needed.   J Zandra AbtsD

## 2022-04-26 NOTE — Telephone Encounter (Signed)
  Pt c/o medication issue:  1. Name of Medication:   diltiazem (CARDIZEM LA) 120 MG 24 hr tablet    2. How are you currently taking this medication (dosage and times per day)?    Take 1 tablet (120 mg total) by mouth daily.    3. Are you having a reaction (difficulty breathing--STAT)? No   4. What is your medication issue? Beth with [facility], she said this medication is not covered by [employer], the lowest dose they have that covered by insurance is 180 mg or if Dr. Harl Bowie ok to changed the prescription to cardizem CD extended release capsule 120 mg also covered by pt's insurance

## 2022-04-28 NOTE — Telephone Encounter (Signed)
Cardizem cd 1101m daily would be fine to change to  JZandra AbtsMD

## 2022-04-29 MED ORDER — DILTIAZEM HCL ER COATED BEADS 120 MG PO CP24: 120.0000 mg | ORAL_CAPSULE | Freq: Every day | ORAL | 3 refills | Status: DC

## 2022-04-29 NOTE — Telephone Encounter (Signed)
Spoke to [place] at [facility] and verbalized switch in medications. Medication list updated to reflect changes.

## 2022-05-23 ENCOUNTER — Encounter: Payer: Self-pay | Admitting: Neurology

## 2022-05-23 ENCOUNTER — Ambulatory Visit: Payer: Medicare Other | Admitting: Neurology

## 2022-05-23 VITALS — BP 162/91 | HR 65 | Ht 64.0 in | Wt 146.5 lb

## 2022-05-23 DIAGNOSIS — R569 Unspecified convulsions: Secondary | ICD-10-CM | POA: Diagnosis not present

## 2022-05-23 DIAGNOSIS — R42 Dizziness and giddiness: Secondary | ICD-10-CM | POA: Diagnosis not present

## 2022-05-23 DIAGNOSIS — G8929 Other chronic pain: Secondary | ICD-10-CM | POA: Diagnosis not present

## 2022-05-23 DIAGNOSIS — R519 Headache, unspecified: Secondary | ICD-10-CM

## 2022-05-23 NOTE — Progress Notes (Signed)
[facility]  PATIENT: [name] DOB: [birth year]  REQUESTING CLINICIAN: Phillips Odor, MD HISTORY FROM: Patient, daughter and chart review  REASON FOR VISIT: Headaches, seizure disorder TOC from [facility]    HISTORICAL  CHIEF COMPLAINT:  Chief Complaint  Patient presents with   New Patient (Initial Visit)    Rm 12. Accompanied by daughter. NP/Paper/[facility]/Kofi Doonquah MD/TOC for headaches, hx stroke, hx seizures.    HISTORY OF PRESENT ILLNESS:  This is a 88 year old woman with multiple medical conditions including hypertension, hyperlipidemia, hypothyroidism, TIA, vertigo, seizure disorder chronic headaches, who is presenting to establish care.  Patient was previously managed at [facility] neurology by Dr. Merlene Laughter.  History today is mainly obtained via chart review and from patient's daughter.  She reports about 5 years ago she had an event of altered mental status, confusion.  Daughter reports calling patient throughout the day and unable to reach her and when she went to her house she was just keeps on repeating "what's wrong"  "what's wrong"  "what's wrong".  She was taken to the hospital, initial workup was negative.  They follow-up with Dr. Merlene Laughter who treated the event as seizure and started the patient on Keppra 250 mg twice daily since then he had she has not had any additional event.  She did also have a routine EEG which was normal.  Patient is also complaining of chronic headache.  Her headaches are mainly left-sided. She will have moderate headache at least once every other week that she might take Tylenol and she will have severe headaches at least once every 4 to 6 weeks, these headaches can last up to 3 days and she takes tramadol 12.5 mg She described her headaches are strong dull pain, denies any other associating features as sensitivity to light or noise or nausea and vomiting.  Patient also have chronic vertigo and  some other chronic headache symptoms.   OTHER MEDICAL CONDITIONS: Chronic headaches, seizures, hypertension, hyperlipidemia, hypothyroidism, TIA, vertigo   REVIEW OF SYSTEMS: Full 14 system review of systems performed and negative with exception of: As noted in the HPI   ALLERGIES: Allergies  Allergen Reactions   Tape Rash    Zio monitor adhesive causes ulcerated and infected skin .Had to see derm    HOME MEDICATIONS: Outpatient Medications Prior to Visit  Medication Sig Dispense Refill   acetaminophen (TYLENOL) 500 MG tablet Take 500 mg by mouth every 8 (eight) hours as needed for mild pain or headache.     ascorbic acid (VITAMIN C) 250 MG CHEW Chew 250 mg by mouth daily.     ASPERCREME LIDOCAINE EX Apply 1 application topically daily as needed (for knee pain).      cholecalciferol (VITAMIN D3) 25 MCG (1000 UT) tablet Take 1,000 Units by mouth daily.      clopidogrel (PLAVIX) 75 MG tablet TAKE ONE TABLET BY MOUTH ONCE DAILY. 90 tablet 1   diltiazem (CARDIZEM CD) 120 MG 24 hr capsule Take 1 capsule (120 mg total) by mouth daily. 90 capsule 3   ezetimibe (ZETIA) 10 MG tablet Take 1 tablet (10 mg total) by mouth daily. 90 tablet 3   gabapentin (NEURONTIN) 300 MG capsule Take 300 mg by mouth 3 (three) times daily.     levETIRAcetam (KEPPRA) 500 MG tablet Take 250 mg by mouth 2 (two) times daily.     levothyroxine (SYNTHROID) 25 MCG tablet Take 1 tablet (25 mcg total) by mouth daily. 90 tablet 1   Lifitegrast (  XIIDRA) 5 % SOLN Apply 1 drop to eye daily as needed (dry eyes).     LINZESS 72 MCG capsule TAKE (1) CAPSULE BY MOUTH DAILY AS NEEDED FOR CONSTIPATION. 30 capsule 0   loratadine (CLARITIN) 10 MG tablet Take 10 mg by mouth daily.      meclizine (ANTIVERT) 25 MG tablet Take 1 tablet (25 mg total) by mouth 3 (three) times daily as needed for dizziness. 15 tablet 0   metoprolol succinate (TOPROL-XL) 100 MG 24 hr tablet Take 1 tablet (100 mg total) by mouth in the morning and at bedtime.  Take with or immediately following a meal. 180 tablet 3   rosuvastatin (CRESTOR) 40 MG tablet Take 1 tablet (40 mg total) by mouth daily. 90 tablet 3   topiramate (TOPAMAX) 50 MG tablet Take 50 mg by mouth 2 (two) times daily.     traMADol (ULTRAM) 50 MG tablet Take 25 mg by mouth every 8 (eight) hours as needed for moderate pain.     azithromycin (ZITHROMAX) 500 MG tablet Take 1 tablet (500 mg total) by mouth daily. 4 tablet 0   cefdinir (OMNICEF) 300 MG capsule Take 1 capsule (300 mg total) by mouth every 12 (twelve) hours. 8 capsule 0   diltiazem (CARDIZEM) 30 MG tablet Take 1 tablet (30 mg total) by mouth every 8 (eight) hours as needed. May take additional 30 mg tablet as needed for palpitations. 90 tablet 3   guaiFENesin (ROBITUSSIN) 100 MG/5ML liquid Take 15 mLs by mouth every 4 (four) hours as needed for cough or to loosen phlegm. 120 mL 0   hydrALAZINE (APRESOLINE) 50 MG tablet Take 50 mg by mouth in the morning and at bedtime.     No facility-administered medications prior to visit.    PAST MEDICAL HISTORY: Past Medical History:  Diagnosis Date   Allergy    Arthritis    Colon cancer ([place])    colon ca dx 07/30/09   History of cardiac monitoring 07/2017   "Event monitor demonstrated sinus rhythm with isolated PACs and no arrhythmias"   History of colon cancer 06/2009   found at time of TCS 06/29/09, 1.2cm sessile cecal polyp, no adjuvent therapy needed   HTN (hypertension)    Hx of cardiovascular stress test 07/2017   "No diagnostic ST segment changes to indicate ischemia. Small, moderate intensity, reversible apical to basal inferolateral defect consistent with ischemia. This is a low risk study. Nuclear stress EF: 84%."   Hyperlipidemia    Hypothyroidism    PE (pulmonary thromboembolism) ([place])    Renal disorder    cyst on kidney    Silent micro-hemorrhage of brain ([place]) 11/25/2018   Stroke ([place])    TIA (transient ischemic attack) 11/25/2018   Vertigo     PAST SURGICAL  HISTORY: Past Surgical History:  Procedure Laterality Date   ABDOMINAL HYSTERECTOMY     COLON SURGERY  07/2009   right hemicolectomy, no residual colon cancer on path   COLONOSCOPY  07/16/2010   [id number] POLYP-TCS 3 YEARS   COLONOSCOPY N/A 08/02/2013   hyperplastic polyps, surveillance in 2020 if benefits outweight the risks   COLONOSCOPY  06/2009   1.2 cm sessile cecal polyp which had adenocarcinoma arising in a tubular adenoma.   PARTIAL THYMECTOMY     partial thyroidectomy     benign tumors    FAMILY HISTORY: Family History  Problem Relation Age of Onset   Colon cancer Mother        >age60  Arthritis Mother    Cancer Mother    Heart disease Mother    Hyperlipidemia Mother    Hypertension Mother    Heart attack Father    Heart disease Father    Diabetes Maternal Aunt    Hyperlipidemia Daughter    Hypertension Daughter    Liver disease Neg Hx     SOCIAL HISTORY: Social History   Socioeconomic History   Marital status: Widowed    Spouse name: Not on file   Number of children: 2   Years of education: Not on file   Highest education level: Not on file  Occupational History   Occupation: Psychologist, 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   Occupation: retired from [employer]  Tobacco Use   Smoking status: Never   Smokeless tobacco: Never  Vaping Use   Vaping Use: Never used  Substance and Sexual Activity   Alcohol use: No    Alcohol/week: 0.0 standard drinks of alcohol   Drug use: No   Sexual activity: Not Currently  Other Topics Concern   Not on file  Social History Narrative   Not on file   Social Determinants of Health   Financial Resource Strain: [place]  (11/13/2020)   Overall Financial Resource Strain (CARDIA)    Difficulty of Paying Living Expenses: Not very hard  Food Insecurity: No Food Insecurity (02/13/2022)   Hunger Vital Sign    Worried About Running Out of Food in the Last Year: Never true    Ran Out of Food in the Last Year: Never true   Transportation Needs: No Transportation Needs (02/17/2022)   PRAPARE - Hydrologist (Medical): No    Lack of Transportation (Non-Medical): No  Physical Activity: Insufficiently Active (11/22/2021)   Exercise Vital Sign    Days of Exercise per Week: 4 days    Minutes of Exercise per Session: 10 min  Stress: No Stress Concern Present (11/13/2020)   [place]    Feeling of Stress : Only a little  Social Connections: Moderately Isolated (11/13/2020)   Social Connection and Isolation Panel [NHANES]    Frequency of Communication with Friends and Family: More than three times a week    Frequency of Social Gatherings with Friends and Family: More than three times a week    Attends Religious Services: More than 4 times per year    Active Member of [employer] or Organizations: No    Attends Archivist Meetings: Never    Marital Status: Widowed  Intimate Partner Violence: Not At Risk (02/13/2022)   Humiliation, Afraid, Rape, and Kick questionnaire    Fear of Current or Ex-Partner: No    Emotionally Abused: No    Physically Abused: No    Sexually Abused: No    PHYSICAL EXAM  GENERAL EXAM/CONSTITUTIONAL: Vitals:  Vitals:   05/23/22 1526  BP: (!) 162/91  Pulse: 65  Weight: 146 lb 8 oz (66.5 kg)  Height: '5\' 4"'$  (1.626 m)   Body mass index is 25.15 kg/m. Wt Readings from Last 3 Encounters:  05/23/22 146 lb 8 oz (66.5 kg)  04/05/22 150 lb (68 kg)  03/01/22 152 lb (68.9 kg)   Patient is in no distress; well developed, nourished and groomed; neck is supple  EYES: Visual fields full to confrontation, Extraocular movements intacts,   MUSCULOSKELETAL: Gait, strength, tone, movements noted in Neurologic exam below  NEUROLOGIC: MENTAL STATUS:      No  data to display         awake, alert, oriented to person, place but not time, just know we are in March  Difficulty recalling recent  history  Difficulty with calculated in number of quarters in $2. Difficulty with stating the day of the week forward Registration 3/3 and recall 1/3 language fluent, comprehension intact, naming intact   CRANIAL NERVE:  2nd, 3rd, 4th, 6th - Visual fields full to confrontation, extraocular muscles intact, no nystagmus 5th - facial sensation symmetric 7th - facial strength symmetric 8th - hearing intact 9th - palate elevates symmetrically, uvula midline 11th - shoulder shrug symmetric 12th - tongue protrusion midline  MOTOR:  normal bulk and tone, full strength in the BUE, BLE  SENSORY:  normal and symmetric to light touch  COORDINATION:  finger-nose-finger, fine finger movements normal  REFLEXES:  deep tendon reflexes present and symmetric  GAIT/STATION:  Ambulates with a cane      DIAGNOSTIC DATA (LABS, IMAGING, TESTING) - I reviewed patient records, labs, notes, testing and imaging myself where available.  Lab Results  Component Value Date   WBC 10.3 02/14/2022   HGB 13.5 02/14/2022   HCT 41.8 02/14/2022   MCV 97.7 02/14/2022   PLT 213 02/14/2022      Component Value Date/Time   NA 145 02/15/2022 0536   NA 145 (H) 08/06/2021 1512   NA 144 04/16/2015 0812   K 3.8 02/15/2022 0536   K 3.9 04/16/2015 0812   CL 119 (H) 02/15/2022 0536   CL 105 04/02/2012 0914   CO2 20 (L) 02/15/2022 0536   CO2 29 04/16/2015 0812   GLUCOSE 103 (H) 02/15/2022 0536   GLUCOSE 102 04/16/2015 0812   GLUCOSE 105 (H) 04/02/2012 0914   BUN 14 02/15/2022 0536   BUN 21 08/06/2021 1512   BUN 12.4 04/16/2015 0812   CREATININE 1.11 (H) 02/15/2022 0536   CREATININE 1.52 (H) 09/20/2019 1605   CREATININE 1.2 (H) 04/16/2015 0812   CALCIUM 8.5 (L) 02/15/2022 0536   CALCIUM 10.0 04/16/2015 0812   PROT 8.2 (H) 02/12/2022 0910   PROT 6.9 12/08/2020 0956   PROT 8.2 04/16/2015 0812   ALBUMIN 4.0 02/12/2022 0910   ALBUMIN 4.0 12/08/2020 0956   ALBUMIN 4.0 04/16/2015 0812   AST 17 02/12/2022  0910   AST 18 04/16/2015 0812   ALT 14 02/12/2022 0910   ALT 19 04/16/2015 0812   ALKPHOS 83 02/12/2022 0910   ALKPHOS 103 04/16/2015 0812   BILITOT 0.5 02/12/2022 0910   BILITOT 0.6 12/08/2020 0956   BILITOT 0.78 04/16/2015 0812   GFRNONAA 49 (L) 02/15/2022 0536   GFRNONAA 36 (L) 11/04/2016 0854   GFRAA 41 (L) 12/03/2019 1214   GFRAA 42 (L) 11/04/2016 0854   Lab Results  Component Value Date   CHOL 157 02/07/2022   HDL 64 02/07/2022   LDLCALC 77 02/07/2022   TRIG 85 02/07/2022   CHOLHDL 2.5 02/07/2022   Lab Results  Component Value Date   HGBA1C 6.0 (H) 02/07/2022   No results found for: "VITAMINB12" Lab Results  Component Value Date   TSH 0.287 (L) 02/07/2022      ASSESSMENT AND PLAN  88 y.o. year old female with chronic headaches, seizure disorder, hypertension, hyperlipidemia, hypothyroidism, TIA, vertigo here to establish care.  In terms of the seizures they are well maintained on Depakote Keppra 250 mg twice daily.  Will continue current medication.  In terms of the chronic headaches.  Patient reports over-the-counter Tylenol and  low-dose tramadol, 12.5 mg helps.  For now again we will continue the current dose.   Today on exam patient was noted to have difficulty answering orientation question, difficulty with attention and concentration and I suspect that she has mild cognitive impairment.  At next visit we will concentrate more on the memory evaluation.  This was explained to the daughter and patient and they are comfortable with plan.  I will see him in 6 months for follow-up or sooner if worse.   1. Seizures ([place])   2. Chronic nonintractable headache, unspecified headache type   3. Vertigo      Patient Instructions  Continue current medications  Follow up in 6 months or sooner if worse   No orders of the defined types were placed in this encounter.   No orders of the defined types were placed in this encounter.   Return in about 6 months (around  11/23/2022).    Alric Ran, MD 05/23/2022, 5:08 PM  [facility] [address] [place], [place] [address] [phone]

## 2022-05-23 NOTE — Patient Instructions (Signed)
Continue current medications  °Follow up in 6 months or sooner if worse  °

## 2022-06-09 DIAGNOSIS — I129 Hypertensive chronic kidney disease with stage 1 through stage 4 chronic kidney disease, or unspecified chronic kidney disease: Secondary | ICD-10-CM | POA: Diagnosis not present

## 2022-06-09 DIAGNOSIS — I5032 Chronic diastolic (congestive) heart failure: Secondary | ICD-10-CM | POA: Diagnosis not present

## 2022-06-09 DIAGNOSIS — N189 Chronic kidney disease, unspecified: Secondary | ICD-10-CM | POA: Diagnosis not present

## 2022-06-09 DIAGNOSIS — E870 Hyperosmolality and hypernatremia: Secondary | ICD-10-CM | POA: Diagnosis not present

## 2022-06-09 DIAGNOSIS — E1122 Type 2 diabetes mellitus with diabetic chronic kidney disease: Secondary | ICD-10-CM | POA: Diagnosis not present

## 2022-06-14 DIAGNOSIS — E870 Hyperosmolality and hypernatremia: Secondary | ICD-10-CM | POA: Diagnosis not present

## 2022-06-14 DIAGNOSIS — I129 Hypertensive chronic kidney disease with stage 1 through stage 4 chronic kidney disease, or unspecified chronic kidney disease: Secondary | ICD-10-CM | POA: Diagnosis not present

## 2022-06-14 DIAGNOSIS — N189 Chronic kidney disease, unspecified: Secondary | ICD-10-CM | POA: Diagnosis not present

## 2022-06-14 DIAGNOSIS — E1122 Type 2 diabetes mellitus with diabetic chronic kidney disease: Secondary | ICD-10-CM | POA: Diagnosis not present

## 2022-06-14 DIAGNOSIS — I5032 Chronic diastolic (congestive) heart failure: Secondary | ICD-10-CM | POA: Diagnosis not present

## 2022-06-25 ENCOUNTER — Other Ambulatory Visit: Payer: Self-pay | Admitting: Cardiology

## 2022-07-19 ENCOUNTER — Telehealth: Payer: Self-pay | Admitting: Neurology

## 2022-07-19 NOTE — Telephone Encounter (Signed)
Sent mychart msg informing pt of appt change due to provider schedule change 

## 2022-07-21 ENCOUNTER — Encounter: Payer: Self-pay | Admitting: Internal Medicine

## 2022-07-21 ENCOUNTER — Ambulatory Visit (INDEPENDENT_AMBULATORY_CARE_PROVIDER_SITE_OTHER): Payer: Medicare Other | Admitting: Internal Medicine

## 2022-07-21 VITALS — BP 132/66 | HR 66 | Ht 64.0 in | Wt 150.8 lb

## 2022-07-21 DIAGNOSIS — G8194 Hemiplegia, unspecified affecting left nondominant side: Secondary | ICD-10-CM

## 2022-07-21 DIAGNOSIS — R42 Dizziness and giddiness: Secondary | ICD-10-CM | POA: Diagnosis not present

## 2022-07-21 DIAGNOSIS — I10 Essential (primary) hypertension: Secondary | ICD-10-CM | POA: Diagnosis not present

## 2022-07-21 DIAGNOSIS — E039 Hypothyroidism, unspecified: Secondary | ICD-10-CM | POA: Diagnosis not present

## 2022-07-21 DIAGNOSIS — N1832 Chronic kidney disease, stage 3b: Secondary | ICD-10-CM | POA: Diagnosis not present

## 2022-07-21 DIAGNOSIS — R7303 Prediabetes: Secondary | ICD-10-CM | POA: Diagnosis not present

## 2022-07-21 DIAGNOSIS — G40209 Localization-related (focal) (partial) symptomatic epilepsy and epileptic syndromes with complex partial seizures, not intractable, without status epilepticus: Secondary | ICD-10-CM

## 2022-07-21 DIAGNOSIS — R4181 Age-related cognitive decline: Secondary | ICD-10-CM

## 2022-07-21 NOTE — Assessment & Plan Note (Signed)
MMSE-28/30 Episode of confusion after hospitalization likely due to adjustment disorder versus delirium due to change in the surroundings - now improved

## 2022-07-21 NOTE — Progress Notes (Signed)
Established Patient Office Visit  Subjective:  Patient ID: [name], female    DOB: April 24, 1934  Age: 88 y.o. MRN: [id number]  CC:  Chief Complaint  Patient presents with   Hypertension    Follow up. Patient states she gets dizzy when standing up to quick.    HPI [name] is a 88 y.o. female with past medical history of HTN, prediabetes, CVA with residual left sides numbness and weakness, ?partial seizure, hypothyroidism, colon ca. s/p right hemicolectomy, chronic constipation, PE and CKD stage 3 who presents for f/u of her chronic medical conditions.  HTN: BP is well-controlled. Takes medications regularly. Patient denies headache, chest pain, dyspnea or palpitations.  She has dizziness upon sudden change in position, especially while standing up.  Her leg swelling has improved with leg elevation and compression stockings.  She still has mild ankle swelling bilaterally.  She follows up with Dr Wolfgang Phoenix for CKD stage 3.  She takes levothyroxine for hypothyroidism.  She denies any recent change in weight or appetite.  Her daughter reports that she was having confusion episodes after her pneumonia related hospitalization in 12/23.  Her memory and concentration have been getting better slowly.  She had neurology evaluation for her seizure history in 03/24 and was told that she would have formal memory evaluation in the next visit.  Her MMSE was 28/30 today.  Past Medical History:  Diagnosis Date   Allergy    Arthritis    Colon cancer (HCC)    colon ca dx 07/30/09   History of cardiac monitoring 07/2017   "Event monitor demonstrated sinus rhythm with isolated PACs and no arrhythmias"   History of colon cancer 06/2009   found at time of TCS 06/29/09, 1.2cm sessile cecal polyp, no adjuvent therapy needed   HTN (hypertension)    Hx of cardiovascular stress test 07/2017   "No diagnostic ST segment changes to indicate ischemia. Small, moderate intensity,  reversible apical to basal inferolateral defect consistent with ischemia. This is a low risk study. Nuclear stress EF: 84%."   Hyperlipidemia    Hypothyroidism    PE (pulmonary thromboembolism) (HCC)    Renal disorder    cyst on kidney    Silent micro-hemorrhage of brain (HCC) 11/25/2018   Stroke [facility])    TIA (transient ischemic attack) 11/25/2018   Vertigo     Past Surgical History:  Procedure Laterality Date   ABDOMINAL HYSTERECTOMY     COLON SURGERY  07/2009   right hemicolectomy, no residual colon cancer on path   COLONOSCOPY  07/16/2010   WUJ:[id number] POLYP-TCS 3 YEARS   COLONOSCOPY N/A 08/02/2013   hyperplastic polyps, surveillance in 2020 if benefits outweight the risks   COLONOSCOPY  06/2009   1.2 cm sessile cecal polyp which had adenocarcinoma arising in a tubular adenoma.   PARTIAL THYMECTOMY     partial thyroidectomy     benign tumors    Family History  Problem Relation Age of Onset   Colon cancer Mother        >age79   Arthritis Mother    Cancer Mother    Heart disease Mother    Hyperlipidemia Mother    Hypertension Mother    Heart attack Father    Heart disease Father    Diabetes Maternal Aunt    Hyperlipidemia Daughter    Hypertension Daughter    Liver disease Neg Hx     Social History   Socioeconomic History   Marital status: Widowed  Spouse name: Not on file   Number of children: 2   Years of education: Not on file   Highest education level: Not on file  Occupational History   Occupation: Agricultural consultant at [employer]    Employer: RETIRED   Occupation: retired from Designer, fashion/clothing  Tobacco Use   Smoking status: Never   Smokeless tobacco: Never  Vaping Use   Vaping Use: Never used  Substance and Sexual Activity   Alcohol use: No    Alcohol/week: 0.0 standard drinks of alcohol   Drug use: No   Sexual activity: Not Currently  Other Topics Concern   Not on file  Social History Narrative   Not on file   Social Determinants of Health   Financial  Resource Strain: Low Risk  (11/13/2020)   Overall Financial Resource Strain (CARDIA)    Difficulty of Paying Living Expenses: Not very hard  Food Insecurity: No Food Insecurity (02/13/2022)   Hunger Vital Sign    Worried About Running Out of Food in the Last Year: Never true    Ran Out of Food in the Last Year: Never true  Transportation Needs: No Transportation Needs (02/17/2022)   PRAPARE - Administrator, Civil Service (Medical): No    Lack of Transportation (Non-Medical): No  Physical Activity: Insufficiently Active (11/22/2021)   Exercise Vital Sign    Days of Exercise per Week: 4 days    Minutes of Exercise per Session: 10 min  Stress: No Stress Concern Present (11/13/2020)   [employer] of Occupational Health - Occupational Stress Questionnaire    Feeling of Stress : Only a little  Social Connections: Moderately Isolated (11/13/2020)   Social Connection and Isolation Panel [NHANES]    Frequency of Communication with Friends and Family: More than three times a week    Frequency of Social Gatherings with Friends and Family: More than three times a week    Attends Religious Services: More than 4 times per year    Active Member of [employer] or Organizations: No    Attends Banker Meetings: Never    Marital Status: Widowed  Intimate Partner Violence: Not At Risk (02/13/2022)   Humiliation, Afraid, Rape, and Kick questionnaire    Fear of Current or Ex-Partner: No    Emotionally Abused: No    Physically Abused: No    Sexually Abused: No    Outpatient Medications Prior to Visit  Medication Sig Dispense Refill   acetaminophen (TYLENOL) 500 MG tablet Take 500 mg by mouth every 8 (eight) hours as needed for mild pain or headache.     ascorbic acid (VITAMIN C) 250 MG CHEW Chew 250 mg by mouth daily.     ASPERCREME LIDOCAINE EX Apply 1 application topically daily as needed (for knee pain).      cholecalciferol (VITAMIN D3) 25 MCG (1000 UT) tablet Take 1,000 Units by  mouth daily.      clopidogrel (PLAVIX) 75 MG tablet TAKE ONE TABLET BY MOUTH ONCE DAILY. 90 tablet 1   diltiazem (CARDIZEM CD) 120 MG 24 hr capsule Take 1 capsule (120 mg total) by mouth daily. 90 capsule 3   ezetimibe (ZETIA) 10 MG tablet Take 1 tablet (10 mg total) by mouth daily. 90 tablet 3   gabapentin (NEURONTIN) 300 MG capsule Take 300 mg by mouth 3 (three) times daily.     levETIRAcetam (KEPPRA) 500 MG tablet Take 250 mg by mouth 2 (two) times daily.     levothyroxine (SYNTHROID) 25 MCG tablet Take  1 tablet (25 mcg total) by mouth daily. 90 tablet 1   Lifitegrast (XIIDRA) 5 % SOLN Apply 1 drop to eye daily as needed (dry eyes).     LINZESS 72 MCG capsule TAKE (1) CAPSULE BY MOUTH DAILY AS NEEDED FOR CONSTIPATION. 30 capsule 0   loratadine (CLARITIN) 10 MG tablet Take 10 mg by mouth daily.      meclizine (ANTIVERT) 25 MG tablet Take 1 tablet (25 mg total) by mouth 3 (three) times daily as needed for dizziness. 15 tablet 0   metoprolol succinate (TOPROL-XL) 100 MG 24 hr tablet TAKE 1 TABLET BY MOUTH INTHE MORNING AND AT BEDTIME. TAKE WITH OR IMMEDIATELY FOLLOWING A MEAL. 180 tablet 1   rosuvastatin (CRESTOR) 40 MG tablet Take 1 tablet (40 mg total) by mouth daily. 90 tablet 3   topiramate (TOPAMAX) 50 MG tablet Take 50 mg by mouth 2 (two) times daily.     traMADol (ULTRAM) 50 MG tablet Take 25 mg by mouth every 8 (eight) hours as needed for moderate pain.     No facility-administered medications prior to visit.    Allergies  Allergen Reactions   Tape Rash    Zio monitor adhesive causes ulcerated and infected skin .Had to see derm    ROS Review of Systems  Constitutional:  Negative for chills and fever.  HENT:  Negative for congestion, sinus pressure, sinus pain and sore throat.   Eyes:  Negative for pain and discharge.  Respiratory:  Negative for cough and shortness of breath.   Cardiovascular:  Positive for leg swelling. Negative for chest pain and palpitations.   Gastrointestinal:  Negative for abdominal pain, constipation, diarrhea, nausea and vomiting.  Endocrine: Negative for polydipsia and polyuria.  Genitourinary:  Negative for dysuria and hematuria.  Musculoskeletal:  Positive for arthralgias. Negative for neck pain and neck stiffness.  Skin:  Negative for rash.  Neurological:  Positive for dizziness. Negative for weakness.  Psychiatric/Behavioral:  Negative for agitation and behavioral problems.       Objective:    Physical Exam Vitals reviewed.  Constitutional:      General: She is not in acute distress.    Appearance: She is not diaphoretic.  HENT:     Head: Normocephalic and atraumatic.     Nose: Nose normal.     Mouth/Throat:     Mouth: Mucous membranes are moist.  Eyes:     General: No scleral icterus.    Extraocular Movements: Extraocular movements intact.  Cardiovascular:     Rate and Rhythm: Normal rate and regular rhythm.     Pulses: Normal pulses.     Heart sounds: Normal heart sounds. No murmur heard. Pulmonary:     Breath sounds: Normal breath sounds. No wheezing or rales.  Abdominal:     Palpations: Abdomen is soft.     Tenderness: There is no abdominal tenderness.  Musculoskeletal:     Cervical back: Neck supple. No tenderness.     Right lower leg: No edema.     Left lower leg: No edema.  Skin:    General: Skin is warm.     Findings: No rash.  Neurological:     General: No focal deficit present.     Mental Status: She is alert and oriented to person, place, and time.     Sensory: Sensory deficit (Left UE) present.     Motor: Weakness (Left UE and LE - muscle strength 4/5) present.  Psychiatric:  Mood and Affect: Mood normal.        Behavior: Behavior normal.     BP 132/66 (BP Location: Right Arm, Cuff Size: Normal)   Pulse 66   Ht 5\' 4"  (1.626 m)   Wt 150 lb 12.8 oz (68.4 kg)   SpO2 94%   BMI 25.88 kg/m  Wt Readings from Last 3 Encounters:  07/21/22 150 lb 12.8 oz (68.4 kg)  05/23/22  146 lb 8 oz (66.5 kg)  04/05/22 150 lb (68 kg)    Lab Results  Component Value Date   TSH 0.287 (L) 02/07/2022   Lab Results  Component Value Date   WBC 10.3 02/14/2022   HGB 13.5 02/14/2022   HCT 41.8 02/14/2022   MCV 97.7 02/14/2022   PLT 213 02/14/2022   Lab Results  Component Value Date   NA 145 02/15/2022   K 3.8 02/15/2022   CHLORIDE 105 04/16/2015   CO2 20 (L) 02/15/2022   GLUCOSE 103 (H) 02/15/2022   BUN 14 02/15/2022   CREATININE 1.11 (H) 02/15/2022   BILITOT 0.5 02/12/2022   ALKPHOS 83 02/12/2022   AST 17 02/12/2022   ALT 14 02/12/2022   PROT 8.2 (H) 02/12/2022   ALBUMIN 4.0 02/12/2022   CALCIUM 8.5 (L) 02/15/2022   ANIONGAP 6 02/15/2022   EGFR 35 (L) 08/06/2021   Lab Results  Component Value Date   CHOL 157 02/07/2022   Lab Results  Component Value Date   HDL 64 02/07/2022   Lab Results  Component Value Date   LDLCALC 77 02/07/2022   Lab Results  Component Value Date   TRIG 85 02/07/2022   Lab Results  Component Value Date   CHOLHDL 2.5 02/07/2022   Lab Results  Component Value Date   HGBA1C 6.0 (H) 02/07/2022      Assessment & Plan:   Problem List Items Addressed This Visit       Cardiovascular and Mediastinum   Essential hypertension - Primary    BP Readings from Last 1 Encounters:  07/21/22 132/66  Well-controlled with Metoprolol and Cardizem Counseled for compliance with the medications Advised DASH diet and moderate exercise/walking as tolerated        Endocrine   Hypothyroidism    Lab Results  Component Value Date   TSH 0.287 (L) 02/07/2022  On Levothyroxine 25 mcg QD No change in appetite, weight, tremors or palpitations Check TSH and free T4      Relevant Orders   TSH + free T4     Nervous and Auditory   Left hemiparesis (HCC)    H/o multiple TIAs On Plavix and statin F/u with Neurology        Genitourinary   Chronic kidney disease, stage 3b (HCC)    Last CMP reviewed, CKD stage 3b, stable GFR in  30s F/u with Nephrology Avoid nephrotoxic agents No proteinuria currently according to Nephrology chart        Other   Complex partial seizure (HCC)    Denies any recent seizure-like activity Followed by [facility] neurology On Keppra and Topamax      Prediabetes    Lab Results  Component Value Date   HGBA1C 6.0 (H) 02/07/2022  Diet controlled      Relevant Orders   Hemoglobin A1c   Dizziness    Likely due to postural/orthostatic hypotension although her orthostatic vitals were WNL today Needs to maintain adequate hydration and avoid sudden positional changes      Age-related cognitive decline  MMSE-28/30 Episode of confusion after hospitalization likely due to adjustment disorder versus delirium due to change in the surroundings - now improved       No orders of the defined types were placed in this encounter.   Follow-up: Return in about 6 months (around 01/21/2023) for Annual physical.    Anabel Halon, MD

## 2022-07-21 NOTE — Assessment & Plan Note (Signed)
Lab Results  Component Value Date   HGBA1C 6.0 (H) 02/07/2022   Diet controlled

## 2022-07-21 NOTE — Assessment & Plan Note (Signed)
Denies any recent seizure-like activity Followed by [facility] neurology On Keppra and Topamax

## 2022-07-21 NOTE — Assessment & Plan Note (Signed)
Likely due to postural/orthostatic hypotension although her orthostatic vitals were WNL today Needs to maintain adequate hydration and avoid sudden positional changes

## 2022-07-21 NOTE — Assessment & Plan Note (Signed)
Last CMP reviewed, CKD stage 3b, stable GFR in 30s F/u with Nephrology Avoid nephrotoxic agents No proteinuria currently according to Nephrology chart

## 2022-07-21 NOTE — Assessment & Plan Note (Signed)
H/o multiple TIAs On Plavix and statin F/u with Neurology 

## 2022-07-21 NOTE — Patient Instructions (Signed)
Please continue to take medications as prescribed. ? ?Please continue to follow low salt diet and ambulate as tolerated. ?

## 2022-07-21 NOTE — Assessment & Plan Note (Addendum)
Lab Results  Component Value Date   TSH 0.287 (L) 02/07/2022   On Levothyroxine 25 mcg QD No change in appetite, weight, tremors or palpitations Check TSH and free T4

## 2022-07-21 NOTE — Assessment & Plan Note (Addendum)
BP Readings from Last 1 Encounters:  07/21/22 132/66   Well-controlled with Metoprolol and Cardizem Counseled for compliance with the medications Advised DASH diet and moderate exercise/walking as tolerated

## 2022-07-22 ENCOUNTER — Other Ambulatory Visit: Payer: Self-pay | Admitting: Internal Medicine

## 2022-07-22 LAB — TSH+FREE T4
Free T4: 1.44 ng/dL (ref 0.82–1.77)
TSH: 0.154 u[IU]/mL — ABNORMAL LOW (ref 0.450–4.500)

## 2022-07-22 LAB — HEMOGLOBIN A1C
Est. average glucose Bld gHb Est-mCnc: 128 mg/dL
Hgb A1c MFr Bld: 6.1 % — ABNORMAL HIGH (ref 4.8–5.6)

## 2022-09-10 ENCOUNTER — Other Ambulatory Visit: Payer: Self-pay | Admitting: Cardiology

## 2022-09-13 ENCOUNTER — Ambulatory Visit (INDEPENDENT_AMBULATORY_CARE_PROVIDER_SITE_OTHER): Payer: Medicare Other | Admitting: Internal Medicine

## 2022-09-13 ENCOUNTER — Encounter: Payer: Self-pay | Admitting: Internal Medicine

## 2022-09-13 VITALS — BP 136/76 | HR 64 | Ht 64.0 in | Wt 151.4 lb

## 2022-09-13 DIAGNOSIS — N1832 Chronic kidney disease, stage 3b: Secondary | ICD-10-CM

## 2022-09-13 DIAGNOSIS — M170 Bilateral primary osteoarthritis of knee: Secondary | ICD-10-CM | POA: Diagnosis not present

## 2022-09-13 DIAGNOSIS — I10 Essential (primary) hypertension: Secondary | ICD-10-CM | POA: Diagnosis not present

## 2022-09-13 DIAGNOSIS — M7122 Synovial cyst of popliteal space [Baker], left knee: Secondary | ICD-10-CM | POA: Diagnosis not present

## 2022-09-13 MED ORDER — PREDNISONE 20 MG PO TABS: 20.0000 mg | ORAL_TABLET | Freq: Every day | ORAL | 0 refills | Status: DC

## 2022-09-13 NOTE — Assessment & Plan Note (Signed)
BP Readings from Last 1 Encounters:  09/13/22 136/76   Well-controlled with Metoprolol and Cardizem Counseled for compliance with the medications Advised DASH diet and moderate exercise/walking as tolerated

## 2022-09-13 NOTE — Assessment & Plan Note (Signed)
Her knee pain is likely due to OA of knee Pain and swelling behind the knee likely from Baker's cyst Unable to take oral NSAIDs due to CKD Prednisone 20 mg X 5 days Tramadol as needed for severe pain

## 2022-09-13 NOTE — Progress Notes (Signed)
Acute Office Visit  Subjective:    Patient ID: [name], female    DOB: 1934-08-05, 88 y.o.   MRN: [id number]  Chief Complaint  Patient presents with   Knee Pain    Patient states her left knee is painful for a week now    HPI Patient is in today for complaint of left knee pain, on the medial aspect, which is intermittent since visit 06/25.  Denies any recent injury.  Her knee pain is intermittent, dull, and is associated with knee swelling.  She has also noticed a bulging on the back of her knee.  She has not taken any new medicine recently.  Past Medical History:  Diagnosis Date   Allergy    Arthritis    Colon cancer (HCC)    colon ca dx 07/30/09   History of cardiac monitoring 07/2017   "Event monitor demonstrated sinus rhythm with isolated PACs and no arrhythmias"   History of colon cancer 06/2009   found at time of TCS 06/29/09, 1.2cm sessile cecal polyp, no adjuvent therapy needed   HTN (hypertension)    Hx of cardiovascular stress test 07/2017   "No diagnostic ST segment changes to indicate ischemia. Small, moderate intensity, reversible apical to basal inferolateral defect consistent with ischemia. This is a low risk study. Nuclear stress EF: 84%."   Hyperlipidemia    Hypothyroidism    PE (pulmonary thromboembolism) (HCC)    Renal disorder    cyst on kidney    Silent micro-hemorrhage of brain (HCC) 11/25/2018   Stroke [facility])    TIA (transient ischemic attack) 11/25/2018   Vertigo     Past Surgical History:  Procedure Laterality Date   ABDOMINAL HYSTERECTOMY     COLON SURGERY  07/2009   right hemicolectomy, no residual colon cancer on path   COLONOSCOPY  07/16/2010   [id number] POLYP-TCS 3 YEARS   COLONOSCOPY N/A 08/02/2013   hyperplastic polyps, surveillance in 2020 if benefits outweight the risks   COLONOSCOPY  06/2009   1.2 cm sessile cecal polyp which had adenocarcinoma arising in a tubular adenoma.   PARTIAL THYMECTOMY     partial  thyroidectomy     benign tumors    Family History  Problem Relation Age of Onset   Colon cancer Mother        >age44   Arthritis Mother    Cancer Mother    Heart disease Mother    Hyperlipidemia Mother    Hypertension Mother    Heart attack Father    Heart disease Father    Diabetes Maternal Aunt    Hyperlipidemia Daughter    Hypertension Daughter    Liver disease Neg Hx     Social History   Socioeconomic History   Marital status: Widowed    Spouse name: Not on file   Number of children: 2   Years of education: Not on file   Highest education level: Not on file  Occupational History   Occupation: Agricultural consultant at [employer]    Employer: RETIRED   Occupation: retired from Designer, fashion/clothing  Tobacco Use   Smoking status: Never   Smokeless tobacco: Never  Vaping Use   Vaping Use: Never used  Substance and Sexual Activity   Alcohol use: No    Alcohol/week: 0.0 standard drinks of alcohol   Drug use: No   Sexual activity: Not Currently  Other Topics Concern   Not on file  Social History Narrative   Not on file   Social Determinants  of Health   Financial Resource Strain: Low Risk  (11/13/2020)   Overall Financial Resource Strain (CARDIA)    Difficulty of Paying Living Expenses: Not very hard  Food Insecurity: No Food Insecurity (02/13/2022)   Hunger Vital Sign    Worried About Running Out of Food in the Last Year: Never true    Ran Out of Food in the Last Year: Never true  Transportation Needs: No Transportation Needs (02/17/2022)   PRAPARE - Administrator, Civil Service (Medical): No    Lack of Transportation (Non-Medical): No  Physical Activity: Insufficiently Active (11/22/2021)   Exercise Vital Sign    Days of Exercise per Week: 4 days    Minutes of Exercise per Session: 10 min  Stress: No Stress Concern Present (11/13/2020)   [employer] of Occupational Health - Occupational Stress Questionnaire    Feeling of Stress : Only a little  Social Connections:  Moderately Isolated (11/13/2020)   Social Connection and Isolation Panel [NHANES]    Frequency of Communication with Friends and Family: More than three times a week    Frequency of Social Gatherings with Friends and Family: More than three times a week    Attends Religious Services: More than 4 times per year    Active Member of [employer] or Organizations: No    Attends Banker Meetings: Never    Marital Status: Widowed  Intimate Partner Violence: Not At Risk (02/13/2022)   Humiliation, Afraid, Rape, and Kick questionnaire    Fear of Current or Ex-Partner: No    Emotionally Abused: No    Physically Abused: No    Sexually Abused: No    Outpatient Medications Prior to Visit  Medication Sig Dispense Refill   acetaminophen (TYLENOL) 500 MG tablet Take 500 mg by mouth every 8 (eight) hours as needed for mild pain or headache.     ascorbic acid (VITAMIN C) 250 MG CHEW Chew 250 mg by mouth daily.     ASPERCREME LIDOCAINE EX Apply 1 application topically daily as needed (for knee pain).      cholecalciferol (VITAMIN D3) 25 MCG (1000 UT) tablet Take 1,000 Units by mouth daily.      clopidogrel (PLAVIX) 75 MG tablet TAKE ONE TABLET BY MOUTH ONCE DAILY. 90 tablet 1   diltiazem (CARDIZEM CD) 120 MG 24 hr capsule Take 1 capsule (120 mg total) by mouth daily. 90 capsule 3   ezetimibe (ZETIA) 10 MG tablet Take 1 tablet (10 mg total) by mouth daily. 90 tablet 3   gabapentin (NEURONTIN) 300 MG capsule Take 300 mg by mouth 3 (three) times daily.     levETIRAcetam (KEPPRA) 500 MG tablet Take 250 mg by mouth 2 (two) times daily.     Lifitegrast (XIIDRA) 5 % SOLN Apply 1 drop to eye daily as needed (dry eyes).     LINZESS 72 MCG capsule TAKE (1) CAPSULE BY MOUTH DAILY AS NEEDED FOR CONSTIPATION. 30 capsule 0   loratadine (CLARITIN) 10 MG tablet Take 10 mg by mouth daily.      meclizine (ANTIVERT) 25 MG tablet Take 1 tablet (25 mg total) by mouth 3 (three) times daily as needed for dizziness. 15  tablet 0   metoprolol succinate (TOPROL-XL) 100 MG 24 hr tablet TAKE 1 TABLET BY MOUTH INTHE MORNING AND AT BEDTIME. TAKE WITH OR IMMEDIATELY FOLLOWING A MEAL. 180 tablet 1   rosuvastatin (CRESTOR) 40 MG tablet TAKE ONE TABLET BY MOUTH ONCE DAILY. 90 tablet 2  topiramate (TOPAMAX) 50 MG tablet Take 50 mg by mouth 2 (two) times daily.     traMADol (ULTRAM) 50 MG tablet Take 25 mg by mouth every 8 (eight) hours as needed for moderate pain.     No facility-administered medications prior to visit.    Allergies  Allergen Reactions   Tape Rash    Zio monitor adhesive causes ulcerated and infected skin .Had to see derm    Review of Systems  Constitutional:  Negative for chills and fever.  HENT:  Negative for congestion, sinus pressure, sinus pain and sore throat.   Eyes:  Negative for pain and discharge.  Respiratory:  Negative for cough and shortness of breath.   Cardiovascular:  Positive for leg swelling. Negative for chest pain and palpitations.  Gastrointestinal:  Negative for abdominal pain, constipation, diarrhea, nausea and vomiting.  Endocrine: Negative for polydipsia and polyuria.  Genitourinary:  Negative for dysuria and hematuria.  Musculoskeletal:  Positive for arthralgias (L knee). Negative for neck pain and neck stiffness.  Skin:  Negative for rash.  Neurological:  Positive for dizziness. Negative for weakness.  Psychiatric/Behavioral:  Negative for agitation and behavioral problems.        Objective:    Physical Exam Vitals reviewed.  Constitutional:      General: She is not in acute distress.    Appearance: She is not diaphoretic.  Eyes:     General: No scleral icterus.    Extraocular Movements: Extraocular movements intact.  Cardiovascular:     Rate and Rhythm: Normal rate and regular rhythm.     Heart sounds: Normal heart sounds. No murmur heard. Pulmonary:     Breath sounds: Normal breath sounds. No wheezing or rales.  Musculoskeletal:     Cervical back:  Neck supple. No tenderness.     Left knee: Swelling present. Decreased range of motion. Tenderness present over the medial joint line.     Right lower leg: No edema.     Left lower leg: No edema.  Skin:    General: Skin is warm.     Findings: No rash.  Neurological:     General: No focal deficit present.     Mental Status: She is alert and oriented to person, place, and time.     Sensory: Sensory deficit (Left UE) present.     Motor: Weakness (Left UE and LE - muscle strength 4/5) present.  Psychiatric:        Mood and Affect: Mood normal.        Behavior: Behavior normal.     BP 136/76 (BP Location: Left Arm)   Pulse 64   Ht 5\' 4"  (1.626 m)   Wt 151 lb 6.4 oz (68.7 kg)   SpO2 95%   BMI 25.99 kg/m  Wt Readings from Last 3 Encounters:  09/13/22 151 lb 6.4 oz (68.7 kg)  07/21/22 150 lb 12.8 oz (68.4 kg)  05/23/22 146 lb 8 oz (66.5 kg)        Assessment & Plan:   Problem List Items Addressed This Visit       Cardiovascular and Mediastinum   Essential hypertension    BP Readings from Last 1 Encounters:  09/13/22 136/76  Well-controlled with Metoprolol and Cardizem Counseled for compliance with the medications Advised DASH diet and moderate exercise/walking as tolerated        Musculoskeletal and Integument   OA (osteoarthritis) of knee - Primary    Her knee pain is likely due to OA of knee  Pain and swelling behind the knee likely from Baker's cyst Unable to take oral NSAIDs due to CKD Prednisone 20 mg X 5 days Tramadol as needed for severe pain      Relevant Medications   predniSONE (DELTASONE) 20 MG tablet   Baker cyst, left    Swelling behind the knee likely from Baker's cyst Rest and leg elevation advised Advised to apply ice Prednisone 20 mg X 5 days        Relevant Medications   predniSONE (DELTASONE) 20 MG tablet     Genitourinary   Chronic kidney disease, stage 3b (HCC)    Last CMP reviewed, CKD stage 3b, stable GFR in 30s F/u with  Nephrology Avoid nephrotoxic agents No proteinuria currently according to Nephrology chart        Meds ordered this encounter  Medications   predniSONE (DELTASONE) 20 MG tablet    Sig: Take 1 tablet (20 mg total) by mouth daily with breakfast.    Dispense:  5 tablet    Refill:  0     Chantavia Bazzle Concha Se, MD

## 2022-09-13 NOTE — Assessment & Plan Note (Signed)
Swelling behind the knee likely from Baker's cyst Rest and leg elevation advised Advised to apply ice Prednisone 20 mg X 5 days

## 2022-09-13 NOTE — Assessment & Plan Note (Signed)
Last CMP reviewed, CKD stage 3b, stable GFR in 30s F/u with Nephrology Avoid nephrotoxic agents No proteinuria currently according to Nephrology chart 

## 2022-09-13 NOTE — Patient Instructions (Signed)
Please take Prednisone as prescribed.  Please take Tramadol half tablet once daily for knee pain.  Please apply ice and perform leg elevation as tolerated.

## 2022-09-17 ENCOUNTER — Other Ambulatory Visit: Payer: Self-pay | Admitting: Internal Medicine

## 2022-09-22 ENCOUNTER — Ambulatory Visit: Payer: Medicare Other | Attending: Cardiology | Admitting: Cardiology

## 2022-09-22 ENCOUNTER — Encounter: Payer: Self-pay | Admitting: Cardiology

## 2022-09-22 VITALS — BP 128/78 | HR 68 | Ht 64.0 in | Wt 153.0 lb

## 2022-09-22 DIAGNOSIS — E782 Mixed hyperlipidemia: Secondary | ICD-10-CM | POA: Diagnosis not present

## 2022-09-22 DIAGNOSIS — I10 Essential (primary) hypertension: Secondary | ICD-10-CM

## 2022-09-22 DIAGNOSIS — R002 Palpitations: Secondary | ICD-10-CM

## 2022-09-22 MED ORDER — DILTIAZEM HCL ER COATED BEADS 180 MG PO CP24: 180.0000 mg | ORAL_CAPSULE | Freq: Every day | ORAL | 3 refills | Status: DC

## 2022-09-22 NOTE — Progress Notes (Signed)
Clinical Summary Ms. Basich is a 88 y.o.female seen today for follow up of the following medical problems.    1. Palpitations - prior monitor showed PACs, PVCs, short runs of SVT longest 11 seconds  -severe allergy to monitor.    - taking toprol 100mg  bid and dilt 30mg  bid - ongoing palpitations, increased frequency  - last visit due to palpitations increased dilt to 60mg  bid, later changed to long acting dilt 120mg  daily. -takes dilt 120mg  in AM, takes additional 30mg  as needed at night.  - recent TSH was 0.15, thyroid medicine was stopped.  - recently on prednisone, just completed course.  - no caffeine, no EtOH   2. HTN - compliant with meds   3. Unprovoked Pulmonary embolus 04/26/18 -  followed by intracranial hemorrhage on eliquis which was stopped and she's now on ASA 325 mg daily.   4. Hyperlipidemia - 09/2019 TC 130 HDL 46 TG 107 LDL 65 - 12/2020 TC 122 TG 43 HDL 52 LDL 61 - 07/2021 TC 098 TG 28 HDL 62 LDL 76 - 01/2022 TC 157 TG 85 HDL 64 LDL 77 - she is on crestor 40mg  daily. Last visit we added zetia 10mg  daily.      5. History of seizures - 12/2020 admitted with AMS, thought to be postictal from seizure   6. TIA - changed from ASA to plavix 07/2021         Past Medical History:  Diagnosis Date   Allergy    Arthritis    Colon cancer (HCC)    colon ca dx 07/30/09   History of cardiac monitoring 07/2017   "Event monitor demonstrated sinus rhythm with isolated PACs and no arrhythmias"   History of colon cancer 06/2009   found at time of TCS 06/29/09, 1.2cm sessile cecal polyp, no adjuvent therapy needed   HTN (hypertension)    Hx of cardiovascular stress test 07/2017   "No diagnostic ST segment changes to indicate ischemia. Small, moderate intensity, reversible apical to basal inferolateral defect consistent with ischemia. This is a low risk study. Nuclear stress EF: 84%."   Hyperlipidemia    Hypothyroidism    PE (pulmonary thromboembolism) (HCC)     Renal disorder    cyst on kidney    Silent micro-hemorrhage of brain (HCC) 11/25/2018   Stroke (HCC)    TIA (transient ischemic attack) 11/25/2018   Vertigo      Allergies  Allergen Reactions   Tape Rash    Zio monitor adhesive causes ulcerated and infected skin .Had to see derm     Current Outpatient Medications  Medication Sig Dispense Refill   acetaminophen (TYLENOL) 500 MG tablet Take 500 mg by mouth every 8 (eight) hours as needed for mild pain or headache.     ascorbic acid (VITAMIN C) 250 MG CHEW Chew 250 mg by mouth daily.     ASPERCREME LIDOCAINE EX Apply 1 application topically daily as needed (for knee pain).      cholecalciferol (VITAMIN D3) 25 MCG (1000 UT) tablet Take 1,000 Units by mouth daily.      clopidogrel (PLAVIX) 75 MG tablet TAKE ONE TABLET BY MOUTH ONCE DAILY. 90 tablet 1   diltiazem (CARDIZEM CD) 120 MG 24 hr capsule Take 1 capsule (120 mg total) by mouth daily. 90 capsule 3   ezetimibe (ZETIA) 10 MG tablet Take 1 tablet (10 mg total) by mouth daily. 90 tablet 3   gabapentin (NEURONTIN) 300 MG capsule Take 300 mg  by mouth 3 (three) times daily.     levETIRAcetam (KEPPRA) 500 MG tablet Take 250 mg by mouth 2 (two) times daily.     Lifitegrast (XIIDRA) 5 % SOLN Apply 1 drop to eye daily as needed (dry eyes).     LINZESS 72 MCG capsule TAKE (1) CAPSULE BY MOUTH DAILY AS NEEDED FOR CONSTIPATION. 30 capsule 0   loratadine (CLARITIN) 10 MG tablet Take 10 mg by mouth daily.      meclizine (ANTIVERT) 25 MG tablet Take 1 tablet (25 mg total) by mouth 3 (three) times daily as needed for dizziness. 15 tablet 0   metoprolol succinate (TOPROL-XL) 100 MG 24 hr tablet TAKE 1 TABLET BY MOUTH INTHE MORNING AND AT BEDTIME. TAKE WITH OR IMMEDIATELY FOLLOWING A MEAL. 180 tablet 1   predniSONE (DELTASONE) 20 MG tablet Take 1 tablet (20 mg total) by mouth daily with breakfast. 5 tablet 0   rosuvastatin (CRESTOR) 40 MG tablet TAKE ONE TABLET BY MOUTH ONCE DAILY. 90 tablet 2    topiramate (TOPAMAX) 50 MG tablet Take 50 mg by mouth 2 (two) times daily.     traMADol (ULTRAM) 50 MG tablet Take 25 mg by mouth every 8 (eight) hours as needed for moderate pain.     No current facility-administered medications for this visit.     Past Surgical History:  Procedure Laterality Date   ABDOMINAL HYSTERECTOMY     COLON SURGERY  07/2009   right hemicolectomy, no residual colon cancer on path   COLONOSCOPY  07/16/2010   [id number] POLYP-TCS 3 YEARS   COLONOSCOPY N/A 08/02/2013   hyperplastic polyps, surveillance in 2020 if benefits outweight the risks   COLONOSCOPY  06/2009   1.2 cm sessile cecal polyp which had adenocarcinoma arising in a tubular adenoma.   PARTIAL THYMECTOMY     partial thyroidectomy     benign tumors     Allergies  Allergen Reactions   Tape Rash    Zio monitor adhesive causes ulcerated and infected skin .Had to see derm      Family History  Problem Relation Age of Onset   Colon cancer Mother        >age44   Arthritis Mother    Cancer Mother    Heart disease Mother    Hyperlipidemia Mother    Hypertension Mother    Heart attack Father    Heart disease Father    Diabetes Maternal Aunt    Hyperlipidemia Daughter    Hypertension Daughter    Liver disease Neg Hx      Social History Ms. Cabreja reports that she has never smoked. She has never used smokeless tobacco. Ms. Kilbride reports no history of alcohol use.   Review of Systems CONSTITUTIONAL: No weight loss, fever, chills, weakness or fatigue.  HEENT: Eyes: No visual loss, blurred vision, double vision or yellow sclerae.No hearing loss, sneezing, congestion, runny nose or sore throat.  SKIN: No rash or itching.  CARDIOVASCULAR: per hpi RESPIRATORY: No shortness of breath, cough or sputum.  GASTROINTESTINAL: No anorexia, nausea, vomiting or diarrhea. No abdominal pain or blood.  GENITOURINARY: No burning on urination, no polyuria NEUROLOGICAL: No headache,  dizziness, syncope, paralysis, ataxia, numbness or tingling in the extremities. No change in bowel or bladder control.  MUSCULOSKELETAL: No muscle, back pain, joint pain or stiffness.  LYMPHATICS: No enlarged nodes. No history of splenectomy.  PSYCHIATRIC: No history of depression or anxiety.  ENDOCRINOLOGIC: No reports of sweating, cold or heat intolerance. No polyuria or  polydipsia.  Marland Kitchen   Physical Examination Today's Vitals   09/22/22 1532 09/22/22 1603  BP: (!) 156/84 128/78  Pulse: 68   SpO2: 97%   Weight: 153 lb (69.4 kg)   Height: 5\' 4"  (1.626 m)    Body mass index is 26.26 kg/m.  Gen: resting comfortably, no acute distress HEENT: no scleral icterus, pupils equal round and reactive, no palptable cervical adenopathy,  CV: RRR, no mrg, no jvd Resp: Clear to auscultation bilaterally GI: abdomen is soft, non-tender, non-distended, normal bowel sounds, no hepatosplenomegaly MSK: extremities are warm, no edema.  Skin: warm, no rash Neuro:  no focal deficits Psych: appropriate affect   Diagnostic Studies  07/2021 echo 1. Left ventricular ejection fraction, by estimation, is 65 to 70%. The  left ventricle has normal function. The left ventricle has no regional  wall motion abnormalities. There is mild concentric left ventricular  hypertrophy. Left ventricular diastolic  parameters are consistent with Grade I diastolic dysfunction (impaired  relaxation).   2. Right ventricular systolic function is normal. The right ventricular  size is normal. There is mildly elevated pulmonary artery systolic  pressure. The estimated right ventricular systolic pressure is 39.0 mmHg.   3. The mitral valve is grossly normal. Trivial mitral valve  regurgitation.   4. The aortic valve is tricuspid. Aortic valve regurgitation is not  visualized. Aortic valve sclerosis is present, with no evidence of aortic  valve stenosis. Aortic valve mean gradient measures 3.0 mmHg.   5. The inferior vena  cava is normal in size with greater than 50%  respiratory variability, suggesting right atrial pressure of 3 mmHg.    Assessment and Plan  1. Palpitations - prior monitor showed PAC/PVCs and SVT - low TSH, recnt prednisone use could be playing some role with ongoing palpitations - increase ditlaizem to 180mg  daily, can continue dilt short acting 30mg  prn. Continue toprol   2. HTN -at goal, cotninue current meds   3. Hyperlipdiemia -repeat lipid panel   F/u 4 months   Antoine Poche, M.D.

## 2022-09-22 NOTE — Patient Instructions (Addendum)
Medication Instructions:  Your physician has recommended you make the following change in your medication:   -increase Diltiazem to 180 mg daily.  *If you need a refill on your cardiac medications before your next appointment, please call your pharmacy*   Lab Work: Fasting Lipid Panel- Nothing to eat/drink at least 6 hours prior to having lab completed.  If you have labs (blood work) drawn today and your tests are completely normal, you will receive your results only by: MyChart Message (if you have MyChart) OR A paper copy in the mail If you have any lab test that is abnormal or we need to change your treatment, we will call you to review the results.   Testing/Procedures: None   Follow-Up: At [facility], you and your health needs are our priority.  As part of our continuing mission to provide you with exceptional heart care, we have created designated Provider Care Teams.  These Care Teams include your primary Cardiologist (physician) and Advanced Practice Providers (APPs -  Physician Assistants and Nurse Practitioners) who all work together to provide you with the care you need, when you need it.  We recommend signing up for the patient portal called "MyChart".  Sign up information is provided on this After Visit Summary.  MyChart is used to connect with patients for Virtual Visits (Telemedicine).  Patients are able to view lab/test results, encounter notes, upcoming appointments, etc.  Non-urgent messages can be sent to your provider as well.   To learn more about what you can do with MyChart, go to [email or web address].    Your next appointment:   4 month(s)  Provider:   Dina Rich, MD    Other Instructions

## 2022-09-30 ENCOUNTER — Emergency Department (HOSPITAL_COMMUNITY)
Admission: EM | Admit: 2022-09-30 | Discharge: 2022-09-30 | Disposition: A | Discharge status: Home / Self Care | Payer: Medicare Other | Source: Home / Self Care | Attending: Student | Admitting: Student

## 2022-09-30 ENCOUNTER — Emergency Department (HOSPITAL_COMMUNITY): Payer: Medicare Other

## 2022-09-30 ENCOUNTER — Other Ambulatory Visit: Payer: Self-pay

## 2022-09-30 ENCOUNTER — Encounter (HOSPITAL_COMMUNITY): Payer: Self-pay | Admitting: Emergency Medicine

## 2022-09-30 DIAGNOSIS — K5641 Fecal impaction: Secondary | ICD-10-CM | POA: Diagnosis not present

## 2022-09-30 DIAGNOSIS — M79652 Pain in left thigh: Secondary | ICD-10-CM | POA: Diagnosis not present

## 2022-09-30 DIAGNOSIS — N189 Chronic kidney disease, unspecified: Secondary | ICD-10-CM | POA: Diagnosis not present

## 2022-09-30 DIAGNOSIS — I517 Cardiomegaly: Secondary | ICD-10-CM | POA: Diagnosis not present

## 2022-09-30 DIAGNOSIS — I82511 Chronic embolism and thrombosis of right femoral vein: Secondary | ICD-10-CM | POA: Insufficient documentation

## 2022-09-30 DIAGNOSIS — I10 Essential (primary) hypertension: Secondary | ICD-10-CM | POA: Insufficient documentation

## 2022-09-30 DIAGNOSIS — Z86711 Personal history of pulmonary embolism: Secondary | ICD-10-CM | POA: Diagnosis not present

## 2022-09-30 DIAGNOSIS — Z743 Need for continuous supervision: Secondary | ICD-10-CM | POA: Diagnosis not present

## 2022-09-30 DIAGNOSIS — Z7902 Long term (current) use of antithrombotics/antiplatelets: Secondary | ICD-10-CM | POA: Insufficient documentation

## 2022-09-30 DIAGNOSIS — Z8673 Personal history of transient ischemic attack (TIA), and cerebral infarction without residual deficits: Secondary | ICD-10-CM | POA: Insufficient documentation

## 2022-09-30 DIAGNOSIS — Z7189 Other specified counseling: Secondary | ICD-10-CM | POA: Diagnosis not present

## 2022-09-30 DIAGNOSIS — G9341 Metabolic encephalopathy: Secondary | ICD-10-CM | POA: Diagnosis not present

## 2022-09-30 DIAGNOSIS — M25562 Pain in left knee: Secondary | ICD-10-CM | POA: Insufficient documentation

## 2022-09-30 DIAGNOSIS — R911 Solitary pulmonary nodule: Secondary | ICD-10-CM | POA: Diagnosis not present

## 2022-09-30 DIAGNOSIS — I2609 Other pulmonary embolism with acute cor pulmonale: Secondary | ICD-10-CM | POA: Diagnosis not present

## 2022-09-30 DIAGNOSIS — U071 COVID-19: Secondary | ICD-10-CM | POA: Diagnosis not present

## 2022-09-30 DIAGNOSIS — E782 Mixed hyperlipidemia: Secondary | ICD-10-CM | POA: Diagnosis not present

## 2022-09-30 DIAGNOSIS — E039 Hypothyroidism, unspecified: Secondary | ICD-10-CM | POA: Insufficient documentation

## 2022-09-30 DIAGNOSIS — M79605 Pain in left leg: Secondary | ICD-10-CM | POA: Diagnosis not present

## 2022-09-30 DIAGNOSIS — Z8616 Personal history of COVID-19: Secondary | ICD-10-CM | POA: Diagnosis not present

## 2022-09-30 DIAGNOSIS — Z515 Encounter for palliative care: Secondary | ICD-10-CM | POA: Diagnosis not present

## 2022-09-30 DIAGNOSIS — E890 Postprocedural hypothyroidism: Secondary | ICD-10-CM | POA: Diagnosis not present

## 2022-09-30 DIAGNOSIS — Z66 Do not resuscitate: Secondary | ICD-10-CM | POA: Diagnosis not present

## 2022-09-30 DIAGNOSIS — Z79899 Other long term (current) drug therapy: Secondary | ICD-10-CM | POA: Insufficient documentation

## 2022-09-30 DIAGNOSIS — I700 Atherosclerosis of aorta: Secondary | ICD-10-CM | POA: Diagnosis not present

## 2022-09-30 DIAGNOSIS — I2602 Saddle embolus of pulmonary artery with acute cor pulmonale: Secondary | ICD-10-CM | POA: Diagnosis not present

## 2022-09-30 DIAGNOSIS — Z7901 Long term (current) use of anticoagulants: Secondary | ICD-10-CM | POA: Diagnosis not present

## 2022-09-30 DIAGNOSIS — M170 Bilateral primary osteoarthritis of knee: Secondary | ICD-10-CM | POA: Diagnosis not present

## 2022-09-30 DIAGNOSIS — R531 Weakness: Secondary | ICD-10-CM | POA: Diagnosis not present

## 2022-09-30 DIAGNOSIS — R569 Unspecified convulsions: Secondary | ICD-10-CM | POA: Diagnosis not present

## 2022-09-30 DIAGNOSIS — R0602 Shortness of breath: Secondary | ICD-10-CM | POA: Diagnosis not present

## 2022-09-30 DIAGNOSIS — Z8249 Family history of ischemic heart disease and other diseases of the circulatory system: Secondary | ICD-10-CM | POA: Diagnosis not present

## 2022-09-30 DIAGNOSIS — Z86010 Personal history of colonic polyps: Secondary | ICD-10-CM | POA: Diagnosis not present

## 2022-09-30 DIAGNOSIS — Z91048 Other nonmedicinal substance allergy status: Secondary | ICD-10-CM | POA: Diagnosis not present

## 2022-09-30 DIAGNOSIS — Z0389 Encounter for observation for other suspected diseases and conditions ruled out: Secondary | ICD-10-CM | POA: Diagnosis not present

## 2022-09-30 DIAGNOSIS — M1712 Unilateral primary osteoarthritis, left knee: Secondary | ICD-10-CM | POA: Diagnosis not present

## 2022-09-30 DIAGNOSIS — E860 Dehydration: Secondary | ICD-10-CM | POA: Diagnosis not present

## 2022-09-30 DIAGNOSIS — E049 Nontoxic goiter, unspecified: Secondary | ICD-10-CM | POA: Diagnosis not present

## 2022-09-30 DIAGNOSIS — N179 Acute kidney failure, unspecified: Secondary | ICD-10-CM | POA: Diagnosis not present

## 2022-09-30 DIAGNOSIS — I2699 Other pulmonary embolism without acute cor pulmonale: Secondary | ICD-10-CM | POA: Diagnosis not present

## 2022-09-30 DIAGNOSIS — R6889 Other general symptoms and signs: Secondary | ICD-10-CM | POA: Diagnosis not present

## 2022-09-30 LAB — URINALYSIS, ROUTINE W REFLEX MICROSCOPIC
Bacteria, UA: NONE SEEN
Bilirubin Urine: NEGATIVE
Glucose, UA: NEGATIVE mg/dL
Hgb urine dipstick: NEGATIVE
Ketones, ur: NEGATIVE mg/dL
Nitrite: NEGATIVE
Protein, ur: NEGATIVE mg/dL
Specific Gravity, Urine: 1.009 (ref 1.005–1.030)
pH: 6 (ref 5.0–8.0)

## 2022-09-30 MED ORDER — TRAMADOL HCL 50 MG PO TABS: 25.0000 mg | ORAL_TABLET | Freq: Two times a day (BID) | ORAL | 0 refills | Status: AC | PRN

## 2022-09-30 NOTE — ED Triage Notes (Signed)
Pt via POV c/o continued left leg pain for several weeks from the knee down. Pt was seen on July 2 and received rx for steroids but her pain has not improved. Pt reports difficulty ambulating due to the pain.

## 2022-09-30 NOTE — ED Provider Notes (Signed)
[facility] AT [facility] Provider Note   CSN: [id number] Arrival date & time: 09/30/22  1318     History  Chief Complaint  Patient presents with   Leg Pain    [name] is a 88 y.o. female with a history including hyperlipidemia, history of CVA on Plavix, hypertension, history of PE, hypothyroidism and known bilateral knee arthritis presenting for evaluation of increased left knee and medial left thigh pain which has been present for several weeks, she was seen for the same complaint on July 2 by her primary provider and was placed on prednisone for treatment of osteoarthritis, she states this medication has not improved her pain, having increasing difficulty ambulating without her   The pain radiates into her left medial calf as well.  She denies increased swelling in her legs.  She has taken tramadol and Tylenol which does offer transient pain relief.  She also uses Aspercreme lidocaine which is less effective.  The history is provided by the patient.       Home Medications Prior to Admission medications   Medication Sig Start Date End Date Taking? Authorizing Provider  traMADol (ULTRAM) 50 MG tablet Take 0.5-1 tablets (25-50 mg total) by mouth every 12 (twelve) hours as needed for severe pain. 09/30/22  Yes Tonda Wiederhold, Raynelle Fanning, PA-C  acetaminophen (TYLENOL) 500 MG tablet Take 500 mg by mouth every 8 (eight) hours as needed for mild pain or headache.    [provider]  ascorbic acid (VITAMIN C) 250 MG CHEW Chew 250 mg by mouth daily.    [provider]  ASPERCREME LIDOCAINE EX Apply 1 application topically daily as needed (for knee pain).     [provider]  cholecalciferol (VITAMIN D3) 25 MCG (1000 UT) tablet Take 1,000 Units by mouth daily.     [provider]  clopidogrel (PLAVIX) 75 MG tablet TAKE ONE TABLET BY MOUTH ONCE DAILY. 06/27/22   Antoine Poche, MD  diltiazem (CARDIZEM CD) 180 MG 24 hr capsule  Take 1 capsule (180 mg total) by mouth daily. 09/22/22   Antoine Poche, MD  diltiazem (CARDIZEM) 30 MG tablet Take 30 mg by mouth every 8 (eight) hours as needed (Palpitations).    [provider]  ezetimibe (ZETIA) 10 MG tablet Take 1 tablet (10 mg total) by mouth daily. 04/05/22 03/31/23  Antoine Poche, MD  gabapentin (NEURONTIN) 300 MG capsule Take 300 mg by mouth 3 (three) times daily.    [provider]  levETIRAcetam (KEPPRA) 500 MG tablet Take 250 mg by mouth 2 (two) times daily.    [provider]  Lifitegrast Benay Spice) 5 % SOLN Apply 1 drop to eye daily as needed (dry eyes).    [provider]  LINZESS 72 MCG capsule TAKE (1) CAPSULE BY MOUTH DAILY AS NEEDED FOR CONSTIPATION. 09/19/22   Anabel Halon, MD  loratadine (CLARITIN) 10 MG tablet Take 10 mg by mouth daily.     [provider]  meclizine (ANTIVERT) 25 MG tablet Take 1 tablet (25 mg total) by mouth 3 (three) times daily as needed for dizziness. 11/03/20   Geoffery Lyons, MD  metoprolol succinate (TOPROL-XL) 100 MG 24 hr tablet TAKE 1 TABLET BY MOUTH INTHE MORNING AND AT BEDTIME. TAKE WITH OR IMMEDIATELY FOLLOWING A MEAL. 06/27/22   Antoine Poche, MD  predniSONE (DELTASONE) 20 MG tablet Take 1 tablet (20 mg total) by mouth daily with breakfast. 09/13/22   Allena Katz, Earlie Lou,  MD  rosuvastatin (CRESTOR) 40 MG tablet TAKE ONE TABLET BY MOUTH ONCE DAILY. 09/12/22   Antoine Poche, MD  topiramate (TOPAMAX) 50 MG tablet Take 50 mg by mouth 2 (two) times daily.    [provider]  traMADol (ULTRAM) 50 MG tablet Take 25 mg by mouth every 8 (eight) hours as needed for moderate pain. 01/10/19   [provider]      Allergies    Tape    Review of Systems   Review of Systems  Constitutional:  Negative for fever.  Musculoskeletal:  Positive for arthralgias. Negative for joint swelling and myalgias.  Neurological:  Negative for weakness and numbness.  All other systems  reviewed and are negative.   Physical Exam Updated Vital Signs BP (!) 196/95   Pulse 61   Temp 97.9 F (36.6 C) (Oral)   Resp 18   Ht 5\' 4"  (1.626 m)   Wt 69.4 kg   SpO2 97%   BMI 26.26 kg/m  Physical Exam Constitutional:      Appearance: She is well-developed.  HENT:     Head: Atraumatic.  Cardiovascular:     Comments: Pulses equal bilaterally Musculoskeletal:        General: Tenderness present.     Cervical back: Normal range of motion.     Left upper leg: Tenderness present.     Comments: Tender to palpation left medial lower thigh, through her medial knee joint and upper calf without palpable cords, induration or erythema.  Positive Homans' sign left.  Dorsalis pedal pulses are full.  No lower extremity edema.  Skin:    General: Skin is warm and dry.  Neurological:     Mental Status: She is alert.     Sensory: No sensory deficit.     Motor: No weakness.     Deep Tendon Reflexes: Reflexes normal.     ED Results / Procedures / Treatments   Labs (all labs ordered are listed, but only abnormal results are displayed) Labs Reviewed  URINALYSIS, ROUTINE W REFLEX MICROSCOPIC - Abnormal; Notable for the following components:      Result Value   Leukocytes,Ua TRACE (*)    All other components within normal limits    EKG None  Radiology US Venous Img Lower Unilateral Left  Result Date: 09/30/2022 CLINICAL DATA:  Eighty-five extremity EXAM: LEFT LOWER EXTREMITY VENOUS DOPPLER ULTRASOUND TECHNIQUE: Gray-scale sonography with graded compression, as well as color Doppler and duplex ultrasound were performed to evaluate the left lower extremity deep venous systems from the level of the common femoral vein and including the common femoral, femoral, profunda femoral, popliteal and calf veins including the posterior tibial, peroneal and gastrocnemius veins when visible. Spectral Doppler was utilized to evaluate flow at rest and with distal augmentation maneuvers in the common  femoral, femoral and popliteal veins. The contralateral common femoral vein was also evaluated for comparison. COMPARISON:  11/09/2020 FINDINGS: LEFT LOWER EXTREMITY Common Femoral Vein: No evidence of thrombus. Normal compressibility, respiratory phasicity and response to augmentation. Central Greater Saphenous Vein: No evidence of thrombus. Normal compressibility and flow on color Doppler imaging. Central Profunda Femoral Vein: No evidence of thrombus. Normal compressibility and flow on color Doppler imaging. Femoral Vein: No evidence of thrombus. Normal compressibility, respiratory phasicity and response to augmentation. Popliteal Vein: No evidence of thrombus. Normal compressibility, respiratory phasicity and response to augmentation. Calf Veins: No evidence of thrombus. Normal compressibility and flow on color Doppler imaging. Venous Reflux:  None. Other Findings:  None. RIGHT LOWER EXTREMITY Common Femoral Vein: No evidence of thrombus. Normal compressibility, respiratory phasicity and response to augmentation. There is subacute to chronic appearing nonocclusive deep vein thrombosis within central profundal femoral vein. IMPRESSION: 1. No evidence of left lower extremity deep venous thrombosis. 2. Subacute to chronic appearing nonocclusive deep vein thrombosis within central RIGHT profunda femoral vein. Consider dedicated right lower extremity venous duplex for further characterization as clinically indicated. Marliss Coots, MD [facility] Radiology Electronically Signed   By: Marliss Coots M.D.   On: 09/30/2022 16:54   DG Knee Left Port  Result Date: 09/30/2022 CLINICAL DATA:  144615 Pain 144615 EXAM: PORTABLE LEFT KNEE - 1-2 VIEW COMPARISON:  None Available. FINDINGS: Four views of the left knee. No evidence of fracture, dislocation, or joint effusion. Mild degenerative changes in the medial tibiofemoral and patellofemoral compartments. Soft tissues are  unremarkable. IMPRESSION: Mild degenerative changes in the medial tibiofemoral and patellofemoral compartments. Electronically Signed   By: Orvan Falconer M.D.   On: 09/30/2022 14:07    Procedures Procedures    Medications Ordered in ED Medications - No data to display  ED Course/ Medical Decision Making/ A&P                             Medical Decision Making Patient presenting with left knee pain with known osteoarthritis but also has had a history of PE, has had multiple DVT studies but no source of the PE was ever found.  She does have some tenderness of her left medial thigh through her left calf and has positive Homans' sign, therefore she does have high risk for DVT.  An ultrasound study has been ordered.  She is on Plavix chronically.   Amount and/or Complexity of Data Reviewed Labs: ordered.    Details: UA negative Radiology: ordered.    Details: Plain film imaging with mild medial osteoarthritis.  Reviewed and agree with interpretation.  Ultrasound venous imaging completed, left leg is negative for DVT, there appears to be a subacute to chronic nonoccluding DVT of the right central profundal femoral vein.  Of note, patient has no complaint of pain or edema of this extremity. Discussion of management or test interpretation with external provider(s): Patient's ultrasound results were discussed with Dr. Chestine Spore of vascular, he recommends close follow-up with their DVT clinic, he would not anticoagulate her given she is asymptomatic in her right leg.  Risk Prescription drug management. Risk Details: Plan of care discussed with patient and daughter at bedside, both are agreeable to this plan.  She actually does have follow-up with her primary provider in 6 days, she is also given referral to the DVT clinic, in the interim I suspect her left pain is secondary to osteoarthritis, we discussed home treatment including increasing her tramadol, she currently takes 1/4 tablet sparingly, took  1 yesterday, she does endorse significant improvement in her pain symptoms with this medication, she was encouraged to take it more frequently, particularly nightly to help her sleep better.  She also gets good relief with the Aspercreme lidocaine and was encouraged to continue this medication as well.           Final Clinical Impression(s) / ED Diagnoses Final diagnoses:  Acute pain of left knee  Chronic deep vein thrombosis (DVT) of femoral vein of right lower extremity (HCC)  Hypertension, unspecified type    Rx / DC Orders ED Discharge Orders  Ordered    traMADol (ULTRAM) 50 MG tablet  Every 12 hours PRN        09/30/22 1835    AMB Referral to Deep Vein Thrombosis Clinic       Comments: Please call [phone] with any questions.   09/30/22 1844              Burgess Amor, PA-C 09/30/22 1902    Kommor, Wyn Forster, MD 10/01/22 1255

## 2022-09-30 NOTE — ED Notes (Signed)
Pt ambulated to restroom and back with standby assist and use of personal cane. No gait abnormality noted.

## 2022-09-30 NOTE — Discharge Instructions (Addendum)
As discussed I suspect your left knee pain is secondary to arthritis.  I do recommend using your tramadol especially at bedtime to help you get a better night sleep.  You may also continue using your topical lidocaine pain reliever.  Your ultrasound shows that you have what appears to be a chronic nonoccluding blood clot in your right lower leg which will require further evaluation.  I am referring you to a vascular specialist for them to repeat your ultrasound and determine if you need any further treatment for today's finding.  In the interim continue your other home medications.  Your blood pressure is elevated here today, make sure you are taking your home blood pressure medications.

## 2022-09-30 NOTE — ED Notes (Signed)
US at bedside

## 2022-10-02 ENCOUNTER — Inpatient Hospital Stay (HOSPITAL_COMMUNITY)
Admission: EM | Admit: 2022-10-02 | Discharge: 2022-10-06 | DRG: 175 | Disposition: A | Discharge status: Home Health | Payer: Medicare Other | Attending: Family Medicine | Admitting: Family Medicine

## 2022-10-02 ENCOUNTER — Emergency Department (HOSPITAL_COMMUNITY): Payer: Medicare Other

## 2022-10-02 DIAGNOSIS — I517 Cardiomegaly: Secondary | ICD-10-CM | POA: Diagnosis not present

## 2022-10-02 DIAGNOSIS — Z8673 Personal history of transient ischemic attack (TIA), and cerebral infarction without residual deficits: Secondary | ICD-10-CM

## 2022-10-02 DIAGNOSIS — E049 Nontoxic goiter, unspecified: Secondary | ICD-10-CM | POA: Diagnosis present

## 2022-10-02 DIAGNOSIS — I82511 Chronic embolism and thrombosis of right femoral vein: Secondary | ICD-10-CM | POA: Diagnosis present

## 2022-10-02 DIAGNOSIS — Z83438 Family history of other disorder of lipoprotein metabolism and other lipidemia: Secondary | ICD-10-CM

## 2022-10-02 DIAGNOSIS — Z515 Encounter for palliative care: Secondary | ICD-10-CM

## 2022-10-02 DIAGNOSIS — G9341 Metabolic encephalopathy: Secondary | ICD-10-CM | POA: Diagnosis present

## 2022-10-02 DIAGNOSIS — Z86711 Personal history of pulmonary embolism: Secondary | ICD-10-CM

## 2022-10-02 DIAGNOSIS — Z8261 Family history of arthritis: Secondary | ICD-10-CM

## 2022-10-02 DIAGNOSIS — Z7952 Long term (current) use of systemic steroids: Secondary | ICD-10-CM

## 2022-10-02 DIAGNOSIS — I2609 Other pulmonary embolism with acute cor pulmonale: Principal | ICD-10-CM | POA: Diagnosis present

## 2022-10-02 DIAGNOSIS — Z8249 Family history of ischemic heart disease and other diseases of the circulatory system: Secondary | ICD-10-CM

## 2022-10-02 DIAGNOSIS — N179 Acute kidney failure, unspecified: Secondary | ICD-10-CM | POA: Diagnosis present

## 2022-10-02 DIAGNOSIS — E860 Dehydration: Secondary | ICD-10-CM | POA: Diagnosis not present

## 2022-10-02 DIAGNOSIS — R911 Solitary pulmonary nodule: Secondary | ICD-10-CM | POA: Diagnosis present

## 2022-10-02 DIAGNOSIS — M79605 Pain in left leg: Secondary | ICD-10-CM | POA: Diagnosis not present

## 2022-10-02 DIAGNOSIS — Z85038 Personal history of other malignant neoplasm of large intestine: Secondary | ICD-10-CM

## 2022-10-02 DIAGNOSIS — R569 Unspecified convulsions: Secondary | ICD-10-CM | POA: Diagnosis present

## 2022-10-02 DIAGNOSIS — Z7901 Long term (current) use of anticoagulants: Secondary | ICD-10-CM

## 2022-10-02 DIAGNOSIS — M79652 Pain in left thigh: Secondary | ICD-10-CM | POA: Diagnosis present

## 2022-10-02 DIAGNOSIS — E782 Mixed hyperlipidemia: Secondary | ICD-10-CM | POA: Diagnosis present

## 2022-10-02 DIAGNOSIS — Z91048 Other nonmedicinal substance allergy status: Secondary | ICD-10-CM

## 2022-10-02 DIAGNOSIS — Z66 Do not resuscitate: Secondary | ICD-10-CM | POA: Diagnosis present

## 2022-10-02 DIAGNOSIS — N189 Chronic kidney disease, unspecified: Secondary | ICD-10-CM | POA: Diagnosis present

## 2022-10-02 DIAGNOSIS — Z8616 Personal history of COVID-19: Secondary | ICD-10-CM

## 2022-10-02 DIAGNOSIS — I700 Atherosclerosis of aorta: Secondary | ICD-10-CM | POA: Diagnosis not present

## 2022-10-02 DIAGNOSIS — I2699 Other pulmonary embolism without acute cor pulmonale: Secondary | ICD-10-CM | POA: Diagnosis not present

## 2022-10-02 DIAGNOSIS — Z79899 Other long term (current) drug therapy: Secondary | ICD-10-CM

## 2022-10-02 DIAGNOSIS — R6889 Other general symptoms and signs: Secondary | ICD-10-CM | POA: Diagnosis not present

## 2022-10-02 DIAGNOSIS — I10 Essential (primary) hypertension: Secondary | ICD-10-CM | POA: Diagnosis present

## 2022-10-02 DIAGNOSIS — Z743 Need for continuous supervision: Secondary | ICD-10-CM | POA: Diagnosis not present

## 2022-10-02 DIAGNOSIS — Z90710 Acquired absence of both cervix and uterus: Secondary | ICD-10-CM

## 2022-10-02 DIAGNOSIS — U071 COVID-19: Secondary | ICD-10-CM | POA: Diagnosis present

## 2022-10-02 DIAGNOSIS — Z800 Family history of malignant neoplasm of digestive organs: Secondary | ICD-10-CM

## 2022-10-02 DIAGNOSIS — Z7902 Long term (current) use of antithrombotics/antiplatelets: Secondary | ICD-10-CM

## 2022-10-02 DIAGNOSIS — E890 Postprocedural hypothyroidism: Secondary | ICD-10-CM | POA: Diagnosis present

## 2022-10-02 DIAGNOSIS — R531 Weakness: Secondary | ICD-10-CM | POA: Diagnosis not present

## 2022-10-02 DIAGNOSIS — Z86010 Personal history of colonic polyps: Secondary | ICD-10-CM

## 2022-10-02 DIAGNOSIS — R0602 Shortness of breath: Secondary | ICD-10-CM | POA: Diagnosis not present

## 2022-10-02 DIAGNOSIS — M170 Bilateral primary osteoarthritis of knee: Secondary | ICD-10-CM | POA: Diagnosis present

## 2022-10-02 LAB — URINALYSIS, W/ REFLEX TO CULTURE (INFECTION SUSPECTED)
Bacteria, UA: NONE SEEN
Bilirubin Urine: NEGATIVE
Glucose, UA: NEGATIVE mg/dL
Hgb urine dipstick: NEGATIVE
Ketones, ur: NEGATIVE mg/dL
Leukocytes,Ua: NEGATIVE
Nitrite: NEGATIVE
Protein, ur: NEGATIVE mg/dL
Specific Gravity, Urine: 1.012 (ref 1.005–1.030)
pH: 7 (ref 5.0–8.0)

## 2022-10-02 LAB — COMPREHENSIVE METABOLIC PANEL
ALT: 54 U/L — ABNORMAL HIGH (ref 0–44)
AST: 36 U/L (ref 15–41)
Albumin: 3.6 g/dL (ref 3.5–5.0)
Alkaline Phosphatase: 82 U/L (ref 38–126)
Anion gap: 5 (ref 5–15)
BUN: 23 mg/dL (ref 8–23)
CO2: 24 mmol/L (ref 22–32)
Calcium: 8.6 mg/dL — ABNORMAL LOW (ref 8.9–10.3)
Chloride: 111 mmol/L (ref 98–111)
Creatinine, Ser: 1.64 mg/dL — ABNORMAL HIGH (ref 0.44–1.00)
GFR, Estimated: 30 mL/min — ABNORMAL LOW (ref >60)
Glucose, Bld: 107 mg/dL — ABNORMAL HIGH (ref 70–99)
Potassium: 3.6 mmol/L (ref 3.5–5.1)
Sodium: 140 mmol/L (ref 135–145)
Total Bilirubin: 0.7 mg/dL (ref 0.3–1.2)
Total Protein: 7.2 g/dL (ref 6.5–8.1)

## 2022-10-02 LAB — CBC WITH DIFFERENTIAL/PLATELET
Abs Immature Granulocytes: 0.02 10*3/uL (ref 0.00–0.07)
Basophils Absolute: 0 10*3/uL (ref 0.0–0.1)
Basophils Relative: 1 %
Eosinophils Absolute: 0.2 10*3/uL (ref 0.0–0.5)
Eosinophils Relative: 3 %
HCT: 40.9 % (ref 36.0–46.0)
Hemoglobin: 13 g/dL (ref 12.0–15.0)
Immature Granulocytes: 0 %
Lymphocytes Relative: 11 %
Lymphs Abs: 0.7 10*3/uL (ref 0.7–4.0)
MCH: 31.7 pg (ref 26.0–34.0)
MCHC: 31.8 g/dL (ref 30.0–36.0)
MCV: 99.8 fL (ref 80.0–100.0)
Monocytes Absolute: 0.5 10*3/uL (ref 0.1–1.0)
Monocytes Relative: 7 %
Neutro Abs: 5 10*3/uL (ref 1.7–7.7)
Neutrophils Relative %: 78 %
Platelets: 199 10*3/uL (ref 150–400)
RBC: 4.1 MIL/uL (ref 3.87–5.11)
RDW: 15.2 % (ref 11.5–15.5)
WBC: 6.5 10*3/uL (ref 4.0–10.5)
nRBC: 0 % (ref 0.0–0.2)

## 2022-10-02 LAB — LACTIC ACID, PLASMA
Lactic Acid, Venous: 1.1 mmol/L (ref 0.5–1.9)
Lactic Acid, Venous: 2 mmol/L (ref 0.5–1.9)

## 2022-10-02 MED ORDER — IOHEXOL 350 MG/ML SOLN
60.0000 mL | Freq: Once | INTRAVENOUS | Status: AC | PRN
  Administered 2022-10-02: 60 mL via INTRAVENOUS

## 2022-10-02 NOTE — ED Provider Notes (Signed)
[place] EMERGENCY DEPARTMENT AT [facility] Provider Note   CSN: [id number] Arrival date & time: 10/02/22  1931     History  Chief Complaint  Patient presents with   Altered Mental Status    Pt brought here by EMS due to AMS. According to the daughter, pt has been less alert today and weak. Pt is typical A&Ox4 but currently can't answer all orientation questions. Pt was recently seen here for weakness and was diagnosed with DVT. BSG 153.     [name] is a 88 y.o. female.   Altered Mental Status Patient presents with altered mental status.  Reportedly more confused and cannot answer all question.  Patient states she feels just weak all over.  No dysuria.  Had recently been seen for pain in the left knee but states that is actually feeling somewhat better.  Did have DVT chronically on right leg and discontinued on Plavix at that time.    Past Medical History:  Diagnosis Date   Allergy    Arthritis    Colon cancer (HCC)    colon ca dx 07/30/09   History of cardiac monitoring 07/2017   "Event monitor demonstrated sinus rhythm with isolated PACs and no arrhythmias"   History of colon cancer 06/2009   found at time of TCS 06/29/09, 1.2cm sessile cecal polyp, no adjuvent therapy needed   HTN (hypertension)    Hx of cardiovascular stress test 07/2017   "No diagnostic ST segment changes to indicate ischemia. Small, moderate intensity, reversible apical to basal inferolateral defect consistent with ischemia. This is a low risk study. Nuclear stress EF: 84%."   Hyperlipidemia    Hypothyroidism    PE (pulmonary thromboembolism) (HCC)    Renal disorder    cyst on kidney    Silent micro-hemorrhage of brain (HCC) 11/25/2018   Stroke (HCC)    TIA (transient ischemic attack) 11/25/2018   Vertigo     Home Medications Prior to Admission medications   Medication Sig Start Date End Date Taking? Authorizing Provider  acetaminophen (TYLENOL) 500 MG tablet Take 500 mg  by mouth every 8 (eight) hours as needed for mild pain or headache.    [provider]  ascorbic acid (VITAMIN C) 250 MG CHEW Chew 250 mg by mouth daily.    [provider]  ASPERCREME LIDOCAINE EX Apply 1 application topically daily as needed (for knee pain).     [provider]  cholecalciferol (VITAMIN D3) 25 MCG (1000 UT) tablet Take 1,000 Units by mouth daily.     [provider]  clopidogrel (PLAVIX) 75 MG tablet TAKE ONE TABLET BY MOUTH ONCE DAILY. 06/27/22   Antoine Poche, MD  diltiazem (CARDIZEM CD) 180 MG 24 hr capsule Take 1 capsule (180 mg total) by mouth daily. 09/22/22   Antoine Poche, MD  diltiazem (CARDIZEM) 30 MG tablet Take 30 mg by mouth every 8 (eight) hours as needed (Palpitations).    [provider]  ezetimibe (ZETIA) 10 MG tablet Take 1 tablet (10 mg total) by mouth daily. 04/05/22 03/31/23  Antoine Poche, MD  gabapentin (NEURONTIN) 300 MG capsule Take 300 mg by mouth 3 (three) times daily.    [provider]  levETIRAcetam (KEPPRA) 500 MG tablet Take 250 mg by mouth 2 (two) times daily.    [provider]  Lifitegrast Benay Spice) 5 % SOLN Apply 1 drop to eye daily as needed (dry eyes).    [provider]  Karlene Einstein  72 MCG capsule TAKE (1) CAPSULE BY MOUTH DAILY AS NEEDED FOR CONSTIPATION. 09/19/22   Anabel Halon, MD  loratadine (CLARITIN) 10 MG tablet Take 10 mg by mouth daily.     [provider]  meclizine (ANTIVERT) 25 MG tablet Take 1 tablet (25 mg total) by mouth 3 (three) times daily as needed for dizziness. 11/03/20   Geoffery Lyons, MD  metoprolol succinate (TOPROL-XL) 100 MG 24 hr tablet TAKE 1 TABLET BY MOUTH INTHE MORNING AND AT BEDTIME. TAKE WITH OR IMMEDIATELY FOLLOWING A MEAL. 06/27/22   Antoine Poche, MD  predniSONE (DELTASONE) 20 MG tablet Take 1 tablet (20 mg total) by mouth daily with breakfast. 09/13/22   Anabel Halon, MD  rosuvastatin (CRESTOR) 40 MG tablet TAKE ONE  TABLET BY MOUTH ONCE DAILY. 09/12/22   Antoine Poche, MD  topiramate (TOPAMAX) 50 MG tablet Take 50 mg by mouth 2 (two) times daily.    [provider]  traMADol (ULTRAM) 50 MG tablet Take 25 mg by mouth every 8 (eight) hours as needed for moderate pain. 01/10/19   [provider]  traMADol (ULTRAM) 50 MG tablet Take 0.5-1 tablets (25-50 mg total) by mouth every 12 (twelve) hours as needed for severe pain. 09/30/22   Burgess Amor, PA-C      Allergies    Tape    Review of Systems   Review of Systems  Physical Exam Updated Vital Signs BP (!) 178/79   Pulse 80   Temp (!) 101.7 F (38.7 C) (Rectal)   Resp 20   SpO2 95%  Physical Exam Vitals and nursing note reviewed.  Eyes:     Pupils: Pupils are equal, round, and reactive to light.  Cardiovascular:     Rate and Rhythm: Regular rhythm.  Pulmonary:     Breath sounds: No wheezing.  Abdominal:     Tenderness: There is no abdominal tenderness.  Musculoskeletal:        General: No tenderness.  Neurological:     Mental Status: She is alert and oriented to person, place, and time.     ED Results / Procedures / Treatments   Labs (all labs ordered are listed, but only abnormal results are displayed) Labs Reviewed  COMPREHENSIVE METABOLIC PANEL - Abnormal; Notable for the following components:      Result Value   Glucose, Bld 107 (*)    Creatinine, Ser 1.64 (*)    Calcium 8.6 (*)    ALT 54 (*)    GFR, Estimated 30 (*)    All other components within normal limits  LACTIC ACID, PLASMA - Abnormal; Notable for the following components:   Lactic Acid, Venous 2.0 (*)    All other components within normal limits  CULTURE, BLOOD (ROUTINE X 2)  CULTURE, BLOOD (ROUTINE X 2)  RESP PANEL BY RT-PCR (RSV, FLU A&B, COVID)  RVPGX2  CBC WITH DIFFERENTIAL/PLATELET  LACTIC ACID, PLASMA  URINALYSIS, W/ REFLEX TO CULTURE (INFECTION SUSPECTED)    EKG EKG Interpretation Date/Time:  Sunday October 02 2022 20:14:17  EDT Ventricular Rate:  79 PR Interval:  185 QRS Duration:  94 QT Interval:  394 QTC Calculation: 452 R Axis:   -41  Text Interpretation: Sinus rhythm Left atrial enlargement Left axis deviation Low voltage, precordial leads Abnormal R-wave progression, early transition Borderline T wave abnormalities Confirmed by Benjiman Core [id number]) on 10/02/2022 11:17:21 PM  Radiology CT Angio Chest PE W and/or Wo Contrast  Result Date: 10/02/2022 CLINICAL DATA:  Left leg  pain and possible sepsis, initial encounter EXAM: CT ANGIOGRAPHY CHEST CT ABDOMEN AND PELVIS WITH CONTRAST TECHNIQUE: Multidetector CT imaging of the chest was performed using the standard protocol during bolus administration of intravenous contrast. Multiplanar CT image reconstructions and MIPs were obtained to evaluate the vascular anatomy. Multidetector CT imaging of the abdomen and pelvis was performed using the standard protocol during bolus administration of intravenous contrast. RADIATION DOSE REDUCTION: This exam was performed according to the departmental dose-optimization program which includes automated exposure control, adjustment of the mA and/or kV according to patient size and/or use of iterative reconstruction technique. CONTRAST:  60mL OMNIPAQUE IOHEXOL 350 MG/ML SOLN COMPARISON:  Chest x-ray from earlier in the same day. FINDINGS: CTA CHEST FINDINGS Cardiovascular: Thoracic aorta shows mild atherosclerotic calcifications. No aneurysmal dilatation or dissection is noted. The heart is mildly enlarged in size. Coronary calcifications are noted. The pulmonary artery shows a normal branching pattern bilaterally. Filling defects are identified within the pulmonary artery bilaterally consistent with pulmonary emboli. RV/LV ratio is elevated at 1.25 indicating right heart strain. Mediastinum/Nodes: Thoracic inlet shows significant enlargement of the left lobe of the thyroid consistent with thyroid goiter. This is stable in appearance  from a prior exam from 2023. This has been previously biopsied and does not require follow-up examination. No hilar or mediastinal adenopathy is noted. The esophagus is within normal limits. Lungs/Pleura: Lungs are well aerated bilaterally. In the posterior right upper lobe, there is a nodular density which measures 14 mm in greatest dimension and demonstrates some retraction along the major fissure. No other nodule is identified. No effusion is seen. Musculoskeletal: Mild degenerative changes of the thoracic spine are noted. Review of the MIP images confirms the above findings. CT ABDOMEN and PELVIS FINDINGS Hepatobiliary: Gallbladder is well distended with multiple small dependent gallstones. Liver demonstrates a single cyst in the right lobe stable from the prior exam. Pancreas: Unremarkable. No pancreatic ductal dilatation or surrounding inflammatory changes. Spleen: Normal in size without focal abnormality. Adrenals/Urinary Tract: Adrenal glands are within normal limits. Kidneys show multiple renal cysts stable from the prior exam. No further follow-up is recommended. No renal calculi or obstructive changes are seen. Normal excretion is noted on delayed images. The bladder is within normal limits. Stomach/Bowel: Scattered fecal material is noted throughout the colon. No obstructive changes are noted. Changes consistent with prior right partial colectomy are seen. No obstructive changes are noted. Small bowel is within normal limits. Stomach is unremarkable. Vascular/Lymphatic: Aortic atherosclerosis. No enlarged abdominal or pelvic lymph nodes. Reproductive: Status post hysterectomy. No adnexal masses. Other: No abdominal wall hernia or abnormality. No abdominopelvic ascites. Musculoskeletal: No acute or significant osseous findings. Review of the MIP images confirms the above findings. IMPRESSION: CTA of the chest: Positive for acute PE with CT evidence of right heart strain (RV/LV Ratio = 1.25) consistent  with at least submassive (intermediate risk) PE. The presence of right heart strain has been associated with an increased risk of morbidity and mortality. Please refer to the "Code PE Focused" order set in EPIC. New right solid pulmonary nodule within the upper lobe measuring 14 mm. Per Fleischner Society Guidelines, consider a non-contrast Chest CT at 3 months, a PET/CT, or tissue sampling. These guidelines do not apply to immunocompromised patients and patients with cancer. Follow up in patients with significant comorbidities as clinically warranted. For lung cancer screening, adhere to Lung-RADS guidelines. Reference: Radiology. 2017; 284(1):228-43. Stable left thyroid goiter. No follow-up is recommended as this has been previously biopsied. CT of  the abdomen and pelvis: Cholelithiasis without complicating factors. No acute abnormality noted. Critical Value/emergent results were called by telephone at the time of interpretation on 10/02/2022 at 10:51 pm to Dr. Benjiman Core , who verbally acknowledged these results. Electronically Signed   By: Alcide Clever M.D.   On: 10/02/2022 22:53   CT ABDOMEN PELVIS W CONTRAST  Result Date: 10/02/2022 CLINICAL DATA:  Left leg pain and possible sepsis, initial encounter EXAM: CT ANGIOGRAPHY CHEST CT ABDOMEN AND PELVIS WITH CONTRAST TECHNIQUE: Multidetector CT imaging of the chest was performed using the standard protocol during bolus administration of intravenous contrast. Multiplanar CT image reconstructions and MIPs were obtained to evaluate the vascular anatomy. Multidetector CT imaging of the abdomen and pelvis was performed using the standard protocol during bolus administration of intravenous contrast. RADIATION DOSE REDUCTION: This exam was performed according to the departmental dose-optimization program which includes automated exposure control, adjustment of the mA and/or kV according to patient size and/or use of iterative reconstruction technique. CONTRAST:   60mL OMNIPAQUE IOHEXOL 350 MG/ML SOLN COMPARISON:  Chest x-ray from earlier in the same day. FINDINGS: CTA CHEST FINDINGS Cardiovascular: Thoracic aorta shows mild atherosclerotic calcifications. No aneurysmal dilatation or dissection is noted. The heart is mildly enlarged in size. Coronary calcifications are noted. The pulmonary artery shows a normal branching pattern bilaterally. Filling defects are identified within the pulmonary artery bilaterally consistent with pulmonary emboli. RV/LV ratio is elevated at 1.25 indicating right heart strain. Mediastinum/Nodes: Thoracic inlet shows significant enlargement of the left lobe of the thyroid consistent with thyroid goiter. This is stable in appearance from a prior exam from 2023. This has been previously biopsied and does not require follow-up examination. No hilar or mediastinal adenopathy is noted. The esophagus is within normal limits. Lungs/Pleura: Lungs are well aerated bilaterally. In the posterior right upper lobe, there is a nodular density which measures 14 mm in greatest dimension and demonstrates some retraction along the major fissure. No other nodule is identified. No effusion is seen. Musculoskeletal: Mild degenerative changes of the thoracic spine are noted. Review of the MIP images confirms the above findings. CT ABDOMEN and PELVIS FINDINGS Hepatobiliary: Gallbladder is well distended with multiple small dependent gallstones. Liver demonstrates a single cyst in the right lobe stable from the prior exam. Pancreas: Unremarkable. No pancreatic ductal dilatation or surrounding inflammatory changes. Spleen: Normal in size without focal abnormality. Adrenals/Urinary Tract: Adrenal glands are within normal limits. Kidneys show multiple renal cysts stable from the prior exam. No further follow-up is recommended. No renal calculi or obstructive changes are seen. Normal excretion is noted on delayed images. The bladder is within normal limits. Stomach/Bowel:  Scattered fecal material is noted throughout the colon. No obstructive changes are noted. Changes consistent with prior right partial colectomy are seen. No obstructive changes are noted. Small bowel is within normal limits. Stomach is unremarkable. Vascular/Lymphatic: Aortic atherosclerosis. No enlarged abdominal or pelvic lymph nodes. Reproductive: Status post hysterectomy. No adnexal masses. Other: No abdominal wall hernia or abnormality. No abdominopelvic ascites. Musculoskeletal: No acute or significant osseous findings. Review of the MIP images confirms the above findings. IMPRESSION: CTA of the chest: Positive for acute PE with CT evidence of right heart strain (RV/LV Ratio = 1.25) consistent with at least submassive (intermediate risk) PE. The presence of right heart strain has been associated with an increased risk of morbidity and mortality. Please refer to the "Code PE Focused" order set in EPIC. New right solid pulmonary nodule within the upper lobe measuring  14 mm. Per Fleischner Society Guidelines, consider a non-contrast Chest CT at 3 months, a PET/CT, or tissue sampling. These guidelines do not apply to immunocompromised patients and patients with cancer. Follow up in patients with significant comorbidities as clinically warranted. For lung cancer screening, adhere to Lung-RADS guidelines. Reference: Radiology. 2017; 284(1):228-43. Stable left thyroid goiter. No follow-up is recommended as this has been previously biopsied. CT of the abdomen and pelvis: Cholelithiasis without complicating factors. No acute abnormality noted. Critical Value/emergent results were called by telephone at the time of interpretation on 10/02/2022 at 10:51 pm to Dr. Benjiman Core , who verbally acknowledged these results. Electronically Signed   By: Alcide Clever M.D.   On: 10/02/2022 22:53   DG Chest Portable 1 View  Result Date: 10/02/2022 CLINICAL DATA:  Shortness of breath EXAM: PORTABLE CHEST 1 VIEW COMPARISON:   Chest x-ray 02/12/2022 FINDINGS: The heart size and mediastinal contours are within normal limits. Both lungs are clear. The visualized skeletal structures are unremarkable. IMPRESSION: No active disease. Electronically Signed   By: Darliss Cheney M.D.   On: 10/02/2022 20:38    Procedures Procedures    Medications Ordered in ED Medications  iohexol (OMNIPAQUE) 350 MG/ML injection 60 mL (60 mLs Intravenous Contrast Given 10/02/22 2225)    ED Course/ Medical Decision Making/ A&P                             Medical Decision Making Amount and/or Complexity of Data Reviewed Labs: ordered. Radiology: ordered.  Risk Prescription drug management.   Awake and appropriate.  Answers questions.  Fever.  No definite source but has been coughing.  X-ray reassuring without pneumonia.  Will get basic blood work.  Will get urine sample.  Will discuss with patient's daughter.  Reviewed recent ER note.   Lactic acid slightly elevated on recheck.  CBC normal.  CMP does show some mild worsening of chronic kidney disease.  Creatinine has been 1.41.1 now up to 1.6.  Urinalysis does not show infection.  Chest ray does not show infection.  With fever CT scan done to evaluate for causes such as pulmonary embolism with recent DVT and intra-abdominal pathology.  Discussed with radiologist and does have pulmonary embolisms.  May have some chronic PEs but this is no distribution from previous scan.  Not on anticoagulation due to known previous PEs because of microhemorrhages in her brain.  Is on Plavix.  Does have potential right heart strain.  Will require admission to the hospital.  Will discuss with hospitalist.  Also has lung nodule that is likely malignant.  Will need follow-up.  Also Cholylithiasis without cholecystitis.  CRITICAL CARE Performed by: Benjiman Core Total critical care time: 30 minutes Critical care time was exclusive of separately billable procedures and treating other  patients. Critical care was necessary to treat or prevent imminent or life-threatening deterioration. Critical care was time spent personally by me on the following activities: development of treatment plan with patient and/or surrogate as well as nursing, discussions with consultants, evaluation of patient's response to treatment, examination of patient, obtaining history from patient or surrogate, ordering and performing treatments and interventions, ordering and review of laboratory studies, ordering and review of radiographic studies, pulse oximetry and re-evaluation of patient's condition.  Discussed with Dr.Zierle-Ghosh.  She requested that I consult pulmonary to see if the patient needed to go to [facility] and can be anticoagulated.  I discussed with Dr. Gaynell Face from pulmonary.  She thinks anticoagulation for now with heparin and neurology can help figure out long-term anticoagulation.  Does not need to go to [facility].  Messaged hospitalist again and requested that consult be repaged.          Final Clinical Impression(s) / ED Diagnoses Final diagnoses:  Acute pulmonary embolism with acute cor pulmonale, unspecified pulmonary embolism type [facility])    Rx / DC Orders ED Discharge Orders     None         Benjiman Core, MD 10/03/22 0004

## 2022-10-03 ENCOUNTER — Encounter (HOSPITAL_COMMUNITY): Payer: Self-pay | Admitting: Family Medicine

## 2022-10-03 ENCOUNTER — Other Ambulatory Visit: Payer: Self-pay

## 2022-10-03 ENCOUNTER — Inpatient Hospital Stay (HOSPITAL_COMMUNITY): Payer: Medicare Other

## 2022-10-03 DIAGNOSIS — Z7902 Long term (current) use of antithrombotics/antiplatelets: Secondary | ICD-10-CM | POA: Diagnosis not present

## 2022-10-03 DIAGNOSIS — I2699 Other pulmonary embolism without acute cor pulmonale: Secondary | ICD-10-CM | POA: Diagnosis present

## 2022-10-03 DIAGNOSIS — N189 Chronic kidney disease, unspecified: Secondary | ICD-10-CM | POA: Diagnosis present

## 2022-10-03 DIAGNOSIS — I2602 Saddle embolus of pulmonary artery with acute cor pulmonale: Secondary | ICD-10-CM | POA: Diagnosis not present

## 2022-10-03 DIAGNOSIS — Z91048 Other nonmedicinal substance allergy status: Secondary | ICD-10-CM | POA: Diagnosis not present

## 2022-10-03 DIAGNOSIS — Z7189 Other specified counseling: Secondary | ICD-10-CM | POA: Diagnosis not present

## 2022-10-03 DIAGNOSIS — U071 COVID-19: Secondary | ICD-10-CM | POA: Diagnosis not present

## 2022-10-03 DIAGNOSIS — G9341 Metabolic encephalopathy: Secondary | ICD-10-CM | POA: Diagnosis not present

## 2022-10-03 DIAGNOSIS — R42 Dizziness and giddiness: Secondary | ICD-10-CM | POA: Diagnosis not present

## 2022-10-03 DIAGNOSIS — E049 Nontoxic goiter, unspecified: Secondary | ICD-10-CM | POA: Diagnosis present

## 2022-10-03 DIAGNOSIS — N179 Acute kidney failure, unspecified: Secondary | ICD-10-CM | POA: Diagnosis not present

## 2022-10-03 DIAGNOSIS — Z8249 Family history of ischemic heart disease and other diseases of the circulatory system: Secondary | ICD-10-CM | POA: Diagnosis not present

## 2022-10-03 DIAGNOSIS — M170 Bilateral primary osteoarthritis of knee: Secondary | ICD-10-CM | POA: Diagnosis present

## 2022-10-03 DIAGNOSIS — Z86711 Personal history of pulmonary embolism: Secondary | ICD-10-CM | POA: Diagnosis not present

## 2022-10-03 DIAGNOSIS — I82511 Chronic embolism and thrombosis of right femoral vein: Secondary | ICD-10-CM | POA: Diagnosis present

## 2022-10-03 DIAGNOSIS — E890 Postprocedural hypothyroidism: Secondary | ICD-10-CM | POA: Diagnosis present

## 2022-10-03 DIAGNOSIS — Z515 Encounter for palliative care: Secondary | ICD-10-CM | POA: Diagnosis not present

## 2022-10-03 DIAGNOSIS — I10 Essential (primary) hypertension: Secondary | ICD-10-CM | POA: Diagnosis not present

## 2022-10-03 DIAGNOSIS — R911 Solitary pulmonary nodule: Secondary | ICD-10-CM | POA: Diagnosis present

## 2022-10-03 DIAGNOSIS — E782 Mixed hyperlipidemia: Secondary | ICD-10-CM

## 2022-10-03 DIAGNOSIS — R569 Unspecified convulsions: Secondary | ICD-10-CM | POA: Diagnosis present

## 2022-10-03 DIAGNOSIS — Z7901 Long term (current) use of anticoagulants: Secondary | ICD-10-CM | POA: Diagnosis not present

## 2022-10-03 DIAGNOSIS — M79652 Pain in left thigh: Secondary | ICD-10-CM | POA: Diagnosis present

## 2022-10-03 DIAGNOSIS — Z66 Do not resuscitate: Secondary | ICD-10-CM | POA: Diagnosis present

## 2022-10-03 DIAGNOSIS — Z8673 Personal history of transient ischemic attack (TIA), and cerebral infarction without residual deficits: Secondary | ICD-10-CM | POA: Diagnosis not present

## 2022-10-03 DIAGNOSIS — Z90710 Acquired absence of both cervix and uterus: Secondary | ICD-10-CM | POA: Diagnosis not present

## 2022-10-03 DIAGNOSIS — I2609 Other pulmonary embolism with acute cor pulmonale: Secondary | ICD-10-CM | POA: Diagnosis present

## 2022-10-03 DIAGNOSIS — R209 Unspecified disturbances of skin sensation: Secondary | ICD-10-CM | POA: Diagnosis not present

## 2022-10-03 DIAGNOSIS — Z8616 Personal history of COVID-19: Secondary | ICD-10-CM | POA: Diagnosis not present

## 2022-10-03 DIAGNOSIS — Z86010 Personal history of colonic polyps: Secondary | ICD-10-CM | POA: Diagnosis not present

## 2022-10-03 LAB — COMPREHENSIVE METABOLIC PANEL
ALT: 58 U/L — ABNORMAL HIGH (ref 0–44)
AST: 42 U/L — ABNORMAL HIGH (ref 15–41)
Albumin: 4 g/dL (ref 3.5–5.0)
Alkaline Phosphatase: 85 U/L (ref 38–126)
Anion gap: 8 (ref 5–15)
BUN: 19 mg/dL (ref 8–23)
CO2: 22 mmol/L (ref 22–32)
Calcium: 8.8 mg/dL — ABNORMAL LOW (ref 8.9–10.3)
Chloride: 108 mmol/L (ref 98–111)
Creatinine, Ser: 1.41 mg/dL — ABNORMAL HIGH (ref 0.44–1.00)
GFR, Estimated: 37 mL/min — ABNORMAL LOW (ref >60)
Glucose, Bld: 114 mg/dL — ABNORMAL HIGH (ref 70–99)
Potassium: 3.3 mmol/L — ABNORMAL LOW (ref 3.5–5.1)
Sodium: 138 mmol/L (ref 135–145)
Total Bilirubin: 1 mg/dL (ref 0.3–1.2)
Total Protein: 8.1 g/dL (ref 6.5–8.1)

## 2022-10-03 LAB — CBC WITH DIFFERENTIAL/PLATELET
Abs Immature Granulocytes: 0.02 10*3/uL (ref 0.00–0.07)
Basophils Absolute: 0 10*3/uL (ref 0.0–0.1)
Basophils Relative: 0 %
Eosinophils Absolute: 0.1 10*3/uL (ref 0.0–0.5)
Eosinophils Relative: 2 %
HCT: 44.9 % (ref 36.0–46.0)
Hemoglobin: 14.4 g/dL (ref 12.0–15.0)
Immature Granulocytes: 0 %
Lymphocytes Relative: 12 %
Lymphs Abs: 0.7 10*3/uL (ref 0.7–4.0)
MCH: 31.6 pg (ref 26.0–34.0)
MCHC: 32.1 g/dL (ref 30.0–36.0)
MCV: 98.7 fL (ref 80.0–100.0)
Monocytes Absolute: 0.6 10*3/uL (ref 0.1–1.0)
Monocytes Relative: 11 %
Neutro Abs: 4.5 10*3/uL (ref 1.7–7.7)
Neutrophils Relative %: 75 %
Platelets: 203 10*3/uL (ref 150–400)
RBC: 4.55 MIL/uL (ref 3.87–5.11)
RDW: 15.3 % (ref 11.5–15.5)
WBC: 6 10*3/uL (ref 4.0–10.5)
nRBC: 0 % (ref 0.0–0.2)

## 2022-10-03 LAB — CULTURE, BLOOD (ROUTINE X 2)
Culture: NO GROWTH
Special Requests: ADEQUATE

## 2022-10-03 LAB — RESP PANEL BY RT-PCR (RSV, FLU A&B, COVID)  RVPGX2
Influenza A by PCR: NEGATIVE
Influenza B by PCR: NEGATIVE
Resp Syncytial Virus by PCR: NEGATIVE
SARS Coronavirus 2 by RT PCR: POSITIVE — AB

## 2022-10-03 LAB — PROCALCITONIN: Procalcitonin: <0.1 ng/mL

## 2022-10-03 LAB — ECHOCARDIOGRAM COMPLETE
Area-P 1/2: 3.31 cm2
Calc EF: 64.4 %
Height: 64 in
S' Lateral: 2.4 cm
Single Plane A2C EF: 62.6 %
Single Plane A4C EF: 64.4 %
Weight: 2405.66 oz

## 2022-10-03 LAB — HEPARIN LEVEL (UNFRACTIONATED)
Heparin Unfractionated: >1.1 IU/mL — ABNORMAL HIGH (ref 0.30–0.70)
Heparin Unfractionated: >1.1 IU/mL — ABNORMAL HIGH (ref 0.30–0.70)

## 2022-10-03 LAB — MRSA NEXT GEN BY PCR, NASAL: MRSA by PCR Next Gen: NOT DETECTED

## 2022-10-03 LAB — MAGNESIUM: Magnesium: 2.1 mg/dL (ref 1.7–2.4)

## 2022-10-03 LAB — LACTIC ACID, PLASMA: Lactic Acid, Venous: 1.2 mmol/L (ref 0.5–1.9)

## 2022-10-03 MED ORDER — LABETALOL HCL 5 MG/ML IV SOLN
20.0000 mg | Freq: Four times a day (QID) | INTRAVENOUS | Status: DC | PRN
  Administered 2022-10-03 – 2022-10-04 (×2): 20 mg via INTRAVENOUS
  Filled 2022-10-03 (×2): qty 4

## 2022-10-03 MED ORDER — OXYCODONE HCL 5 MG PO TABS: 5.0000 mg | ORAL_TABLET | ORAL | Status: DC | PRN

## 2022-10-03 MED ORDER — METHYLPREDNISOLONE SODIUM SUCC 125 MG IJ SOLR
125.0000 mg | Freq: Every day | INTRAMUSCULAR | Status: DC
  Administered 2022-10-03 – 2022-10-04 (×2): 125 mg via INTRAVENOUS
  Filled 2022-10-03 (×2): qty 2

## 2022-10-03 MED ORDER — HYDRALAZINE HCL 20 MG/ML IJ SOLN
5.0000 mg | Freq: Once | INTRAMUSCULAR | Status: AC
  Administered 2022-10-03: 5 mg via INTRAVENOUS
  Filled 2022-10-03: qty 1

## 2022-10-03 MED ORDER — ACETAMINOPHEN 325 MG PO TABS
650.0000 mg | ORAL_TABLET | Freq: Four times a day (QID) | ORAL | Status: DC | PRN
  Administered 2022-10-03: 650 mg via ORAL
  Filled 2022-10-03: qty 2

## 2022-10-03 MED ORDER — ROSUVASTATIN CALCIUM 20 MG PO TABS
40.0000 mg | ORAL_TABLET | Freq: Every day | ORAL | Status: DC
  Administered 2022-10-03 – 2022-10-06 (×4): 40 mg via ORAL
  Filled 2022-10-03 (×2): qty 2
  Filled 2022-10-03: qty 4
  Filled 2022-10-03: qty 2

## 2022-10-03 MED ORDER — GABAPENTIN 300 MG PO CAPS
300.0000 mg | ORAL_CAPSULE | Freq: Three times a day (TID) | ORAL | Status: DC
  Administered 2022-10-03 – 2022-10-06 (×11): 300 mg via ORAL
  Filled 2022-10-03 (×11): qty 1

## 2022-10-03 MED ORDER — ONDANSETRON HCL 4 MG PO TABS: 4.0000 mg | ORAL_TABLET | Freq: Four times a day (QID) | ORAL | Status: DC | PRN

## 2022-10-03 MED ORDER — HEPARIN (PORCINE) 25000 UT/250ML-% IV SOLN
1050.0000 [IU]/h | INTRAVENOUS | Status: DC
  Administered 2022-10-03: 1050 [IU]/h via INTRAVENOUS
  Filled 2022-10-03 (×2): qty 250

## 2022-10-03 MED ORDER — ACETAMINOPHEN 650 MG RE SUPP: 650.0000 mg | Freq: Four times a day (QID) | RECTAL | Status: DC | PRN

## 2022-10-03 MED ORDER — ONDANSETRON HCL 4 MG/2ML IJ SOLN: 4.0000 mg | Freq: Four times a day (QID) | INTRAMUSCULAR | Status: DC | PRN

## 2022-10-03 MED ORDER — CLOPIDOGREL BISULFATE 75 MG PO TABS
75.0000 mg | ORAL_TABLET | Freq: Every day | ORAL | Status: DC
  Administered 2022-10-03 – 2022-10-06 (×4): 75 mg via ORAL
  Filled 2022-10-03 (×4): qty 1

## 2022-10-03 MED ORDER — DILTIAZEM HCL 30 MG PO TABS: 30.0000 mg | ORAL_TABLET | Freq: Three times a day (TID) | ORAL | Status: DC | PRN

## 2022-10-03 MED ORDER — CHLORHEXIDINE GLUCONATE CLOTH 2 % EX PADS
6.0000 | MEDICATED_PAD | Freq: Every day | CUTANEOUS | Status: DC
  Administered 2022-10-03 – 2022-10-06 (×5): 6 via TOPICAL

## 2022-10-03 MED ORDER — HEPARIN BOLUS VIA INFUSION
5400.0000 [IU] | Freq: Once | INTRAVENOUS | Status: AC
  Administered 2022-10-03: 5400 [IU] via INTRAVENOUS

## 2022-10-03 MED ORDER — HEPARIN (PORCINE) 25000 UT/250ML-% IV SOLN: 900.0000 [IU]/h | INTRAVENOUS | Status: DC

## 2022-10-03 MED ORDER — DILTIAZEM HCL ER COATED BEADS 180 MG PO CP24
180.0000 mg | ORAL_CAPSULE | Freq: Every day | ORAL | Status: DC
  Administered 2022-10-03 – 2022-10-06 (×4): 180 mg via ORAL
  Filled 2022-10-03 (×4): qty 1

## 2022-10-03 MED ORDER — HEPARIN (PORCINE) 25000 UT/250ML-% IV SOLN
700.0000 [IU]/h | INTRAVENOUS | Status: DC
  Administered 2022-10-03: 700 [IU]/h via INTRAVENOUS

## 2022-10-03 MED ORDER — HYDRALAZINE HCL 20 MG/ML IJ SOLN
10.0000 mg | Freq: Three times a day (TID) | INTRAMUSCULAR | Status: DC | PRN
  Administered 2022-10-03: 10 mg via INTRAVENOUS
  Filled 2022-10-03: qty 1

## 2022-10-03 MED ORDER — EZETIMIBE 10 MG PO TABS
10.0000 mg | ORAL_TABLET | Freq: Every day | ORAL | Status: DC
  Administered 2022-10-03 – 2022-10-06 (×4): 10 mg via ORAL
  Filled 2022-10-03 (×4): qty 1

## 2022-10-03 MED ORDER — METOPROLOL SUCCINATE ER 50 MG PO TB24
100.0000 mg | ORAL_TABLET | Freq: Every day | ORAL | Status: DC
  Administered 2022-10-03 – 2022-10-06 (×4): 100 mg via ORAL
  Filled 2022-10-03 (×4): qty 2

## 2022-10-03 MED ORDER — TOPIRAMATE 25 MG PO TABS
50.0000 mg | ORAL_TABLET | Freq: Two times a day (BID) | ORAL | Status: DC
  Administered 2022-10-03 – 2022-10-06 (×7): 50 mg via ORAL
  Filled 2022-10-03 (×8): qty 2

## 2022-10-03 MED ORDER — LEVETIRACETAM 250 MG PO TABS
250.0000 mg | ORAL_TABLET | Freq: Two times a day (BID) | ORAL | Status: DC
  Administered 2022-10-03 – 2022-10-06 (×7): 250 mg via ORAL
  Filled 2022-10-03 (×8): qty 1

## 2022-10-03 NOTE — Assessment & Plan Note (Addendum)
-   COVID-positive - With fever - Patient is requiring oxygen supplementation - Start steroids - Continue to monitor

## 2022-10-03 NOTE — Progress Notes (Signed)
ANTICOAGULATION CONSULT NOTE - Follow Up Consult  Pharmacy Consult for heparin Indication: pulmonary embolus  Allergies  Allergen Reactions   Tape Rash    Zio monitor adhesive causes ulcerated and infected skin .Had to see derm    Patient Measurements:   Heparin Dosing Weight: 68.7 kg  Vital Signs: Temp: 101.7 F (38.7 C) (07/21 2029) Temp Source: Rectal (07/21 2029) BP: 178/79 (07/21 2200) Pulse Rate: 80 (07/21 2200)  Labs: Recent Labs    10/02/22 2000  HGB 13.0  HCT 40.9  PLT 199  CREATININE 1.64*    Estimated Creatinine Clearance: 24 mL/min (A) (by C-G formula based on SCr of 1.64 mg/dL (H)).  Assessment: 32 yoF presented to ED with knee pain with DVT US showing chronic DVT in opposite leg of pain and CTA positive for acute submassive PE with evidence of right heart strain. Pharmacy consulted to dose heparin for PE. CBC stable and no PTA anticoagulation.  Goal of Therapy:  Heparin level 0.3-0.7 units/ml Monitor platelets by anticoagulation protocol: Yes   Plan:  Give 5400 units bolus x 1 Start heparin infusion at 1050 units/hr Check anti-Xa level in 8 hours and daily while on heparin Continue to monitor H&H and platelets  [name], PharmD. Clinical Pharmacist 10/03/2022 12:23 AM

## 2022-10-03 NOTE — Progress Notes (Signed)
Heparin drip paused per Pharmacy at 1045. Drip to remain paused for one hour.

## 2022-10-03 NOTE — Assessment & Plan Note (Signed)
-   With history of DVTs - Currently not on coagulation in the outpatient setting due to history of bleed - Pulmonary consulted and recommended starting heparin but also recommended neuroconsult due to history of brain bleed - Neuroconsulted - Heparin per pharmacy consult - Monitor on stepdown - Echo in the a.m. as CT scan does show likely right heart strain - Pulmonary advised admission here at [facility]

## 2022-10-03 NOTE — H&P (Addendum)
History and Physical    Patient: [name] NFA:[id number] DOB: 10/18/1934 DOA: 10/02/2022 DOS: the patient was seen and examined on 10/03/2022 PCP: Anabel Halon, MD  Patient coming from: Home  Chief Complaint:  Chief Complaint  Patient presents with   Altered Mental Status    Pt brought here by EMS due to AMS. According to the daughter, pt has been less alert today and weak. Pt is typical A&Ox4 but currently can't answer all orientation questions. Pt was recently seen here for weakness and was diagnosed with DVT. BSG 153.    HPI: [name] is a 88 y.o. female with medical history significant of hypertension, hyperlipidemia, hypothyroidism, DVT/PE, stroke, silent microhemorrhage of brain, and more presents the ED with a chief complaint of altered mental status.  Patient is typically alert and oriented x 4.  She has been subtly confused.  For example, she is not able to say which medication she took today.  She is unsure why she is in the hospital and thinks it has to do with her legs.  She was recently seen for leg pain and suspected DVT in the lower extremities.  When asked to elaborate patient says she is not in any pain today and she does not remember having been in with pain in her legs.  A few days ago she had a ultrasound unilateral left lower extremity that showed a subacute to chronic appearing nonocclusive deep vein thrombosis.  Patient was not on anticoagulation due to history of brain bleed.  She was supposed to have follow-up with her outpatient provider this week.  Today in the ER patient was febrile, tachycardic, and requiring 2 L nasal cannula.  CTA chest was done that showed an acute PE with evidence of right heart strain consistent with submassive PE.  There was a new right solid pulmonary nodule which will need outpatient follow-up.  She has a stable left thyroid goiter.  CT of the abdomen pelvis showed cholelithiasis without complicating factors.  Pulmonary  was consulted and recommended starting heparin drip despite history of brain bleed and then getting a neuroconsult to further sort that out.  Due to patient being febrile in the ER UA was done that was not indicative of UTI.  Blood cultures pending.  Chest x-ray shows no evidence of acute disease.  Patient is COVID-positive.  At the time of my exam, patient is able to say she is not currently in any pain, she has no further complaints.  Patient was made full code as her advance directives give specific scenarios about patient remaining in a vegetative state or having some terminal illness then she would want treatment withdrawn, but she does not specifically have a DNR. Review of Systems: unable to review all systems due to the inability of the patient to answer questions. Past Medical History:  Diagnosis Date   Allergy    Arthritis    Colon cancer (HCC)    colon ca dx 07/30/09   History of cardiac monitoring 07/2017   "Event monitor demonstrated sinus rhythm with isolated PACs and no arrhythmias"   History of colon cancer 06/2009   found at time of TCS 06/29/09, 1.2cm sessile cecal polyp, no adjuvent therapy needed   HTN (hypertension)    Hx of cardiovascular stress test 07/2017   "No diagnostic ST segment changes to indicate ischemia. Small, moderate intensity, reversible apical to basal inferolateral defect consistent with ischemia. This is a low risk study. Nuclear stress EF: 84%."  Hyperlipidemia    Hypothyroidism    PE (pulmonary thromboembolism) (HCC)    Renal disorder    cyst on kidney    Silent micro-hemorrhage of brain (HCC) 11/25/2018   Stroke [facility])    TIA (transient ischemic attack) 11/25/2018   Vertigo    Past Surgical History:  Procedure Laterality Date   ABDOMINAL HYSTERECTOMY     COLON SURGERY  07/2009   right hemicolectomy, no residual colon cancer on path   COLONOSCOPY  07/16/2010   QIH:[id number] POLYP-TCS 3 YEARS   COLONOSCOPY N/A 08/02/2013   hyperplastic polyps,  surveillance in 2020 if benefits outweight the risks   COLONOSCOPY  06/2009   1.2 cm sessile cecal polyp which had adenocarcinoma arising in a tubular adenoma.   PARTIAL THYMECTOMY     partial thyroidectomy     benign tumors   Social History:  reports that she has never smoked. She has never used smokeless tobacco. She reports that she does not drink alcohol and does not use drugs.  Allergies  Allergen Reactions   Tape Rash    Zio monitor adhesive causes ulcerated and infected skin .Had to see derm    Family History  Problem Relation Age of Onset   Colon cancer Mother        >age37   Arthritis Mother    Cancer Mother    Heart disease Mother    Hyperlipidemia Mother    Hypertension Mother    Heart attack Father    Heart disease Father    Diabetes Maternal Aunt    Hyperlipidemia Daughter    Hypertension Daughter    Liver disease Neg Hx     Prior to Admission medications   Medication Sig Start Date End Date Taking? Authorizing Provider  acetaminophen (TYLENOL) 500 MG tablet Take 500 mg by mouth every 8 (eight) hours as needed for mild pain or headache.    [provider]  ascorbic acid (VITAMIN C) 250 MG CHEW Chew 250 mg by mouth daily.    [provider]  ASPERCREME LIDOCAINE EX Apply 1 application topically daily as needed (for knee pain).     [provider]  cholecalciferol (VITAMIN D3) 25 MCG (1000 UT) tablet Take 1,000 Units by mouth daily.     [provider]  clopidogrel (PLAVIX) 75 MG tablet TAKE ONE TABLET BY MOUTH ONCE DAILY. 06/27/22   Antoine Poche, MD  diltiazem (CARDIZEM CD) 180 MG 24 hr capsule Take 1 capsule (180 mg total) by mouth daily. 09/22/22   Antoine Poche, MD  diltiazem (CARDIZEM) 30 MG tablet Take 30 mg by mouth every 8 (eight) hours as needed (Palpitations).    [provider]  ezetimibe (ZETIA) 10 MG tablet Take 1 tablet (10 mg total) by mouth daily. 04/05/22 03/31/23  Antoine Poche, MD   gabapentin (NEURONTIN) 300 MG capsule Take 300 mg by mouth 3 (three) times daily.    [provider]  levETIRAcetam (KEPPRA) 500 MG tablet Take 250 mg by mouth 2 (two) times daily.    [provider]  Lifitegrast Benay Spice) 5 % SOLN Apply 1 drop to eye daily as needed (dry eyes).    [provider]  LINZESS 72 MCG capsule TAKE (1) CAPSULE BY MOUTH DAILY AS NEEDED FOR CONSTIPATION. 09/19/22   Anabel Halon, MD  loratadine (CLARITIN) 10 MG tablet Take 10 mg by mouth daily.     [provider]  meclizine (ANTIVERT) 25 MG tablet Take 1 tablet (25 mg  total) by mouth 3 (three) times daily as needed for dizziness. 11/03/20   Geoffery Lyons, MD  metoprolol succinate (TOPROL-XL) 100 MG 24 hr tablet TAKE 1 TABLET BY MOUTH INTHE MORNING AND AT BEDTIME. TAKE WITH OR IMMEDIATELY FOLLOWING A MEAL. 06/27/22   Antoine Poche, MD  predniSONE (DELTASONE) 20 MG tablet Take 1 tablet (20 mg total) by mouth daily with breakfast. 09/13/22   Anabel Halon, MD  rosuvastatin (CRESTOR) 40 MG tablet TAKE ONE TABLET BY MOUTH ONCE DAILY. 09/12/22   Antoine Poche, MD  topiramate (TOPAMAX) 50 MG tablet Take 50 mg by mouth 2 (two) times daily.    [provider]  traMADol (ULTRAM) 50 MG tablet Take 25 mg by mouth every 8 (eight) hours as needed for moderate pain. 01/10/19   [provider]  traMADol (ULTRAM) 50 MG tablet Take 0.5-1 tablets (25-50 mg total) by mouth every 12 (twelve) hours as needed for severe pain. 09/30/22   Burgess Amor, PA-C    Physical Exam: Vitals:   10/03/22 0232 10/03/22 0300 10/03/22 0330 10/03/22 0400  BP: (!) 198/85 (!) 201/86 (!) 199/83 (!) 162/96  Pulse: 77 77 77 72  Resp: 20 (!) 22 20 19   Temp: (!) 101 F (38.3 C)     TempSrc: Oral     SpO2:  99% 99% 100%  Weight: 68.2 kg     Height: 5\' 4"  (1.626 m)      1.  General: Patient lying supine in bed,  no acute distress   2. Psychiatric: Alert and oriented x 3, mood and behavior normal for  situation, pleasant and cooperative with exam   3. Neurologic: Speech and language are normal, face is symmetric, moves all 4 extremities voluntarily, at baseline without acute deficits on limited exam   4. HEENMT:  Head is atraumatic, normocephalic, pupils reactive to light, neck is supple, trachea is midline, mucous membranes are moist   5. Respiratory : Lungs are clear to auscultation bilaterally without wheezing, rhonchi, rales, no cyanosis, no increase in work of breathing or accessory muscle use   6. Cardiovascular : Heart rate normal, rhythm is regular, murmur present, rubs or gallops, peripheral pulses palpated   7. Gastrointestinal:  Abdomen is soft, nondistended, nontender to palpation bowel sounds active, no masses or organomegaly palpated   8. Skin:  Skin is warm, dry and intact without rashes, acute lesions, or ulcers on limited exam   9.Musculoskeletal:  No acute deformities or trauma, no asymmetry in tone, peripheral pulses palpated, no tenderness to palpation in the extremities  Data Reviewed: In the ED Patient was febrile to 101.7, heart rate 77-80, respiratory rate 18-21, blood pressure 148/79-978/110, satting 95-99% requiring 2 L nasal cannula No leukocytosis, hemoglobin stable Chemistry reveals an elevated creatinine at 1.64 Lactic acid initially 1.1>> 2.0>> repeat pending UA is not negative UTI Blood culture pending CTA chest shows acute PE with right heart strain Chest x-ray shows no active disease COVID-positive Admission requested for acute PE  Assessment and Plan: * Acute pulmonary embolism (HCC) - With history of DVTs - Currently not on coagulation in the outpatient setting due to history of bleed - Pulmonary consulted and recommended starting heparin but also recommended neuroconsult due to history of brain bleed - Neuroconsulted - Heparin per pharmacy consult - Monitor on stepdown - Echo in the a.m. as CT scan does show likely right heart  strain - Pulmonary advised admission here at [facility]  Acute metabolic encephalopathy - Likely related to  COVID - Fever which is secondary to COVID contributing -Alert and oriented x 4 at baseline - Alert, with subtle confusion at admission - Continue treatment per COVID and per PE and anticipate improvement  COVID-19 virus infection - COVID-positive - With fever - Patient is requiring oxygen supplementation - Start steroids - Continue to monitor  Acute kidney injury superimposed on chronic kidney disease (HCC) - Creatinine increased from 1.11>> 1.64 - Hold nephrotoxic agents when possible - Blood pressure in the 200s at admission, control blood pressure - Patient is clinically euvolemic - Trend in the a.m.  Mixed hyperlipidemia - Continue Zetia and Crestor  Essential hypertension - Continue Cardizem and metoprolol - Patient is unsure which of these medication she is taking today      Advance Care Planning:   Code Status: Full Code  Consults: Neuro  Family Communication: No family at bedside  Severity of Illness: The appropriate patient status for this patient is INPATIENT. Inpatient status is judged to be reasonable and necessary in order to provide the required intensity of service to ensure the patient's safety. The patient's presenting symptoms, physical exam findings, and initial radiographic and laboratory data in the context of their chronic comorbidities is felt to place them at high risk for further clinical deterioration. Furthermore, it is not anticipated that the patient will be medically stable for discharge from the hospital within 2 midnights of admission.   * I certify that at the point of admission it is my clinical judgment that the patient will require inpatient hospital care spanning beyond 2 midnights from the point of admission due to high intensity of service, high risk for further deterioration and high frequency of surveillance  required.*  Author: Lilyan Gilford, DO 10/03/2022 4:05 AM  For on call review [email or web address].

## 2022-10-03 NOTE — Progress Notes (Addendum)
0210 patient arrived from ER on room air on heparin alert x2. Incontinent with brief on, Patient hypertensive take home BP medications  0300 PRN medication for BP  0330 Tylenol given for temp

## 2022-10-03 NOTE — Assessment & Plan Note (Signed)
-   Creatinine increased from 1.11>> 1.64 - Hold nephrotoxic agents when possible - Blood pressure in the 200s at admission, control blood pressure - Patient is clinically euvolemic - Trend in the a.m.

## 2022-10-03 NOTE — Plan of Care (Signed)
  Problem: Education: Goal: Knowledge of General Education information will improve Description: Including pain rating scale, medication(s)/side effects and non-pharmacologic comfort measures Outcome: Not Progressing   Problem: Health Behavior/Discharge Planning: Goal: Ability to manage health-related needs will improve Outcome: Not Progressing   Problem: Clinical Measurements: Goal: Ability to maintain clinical measurements within normal limits will improve Outcome: Not Progressing Goal: Will remain free from infection Outcome: Not Progressing Goal: Diagnostic test results will improve Outcome: Not Progressing Goal: Respiratory complications will improve Outcome: Not Progressing Goal: Cardiovascular complication will be avoided Outcome: Not Progressing   Problem: Activity: Goal: Risk for activity intolerance will decrease Outcome: Not Progressing   Problem: Nutrition: Goal: Adequate nutrition will be maintained Outcome: Not Progressing   Problem: Coping: Goal: Level of anxiety will decrease Outcome: Not Progressing   Problem: Elimination: Goal: Will not experience complications related to bowel motility Outcome: Not Progressing Goal: Will not experience complications related to urinary retention Outcome: Not Progressing   Problem: Pain Managment: Goal: General experience of comfort will improve Outcome: Not Progressing   Problem: Safety: Goal: Ability to remain free from injury will improve Outcome: Not Progressing   Problem: Skin Integrity: Goal: Risk for impaired skin integrity will decrease Outcome: Not Progressing   Problem: Education: Goal: Knowledge of risk factors and measures for prevention of condition will improve Outcome: Not Progressing   Problem: Coping: Goal: Psychosocial and spiritual needs will be supported Outcome: Not Progressing   Problem: Respiratory: Goal: Will maintain a patent airway Outcome: Not Progressing Goal: Complications  related to the disease process, condition or treatment will be avoided or minimized Outcome: Not Progressing  Patient incontinent confused and weak has DVT and PE

## 2022-10-03 NOTE — Progress Notes (Signed)
  Echocardiogram 2D Echocardiogram has been performed.  [name] 10/03/2022, 9:41 AM

## 2022-10-03 NOTE — TOC Initial Note (Signed)
Transition of Care [facility]) - Initial/Assessment Note    Patient Details  Name: [name] MRN: [id number] Date of Birth: 08-29-1934  [facility]) CM/SW Contact:    Karn Cassis, LCSW Phone Number: 10/03/2022, 8:52 AM  Clinical Narrative: Pt admitted for acute pulmonary embolism. Assessment completed due to high risk readmission score. LCSW completed assessment with pt's daughter as pt oriented x2 per chart. Pt lives alone. Her daughter stays with her on weekends and is in and out throughout the week. She is independent with ADLs. Pt had home health several months ago with [facility]. Daughter takes pt to appointments. She plans on her returning home when medically stable. TOC will continue to follow and address d/c planning needs.                   Expected Discharge Plan: Home/Self Care Barriers to Discharge: Continued Medical Work up   Patient Goals and CMS Choice Patient states their goals for this hospitalization and ongoing recovery are:: return home   Choice offered to / list presented to : Adult Children [place] ownership interest in [facility].provided to::  (n/a)    Expected Discharge Plan and Services In-house Referral: Clinical Social Work     Living arrangements for the past 2 months: Single Family Home                                      Prior Living Arrangements/Services Living arrangements for the past 2 months: Single Family Home Lives with:: Self Patient language and need for interpreter reviewed:: Yes Do you feel safe going back to the place where you live?: Yes      Need for Family Participation in Patient Care: Yes (Comment)   Current home services: DME (cane, rollator, BSC) Criminal Activity/Legal Involvement Pertinent to Current Situation/Hospitalization: No - Comment as needed  Activities of Daily Living Home Assistive Devices/Equipment: None ADL Screening (condition at time of admission) Patient's  cognitive ability adequate to safely complete daily activities?: No Is the patient deaf or have difficulty hearing?: No Does the patient have difficulty seeing, even when wearing glasses/contacts?: No Does the patient have difficulty concentrating, remembering, or making decisions?: Yes Patient able to express need for assistance with ADLs?: No Does the patient have difficulty dressing or bathing?: Yes Independently performs ADLs?: Yes (appropriate for developmental age) Does the patient have difficulty walking or climbing stairs?: Yes Weakness of Legs: Both Weakness of Arms/Hands: Both  Permission Sought/Granted                  Emotional Assessment     Affect (typically observed): Unable to Assess Orientation: : Oriented to Self, Oriented to Place Alcohol / Substance Use: Not Applicable Psych Involvement: No (comment)  Admission diagnosis:  Acute pulmonary embolism (HCC) [I26.99] Acute pulmonary embolism with acute cor pulmonale, unspecified pulmonary embolism type (HCC) [I26.09] Patient Active Problem List   Diagnosis Date Noted   Acute pulmonary embolism (HCC) 10/03/2022   Acute kidney injury superimposed on chronic kidney disease (HCC) 10/03/2022   COVID-19 virus infection 10/03/2022   Acute metabolic encephalopathy 10/03/2022   Baker cyst, left 09/13/2022   Dizziness 07/21/2022   Age-related cognitive decline 07/21/2022   Sensory disturbance 08/03/2021   Impaired glucose tolerance 08/03/2021   Chronic left shoulder pain 07/23/2021   Hypokalemia 01/08/2021   TIA (transient ischemic attack) 01/06/2021   Encounter for  general adult medical examination with abnormal findings 12/15/2020   Leg swelling 11/09/2020   Headache 08/12/2020   Prediabetes 03/13/2019   Complex partial seizure (HCC) 03/02/2019   Left hemiparesis (HCC) 03/01/2019   PVC (premature ventricular contraction) 11/16/2018   DDD (degenerative disc disease), cervical 08/16/2018   Mixed hyperlipidemia  08/16/2018   Insomnia 12/15/2015   Essential hypertension 11/02/2015   Hypothyroidism 11/02/2015   Chronic kidney disease, stage 3b (HCC) 11/02/2015   OA (osteoarthritis) of knee 11/02/2015   Colon cancer (HCC) 02/28/2011   History of colon cancer 06/29/2010   Constipation 06/10/2009   PCP:  Anabel Halon, MD Pharmacy:   [facility] [place], [place] - [address] [place] [address] Phone: [phone] Fax: [phone]     Social Determinants of Health (SDOH) Social History: SDOH Screenings   Food Insecurity: No Food Insecurity (10/03/2022)  Housing: Low Risk  (10/03/2022)  Transportation Needs: No Transportation Needs (10/03/2022)  Utilities: Not At Risk (10/03/2022)  Alcohol Screen: Low Risk  (11/13/2020)  Depression (PHQ2-9): Low Risk  (09/13/2022)  Financial Resource Strain: Low Risk  (11/13/2020)  Physical Activity: Insufficiently Active (11/22/2021)  Social Connections: Moderately Isolated (11/13/2020)  Stress: No Stress Concern Present (11/13/2020)  Tobacco Use: Low Risk  (10/03/2022)   SDOH Interventions:     Readmission Risk Interventions    10/03/2022    8:49 AM  Readmission Risk Prevention Plan  Transportation Screening Complete  Home Care Screening Complete  Medication Review (RN CM) Complete

## 2022-10-03 NOTE — Progress Notes (Signed)
ANTICOAGULATION CONSULT NOTE -  Pharmacy Consult for heparin Indication: pulmonary embolus  Allergies  Allergen Reactions   Tape Rash    Zio monitor adhesive causes ulcerated and infected skin .Had to see derm    Patient Measurements: Height: 5\' 4"  (162.6 cm) Weight: 68.2 kg (150 lb 5.7 oz) IBW/kg (Calculated) : 54.7 Heparin Dosing Weight: 68.7 kg  Vital Signs: Temp: 98.4 F (36.9 C) (07/22 0730) Temp Source: Oral (07/22 0730) BP: 175/72 (07/22 0800) Pulse Rate: 66 (07/22 0800)  Labs: Recent Labs    10/02/22 2000 10/03/22 0306 10/03/22 0914  HGB 13.0 14.4  --   HCT 40.9 44.9  --   PLT 199 203  --   HEPARINUNFRC  --   --  >1.10*  CREATININE 1.64* 1.41*  --     Estimated Creatinine Clearance: 27.7 mL/min (A) (by C-G formula based on SCr of 1.41 mg/dL (H)).  Assessment: 53 yoF presented to ED with knee pain with DVT US showing chronic DVT in opposite leg of pain and CTA positive for acute submassive PE with evidence of right heart strain( RV/LV ration= 1.25). Pharmacy consulted to dose heparin for PE. CBC stable and no PTA anticoagulation.  HL >1.1, supratherapeutic    Goal of Therapy:  Heparin level 0.3-0.7 units/ml Monitor platelets by anticoagulation protocol: Yes   Plan:  Hold heparin infusion for 1 hour, then restart  Decrease heparin infusion to 900 units/hr Check anti-Xa level in 8 hours and daily while on heparin Continue to monitor H&H and platelets  Elder Cyphers, BS Pharm D, BCPS Clinical Pharmacist 10/03/2022 10:33 AM

## 2022-10-03 NOTE — Assessment & Plan Note (Signed)
-   Continue Zetia and Crestor -Heart healthy diet discussed with patient.

## 2022-10-03 NOTE — Assessment & Plan Note (Signed)
-  Continue current antihypertensive regimen -Continue as needed labetalol and hydralazine -Follow-up vital signs -Heart healthy diet discussed with patient.

## 2022-10-03 NOTE — Progress Notes (Signed)
Patient seen and examined; admitted after midnight secondary to altered mental status, fever and shortness of breath.  Workup demonstrating positive pulmonary embolism and positive COVID infection.  Hemodynamically stable currently.  Please refer to H&P written by Dr.[email or web address]-ghosh for further info/details on admission.  Plan: -Continue treatment with heparin and closely follow. -Follow-up 2D echo -Continue supportive care and continue precaution for COVID -Follow electrolytes and replete as needed -Follow clinical response. -Palliative care consulted for goals to care plan of care planning discussion.  Vassie Loll MD [phone]

## 2022-10-03 NOTE — Assessment & Plan Note (Addendum)
-   Present at time of admission probably in the setting of fever, mild decrease oxygen saturation with pulmonary embolism. -Patient mentation is back to normal -No requiring oxygen supplementation and in no acute distress -Continue supportive care.

## 2022-10-03 NOTE — Consult Note (Signed)
ANTICOAGULATION CONSULT NOTE -   Pharmacy Consult for heparin Indication: pulmonary embolus   Allergies       Allergies  Allergen Reactions   Tape Rash      Zio monitor adhesive causes ulcerated and infected skin .Had to see derm        Patient Measurements: Height: 5\' 4"  (162.6 cm) Weight: 68.2 kg (150 lb 5.7 oz) IBW/kg (Calculated) : 54.7 Heparin Dosing Weight: 68.7 kg   Vital Signs: Temp: 98.4 F (36.9 C) (07/22 0730) Temp Source: Oral (07/22 0730) BP: 175/72 (07/22 0800) Pulse Rate: 66 (07/22 0800)   Labs: Recent Labs (last 2 labs)       Recent Labs    10/02/22 2000 10/03/22 0306 10/03/22  10/03/22 0914         1934  HGB 13.0 14.4  --              ---  HCT 40.9 44.9  --              ---  PLT 199 203  --              ---  HEPARINUNFRC  --   --  >1.10*     >1.10  CREATININE 1.64* 1.41*  --              ----        Estimated Creatinine Clearance: 27.7 mL/min (A) (by C-G formula based on SCr of 1.41 mg/dL (H)).   Assessment: 67 yoF presented to ED with knee pain with DVT US showing chronic DVT in opposite leg of pain and CTA positive for acute submassive PE with evidence of right heart strain( RV/LV ration= 1.25). Pharmacy consulted to dose heparin for PE. CBC stable and no PTA anticoagulation.   HL >1.1, supratherapeutic     Goal of Therapy:  Heparin level 0.3-0.7 units/ml Monitor platelets by anticoagulation protocol: Yes   Plan:  Hold heparin infusion for 1 hour, then restart  Decrease heparin infusion to 700 units/hr Check anti-Xa level in 8 hours and daily while on heparin Continue to monitor H&H and platelets

## 2022-10-03 NOTE — Progress Notes (Signed)
Palliative: Thank you for this consult. Unfortunately due to high volume of consults there will be a delay in a Palliative Provider seeing this patient. Palliative Medicine will return to service on 10/04/2022 and will see patient at that time.  No charge Lillia Carmel, NP Palliative Medicine Please call Palliative Medicine team phone with any questions [phone]. For individual providers please see AMION

## 2022-10-04 ENCOUNTER — Other Ambulatory Visit (HOSPITAL_COMMUNITY): Payer: Self-pay

## 2022-10-04 ENCOUNTER — Encounter (HOSPITAL_COMMUNITY): Payer: Self-pay | Admitting: Family Medicine

## 2022-10-04 DIAGNOSIS — E782 Mixed hyperlipidemia: Secondary | ICD-10-CM | POA: Diagnosis not present

## 2022-10-04 DIAGNOSIS — I2699 Other pulmonary embolism without acute cor pulmonale: Secondary | ICD-10-CM | POA: Diagnosis not present

## 2022-10-04 DIAGNOSIS — Z515 Encounter for palliative care: Secondary | ICD-10-CM | POA: Diagnosis not present

## 2022-10-04 DIAGNOSIS — Z7189 Other specified counseling: Secondary | ICD-10-CM

## 2022-10-04 DIAGNOSIS — G9341 Metabolic encephalopathy: Secondary | ICD-10-CM

## 2022-10-04 LAB — CBC
HCT: 44.9 % (ref 36.0–46.0)
Hemoglobin: 14.5 g/dL (ref 12.0–15.0)
MCH: 31.5 pg (ref 26.0–34.0)
MCHC: 32.3 g/dL (ref 30.0–36.0)
MCV: 97.4 fL (ref 80.0–100.0)
Platelets: 220 10*3/uL (ref 150–400)
RBC: 4.61 MIL/uL (ref 3.87–5.11)
RDW: 15.2 % (ref 11.5–15.5)
WBC: 7.8 10*3/uL (ref 4.0–10.5)
nRBC: 0 % (ref 0.0–0.2)

## 2022-10-04 LAB — HEPARIN LEVEL (UNFRACTIONATED)
Heparin Unfractionated: >1.1 IU/mL — ABNORMAL HIGH (ref 0.30–0.70)
Heparin Unfractionated: 0.78 IU/mL — ABNORMAL HIGH (ref 0.30–0.70)

## 2022-10-04 LAB — CULTURE, BLOOD (ROUTINE X 2): Culture: NO GROWTH

## 2022-10-04 MED ORDER — VITAMIN C 500 MG PO TABS
500.0000 mg | ORAL_TABLET | Freq: Every day | ORAL | Status: DC
  Administered 2022-10-04 – 2022-10-06 (×3): 500 mg via ORAL
  Filled 2022-10-04 (×3): qty 1

## 2022-10-04 MED ORDER — HEPARIN (PORCINE) 25000 UT/250ML-% IV SOLN
500.0000 [IU]/h | INTRAVENOUS | Status: AC
  Filled 2022-10-04 (×2): qty 250

## 2022-10-04 MED ORDER — METHYLPREDNISOLONE SODIUM SUCC 40 MG IJ SOLR
40.0000 mg | Freq: Every day | INTRAMUSCULAR | Status: DC
  Administered 2022-10-05 – 2022-10-06 (×2): 40 mg via INTRAVENOUS
  Filled 2022-10-04 (×2): qty 1

## 2022-10-04 MED ORDER — ZINC SULFATE 220 (50 ZN) MG PO CAPS
220.0000 mg | ORAL_CAPSULE | Freq: Every day | ORAL | Status: DC
  Administered 2022-10-04 – 2022-10-06 (×3): 220 mg via ORAL
  Filled 2022-10-04 (×3): qty 1

## 2022-10-04 NOTE — Progress Notes (Signed)
Progress Note   Patient: Nicole LORTON Bailey:[id number] DOB: 1934-04-02 DOA: 10/02/2022     1 DOS: the patient was seen and examined on 10/04/2022   Brief hospital admission narrative: As per H&P written by Dr.Zierle-Ghosh on 10/03/22 AAMINA Bailey is a 88 y.o. female with medical history significant of hypertension, hyperlipidemia, hypothyroidism, DVT/PE, stroke, silent microhemorrhage of brain, and more presents the ED with a chief complaint of altered mental status.  Patient is typically alert and oriented x 4.  She has been subtly confused.  For example, she is not able to say which medication she took today.  She is unsure why she is in the hospital and thinks it has to do with her legs.  She was recently seen for leg pain and suspected DVT in the lower extremities.  When asked to elaborate patient says she is not in any pain today and she does not remember having been in with pain in her legs.  A few days ago she had a ultrasound unilateral left lower extremity that showed a subacute to chronic appearing nonocclusive deep vein thrombosis.  Patient was not on anticoagulation due to history of brain bleed.  She was supposed to have follow-up with her outpatient provider this week.  Today in the ER patient was febrile, tachycardic, and requiring 2 L nasal cannula.  CTA chest was done that showed an acute PE with evidence of right heart strain consistent with submassive PE.  There was a new right solid pulmonary nodule which will need outpatient follow-up.  She has a stable left thyroid goiter.  CT of the abdomen pelvis showed cholelithiasis without complicating factors.  Pulmonary was consulted and recommended starting heparin drip despite history of brain bleed and then getting a neuroconsult to further sort that out.  Due to patient being febrile in the ER UA was done that was not indicative of UTI.  Blood cultures pending.  Chest x-ray shows no evidence of acute disease.  Patient is  COVID-positive.  At the time of my exam, patient is able to say she is not currently in any pain, she has no further complaints.   Patient was made full code as her advance directives give specific scenarios about patient remaining in a vegetative state or having some terminal illness then she would want treatment withdrawn, but she does not specifically have a DNR.  Assessment and Plan: * Acute pulmonary embolism (HCC) - With history of DVTs  - Currently not on coagulation in the outpatient setting due to history of bleed -Unfortunately given presentation with pulmonary embolism and per CT scan right heart strain, decision made to start patient on heparin drip, closely follow her for upper bleeding and if stable transition to oral blood thinners after 48 hours of heparin. -Currently without significant chest discomfort and no requiring oxygen supplementation.  Tachypneic with activity. -No overt bleeding -Continue heparin drip; looking transition to oral anticoagulation therapy if she remains stable on 10/05/2022. -2D echo demonstrating grade 1 diastolic dysfunction, preserved ejection fraction, moderate elevated pulmonary artery systolic pressure with a right ventricle normal systolic function.  Right heart strain has been ruled out by echo.  Acute metabolic encephalopathy - Present at time of admission probably in the setting of fever, mild decrease oxygen saturation with pulmonary embolism. -Patient mentation is back to normal -No requiring oxygen supplementation and in no acute distress -Continue supportive care.  COVID-19 virus infection - COVID-positive - With fever at home; has remained afebrile while inpatient. - No  requiring oxygen supplementation and reporting no significant complaints from a COVID standpoint -Will initiate steroids tapering -Continue vitamin C and zinc -Repeat CMET in AM.  Acute kidney injury superimposed on chronic kidney disease (HCC) - Creatinine increased  from 1.11>> 1.64 - Continue minimizing nephrotoxic agent; avoid hypotension and the use of contrast. -Hydration hydration -Follow renal function trend.  Mixed hyperlipidemia - Continue Zetia and Crestor -Heart healthy diet discussed with patient.  Essential hypertension -Continue current antihypertensive regimen -Continue as needed labetalol and hydralazine -Follow-up vital signs -Heart healthy diet discussed with patient.  Goals of care discussion: -Patient is DNR -will follow further GOC discussion regarding outpatient follow up with palliative care -continue current treatment.   Prior history of seizure -Continue Keppra -No seizure activity appreciated.  History of colon cancer -Outpatient follow-up with GI -she received surgical resection in 2011.  Subjective:  No chest pain, no nausea or vomiting; eating appreciated.  There has not been any neurologic deficits.  Patient is not requiring oxygen supplementation despite having some tachypnea on exertion.  Denies chest pain.  Physical Exam: Vitals:   10/04/22 1200 10/04/22 1300 10/04/22 1400 10/04/22 1500  BP: (!) 163/74 (!) 140/57 (!) 148/56 (!) 144/62  Pulse: 71 72 69 68  Resp: (!) 28 (!) 29 (!) 26 (!) 24  Temp:      TempSrc:      SpO2: 96% 95% 95% 95%  Weight:      Height:       General exam: Alert, awake, oriented x 3; reporting short winded sensation; no chest pain and no requiring oxygen.  Patient is afebrile.  No overt bleeding seen. Respiratory system: Good air movement bilaterally; no using accessory muscles.  Mild tachypnea with exertion appreciated. Cardiovascular system:RRR. No rubs or gallops. Gastrointestinal system: Abdomen is nondistended, soft and nontender. No organomegaly or masses felt. Normal bowel sounds heard. Central nervous system: No focal neurological deficits. Extremities: No cyanosis or clubbing. Skin: No petechiae. Psychiatry: Judgement and insight appear normal. Mood & affect  appropriate.   Data Reviewed: CBC: White blood cell 7.8, hemoglobin 14.5 and platelet count 220 K  Family Communication: no family at bedside.  Disposition: Status is: Inpatient Remains inpatient appropriate because: Continue IV heparin and transition to oral blood thinner in the next 24 to 48 hours for acute pulmonary embolism.  Continue supportive care for positive COVID.   Planned Discharge Destination: Home  Time spent: 50 minutes  Author: Vassie Loll, MD 10/04/2022 3:27 PM  For on call review [email or web address].

## 2022-10-04 NOTE — Progress Notes (Signed)
Heparin on hold for 1 hour per pharmacy.

## 2022-10-04 NOTE — Progress Notes (Signed)
ANTICOAGULATION CONSULT NOTE - Follow Up Consult  Pharmacy Consult for heparin Indication: pulmonary embolus  Allergies  Allergen Reactions   Tape Rash    Zio monitor adhesive causes ulcerated and infected skin .Had to see derm    Patient Measurements: Height: 5\' 4"  (162.6 cm) Weight: 68.2 kg (150 lb 5.7 oz) IBW/kg (Calculated) : 54.7 Heparin Dosing Weight: 68.2  Vital Signs: BP: 170/79 (07/23 0700) Pulse Rate: 76 (07/23 0700)  Labs: Recent Labs    10/02/22 2000 10/03/22 0306 10/03/22 0914 10/03/22 1931 10/04/22 0509  HGB 13.0 14.4  --   --  14.5  HCT 40.9 44.9  --   --  44.9  PLT 199 203  --   --  220  HEPARINUNFRC  --   --  >1.10* >1.10* >1.10*  CREATININE 1.64* 1.41*  --   --   --     Estimated Creatinine Clearance: 27.7 mL/min (A) (by C-G formula based on SCr of 1.41 mg/dL (H)).   Assessment: 79 yoF presented to ED with knee pain with DVT US showing chronic DVT in opposite leg of pain and CTA positive for acute submassive PE with evidence of right heart strain( RV/LV ration= 1.25). Pharmacy consulted to dose heparin for PE. CBC stable and no PTA anticoagulation.   HL >1.1, supratherapeutic. Hgb and plt wnl and stable, no s/s of bleeding. Heparin level drawn appropriately.   Goal of Therapy:  Heparin level 0.3-0.7 units/ml Monitor platelets by anticoagulation protocol: Yes   Plan:  Hold heparin infusion for 1 hour Decrease heparin infusion to 500 units/hr Collect heparin level in 8h and daily while on heparin Monitor daily H&H and platelets  Rennis Petty, PharmD PGY1 Pharmacy Resident 10/04/2022 7:05 AM

## 2022-10-04 NOTE — Consult Note (Signed)
Consultation Note Date: 10/04/2022   Patient Name: [name]  DOB: 1934/09/05  MRN: [id number]  Age / Sex: 88 y.o., female  PCP: Anabel Halon, MD Referring Physician: Vassie Loll, MD  Reason for Consultation: Establishing goals of care  HPI/Patient Profile: 88 y.o. female  with past medical history of HTN/HLD, hypothyroidism, history of colon cancer 2011, DVT/PE, stroke/silent microhemorrhage of brain 2020, arthritis, admitted on 10/02/2022 with acute pulmonary embolism with a history of DVTs not currently on anticoagulation due to history of bleed, acute metabolic encephalopathy which has resolved likely related to being COVID-positive.   Clinical Assessment and Goals of Care: I have reviewed medical records including EPIC notes, labs and imaging, received report from RN, assessed the patient.  [name] is resting quietly in bed.  She wakes easily when I enter the room.  She greets me, making and mostly keeping eye contact.  She is alert and oriented x 3, able to make her needs known.  There is no family at bedside at this time.  We meet at the bedside to discuss diagnosis prognosis, GOC, EOL wishes, disposition and options.  I introduced Palliative Medicine as specialized medical care for people living with serious illness. It focuses on providing relief from the symptoms and stress of a serious illness. The goal is to improve quality of life for both the patient and the family.  We discussed a brief life review of the patient.  [name] tells me that she is a widow.  She shares that her daughter, [name] assists her/lives with her the majority of the time although [name] maintains her own household still.  [name] shares that she is independent with ADLs/IADLs.  Sharing that the only thing she no longer does drive.  We then focused on their current illness.  We  talked about her acute PE.  [name] shares that she has a history.  She talks at length about her health concerns immediately prior to this admission.  We talked about the plan for further anticoagulation.  At this point [name] is agreeable to continue anticoagulation.  The natural disease trajectory and expectations at EOL were discussed.   Advanced directives, concepts specific to code status, artifical feeding and hydration, and rehospitalization were considered and discussed.  We talked about the concept of "treat the treatable, but allow a natural passing".  [name] states that she would not want attempted resuscitation.  She goes further to state that she has completed this paperwork which should be on file with [facility].  I share that I will update her record and change her orders.  I ask if her daughter [name] is aware of her choice and in agreement.  [name] states that [name] is aware and in agreement.  Palliative Care services outpatient were not discussed today.  PMT will discuss during next visit.  Discussed the importance of continued conversation with family and the medical providers regarding overall plan of care and treatment options, ensuring decisions are within the context  of the patient's values and GOCs.  Questions and concerns were addressed.  The family was encouraged to call with questions or concerns.  PMT will continue to support holistically.  Conference with attending, bedside nursing staff, transition of care team related to patient condition, needs, goals of care, disposition.   HCPOA  NEXT OF KIN -[name] names her daughter, [name], as her healthcare surrogate.  She shares that she has a son who lives in [place] but [name] is her healthcare surrogate.    SUMMARY OF RECOMMENDATIONS   At this point continue to treat the treatable but no CPR or intubation Would prefer to go home with home health if needed. At this  point agreeable to anticoagulation outpatient   Code Status/Advance Care Planning: DNR -  We talked about the concept of "treat the treatable, but allow a natural passing".  [name] states that she would not want attempted resuscitation.  She goes further to state that she has completed this paperwork which should be on file with [facility].  I share that I will update her record and change her orders.  I ask if her daughter [name] is aware of her choice and in agreement.  [name] states that [name] is aware and in agreement.  Symptom Management:  Per hospitalist, no additional needs at this time.  Palliative Prophylaxis:  Frequent Pain Assessment and Oral Care  Additional Recommendations (Limitations, Scope, Preferences): Continue to treatment no CPR or intubation  Psycho-social/Spiritual:  Desire for further Chaplaincy support:no Additional Recommendations: Caregiving  Support/Resources  Prognosis:  Unable to determine, based on outcomes.  6 to 12 months or less would not be surprising based on acute PE, chronic health burden.  Discharge Planning: Home with Home Health      Primary Diagnoses: Present on Admission:  Acute pulmonary embolism (HCC)  Mixed hyperlipidemia  Essential hypertension   I have reviewed the medical record, interviewed the patient and family, and examined the patient. The following aspects are pertinent.  Past Medical History:  Diagnosis Date   Allergy    Arthritis    Colon cancer (HCC)    colon ca dx 07/30/09   History of cardiac monitoring 07/2017   "Event monitor demonstrated sinus rhythm with isolated PACs and no arrhythmias"   History of colon cancer 06/2009   found at time of TCS 06/29/09, 1.2cm sessile cecal polyp, no adjuvent therapy needed   HTN (hypertension)    Hx of cardiovascular stress test 07/2017   "No diagnostic ST segment changes to indicate ischemia. Small, moderate intensity, reversible apical to basal inferolateral  defect consistent with ischemia. This is a low risk study. Nuclear stress EF: 84%."   Hyperlipidemia    Hypothyroidism    PE (pulmonary thromboembolism) (HCC)    Renal disorder    cyst on kidney    Silent micro-hemorrhage of brain (HCC) 11/25/2018   Stroke (HCC)    TIA (transient ischemic attack) 11/25/2018   Vertigo    Social History   Socioeconomic History   Marital status: Widowed    Spouse name: Not on file   Number of children: 2   Years of education: Not on file   Highest education level: Not on file  Occupational History   Occupation: Agricultural consultant at [employer]    Employer: RETIRED   Occupation: retired from Designer, fashion/clothing  Tobacco Use   Smoking status: Never   Smokeless tobacco: Never  Vaping Use   Vaping status: Never Used  Substance and Sexual Activity  Alcohol use: No    Alcohol/week: 0.0 standard drinks of alcohol   Drug use: No   Sexual activity: Not Currently  Other Topics Concern   Not on file  Social History Narrative   Not on file   Social Determinants of Health   Financial Resource Strain: Low Risk  (11/13/2020)   Overall Financial Resource Strain (CARDIA)    Difficulty of Paying Living Expenses: Not very hard  Food Insecurity: No Food Insecurity (10/03/2022)   Hunger Vital Sign    Worried About Running Out of Food in the Last Year: Never true    Ran Out of Food in the Last Year: Never true  Transportation Needs: No Transportation Needs (10/03/2022)   PRAPARE - Administrator, Civil Service (Medical): No    Lack of Transportation (Non-Medical): No  Physical Activity: Insufficiently Active (11/22/2021)   Exercise Vital Sign    Days of Exercise per Week: 4 days    Minutes of Exercise per Session: 10 min  Stress: No Stress Concern Present (11/13/2020)   [employer] of Occupational Health - Occupational Stress Questionnaire    Feeling of Stress : Only a little  Social Connections: Moderately Isolated (11/13/2020)   Social Connection and Isolation Panel  [NHANES]    Frequency of Communication with Friends and Family: More than three times a week    Frequency of Social Gatherings with Friends and Family: More than three times a week    Attends Religious Services: More than 4 times per year    Active Member of [employer] or Organizations: No    Attends Banker Meetings: Never    Marital Status: Widowed   Family History  Problem Relation Age of Onset   Colon cancer Mother        >age60   Arthritis Mother    Cancer Mother    Heart disease Mother    Hyperlipidemia Mother    Hypertension Mother    Heart attack Father    Heart disease Father    Diabetes Maternal Aunt    Hyperlipidemia Daughter    Hypertension Daughter    Liver disease Neg Hx    Scheduled Meds:  Chlorhexidine Gluconate Cloth  6 each Topical Q0600   clopidogrel  75 mg Oral Daily   diltiazem  180 mg Oral Daily   ezetimibe  10 mg Oral Daily   gabapentin  300 mg Oral TID   levETIRAcetam  250 mg Oral BID   methylPREDNISolone (SOLU-MEDROL) injection  125 mg Intravenous Daily   metoprolol succinate  100 mg Oral Daily   rosuvastatin  40 mg Oral Daily   topiramate  50 mg Oral BID   Continuous Infusions:  heparin 500 Units/hr (10/04/22 0840)   PRN Meds:.acetaminophen **OR** acetaminophen, diltiazem, hydrALAZINE, labetalol, ondansetron **OR** ondansetron (ZOFRAN) IV, oxyCODONE Medications Prior to Admission:  Prior to Admission medications   Medication Sig Start Date End Date Taking? Authorizing Provider  acetaminophen (TYLENOL) 500 MG tablet Take 500 mg by mouth every 8 (eight) hours as needed for mild pain or headache.   Yes [provider]  ascorbic acid (VITAMIN C) 250 MG CHEW Chew 250 mg by mouth daily.   Yes [provider]  ASPERCREME LIDOCAINE EX Apply 1 application topically daily as needed (for knee pain).    Yes [provider]  cholecalciferol (VITAMIN D3) 25 MCG (1000 UT) tablet Take 1,000 Units by mouth daily.    Yes  [provider]  clopidogrel (PLAVIX) 75 MG  tablet TAKE ONE TABLET BY MOUTH ONCE DAILY. 06/27/22  Yes BranchDorothe Pea, MD  diltiazem (CARDIZEM CD) 180 MG 24 hr capsule Take 1 capsule (180 mg total) by mouth daily. 09/22/22  Yes Branch, Dorothe Pea, MD  diltiazem (CARDIZEM) 30 MG tablet Take 30 mg by mouth every 8 (eight) hours as needed (Palpitations).   Yes [provider]  ezetimibe (ZETIA) 10 MG tablet Take 1 tablet (10 mg total) by mouth daily. 04/05/22 03/31/23 Yes BranchDorothe Pea, MD  gabapentin (NEURONTIN) 300 MG capsule Take 300 mg by mouth 3 (three) times daily.   Yes [provider]  levETIRAcetam (KEPPRA) 500 MG tablet Take 250 mg by mouth 2 (two) times daily.   Yes [provider]  Lifitegrast Benay Spice) 5 % SOLN Apply 1 drop to eye daily as needed (dry eyes).   Yes [provider]  LINZESS 72 MCG capsule TAKE (1) CAPSULE BY MOUTH DAILY AS NEEDED FOR CONSTIPATION. Patient taking differently: Take 72 mcg by mouth daily before breakfast. 09/19/22  Yes Anabel Halon, MD  loratadine (CLARITIN) 10 MG tablet Take 10 mg by mouth daily.    Yes [provider]  meclizine (ANTIVERT) 25 MG tablet Take 1 tablet (25 mg total) by mouth 3 (three) times daily as needed for dizziness. 11/03/20  Yes Delo, Riley Lam, MD  metoprolol succinate (TOPROL-XL) 100 MG 24 hr tablet TAKE 1 TABLET BY MOUTH INTHE MORNING AND AT BEDTIME. TAKE WITH OR IMMEDIATELY FOLLOWING A MEAL. Patient taking differently: Take 100 mg by mouth in the morning and at bedtime. 06/27/22  Yes Branch, Dorothe Pea, MD  rosuvastatin (CRESTOR) 40 MG tablet TAKE ONE TABLET BY MOUTH ONCE DAILY. 09/12/22  Yes BranchDorothe Pea, MD  topiramate (TOPAMAX) 50 MG tablet Take 50 mg by mouth 2 (two) times daily.   Yes [provider]  traMADol (ULTRAM) 50 MG tablet Take 0.5-1 tablets (25-50 mg total) by mouth every 12 (twelve) hours as needed for severe pain. 09/30/22  Yes Idol, Raynelle Fanning, PA-C   predniSONE (DELTASONE) 20 MG tablet Take 1 tablet (20 mg total) by mouth daily with breakfast. Patient not taking: Reported on 10/03/2022 09/13/22   Anabel Halon, MD   Allergies  Allergen Reactions   Tape Rash    Zio monitor adhesive causes ulcerated and infected skin .Had to see derm   Review of Systems  Unable to perform ROS: Age    Physical Exam Vitals and nursing note reviewed.  Constitutional:      General: She is not in acute distress.    Appearance: She is not ill-appearing.  HENT:     Mouth/Throat:     Mouth: Mucous membranes are moist.  Cardiovascular:     Rate and Rhythm: Normal rate.  Pulmonary:     Effort: Pulmonary effort is normal. No respiratory distress.  Skin:    General: Skin is warm and dry.  Neurological:     Mental Status: She is alert and oriented to person, place, and time.  Psychiatric:        Mood and Affect: Mood normal.        Behavior: Behavior normal.     Vital Signs: BP (!) 159/89   Pulse 72   Temp 98.3 F (36.8 C) (Oral)   Resp 11   Ht 5\' 4"  (1.626 m)   Wt 66.9 kg   SpO2 95%   BMI 25.32 kg/m  Pain Scale: 0-10   Pain Score: 0-No pain   SpO2: SpO2:  95 % O2 Device:SpO2: 95 % O2 Flow Rate: .O2 Flow Rate (L/min): 2 L/min  IO: Intake/output summary:  Intake/Output Summary (Last 24 hours) at 10/04/2022 0919 Last data filed at 10/04/2022 0749 Gross per 24 hour  Intake 465.64 ml  Output 1050 ml  Net -584.36 ml    LBM: Last BM Date : 10/02/22 Baseline Weight: Weight: 68.2 kg Most recent weight: Weight: 66.9 kg     Palliative Assessment/Data:     Time In: 0810  Time Out: 0905  Time Total: 55 minutes  Greater than 50%  of this time was spent counseling and coordinating care related to the above assessment and plan.  Signed by: Katheran Awe, NP   Please contact Palliative Medicine Team phone at [phone] for questions and concerns.  For individual provider: See Loretha Stapler

## 2022-10-04 NOTE — Progress Notes (Signed)
ANTICOAGULATION CONSULT NOTE - Follow Up Consult  Pharmacy Consult for heparin Indication: pulmonary embolus  Allergies  Allergen Reactions   Tape Rash    Zio monitor adhesive causes ulcerated and infected skin .Had to see derm    Patient Measurements: Height: 5\' 4"  (162.6 cm) Weight: 66.9 kg (147 lb 7.8 oz) IBW/kg (Calculated) : 54.7 Heparin Dosing Weight: 68.2  Vital Signs: Temp: 97.7 F (36.5 C) (07/23 1549) Temp Source: Oral (07/23 1549) BP: 150/79 (07/23 1625) Pulse Rate: 66 (07/23 1625)  Labs: Recent Labs    10/02/22 2000 10/03/22 0306 10/03/22 0914 10/03/22 1931 10/04/22 0509 10/04/22 1627  HGB 13.0 14.4  --   --  14.5  --   HCT 40.9 44.9  --   --  44.9  --   PLT 199 203  --   --  220  --   HEPARINUNFRC  --   --    < > >1.10* >1.10* 0.78*  CREATININE 1.64* 1.41*  --   --   --   --    < > = values in this interval not displayed.    Estimated Creatinine Clearance: 27.4 mL/min (A) (by C-G formula based on SCr of 1.41 mg/dL (H)).   Assessment: 22 yoF presented to ED with knee pain with DVT US showing chronic DVT in opposite leg of pain and CTA positive for acute submassive PE with evidence of right heart strain( RV/LV ration= 1.25). Pharmacy consulted to dose heparin for PE. CBC stable and no PTA anticoagulation.   HL 0.78. slightly supratherapeutic.   Goal of Therapy:  Heparin level 0.3-0.7 units/ml Monitor platelets by anticoagulation protocol: Yes   Plan:  Decrease heparin infusion to 400 units/hr Collect heparin level in 8h and daily while on heparin Monitor daily H&H and platelets  Judeth Cornfield, PharmD Clinical Pharmacist 10/04/2022 5:01 PM

## 2022-10-04 NOTE — TOC Benefit Eligibility Note (Signed)
Pharmacy Patient Advocate Encounter  Insurance verification completed.    The patient is insured through [employer] Medicare Part D  Ran test claim for Eliquis 5 mg and the current 30 day co-pay is $47.00.   This test claim was processed through [facility]- copay amounts may vary at other pharmacies due to pharmacy/plan contracts, or as the patient moves through the different stages of their insurance plan.    Jeffrey Smith, CPHT Pharmacy Patient Advocate Specialist [facility] Patient Advocate Team Direct Number: [phone]  Fax: [phone] 

## 2022-10-05 DIAGNOSIS — N189 Chronic kidney disease, unspecified: Secondary | ICD-10-CM | POA: Diagnosis not present

## 2022-10-05 DIAGNOSIS — N179 Acute kidney failure, unspecified: Secondary | ICD-10-CM | POA: Diagnosis not present

## 2022-10-05 DIAGNOSIS — I2699 Other pulmonary embolism without acute cor pulmonale: Secondary | ICD-10-CM | POA: Diagnosis not present

## 2022-10-05 DIAGNOSIS — U071 COVID-19: Secondary | ICD-10-CM | POA: Diagnosis not present

## 2022-10-05 LAB — COMPREHENSIVE METABOLIC PANEL
ALT: 47 U/L — ABNORMAL HIGH (ref 0–44)
AST: 36 U/L (ref 15–41)
Albumin: 3.4 g/dL — ABNORMAL LOW (ref 3.5–5.0)
Alkaline Phosphatase: 74 U/L (ref 38–126)
Anion gap: 10 (ref 5–15)
BUN: 29 mg/dL — ABNORMAL HIGH (ref 8–23)
CO2: 19 mmol/L — ABNORMAL LOW (ref 22–32)
Calcium: 8.8 mg/dL — ABNORMAL LOW (ref 8.9–10.3)
Chloride: 108 mmol/L (ref 98–111)
Creatinine, Ser: 1.38 mg/dL — ABNORMAL HIGH (ref 0.44–1.00)
GFR, Estimated: 38 mL/min — ABNORMAL LOW (ref >60)
Glucose, Bld: 141 mg/dL — ABNORMAL HIGH (ref 70–99)
Potassium: 3.6 mmol/L (ref 3.5–5.1)
Sodium: 137 mmol/L (ref 135–145)
Total Bilirubin: 0.5 mg/dL (ref 0.3–1.2)
Total Protein: 7.4 g/dL (ref 6.5–8.1)

## 2022-10-05 LAB — CBC
HCT: 43.4 % (ref 36.0–46.0)
Hemoglobin: 14.4 g/dL (ref 12.0–15.0)
MCH: 31.9 pg (ref 26.0–34.0)
MCHC: 33.2 g/dL (ref 30.0–36.0)
MCV: 96 fL (ref 80.0–100.0)
Platelets: 227 10*3/uL (ref 150–400)
RBC: 4.52 MIL/uL (ref 3.87–5.11)
RDW: 15.5 % (ref 11.5–15.5)
WBC: 15.5 10*3/uL — ABNORMAL HIGH (ref 4.0–10.5)
nRBC: 0 % (ref 0.0–0.2)

## 2022-10-05 LAB — HEPARIN LEVEL (UNFRACTIONATED)
Heparin Unfractionated: 0.47 IU/mL (ref 0.30–0.70)
Heparin Unfractionated: 0.36 IU/mL (ref 0.30–0.70)

## 2022-10-05 MED ORDER — APIXABAN 5 MG PO TABS: 5.0000 mg | ORAL_TABLET | Freq: Two times a day (BID) | ORAL | Status: DC

## 2022-10-05 MED ORDER — APIXABAN 5 MG PO TABS
10.0000 mg | ORAL_TABLET | Freq: Two times a day (BID) | ORAL | Status: DC
  Administered 2022-10-06: 10 mg via ORAL
  Filled 2022-10-05 (×2): qty 2

## 2022-10-05 NOTE — Progress Notes (Signed)
ANTICOAGULATION CONSULT NOTE - Follow Up Consult  Pharmacy Consult for heparin Indication: pulmonary embolus  Allergies  Allergen Reactions   Tape Rash    Zio monitor adhesive causes ulcerated and infected skin .Had to see derm    Patient Measurements: Height: 5\' 4"  (162.6 cm) Weight: 66.9 kg (147 lb 7.8 oz) IBW/kg (Calculated) : 54.7 Heparin Dosing Weight: 68.2  Vital Signs: Temp: 98 F (36.7 C) (07/23 2100) Temp Source: Oral (07/23 2100) BP: 148/63 (07/23 2300) Pulse Rate: 56 (07/23 2300)  Labs: Recent Labs    10/02/22 2000 10/03/22 0306 10/03/22 0914 10/04/22 0509 10/04/22 1627 10/05/22 0017  HGB 13.0 14.4  --  14.5  --   --   HCT 40.9 44.9  --  44.9  --   --   PLT 199 203  --  220  --   --   HEPARINUNFRC  --   --    < > >1.10* 0.78* 0.47  CREATININE 1.64* 1.41*  --   --   --   --    < > = values in this interval not displayed.    Estimated Creatinine Clearance: 27.4 mL/min (A) (by C-G formula based on SCr of 1.41 mg/dL (H)).   Assessment: 56 yoF presented to ED with knee pain with DVT US showing chronic DVT in opposite leg of pain and CTA positive for acute submassive PE with evidence of right heart strain( RV/LV ration= 1.25). Pharmacy consulted to dose heparin for PE. CBC stable and no PTA anticoagulation.   HL 0.78. slightly supratherapeutic.   7/24 AM: Heparin level returned at 0.47 on 400 units/hr (therapeutic). No signs/symptoms of bleeding reported or issues with the infusion. Will continue current rate  Goal of Therapy:  Heparin level 0.3-0.7 units/ml Monitor platelets by anticoagulation protocol: Yes   Plan:  Continue heparin infusion to 400 units/hr Collect heparin level in 8h and daily while on heparin Monitor daily H&H and platelets  Arabella Merles, PharmD. Clinical Pharmacist 10/05/2022 1:14 AM

## 2022-10-05 NOTE — Progress Notes (Addendum)
ANTICOAGULATION CONSULT NOTE - Follow Up Consult  Pharmacy Consult for heparin Indication: pulmonary embolus  Allergies  Allergen Reactions   Tape Rash    Zio monitor adhesive causes ulcerated and infected skin .Had to see derm    Patient Measurements: Height: 5\' 4"  (162.6 cm) Weight: 66.9 kg (147 lb 7.8 oz) IBW/kg (Calculated) : 54.7 Heparin Dosing Weight: 68.2  Vital Signs: Temp: 97.4 F (36.3 C) (07/24 0814) Temp Source: Oral (07/24 0814) BP: 144/64 (07/24 0700) Pulse Rate: 59 (07/24 0700)  Labs: Recent Labs    10/02/22 2000 10/03/22 0306 10/03/22 0914 10/04/22 0509 10/04/22 1627 10/05/22 0017 10/05/22 0508 10/05/22 0914  HGB 13.0 14.4  --  14.5  --   --  14.4  --   HCT 40.9 44.9  --  44.9  --   --  43.4  --   PLT 199 203  --  220  --   --  227  --   HEPARINUNFRC  --   --    < > >1.10* 0.78* 0.47  --  0.36  CREATININE 1.64* 1.41*  --   --   --   --  1.38*  --    < > = values in this interval not displayed.    Estimated Creatinine Clearance: 28 mL/min (A) (by C-G formula based on SCr of 1.38 mg/dL (H)).   Assessment: 38 yoF presented to ED with knee pain with DVT US showing chronic DVT in opposite leg of pain and CTA positive for acute submassive PE with evidence of right heart strain( RV/LV ration= 1.25). Pharmacy consulted to dose heparin for PE. CBC stable and no PTA anticoagulation.  Goal of Therapy:  Heparin level 0.3-0.7 units/ml Monitor platelets by anticoagulation protocol: Yes  Date Time aPTT/HL Rate/Comment 7/23 1627 0.78  Supratherapeutic; 500>>400 units/hr 7/24  0017 0.47  Therapeutic x 1; 400 units/hr 7/24 0914 0.36  Therapeutic x 2; 400 units/hr    PLAN: Table of heparin levels added for changes starting evening of 7/23. Per MD, will transition to Eliquis on 7/25. Orders for Eliquis and stop time for heparin have been placed. Continue heparin infusion at 400 units/hour. Check HL with AM labs Continue to monitor CBC daily while on heparin  infusion.   Will M. Dareen Piano, PharmD Clinical Pharmacist 10/05/2022 9:57 AM

## 2022-10-05 NOTE — Progress Notes (Signed)
Progress Note   Patient: [name] UJW:[id number] DOB: [birth year] DOA: 10/02/2022     2 DOS: the patient was seen and examined on 10/05/2022   Brief hospital admission narrative: As per H&P written by Dr.Zierle-Ghosh on 10/03/22 [name] is a 88 y.o. female with medical history significant of hypertension, hyperlipidemia, hypothyroidism, DVT/PE, stroke, silent microhemorrhage of brain, and more presents the ED with a chief complaint of altered mental status.  Patient is typically alert and oriented x 4.  She has been subtly confused.  For example, she is not able to say which medication she took today.  She is unsure why she is in the hospital and thinks it has to do with her legs.  She was recently seen for leg pain and suspected DVT in the lower extremities.  When asked to elaborate patient says she is not in any pain today and she does not remember having been in with pain in her legs.  A few days ago she had a ultrasound unilateral left lower extremity that showed a subacute to chronic appearing nonocclusive deep vein thrombosis.  Patient was not on anticoagulation due to history of brain bleed.  She was supposed to have follow-up with her outpatient provider this week.  Today in the ER patient was febrile, tachycardic, and requiring 2 L nasal cannula.  CTA chest was done that showed an acute PE with evidence of right heart strain consistent with submassive PE.  There was a new right solid pulmonary nodule which will need outpatient follow-up.  She has a stable left thyroid goiter.  CT of the abdomen pelvis showed cholelithiasis without complicating factors.  Pulmonary was consulted and recommended starting heparin drip despite history of brain bleed and then getting a neuroconsult to further sort that out.  Due to patient being febrile in the ER UA was done that was not indicative of UTI.  Blood cultures pending.  Chest x-ray shows no evidence of acute disease.  Patient is  COVID-positive.  At the time of my exam, patient is able to say she is not currently in any pain, she has no further complaints.   Patient was made full code as her advance directives give specific scenarios about patient remaining in a vegetative state or having some terminal illness then she would want treatment withdrawn, but she does not specifically have a DNR.  Assessment and Plan: 1) Acute pulmonary embolism with concerns for right heart strain -Patient with history of colon malignancy and prior recurrent DVTs ---2D echo demonstrating grade 1 diastolic dysfunction, preserved ejection fraction, moderate elevated pulmonary artery systolic pressure with a right ventricle normal systolic function.  -Continue IV heparin if no bleeding may transition to DOAC on 10/06/2022 -Patient may need lifelong anticoagulation  Acute metabolic encephalopathy --As per daughter [name] mentation appears to be improving  COVID-19 virus infection - COVID-positive --Continue steroids -Continue vitamin C and zinc -No oxygen requirement at this time  Acute kidney injury superimposed on chronic kidney disease (HCC) - Creatinine increased from 1.11>> 1.64 -renally adjust medications, avoid nephrotoxic agents / dehydration  / hypotension   Mixed hyperlipidemia - Continue Zetia and Crestor -Heart healthy diet discussed with patient.  Essential hypertension -Continue current antihypertensive regimen -Continue as needed labetalol and hydralazine -Follow-up vital signs -Heart healthy diet discussed with patient.  Goals of care discussion: -Patient is DNR -will follow further GOC discussion regarding outpatient follow up with palliative care -continue current treatment.   Prior history of seizure -Continue Keppra -No  seizure activity appreciated.  History of colon cancer -Outpatient follow-up with GI -she received surgical resection in 2011.  Subjective:  -Resting comfortably -No frank chest  pains -Had some dyspnea on exertion without significant hypoxia -Daughter [name] is visiting  Physical Exam: Vitals:   10/05/22 0900 10/05/22 1000 10/05/22 1112 10/05/22 1336  BP: (!) 163/90   (!) 158/68  Pulse: 62 61  65  Resp: (!) 22 (!) 24    Temp:   97.8 F (36.6 C)   TempSrc:   Oral   SpO2: 99% 97%  99%  Weight:      Height:       General exam: Alert, awake, oriented x 3; reporting short winded sensation; no chest pain and no requiring oxygen.  Patient is afebrile.  No overt bleeding seen. Respiratory system: Good air movement bilaterally; no using accessory muscles.  Mild tachypnea with exertion appreciated. Cardiovascular system:RRR. No rubs or gallops. Gastrointestinal system: Abdomen is nondistended, soft and nontender. No organomegaly or masses felt. Normal bowel sounds heard. Central nervous system: No focal neurological deficits. Extremities: No cyanosis or clubbing. Skin: No petechiae. Psychiatry: Judgement and insight appear normal. Mood & affect appropriate.   Data Reviewed: CBC: White blood cell 7.8, hemoglobin 14.5 and platelet count 220 K  Family Communication: Discussed with daughter [name]  Disposition:--Home Status is: Inpatient Remains inpatient appropriate because: Continue IV heparin and transition to oral blood thinner in the next 24 to 48 hours for acute pulmonary embolism.  Continue supportive care for positive COVID.   Planned Discharge Destination: Home  Author: Shon Hale, MD 10/05/2022 7:50 PM  For on call review [email or web address].

## 2022-10-06 ENCOUNTER — Ambulatory Visit: Payer: Medicare Other | Admitting: Internal Medicine

## 2022-10-06 DIAGNOSIS — U071 COVID-19: Secondary | ICD-10-CM | POA: Diagnosis not present

## 2022-10-06 DIAGNOSIS — N189 Chronic kidney disease, unspecified: Secondary | ICD-10-CM | POA: Diagnosis not present

## 2022-10-06 DIAGNOSIS — N179 Acute kidney failure, unspecified: Secondary | ICD-10-CM | POA: Diagnosis not present

## 2022-10-06 DIAGNOSIS — I2699 Other pulmonary embolism without acute cor pulmonale: Secondary | ICD-10-CM | POA: Diagnosis not present

## 2022-10-06 LAB — CBC
HCT: 44.2 % (ref 36.0–46.0)
Hemoglobin: 14.2 g/dL (ref 12.0–15.0)
MCH: 31.6 pg (ref 26.0–34.0)
MCHC: 32.1 g/dL (ref 30.0–36.0)
Platelets: 225 10*3/uL (ref 150–400)
RBC: 4.49 MIL/uL (ref 3.87–5.11)
WBC: 14 10*3/uL — ABNORMAL HIGH (ref 4.0–10.5)
nRBC: 0.1 % (ref 0.0–0.2)

## 2022-10-06 LAB — CULTURE, BLOOD (ROUTINE X 2)

## 2022-10-06 LAB — HEPARIN LEVEL (UNFRACTIONATED): Heparin Unfractionated: 0.24 IU/mL — ABNORMAL LOW (ref 0.30–0.70)

## 2022-10-06 MED ORDER — ORAL CARE MOUTH RINSE: 15.0000 mL | OROMUCOSAL | Status: DC | PRN

## 2022-10-06 MED ORDER — APIXABAN 5 MG PO TABS: 5.0000 mg | ORAL_TABLET | Freq: Two times a day (BID) | ORAL | 5 refills | Status: DC

## 2022-10-06 MED ORDER — ELIQUIS DVT/PE STARTER PACK 5 MG PO TBPK: ORAL_TABLET | ORAL | 0 refills | Status: DC

## 2022-10-06 MED ORDER — PANTOPRAZOLE SODIUM 40 MG PO TBEC: 40.0000 mg | DELAYED_RELEASE_TABLET | Freq: Every day | ORAL | 1 refills | Status: DC

## 2022-10-06 MED ORDER — PREDNISONE 20 MG PO TABS: 40.0000 mg | ORAL_TABLET | Freq: Every day | ORAL | 0 refills | Status: AC

## 2022-10-06 MED ORDER — HEPARIN BOLUS VIA INFUSION
1000.0000 [IU] | Freq: Once | INTRAVENOUS | Status: AC
  Administered 2022-10-06: 1000 [IU] via INTRAVENOUS
  Filled 2022-10-06: qty 1000

## 2022-10-06 NOTE — Progress Notes (Signed)
ANTICOAGULATION CONSULT NOTE - Follow Up Consult  Pharmacy Consult for heparin Indication: pulmonary embolus  Allergies  Allergen Reactions   Tape Rash    Zio monitor adhesive causes ulcerated and infected skin .Had to see derm    Patient Measurements: Height: 5\' 4"  (162.6 cm) Weight: 66.9 kg (147 lb 7.8 oz) IBW/kg (Calculated) : 54.7 Heparin Dosing Weight: 68.2  Vital Signs: Temp: 98.1 F (36.7 C) (07/25 0409) Temp Source: Oral (07/25 0409) BP: 134/83 (07/25 0409) Pulse Rate: 59 (07/25 0409)  Labs: Recent Labs    10/04/22 0509 10/04/22 1627 10/05/22 0017 10/05/22 0508 10/05/22 0914 10/06/22 0330  HGB 14.5  --   --  14.4  --  14.2  HCT 44.9  --   --  43.4  --  44.2  PLT 220  --   --  227  --  225  HEPARINUNFRC >1.10*   < > 0.47  --  0.36 0.24*  CREATININE  --   --   --  1.38*  --   --    < > = values in this interval not displayed.    Estimated Creatinine Clearance: 28 mL/min (A) (by C-G formula based on SCr of 1.38 mg/dL (H)).   Assessment: 89 yoF presented to ED with knee pain with DVT US showing chronic DVT in opposite leg of pain and CTA positive for acute submassive PE with evidence of right heart strain( RV/LV ration= 1.25). Pharmacy consulted to dose heparin for PE. CBC stable and no PTA anticoagulation.  Goal of Therapy:  Heparin level 0.3-0.7 units/ml Monitor platelets by anticoagulation protocol: Yes  Date Time aPTT/HL Rate/Comment 7/23 1627 0.78  Supratherapeutic; 500>>400 units/hr 7/24  0017 0.47  Therapeutic x 1; 400 7/24 0914 0.36  Therapeutic x 2; 400 7/25 0330 0.24  Subtherapeutic; 400>>500   PLAN: Transitioning to Eliquis later this morning. Orders for Eliquis and stop time for heparin have been placed. No further heparin levels after this dose change. Bolus 1000 units of heparin Increase heparin infusion to 500 units/hour. Continue to monitor CBC daily while on heparin infusion.   Will M. Dareen Piano, PharmD Clinical Pharmacist 10/06/2022  7:37 AM

## 2022-10-06 NOTE — Care Management Important Message (Signed)
Important Message  Patient Details  Name: [name] MRN: [id number] Date of Birth: August 20, 1934   Medicare Important Message Given:  N/A - LOS <3 / Initial given by admissions     Corey Harold 10/06/2022, 1:02 PM

## 2022-10-06 NOTE — Evaluation (Signed)
Physical Therapy Evaluation Patient Details Name: [name] MRN: [id number] DOB: [birth year] Today's Date: 10/06/2022  History of Present Illness  [name] is a 88 y.o. female with medical history significant of hypertension, hyperlipidemia, hypothyroidism, DVT/PE, stroke, silent microhemorrhage of brain, and more presents the ED with a chief complaint of altered mental status.  Patient is typically alert and oriented x 4.  She has been subtly confused.  For example, she is not able to say which medication she took today.  She is unsure why she is in the hospital and thinks it has to do with her legs.  She was recently seen for leg pain and suspected DVT in the lower extremities.  When asked to elaborate patient says she is not in any pain today and she does not remember having been in with pain in her legs.  A few days ago she had a ultrasound unilateral left lower extremity that showed a subacute to chronic appearing nonocclusive deep vein thrombosis.  Patient was not on anticoagulation due to history of brain bleed.  She was supposed to have follow-up with her outpatient provider this week.  Today in the ER patient was febrile, tachycardic, and requiring 2 L nasal cannula.  CTA chest was done that showed an acute PE with evidence of right heart strain consistent with submassive PE.  There was a new right solid pulmonary nodule which will need outpatient follow-up.  She has a stable left thyroid goiter.  CT of the abdomen pelvis showed cholelithiasis without complicating factors.  Pulmonary was consulted and recommended starting heparin drip despite history of brain bleed and then getting a neuroconsult to further sort that out.  Due to patient being febrile in the ER UA was done that was not indicative of UTI.  Blood cultures pending.  Chest x-ray shows no evidence of acute disease.  Patient is COVID-positive.  At the time of my exam, patient is able to say she is not currently in  any pain, she has no further complaints.   Clinical Impression  Patient functioning near baseline for functional mobility and gait other than requiring use of RW for gait training due to generalized weakness, normally use SPC and demonstrates good return for bed mobility, transfers and walking in room using RW.  PLAN:  Patient to be discharged home today and discharged from acute physical therapy to care of nursing for ambulation as tolerated for length of stay with recommendations stated below          Assistance Recommended at Discharge Set up Supervision/Assistance  If plan is discharge home, recommend the following:  Can travel by private vehicle  A little help with walking and/or transfers;A little help with bathing/dressing/bathroom;Help with stairs or ramp for entrance;Assistance with cooking/housework        Equipment Recommendations Rolling walker (2 wheels)  Recommendations for Other Services       Functional Status Assessment Patient has had a recent decline in their functional status and demonstrates the ability to make significant improvements in function in a reasonable and predictable amount of time.     Precautions / Restrictions Precautions Precautions: Fall Restrictions Weight Bearing Restrictions: No      Mobility  Bed Mobility Overal bed mobility: Modified Independent                  Transfers Overall transfer level: Modified independent  Ambulation/Gait Ambulation/Gait assistance: Supervision, Modified independent (Device/Increase time) Gait Distance (Feet): 40 Feet Assistive device: Rolling walker (2 wheels) Gait Pattern/deviations: Decreased step length - left, Decreased stance time - right, Decreased stride length Gait velocity: decreased     General Gait Details: slightly labored cadence using RW with good return for ambulating in room without loss of balance  Stairs            Wheelchair  Mobility     Tilt Bed    Modified Rankin (Stroke Patients Only)       Balance Overall balance assessment: Needs assistance Sitting-balance support: Feet supported, No upper extremity supported Sitting balance-Leahy Scale: Good Sitting balance - Comments: seated at EOB   Standing balance support: During functional activity, Reliant on assistive device for balance, Bilateral upper extremity supported Standing balance-Leahy Scale: Fair Standing balance comment: fair/good using RW                             Pertinent Vitals/Pain Pain Assessment Pain Assessment: No/denies pain    Home Living Family/patient expects to be discharged to:: Private residence Living Arrangements: Children Available Help at Discharge: Family;Available 24 hours/day Type of Home: House Home Access: Stairs to enter Entrance Stairs-Rails: Right;Left;Can reach both Entrance Stairs-Number of Steps: 1   Home Layout: One level Home Equipment: BSC/3in1;Grab bars - tub/shower;Shower seat;Wheelchair - Forensic psychologist (2 wheels);Other (comment) Additional Comments: Patient states RW at home is old and worn out    Prior Function Prior Level of Function : Needs assist       Physical Assist : Mobility (physical);ADLs (physical) Mobility (physical): Bed mobility;Transfers;Gait;Stairs   Mobility Comments: Household and short distanced community ambulator using SPC ADLs Comments: assisted by family     Hand Dominance   Dominant Hand: Right    Extremity/Trunk Assessment   Upper Extremity Assessment Upper Extremity Assessment: Overall WFL for tasks assessed    Lower Extremity Assessment Lower Extremity Assessment: Generalized weakness    Cervical / Trunk Assessment Cervical / Trunk Assessment: Normal  Communication   Communication: No difficulties  Cognition Arousal/Alertness: Awake/alert Behavior During Therapy: WFL for tasks assessed/performed Overall Cognitive Status: Within  Functional Limits for tasks assessed                                          General Comments      Exercises     Assessment/Plan    PT Assessment All further PT needs can be met in the next venue of care  PT Problem List Decreased strength;Decreased activity tolerance;Decreased balance;Decreased mobility       PT Treatment Interventions      PT Goals (Current goals can be found in the Care Plan section)  Acute Rehab PT Goals Patient Stated Goal: return home with family to assist PT Goal Formulation: With patient Time For Goal Achievement: 10/06/22 Potential to Achieve Goals: Good    Frequency       Co-evaluation               AM-PAC PT "6 Clicks" Mobility  Outcome Measure Help needed turning from your back to your side while in a flat bed without using bedrails?: None Help needed moving from lying on your back to sitting on the side of a flat bed without using bedrails?: None Help needed moving to and from a bed  to a chair (including a wheelchair)?: None Help needed standing up from a chair using your arms (e.g., wheelchair or bedside chair)?: A Little Help needed to walk in hospital room?: A Little Help needed climbing 3-5 steps with a railing? : A Little 6 Click Score: 21    End of Session   Activity Tolerance: Patient tolerated treatment well;Patient limited by fatigue Patient left: in chair;with call bell/phone within reach Nurse Communication: Mobility status PT Visit Diagnosis: Unsteadiness on feet (R26.81);Other abnormalities of gait and mobility (R26.89);Muscle weakness (generalized) (M62.81)    Time: 9629-5284 PT Time Calculation (min) (ACUTE ONLY): 22 min   Charges:   PT Evaluation $PT Eval Moderate Complexity: 1 Mod PT Treatments $Therapeutic Activity: 8-22 mins PT General Charges $$ ACUTE PT VISIT: 1 Visit         12:01 PM, 10/06/22 Ocie Bob, MPT Physical Therapist with [facility] 336  [phone] office [phone] mobile phone

## 2022-10-06 NOTE — Progress Notes (Signed)
Patient ambulated in room with this nurse and front wheel walker. On room air, patient maintained O2 sats 97-100%. Patient ambulated approximately 39ft. No SOB reported by patient. Dr. Mariea Clonts made aware.

## 2022-10-06 NOTE — Care Management Important Message (Signed)
Important Message  Patient Details  Name: TANAYSIA BHARDWAJ MRN: [id number] Date of Birth: Sep 04, 1934   Medicare Important Message Given:  Yes (spoke with daughter Joellyn Rued at [phone] to review letter. copy will be provided)     Corey Harold 10/06/2022, 2:59 PM

## 2022-10-06 NOTE — Plan of Care (Signed)

## 2022-10-06 NOTE — Discharge Summary (Signed)
[name], is a 88 y.o. female  DOB [birth year]  MRN [id number].  Admission date:  10/02/2022  Admitting Physician  Lilyan Gilford, DO  Discharge Date:  10/06/2022   Primary MD  Anabel Halon, MD  Recommendations for primary care physician for things to follow:   1)Stop Plavix/Clopidogrel---  2)Take Eliquis/Apixaban as prescribed for blood clots in your legs or your lungs 3)Avoid ibuprofen/Advil/Aleve/Motrin/Goody Powders/Naproxen/BC powders/Meloxicam/Diclofenac/Indomethacin and other Nonsteroidal anti-inflammatory medications as these will make you more likely to bleed and can cause stomach ulcers, can also cause Kidney problems.   Admission Diagnosis  Acute pulmonary embolism (HCC) [I26.99] Acute pulmonary embolism with acute cor pulmonale, unspecified pulmonary embolism type (HCC) [I26.09]   Discharge Diagnosis  Acute pulmonary embolism (HCC) [I26.99] Acute pulmonary embolism with acute cor pulmonale, unspecified pulmonary embolism type (HCC) [I26.09]    Principal Problem:   Acute pulmonary embolism (HCC) Active Problems:   Essential hypertension   Mixed hyperlipidemia   Acute kidney injury superimposed on chronic kidney disease (HCC)   COVID-19 virus infection   Acute metabolic encephalopathy      Past Medical History:  Diagnosis Date   Allergy    Arthritis    Colon cancer (HCC)    colon ca dx 07/30/09   History of cardiac monitoring 07/2017   "Event monitor demonstrated sinus rhythm with isolated PACs and no arrhythmias"   History of colon cancer 06/2009   found at time of TCS 06/29/09, 1.2cm sessile cecal polyp, no adjuvent therapy needed   HTN (hypertension)    Hx of cardiovascular stress test 07/2017   "No diagnostic ST segment changes to indicate ischemia. Small, moderate intensity, reversible apical to basal inferolateral defect consistent with ischemia. This is a  low risk study. Nuclear stress EF: 84%."   Hyperlipidemia    Hypothyroidism    PE (pulmonary thromboembolism) (HCC)    Renal disorder    cyst on kidney    Silent micro-hemorrhage of brain (HCC) 11/25/2018   Stroke [facility])    TIA (transient ischemic attack) 11/25/2018   Vertigo     Past Surgical History:  Procedure Laterality Date   ABDOMINAL HYSTERECTOMY     COLON SURGERY  07/2009   right hemicolectomy, no residual colon cancer on path   COLONOSCOPY  07/16/2010   WUJ:[id number] POLYP-TCS 3 YEARS   COLONOSCOPY N/A 08/02/2013   hyperplastic polyps, surveillance in 2020 if benefits outweight the risks   COLONOSCOPY  06/2009   1.2 cm sessile cecal polyp which had adenocarcinoma arising in a tubular adenoma.   PARTIAL THYMECTOMY     partial thyroidectomy     benign tumors       HPI  from the history and physical done on the day of admission:     HPI: [name] is a 88 y.o. female with medical history significant of hypertension, hyperlipidemia, hypothyroidism, DVT/PE, stroke, silent microhemorrhage of brain, and more presents the ED with a chief complaint of altered mental status.  Patient is typically alert and oriented x  4.  She has been subtly confused.  For example, she is not able to say which medication she took today.  She is unsure why she is in the hospital and thinks it has to do with her legs.  She was recently seen for leg pain and suspected DVT in the lower extremities.  When asked to elaborate patient says she is not in any pain today and she does not remember having been in with pain in her legs.  A few days ago she had a ultrasound unilateral left lower extremity that showed a subacute to chronic appearing nonocclusive deep vein thrombosis.  Patient was not on anticoagulation due to history of brain bleed.  She was supposed to have follow-up with her outpatient provider this week.  Today in the ER patient was febrile, tachycardic, and requiring 2 L nasal cannula.   CTA chest was done that showed an acute PE with evidence of right heart strain consistent with submassive PE.  There was a new right solid pulmonary nodule which will need outpatient follow-up.  She has a stable left thyroid goiter.  CT of the abdomen pelvis showed cholelithiasis without complicating factors.  Pulmonary was consulted and recommended starting heparin drip despite history of brain bleed and then getting a neuroconsult to further sort that out.  Due to patient being febrile in the ER UA was done that was not indicative of UTI.  Blood cultures pending.  Chest x-ray shows no evidence of acute disease.  Patient is COVID-positive.  At the time of my exam, patient is able to say she is not currently in any pain, she has no further complaints.   Patient was made full code as her advance directives give specific scenarios about patient remaining in a vegetative state or having some terminal illness then she would want treatment withdrawn, but she does not specifically have a DNR. Review of Systems: unable to review all systems due to the inability of the patient to answer questions.   Hospital Course:     1) Acute pulmonary embolism with concerns for right heart strain -Patient with history of colon malignancy and prior recurrent DVTs ---2D echo demonstrating grade 1 diastolic dysfunction, preserved ejection fraction, moderate elevated pulmonary artery systolic pressure with a right ventricle normal systolic function.  -Treated with IV heparin =-Discharge on Eliquis -Patient reminded to stop Plavix while on Eliquis -Patient may need lifelong anticoagulation   Acute metabolic encephalopathy --As per daughter [name] patient's mentation appears to be back to baseline   COVID-19 virus infection - COVID-positive -Treated with Solu-Medrol discharged on prednisone -Continue vitamin C and zinc -No oxygen requirement at this time   Acute kidney injury superimposed on chronic kidney disease  (HCC) - Creatinine increased from 1.11>> 1.64 -Creatinine trending back down currently 1.38 -renally adjust medications, avoid nephrotoxic agents / dehydration  / hypotension     Mixed hyperlipidemia - Continue Zetia and Crestor -Heart healthy diet discussed with patient.   Essential hypertension -Continue current antihypertensive regimen -Heart healthy diet discussed with patient.   Goals of care discussion: -Patient is DNR -will follow further GOC discussion regarding outpatient follow up with palliative care -continue current treatment.    Prior history of seizure -Continue Keppra -No seizure activity appreciated.   History of colon cancer -Outpatient follow-up with GI -Status post surgical resection in 2011.   Discharge Condition: stable  Follow UP   Follow-up Information     Anabel Halon, MD Follow up in 1 month(s).  Specialty: Internal Medicine Contact information: [address] [place] [address] [phone]                Diet and Activity recommendation:  As advised  Discharge Instructions    Discharge Instructions     Call MD for:  difficulty breathing, headache or visual disturbances   Complete by: As directed    Call MD for:  persistant dizziness or light-headedness   Complete by: As directed    Call MD for:  persistant nausea and vomiting   Complete by: As directed    Call MD for:  temperature >100.4   Complete by: As directed    Diet - low sodium heart healthy   Complete by: As directed    Discharge instructions   Complete by: As directed    1)Stop Plavix/Clopidogrel---  2)Take Eliquis/Apixaban as prescribed for blood clots in your legs or your lungs 3)Avoid ibuprofen/Advil/Aleve/Motrin/Goody Powders/Naproxen/BC powders/Meloxicam/Diclofenac/Indomethacin and other Nonsteroidal anti-inflammatory medications as these will make you more likely to bleed and can cause stomach ulcers, can also cause Kidney problems.   Increase  activity slowly   Complete by: As directed        Discharge Medications     Allergies as of 10/06/2022       Reactions   Tape Rash   Zio monitor adhesive causes ulcerated and infected skin .Had to see derm        Medication List     STOP taking these medications    clopidogrel 75 MG tablet Commonly known as: PLAVIX       TAKE these medications    acetaminophen 500 MG tablet Commonly known as: TYLENOL Take 500 mg by mouth every 8 (eight) hours as needed for mild pain or headache.   Eliquis DVT/PE Starter Pack Generic drug: Apixaban Starter Pack (10mg  and 5mg ) Take as directed on package: start with two-5mg  tablets twice daily for 7 days. On day 8, switch to one-5mg  tablet twice daily.   apixaban 5 MG Tabs tablet Commonly known as: ELIQUIS Take 1 tablet (5 mg total) by mouth 2 (two) times daily. Start after completing starter pack Start taking on: November 04, 2022   ascorbic acid 250 MG Chew Commonly known as: VITAMIN C Chew 250 mg by mouth daily.   ASPERCREME LIDOCAINE EX Apply 1 application topically daily as needed (for knee pain).   cholecalciferol 25 MCG (1000 UNIT) tablet Commonly known as: VITAMIN D3 Take 1,000 Units by mouth daily.   diltiazem 180 MG 24 hr capsule Commonly known as: CARDIZEM CD Take 1 capsule (180 mg total) by mouth daily.   diltiazem 30 MG tablet Commonly known as: CARDIZEM Take 30 mg by mouth every 8 (eight) hours as needed (Palpitations).   ezetimibe 10 MG tablet Commonly known as: ZETIA Take 1 tablet (10 mg total) by mouth daily.   gabapentin 300 MG capsule Commonly known as: NEURONTIN Take 300 mg by mouth 3 (three) times daily.   levETIRAcetam 500 MG tablet Commonly known as: KEPPRA Take 250 mg by mouth 2 (two) times daily.   Linzess 72 MCG capsule Generic drug: linaclotide TAKE (1) CAPSULE BY MOUTH DAILY AS NEEDED FOR CONSTIPATION. What changed: See the new instructions.   loratadine 10 MG tablet Commonly known  as: CLARITIN Take 10 mg by mouth daily.   meclizine 25 MG tablet Commonly known as: ANTIVERT Take 1 tablet (25 mg total) by mouth 3 (three) times daily as needed for dizziness.   metoprolol succinate 100  MG 24 hr tablet Commonly known as: TOPROL-XL TAKE 1 TABLET BY MOUTH INTHE MORNING AND AT BEDTIME. TAKE WITH OR IMMEDIATELY FOLLOWING A MEAL. What changed: See the new instructions.   pantoprazole 40 MG tablet Commonly known as: Protonix Take 1 tablet (40 mg total) by mouth daily.   predniSONE 20 MG tablet Commonly known as: DELTASONE Take 2 tablets (40 mg total) by mouth daily with breakfast for 5 days. What changed: how much to take   rosuvastatin 40 MG tablet Commonly known as: CRESTOR TAKE ONE TABLET BY MOUTH ONCE DAILY.   topiramate 50 MG tablet Commonly known as: TOPAMAX Take 50 mg by mouth 2 (two) times daily.   traMADol 50 MG tablet Commonly known as: ULTRAM Take 0.5-1 tablets (25-50 mg total) by mouth every 12 (twelve) hours as needed for severe pain.   Xiidra 5 % Soln Generic drug: Lifitegrast Apply 1 drop to eye daily as needed (dry eyes).               Durable Medical Equipment  (From admission, onward)           Start     Ordered   10/06/22 1006  For home use only DME Walker rolling  Once       Comments: Patient unsteady on feet and at risk for falls walking without AD, safer using RW with good return for use demonstrated  Question Answer Comment  Walker: With 5 Inch Wheels   Patient needs a walker to treat with the following condition Gait difficulty      10/06/22 1007          Major procedures and Radiology Reports - PLEASE review detailed and final reports for all details, in brief -   ECHOCARDIOGRAM COMPLETE  Result Date: 10/03/2022    ECHOCARDIOGRAM REPORT   Patient Name:   [name] Date of Exam: 10/03/2022 Medical Rec #:  [id number]              Height:       64.0 in Accession #:    [id number]             Weight:        150.4 lb Date of Birth:  1934/11/17              BSA:          1.733 m Patient Age:    87 years               BP:           175/72 mmHg Patient Gender: F                      HR:           66 bpm. [facility] Procedure: 2D Echo, Cardiac Doppler and Color Doppler Indications:    I26.02 Pulmonary embolus  History:        Patient has prior history of Echocardiogram examinations, most                 recent 08/03/2021. Abnormal ECG, TIA, Arrythmias:PVC,                 Signs/Symptoms:Dizziness/Lightheadedness; Risk                 Factors:Hypertension and Dyslipidemia. Covid positive.  Sonographer:    Sheralyn Boatman RDCS Referring Phys: [id number] ASIA B ZIERLE-GHOSH IMPRESSIONS  1. Left ventricular ejection fraction,  by estimation, is 60 to 65%. The left ventricle has normal function. The left ventricle has no regional wall motion abnormalities. Left ventricular diastolic parameters are consistent with Grade I diastolic dysfunction (impaired relaxation).  2. Right ventricular systolic function is normal. The right ventricular size is normal. There is moderately elevated pulmonary artery systolic pressure.  3. The mitral valve is normal in structure. Trivial mitral valve regurgitation. No evidence of mitral stenosis.  4. Tricuspid valve regurgitation is moderate.  5. The aortic valve is tricuspid. Aortic valve regurgitation is not visualized. FINDINGS  Left Ventricle: Left ventricular ejection fraction, by estimation, is 60 to 65%. The left ventricle has normal function. The left ventricle has no regional wall motion abnormalities. The left ventricular internal cavity size was normal in size. There is  no left ventricular hypertrophy. Left ventricular diastolic parameters are consistent with Grade I diastolic dysfunction (impaired relaxation). Right Ventricle: The right ventricular size is normal. No increase in right ventricular wall thickness. Right ventricular systolic function is normal. There is moderately  elevated pulmonary artery systolic pressure. The tricuspid regurgitant velocity is 2.88 m/s, and with an assumed right atrial pressure of 15 mmHg, the estimated right ventricular systolic pressure is 48.2 mmHg. Left Atrium: Left atrial size was normal in size. Right Atrium: Right atrial size was normal in size. Pericardium: There is no evidence of pericardial effusion. Mitral Valve: The mitral valve is normal in structure. Trivial mitral valve regurgitation. No evidence of mitral valve stenosis. Tricuspid Valve: The tricuspid valve is normal in structure. Tricuspid valve regurgitation is moderate . No evidence of tricuspid stenosis. Aortic Valve: The aortic valve is tricuspid. Aortic valve regurgitation is not visualized. Pulmonic Valve: The pulmonic valve was normal in structure. Pulmonic valve regurgitation is mild. No evidence of pulmonic stenosis. Aorta: The aortic root and ascending aorta are structurally normal, with no evidence of dilitation. IAS/Shunts: No atrial level shunt detected by color flow Doppler.  LEFT VENTRICLE PLAX 2D LVIDd:         3.50 cm     Diastology LVIDs:         2.40 cm     LV e' medial:    4.35 cm/s LV PW:         1.10 cm     LV E/e' medial:  13.5 LV IVS:        1.00 cm     LV e' lateral:   5.66 cm/s LVOT diam:     2.10 cm     LV E/e' lateral: 10.4 LV SV:         52 LV SV Index:   30 LVOT Area:     3.46 cm  LV Volumes (MOD) LV vol d, MOD A2C: 51.6 ml LV vol d, MOD A4C: 50.8 ml LV vol s, MOD A2C: 19.3 ml LV vol s, MOD A4C: 18.1 ml LV SV MOD A2C:     32.3 ml LV SV MOD A4C:     50.8 ml LV SV MOD BP:      33.8 ml RIGHT VENTRICLE             IVC RV S prime:     10.90 cm/s  IVC diam: 1.90 cm TAPSE (M-mode): 2.0 cm LEFT ATRIUM             Index        RIGHT ATRIUM           Index LA diam:        3.30  cm 1.90 cm/m   RA Area:     12.60 cm LA Vol (A2C):   32.1 ml 18.52 ml/m  RA Volume:   28.50 ml  16.45 ml/m LA Vol (A4C):   15.6 ml 9.00 ml/m LA Biplane Vol: 22.2 ml 12.81 ml/m  AORTIC VALVE              PULMONIC VALVE LVOT Vmax:   78.00 cm/s  PV Vmax:          1.85 m/s LVOT Vmean:  49.300 cm/s PV Peak grad:     13.7 mmHg LVOT VTI:    0.150 m     PR End Diast Vel: 1.81 msec  AORTA Ao Root diam: 3.30 cm Ao Asc diam:  3.40 cm MITRAL VALVE                TRICUSPID VALVE MV Area (PHT): 3.31 cm     TR Peak grad:   33.2 mmHg MV Decel Time: 229 msec     TR Vmax:        288.00 cm/s MV E velocity: 58.70 cm/s MV A velocity: 108.00 cm/s  SHUNTS MV E/A ratio:  0.54         Systemic VTI:  0.15 m                             Systemic Diam: 2.10 cm Dietrich Pates MD Electronically signed by Dietrich Pates MD Signature Date/Time: 10/03/2022/3:38:27 PM    Final    CT Angio Chest PE W and/or Wo Contrast  Result Date: 10/02/2022 CLINICAL DATA:  Left leg pain and possible sepsis, initial encounter EXAM: CT ANGIOGRAPHY CHEST CT ABDOMEN AND PELVIS WITH CONTRAST TECHNIQUE: Multidetector CT imaging of the chest was performed using the standard protocol during bolus administration of intravenous contrast. Multiplanar CT image reconstructions and MIPs were obtained to evaluate the vascular anatomy. Multidetector CT imaging of the abdomen and pelvis was performed using the standard protocol during bolus administration of intravenous contrast. RADIATION DOSE REDUCTION: This exam was performed according to the departmental dose-optimization program which includes automated exposure control, adjustment of the mA and/or kV according to patient size and/or use of iterative reconstruction technique. CONTRAST:  60mL OMNIPAQUE IOHEXOL 350 MG/ML SOLN COMPARISON:  Chest x-ray from earlier in the same day. FINDINGS: CTA CHEST FINDINGS Cardiovascular: Thoracic aorta shows mild atherosclerotic calcifications. No aneurysmal dilatation or dissection is noted. The heart is mildly enlarged in size. Coronary calcifications are noted. The pulmonary artery shows a normal branching pattern bilaterally. Filling defects are identified within the pulmonary  artery bilaterally consistent with pulmonary emboli. RV/LV ratio is elevated at 1.25 indicating right heart strain. Mediastinum/Nodes: Thoracic inlet shows significant enlargement of the left lobe of the thyroid consistent with thyroid goiter. This is stable in appearance from a prior exam from 2023. This has been previously biopsied and does not require follow-up examination. No hilar or mediastinal adenopathy is noted. The esophagus is within normal limits. Lungs/Pleura: Lungs are well aerated bilaterally. In the posterior right upper lobe, there is a nodular density which measures 14 mm in greatest dimension and demonstrates some retraction along the major fissure. No other nodule is identified. No effusion is seen. Musculoskeletal: Mild degenerative changes of the thoracic spine are noted. Review of the MIP images confirms the above findings. CT ABDOMEN and PELVIS FINDINGS Hepatobiliary: Gallbladder is well distended with multiple small dependent gallstones. Liver demonstrates a single cyst in the  right lobe stable from the prior exam. Pancreas: Unremarkable. No pancreatic ductal dilatation or surrounding inflammatory changes. Spleen: Normal in size without focal abnormality. Adrenals/Urinary Tract: Adrenal glands are within normal limits. Kidneys show multiple renal cysts stable from the prior exam. No further follow-up is recommended. No renal calculi or obstructive changes are seen. Normal excretion is noted on delayed images. The bladder is within normal limits. Stomach/Bowel: Scattered fecal material is noted throughout the colon. No obstructive changes are noted. Changes consistent with prior right partial colectomy are seen. No obstructive changes are noted. Small bowel is within normal limits. Stomach is unremarkable. Vascular/Lymphatic: Aortic atherosclerosis. No enlarged abdominal or pelvic lymph nodes. Reproductive: Status post hysterectomy. No adnexal masses. Other: No abdominal wall hernia or  abnormality. No abdominopelvic ascites. Musculoskeletal: No acute or significant osseous findings. Review of the MIP images confirms the above findings. IMPRESSION: CTA of the chest: Positive for acute PE with CT evidence of right heart strain (RV/LV Ratio = 1.25) consistent with at least submassive (intermediate risk) PE. The presence of right heart strain has been associated with an increased risk of morbidity and mortality. Please refer to the "Code PE Focused" order set in EPIC. New right solid pulmonary nodule within the upper lobe measuring 14 mm. Per Fleischner Society Guidelines, consider a non-contrast Chest CT at 3 months, a PET/CT, or tissue sampling. These guidelines do not apply to immunocompromised patients and patients with cancer. Follow up in patients with significant comorbidities as clinically warranted. For lung cancer screening, adhere to Lung-RADS guidelines. Reference: Radiology. 2017; 284(1):228-43. Stable left thyroid goiter. No follow-up is recommended as this has been previously biopsied. CT of the abdomen and pelvis: Cholelithiasis without complicating factors. No acute abnormality noted. Critical Value/emergent results were called by telephone at the time of interpretation on 10/02/2022 at 10:51 pm to Dr. Benjiman Core , who verbally acknowledged these results. Electronically Signed   By: Alcide Clever M.D.   On: 10/02/2022 22:53   CT ABDOMEN PELVIS W CONTRAST  Result Date: 10/02/2022 CLINICAL DATA:  Left leg pain and possible sepsis, initial encounter EXAM: CT ANGIOGRAPHY CHEST CT ABDOMEN AND PELVIS WITH CONTRAST TECHNIQUE: Multidetector CT imaging of the chest was performed using the standard protocol during bolus administration of intravenous contrast. Multiplanar CT image reconstructions and MIPs were obtained to evaluate the vascular anatomy. Multidetector CT imaging of the abdomen and pelvis was performed using the standard protocol during bolus administration of intravenous  contrast. RADIATION DOSE REDUCTION: This exam was performed according to the departmental dose-optimization program which includes automated exposure control, adjustment of the mA and/or kV according to patient size and/or use of iterative reconstruction technique. CONTRAST:  60mL OMNIPAQUE IOHEXOL 350 MG/ML SOLN COMPARISON:  Chest x-ray from earlier in the same day. FINDINGS: CTA CHEST FINDINGS Cardiovascular: Thoracic aorta shows mild atherosclerotic calcifications. No aneurysmal dilatation or dissection is noted. The heart is mildly enlarged in size. Coronary calcifications are noted. The pulmonary artery shows a normal branching pattern bilaterally. Filling defects are identified within the pulmonary artery bilaterally consistent with pulmonary emboli. RV/LV ratio is elevated at 1.25 indicating right heart strain. Mediastinum/Nodes: Thoracic inlet shows significant enlargement of the left lobe of the thyroid consistent with thyroid goiter. This is stable in appearance from a prior exam from 2023. This has been previously biopsied and does not require follow-up examination. No hilar or mediastinal adenopathy is noted. The esophagus is within normal limits. Lungs/Pleura: Lungs are well aerated bilaterally. In the posterior right upper lobe, there  is a nodular density which measures 14 mm in greatest dimension and demonstrates some retraction along the major fissure. No other nodule is identified. No effusion is seen. Musculoskeletal: Mild degenerative changes of the thoracic spine are noted. Review of the MIP images confirms the above findings. CT ABDOMEN and PELVIS FINDINGS Hepatobiliary: Gallbladder is well distended with multiple small dependent gallstones. Liver demonstrates a single cyst in the right lobe stable from the prior exam. Pancreas: Unremarkable. No pancreatic ductal dilatation or surrounding inflammatory changes. Spleen: Normal in size without focal abnormality. Adrenals/Urinary Tract: Adrenal  glands are within normal limits. Kidneys show multiple renal cysts stable from the prior exam. No further follow-up is recommended. No renal calculi or obstructive changes are seen. Normal excretion is noted on delayed images. The bladder is within normal limits. Stomach/Bowel: Scattered fecal material is noted throughout the colon. No obstructive changes are noted. Changes consistent with prior right partial colectomy are seen. No obstructive changes are noted. Small bowel is within normal limits. Stomach is unremarkable. Vascular/Lymphatic: Aortic atherosclerosis. No enlarged abdominal or pelvic lymph nodes. Reproductive: Status post hysterectomy. No adnexal masses. Other: No abdominal wall hernia or abnormality. No abdominopelvic ascites. Musculoskeletal: No acute or significant osseous findings. Review of the MIP images confirms the above findings. IMPRESSION: CTA of the chest: Positive for acute PE with CT evidence of right heart strain (RV/LV Ratio = 1.25) consistent with at least submassive (intermediate risk) PE. The presence of right heart strain has been associated with an increased risk of morbidity and mortality. Please refer to the "Code PE Focused" order set in EPIC. New right solid pulmonary nodule within the upper lobe measuring 14 mm. Per Fleischner Society Guidelines, consider a non-contrast Chest CT at 3 months, a PET/CT, or tissue sampling. These guidelines do not apply to immunocompromised patients and patients with cancer. Follow up in patients with significant comorbidities as clinically warranted. For lung cancer screening, adhere to Lung-RADS guidelines. Reference: Radiology. 2017; 284(1):228-43. Stable left thyroid goiter. No follow-up is recommended as this has been previously biopsied. CT of the abdomen and pelvis: Cholelithiasis without complicating factors. No acute abnormality noted. Critical Value/emergent results were called by telephone at the time of interpretation on 10/02/2022 at  10:51 pm to Dr. Benjiman Core , who verbally acknowledged these results. Electronically Signed   By: Alcide Clever M.D.   On: 10/02/2022 22:53   DG Chest Portable 1 View  Result Date: 10/02/2022 CLINICAL DATA:  Shortness of breath EXAM: PORTABLE CHEST 1 VIEW COMPARISON:  Chest x-ray 02/12/2022 FINDINGS: The heart size and mediastinal contours are within normal limits. Both lungs are clear. The visualized skeletal structures are unremarkable. IMPRESSION: No active disease. Electronically Signed   By: Darliss Cheney M.D.   On: 10/02/2022 20:38   US Venous Img Lower Unilateral Left  Result Date: 09/30/2022 CLINICAL DATA:  Eighty-[age] extremity EXAM: LEFT LOWER EXTREMITY VENOUS DOPPLER ULTRASOUND TECHNIQUE: Gray-scale sonography with graded compression, as well as color Doppler and duplex ultrasound were performed to evaluate the left lower extremity deep venous systems from the level of the common femoral vein and including the common femoral, femoral, profunda femoral, popliteal and calf veins including the posterior tibial, peroneal and gastrocnemius veins when visible. Spectral Doppler was utilized to evaluate flow at rest and with distal augmentation maneuvers in the common femoral, femoral and popliteal veins. The contralateral common femoral vein was also evaluated for comparison. COMPARISON:  11/09/2020 FINDINGS: LEFT LOWER EXTREMITY Common Femoral Vein: No evidence of thrombus. Normal compressibility,  respiratory phasicity and response to augmentation. Central Greater Saphenous Vein: No evidence of thrombus. Normal compressibility and flow on color Doppler imaging. Central Profunda Femoral Vein: No evidence of thrombus. Normal compressibility and flow on color Doppler imaging. Femoral Vein: No evidence of thrombus. Normal compressibility, respiratory phasicity and response to augmentation. Popliteal Vein: No evidence of thrombus. Normal compressibility, respiratory phasicity and response to  augmentation. Calf Veins: No evidence of thrombus. Normal compressibility and flow on color Doppler imaging. Venous Reflux:  None. Other Findings:  None. RIGHT LOWER EXTREMITY Common Femoral Vein: No evidence of thrombus. Normal compressibility, respiratory phasicity and response to augmentation. There is subacute to chronic appearing nonocclusive deep vein thrombosis within central profundal femoral vein. IMPRESSION: 1. No evidence of left lower extremity deep venous thrombosis. 2. Subacute to chronic appearing nonocclusive deep vein thrombosis within central RIGHT profunda femoral vein. Consider dedicated right lower extremity venous duplex for further characterization as clinically indicated. Marliss Coots, MD [facility] Radiology Electronically Signed   By: Marliss Coots M.D.   On: 09/30/2022 16:54   DG Knee Left Port  Result Date: 09/30/2022 CLINICAL DATA:  144615 Pain 144615 EXAM: PORTABLE LEFT KNEE - 1-2 VIEW COMPARISON:  None Available. FINDINGS: Four views of the left knee. No evidence of fracture, dislocation, or joint effusion. Mild degenerative changes in the medial tibiofemoral and patellofemoral compartments. Soft tissues are unremarkable. IMPRESSION: Mild degenerative changes in the medial tibiofemoral and patellofemoral compartments. Electronically Signed   By: Orvan Falconer M.D.   On: 09/30/2022 14:07    Micro Results    Recent Results (from the past 240 hour(s))  Culture, blood (routine x 2)     Status: None (Preliminary result)   Collection Time: 10/02/22  8:00 PM   Specimen: Right Antecubital; Blood  Result Value Ref Range Status   Specimen Description RIGHT ANTECUBITAL  Final   Special Requests   Final    BOTTLES DRAWN AEROBIC AND ANAEROBIC Blood Culture results may not be optimal due to an excessive volume of blood received in culture bottles   Culture   Final    NO GROWTH 4 DAYS Performed at [facility], [address], [place], [place] [address]    Report Status PENDING  Incomplete  Culture, blood (routine x 2)     Status: None (Preliminary result)   Collection Time: 10/02/22  8:24 PM   Specimen: Right Antecubital; Blood  Result Value Ref Range Status   Specimen Description RIGHT ANTECUBITAL  Final   Special Requests   Final    BOTTLES DRAWN AEROBIC AND ANAEROBIC Blood Culture adequate volume   Culture   Final    NO GROWTH 4 DAYS Performed at [facility], [address] [place], [place] [address]    Report Status PENDING  Incomplete  Resp panel by RT-PCR (RSV, Flu A&B, Covid) Anterior Nasal Swab     Status: Abnormal   Collection Time: 10/02/22  9:02 PM   Specimen: Anterior Nasal Swab  Result Value Ref Range Status   SARS Coronavirus 2 by RT PCR POSITIVE (A) NEGATIVE Final    Comment: (NOTE) SARS-CoV-2 target nucleic acids are DETECTED.  The SARS-CoV-2 RNA is generally detectable in upper respiratory specimens during the acute phase of infection. Positive results are indicative of the presence of the identified virus, but do not rule out bacterial infection or co-infection with other pathogens not detected by the test. Clinical correlation with patient history and other diagnostic information is necessary to  determine patient infection status. The expected result is Negative.  Fact Sheet for Patients: BloggerCourse.com  Fact Sheet for Healthcare Providers: SeriousBroker.it  This test is not yet approved or cleared by the [place] FDA and  has been authorized for detection and/or diagnosis of SARS-CoV-2 by FDA under an Emergency Use Authorization (EUA).  This EUA will remain in effect (meaning this test can be used) for the duration of  the COVID-19 declaration under Section 564(b)(1) of the A ct, 21 U.S.C. section 360bbb-3(b)(1), unless the authorization is terminated or revoked sooner.     Influenza A by PCR NEGATIVE NEGATIVE Final    Influenza B by PCR NEGATIVE NEGATIVE Final    Comment: (NOTE) The Xpert Xpress SARS-CoV-2/FLU/RSV plus assay is intended as an aid in the diagnosis of influenza from Nasopharyngeal swab specimens and should not be used as a sole basis for treatment. Nasal washings and aspirates are unacceptable for Xpert Xpress SARS-CoV-2/FLU/RSV testing.  Fact Sheet for Patients: BloggerCourse.com  Fact Sheet for Healthcare Providers: SeriousBroker.it  This test is not yet approved or cleared by the [place] FDA and has been authorized for detection and/or diagnosis of SARS-CoV-2 by FDA under an Emergency Use Authorization (EUA). This EUA will remain in effect (meaning this test can be used) for the duration of the COVID-19 declaration under Section 564(b)(1) of the Act, 21 U.S.C. section 360bbb-3(b)(1), unless the authorization is terminated or revoked.     Resp Syncytial Virus by PCR NEGATIVE NEGATIVE Final    Comment: (NOTE) Fact Sheet for Patients: BloggerCourse.com  Fact Sheet for Healthcare Providers: SeriousBroker.it  This test is not yet approved or cleared by the [place] FDA and has been authorized for detection and/or diagnosis of SARS-CoV-2 by FDA under an Emergency Use Authorization (EUA). This EUA will remain in effect (meaning this test can be used) for the duration of the COVID-19 declaration under Section 564(b)(1) of the Act, 21 U.S.C. section 360bbb-3(b)(1), unless the authorization is terminated or revoked.  Performed at [facility], [address] [place], [place] [address]   MRSA Next Gen by PCR, Nasal     Status: None   Collection Time: 10/03/22  2:13 AM   Specimen: Nasal Mucosa; Nasal Swab  Result Value Ref Range Status   MRSA by PCR Next Gen NOT DETECTED NOT DETECTED Final    Comment: (NOTE) The GeneXpert MRSA Assay (FDA approved for NASAL specimens  only), is one component of a comprehensive MRSA colonization surveillance program. It is not intended to diagnose MRSA infection nor to guide or monitor treatment for MRSA infections. Test performance is not FDA approved in patients less than 69 years old. Performed at [facility], [address] [place], [place] [address]    Today   Subjective    Demetric Quick today has no complaints -No fever  Or chills   No Nausea, Vomiting or Diarrhea -No significant dyspnea on exertion on ambulation, post ambulation O2 sats 95 to 96% on room air          Patient has been seen and examined prior to discharge   Objective   Blood pressure 132/67, pulse 63, temperature 98.2 F (36.8 C), temperature source Oral, resp. rate 17, height 5\' 4"  (1.626 m), weight 66.9 kg, SpO2 99%.   Intake/Output Summary (Last 24 hours) at 10/06/2022 1611 Last data filed at 10/06/2022 1500 Gross per 24 hour  Intake 200 ml  Output --  Net 200 ml    Exam Gen:- Awake Alert, no  acute distress  HEENT:- [place].AT, No sclera icterus Neck-Supple Neck,No JVD,.  Lungs-fair air movement, no wheezing CV- S1, S2 normal, regular Abd-  +ve B.Sounds, Abd Soft, No tenderness,    Extremity/Skin:- No  edema,   good pulses Psych-affect is appropriate, oriented x3 Neuro-no new focal deficits, no tremors    Data Review   CBC w Diff:  Lab Results  Component Value Date   WBC 14.0 (H) 10/06/2022   HGB 14.2 10/06/2022   HGB 14.0 02/07/2022   HGB 14.7 04/16/2015   HCT 44.2 10/06/2022   HCT 42.1 02/07/2022   HCT 44.2 04/16/2015   PLT 225 10/06/2022   PLT 279 02/07/2022   LYMPHOPCT 12 10/03/2022   LYMPHOPCT 32.4 04/16/2015   MONOPCT 11 10/03/2022   MONOPCT 6.3 04/16/2015   EOSPCT 2 10/03/2022   EOSPCT 2.7 04/16/2015   BASOPCT 0 10/03/2022   BASOPCT 1.1 04/16/2015    CMP:  Lab Results  Component Value Date   NA 137 10/05/2022   NA 145 (H) 08/06/2021   NA 144 04/16/2015   K 3.6 10/05/2022   K 3.9  04/16/2015   CL 108 10/05/2022   CL 105 04/02/2012   CO2 19 (L) 10/05/2022   CO2 29 04/16/2015   BUN 29 (H) 10/05/2022   BUN 21 08/06/2021   BUN 12.4 04/16/2015   CREATININE 1.38 (H) 10/05/2022   CREATININE 1.52 (H) 09/20/2019   CREATININE 1.2 (H) 04/16/2015   PROT 7.4 10/05/2022   PROT 6.9 12/08/2020   PROT 8.2 04/16/2015   ALBUMIN 3.4 (L) 10/05/2022   ALBUMIN 4.0 12/08/2020   ALBUMIN 4.0 04/16/2015   BILITOT 0.5 10/05/2022   BILITOT 0.6 12/08/2020   BILITOT 0.78 04/16/2015   ALKPHOS 74 10/05/2022   ALKPHOS 103 04/16/2015   AST 36 10/05/2022   AST 18 04/16/2015   ALT 47 (H) 10/05/2022   ALT 19 04/16/2015  .  Total Discharge time is about 33 minutes  Shon Hale M.D on 10/06/2022 at 4:11 PM  Go to [email or web address] -  for contact info  [facility] - Office  [phone]

## 2022-10-06 NOTE — Progress Notes (Signed)
Palliative: Chart review completed.  [name] has remained stable throughout her hospital stay.  She tested positive for COVID 7/21.  Thankfully, she has not experienced respiratory symptoms.  She lives in her own home and has been independent with ADLs/IADLs.  Her daughter maintains her own home, but spends the majority of her time at [name]'s home.  [name]'s goal is to return to her own home.  Conference with attending, bedside nursing staff, transition of care team related to patient condition, needs, goals of care, disposition.  Plan:   Continue to treat the treatable but no CPR or intubation.  Return home with home health if needed.     No charge  Lillia Carmel, NP Palliative medicine team Team phone [phone] Greater than 50% of this time was spent counseling and coordinating care related to the above assessment and plan.

## 2022-10-06 NOTE — TOC Transition Note (Signed)
Transition of Care [facility]) - CM/SW Discharge Note   Patient Details  Name: [name] MRN: [id number] Date of Birth: 05/13/1934  Transition of Care [facility]) CM/SW Contact:  Villa Herb, LCSWA Phone Number: 10/06/2022, 1:43 PM   Clinical Narrative:    TOC updated that PT is recommending HH PT for pt at D/C. CSW spoke with pts daughter who states that they are agreeable to [facility] PT and would like to use [facility] again. CSW spoke to [place] with [facility] who accepts the referral. TOC to request [facility] PT orders. CSW also spoke with daughter about rolling walker recommendation, she states this is needed and agreeable to [facility] providing. CSW spoke to [place] with [facility], referral accepted and DME order placed. Walker delivered to pts room. TOC signing off.   Final next level of care: Home w Home Health Services Barriers to Discharge: Barriers Resolved   Patient Goals and CMS Choice CMS Medicare.gov Compare Post Acute Care list provided to:: Patient Choice offered to / list presented to : Patient  Discharge Placement                         Discharge Plan and Services Additional resources added to the After Visit Summary for   In-house Referral: Clinical Social Work              DME Arranged: Dan Humphreys rolling DME Agency: [facility] Date DME Agency Contacted: 10/06/22   Representative spoke with at DME Agency: Marthann Schiller HH Arranged: PT HH Agency: [facility] ([facility] Date HH Agency Contacted: 10/06/22   Representative spoke with at [facility] Agency: Morrie Sheldon  Social Determinants of Health (SDOH) Interventions SDOH Screenings   Food Insecurity: No Food Insecurity (10/03/2022)  Housing: Low Risk  (10/03/2022)  Transportation Needs: No Transportation Needs (10/03/2022)  Utilities: Not At Risk (10/03/2022)  Alcohol Screen: Low Risk  (11/13/2020)  Depression (PHQ2-9): Low Risk  (09/13/2022)  Financial Resource Strain: Low Risk  (11/13/2020)  Physical Activity:  Insufficiently Active (11/22/2021)  Social Connections: Moderately Isolated (11/13/2020)  Stress: No Stress Concern Present (11/13/2020)  Tobacco Use: Low Risk  (10/04/2022)     Readmission Risk Interventions    10/03/2022    8:49 AM  Readmission Risk Prevention Plan  Transportation Screening Complete  Home Care Screening Complete  Medication Review (RN CM) Complete

## 2022-10-06 NOTE — Discharge Instructions (Signed)
1)Stop Plavix/Clopidogrel---  2)Take Eliquis/Apixaban as prescribed for blood clots in your legs or your lungs 3)Avoid ibuprofen/Advil/Aleve/Motrin/Goody Powders/Naproxen/BC powders/Meloxicam/Diclofenac/Indomethacin and other Nonsteroidal anti-inflammatory medications as these will make you more likely to bleed and can cause stomach ulcers, can also cause Kidney problems.

## 2022-10-06 NOTE — Progress Notes (Signed)
Daughter, [name], made aware of patient's pending discharge. Vickie to provide transportation for patient home. Instructions given on COVID-19 isolation per Dr. Fredirick Lathe recommendations. Instructed on Eliquis use in addition to Plavix patient already taking. Daughter verbalized understanding. Will review complete discharge instructions with daughter and patient upon daughter's arrival.

## 2022-10-07 ENCOUNTER — Telehealth: Payer: Self-pay

## 2022-10-07 DIAGNOSIS — Z85038 Personal history of other malignant neoplasm of large intestine: Secondary | ICD-10-CM | POA: Diagnosis not present

## 2022-10-07 DIAGNOSIS — Z556 Problems related to health literacy: Secondary | ICD-10-CM | POA: Diagnosis not present

## 2022-10-07 DIAGNOSIS — U071 COVID-19: Secondary | ICD-10-CM | POA: Diagnosis not present

## 2022-10-07 DIAGNOSIS — E049 Nontoxic goiter, unspecified: Secondary | ICD-10-CM | POA: Diagnosis not present

## 2022-10-07 DIAGNOSIS — I129 Hypertensive chronic kidney disease with stage 1 through stage 4 chronic kidney disease, or unspecified chronic kidney disease: Secondary | ICD-10-CM | POA: Diagnosis not present

## 2022-10-07 DIAGNOSIS — N189 Chronic kidney disease, unspecified: Secondary | ICD-10-CM | POA: Diagnosis not present

## 2022-10-07 DIAGNOSIS — M1990 Unspecified osteoarthritis, unspecified site: Secondary | ICD-10-CM | POA: Diagnosis not present

## 2022-10-07 DIAGNOSIS — I2699 Other pulmonary embolism without acute cor pulmonale: Secondary | ICD-10-CM | POA: Diagnosis not present

## 2022-10-07 DIAGNOSIS — Z86718 Personal history of other venous thrombosis and embolism: Secondary | ICD-10-CM | POA: Diagnosis not present

## 2022-10-07 DIAGNOSIS — G9341 Metabolic encephalopathy: Secondary | ICD-10-CM | POA: Diagnosis not present

## 2022-10-07 DIAGNOSIS — E782 Mixed hyperlipidemia: Secondary | ICD-10-CM | POA: Diagnosis not present

## 2022-10-07 DIAGNOSIS — Z7952 Long term (current) use of systemic steroids: Secondary | ICD-10-CM | POA: Diagnosis not present

## 2022-10-07 DIAGNOSIS — E039 Hypothyroidism, unspecified: Secondary | ICD-10-CM | POA: Diagnosis not present

## 2022-10-07 DIAGNOSIS — Z7901 Long term (current) use of anticoagulants: Secondary | ICD-10-CM | POA: Diagnosis not present

## 2022-10-07 DIAGNOSIS — Z8673 Personal history of transient ischemic attack (TIA), and cerebral infarction without residual deficits: Secondary | ICD-10-CM | POA: Diagnosis not present

## 2022-10-07 NOTE — Consult Note (Signed)
[facility] Customer service manager [facility]) Accountable Care Organization (ACO) [facility] Liaison Note  10/07/2022  SHANEQUA LACKMAN [birth year] [id number]  Location: [facility] RN Hospital Liaison screened the patient remotely at [facility].  Insurance: [employer]   DUSTIE TACKE is a 88 y.o. female who is a Primary Care Patient of Allena Katz, Earlie Lou, MD. The patient was screened for readmission hospitalization with noted high risk score for unplanned readmission risk with 1 IP/1 ED in 6 months.  The patient was assessed for potential [facility]) Care Management service needs for post hospital transition for care coordination. Review of patient's electronic medical record reveals patient was admitted for acute pulmonary embolism. No needs anticipated however [facility] has been arranged for post follow up care.  Plan: [facility] Liaison will continue to follow progress and disposition to asess for post hospital community care coordination/management needs.  Referral request for community care coordination: anticipate [facility] Transitions of Care Team follow up.   [facility] Care Management/Population Health does not replace or interfere with any arrangements made by the Inpatient Transition of Care team.   For questions contact:   Elliot Cousin, RN, [facility] Liaison [place]   Population Health Office Hours MTWF  8:00 am-6:00 pm Off on Thursday [phone] mobile [phone] [Office toll free line] Office Hours are M-F 8:30 - 5 pm 24 hour nurse advise line [phone] Concierge  [email or web address]

## 2022-10-07 NOTE — Transitions of Care (Post Inpatient/ED Visit) (Signed)
   10/07/2022  Name: [name] MRN: [id number] DOB: 11-18-1934  Today's TOC FU Call Status: Today's TOC FU Call Status:: Unsuccessul Call (1st Attempt) Unsuccessful Call (1st Attempt) Date: 10/07/22  Attempted to reach the patient regarding the most recent Inpatient/ED visit.  Follow Up Plan: Additional outreach attempts will be made to reach the patient to complete the Transitions of Care (Post Inpatient/ED visit) call.   Jodelle Gross, RN, BSN, CCM Care Management Coordinator [place] Phone: [phone]/Fax: [phone]

## 2022-10-10 ENCOUNTER — Telehealth: Payer: Self-pay

## 2022-10-10 ENCOUNTER — Telehealth: Payer: Self-pay | Admitting: Internal Medicine

## 2022-10-10 NOTE — Telephone Encounter (Signed)
Spoke to [employer] gave verbal orders

## 2022-10-10 NOTE — Telephone Encounter (Signed)
Verlon Au w. [facility] called in on patient behalf  Verbal orders physical therapy  1 week 1  2 week 2  1 week 1    Call back info [phone]

## 2022-10-10 NOTE — Transitions of Care (Post Inpatient/ED Visit) (Signed)
10/10/2022  Name: [name] MRN: [id number] DOB: 1934/07/13  Today's TOC FU Call Status: Today's TOC FU Call Status:: Successful TOC FU Call Competed TOC FU Call Complete Date: 10/10/22  Transition Care Management Follow-up Telephone Call Date of Discharge: 10/06/22 [facility] Type of Discharge: Inpatient Admission Primary Inpatient Discharge Diagnosis:: Acute Pulmonary [place] How have you been since you were released from the hospital?: Better (Per patients daughter) Any questions or concerns?: No  Items Reviewed: Did you receive and understand the discharge instructions provided?: Yes Medications obtained,verified, and reconciled?: Yes (Medications Reviewed) Any new allergies since your discharge?: No Dietary orders reviewed?: Yes Type of Diet Ordered:: Low sodium, heart healthy Do you have support at home?: Yes People in Home: child(ren), adult Name of Support/Comfort Primary Source: [place]  Medications Reviewed Today: Medications Reviewed Today     Reviewed by Jodelle Gross, RN (Case Manager) on 10/10/22 at 1058  Med List Status: <None>   Medication Order Taking? Sig Documenting Provider Last Dose Status Informant  acetaminophen (TYLENOL) 500 MG tablet [id number]  Take 500 mg by mouth every 8 (eight) hours as needed for mild pain or headache. [provider]  Active Child  apixaban (ELIQUIS) 5 MG TABS tablet [id number] Yes Take 1 tablet (5 mg total) by mouth 2 (two) times daily. Start after completing starter pack Emokpae, Courage, MD Taking Active   Apixaban Starter Pack, 10mg  and 5mg , (ELIQUIS DVT/PE STARTER PACK) [id number] Yes Take as directed on package: start with two-5mg  tablets twice daily for 7 days. On day 8, switch to one-5mg  tablet twice daily. Shon Hale, MD Taking Active   ascorbic acid (VITAMIN C) 250 MG CHEW [id number] Yes Chew 250 mg by mouth daily. [provider] Taking Active Child  ASPERCREME  LIDOCAINE EX [id number] Yes Apply 1 application topically daily as needed (for knee pain).  [provider] Taking Active Child  cholecalciferol (VITAMIN D3) 25 MCG (1000 UT) tablet [id number] Yes Take 1,000 Units by mouth daily.  [provider] Taking Active Child  diltiazem (CARDIZEM CD) 180 MG 24 hr capsule [id number] Yes Take 1 capsule (180 mg total) by mouth daily. Antoine Poche, MD Taking Active Child  diltiazem (CARDIZEM) 30 MG tablet [id number] Yes Take 30 mg by mouth every 8 (eight) hours as needed (Palpitations). [provider] Taking Active Child  ezetimibe (ZETIA) 10 MG tablet [id number] Yes Take 1 tablet (10 mg total) by mouth daily. Antoine Poche, MD Taking Active Child  gabapentin (NEURONTIN) 300 MG capsule [id number] Yes Take 300 mg by mouth 3 (three) times daily. [provider] Taking Active Child  levETIRAcetam (KEPPRA) 500 MG tablet [id number] Yes Take 250 mg by mouth 2 (two) times daily. [provider] Taking Active Child  Lifitegrast Benay Spice) 5 % SOLN [id number] Yes Apply 1 drop to eye daily as needed (dry eyes). [provider] Taking Active Child  LINZESS 72 MCG capsule [id number] Yes TAKE (1) CAPSULE BY MOUTH DAILY AS NEEDED FOR CONSTIPATION.  Patient taking differently: Take 72 mcg by mouth daily before breakfast.   Anabel Halon, MD Taking Active Child  loratadine (CLARITIN) 10 MG tablet [id number] Yes Take 10 mg by mouth daily.  [provider] Taking Active Child  meclizine (ANTIVERT) 25 MG tablet [id number] Yes Take 1 tablet (25 mg total) by mouth 3 (three) times daily as needed for dizziness. Geoffery Lyons, MD Taking Active Child  metoprolol succinate (TOPROL-XL) 100 MG 24 hr tablet [id number] Yes TAKE 1  TABLET BY MOUTH INTHE MORNING AND AT BEDTIME. TAKE WITH OR IMMEDIATELY FOLLOWING A MEAL.  Patient taking differently: Take 100 mg by mouth in the morning and at bedtime.   Antoine Poche, MD Taking  Active Child  pantoprazole (PROTONIX) 40 MG tablet [id number] Yes Take 1 tablet (40 mg total) by mouth daily. Shon Hale, MD Taking Active   predniSONE (DELTASONE) 20 MG tablet [id number] Yes Take 2 tablets (40 mg total) by mouth daily with breakfast for 5 days. Shon Hale, MD Taking Active   rosuvastatin (CRESTOR) 40 MG tablet [id number] Yes TAKE ONE TABLET BY MOUTH ONCE DAILY. Antoine Poche, MD Taking Active Child  topiramate (TOPAMAX) 50 MG tablet [id number] Yes Take 50 mg by mouth 2 (two) times daily. [provider] Taking Active Child  traMADol (ULTRAM) 50 MG tablet [id number] Yes Take 0.5-1 tablets (25-50 mg total) by mouth every 12 (twelve) hours as needed for severe pain. Burgess Amor, PA-C Taking Active Child            Home Care and Equipment/Supplies: Were Home Health Services Ordered?: Yes Name of Home Health Agency:: [facility] Has Agency set up a time to come to your home?: Yes First Home Health Visit Date: 10/07/22 Any new equipment or medical supplies ordered?: No  Functional Questionnaire: Do you need assistance with bathing/showering or dressing?: Yes Do you need assistance with meal preparation?: Yes Do you need assistance with eating?: No Do you have difficulty maintaining continence: No Do you need assistance with getting out of bed/getting out of a chair/moving?: Yes Do you have difficulty managing or taking your medications?: Yes  Follow up appointments reviewed: PCP Follow-up appointment confirmed?: No (daughter prefers to call) MD Provider Line [phone] Given: No Specialist Hospital Follow-up appointment confirmed?: Yes Date of Specialist follow-up appointment?: 10/14/22 Follow-Up Specialty Provider:: [facility] Do you need transportation to your follow-up appointment?: No Do you understand care options if your condition(s) worsen?: Yes-patient verbalized understanding Jodelle Gross, RN, BSN, CCM Care Management  Coordinator [facility] Phone: [phone]/Fax: [phone]

## 2022-10-12 DIAGNOSIS — M1990 Unspecified osteoarthritis, unspecified site: Secondary | ICD-10-CM | POA: Diagnosis not present

## 2022-10-12 DIAGNOSIS — E782 Mixed hyperlipidemia: Secondary | ICD-10-CM | POA: Diagnosis not present

## 2022-10-12 DIAGNOSIS — Z556 Problems related to health literacy: Secondary | ICD-10-CM | POA: Diagnosis not present

## 2022-10-12 DIAGNOSIS — G9341 Metabolic encephalopathy: Secondary | ICD-10-CM | POA: Diagnosis not present

## 2022-10-12 DIAGNOSIS — Z8673 Personal history of transient ischemic attack (TIA), and cerebral infarction without residual deficits: Secondary | ICD-10-CM | POA: Diagnosis not present

## 2022-10-12 DIAGNOSIS — E049 Nontoxic goiter, unspecified: Secondary | ICD-10-CM | POA: Diagnosis not present

## 2022-10-12 DIAGNOSIS — Z85038 Personal history of other malignant neoplasm of large intestine: Secondary | ICD-10-CM | POA: Diagnosis not present

## 2022-10-12 DIAGNOSIS — Z86718 Personal history of other venous thrombosis and embolism: Secondary | ICD-10-CM | POA: Diagnosis not present

## 2022-10-12 DIAGNOSIS — I2699 Other pulmonary embolism without acute cor pulmonale: Secondary | ICD-10-CM | POA: Diagnosis not present

## 2022-10-12 DIAGNOSIS — Z7901 Long term (current) use of anticoagulants: Secondary | ICD-10-CM | POA: Diagnosis not present

## 2022-10-12 DIAGNOSIS — I129 Hypertensive chronic kidney disease with stage 1 through stage 4 chronic kidney disease, or unspecified chronic kidney disease: Secondary | ICD-10-CM | POA: Diagnosis not present

## 2022-10-12 DIAGNOSIS — Z7952 Long term (current) use of systemic steroids: Secondary | ICD-10-CM | POA: Diagnosis not present

## 2022-10-12 DIAGNOSIS — N189 Chronic kidney disease, unspecified: Secondary | ICD-10-CM | POA: Diagnosis not present

## 2022-10-12 DIAGNOSIS — U071 COVID-19: Secondary | ICD-10-CM | POA: Diagnosis not present

## 2022-10-12 DIAGNOSIS — E039 Hypothyroidism, unspecified: Secondary | ICD-10-CM | POA: Diagnosis not present

## 2022-10-13 DIAGNOSIS — E039 Hypothyroidism, unspecified: Secondary | ICD-10-CM | POA: Diagnosis not present

## 2022-10-13 DIAGNOSIS — M1990 Unspecified osteoarthritis, unspecified site: Secondary | ICD-10-CM | POA: Diagnosis not present

## 2022-10-13 DIAGNOSIS — Z556 Problems related to health literacy: Secondary | ICD-10-CM | POA: Diagnosis not present

## 2022-10-13 DIAGNOSIS — Z86718 Personal history of other venous thrombosis and embolism: Secondary | ICD-10-CM | POA: Diagnosis not present

## 2022-10-13 DIAGNOSIS — I2699 Other pulmonary embolism without acute cor pulmonale: Secondary | ICD-10-CM | POA: Diagnosis not present

## 2022-10-13 DIAGNOSIS — Z8673 Personal history of transient ischemic attack (TIA), and cerebral infarction without residual deficits: Secondary | ICD-10-CM | POA: Diagnosis not present

## 2022-10-13 DIAGNOSIS — Z7901 Long term (current) use of anticoagulants: Secondary | ICD-10-CM | POA: Diagnosis not present

## 2022-10-13 DIAGNOSIS — E782 Mixed hyperlipidemia: Secondary | ICD-10-CM | POA: Diagnosis not present

## 2022-10-13 DIAGNOSIS — N189 Chronic kidney disease, unspecified: Secondary | ICD-10-CM | POA: Diagnosis not present

## 2022-10-13 DIAGNOSIS — G9341 Metabolic encephalopathy: Secondary | ICD-10-CM | POA: Diagnosis not present

## 2022-10-13 DIAGNOSIS — Z85038 Personal history of other malignant neoplasm of large intestine: Secondary | ICD-10-CM | POA: Diagnosis not present

## 2022-10-13 DIAGNOSIS — I129 Hypertensive chronic kidney disease with stage 1 through stage 4 chronic kidney disease, or unspecified chronic kidney disease: Secondary | ICD-10-CM | POA: Diagnosis not present

## 2022-10-13 DIAGNOSIS — E049 Nontoxic goiter, unspecified: Secondary | ICD-10-CM | POA: Diagnosis not present

## 2022-10-13 DIAGNOSIS — Z7952 Long term (current) use of systemic steroids: Secondary | ICD-10-CM | POA: Diagnosis not present

## 2022-10-13 DIAGNOSIS — U071 COVID-19: Secondary | ICD-10-CM | POA: Diagnosis not present

## 2022-10-14 ENCOUNTER — Ambulatory Visit (HOSPITAL_COMMUNITY)
Admission: RE | Admit: 2022-10-14 | Discharge: 2022-10-14 | Disposition: A | Discharge status: Home / Self Care | Payer: Medicare Other | Source: Ambulatory Visit | Attending: Vascular Surgery | Admitting: Vascular Surgery

## 2022-10-14 ENCOUNTER — Encounter (HOSPITAL_COMMUNITY): Payer: Self-pay

## 2022-10-14 VITALS — BP 137/70 | HR 62

## 2022-10-14 DIAGNOSIS — I2609 Other pulmonary embolism with acute cor pulmonale: Secondary | ICD-10-CM | POA: Insufficient documentation

## 2022-10-14 NOTE — Patient Instructions (Signed)
-Continue apixaban (Eliquis) 5 mg twice daily. -Your refills have been sent to [facility].  -You have been referred back to Dr. Ellin Saba.  -It is important to take your medication around the same time every day.  -Avoid NSAIDs like ibuprofen (Advil, Motrin) and naproxen (Aleve) as well as aspirin doses over 100 mg daily. -Tylenol (acetaminophen) is the preferred over the counter pain medication to lower the risk of bleeding. -Be sure to alert all of your health care providers that you are taking an anticoagulant prior to starting a new medication or having a procedure. -Monitor for signs and symptoms of bleeding (abnormal bruising, prolonged bleeding, nose bleeds, bleeding from gums, discolored urine, black tarry stools). If you have fallen and hit your head OR if your bleeding is severe or not stopping, seek emergency care.  -Go to the emergency room if emergent signs and symptoms of new clot occur (new or worse swelling and pain in an arm or leg, shortness of breath, chest pain, fast or irregular heartbeats, lightheadedness, dizziness, fainting, coughing up blood) or if you experience a significant color change (pale or blue) in the extremity that has the DVT.   If you have any questions or need to reschedule an appointment, please call [phone] [facility].  If you are having an emergency, call 911 or present to the nearest emergency room.   What is a DVT?  -Deep vein thrombosis (DVT) is a condition in which a blood clot forms in a vein of the deep venous system which can occur in the lower leg, thigh, pelvis, arm, or neck. This condition is serious and can be life-threatening if the clot travels to the arteries of the lungs and causing a blockage (pulmonary embolism, PE). A DVT can also damage veins in the leg, which can lead to long-term venous disease, leg pain, swelling, discoloration, and ulcers or sores (post-thrombotic syndrome).  -Treatment may include taking an anticoagulant  medication to prevent more clots from forming and the current clot from growing, wearing compression stockings, and/or surgical procedures to remove or dissolve the clot.   Heart-Healthy Eating Plan Eating a healthy diet is important for the health of your heart. A heart-healthy eating plan includes: Eating less unhealthy fats. Eating more healthy fats. Eating less salt in your food. Salt is also called sodium. Making other changes in your diet. Talk with your doctor or a diet specialist (dietitian) to create an eating plan that is right for you. Cooking Avoid frying your food. Try to bake, boil, grill, or broil it instead. You can also reduce fat by: Removing the skin from poultry. Removing all visible fats from meats. Steaming vegetables in water or broth. Meal planning  At meals, divide your plate into four equal parts: Fill one-half of your plate with vegetables and green salads. Fill one-fourth of your plate with whole grains. Fill one-fourth of your plate with lean protein foods. Eat 2-4 cups of vegetables per day. One cup of vegetables is: 1 cup (91 g) broccoli or cauliflower florets. 2 medium carrots. 1 large bell pepper. 1 large sweet potato. 1 large tomato. 1 medium white potato. 2 cups (150 g) raw leafy greens. Eat 1-2 cups of fruit per day. One cup of fruit is: 1 small apple 1 large banana 1 cup (237 g) mixed fruit, 1 large orange,  cup (82 g) dried fruit, 1 cup (240 mL) 100% fruit juice. Eat more foods that have soluble fiber. These are apples, broccoli, carrots, beans, peas, and barley.  Try to get 20-30 g of fiber per day. Eat 4-5 servings of nuts, legumes, and seeds per week: 1 serving of dried beans or legumes equals  cup (90 g) cooked. 1 serving of nuts is  oz (12 almonds, 24 pistachios, or 7 walnut halves). 1 serving of seeds equals  oz (8 g). General information Eat more home-cooked food. Eat less restaurant, buffet, and fast food. Limit or avoid  alcohol. Limit foods that are high in starch and sugar. Avoid fried foods. Lose weight if you are overweight. Keep track of how much salt (sodium) you eat. This is important if you have high blood pressure. Ask your doctor to tell you more about this. Try to add vegetarian meals each week. Fats Choose healthy fats. These include olive oil and canola oil, flaxseeds, walnuts, almonds, and seeds. Eat more omega-3 fats. These include salmon, mackerel, sardines, tuna, flaxseed oil, and ground flaxseeds. Try to eat fish at least 2 times each week. Check food labels. Avoid foods with trans fats or high amounts of saturated fat. Limit saturated fats. These are often found in animal products, such as meats, butter, and cream. These are also found in plant foods, such as palm oil, palm kernel oil, and coconut oil. Avoid foods with partially hydrogenated oils in them. These have trans fats. Examples are stick margarine, some tub margarines, cookies, crackers, and other baked goods. What foods should I eat? Fruits All fresh, canned (in natural juice), or frozen fruits. Vegetables Fresh or frozen vegetables (raw, steamed, roasted, or grilled). Green salads. Grains Most grains. Choose whole wheat and whole grains most of the time. Rice and pasta, including brown rice and pastas made with whole wheat. Meats and other proteins Lean, well-trimmed beef, veal, pork, and lamb. Chicken and [place] without skin. All fish and shellfish. Wild duck, rabbit, pheasant, and venison. Egg whites or low-cholesterol egg substitutes. Dried beans, peas, lentils, and tofu. Seeds and most nuts. Dairy Low-fat or nonfat cheeses, including ricotta and mozzarella. Skim or 1% milk that is liquid, powdered, or evaporated. Buttermilk that is made with low-fat milk. Nonfat or low-fat yogurt. Fats and oils Non-hydrogenated (trans-free) margarines. Vegetable oils, including soybean, sesame, sunflower, olive, peanut, safflower, corn,  canola, and cottonseed. Salad dressings or mayonnaise made with a vegetable oil. Beverages Mineral water. Coffee and tea. Diet carbonated beverages. Sweets and desserts Sherbet, gelatin, and fruit ice. Small amounts of dark chocolate. Limit all sweets and desserts. Seasonings and condiments All seasonings and condiments. The items listed above may not be a complete list of foods and drinks you can eat. Contact a dietitian for more options. What foods should I avoid? Fruits Canned fruit in heavy syrup. Fruit in cream or butter sauce. Fried fruit. Limit coconut. Vegetables Vegetables cooked in cheese, cream, or butter sauce. Fried vegetables. Grains Breads that are made with saturated or trans fats, oils, or whole milk. Croissants. Sweet rolls. Donuts. High-fat crackers, such as cheese crackers. Meats and other proteins Fatty meats, such as hot dogs, ribs, sausage, bacon, rib-eye roast or steak. High-fat deli meats, such as salami and bologna. Caviar. Domestic duck and goose. Organ meats, such as liver. Dairy Cream, sour cream, cream cheese, and creamed cottage cheese. Whole-milk cheeses. Whole or 2% milk that is liquid, evaporated, or condensed. Whole buttermilk. Cream sauce or high-fat cheese sauce. Yogurt that is made from whole milk. Fats and oils Meat fat, or shortening. Cocoa butter, hydrogenated oils, palm oil, coconut oil, palm kernel oil. Solid fats and shortenings, including  bacon fat, salt pork, lard, and butter. Nondairy cream substitutes. Salad dressings with cheese or sour cream. Beverages Regular sodas and juice drinks with added sugar. Sweets and desserts Frosting. Pudding. Cookies. Cakes. Pies. Milk chocolate or white chocolate. Buttered syrups. Full-fat ice cream or ice cream drinks. The items listed above may not be a complete list of foods and drinks to avoid. Contact a dietitian for more information. Summary Heart-healthy meal planning includes eating less unhealthy  fats, eating more healthy fats, and making other changes in your diet. Eat a balanced diet. This includes fruits and vegetables, low-fat or nonfat dairy, lean protein, nuts and legumes, whole grains, and heart-healthy oils and fats. This information is not intended to replace advice given to you by your health care provider. Make sure you discuss any questions you have with your health care provider. Document Revised: 04/05/2021 Document Reviewed: 04/05/2021 Elsevier Patient Education  2024 [employer].

## 2022-10-14 NOTE — Progress Notes (Signed)
DVT Clinic Note  Name: [name]     MRN: [id number]     DOB: November 04, 1934     Sex: female  PCP: Anabel Halon, MD  Today's Visit: Visit Information: Initial Visit  Referred to DVT Clinic by: Burgess Amor, PA-C [facility] ED)  Referred to CPP by: Dr. Lenell Antu Reason for referral:  Chief Complaint  Patient presents with   Anticoagulation management    s/p ED visit and hospital admission   HISTORY OF PRESENT ILLNESS: [name] is a 88 y.o. female with PMH PE, stroke, silent microhemorrhage of brain, HTN, HLD, CKD, history of colon cancer, hypothyroidism, who presents for follow up anticoagulation medication management. Patient had bilateral PE 04/2018 and was treated with Eliquis until 11/2018 hospitalization when she was diagnosed with a 4 mm frontal hemorrhage. She was on aspirin 325 mg after that and followed by hematologist Dr. Ellin Saba in [place]. She had multiple lower extremity dopplers which were negative for DVT at that time.   The patient presented to the [facility] ED 09/30/22 with left leg pain. Doppler showed no DVT in the left leg but a subacute to chronic nonocclusive DVT was seen in the right central profunda femoral vein. The ED provider discussed with vascular surgery, and Dr. Chestine Spore recommended no anticoagulation given the patient being asymptomatic in the right leg and her history of brain bleed. She then presented again to the ED on 10/02/22 with altered mental status. In the ED she was febrile, tachycardic, and requiring 2 L nasal cannula. CTA chest showed acute PE with evidence of right heart strain consistent with submassive PE. Follow up echo ruled out right heart strain. She was also found to be COVID-positive. Her leg was no longer causing any pain. She was admitted for heparin and transitioned to Eliquis at discharge.   Today patient presents today in a wheelchair accompanied by her daughter [name]. She reports that she is recovering well from her  hospitalization. No SOB, chest pain, tachycardia, palpitations. No pain in either of her legs today. She recently started physical therapy and has been feeling fatigued. Denies abnormal bleeding or bruising. Denies missed doses of Eliquis. She has completed her week of 10 mg BID and as of this morning is taking 5 mg BID.   Positive Thrombotic Risk Factors: Previous VTE, Recent COVID diagnosis (within 3 months), Older Age, Other (comment) (decreased mobility) Bleeding Risk Factors: Age >65 years, Previous bleeding, Anticoagulant therapy  Negative Thrombotic Risk Factors: Recent surgery (within 3 months), Recent trauma (within 3 months), Recent admission to hospital with acute illness (within 3 months), Paralysis, paresis, or recent plaster cast immobilization of lower extremity, Central venous catheterization, Bed rest >72 hours within 3 months, Sedentary journey lasting >8 hours within 4 weeks, Pregnancy, Within 6 weeks postpartum, Recent cesarean section (within 3 months), Estrogen therapy, Testosterone therapy, Erythropoiesis-stimulating agent, Active cancer, Non-malignant, chronic inflammatory condition, Known thrombophilic condition, Smoking, Obesity  Rx Insurance Coverage: Medicare Rx Affordability: Eliquis is $47/month Preferred Pharmacy: Refills have previously been sent to [place]  Past Medical History:  Diagnosis Date   Allergy    Arthritis    Colon cancer (HCC)    colon ca dx 07/30/09   History of cardiac monitoring 07/2017   "Event monitor demonstrated sinus rhythm with isolated PACs and no arrhythmias"   History of colon cancer 06/2009   found at time of TCS 06/29/09, 1.2cm sessile cecal polyp, no adjuvent therapy needed   HTN (hypertension)  Hx of cardiovascular stress test 07/2017   "No diagnostic ST segment changes to indicate ischemia. Small, moderate intensity, reversible apical to basal inferolateral defect consistent with ischemia. This is a low risk study.  Nuclear stress EF: 84%."   Hyperlipidemia    Hypothyroidism    PE (pulmonary thromboembolism) (HCC)    Renal disorder    cyst on kidney    Silent micro-hemorrhage of brain (HCC) 11/25/2018   Stroke [facility])    TIA (transient ischemic attack) 11/25/2018   Vertigo     Past Surgical History:  Procedure Laterality Date   ABDOMINAL HYSTERECTOMY     COLON SURGERY  07/2009   right hemicolectomy, no residual colon cancer on path   COLONOSCOPY  07/16/2010   WGN:[id number] POLYP-TCS 3 YEARS   COLONOSCOPY N/A 08/02/2013   hyperplastic polyps, surveillance in 2020 if benefits outweight the risks   COLONOSCOPY  06/2009   1.2 cm sessile cecal polyp which had adenocarcinoma arising in a tubular adenoma.   PARTIAL THYMECTOMY     partial thyroidectomy     benign tumors    Social History   Socioeconomic History   Marital status: Widowed    Spouse name: Not on file   Number of children: 2   Years of education: Not on file   Highest education level: Not on file  Occupational History   Occupation: Agricultural consultant at [employer]    Employer: RETIRED   Occupation: retired from Designer, fashion/clothing  Tobacco Use   Smoking status: Never   Smokeless tobacco: Never  Vaping Use   Vaping status: Never Used  Substance and Sexual Activity   Alcohol use: No    Alcohol/week: 0.0 standard drinks of alcohol   Drug use: No   Sexual activity: Not Currently  Other Topics Concern   Not on file  Social History Narrative   Not on file   Social Determinants of Health   Financial Resource Strain: Low Risk  (11/13/2020)   Overall Financial Resource Strain (CARDIA)    Difficulty of Paying Living Expenses: Not very hard  Food Insecurity: No Food Insecurity (10/03/2022)   Hunger Vital Sign    Worried About Running Out of Food in the Last Year: Never true    Ran Out of Food in the Last Year: Never true  Transportation Needs: No Transportation Needs (10/03/2022)   PRAPARE - Administrator, Civil Service (Medical): No    Lack  of Transportation (Non-Medical): No  Physical Activity: Insufficiently Active (11/22/2021)   Exercise Vital Sign    Days of Exercise per Week: 4 days    Minutes of Exercise per Session: 10 min  Stress: No Stress Concern Present (11/13/2020)   [employer] of Occupational Health - Occupational Stress Questionnaire    Feeling of Stress : Only a little  Social Connections: Moderately Isolated (11/13/2020)   Social Connection and Isolation Panel [NHANES]    Frequency of Communication with Friends and Family: More than three times a week    Frequency of Social Gatherings with Friends and Family: More than three times a week    Attends Religious Services: More than 4 times per year    Active Member of [employer] or Organizations: No    Attends Banker Meetings: Never    Marital Status: Widowed  Intimate Partner Violence: Not At Risk (10/03/2022)   Humiliation, Afraid, Rape, and Kick questionnaire    Fear of Current or Ex-Partner: No    Emotionally Abused: No    Physically Abused:  No    Sexually Abused: No    Family History  Problem Relation Age of Onset   Colon cancer Mother        >[id number]   Arthritis Mother    Cancer Mother    Heart disease Mother    Hyperlipidemia Mother    Hypertension Mother    Heart attack Father    Heart disease Father    Diabetes Maternal Aunt    Hyperlipidemia Daughter    Hypertension Daughter    Liver disease Neg Hx     Allergies as of 10/14/2022 - Review Complete 10/14/2022  Allergen Reaction Noted   Tape Rash 01/21/2020    Current Outpatient Medications on File Prior to Encounter  Medication Sig Dispense Refill   acetaminophen (TYLENOL) 500 MG tablet Take 500 mg by mouth every 8 (eight) hours as needed for mild pain or headache.     Apixaban Starter Pack, 10mg  and 5mg , (ELIQUIS DVT/PE STARTER PACK) Take as directed on package: start with two-5mg  tablets twice daily for 7 days. On day 8, switch to one-5mg  tablet twice daily. 1 each 0    ascorbic acid (VITAMIN C) 250 MG CHEW Chew 250 mg by mouth daily.     ASPERCREME LIDOCAINE EX Apply 1 application topically daily as needed (for knee pain).      cholecalciferol (VITAMIN D3) 25 MCG (1000 UT) tablet Take 1,000 Units by mouth daily.      diltiazem (CARDIZEM CD) 180 MG 24 hr capsule Take 1 capsule (180 mg total) by mouth daily. 90 capsule 3   diltiazem (CARDIZEM) 30 MG tablet Take 30 mg by mouth every 8 (eight) hours as needed (Palpitations).     ezetimibe (ZETIA) 10 MG tablet Take 1 tablet (10 mg total) by mouth daily. 90 tablet 3   gabapentin (NEURONTIN) 300 MG capsule Take 300 mg by mouth 3 (three) times daily.     levETIRAcetam (KEPPRA) 500 MG tablet Take 250 mg by mouth 2 (two) times daily.     Lifitegrast (XIIDRA) 5 % SOLN Apply 1 drop to eye daily as needed (dry eyes).     LINZESS 72 MCG capsule TAKE (1) CAPSULE BY MOUTH DAILY AS NEEDED FOR CONSTIPATION. (Patient taking differently: Take 72 mcg by mouth daily before breakfast.) 30 capsule 0   loratadine (CLARITIN) 10 MG tablet Take 10 mg by mouth daily.      meclizine (ANTIVERT) 25 MG tablet Take 1 tablet (25 mg total) by mouth 3 (three) times daily as needed for dizziness. 15 tablet 0   metoprolol succinate (TOPROL-XL) 100 MG 24 hr tablet TAKE 1 TABLET BY MOUTH INTHE MORNING AND AT BEDTIME. TAKE WITH OR IMMEDIATELY FOLLOWING A MEAL. (Patient taking differently: Take 100 mg by mouth in the morning and at bedtime.) 180 tablet 1   pantoprazole (PROTONIX) 40 MG tablet Take 1 tablet (40 mg total) by mouth daily. 30 tablet 1   rosuvastatin (CRESTOR) 40 MG tablet TAKE ONE TABLET BY MOUTH ONCE DAILY. 90 tablet 2   topiramate (TOPAMAX) 50 MG tablet Take 50 mg by mouth 2 (two) times daily.     traMADol (ULTRAM) 50 MG tablet Take 0.5-1 tablets (25-50 mg total) by mouth every 12 (twelve) hours as needed for severe pain. 15 tablet 0   [START ON 11/04/2022] apixaban (ELIQUIS) 5 MG TABS tablet Take 1 tablet (5 mg total) by mouth 2 (two) times  daily. Start after completing starter pack 60 tablet 5   No current facility-administered medications on file  prior to encounter.   REVIEW OF SYSTEMS:  Review of Systems  Respiratory:  Negative for shortness of breath.   Cardiovascular:  Negative for chest pain and palpitations.  Musculoskeletal:  Negative for myalgias.  Neurological:  Negative for dizziness.   PHYSICAL EXAMINATION:  Vitals:   10/14/22 1415  BP: 137/70  Pulse: 62  SpO2: 100%   Physical Exam Vitals reviewed.  Cardiovascular:     Rate and Rhythm: Normal rate.  Pulmonary:     Effort: Pulmonary effort is normal.  Musculoskeletal:        General: No tenderness.     Right lower leg: No edema.     Left lower leg: Edema (1+) present.  Skin:    Findings: No bruising or erythema.   Villalta Score for Post-Thrombotic Syndrome: Pain: Absent Cramps: Absent Heaviness: Mild Paresthesia: Mild Pruritus: Absent Pretibial Edema: Mild Skin Induration: Absent Hyperpigmentation: Absent Redness: Absent Venous Ectasia: Absent Pain on calf compression: Absent Villalta Preliminary Score: 3 Is venous ulcer present?: No If venous ulcer is present and score is <15, then 15 points total are assigned: Absent Villalta Total Score: 3  LABS:  CBC     Component Value Date/Time   WBC 14.0 (H) 10/06/2022 0330   RBC 4.49 10/06/2022 0330   HGB 14.2 10/06/2022 0330   HGB 14.0 02/07/2022 1023   HGB 14.7 04/16/2015 0812   HCT 44.2 10/06/2022 0330   HCT 42.1 02/07/2022 1023   HCT 44.2 04/16/2015 0812   PLT 225 10/06/2022 0330   PLT 279 02/07/2022 1023   MCV 98.4 10/06/2022 0330   MCV 96 02/07/2022 1023   MCV 93.8 04/16/2015 0812   MCH 31.6 10/06/2022 0330   MCHC 32.1 10/06/2022 0330   RDW 15.5 10/06/2022 0330   RDW 14.1 02/07/2022 1023   RDW 15.0 (H) 04/16/2015 0812   LYMPHSABS 0.7 10/03/2022 0306   LYMPHSABS 1.6 02/07/2022 1023   LYMPHSABS 1.9 04/16/2015 0812   MONOABS 0.6 10/03/2022 0306   MONOABS 0.4 04/16/2015 0812    EOSABS 0.1 10/03/2022 0306   EOSABS 0.2 02/07/2022 1023   BASOSABS 0.0 10/03/2022 0306   BASOSABS 0.1 02/07/2022 1023   BASOSABS 0.1 04/16/2015 0812    Hepatic Function      Component Value Date/Time   PROT 7.4 10/05/2022 0508   PROT 6.9 12/08/2020 0956   PROT 8.2 04/16/2015 0812   ALBUMIN 3.4 (L) 10/05/2022 0508   ALBUMIN 4.0 12/08/2020 0956   ALBUMIN 4.0 04/16/2015 0812   AST 36 10/05/2022 0508   AST 18 04/16/2015 0812   ALT 47 (H) 10/05/2022 0508   ALT 19 04/16/2015 0812   ALKPHOS 74 10/05/2022 0508   ALKPHOS 103 04/16/2015 0812   BILITOT 0.5 10/05/2022 0508   BILITOT 0.6 12/08/2020 0956   BILITOT 0.78 04/16/2015 0812   BILIDIR <0.1 03/14/2012 0502   IBILI NOT CALCULATED 03/14/2012 0502    Renal Function   Lab Results  Component Value Date   CREATININE 1.38 (H) 10/05/2022   CREATININE 1.41 (H) 10/03/2022   CREATININE 1.64 (H) 10/02/2022    Estimated Creatinine Clearance: 28 mL/min (A) (by C-G formula based on SCr of 1.38 mg/dL (H)).   [facility] Vascular Lab Studies:  09/30/22 doppler 1. No evidence of left lower extremity deep venous thrombosis. 2. Subacute to chronic appearing nonocclusive deep vein thrombosis within central RIGHT profunda femoral vein. Consider dedicated right lower extremity venous duplex for further characterization as clinically indicated.  10/02/22 CTA of the chest: Positive for acute  PE with CT evidence of right heart strain (RV/LV Ratio = 1.25) consistent with at least submassive (intermediate risk) PE. The presence of right heart strain has been associated with an increased risk of morbidity and mortality. Please refer to the "Code PE Focused" order set in EPIC.  New right solid pulmonary nodule within the upper lobe measuring 14 mm. Per Fleischner Society Guidelines, consider a non-contrast Chest CT at 3 months, a PET/CT, or tissue sampling.   These guidelines do not apply to immunocompromised patients and patients with cancer. Follow up  in patients with significant comorbidities as clinically warranted. For lung cancer screening, adhere to Lung-RADS guidelines. Reference: Radiology. 2017; 284(1):228-43.   Stable left thyroid goiter. No follow-up is recommended as this has been previously biopsied.   CT of the abdomen and pelvis: Cholelithiasis without complicating factors.   No acute abnormality noted.  ASSESSMENT: Location of DVT: Right femoral vein Cause of DVT: provoked by a persistent risk factor - risk factors present for VTE include history of VTE, decreased mobility, concomitant COVID infection, and older age.   Patient with recent subacute to chronic, nonocclusive RLE DVT found incidentally on ultrasound when she was evaluated in the ED for left leg pain. Dr. Chestine Spore was consulted in the ED and no anticoagulation was started due to the right leg being asymptomatic and the patient's history of brain bleed. She was referred to the DVT Clinic for follow up. Two days later she was admitted to [facility] with acute submassive PE. She was also found to be COVID positive. A new right solid pulmonary nodule was also seen in CT. She was discharged on Eliquis.   So far, the patient is tolerating anticoagulation with Eliquis well with no bleeding concerns. She is taking it correctly and has no medication access issues. Counseled patient and her daughter on Eliquis. We discussed risk reduction strategies for decreasing bleeding and fall risks. She has refills available at her pharmacy. She was previously established with Dr. Ellin Saba for PE and colon cancer. She was due for follow up last December but has been unable to reschedule. Will send a new referral so she can re-establish for follow up of her new PE.   PLAN: -Continue apixaban (Eliquis) 5 mg twice daily. -Expected duration of therapy: Indefinite as long as benefits outweigh risks. Therapy started on 10/02/22. -Patient educated on purpose, proper use and potential adverse  effects of apixaban (Eliquis). -Discussed importance of taking medication around the same time every day. -Advised patient of medications to avoid (NSAIDs, aspirin doses >100 mg daily). -Educated that Tylenol (acetaminophen) is the preferred analgesic to lower the risk of bleeding. -Advised patient to alert all providers of anticoagulation therapy prior to starting a new medication or having a procedure. -Emphasized importance of monitoring for signs and symptoms of bleeding (abnormal bruising, prolonged bleeding, nose bleeds, bleeding from gums, discolored urine, black tarry stools). -Educated patient to present to the ED if emergent signs and symptoms of new thrombosis occur.  Follow up: with DVT Clinic as needed. Referral placed to hematology to re-establish with Dr. Ellin Saba.   Pervis Hocking, PharmD, [place], CPP Deep [facility] Thrombosis [facility] Clinical Pharmacist Practitioner Office: [phone]

## 2022-10-17 ENCOUNTER — Encounter: Payer: Self-pay | Admitting: Internal Medicine

## 2022-10-17 ENCOUNTER — Ambulatory Visit (INDEPENDENT_AMBULATORY_CARE_PROVIDER_SITE_OTHER): Payer: Medicare Other | Admitting: Internal Medicine

## 2022-10-17 VITALS — BP 123/70 | HR 66 | Resp 16 | Ht 64.0 in | Wt 156.0 lb

## 2022-10-17 DIAGNOSIS — I2609 Other pulmonary embolism with acute cor pulmonale: Secondary | ICD-10-CM

## 2022-10-17 DIAGNOSIS — H04123 Dry eye syndrome of bilateral lacrimal glands: Secondary | ICD-10-CM | POA: Diagnosis not present

## 2022-10-17 DIAGNOSIS — H109 Unspecified conjunctivitis: Secondary | ICD-10-CM | POA: Diagnosis not present

## 2022-10-17 DIAGNOSIS — N179 Acute kidney failure, unspecified: Secondary | ICD-10-CM

## 2022-10-17 DIAGNOSIS — U071 COVID-19: Secondary | ICD-10-CM | POA: Diagnosis not present

## 2022-10-17 DIAGNOSIS — E039 Hypothyroidism, unspecified: Secondary | ICD-10-CM

## 2022-10-17 DIAGNOSIS — K5904 Chronic idiopathic constipation: Secondary | ICD-10-CM | POA: Diagnosis not present

## 2022-10-17 DIAGNOSIS — Z09 Encounter for follow-up examination after completed treatment for conditions other than malignant neoplasm: Secondary | ICD-10-CM | POA: Insufficient documentation

## 2022-10-17 DIAGNOSIS — I10 Essential (primary) hypertension: Secondary | ICD-10-CM

## 2022-10-17 DIAGNOSIS — N189 Chronic kidney disease, unspecified: Secondary | ICD-10-CM

## 2022-10-17 DIAGNOSIS — R052 Subacute cough: Secondary | ICD-10-CM | POA: Insufficient documentation

## 2022-10-17 MED ORDER — OFLOXACIN 0.3 % OP SOLN: 1.0000 [drp] | Freq: Four times a day (QID) | OPHTHALMIC | 0 refills | Status: DC

## 2022-10-17 MED ORDER — UNABLE TO FIND: 0 refills | Status: AC

## 2022-10-17 MED ORDER — LINACLOTIDE 72 MCG PO CAPS
72.0000 ug | ORAL_CAPSULE | Freq: Every day | ORAL | 2 refills | Status: DC
Start: 2022-10-17 — End: 2023-03-20

## 2022-10-17 NOTE — Patient Instructions (Addendum)
Please apply Ofloxacin eye drop for left eye redness.  Please take Linzess as prescribed. Okay to take 2 capsules if needed for persistent constipation.  Please continue to use incentive spirometry or Acapella device for cough. Okay to take Mucinex DM or Robitussin DM as needed for cough.  Please continue taking Eliquis for 6 months.  Please continue to take medications as prescribed.  Please continue to follow low salt diet and ambulate as tolerated.

## 2022-10-17 NOTE — Assessment & Plan Note (Signed)
Ofloxacin eyedrops started Since she has pain upon movement and photophobia, needs to have optometry/ ophthalmology evaluation to rule out gluocoma - sees [facility]

## 2022-10-17 NOTE — Assessment & Plan Note (Signed)
Last CMP reviewed, CKD stage 3b, usually stable GFR in 30s F/u with Nephrology Avoid nephrotoxic agents No proteinuria currently according to Nephrology chart

## 2022-10-17 NOTE — Progress Notes (Signed)
Established Patient Office Visit  Subjective:  Patient ID: [name], female    DOB: April 23, 1934  Age: 88 y.o. MRN: [id number]  CC:  Chief Complaint  Patient presents with   Hospitalization Follow-up    D/c 7/25 pulmonary embolism. Has had a cough that is sometimes productive- clear mucus. It has gotten better but still has a little cough    eye redness    Left eye has been red and irritated and gritty and light sensitivity x 1 week    Constipation    Linzess not helping with constipation     HPI [name] is a 88 y.o. female with past medical history of HTN, prediabetes, CVA with residual left sided numbness and weakness, ?partial seizure, hypothyroidism, colon ca. s/p right hemicolectomy, chronic constipation, PE and CKD stage 3 who presents for follow-up after recent hospitalization.  She was admitted at [facility] on 07/21 with altered mental status.  She was found to have acute PE with right heart strain consistent with submassive PE.  She was started on IV heparin, and later transition to oral DOAC - Eliquis upon discharge.  She was also found to have COVID positive, but was given steroids and symptomatic treatment.  She was discharged with home PT on 07/25.  She has started taking Eliquis for PE.  Denies any chest pain, dyspnea or palpitations currently.  Her daughter reports that she has dry cough mostly, but has noticed clear expectoration at times.  Denies any fever, chills, nausea or vomiting currently.  She also has left eye redness, watering of the eye and gritty sensation.  She has tried Restasis and Systane at different times without much relief.  She prefers to sit in the dark room or wear sunglasses for relief.  Denies any headache currently.  She takes Linzess for constipation, and has been having difficulty passing stool since being discharged.  She has tried Dulcolax with some relief in addition to Linzess.  Denies any melena or  hematochezia.  She was recently placed on pantoprazole for gastritis/GERD upon starting Eliquis.  Past Medical History:  Diagnosis Date   Allergy    Arthritis    Colon cancer (HCC)    colon ca dx 07/30/09   History of cardiac monitoring 07/2017   "Event monitor demonstrated sinus rhythm with isolated PACs and no arrhythmias"   History of colon cancer 06/2009   found at time of TCS 06/29/09, 1.2cm sessile cecal polyp, no adjuvent therapy needed   HTN (hypertension)    Hx of cardiovascular stress test 07/2017   "No diagnostic ST segment changes to indicate ischemia. Small, moderate intensity, reversible apical to basal inferolateral defect consistent with ischemia. This is a low risk study. Nuclear stress EF: 84%."   Hyperlipidemia    Hypothyroidism    PE (pulmonary thromboembolism) (HCC)    Renal disorder    cyst on kidney    Silent micro-hemorrhage of brain (HCC) 11/25/2018   Stroke (HCC)    TIA (transient ischemic attack) 11/25/2018   Vertigo     Past Surgical History:  Procedure Laterality Date   ABDOMINAL HYSTERECTOMY     COLON SURGERY  07/2009   right hemicolectomy, no residual colon cancer on path   COLONOSCOPY  07/16/2010   WCB:[id number] POLYP-TCS 3 YEARS   COLONOSCOPY N/A 08/02/2013   hyperplastic polyps, surveillance in 2020 if benefits outweight the risks   COLONOSCOPY  06/2009   1.2 cm sessile cecal polyp which had adenocarcinoma arising in  a tubular adenoma.   PARTIAL THYMECTOMY     partial thyroidectomy     benign tumors    Family History  Problem Relation Age of Onset   Colon cancer Mother        >age27   Arthritis Mother    Cancer Mother    Heart disease Mother    Hyperlipidemia Mother    Hypertension Mother    Heart attack Father    Heart disease Father    Diabetes Maternal Aunt    Hyperlipidemia Daughter    Hypertension Daughter    Liver disease Neg Hx     Social History   Socioeconomic History   Marital status: Widowed    Spouse name: Not on  file   Number of children: 2   Years of education: Not on file   Highest education level: Not on file  Occupational History   Occupation: Agricultural consultant at [employer]    Employer: RETIRED   Occupation: retired from Designer, fashion/clothing  Tobacco Use   Smoking status: Never   Smokeless tobacco: Never  Vaping Use   Vaping status: Never Used  Substance and Sexual Activity   Alcohol use: No    Alcohol/week: 0.0 standard drinks of alcohol   Drug use: No   Sexual activity: Not Currently  Other Topics Concern   Not on file  Social History Narrative   Not on file   Social Determinants of Health   Financial Resource Strain: Low Risk  (11/13/2020)   Overall Financial Resource Strain (CARDIA)    Difficulty of Paying Living Expenses: Not very hard  Food Insecurity: No Food Insecurity (10/03/2022)   Hunger Vital Sign    Worried About Running Out of Food in the Last Year: Never true    Ran Out of Food in the Last Year: Never true  Transportation Needs: No Transportation Needs (10/03/2022)   PRAPARE - Administrator, Civil Service (Medical): No    Lack of Transportation (Non-Medical): No  Physical Activity: Insufficiently Active (11/22/2021)   Exercise Vital Sign    Days of Exercise per Week: 4 days    Minutes of Exercise per Session: 10 min  Stress: No Stress Concern Present (11/13/2020)   [employer] of Occupational Health - Occupational Stress Questionnaire    Feeling of Stress : Only a little  Social Connections: Moderately Isolated (11/13/2020)   Social Connection and Isolation Panel [NHANES]    Frequency of Communication with Friends and Family: More than three times a week    Frequency of Social Gatherings with Friends and Family: More than three times a week    Attends Religious Services: More than 4 times per year    Active Member of [employer] or Organizations: No    Attends Banker Meetings: Never    Marital Status: Widowed  Intimate Partner Violence: Not At Risk (10/03/2022)    Humiliation, Afraid, Rape, and Kick questionnaire    Fear of Current or Ex-Partner: No    Emotionally Abused: No    Physically Abused: No    Sexually Abused: No    Outpatient Medications Prior to Visit  Medication Sig Dispense Refill   acetaminophen (TYLENOL) 500 MG tablet Take 500 mg by mouth every 8 (eight) hours as needed for mild pain or headache.     [START ON 11/04/2022] apixaban (ELIQUIS) 5 MG TABS tablet Take 1 tablet (5 mg total) by mouth 2 (two) times daily. Start after completing starter pack 60 tablet 5   Apixaban Starter  Pack, 10mg  and 5mg , (ELIQUIS DVT/PE STARTER PACK) Take as directed on package: start with two-5mg  tablets twice daily for 7 days. On day 8, switch to one-5mg  tablet twice daily. 1 each 0   ascorbic acid (VITAMIN C) 250 MG CHEW Chew 250 mg by mouth daily.     ASPERCREME LIDOCAINE EX Apply 1 application topically daily as needed (for knee pain).      cholecalciferol (VITAMIN D3) 25 MCG (1000 UT) tablet Take 1,000 Units by mouth daily.      diltiazem (CARDIZEM CD) 180 MG 24 hr capsule Take 1 capsule (180 mg total) by mouth daily. 90 capsule 3   diltiazem (CARDIZEM) 30 MG tablet Take 30 mg by mouth every 8 (eight) hours as needed (Palpitations).     ezetimibe (ZETIA) 10 MG tablet Take 1 tablet (10 mg total) by mouth daily. 90 tablet 3   gabapentin (NEURONTIN) 300 MG capsule Take 300 mg by mouth 3 (three) times daily.     levETIRAcetam (KEPPRA) 500 MG tablet Take 250 mg by mouth 2 (two) times daily.     Lifitegrast (XIIDRA) 5 % SOLN Apply 1 drop to eye daily as needed (dry eyes).     loratadine (CLARITIN) 10 MG tablet Take 10 mg by mouth daily.      meclizine (ANTIVERT) 25 MG tablet Take 1 tablet (25 mg total) by mouth 3 (three) times daily as needed for dizziness. 15 tablet 0   metoprolol succinate (TOPROL-XL) 100 MG 24 hr tablet TAKE 1 TABLET BY MOUTH INTHE MORNING AND AT BEDTIME. TAKE WITH OR IMMEDIATELY FOLLOWING A MEAL. (Patient taking differently: Take 100 mg by  mouth in the morning and at bedtime.) 180 tablet 1   pantoprazole (PROTONIX) 40 MG tablet Take 1 tablet (40 mg total) by mouth daily. 30 tablet 1   rosuvastatin (CRESTOR) 40 MG tablet TAKE ONE TABLET BY MOUTH ONCE DAILY. 90 tablet 2   topiramate (TOPAMAX) 50 MG tablet Take 50 mg by mouth 2 (two) times daily.     traMADol (ULTRAM) 50 MG tablet Take 0.5-1 tablets (25-50 mg total) by mouth every 12 (twelve) hours as needed for severe pain. 15 tablet 0   LINZESS 72 MCG capsule TAKE (1) CAPSULE BY MOUTH DAILY AS NEEDED FOR CONSTIPATION. (Patient taking differently: Take 72 mcg by mouth daily before breakfast.) 30 capsule 0   No facility-administered medications prior to visit.    Allergies  Allergen Reactions   Tape Rash    Zio monitor adhesive causes ulcerated and infected skin .Had to see derm    ROS Review of Systems  Constitutional:  Negative for chills and fever.  HENT:  Negative for congestion, sinus pressure, sinus pain and sore throat.   Eyes:  Positive for pain, discharge and redness.  Respiratory:  Positive for cough. Negative for shortness of breath.   Cardiovascular:  Positive for leg swelling. Negative for chest pain and palpitations.  Gastrointestinal:  Negative for abdominal pain, constipation, diarrhea, nausea and vomiting.  Endocrine: Negative for polydipsia and polyuria.  Genitourinary:  Negative for dysuria and hematuria.  Musculoskeletal:  Positive for arthralgias (L knee). Negative for neck pain and neck stiffness.  Skin:  Negative for rash.  Neurological:  Negative for dizziness and weakness.  Psychiatric/Behavioral:  Negative for agitation and behavioral problems.       Objective:    Physical Exam Vitals reviewed.  Constitutional:      General: She is not in acute distress.    Appearance: She is not  diaphoretic.  Eyes:     General: No scleral icterus.    Extraocular Movements: Extraocular movements intact.     Conjunctiva/sclera:     Left eye: Left  conjunctiva is injected.  Cardiovascular:     Rate and Rhythm: Normal rate and regular rhythm.     Heart sounds: Normal heart sounds. No murmur heard. Pulmonary:     Breath sounds: Normal breath sounds. No wheezing or rales.  Musculoskeletal:     Cervical back: Neck supple. No tenderness.     Left knee: Swelling present. Decreased range of motion. Tenderness present over the medial joint line.     Right lower leg: No edema.     Left lower leg: No edema.  Skin:    General: Skin is warm.     Findings: No rash.  Neurological:     General: No focal deficit present.     Mental Status: She is alert and oriented to person, place, and time.     Sensory: Sensory deficit (Left UE) present.     Motor: Weakness (Left UE and LE - muscle strength 4/5) present.  Psychiatric:        Mood and Affect: Mood normal.        Behavior: Behavior normal.     BP 123/70   Pulse 66   Resp 16   Ht 5\' 4"  (1.626 m)   Wt 156 lb (70.8 kg)   SpO2 95%   BMI 26.78 kg/m  Wt Readings from Last 3 Encounters:  10/17/22 156 lb (70.8 kg)  10/04/22 147 lb 7.8 oz (66.9 kg)  09/30/22 153 lb (69.4 kg)    Lab Results  Component Value Date   TSH 0.154 (L) 07/21/2022   Lab Results  Component Value Date   WBC 14.0 (H) 10/06/2022   HGB 14.2 10/06/2022   HCT 44.2 10/06/2022   MCV 98.4 10/06/2022   PLT 225 10/06/2022   Lab Results  Component Value Date   NA 137 10/05/2022   K 3.6 10/05/2022   CHLORIDE 105 04/16/2015   CO2 19 (L) 10/05/2022   GLUCOSE 141 (H) 10/05/2022   BUN 29 (H) 10/05/2022   CREATININE 1.38 (H) 10/05/2022   BILITOT 0.5 10/05/2022   ALKPHOS 74 10/05/2022   AST 36 10/05/2022   ALT 47 (H) 10/05/2022   PROT 7.4 10/05/2022   ALBUMIN 3.4 (L) 10/05/2022   CALCIUM 8.8 (L) 10/05/2022   ANIONGAP 10 10/05/2022   EGFR 35 (L) 08/06/2021   Lab Results  Component Value Date   CHOL 157 02/07/2022   Lab Results  Component Value Date   HDL 64 02/07/2022   Lab Results  Component Value Date    LDLCALC 77 02/07/2022   Lab Results  Component Value Date   TRIG 85 02/07/2022   Lab Results  Component Value Date   CHOLHDL 2.5 02/07/2022   Lab Results  Component Value Date   HGBA1C 6.1 (H) 07/21/2022      Assessment & Plan:   Problem List Items Addressed This Visit       Cardiovascular and Mediastinum   Essential hypertension    BP Readings from Last 1 Encounters:  10/17/22 123/70   Well-controlled with Metoprolol and Cardizem Counseled for compliance with the medications Advised DASH diet and moderate exercise/walking as tolerated      Acute pulmonary embolism (HCC) - Primary    S/p IV heparin Now on Eliquis - since 10/06/22 Had right heart strain and elevated BP - BP wnl  today, appears euvolemic      Relevant Orders   CBC   Basic Metabolic Panel (BMET)     Endocrine   Hypothyroidism    Lab Results  Component Value Date   TSH 0.154 (L) 07/21/2022   On Levothyroxine 25 mcg QD No change in appetite, weight, tremors or palpitations Check TSH and free T4      Relevant Orders   TSH + free T4     Genitourinary   Acute kidney injury superimposed on chronic kidney disease (HCC)    Last CMP reviewed, CKD stage 3b, usually stable GFR in 30s F/u with Nephrology Avoid nephrotoxic agents No proteinuria currently according to Nephrology chart      Relevant Orders   CBC   Basic Metabolic Panel (BMET)     Other   Constipation    Usually well-controlled with Linzess Can take Dulcolax or Colace as needed for now Maintain adequate hydration      Relevant Medications   linaclotide (LINZESS) 72 MCG capsule   COVID-19 virus infection    Incidental finding during recent hospitalization Was given steroids Still has subacute cough, advised to use incentive spirometry Acapella device prescription provided Can take Mucinex or Robitussin DM as needed for cough      Relevant Orders   Flutter valve   Hospital discharge follow-up    Hospital chart  reviewed, including discharge summary Medications reconciled and reviewed with the patient in detail      Relevant Orders   CBC   Basic Metabolic Panel (BMET)   Bacterial conjunctivitis of left eye    Ofloxacin eyedrops started Since she has pain upon movement and photophobia, needs to have optometry/ ophthalmology evaluation to rule out gluocoma - sees [facility]      Relevant Medications   ofloxacin (OCUFLOX) 0.3 % ophthalmic solution   Subacute cough   Relevant Orders   Flutter valve    Meds ordered this encounter  Medications   linaclotide (LINZESS) 72 MCG capsule    Sig: Take 1 capsule (72 mcg total) by mouth daily before breakfast.    Dispense:  30 capsule    Refill:  2   ofloxacin (OCUFLOX) 0.3 % ophthalmic solution    Sig: Place 1 drop into the left eye 4 (four) times daily.    Dispense:  5 mL    Refill:  0   UNABLE TO FIND    Sig: Flutter value- acapella device- use as instructed    Dispense:  1 each    Refill:  0    Follow-up: Return if symptoms worsen or fail to improve.    Anabel Halon, MD

## 2022-10-17 NOTE — Assessment & Plan Note (Signed)
S/p IV heparin Now on Eliquis - since 10/06/22 Had right heart strain and elevated BP - BP wnl today, appears euvolemic

## 2022-10-17 NOTE — Assessment & Plan Note (Signed)
Usually well-controlled with Linzess Can take Dulcolax or Colace as needed for now Maintain adequate hydration

## 2022-10-17 NOTE — Assessment & Plan Note (Signed)
Hospital chart reviewed, including discharge summary Medications reconciled and reviewed with the patient in detail 

## 2022-10-17 NOTE — Assessment & Plan Note (Signed)
Incidental finding during recent hospitalization Was given steroids Still has subacute cough, advised to use incentive spirometry Acapella device prescription provided Can take Mucinex or Robitussin DM as needed for cough

## 2022-10-17 NOTE — Assessment & Plan Note (Signed)
BP Readings from Last 1 Encounters:  10/17/22 123/70   Well-controlled with Metoprolol and Cardizem Counseled for compliance with the medications Advised DASH diet and moderate exercise/walking as tolerated

## 2022-10-17 NOTE — Assessment & Plan Note (Signed)
Lab Results  Component Value Date   TSH 0.154 (L) 07/21/2022   On Levothyroxine 25 mcg QD No change in appetite, weight, tremors or palpitations Check TSH and free T4

## 2022-10-18 ENCOUNTER — Inpatient Hospital Stay: Payer: Medicare Other | Admitting: Internal Medicine

## 2022-10-18 DIAGNOSIS — E049 Nontoxic goiter, unspecified: Secondary | ICD-10-CM | POA: Diagnosis not present

## 2022-10-18 DIAGNOSIS — Z86718 Personal history of other venous thrombosis and embolism: Secondary | ICD-10-CM | POA: Diagnosis not present

## 2022-10-18 DIAGNOSIS — Z8673 Personal history of transient ischemic attack (TIA), and cerebral infarction without residual deficits: Secondary | ICD-10-CM | POA: Diagnosis not present

## 2022-10-18 DIAGNOSIS — I129 Hypertensive chronic kidney disease with stage 1 through stage 4 chronic kidney disease, or unspecified chronic kidney disease: Secondary | ICD-10-CM | POA: Diagnosis not present

## 2022-10-18 DIAGNOSIS — Z556 Problems related to health literacy: Secondary | ICD-10-CM | POA: Diagnosis not present

## 2022-10-18 DIAGNOSIS — I2699 Other pulmonary embolism without acute cor pulmonale: Secondary | ICD-10-CM | POA: Diagnosis not present

## 2022-10-18 DIAGNOSIS — N189 Chronic kidney disease, unspecified: Secondary | ICD-10-CM | POA: Diagnosis not present

## 2022-10-18 DIAGNOSIS — U071 COVID-19: Secondary | ICD-10-CM | POA: Diagnosis not present

## 2022-10-18 DIAGNOSIS — G9341 Metabolic encephalopathy: Secondary | ICD-10-CM | POA: Diagnosis not present

## 2022-10-18 DIAGNOSIS — Z7901 Long term (current) use of anticoagulants: Secondary | ICD-10-CM | POA: Diagnosis not present

## 2022-10-18 DIAGNOSIS — E782 Mixed hyperlipidemia: Secondary | ICD-10-CM | POA: Diagnosis not present

## 2022-10-18 DIAGNOSIS — E039 Hypothyroidism, unspecified: Secondary | ICD-10-CM | POA: Diagnosis not present

## 2022-10-18 DIAGNOSIS — M1990 Unspecified osteoarthritis, unspecified site: Secondary | ICD-10-CM | POA: Diagnosis not present

## 2022-10-18 DIAGNOSIS — Z85038 Personal history of other malignant neoplasm of large intestine: Secondary | ICD-10-CM | POA: Diagnosis not present

## 2022-10-18 DIAGNOSIS — Z7952 Long term (current) use of systemic steroids: Secondary | ICD-10-CM | POA: Diagnosis not present

## 2022-10-21 DIAGNOSIS — U071 COVID-19: Secondary | ICD-10-CM | POA: Diagnosis not present

## 2022-10-21 DIAGNOSIS — Z556 Problems related to health literacy: Secondary | ICD-10-CM | POA: Diagnosis not present

## 2022-10-21 DIAGNOSIS — Z7952 Long term (current) use of systemic steroids: Secondary | ICD-10-CM | POA: Diagnosis not present

## 2022-10-21 DIAGNOSIS — Z85038 Personal history of other malignant neoplasm of large intestine: Secondary | ICD-10-CM | POA: Diagnosis not present

## 2022-10-21 DIAGNOSIS — E039 Hypothyroidism, unspecified: Secondary | ICD-10-CM | POA: Diagnosis not present

## 2022-10-21 DIAGNOSIS — E049 Nontoxic goiter, unspecified: Secondary | ICD-10-CM | POA: Diagnosis not present

## 2022-10-21 DIAGNOSIS — I129 Hypertensive chronic kidney disease with stage 1 through stage 4 chronic kidney disease, or unspecified chronic kidney disease: Secondary | ICD-10-CM | POA: Diagnosis not present

## 2022-10-21 DIAGNOSIS — Z7901 Long term (current) use of anticoagulants: Secondary | ICD-10-CM | POA: Diagnosis not present

## 2022-10-21 DIAGNOSIS — N189 Chronic kidney disease, unspecified: Secondary | ICD-10-CM | POA: Diagnosis not present

## 2022-10-21 DIAGNOSIS — G9341 Metabolic encephalopathy: Secondary | ICD-10-CM | POA: Diagnosis not present

## 2022-10-21 DIAGNOSIS — M1990 Unspecified osteoarthritis, unspecified site: Secondary | ICD-10-CM | POA: Diagnosis not present

## 2022-10-21 DIAGNOSIS — Z8673 Personal history of transient ischemic attack (TIA), and cerebral infarction without residual deficits: Secondary | ICD-10-CM | POA: Diagnosis not present

## 2022-10-21 DIAGNOSIS — I2699 Other pulmonary embolism without acute cor pulmonale: Secondary | ICD-10-CM | POA: Diagnosis not present

## 2022-10-21 DIAGNOSIS — Z86718 Personal history of other venous thrombosis and embolism: Secondary | ICD-10-CM | POA: Diagnosis not present

## 2022-10-21 DIAGNOSIS — E782 Mixed hyperlipidemia: Secondary | ICD-10-CM | POA: Diagnosis not present

## 2022-10-26 DIAGNOSIS — Z8673 Personal history of transient ischemic attack (TIA), and cerebral infarction without residual deficits: Secondary | ICD-10-CM | POA: Diagnosis not present

## 2022-10-26 DIAGNOSIS — Z7952 Long term (current) use of systemic steroids: Secondary | ICD-10-CM | POA: Diagnosis not present

## 2022-10-26 DIAGNOSIS — E049 Nontoxic goiter, unspecified: Secondary | ICD-10-CM | POA: Diagnosis not present

## 2022-10-26 DIAGNOSIS — E039 Hypothyroidism, unspecified: Secondary | ICD-10-CM | POA: Diagnosis not present

## 2022-10-26 DIAGNOSIS — Z7901 Long term (current) use of anticoagulants: Secondary | ICD-10-CM | POA: Diagnosis not present

## 2022-10-26 DIAGNOSIS — M1990 Unspecified osteoarthritis, unspecified site: Secondary | ICD-10-CM | POA: Diagnosis not present

## 2022-10-26 DIAGNOSIS — N189 Chronic kidney disease, unspecified: Secondary | ICD-10-CM | POA: Diagnosis not present

## 2022-10-26 DIAGNOSIS — G9341 Metabolic encephalopathy: Secondary | ICD-10-CM | POA: Diagnosis not present

## 2022-10-26 DIAGNOSIS — I2699 Other pulmonary embolism without acute cor pulmonale: Secondary | ICD-10-CM | POA: Diagnosis not present

## 2022-10-26 DIAGNOSIS — Z556 Problems related to health literacy: Secondary | ICD-10-CM | POA: Diagnosis not present

## 2022-10-26 DIAGNOSIS — E782 Mixed hyperlipidemia: Secondary | ICD-10-CM | POA: Diagnosis not present

## 2022-10-26 DIAGNOSIS — U071 COVID-19: Secondary | ICD-10-CM | POA: Diagnosis not present

## 2022-10-26 DIAGNOSIS — Z85038 Personal history of other malignant neoplasm of large intestine: Secondary | ICD-10-CM | POA: Diagnosis not present

## 2022-10-26 DIAGNOSIS — Z86718 Personal history of other venous thrombosis and embolism: Secondary | ICD-10-CM | POA: Diagnosis not present

## 2022-10-26 DIAGNOSIS — I129 Hypertensive chronic kidney disease with stage 1 through stage 4 chronic kidney disease, or unspecified chronic kidney disease: Secondary | ICD-10-CM | POA: Diagnosis not present

## 2022-11-07 ENCOUNTER — Ambulatory Visit: Payer: Medicare Other | Admitting: Internal Medicine

## 2022-11-09 ENCOUNTER — Ambulatory Visit (INDEPENDENT_AMBULATORY_CARE_PROVIDER_SITE_OTHER): Payer: Medicare Other | Admitting: Internal Medicine

## 2022-11-09 ENCOUNTER — Encounter: Payer: Self-pay | Admitting: Internal Medicine

## 2022-11-09 VITALS — BP 150/81 | HR 65 | Ht 64.0 in | Wt 155.2 lb

## 2022-11-09 DIAGNOSIS — H6123 Impacted cerumen, bilateral: Secondary | ICD-10-CM | POA: Diagnosis not present

## 2022-11-09 DIAGNOSIS — R42 Dizziness and giddiness: Secondary | ICD-10-CM | POA: Diagnosis not present

## 2022-11-09 MED ORDER — MECLIZINE HCL 25 MG PO TABS
25.0000 mg | ORAL_TABLET | Freq: Two times a day (BID) | ORAL | 0 refills | Status: DC | PRN
Start: 2022-11-09 — End: 2023-06-22

## 2022-11-09 NOTE — Patient Instructions (Signed)
Please take Meclizine as needed for dizziness.  Please avoid sudden positional changes.  Maintain adequate hydration and eat at regular intervals.  Okay to use Debrox for excess ear wax. Avoid using sharp objects for cleaning purposes.

## 2022-11-09 NOTE — Progress Notes (Signed)
Acute Office Visit  Subjective:    Patient ID: [name], female    DOB: 11/10/1934, 88 y.o.   MRN: [id number]  Chief Complaint  Patient presents with   Dizziness    Patient feels dizzy and off balance, daughter wants her ears checked to make sure she didn't have an infection     HPI Patient is in today for complaint of dizziness for the last 2 weeks.  She has felt off balance due to it.  She had dizziness upon sitting as well.  She feels room spinning sensation with head movement at times.  Her BP is overall well controlled for her age currently.  Denies any ear pain or discharge currently.  She has history of excessive earwax.  She has tried taking OTC meclizine with mild relief.  Past Medical History:  Diagnosis Date   Allergy    Arthritis    Colon cancer (HCC)    colon ca dx 07/30/09   History of cardiac monitoring 07/2017   "Event monitor demonstrated sinus rhythm with isolated PACs and no arrhythmias"   History of colon cancer 06/2009   found at time of TCS 06/29/09, 1.2cm sessile cecal polyp, no adjuvent therapy needed   HTN (hypertension)    Hx of cardiovascular stress test 07/2017   "No diagnostic ST segment changes to indicate ischemia. Small, moderate intensity, reversible apical to basal inferolateral defect consistent with ischemia. This is a low risk study. Nuclear stress EF: 84%."   Hyperlipidemia    Hypothyroidism    PE (pulmonary thromboembolism) (HCC)    Renal disorder    cyst on kidney    Silent micro-hemorrhage of brain (HCC) 11/25/2018   Stroke [facility])    TIA (transient ischemic attack) 11/25/2018   Vertigo     Past Surgical History:  Procedure Laterality Date   ABDOMINAL HYSTERECTOMY     COLON SURGERY  07/2009   right hemicolectomy, no residual colon cancer on path   COLONOSCOPY  07/16/2010   EXB:[id number] POLYP-TCS 3 YEARS   COLONOSCOPY N/A 08/02/2013   hyperplastic polyps, surveillance in 2020 if benefits outweight the risks    COLONOSCOPY  06/2009   1.2 cm sessile cecal polyp which had adenocarcinoma arising in a tubular adenoma.   PARTIAL THYMECTOMY     partial thyroidectomy     benign tumors    Family History  Problem Relation Age of Onset   Colon cancer Mother        >age63   Arthritis Mother    Cancer Mother    Heart disease Mother    Hyperlipidemia Mother    Hypertension Mother    Heart attack Father    Heart disease Father    Diabetes Maternal Aunt    Hyperlipidemia Daughter    Hypertension Daughter    Liver disease Neg Hx     Social History   Socioeconomic History   Marital status: Widowed    Spouse name: Not on file   Number of children: 2   Years of education: Not on file   Highest education level: Not on file  Occupational History   Occupation: Agricultural consultant at [employer]    Employer: RETIRED   Occupation: retired from Designer, fashion/clothing  Tobacco Use   Smoking status: Never   Smokeless tobacco: Never  Vaping Use   Vaping status: Never Used  Substance and Sexual Activity   Alcohol use: No    Alcohol/week: 0.0 standard drinks of alcohol   Drug use: No   Sexual  activity: Not Currently  Other Topics Concern   Not on file  Social History Narrative   Not on file   Social Determinants of Health   Financial Resource Strain: Low Risk  (11/13/2020)   Overall Financial Resource Strain (CARDIA)    Difficulty of Paying Living Expenses: Not very hard  Food Insecurity: No Food Insecurity (10/03/2022)   Hunger Vital Sign    Worried About Running Out of Food in the Last Year: Never true    Ran Out of Food in the Last Year: Never true  Transportation Needs: No Transportation Needs (10/03/2022)   PRAPARE - Administrator, Civil Service (Medical): No    Lack of Transportation (Non-Medical): No  Physical Activity: Insufficiently Active (11/22/2021)   Exercise Vital Sign    Days of Exercise per Week: 4 days    Minutes of Exercise per Session: 10 min  Stress: No Stress Concern Present (11/13/2020)    [employer] of Occupational Health - Occupational Stress Questionnaire    Feeling of Stress : Only a little  Social Connections: Moderately Isolated (11/13/2020)   Social Connection and Isolation Panel [NHANES]    Frequency of Communication with Friends and Family: More than three times a week    Frequency of Social Gatherings with Friends and Family: More than three times a week    Attends Religious Services: More than 4 times per year    Active Member of [employer] or Organizations: No    Attends Banker Meetings: Never    Marital Status: Widowed  Intimate Partner Violence: Not At Risk (10/03/2022)   Humiliation, Afraid, Rape, and Kick questionnaire    Fear of Current or Ex-Partner: No    Emotionally Abused: No    Physically Abused: No    Sexually Abused: No    Outpatient Medications Prior to Visit  Medication Sig Dispense Refill   acetaminophen (TYLENOL) 500 MG tablet Take 500 mg by mouth every 8 (eight) hours as needed for mild pain or headache.     apixaban (ELIQUIS) 5 MG TABS tablet Take 1 tablet (5 mg total) by mouth 2 (two) times daily. Start after completing starter pack 60 tablet 5   Apixaban Starter Pack, 10mg  and 5mg , (ELIQUIS DVT/PE STARTER PACK) Take as directed on package: start with two-5mg  tablets twice daily for 7 days. On day 8, switch to one-5mg  tablet twice daily. 1 each 0   ascorbic acid (VITAMIN C) 250 MG CHEW Chew 250 mg by mouth daily.     ASPERCREME LIDOCAINE EX Apply 1 application topically daily as needed (for knee pain).      cholecalciferol (VITAMIN D3) 25 MCG (1000 UT) tablet Take 1,000 Units by mouth daily.      diltiazem (CARDIZEM CD) 180 MG 24 hr capsule Take 1 capsule (180 mg total) by mouth daily. 90 capsule 3   diltiazem (CARDIZEM) 30 MG tablet Take 30 mg by mouth every 8 (eight) hours as needed (Palpitations).     ezetimibe (ZETIA) 10 MG tablet Take 1 tablet (10 mg total) by mouth daily. 90 tablet 3   gabapentin (NEURONTIN) 300 MG capsule  Take 300 mg by mouth 3 (three) times daily.     levETIRAcetam (KEPPRA) 500 MG tablet Take 250 mg by mouth 2 (two) times daily.     Lifitegrast (XIIDRA) 5 % SOLN Apply 1 drop to eye daily as needed (dry eyes).     linaclotide (LINZESS) 72 MCG capsule Take 1 capsule (72 mcg total) by mouth  daily before breakfast. 30 capsule 2   loratadine (CLARITIN) 10 MG tablet Take 10 mg by mouth daily.      metoprolol succinate (TOPROL-XL) 100 MG 24 hr tablet TAKE 1 TABLET BY MOUTH INTHE MORNING AND AT BEDTIME. TAKE WITH OR IMMEDIATELY FOLLOWING A MEAL. (Patient taking differently: Take 100 mg by mouth in the morning and at bedtime.) 180 tablet 1   ofloxacin (OCUFLOX) 0.3 % ophthalmic solution Place 1 drop into the left eye 4 (four) times daily. 5 mL 0   pantoprazole (PROTONIX) 40 MG tablet Take 1 tablet (40 mg total) by mouth daily. 30 tablet 1   rosuvastatin (CRESTOR) 40 MG tablet TAKE ONE TABLET BY MOUTH ONCE DAILY. 90 tablet 2   topiramate (TOPAMAX) 50 MG tablet Take 50 mg by mouth 2 (two) times daily.     traMADol (ULTRAM) 50 MG tablet Take 0.5-1 tablets (25-50 mg total) by mouth every 12 (twelve) hours as needed for severe pain. 15 tablet 0   UNABLE TO FIND Flutter value- acapella device- use as instructed 1 each 0   meclizine (ANTIVERT) 25 MG tablet Take 1 tablet (25 mg total) by mouth 3 (three) times daily as needed for dizziness. 15 tablet 0   No facility-administered medications prior to visit.    Allergies  Allergen Reactions   Tape Rash    Zio monitor adhesive causes ulcerated and infected skin .Had to see derm    Review of Systems  Constitutional:  Negative for chills and fever.  HENT:  Negative for congestion, sinus pressure, sinus pain and sore throat.   Eyes:  Negative for pain and discharge.  Respiratory:  Positive for cough. Negative for shortness of breath.   Cardiovascular:  Positive for leg swelling. Negative for chest pain and palpitations.  Gastrointestinal:  Negative for  abdominal pain, constipation, diarrhea, nausea and vomiting.  Endocrine: Negative for polydipsia and polyuria.  Genitourinary:  Negative for dysuria and hematuria.  Musculoskeletal:  Positive for arthralgias (L knee). Negative for neck pain and neck stiffness.  Skin:  Negative for rash.  Neurological:  Positive for dizziness. Negative for weakness.  Psychiatric/Behavioral:  Negative for agitation and behavioral problems.        Objective:    Physical Exam Vitals reviewed.  Constitutional:      General: She is not in acute distress.    Appearance: She is not diaphoretic.  HENT:     Right Ear: Ear canal normal. There is impacted cerumen.     Left Ear: Ear canal normal. There is impacted cerumen.  Eyes:     General: No scleral icterus.    Extraocular Movements: Extraocular movements intact.  Cardiovascular:     Rate and Rhythm: Normal rate and regular rhythm.     Heart sounds: Normal heart sounds. No murmur heard. Pulmonary:     Breath sounds: Normal breath sounds. No wheezing or rales.  Musculoskeletal:     Cervical back: Neck supple. No tenderness.     Left knee: Swelling present. Decreased range of motion. Tenderness present over the medial joint line.     Right lower leg: No edema.     Left lower leg: No edema.  Skin:    General: Skin is warm.     Findings: No rash.  Neurological:     General: No focal deficit present.     Mental Status: She is alert and oriented to person, place, and time.     Sensory: Sensory deficit (Left UE) present.  Motor: Weakness (Left UE and LE - muscle strength 4/5) present.  Psychiatric:        Mood and Affect: Mood normal.        Behavior: Behavior normal.     BP (!) 150/81 (BP Location: Right Arm, Patient Position: Sitting, Cuff Size: Normal)   Pulse 65   Ht 5\' 4"  (1.626 m)   Wt 155 lb 3.2 oz (70.4 kg)   SpO2 95%   BMI 26.64 kg/m  Wt Readings from Last 3 Encounters:  11/09/22 155 lb 3.2 oz (70.4 kg)  10/17/22 156 lb (70.8 kg)   10/04/22 147 lb 7.8 oz (66.9 kg)        Assessment & Plan:   Problem List Items Addressed This Visit       Nervous and Auditory   Impacted cerumen of both ears    Ear irrigation bilaterally today Advised to use Debrox as needed Avoid using sharp objects for cleaning purposes        Other   Vertigo - Primary    Her dizziness is likely due to vertigo Prescribed meclizine 25 mg as needed for dizziness Avoid sudden positional changes Maintain adequate hydration She used to get Epley maneuvers at ENT according to chart review -if persistent dizziness, may need ENT evaluation      Relevant Medications   meclizine (ANTIVERT) 25 MG tablet     Meds ordered this encounter  Medications   meclizine (ANTIVERT) 25 MG tablet    Sig: Take 1 tablet (25 mg total) by mouth 2 (two) times daily as needed for dizziness.    Dispense:  30 tablet    Refill:  0     Ketty Bitton Concha Se, MD

## 2022-11-11 ENCOUNTER — Encounter: Payer: Self-pay | Admitting: Internal Medicine

## 2022-11-11 DIAGNOSIS — H6123 Impacted cerumen, bilateral: Secondary | ICD-10-CM | POA: Insufficient documentation

## 2022-11-11 NOTE — Assessment & Plan Note (Signed)
Her dizziness is likely due to vertigo Prescribed meclizine 25 mg as needed for dizziness Avoid sudden positional changes Maintain adequate hydration She used to get Epley maneuvers at ENT according to chart review -if persistent dizziness, may need ENT evaluation

## 2022-11-11 NOTE — Assessment & Plan Note (Signed)
Ear irrigation bilaterally today Advised to use Debrox as needed Avoid using sharp objects for cleaning purposes

## 2022-11-16 ENCOUNTER — Encounter: Payer: Self-pay | Admitting: Neurology

## 2022-11-16 ENCOUNTER — Ambulatory Visit: Payer: Medicare Other | Admitting: Neurology

## 2022-11-16 VITALS — BP 140/73 | HR 61 | Ht 64.0 in | Wt 155.0 lb

## 2022-11-16 DIAGNOSIS — R569 Unspecified convulsions: Secondary | ICD-10-CM

## 2022-11-16 DIAGNOSIS — G8929 Other chronic pain: Secondary | ICD-10-CM

## 2022-11-16 DIAGNOSIS — R42 Dizziness and giddiness: Secondary | ICD-10-CM

## 2022-11-16 DIAGNOSIS — R519 Headache, unspecified: Secondary | ICD-10-CM

## 2022-11-16 MED ORDER — LEVETIRACETAM 250 MG PO TABS: 250.0000 mg | ORAL_TABLET | Freq: Two times a day (BID) | ORAL | 3 refills | Status: DC

## 2022-11-16 NOTE — Progress Notes (Signed)
[facility]  PATIENT: [name] DOB: 04-Feb-1935  REQUESTING CLINICIAN: Anabel Halon, MD HISTORY FROM: Patient, daughter and chart review  REASON FOR VISIT: Headaches, seizure disorder TOC from [facility]    HISTORICAL  CHIEF COMPLAINT:  Chief Complaint  Patient presents with   Follow-up    Rm 12. Patient with daughter [name]. Patient reports moving her head too fast will cause the dizziness to worsen. No seizures to be reported.    INTERVAL HISTORY 11/16/2022:  Patient presents for follow-up, she is accompanied by her daughter.  Last visit was in March and since then, she has been doing well no seizure or seizure like activity.  She is compliant with the Keppra 250 mg twice daily, denies any side effect.  Patient tells me during this time, she has been struggling with dizziness, she has been seen in the ED for the same.  She was recommended to increase her fluid intake and she is working on increasing her fluid intake daily.  She feels her memory is okay, sometimes she will forget what her daughter told her or misplacing items but nothing more than that.   HISTORY OF PRESENT ILLNESS:  This is a 88 year old woman with multiple medical conditions including hypertension, hyperlipidemia, hypothyroidism, TIA, vertigo, seizure disorder chronic headaches, who is presenting to establish care.  Patient was previously managed at [facility] neurology by Dr. Gerilyn Pilgrim.  History today is mainly obtained via chart review and from patient's daughter.  She reports about 5 years ago she had an event of altered mental status, confusion.  Daughter reports calling patient throughout the day and unable to reach her and when she went to her house she was just keeps on repeating "what's wrong"  "what's wrong"  "what's wrong".  She was taken to the hospital, initial workup was negative.  They follow-up with Dr. Gerilyn Pilgrim who treated the event as seizure and started the patient  on Keppra 250 mg twice daily since then he had she has not had any additional event.  She did also have a routine EEG which was normal.  Patient is also complaining of chronic headache.  Her headaches are mainly left-sided. She will have moderate headache at least once every other week that she might take Tylenol and she will have severe headaches at least once every 4 to 6 weeks, these headaches can last up to 3 days and she takes tramadol 12.5 mg She described her headaches are strong dull pain, denies any other associating features as sensitivity to light or noise or nausea and vomiting.  Patient also have chronic vertigo and some other chronic headache symptoms.   OTHER MEDICAL CONDITIONS: Chronic headaches, seizures, hypertension, hyperlipidemia, hypothyroidism, TIA, vertigo   REVIEW OF SYSTEMS: Full 14 system review of systems performed and negative with exception of: As noted in the HPI   ALLERGIES: Allergies  Allergen Reactions   Tape Rash    Zio monitor adhesive causes ulcerated and infected skin .Had to see derm    HOME MEDICATIONS: Outpatient Medications Prior to Visit  Medication Sig Dispense Refill   acetaminophen (TYLENOL) 500 MG tablet Take 500 mg by mouth every 8 (eight) hours as needed for mild pain or headache.     apixaban (ELIQUIS) 5 MG TABS tablet Take 1 tablet (5 mg total) by mouth 2 (two) times daily. Start after completing starter pack 60 tablet 5   ascorbic acid (VITAMIN C) 250 MG CHEW Chew 250 mg by mouth daily.  ASPERCREME LIDOCAINE EX Apply 1 application topically daily as needed (for knee pain).      cholecalciferol (VITAMIN D3) 25 MCG (1000 UT) tablet Take 1,000 Units by mouth daily.      diltiazem (CARDIZEM CD) 180 MG 24 hr capsule Take 1 capsule (180 mg total) by mouth daily. 90 capsule 3   diltiazem (CARDIZEM) 30 MG tablet Take 30 mg by mouth every 8 (eight) hours as needed (Palpitations).     ezetimibe (ZETIA) 10 MG tablet Take 1 tablet (10 mg total) by  mouth daily. 90 tablet 3   gabapentin (NEURONTIN) 300 MG capsule Take 300 mg by mouth 3 (three) times daily.     Lifitegrast (XIIDRA) 5 % SOLN Apply 1 drop to eye daily as needed (dry eyes).     linaclotide (LINZESS) 72 MCG capsule Take 1 capsule (72 mcg total) by mouth daily before breakfast. 30 capsule 2   loratadine (CLARITIN) 10 MG tablet Take 10 mg by mouth daily.      meclizine (ANTIVERT) 25 MG tablet Take 1 tablet (25 mg total) by mouth 2 (two) times daily as needed for dizziness. 30 tablet 0   metoprolol succinate (TOPROL-XL) 100 MG 24 hr tablet TAKE 1 TABLET BY MOUTH INTHE MORNING AND AT BEDTIME. TAKE WITH OR IMMEDIATELY FOLLOWING A MEAL. (Patient taking differently: Take 100 mg by mouth in the morning and at bedtime.) 180 tablet 1   pantoprazole (PROTONIX) 40 MG tablet Take 1 tablet (40 mg total) by mouth daily. 30 tablet 1   rosuvastatin (CRESTOR) 40 MG tablet TAKE ONE TABLET BY MOUTH ONCE DAILY. 90 tablet 2   topiramate (TOPAMAX) 50 MG tablet Take 50 mg by mouth 2 (two) times daily.     traMADol (ULTRAM) 50 MG tablet Take 0.5-1 tablets (25-50 mg total) by mouth every 12 (twelve) hours as needed for severe pain. 15 tablet 0   UNABLE TO FIND Flutter value- acapella device- use as instructed 1 each 0   levETIRAcetam (KEPPRA) 500 MG tablet Take 250 mg by mouth 2 (two) times daily.     Apixaban Starter Pack, 10mg  and 5mg , (ELIQUIS DVT/PE STARTER PACK) Take as directed on package: start with two-5mg  tablets twice daily for 7 days. On day 8, switch to one-5mg  tablet twice daily. (Patient not taking: Reported on 11/16/2022) 1 each 0   ofloxacin (OCUFLOX) 0.3 % ophthalmic solution Place 1 drop into the left eye 4 (four) times daily. (Patient not taking: Reported on 11/16/2022) 5 mL 0   No facility-administered medications prior to visit.    PAST MEDICAL HISTORY: Past Medical History:  Diagnosis Date   Allergy    Arthritis    Colon cancer (HCC)    colon ca dx 07/30/09   History of cardiac  monitoring 07/2017   "Event monitor demonstrated sinus rhythm with isolated PACs and no arrhythmias"   History of colon cancer 06/2009   found at time of TCS 06/29/09, 1.2cm sessile cecal polyp, no adjuvent therapy needed   HTN (hypertension)    Hx of cardiovascular stress test 07/2017   "No diagnostic ST segment changes to indicate ischemia. Small, moderate intensity, reversible apical to basal inferolateral defect consistent with ischemia. This is a low risk study. Nuclear stress EF: 84%."   Hyperlipidemia    Hypothyroidism    PE (pulmonary thromboembolism) (HCC)    Renal disorder    cyst on kidney    Silent micro-hemorrhage of brain (HCC) 11/25/2018   Stroke [facility])    TIA (transient  ischemic attack) 11/25/2018   Vertigo     PAST SURGICAL HISTORY: Past Surgical History:  Procedure Laterality Date   ABDOMINAL HYSTERECTOMY     COLON SURGERY  07/2009   right hemicolectomy, no residual colon cancer on path   COLONOSCOPY  07/16/2010   GEX:[id number] POLYP-TCS 3 YEARS   COLONOSCOPY N/A 08/02/2013   hyperplastic polyps, surveillance in 2020 if benefits outweight the risks   COLONOSCOPY  06/2009   1.2 cm sessile cecal polyp which had adenocarcinoma arising in a tubular adenoma.   PARTIAL THYMECTOMY     partial thyroidectomy     benign tumors    FAMILY HISTORY: Family History  Problem Relation Age of Onset   Colon cancer Mother        >age28   Arthritis Mother    Cancer Mother    Heart disease Mother    Hyperlipidemia Mother    Hypertension Mother    Heart attack Father    Heart disease Father    Diabetes Maternal Aunt    Hyperlipidemia Daughter    Hypertension Daughter    Liver disease Neg Hx     SOCIAL HISTORY: Social History   Socioeconomic History   Marital status: Widowed    Spouse name: Not on file   Number of children: 2   Years of education: Not on file   Highest education level: Not on file  Occupational History   Occupation: Agricultural consultant at [employer]    Employer:  RETIRED   Occupation: retired from Designer, fashion/clothing  Tobacco Use   Smoking status: Never   Smokeless tobacco: Never  Vaping Use   Vaping status: Never Used  Substance and Sexual Activity   Alcohol use: No    Alcohol/week: 0.0 standard drinks of alcohol   Drug use: No   Sexual activity: Not Currently  Other Topics Concern   Not on file  Social History Narrative   Not on file   Social Determinants of Health   Financial Resource Strain: Low Risk  (11/13/2020)   Overall Financial Resource Strain (CARDIA)    Difficulty of Paying Living Expenses: Not very hard  Food Insecurity: No Food Insecurity (10/03/2022)   Hunger Vital Sign    Worried About Running Out of Food in the Last Year: Never true    Ran Out of Food in the Last Year: Never true  Transportation Needs: No Transportation Needs (10/03/2022)   PRAPARE - Administrator, Civil Service (Medical): No    Lack of Transportation (Non-Medical): No  Physical Activity: Insufficiently Active (11/22/2021)   Exercise Vital Sign    Days of Exercise per Week: 4 days    Minutes of Exercise per Session: 10 min  Stress: No Stress Concern Present (11/13/2020)   [employer] of Occupational Health - Occupational Stress Questionnaire    Feeling of Stress : Only a little  Social Connections: Moderately Isolated (11/13/2020)   Social Connection and Isolation Panel [NHANES]    Frequency of Communication with Friends and Family: More than three times a week    Frequency of Social Gatherings with Friends and Family: More than three times a week    Attends Religious Services: More than 4 times per year    Active Member of [employer] or Organizations: No    Attends Banker Meetings: Never    Marital Status: Widowed  Intimate Partner Violence: Not At Risk (10/03/2022)   Humiliation, Afraid, Rape, and Kick questionnaire    Fear of Current or Ex-Partner: No  Emotionally Abused: No    Physically Abused: No    Sexually Abused: No     PHYSICAL EXAM  GENERAL EXAM/CONSTITUTIONAL: Vitals:  Vitals:   11/16/22 1535  BP: (!) 140/73  Pulse: 61  Weight: 155 lb (70.3 kg)  Height: 5\' 4"  (1.626 m)    Body mass index is 26.61 kg/m. Wt Readings from Last 3 Encounters:  11/16/22 155 lb (70.3 kg)  11/09/22 155 lb 3.2 oz (70.4 kg)  10/17/22 156 lb (70.8 kg)   Patient is in no distress; well developed, nourished and groomed; neck is supple  MUSCULOSKELETAL: Gait, strength, tone, movements noted in Neurologic exam below  NEUROLOGIC: MENTAL STATUS:     11/16/2022    3:41 PM  MMSE - Mini Mental State Exam  Orientation to time 4  Orientation to Place 4  Registration 3  Attention/ Calculation 5  Recall 1  Language- name 2 objects 2  Language- repeat 1  Language- follow 3 step command 3  Language- read & follow direction 1  Write a sentence 1  Copy design 1  Total score 26    CRANIAL NERVE:  2nd, 3rd, 4th, 6th - Visual fields full to confrontation, extraocular muscles intact, no nystagmus 5th - facial sensation symmetric 7th - facial strength symmetric 8th - hearing intact 9th - palate elevates symmetrically, uvula midline 11th - shoulder shrug symmetric 12th - tongue protrusion midline  MOTOR:  normal bulk and tone, full strength in the BUE, BLE  SENSORY:  normal and symmetric to light touch  COORDINATION:  finger-nose-finger, fine finger movements normal  GAIT/STATION:  Deferred, using a wheelchair today, but ambulates with a walker    DIAGNOSTIC DATA (LABS, IMAGING, TESTING) - I reviewed patient records, labs, notes, testing and imaging myself where available.  Lab Results  Component Value Date   WBC 6.4 10/17/2022   HGB 12.0 10/17/2022   HCT 38.1 10/17/2022   MCV 99 (H) 10/17/2022   PLT 294 10/17/2022      Component Value Date/Time   NA 146 (H) 10/17/2022 1444   NA 144 04/16/2015 0812   K 4.1 10/17/2022 1444   K 3.9 04/16/2015 0812   CL 109 (H) 10/17/2022 1444   CL 105  04/02/2012 0914   CO2 26 10/17/2022 1444   CO2 29 04/16/2015 0812   GLUCOSE 71 10/17/2022 1444   GLUCOSE 141 (H) 10/05/2022 0508   GLUCOSE 102 04/16/2015 0812   GLUCOSE 105 (H) 04/02/2012 0914   BUN 23 10/17/2022 1444   BUN 12.4 04/16/2015 0812   CREATININE 1.85 (H) 10/17/2022 1444   CREATININE 1.52 (H) 09/20/2019 1605   CREATININE 1.2 (H) 04/16/2015 0812   CALCIUM 8.6 (L) 10/17/2022 1444   CALCIUM 10.0 04/16/2015 0812   PROT 7.4 10/05/2022 0508   PROT 6.9 12/08/2020 0956   PROT 8.2 04/16/2015 0812   ALBUMIN 3.4 (L) 10/05/2022 0508   ALBUMIN 4.0 12/08/2020 0956   ALBUMIN 4.0 04/16/2015 0812   AST 36 10/05/2022 0508   AST 18 04/16/2015 0812   ALT 47 (H) 10/05/2022 0508   ALT 19 04/16/2015 0812   ALKPHOS 74 10/05/2022 0508   ALKPHOS 103 04/16/2015 0812   BILITOT 0.5 10/05/2022 0508   BILITOT 0.6 12/08/2020 0956   BILITOT 0.78 04/16/2015 0812   GFRNONAA 38 (L) 10/05/2022 0508   GFRNONAA 36 (L) 11/04/2016 0854   GFRAA 41 (L) 12/03/2019 1214   GFRAA 42 (L) 11/04/2016 0854   Lab Results  Component Value Date   CHOL  157 02/07/2022   HDL 64 02/07/2022   LDLCALC 77 02/07/2022   TRIG 85 02/07/2022   CHOLHDL 2.5 02/07/2022   Lab Results  Component Value Date   HGBA1C 6.1 (H) 07/21/2022   No results found for: "VITAMINB12" Lab Results  Component Value Date   TSH 0.164 (L) 10/17/2022      ASSESSMENT AND PLAN  88 y.o. year old female with chronic headaches, seizure disorder, hypertension, hyperlipidemia, hypothyroidism, TIA, vertigo here for follow up.  She is doing well on Keppra 250 mg twice daily, again per her daughter, there was only one event where patient was confused asking the same question over and over.  Will continue Keppra 250 mg twice daily for 1 more year and if she does not have any additional seizures we will discontinue the medication. When it comes to her memory, patient today has a normal MoCA.  She possibly has mild cognitive impairment but otherwise  she is doing well.  Continue to follow with PCP, increase your fluid intake and I will see you in 1 year or sooner if worse.   1. Seizures (HCC)   2. Chronic nonintractable headache, unspecified headache type   3. Vertigo     Patient Instructions  Continue current medications  Follow up in a year, at that time if no seizure, will discontinue Keppra  Continue to follow up with PCP  Return sooner if worse or any other concerns.   No orders of the defined types were placed in this encounter.   Meds ordered this encounter  Medications   levETIRAcetam (KEPPRA) 250 MG tablet    Sig: Take 1 tablet (250 mg total) by mouth 2 (two) times daily.    Dispense:  180 tablet    Refill:  3    Return in about 1 year (around 11/16/2023).   Windell Norfolk, MD 11/16/2022, 5:52 PM  [facility] [address], [address] [place], [place] [address] [phone]

## 2022-11-16 NOTE — Patient Instructions (Signed)
Continue current medications  Follow up in a year, at that time if no seizure, will discontinue Keppra  Continue to follow up with PCP  Return sooner if worse or any other concerns.

## 2022-11-22 ENCOUNTER — Other Ambulatory Visit: Payer: Medicare Other

## 2022-11-26 ENCOUNTER — Encounter: Payer: Self-pay | Admitting: Neurology

## 2022-11-29 ENCOUNTER — Ambulatory Visit: Payer: Medicare Other | Admitting: Neurology

## 2022-11-29 ENCOUNTER — Telehealth: Payer: Self-pay | Admitting: Neurology

## 2022-11-29 ENCOUNTER — Ambulatory Visit: Payer: Medicare Other | Admitting: Hematology

## 2022-11-29 MED ORDER — TOPIRAMATE 50 MG PO TABS: 50.0000 mg | ORAL_TABLET | Freq: Two times a day (BID) | ORAL | 1 refills | Status: DC

## 2022-11-29 NOTE — Addendum Note (Signed)
Addended by: Eather Colas E on: 11/29/2022 03:04 PM   Modules accepted: Orders

## 2022-11-29 NOTE — Telephone Encounter (Signed)
Pt is requesting a refill for topiramate (TOPAMAX) 50 MG tablet .  Pharmacy: [facility] If possible pt's daughter would like to know if this can be done for a 90 day Rx

## 2022-11-29 NOTE — Telephone Encounter (Signed)
Requested Prescriptions   Pending Prescriptions Disp Refills   topiramate (TOPAMAX) 50 MG tablet 90 tablet 1    Sig: Take 1 tablet (50 mg total) by mouth 2 (two) times daily.   Refill sent

## 2022-11-30 ENCOUNTER — Other Ambulatory Visit: Payer: Self-pay

## 2022-11-30 ENCOUNTER — Ambulatory Visit (INDEPENDENT_AMBULATORY_CARE_PROVIDER_SITE_OTHER): Payer: Medicare Other

## 2022-11-30 ENCOUNTER — Telehealth: Payer: Self-pay

## 2022-11-30 VITALS — Ht 64.0 in | Wt 155.0 lb

## 2022-11-30 DIAGNOSIS — Z0000 Encounter for general adult medical examination without abnormal findings: Secondary | ICD-10-CM

## 2022-11-30 DIAGNOSIS — I10 Essential (primary) hypertension: Secondary | ICD-10-CM

## 2022-11-30 MED ORDER — UNABLE TO FIND: 1.0000 | Freq: Every day | 0 refills | Status: AC

## 2022-11-30 NOTE — Patient Instructions (Signed)
[name] , Thank you for taking time to come for your Medicare Wellness Visit. I appreciate your ongoing commitment to your health goals. Please review the following plan we discussed and let me know if I can assist you in the future.   Referrals/Orders/Follow-Ups/Clinician Recommendations:  You are due for the vaccines checked below. You may have these done at your preferred pharmacy. Please have them fax the office proof of the vaccines so that we can update your chart.   [x]  Flu (due annually)  Recommended this fall either at PCP office or through your local pharmacy. The flu season starts August 1 of each year.   [x]  Shingrix (Shingles vaccine): CDC recommends 2 doses of Shingrix separated by 2-6 months for aged 17 years and older:  []  Pneumonia Vaccines: Recommended for adults 65 years or older  [x]  TDAP (Tetanus) Vaccine every 10 years:Recommended every 10 years; Please call your insurance company to determine your out of pocket expense. You also receive this vaccine at your local pharmacy or Health Dept.  [x]  Covid-19: Available now at any [facility] pharmacy (see info below)  You may also get your vaccines at any [facility] (locations listed below.) Vaccine hours are Monday - Friday 9:00 - 4:00. No appointments are required. Most insurances are accepted including Medicaid. Anyone can use the community pharmacies, and people are not required to have a [facility] provider.  Community Pharmacy Locations offering vaccines:   Sport and exercise psychologist   [facility] [place] [place]  10 vaccines are offered at the [employer]: Covid, flu, Tdap, shingles, RSV, pneumonia, meningococcal, hepatitis A, hepatitis B, and HPV.   Next Medicare Annual Wellness Visit: February 21, 2024 at 8:40am This will be a virtual visit so we will do it via video. If you are unable to do the visit through  video please let our office know.   This is a list of the screening recommended for you and due dates:  Health Maintenance  Topic Date Due   DTaP/Tdap/Td vaccine (1 - Tdap) Never done   Zoster (Shingles) Vaccine (1 of 2) 11/23/1953   Flu Shot  10/13/2022   COVID-19 Vaccine (5 - 2023-24 season) 11/13/2022   Medicare Annual Wellness Visit  11/30/2023   Pneumonia Vaccine  Completed   DEXA scan (bone density measurement)  Completed   HPV Vaccine  Aged Out    Advanced directives: (In Chart) A copy of your advanced directives are scanned into your chart should your provider ever need it.  Next Medicare Annual Wellness Visit scheduled for next year: Yes  Preventive Care 82 Years and Older, Female Preventive care refers to lifestyle choices and visits with your health care provider that can promote health and wellness. Preventive care visits are also called wellness exams. What can I expect for my preventive care visit? Counseling Your health care provider may ask you questions about your: Medical history, including: Past medical problems. Family medical history. Pregnancy and menstrual history. History of falls. Current health, including: Memory and ability to understand (cognition). Emotional well-being. Home life and relationship well-being. Sexual activity and sexual health. Lifestyle, including: Alcohol, nicotine or tobacco, and drug use. Access to firearms. Diet, exercise, and sleep habits. Work and work Astronomer. Sunscreen use. Safety issues such as seatbelt and bike helmet use. Physical exam Your health care provider will check your: Height and weight. These may be used to calculate your  BMI (body mass index). BMI is a measurement that tells if you are at a healthy weight. Waist circumference. This measures the distance around your waistline. This measurement also tells if you are at a healthy weight and may help predict your risk of certain diseases, such as type 2  diabetes and high blood pressure. Heart rate and blood pressure. Body temperature. Skin for abnormal spots. What immunizations do I need?  Vaccines are usually given at various ages, according to a schedule. Your health care provider will recommend vaccines for you based on your age, medical history, and lifestyle or other factors, such as travel or where you work. What tests do I need? Screening Your health care provider may recommend screening tests for certain conditions. This may include: Lipid and cholesterol levels. Hepatitis C test. Hepatitis B test. HIV (human immunodeficiency virus) test. STI (sexually transmitted infection) testing, if you are at risk. Lung cancer screening. Colorectal cancer screening. Diabetes screening. This is done by checking your blood sugar (glucose) after you have not eaten for a while (fasting). Mammogram. Talk with your health care provider about how often you should have regular mammograms. BRCA-related cancer screening. This may be done if you have a family history of breast, ovarian, tubal, or peritoneal cancers. Bone density scan. This is done to screen for osteoporosis. Talk with your health care provider about your test results, treatment options, and if necessary, the need for more tests. Follow these instructions at home: Eating and drinking  Eat a diet that includes fresh fruits and vegetables, whole grains, lean protein, and low-fat dairy products. Limit your intake of foods with high amounts of sugar, saturated fats, and salt. Take vitamin and mineral supplements as recommended by your health care provider. Do not drink alcohol if your health care provider tells you not to drink. If you drink alcohol: Limit how much you have to 0-1 drink a day. Know how much alcohol is in your drink. In the [place] one drink equals one 12 oz bottle of beer (355 mL), one 5 oz glass of wine (148 mL), or one 1 oz glass of hard liquor (44 mL). Lifestyle Brush  your teeth every morning and night with fluoride toothpaste. Floss one time each day. Exercise for at least 30 minutes 5 or more days each week. Do not use any products that contain nicotine or tobacco. These products include cigarettes, chewing tobacco, and vaping devices, such as e-cigarettes. If you need help quitting, ask your health care provider. Do not use drugs. If you are sexually active, practice safe sex. Use a condom or other form of protection in order to prevent STIs. Take aspirin only as told by your health care provider. Make sure that you understand how much to take and what form to take. Work with your health care provider to find out whether it is safe and beneficial for you to take aspirin daily. Ask your health care provider if you need to take a cholesterol-lowering medicine (statin). Find healthy ways to manage stress, such as: Meditation, yoga, or listening to music. Journaling. Talking to a trusted person. Spending time with friends and family. Minimize exposure to UV radiation to reduce your risk of skin cancer. Safety Always wear your seat belt while driving or riding in a vehicle. Do not drive: If you have been drinking alcohol. Do not ride with someone who has been drinking. When you are tired or distracted. While texting. If you have been using any mind-altering substances or drugs. Wear  a helmet and other protective equipment during sports activities. If you have firearms in your house, make sure you follow all gun safety procedures. What's next? Visit your health care provider once a year for an annual wellness visit. Ask your health care provider how often you should have your eyes and teeth checked. Stay up to date on all vaccines. This information is not intended to replace advice given to you by your health care provider. Make sure you discuss any questions you have with your health care provider. Document Revised: 08/26/2020 Document Reviewed:  08/26/2020 Elsevier Patient Education  2024 [employer]. Understanding Your Risk for Falls Millions of people have serious injuries from falls each year. It is important to understand your risk of falling. Talk with your health care provider about your risk and what you can do to lower it. If you do have a serious fall, make sure to tell your provider. Falling once raises your risk of falling again. How can falls affect me? Serious injuries from falls are common. These include: Broken bones, such as hip fractures. Head injuries, such as traumatic brain injuries (TBI) or concussions. A fear of falling can cause you to avoid activities and stay at home. This can make your muscles weaker and raise your risk for a fall. What can increase my risk? There are a number of risk factors that increase your risk for falling. The more risk factors you have, the higher your risk of falling. Serious injuries from a fall happen most often to people who are older than 86 years old. Teenagers and young adults ages 81-29 are also at higher risk. Common risk factors include: Weakness in the lower body. Being generally weak or confused due to long-term (chronic) illness. Dizziness or balance problems. Poor vision. Medicines that cause dizziness or drowsiness. These may include: Medicines for your blood pressure, heart, anxiety, insomnia, or swelling (edema). Pain medicines. Muscle relaxants. Other risk factors include: Drinking alcohol. Having had a fall in the past. Having foot pain or wearing improper footwear. Working at a dangerous job. Having any of the following in your home: Tripping hazards, such as floor clutter or loose rugs. Poor lighting. Pets. Having dementia or memory loss. What actions can I take to lower my risk of falling?     Physical activity Stay physically fit. Do strength and balance exercises. Consider taking a regular class to build strength and balance. Yoga and tai chi are  good options. Vision Have your eyes checked every year and your prescription for glasses or contacts updated as needed. Shoes and walking aids Wear non-skid shoes. Wear shoes that have rubber soles and low heels. Do not wear high heels. Do not walk around the house in socks or slippers. Use a cane or walker as told by your provider. Home safety Attach secure railings on both sides of your stairs. Install grab bars for your bathtub, shower, and toilet. Use a non-skid mat in your bathtub or shower. Attach bath mats securely with double-sided, non-slip rug tape. Use good lighting in all rooms. Keep a flashlight near your bed. Make sure there is a clear path from your bed to the bathroom. Use night-lights. Do not use throw rugs. Make sure all carpeting is taped or tacked down securely. Remove all clutter from walkways and stairways, including extension cords. Repair uneven or broken steps and floors. Avoid walking on icy or slippery surfaces. Walk on the grass instead of on icy or slick sidewalks. Use ice melter to get  rid of ice on walkways in the winter. Use a cordless phone. Questions to ask your health care provider Can you help me check my risk for a fall? Do any of my medicines make me more likely to fall? Should I take a vitamin D supplement? What exercises can I do to improve my strength and balance? Should I make an appointment to have my vision checked? Do I need a bone density test to check for weak bones (osteoporosis)? Would it help to use a cane or a walker? Where to find more information Centers for Disease Control and Prevention, [employer]: TonerPromos.no Community-Based Fall Prevention Programs: TonerPromos.no [employer] on Aging: BaseRingTones.pl Contact a health care provider if: You fall at home. You are afraid of falling at home. You feel weak, drowsy, or dizzy. This information is not intended to replace advice given to you by your health care provider. Make sure you discuss  any questions you have with your health care provider. Document Revised: 11/01/2021 Document Reviewed: 11/01/2021 Elsevier Patient Education  2024 [employer].

## 2022-11-30 NOTE — Telephone Encounter (Signed)
Faxed to [facility]

## 2022-11-30 NOTE — Progress Notes (Signed)
Because this visit was a virtual/telehealth visit,  certain criteria was not obtained, such a blood pressure, CBG if applicable, and timed get up and go. Any medications not marked as "taking" were not mentioned during the medication reconciliation part of the visit. Any vitals not documented were not able to be obtained due to this being a telehealth visit or patient was unable to self-report a recent blood pressure reading due to a lack of equipment at home via telehealth. Vitals that have been documented are verbally provided by the patient.   Subjective:   [name] is a 88 y.o. female who presents for Medicare Annual (Subsequent) preventive examination.  Visit Complete: Virtual  I connected with  [name] on 11/30/22 by a audio enabled telemedicine application and verified that I am speaking with the correct person using two identifiers.  Patient Location: Home  Provider Location: Home Office  I discussed the limitations of evaluation and management by telemedicine. The patient expressed understanding and agreed to proceed.  Patient Medicare AWV questionnaire was completed by the patient on na; I have confirmed that all information answered by patient is correct and no changes since this date.  Cardiac Risk Factors include: advanced age (>44men, >107 women);dyslipidemia;hypertension;sedentary lifestyle     Objective:    Today's Vitals   11/30/22 1037 11/30/22 1039  Weight: 155 lb (70.3 kg)   Height: 5\' 4"  (1.626 m)   PainSc:  0-No pain   Body mass index is 26.61 kg/m.     11/30/2022   10:37 AM 10/03/2022    2:10 AM 09/30/2022    1:31 PM 02/12/2022    9:08 AM 11/22/2021    2:58 PM 08/02/2021    7:36 PM 02/15/2021    3:54 PM  Advanced Directives  Does Patient Have a Medical Advance Directive? Yes Yes Yes No Yes No;Yes Yes  Type of Estate agent of [place];Living will Healthcare Power of [employer] of [place];Living  will   Healthcare Power of [place];Living will Healthcare Power of [place];Living will  Does patient want to make changes to medical advance directive? No - Patient declined No - Patient declined   No - Patient declined  No - Patient declined  Copy of Healthcare Power of Attorney in Chart? Yes - validated most recent copy scanned in chart (See row information)      No - copy requested  Would patient like information on creating a medical advance directive?    No - Patient declined       Current Medications (verified) Outpatient Encounter Medications as of 11/30/2022  Medication Sig   acetaminophen (TYLENOL) 500 MG tablet Take 500 mg by mouth every 8 (eight) hours as needed for mild pain or headache.   apixaban (ELIQUIS) 5 MG TABS tablet Take 1 tablet (5 mg total) by mouth 2 (two) times daily. Start after completing starter pack   Apixaban Starter Pack, 10mg  and 5mg , (ELIQUIS DVT/PE STARTER PACK) Take as directed on package: start with two-5mg  tablets twice daily for 7 days. On day 8, switch to one-5mg  tablet twice daily. (Patient not taking: Reported on 11/16/2022)   ascorbic acid (VITAMIN C) 250 MG CHEW Chew 250 mg by mouth daily.   ASPERCREME LIDOCAINE EX Apply 1 application topically daily as needed (for knee pain).    cholecalciferol (VITAMIN D3) 25 MCG (1000 UT) tablet Take 1,000 Units by mouth daily.    diltiazem (CARDIZEM CD) 180 MG 24 hr capsule Take 1 capsule (180 mg  total) by mouth daily.   diltiazem (CARDIZEM) 30 MG tablet Take 30 mg by mouth every 8 (eight) hours as needed (Palpitations).   ezetimibe (ZETIA) 10 MG tablet Take 1 tablet (10 mg total) by mouth daily.   gabapentin (NEURONTIN) 300 MG capsule Take 300 mg by mouth 3 (three) times daily.   levETIRAcetam (KEPPRA) 250 MG tablet Take 1 tablet (250 mg total) by mouth 2 (two) times daily.   Lifitegrast (XIIDRA) 5 % SOLN Apply 1 drop to eye daily as needed (dry eyes).   linaclotide (LINZESS) 72 MCG capsule Take 1 capsule (72 mcg  total) by mouth daily before breakfast.   loratadine (CLARITIN) 10 MG tablet Take 10 mg by mouth daily.    meclizine (ANTIVERT) 25 MG tablet Take 1 tablet (25 mg total) by mouth 2 (two) times daily as needed for dizziness.   metoprolol succinate (TOPROL-XL) 100 MG 24 hr tablet TAKE 1 TABLET BY MOUTH INTHE MORNING AND AT BEDTIME. TAKE WITH OR IMMEDIATELY FOLLOWING A MEAL. (Patient taking differently: Take 100 mg by mouth in the morning and at bedtime.)   ofloxacin (OCUFLOX) 0.3 % ophthalmic solution Place 1 drop into the left eye 4 (four) times daily. (Patient not taking: Reported on 11/16/2022)   pantoprazole (PROTONIX) 40 MG tablet Take 1 tablet (40 mg total) by mouth daily.   rosuvastatin (CRESTOR) 40 MG tablet TAKE ONE TABLET BY MOUTH ONCE DAILY.   topiramate (TOPAMAX) 50 MG tablet Take 1 tablet (50 mg total) by mouth 2 (two) times daily.   traMADol (ULTRAM) 50 MG tablet Take 0.5-1 tablets (25-50 mg total) by mouth every 12 (twelve) hours as needed for severe pain.   UNABLE TO FIND Flutter value- acapella device- use as instructed   No facility-administered encounter medications on file as of 11/30/2022.    Allergies (verified) Tape   History: Past Medical History:  Diagnosis Date   Allergy    Arthritis    Colon cancer (HCC)    colon ca dx 07/30/09   History of cardiac monitoring 07/2017   "Event monitor demonstrated sinus rhythm with isolated PACs and no arrhythmias"   History of colon cancer 06/2009   found at time of TCS 06/29/09, 1.2cm sessile cecal polyp, no adjuvent therapy needed   HTN (hypertension)    Hx of cardiovascular stress test 07/2017   "No diagnostic ST segment changes to indicate ischemia. Small, moderate intensity, reversible apical to basal inferolateral defect consistent with ischemia. This is a low risk study. Nuclear stress EF: 84%."   Hyperlipidemia    Hypothyroidism    PE (pulmonary thromboembolism) (HCC)    Renal disorder    cyst on kidney    Silent  micro-hemorrhage of brain (HCC) 11/25/2018   Stroke [facility])    TIA (transient ischemic attack) 11/25/2018   Vertigo    Past Surgical History:  Procedure Laterality Date   ABDOMINAL HYSTERECTOMY     COLON SURGERY  07/2009   right hemicolectomy, no residual colon cancer on path   COLONOSCOPY  07/16/2010   [id number] POLYP-TCS 3 YEARS   COLONOSCOPY N/A 08/02/2013   hyperplastic polyps, surveillance in 2020 if benefits outweight the risks   COLONOSCOPY  06/2009   1.2 cm sessile cecal polyp which had adenocarcinoma arising in a tubular adenoma.   PARTIAL THYMECTOMY     partial thyroidectomy     benign tumors   Family History  Problem Relation Age of Onset   Colon cancer Mother        >  age64   Arthritis Mother    Cancer Mother    Heart disease Mother    Hyperlipidemia Mother    Hypertension Mother    Heart attack Father    Heart disease Father    Diabetes Maternal Aunt    Hyperlipidemia Daughter    Hypertension Daughter    Liver disease Neg Hx    Social History   Socioeconomic History   Marital status: Widowed    Spouse name: Not on file   Number of children: 2   Years of education: Not on file   Highest education level: Not on file  Occupational History   Occupation: Agricultural consultant at [employer]    Employer: RETIRED   Occupation: retired from Designer, fashion/clothing  Tobacco Use   Smoking status: Never   Smokeless tobacco: Never  Vaping Use   Vaping status: Never Used  Substance and Sexual Activity   Alcohol use: No    Alcohol/week: 0.0 standard drinks of alcohol   Drug use: No   Sexual activity: Not Currently  Other Topics Concern   Not on file  Social History Narrative   Not on file   Social Determinants of Health   Financial Resource Strain: Low Risk  (11/30/2022)   Overall Financial Resource Strain (CARDIA)    Difficulty of Paying Living Expenses: Not hard at all  Food Insecurity: No Food Insecurity (11/30/2022)   Hunger Vital Sign    Worried About Running Out of Food in the  Last Year: Never true    Ran Out of Food in the Last Year: Never true  Transportation Needs: No Transportation Needs (11/30/2022)   PRAPARE - Administrator, Civil Service (Medical): No    Lack of Transportation (Non-Medical): No  Physical Activity: Insufficiently Active (11/30/2022)   Exercise Vital Sign    Days of Exercise per Week: 4 days    Minutes of Exercise per Session: 10 min  Stress: No Stress Concern Present (11/30/2022)   [employer] of Occupational Health - Occupational Stress Questionnaire    Feeling of Stress : Only a little  Social Connections: Moderately Isolated (11/30/2022)   Social Connection and Isolation Panel [NHANES]    Frequency of Communication with Friends and Family: More than three times a week    Frequency of Social Gatherings with Friends and Family: More than three times a week    Attends Religious Services: More than 4 times per year    Active Member of [employer] or Organizations: No    Attends Banker Meetings: Never    Marital Status: Widowed    Tobacco Counseling Counseling given: Yes   Clinical Intake:  Pre-visit preparation completed: Yes  Pain : No/denies pain Pain Score: 0-No pain     BMI - recorded: 26.61 Nutritional Status: BMI 25 -29 Overweight Nutritional Risks: None Diabetes: No  How often do you need to have someone help you when you read instructions, pamphlets, or other written materials from your doctor or pharmacy?: 1 - Never  Interpreter Needed?: No  Information entered by :: Abby Maxen Rowland, CMA   Activities of Daily Living    11/30/2022   10:55 AM 10/03/2022    2:10 AM  In your present state of health, do you have any difficulty performing the following activities:  Hearing? 0 0  Vision? 0 0  Difficulty concentrating or making decisions? 0 1  Walking or climbing stairs? 1 1  Comment uses cane/walker   Dressing or bathing? 0 1  Doing errands, shopping?  1 1  Comment daughter takes her where  she needs to go   Preparing Food and eating ? N   Using the Toilet? N   In the past six months, have you accidently leaked urine? N   Do you have problems with loss of bowel control? N   Managing your Medications? Y   Comment daughter fills pill box up weekly and pt takes meds by herself   Managing your Finances? N   Housekeeping or managing your Housekeeping? N     Patient Care Team: Anabel Halon, MD as PCP - General (Internal Medicine) Laqueta Linden, MD (Inactive) as PCP - Cardiology (Cardiology) West Bali, MD (Inactive) (Gastroenterology) Wyline Mood, Dorothe Pea, MD as Consulting Physician (Cardiology) Windell Norfolk, MD as Consulting Physician (Neurology) Randa Lynn, MD as Consulting Physician (Nephrology)  Indicate any recent Medical Services you may have received from other than [facility] providers in the past year (date may be approximate).     Assessment:   This is a routine wellness examination for [name].  Hearing/Vision screen Hearing Screening - Comments:: Patient denies any hearing difficulties.   Vision Screening - Comments:: Wears rx glasses - up to date with routine eye exams     Goals Addressed             This Visit's Progress    Patient Stated       To stay healthy and active       Depression Screen    11/30/2022   10:48 AM 11/09/2022   10:43 AM 10/17/2022    1:54 PM 09/13/2022    2:02 PM 07/21/2022    2:59 PM 03/01/2022    2:44 PM 01/20/2022    3:16 PM  PHQ 2/9 Scores  PHQ - 2 Score 0 0 0 0 0 0 0  PHQ- 9 Score 3 2 2         Fall Risk    11/30/2022   10:55 AM 11/09/2022   10:43 AM 10/17/2022    1:54 PM 09/13/2022    2:02 PM 07/21/2022    2:59 PM  Fall Risk   Falls in the past year? 0 0 0 0 0  Number falls in past yr: 0 0 0 0 0  Injury with Fall? 0 0 0 0 0  Risk for fall due to : Impaired mobility      Follow up Falls prevention discussed;Education provided        MEDICARE RISK AT HOME: Medicare Risk at Home Any stairs in or  around the home?: No If so, are there any without handrails?: No Home free of loose throw rugs in walkways, pet beds, electrical cords, etc?: Yes Adequate lighting in your home to reduce risk of falls?: Yes Life alert?: No Use of a cane, walker or w/c?: Yes Grab bars in the bathroom?: Yes Shower chair or bench in shower?: Yes Elevated toilet seat or a handicapped toilet?: No  TIMED UP AND GO:  Was the test performed?  No    Cognitive Function:    11/16/2022    3:41 PM  MMSE - Mini Mental State Exam  Orientation to time 4  Orientation to Place 4  Registration 3  Attention/ Calculation 5  Recall 1  Language- name 2 objects 2  Language- repeat 1  Language- follow 3 step command 3  Language- read & follow direction 1  Write a sentence 1  Copy design 1  Total score 26  11/30/2022   10:42 AM 11/22/2021    3:14 PM 11/13/2020    2:39 PM  6CIT Screen  What Year? 0 points 0 points 0 points  What month? 0 points 0 points 0 points  What time? 0 points 0 points 0 points  Count back from 20 0 points 0 points 0 points  Months in reverse 4 points 4 points 2 points  Repeat phrase 8 points 4 points 4 points  Total Score 12 points 8 points 6 points    Immunizations Immunization History  Administered Date(s) Administered   Fluad Quad(high Dose 65+) 11/16/2018, 12/18/2019, 12/15/2020, 01/20/2022   Influenza, High Dose Seasonal PF 12/14/2017   Influenza,inj,Quad PF,6+ Mos 12/15/2015   Moderna SARS-COV2 Booster Vaccination 09/15/2020   Moderna Sars-Covid-2 Vaccination 04/20/2019, 05/21/2019, 02/07/2022   PFIZER(Purple Top)SARS-COV-2 Vaccination 02/10/2020   PNEUMOCOCCAL CONJUGATE-20 01/13/2021   Zoster, Live 06/18/2014    TDAP status: Due, Education has been provided regarding the importance of this vaccine. Advised may receive this vaccine at local pharmacy or Health Dept. Aware to provide a copy of the vaccination record if obtained from local pharmacy or Health Dept.  Verbalized acceptance and understanding.  Flu Vaccine status: Due, Education has been provided regarding the importance of this vaccine. Advised may receive this vaccine at local pharmacy or Health Dept. Aware to provide a copy of the vaccination record if obtained from local pharmacy or Health Dept. Verbalized acceptance and understanding.  Pneumococcal vaccine status: Up to date  Covid-19 vaccine status: Information provided on how to obtain vaccines.   Qualifies for Shingles Vaccine? Yes   Zostavax completed yes Shingrix Completed?: No.    Education has been provided regarding the importance of this vaccine. Patient has been advised to call insurance company to determine out of pocket expense if they have not yet received this vaccine. Advised may also receive vaccine at local pharmacy or Health Dept. Verbalized acceptance and understanding.  Screening Tests Health Maintenance  Topic Date Due   DTaP/Tdap/Td (1 - Tdap) Never done   Zoster Vaccines- Shingrix (1 of 2) 11/23/1953   INFLUENZA VACCINE  10/13/2022   COVID-19 Vaccine (5 - 2023-24 season) 11/13/2022   Medicare Annual Wellness (AWV)  11/23/2022   Pneumonia Vaccine 65+ Years old  Completed   DEXA SCAN  Completed   HPV VACCINES  Aged Out    Health Maintenance  Health Maintenance Due  Topic Date Due   DTaP/Tdap/Td (1 - Tdap) Never done   Zoster Vaccines- Shingrix (1 of 2) 11/23/1953   INFLUENZA VACCINE  10/13/2022   COVID-19 Vaccine (5 - 2023-24 season) 11/13/2022   Medicare Annual Wellness (AWV)  11/23/2022    Colorectal cancer screening: No longer required.   Mammogram status: No longer required due to age.  Bone Density Screening: Not age appropriate for this patient.    Lung Cancer Screening: (Low Dose CT Chest recommended if Age 58-80 years, 20 pack-year currently smoking OR have quit w/in 15years.) does not qualify.    Additional Screening:  Hepatitis C Screening: does not qualify;   Vision Screening:  Recommended annual ophthalmology exams for early detection of glaucoma and other disorders of the eye. Is the patient up to date with their annual eye exam?  Yes  Who is the provider or what is the name of the office in which the patient attends annual eye exams? Patient can't recall name of doctor If pt is not established with a provider, would they like to be referred to a provider to  establish care? No .   Dental Screening: Recommended annual dental exams for proper oral hygiene  Diabetic Foot Exam: n/a  Community Resource Referral / Chronic Care Management: CRR required this visit?  No   CCM required this visit?  No     Plan:     I have personally reviewed and noted the following in the patient's chart:   Medical and social history Use of alcohol, tobacco or illicit drugs  Current medications and supplements including opioid prescriptions. Patient is not currently taking opioid prescriptions. Functional ability and status Nutritional status Physical activity Advanced directives List of other physicians Hospitalizations, surgeries, and ER visits in previous 12 months Vitals Screenings to include cognitive, depression, and falls Referrals and appointments  In addition, I have reviewed and discussed with patient certain preventive protocols, quality metrics, and best practice recommendations. A written personalized care plan for preventive services as well as general preventive health recommendations were provided to patient.     Jordan Hawks Montasia Chisenhall, CMA   11/30/2022   After Visit Summary: (MyChart) Due to this being a telephonic visit, the after visit summary with patients personalized plan was offered to patient via MyChart   Nurse Notes: Patient needs a prescription for a home blood pressure monitor

## 2022-11-30 NOTE — Telephone Encounter (Signed)
Patient needs a prescription for a home blood pressure monitor. Please associate it with dx code I10 so insurance may cover it.   Thank you  Abby Nima Bamburg, CMA  [facility] AWV Team Direct Dial: [phone]

## 2022-12-06 ENCOUNTER — Other Ambulatory Visit: Payer: Self-pay | Admitting: Internal Medicine

## 2022-12-09 ENCOUNTER — Other Ambulatory Visit: Payer: Self-pay

## 2022-12-09 DIAGNOSIS — C188 Malignant neoplasm of overlapping sites of colon: Secondary | ICD-10-CM

## 2022-12-09 DIAGNOSIS — I2699 Other pulmonary embolism without acute cor pulmonale: Secondary | ICD-10-CM

## 2022-12-10 ENCOUNTER — Encounter: Payer: Self-pay | Admitting: Internal Medicine

## 2022-12-12 ENCOUNTER — Other Ambulatory Visit: Payer: Self-pay

## 2022-12-12 ENCOUNTER — Inpatient Hospital Stay: Payer: Medicare Other | Attending: Hematology

## 2022-12-12 DIAGNOSIS — Z809 Family history of malignant neoplasm, unspecified: Secondary | ICD-10-CM | POA: Insufficient documentation

## 2022-12-12 DIAGNOSIS — Z86718 Personal history of other venous thrombosis and embolism: Secondary | ICD-10-CM | POA: Diagnosis not present

## 2022-12-12 DIAGNOSIS — Z85038 Personal history of other malignant neoplasm of large intestine: Secondary | ICD-10-CM | POA: Insufficient documentation

## 2022-12-12 DIAGNOSIS — Z993 Dependence on wheelchair: Secondary | ICD-10-CM | POA: Insufficient documentation

## 2022-12-12 DIAGNOSIS — I2699 Other pulmonary embolism without acute cor pulmonale: Secondary | ICD-10-CM

## 2022-12-12 DIAGNOSIS — C188 Malignant neoplasm of overlapping sites of colon: Secondary | ICD-10-CM

## 2022-12-12 LAB — COMPREHENSIVE METABOLIC PANEL
ALT: 50 U/L — ABNORMAL HIGH (ref 0–44)
AST: 40 U/L (ref 15–41)
Albumin: 3.6 g/dL (ref 3.5–5.0)
Alkaline Phosphatase: 78 U/L (ref 38–126)
Anion gap: 8 (ref 5–15)
BUN: 20 mg/dL (ref 8–23)
CO2: 24 mmol/L (ref 22–32)
Calcium: 9.1 mg/dL (ref 8.9–10.3)
Chloride: 109 mmol/L (ref 98–111)
Creatinine, Ser: 1.68 mg/dL — ABNORMAL HIGH (ref 0.44–1.00)
GFR, Estimated: 29 mL/min — ABNORMAL LOW (ref >60)
Glucose, Bld: 116 mg/dL — ABNORMAL HIGH (ref 70–99)
Potassium: 3.3 mmol/L — ABNORMAL LOW (ref 3.5–5.1)
Sodium: 141 mmol/L (ref 135–145)
Total Bilirubin: 0.5 mg/dL (ref 0.3–1.2)
Total Protein: 7 g/dL (ref 6.5–8.1)

## 2022-12-12 LAB — CBC WITH DIFFERENTIAL/PLATELET
Abs Immature Granulocytes: 0 10*3/uL (ref 0.00–0.07)
Basophils Absolute: 0 10*3/uL (ref 0.0–0.1)
Basophils Relative: 1 %
Eosinophils Absolute: 0.2 10*3/uL (ref 0.0–0.5)
Eosinophils Relative: 4 %
HCT: 39.8 % (ref 36.0–46.0)
Hemoglobin: 12.7 g/dL (ref 12.0–15.0)
Immature Granulocytes: 0 %
Lymphocytes Relative: 40 %
Lymphs Abs: 1.6 10*3/uL (ref 0.7–4.0)
MCH: 32.1 pg (ref 26.0–34.0)
MCHC: 31.9 g/dL (ref 30.0–36.0)
MCV: 100.5 fL — ABNORMAL HIGH (ref 80.0–100.0)
Monocytes Absolute: 0.4 10*3/uL (ref 0.1–1.0)
Monocytes Relative: 9 %
Neutro Abs: 1.8 10*3/uL (ref 1.7–7.7)
Neutrophils Relative %: 46 %
Platelets: 195 10*3/uL (ref 150–400)
RBC: 3.96 MIL/uL (ref 3.87–5.11)
RDW: 14.6 % (ref 11.5–15.5)
WBC: 3.9 10*3/uL — ABNORMAL LOW (ref 4.0–10.5)
nRBC: 0 % (ref 0.0–0.2)

## 2022-12-12 LAB — D-DIMER, QUANTITATIVE: D-Dimer, Quant: <0.27 ug{FEU}/mL (ref 0.00–0.50)

## 2022-12-12 MED ORDER — PANTOPRAZOLE SODIUM 40 MG PO TBEC: 40.0000 mg | DELAYED_RELEASE_TABLET | Freq: Every day | ORAL | 1 refills | Status: DC

## 2022-12-19 ENCOUNTER — Inpatient Hospital Stay: Payer: Medicare Other | Attending: Hematology | Admitting: Hematology

## 2022-12-19 VITALS — BP 152/76 | HR 64 | Temp 98.7°F | Resp 16 | Wt 154.6 lb

## 2022-12-19 DIAGNOSIS — D72819 Decreased white blood cell count, unspecified: Secondary | ICD-10-CM | POA: Diagnosis not present

## 2022-12-19 DIAGNOSIS — Z7901 Long term (current) use of anticoagulants: Secondary | ICD-10-CM | POA: Insufficient documentation

## 2022-12-19 DIAGNOSIS — R809 Proteinuria, unspecified: Secondary | ICD-10-CM | POA: Diagnosis not present

## 2022-12-19 DIAGNOSIS — I2699 Other pulmonary embolism without acute cor pulmonale: Secondary | ICD-10-CM | POA: Insufficient documentation

## 2022-12-19 DIAGNOSIS — N189 Chronic kidney disease, unspecified: Secondary | ICD-10-CM | POA: Diagnosis not present

## 2022-12-19 DIAGNOSIS — Z79899 Other long term (current) drug therapy: Secondary | ICD-10-CM | POA: Insufficient documentation

## 2022-12-19 DIAGNOSIS — Z85038 Personal history of other malignant neoplasm of large intestine: Secondary | ICD-10-CM | POA: Insufficient documentation

## 2022-12-19 DIAGNOSIS — D631 Anemia in chronic kidney disease: Secondary | ICD-10-CM | POA: Diagnosis not present

## 2022-12-19 DIAGNOSIS — H34831 Tributary (branch) retinal vein occlusion, right eye, with macular edema: Secondary | ICD-10-CM | POA: Diagnosis not present

## 2022-12-19 MED ORDER — APIXABAN 5 MG PO TABS
5.0000 mg | ORAL_TABLET | Freq: Two times a day (BID) | ORAL | 5 refills | Status: DC
Start: 2022-12-19 — End: 2023-06-26

## 2022-12-19 NOTE — Progress Notes (Signed)
[facility] [address], [place] [address]    Clinic Day:  12/19/2022  Referring physician: Anabel Halon, MD  Patient Care Team: Anabel Halon, MD as PCP - General (Internal Medicine) Laqueta Linden, MD (Inactive) as PCP - Cardiology (Cardiology) West Bali, MD (Inactive) (Gastroenterology) Wyline Mood Dorothe Pea, MD as Consulting Physician (Cardiology) Windell Norfolk, MD as Consulting Physician (Neurology) Randa Lynn, MD as Consulting Physician (Nephrology)   ASSESSMENT & PLAN:   Assessment: 1.  Unprovoked pulmonary embolism: -CT PE protocol on 04/26/2018 showed bilateral pulmonary emboli. -She was treated with Eliquis until her hospitalization on 11/25/2018 when she was diagnosed with a 4 mm frontal hemorrhage. -Eliquis was discontinued.  She started taking aspirin 325 mg in December 2020. -She had multiple lower extremity Dopplers which were negative for DVT. - Doppler on 09/30/2022: Subacute chronic nonocclusive DVT in the right profundofemoral vein. - CT angiogram on 10/02/2022: Positive for acute PE with CT evidence of right heart strain consistent with at least submassive PE.   2.  Colon cancer: -Cecal polypectomy on 06/29/2009 with adenocarcinoma arising in a tubular adenoma. -Right colon segmental resection with 0/25 lymph nodes positive and no residual cancer on 07/31/2009. -Last colonoscopy on 08/02/2013 showed hyperplastic polyp in the rectum and sigmoid colon. -CT of the abdomen and pelvis on 05/28/2018 did not show any evidence of malignancy.  She did not have any significant weight loss.    Plan: 1.  Recurrent pulmonary embolism: - I have reviewed recent records.  She had recurrent pulmonary embolism with right heart strain in July 2024. - She was started back on Eliquis which she is tolerating well.  She is off of Plavix. - Reviewed labs from 12/12/2022: D-dimer has normalized.  Mild leukopenia.  Hemoglobin stable on  normal. - Recommend continuing Eliquis 5 mg twice daily indefinitely.  She does not report any recent falls. - RTC 6 months to evaluate risk-benefit ratio of continuing anticoagulation.    No orders of the defined types were placed in this encounter.     I,Katie Daubenspeck,acting as a Neurosurgeon for Doreatha Massed, MD.,have documented all relevant documentation on the behalf of Doreatha Massed, MD,as directed by  Doreatha Massed, MD while in the presence of Doreatha Massed, MD.   I, Doreatha Massed MD, have reviewed the above documentation for accuracy and completeness, and I agree with the above.   Doreatha Massed, MD   10/7/20246:01 PM  CHIEF COMPLAINT:   Diagnosis: history of PE and colon cancer    Cancer Staging  Colon cancer [facility]) Staging form: Colon and Rectum, AJCC 7th Edition - Clinical: Stage I (T1, N0, M0) - Unsigned    Prior Therapy: 1. Right colon segmental resection on 07/31/2009. 2. Eliquis from 04/2018 to 11/25/2018.  Current Therapy:  Aspirin 325 mg QD    HISTORY OF PRESENT ILLNESS:   Oncology History   No history exists.     INTERVAL HISTORY:   [name] is a 88 y.o. female presenting to clinic today for follow up of history of PE and colon cancer . She was last seen by me on 02/15/21.  Today, she states that she is doing well overall. Her appetite level is at 100%. Her energy level is at 7 5%.  PAST MEDICAL HISTORY:   Past Medical History: Past Medical History:  Diagnosis Date   Allergy    Arthritis    Colon cancer (HCC)    colon ca dx 07/30/09   History  of cardiac monitoring 07/2017   "Event monitor demonstrated sinus rhythm with isolated PACs and no arrhythmias"   History of colon cancer 06/2009   found at time of TCS 06/29/09, 1.2cm sessile cecal polyp, no adjuvent therapy needed   HTN (hypertension)    Hx of cardiovascular stress test 07/2017   "No diagnostic ST segment changes to indicate ischemia. Small, moderate  intensity, reversible apical to basal inferolateral defect consistent with ischemia. This is a low risk study. Nuclear stress EF: 84%."   Hyperlipidemia    Hypothyroidism    PE (pulmonary thromboembolism) (HCC)    Renal disorder    cyst on kidney    Silent micro-hemorrhage of brain (HCC) 11/25/2018   Stroke (HCC)    TIA (transient ischemic attack) 11/25/2018   Vertigo     Surgical History: Past Surgical History:  Procedure Laterality Date   ABDOMINAL HYSTERECTOMY     COLON SURGERY  07/2009   right hemicolectomy, no residual colon cancer on path   COLONOSCOPY  07/16/2010   [id number] POLYP-TCS 3 YEARS   COLONOSCOPY N/A 08/02/2013   hyperplastic polyps, surveillance in 2020 if benefits outweight the risks   COLONOSCOPY  06/2009   1.2 cm sessile cecal polyp which had adenocarcinoma arising in a tubular adenoma.   PARTIAL THYMECTOMY     partial thyroidectomy     benign tumors    Social History: Social History   Socioeconomic History   Marital status: Widowed    Spouse name: Not on file   Number of children: 2   Years of education: Not on file   Highest education level: Not on file  Occupational History   Occupation: Agricultural consultant at [employer]    Employer: RETIRED   Occupation: retired from Designer, fashion/clothing  Tobacco Use   Smoking status: Never   Smokeless tobacco: Never  Vaping Use   Vaping status: Never Used  Substance and Sexual Activity   Alcohol use: No    Alcohol/week: 0.0 standard drinks of alcohol   Drug use: No   Sexual activity: Not Currently  Other Topics Concern   Not on file  Social History Narrative   Not on file   Social Determinants of Health   Financial Resource Strain: Low Risk  (11/30/2022)   Overall Financial Resource Strain (CARDIA)    Difficulty of Paying Living Expenses: Not hard at all  Food Insecurity: No Food Insecurity (11/30/2022)   Hunger Vital Sign    Worried About Running Out of Food in the Last Year: Never true    Ran Out of Food in the Last  Year: Never true  Transportation Needs: No Transportation Needs (11/30/2022)   PRAPARE - Administrator, Civil Service (Medical): No    Lack of Transportation (Non-Medical): No  Physical Activity: Insufficiently Active (11/30/2022)   Exercise Vital Sign    Days of Exercise per Week: 4 days    Minutes of Exercise per Session: 10 min  Stress: No Stress Concern Present (11/30/2022)   [employer] of Occupational Health - Occupational Stress Questionnaire    Feeling of Stress : Only a little  Social Connections: Moderately Isolated (11/30/2022)   Social Connection and Isolation Panel [NHANES]    Frequency of Communication with Friends and Family: More than three times a week    Frequency of Social Gatherings with Friends and Family: More than three times a week    Attends Religious Services: More than 4 times per year    Active Member of [employer] or Organizations:  No    Attends Banker Meetings: Never    Marital Status: Widowed  Intimate Partner Violence: Not At Risk (11/30/2022)   Humiliation, Afraid, Rape, and Kick questionnaire    Fear of Current or Ex-Partner: No    Emotionally Abused: No    Physically Abused: No    Sexually Abused: No    Family History: Family History  Problem Relation Age of Onset   Colon cancer Mother        >[id number]   Arthritis Mother    Cancer Mother    Heart disease Mother    Hyperlipidemia Mother    Hypertension Mother    Heart attack Father    Heart disease Father    Diabetes Maternal Aunt    Hyperlipidemia Daughter    Hypertension Daughter    Liver disease Neg Hx     Current Medications:  Current Outpatient Medications:    acetaminophen (TYLENOL) 500 MG tablet, Take 500 mg by mouth every 8 (eight) hours as needed for mild pain or headache., Disp: , Rfl:    ascorbic acid (VITAMIN C) 250 MG CHEW, Chew 250 mg by mouth daily., Disp: , Rfl:    ASPERCREME LIDOCAINE EX, Apply 1 application topically daily as needed (for knee  pain). , Disp: , Rfl:    cholecalciferol (VITAMIN D3) 25 MCG (1000 UT) tablet, Take 1,000 Units by mouth daily. , Disp: , Rfl:    diltiazem (CARDIZEM CD) 180 MG 24 hr capsule, Take 1 capsule (180 mg total) by mouth daily., Disp: 90 capsule, Rfl: 3   diltiazem (CARDIZEM) 30 MG tablet, Take 30 mg by mouth every 8 (eight) hours as needed (Palpitations)., Disp: , Rfl:    ezetimibe (ZETIA) 10 MG tablet, Take 1 tablet (10 mg total) by mouth daily., Disp: 90 tablet, Rfl: 3   gabapentin (NEURONTIN) 300 MG capsule, Take 300 mg by mouth 3 (three) times daily., Disp: , Rfl:    levETIRAcetam (KEPPRA) 250 MG tablet, Take 1 tablet (250 mg total) by mouth 2 (two) times daily. (Patient taking differently: Take 250 mg by mouth 2 (two) times daily. 1/2 tablet twice daily), Disp: 180 tablet, Rfl: 3   Lifitegrast (XIIDRA) 5 % SOLN, Apply 1 drop to eye daily as needed (dry eyes)., Disp: , Rfl:    linaclotide (LINZESS) 72 MCG capsule, Take 1 capsule (72 mcg total) by mouth daily before breakfast., Disp: 30 capsule, Rfl: 2   loratadine (CLARITIN) 10 MG tablet, Take 10 mg by mouth daily. , Disp: , Rfl:    meclizine (ANTIVERT) 25 MG tablet, Take 1 tablet (25 mg total) by mouth 2 (two) times daily as needed for dizziness., Disp: 30 tablet, Rfl: 0   metoprolol succinate (TOPROL-XL) 100 MG 24 hr tablet, TAKE 1 TABLET BY MOUTH INTHE MORNING AND AT BEDTIME. TAKE WITH OR IMMEDIATELY FOLLOWING A MEAL. (Patient taking differently: Take 100 mg by mouth in the morning and at bedtime.), Disp: 180 tablet, Rfl: 1   ofloxacin (OCUFLOX) 0.3 % ophthalmic solution, Place 1 drop into the left eye 4 (four) times daily., Disp: 5 mL, Rfl: 0   pantoprazole (PROTONIX) 40 MG tablet, Take 1 tablet (40 mg total) by mouth daily., Disp: 30 tablet, Rfl: 1   rosuvastatin (CRESTOR) 40 MG tablet, TAKE ONE TABLET BY MOUTH ONCE DAILY., Disp: 90 tablet, Rfl: 2   topiramate (TOPAMAX) 50 MG tablet, Take 1 tablet (50 mg total) by mouth 2 (two) times daily., Disp:  90 tablet, Rfl: 1   traMADol (  ULTRAM) 50 MG tablet, Take 0.5-1 tablets (25-50 mg total) by mouth every 12 (twelve) hours as needed for severe pain., Disp: 15 tablet, Rfl: 0   UNABLE TO FIND, Flutter value- acapella device- use as instructed, Disp: 1 each, Rfl: 0   UNABLE TO FIND, 1 each by Does not apply route daily. Med Name: Blood pressure cuff, Disp: 1 each, Rfl: 0   apixaban (ELIQUIS) 5 MG TABS tablet, Take 1 tablet (5 mg total) by mouth 2 (two) times daily. Start after completing starter pack, Disp: 60 tablet, Rfl: 5   Allergies: Allergies  Allergen Reactions   Tape Rash    Zio monitor adhesive causes ulcerated and infected skin .Had to see derm    REVIEW OF SYSTEMS:   Review of Systems  Constitutional:  Negative for chills, fatigue and fever.  HENT:   Negative for lump/mass, mouth sores, nosebleeds, sore throat and trouble swallowing.   Eyes:  Negative for eye problems.  Respiratory:  Negative for cough and shortness of breath.   Cardiovascular:  Negative for chest pain, leg swelling and palpitations.  Gastrointestinal:  Positive for constipation. Negative for abdominal pain, diarrhea, nausea and vomiting.  Genitourinary:  Negative for bladder incontinence, difficulty urinating, dysuria, frequency, hematuria and nocturia.   Musculoskeletal:  Negative for arthralgias, back pain, flank pain, myalgias and neck pain.  Skin:  Negative for itching and rash.  Neurological:  Positive for dizziness. Negative for headaches and numbness.  Hematological:  Does not bruise/bleed easily.  Psychiatric/Behavioral:  Negative for depression, sleep disturbance and suicidal ideas. The patient is not nervous/anxious.   All other systems reviewed and are negative.    VITALS:   Blood pressure (!) 152/76, pulse 64, temperature 98.7 F (37.1 C), temperature source Oral, resp. rate 16, weight 154 lb 9.6 oz (70.1 kg), SpO2 98%.  Wt Readings from Last 3 Encounters:  12/19/22 154 lb 9.6 oz (70.1 kg)   11/30/22 155 lb (70.3 kg)  11/16/22 155 lb (70.3 kg)    Body mass index is 26.54 kg/m.  Performance status (ECOG): 1 - Symptomatic but completely ambulatory  PHYSICAL EXAM:   Physical Exam Vitals and nursing note reviewed. Exam conducted with a chaperone present.  Constitutional:      Appearance: Normal appearance.  Cardiovascular:     Rate and Rhythm: Normal rate and regular rhythm.     Pulses: Normal pulses.     Heart sounds: Normal heart sounds.  Pulmonary:     Effort: Pulmonary effort is normal.     Breath sounds: Normal breath sounds.  Abdominal:     Palpations: Abdomen is soft. There is no hepatomegaly, splenomegaly or mass.     Tenderness: There is no abdominal tenderness.  Musculoskeletal:     Right lower leg: No edema.     Left lower leg: No edema.  Lymphadenopathy:     Cervical: No cervical adenopathy.     Right cervical: No superficial, deep or posterior cervical adenopathy.    Left cervical: No superficial, deep or posterior cervical adenopathy.     Upper Body:     Right upper body: No supraclavicular or axillary adenopathy.     Left upper body: No supraclavicular or axillary adenopathy.  Neurological:     General: No focal deficit present.     Mental Status: She is alert and oriented to person, place, and time.  Psychiatric:        Mood and Affect: Mood normal.        Behavior: Behavior  normal.     LABS:      Latest Ref Rng & Units 12/12/2022    2:49 PM 10/17/2022    2:44 PM 10/06/2022    3:30 AM  CBC  WBC 4.0 - 10.5 K/uL 3.9  6.4  14.0   Hemoglobin 12.0 - 15.0 g/dL 19.1  47.8  29.5   Hematocrit 36.0 - 46.0 % 39.8  38.1  44.2   Platelets 150 - 400 K/uL 195  294  225       Latest Ref Rng & Units 12/12/2022    2:49 PM 10/17/2022    2:44 PM 10/05/2022    5:08 AM  CMP  Glucose 70 - 99 mg/dL 621  71  308   BUN 8 - 23 mg/dL 20  23  29    Creatinine 0.44 - 1.00 mg/dL 6.57  8.46  9.62   Sodium 135 - 145 mmol/L 141  146  137   Potassium 3.5 - 5.1 mmol/L  3.3  4.1  3.6   Chloride 98 - 111 mmol/L 109  109  108   CO2 22 - 32 mmol/L 24  26  19    Calcium 8.9 - 10.3 mg/dL 9.1  8.6  8.8   Total Protein 6.5 - 8.1 g/dL 7.0   7.4   Total Bilirubin 0.3 - 1.2 mg/dL 0.5   0.5   Alkaline Phos 38 - 126 U/L 78   74   AST 15 - 41 U/L 40   36   ALT 0 - 44 U/L 50   47      Lab Results  Component Value Date   CEA1 2.3 04/16/2015   CEA 0.7 04/16/2015   /  CEA  Date Value Ref Range Status  04/16/2015 2.3 0.0 - 4.7 ng/mL Final    Comment:                          Roche ECLIA methodology       Nonsmokers  <3.9                                                     Smokers     <5.6   04/16/2015 0.7 0.0 - 5.0 ng/mL Final   No results found for: "PSA1" No results found for: "XBM841" No results found for: "CAN125"  Lab Results  Component Value Date   TOTALPROTELP 7.0 05/15/2018   ALBUMINELP 3.6 05/15/2018   A1GS 0.2 05/15/2018   A2GS 0.7 05/15/2018   BETS 1.2 05/15/2018   GAMS 1.2 05/15/2018   MSPIKE Not Observed 05/15/2018   SPEI Comment 05/15/2018   No results found for: "TIBC", "FERRITIN", "IRONPCTSAT" Lab Results  Component Value Date   LDH 159 02/08/2021   LDH 160 08/03/2020   LDH 152 01/28/2020     STUDIES:   No results found.

## 2022-12-19 NOTE — Patient Instructions (Addendum)
[facility] at [facility] Discharge Instructions   You were seen and examined today by Dr. Ellin Saba.  He reviewed the results of your lab work which are normal/stable. Your D-dimer (the blood clot test) was negative.   Given that the blood clot you had was unprovoked (meaning there is no good explanation for the reason for the blood clot) Dr. Kirtland Bouchard recommends you stay on blood thinner for a long time. Continue Eliquis as prescribed.   We will see you back in 6 months. We will repeat lab work prior to this visit.   Return as scheduled.      Thank you for choosing [facility] at [facility] to provide your oncology and hematology care.  To afford each patient quality time with our provider, please arrive at least 15 minutes before your scheduled appointment time.   If you have a lab appointment with the Cancer Center please come in thru the Main Entrance and check in at the main information desk.  You need to re-schedule your appointment should you arrive 10 or more minutes late.  We strive to give you quality time with our providers, and arriving late affects you and other patients whose appointments are after yours.  Also, if you no show three or more times for appointments you may be dismissed from the clinic at the providers discretion.     Again, thank you for choosing [facility].  Our hope is that these requests will decrease the amount of time that you wait before being seen by our physicians.       _____________________________________________________________  Should you have questions after your visit to [facility], please contact our office at [phone] and follow the prompts.  Our office hours are 8:00 a.m. and 4:30 p.m. Monday - Friday.  Please note that voicemails left after 4:00 p.m. may not be returned until the following business day.  We are closed weekends and major holidays.  You do have access to a  nurse 24-7, just call the main number to the clinic [phone] and do not press any options, hold on the line and a nurse will answer the phone.    For prescription refill requests, have your pharmacy contact our office and allow 72 hours.    Due to Covid, you will need to wear a mask upon entering the hospital. If you do not have a mask, a mask will be given to you at the Main Entrance upon arrival. For doctor visits, patients may have 1 support person age 31 or older with them. For treatment visits, patients can not have anyone with them due to social distancing guidelines and our immunocompromised population.

## 2022-12-24 ENCOUNTER — Encounter: Payer: Self-pay | Admitting: Cardiology

## 2022-12-26 ENCOUNTER — Other Ambulatory Visit: Payer: Self-pay

## 2022-12-26 DIAGNOSIS — E870 Hyperosmolality and hypernatremia: Secondary | ICD-10-CM | POA: Diagnosis not present

## 2022-12-26 DIAGNOSIS — N1832 Chronic kidney disease, stage 3b: Secondary | ICD-10-CM | POA: Diagnosis not present

## 2022-12-26 DIAGNOSIS — I129 Hypertensive chronic kidney disease with stage 1 through stage 4 chronic kidney disease, or unspecified chronic kidney disease: Secondary | ICD-10-CM | POA: Diagnosis not present

## 2022-12-26 DIAGNOSIS — E1122 Type 2 diabetes mellitus with diabetic chronic kidney disease: Secondary | ICD-10-CM | POA: Diagnosis not present

## 2022-12-26 MED ORDER — DILTIAZEM HCL 30 MG PO TABS: 30.0000 mg | ORAL_TABLET | Freq: Three times a day (TID) | ORAL | 2 refills | Status: DC | PRN

## 2022-12-31 ENCOUNTER — Encounter: Payer: Self-pay | Admitting: Cardiology

## 2023-01-07 ENCOUNTER — Encounter: Payer: Self-pay | Admitting: Neurology

## 2023-01-09 ENCOUNTER — Other Ambulatory Visit: Payer: Self-pay | Admitting: Neurology

## 2023-01-09 MED ORDER — GABAPENTIN 300 MG PO CAPS: 300.0000 mg | ORAL_CAPSULE | Freq: Two times a day (BID) | ORAL | 0 refills | Status: DC

## 2023-01-11 ENCOUNTER — Encounter (INDEPENDENT_AMBULATORY_CARE_PROVIDER_SITE_OTHER): Payer: Medicare Other | Admitting: Ophthalmology

## 2023-01-11 DIAGNOSIS — H35033 Hypertensive retinopathy, bilateral: Secondary | ICD-10-CM

## 2023-01-11 DIAGNOSIS — I10 Essential (primary) hypertension: Secondary | ICD-10-CM | POA: Diagnosis not present

## 2023-01-11 DIAGNOSIS — H34831 Tributary (branch) retinal vein occlusion, right eye, with macular edema: Secondary | ICD-10-CM

## 2023-01-11 DIAGNOSIS — H43813 Vitreous degeneration, bilateral: Secondary | ICD-10-CM

## 2023-01-23 ENCOUNTER — Ambulatory Visit (INDEPENDENT_AMBULATORY_CARE_PROVIDER_SITE_OTHER): Payer: Medicare Other | Admitting: Internal Medicine

## 2023-01-23 ENCOUNTER — Encounter: Payer: Self-pay | Admitting: Internal Medicine

## 2023-01-23 VITALS — BP 138/76 | HR 63 | Ht 64.0 in | Wt 154.0 lb

## 2023-01-23 DIAGNOSIS — Z0001 Encounter for general adult medical examination with abnormal findings: Secondary | ICD-10-CM

## 2023-01-23 DIAGNOSIS — G40209 Localization-related (focal) (partial) symptomatic epilepsy and epileptic syndromes with complex partial seizures, not intractable, without status epilepticus: Secondary | ICD-10-CM

## 2023-01-23 DIAGNOSIS — I10 Essential (primary) hypertension: Secondary | ICD-10-CM | POA: Diagnosis not present

## 2023-01-23 DIAGNOSIS — N1832 Chronic kidney disease, stage 3b: Secondary | ICD-10-CM | POA: Diagnosis not present

## 2023-01-23 DIAGNOSIS — R7303 Prediabetes: Secondary | ICD-10-CM | POA: Diagnosis not present

## 2023-01-23 DIAGNOSIS — Z23 Encounter for immunization: Secondary | ICD-10-CM

## 2023-01-23 DIAGNOSIS — G8194 Hemiplegia, unspecified affecting left nondominant side: Secondary | ICD-10-CM

## 2023-01-23 DIAGNOSIS — I2699 Other pulmonary embolism without acute cor pulmonale: Secondary | ICD-10-CM

## 2023-01-23 DIAGNOSIS — E782 Mixed hyperlipidemia: Secondary | ICD-10-CM

## 2023-01-23 DIAGNOSIS — K5904 Chronic idiopathic constipation: Secondary | ICD-10-CM | POA: Diagnosis not present

## 2023-01-23 DIAGNOSIS — G609 Hereditary and idiopathic neuropathy, unspecified: Secondary | ICD-10-CM | POA: Insufficient documentation

## 2023-01-23 MED ORDER — GABAPENTIN 300 MG PO CAPS
300.0000 mg | ORAL_CAPSULE | Freq: Two times a day (BID) | ORAL | 4 refills | Status: DC
Start: 2023-01-23 — End: 2023-07-10

## 2023-01-23 NOTE — Assessment & Plan Note (Signed)
Continue Gabapentin 300 mg BID - refilled

## 2023-01-23 NOTE — Assessment & Plan Note (Signed)
Usually well-controlled with Linzess Can take Dulcolax or Colace as needed for now Maintain adequate hydration

## 2023-01-23 NOTE — Progress Notes (Signed)
Established Patient Office Visit  Subjective:  Patient ID: [name], female    DOB: 1934/06/26  Age: 88 y.o. MRN: [id number]  CC:  Chief Complaint  Patient presents with   Annual Exam    HPI [name] is a 88 y.o. female with past medical history of HTN, prediabetes, CVA with residual left sided numbness and weakness, ?partial seizure, hypothyroidism, colon ca. s/p right hemicolectomy, chronic constipation, PE and CKD stage 3 who presents for annual physical.  HTN: BP is well-controlled. Takes medications regularly. Patient denies headache, chest pain, dyspnea or palpitations.  She has dizziness upon sudden change in position, especially while standing up.  She had hematology evaluation for recent PE.  She has been advised to take Eliquis indefinitely due to recurrent PE.   Her leg swelling has improved with leg elevation and compression stockings.  She still has mild ankle swelling bilaterally.   She follows up with Dr Wolfgang Phoenix for CKD stage 3.   She takes levothyroxine for hypothyroidism.  She denies any recent change in weight or appetite.  She follows up with neurology for history of CVA and seizures.  She currently takes Keppra 125 mg twice daily and topiramate 50 mg twice daily.  She has history of peripheral neuropathy, had glovelike sensation in her UE, and has been taking gabapentin 300 mg 3 times daily, which was recently reduced to twice daily by neurology.   Past Medical History:  Diagnosis Date   Allergy    Arthritis    Colon cancer (HCC)    colon ca dx 07/30/09   History of cardiac monitoring 07/2017   "Event monitor demonstrated sinus rhythm with isolated PACs and no arrhythmias"   History of colon cancer 06/2009   found at time of TCS 06/29/09, 1.2cm sessile cecal polyp, no adjuvent therapy needed   HTN (hypertension)    Hx of cardiovascular stress test 07/2017   "No diagnostic ST segment changes to indicate ischemia. Small, moderate  intensity, reversible apical to basal inferolateral defect consistent with ischemia. This is a low risk study. Nuclear stress EF: 84%."   Hyperlipidemia    Hypothyroidism    PE (pulmonary thromboembolism) (HCC)    Renal disorder    cyst on kidney    Silent micro-hemorrhage of brain (HCC) 11/25/2018   Stroke [facility])    TIA (transient ischemic attack) 11/25/2018   Vertigo     Past Surgical History:  Procedure Laterality Date   ABDOMINAL HYSTERECTOMY     COLON SURGERY  07/2009   right hemicolectomy, no residual colon cancer on path   COLONOSCOPY  07/16/2010   [id number] POLYP-TCS 3 YEARS   COLONOSCOPY N/A 08/02/2013   hyperplastic polyps, surveillance in 2020 if benefits outweight the risks   COLONOSCOPY  06/2009   1.2 cm sessile cecal polyp which had adenocarcinoma arising in a tubular adenoma.   PARTIAL THYMECTOMY     partial thyroidectomy     benign tumors    Family History  Problem Relation Age of Onset   Colon cancer Mother        >age28   Arthritis Mother    Cancer Mother    Heart disease Mother    Hyperlipidemia Mother    Hypertension Mother    Heart attack Father    Heart disease Father    Diabetes Maternal Aunt    Hyperlipidemia Daughter    Hypertension Daughter    Liver disease Neg Hx     Social History   Socioeconomic  History   Marital status: Widowed    Spouse name: Not on file   Number of children: 2   Years of education: Not on file   Highest education level: Not on file  Occupational History   Occupation: Agricultural consultant at [employer]    Employer: RETIRED   Occupation: retired from Designer, fashion/clothing  Tobacco Use   Smoking status: Never   Smokeless tobacco: Never  Vaping Use   Vaping status: Never Used  Substance and Sexual Activity   Alcohol use: No    Alcohol/week: 0.0 standard drinks of alcohol   Drug use: No   Sexual activity: Not Currently  Other Topics Concern   Not on file  Social History Narrative   Not on file   Social Determinants of Health    Financial Resource Strain: Low Risk  (11/30/2022)   Overall Financial Resource Strain (CARDIA)    Difficulty of Paying Living Expenses: Not hard at all  Food Insecurity: No Food Insecurity (11/30/2022)   Hunger Vital Sign    Worried About Running Out of Food in the Last Year: Never true    Ran Out of Food in the Last Year: Never true  Transportation Needs: No Transportation Needs (11/30/2022)   PRAPARE - Administrator, Civil Service (Medical): No    Lack of Transportation (Non-Medical): No  Physical Activity: Insufficiently Active (11/30/2022)   Exercise Vital Sign    Days of Exercise per Week: 4 days    Minutes of Exercise per Session: 10 min  Stress: No Stress Concern Present (11/30/2022)   [employer] of Occupational Health - Occupational Stress Questionnaire    Feeling of Stress : Only a little  Social Connections: Moderately Isolated (11/30/2022)   Social Connection and Isolation Panel [NHANES]    Frequency of Communication with Friends and Family: More than three times a week    Frequency of Social Gatherings with Friends and Family: More than three times a week    Attends Religious Services: More than 4 times per year    Active Member of [employer] or Organizations: No    Attends Banker Meetings: Never    Marital Status: Widowed  Intimate Partner Violence: Not At Risk (11/30/2022)   Humiliation, Afraid, Rape, and Kick questionnaire    Fear of Current or Ex-Partner: No    Emotionally Abused: No    Physically Abused: No    Sexually Abused: No    Outpatient Medications Prior to Visit  Medication Sig Dispense Refill   chlorthalidone (HYGROTON) 25 MG tablet Take 12.5 mg by mouth daily.     acetaminophen (TYLENOL) 500 MG tablet Take 500 mg by mouth every 8 (eight) hours as needed for mild pain or headache.     apixaban (ELIQUIS) 5 MG TABS tablet Take 1 tablet (5 mg total) by mouth 2 (two) times daily. Start after completing starter pack 60 tablet 5    ascorbic acid (VITAMIN C) 250 MG CHEW Chew 250 mg by mouth daily.     ASPERCREME LIDOCAINE EX Apply 1 application topically daily as needed (for knee pain).      cholecalciferol (VITAMIN D3) 25 MCG (1000 UT) tablet Take 1,000 Units by mouth daily.      diltiazem (CARDIZEM CD) 180 MG 24 hr capsule Take 1 capsule (180 mg total) by mouth daily. 90 capsule 3   diltiazem (CARDIZEM) 30 MG tablet Take 1 tablet (30 mg total) by mouth every 8 (eight) hours as needed (Palpitations). 60 tablet 2  ezetimibe (ZETIA) 10 MG tablet Take 1 tablet (10 mg total) by mouth daily. 90 tablet 3   levETIRAcetam (KEPPRA) 250 MG tablet Take 1 tablet (250 mg total) by mouth 2 (two) times daily. (Patient taking differently: Take 250 mg by mouth 2 (two) times daily. 1/2 tablet twice daily) 180 tablet 3   Lifitegrast (XIIDRA) 5 % SOLN Apply 1 drop to eye daily as needed (dry eyes).     linaclotide (LINZESS) 72 MCG capsule Take 1 capsule (72 mcg total) by mouth daily before breakfast. 30 capsule 2   loratadine (CLARITIN) 10 MG tablet Take 10 mg by mouth daily.      meclizine (ANTIVERT) 25 MG tablet Take 1 tablet (25 mg total) by mouth 2 (two) times daily as needed for dizziness. 30 tablet 0   metoprolol succinate (TOPROL-XL) 100 MG 24 hr tablet TAKE 1 TABLET BY MOUTH INTHE MORNING AND AT BEDTIME. TAKE WITH OR IMMEDIATELY FOLLOWING A MEAL. (Patient taking differently: Take 100 mg by mouth in the morning and at bedtime.) 180 tablet 1   pantoprazole (PROTONIX) 40 MG tablet Take 1 tablet (40 mg total) by mouth daily. 30 tablet 1   rosuvastatin (CRESTOR) 40 MG tablet TAKE ONE TABLET BY MOUTH ONCE DAILY. 90 tablet 2   topiramate (TOPAMAX) 50 MG tablet Take 1 tablet (50 mg total) by mouth 2 (two) times daily. 90 tablet 1   traMADol (ULTRAM) 50 MG tablet Take 0.5-1 tablets (25-50 mg total) by mouth every 12 (twelve) hours as needed for severe pain. 15 tablet 0   UNABLE TO FIND Flutter value- acapella device- use as instructed 1 each 0    UNABLE TO FIND 1 each by Does not apply route daily. Med Name: Blood pressure cuff 1 each 0   gabapentin (NEURONTIN) 300 MG capsule Take 1 capsule (300 mg total) by mouth 2 (two) times daily. 60 capsule 0   ofloxacin (OCUFLOX) 0.3 % ophthalmic solution Place 1 drop into the left eye 4 (four) times daily. 5 mL 0   No facility-administered medications prior to visit.    Allergies  Allergen Reactions   Tape Rash    Zio monitor adhesive causes ulcerated and infected skin .Had to see derm    ROS Review of Systems  Constitutional:  Negative for chills and fever.  HENT:  Negative for congestion, sinus pressure, sinus pain and sore throat.   Eyes:  Negative for pain and discharge.  Respiratory:  Positive for cough. Negative for shortness of breath.   Cardiovascular:  Positive for leg swelling. Negative for chest pain and palpitations.  Gastrointestinal:  Negative for abdominal pain, constipation, diarrhea, nausea and vomiting.  Endocrine: Negative for polydipsia and polyuria.  Genitourinary:  Negative for dysuria and hematuria.  Musculoskeletal:  Positive for arthralgias (L knee). Negative for neck pain and neck stiffness.  Skin:  Negative for rash.  Neurological:  Positive for dizziness. Negative for weakness.  Psychiatric/Behavioral:  Negative for agitation and behavioral problems.       Objective:    Physical Exam Vitals reviewed.  Constitutional:      General: She is not in acute distress.    Appearance: She is not diaphoretic.  HENT:     Right Ear: Ear canal normal.     Left Ear: Ear canal normal.     Mouth/Throat:     Mouth: Mucous membranes are moist.     Pharynx: No posterior oropharyngeal erythema.  Eyes:     General: No scleral icterus.  Extraocular Movements: Extraocular movements intact.  Cardiovascular:     Rate and Rhythm: Normal rate and regular rhythm.     Heart sounds: Normal heart sounds. No murmur heard. Pulmonary:     Breath sounds: Normal breath  sounds. No wheezing or rales.  Abdominal:     Palpations: Abdomen is soft.     Tenderness: There is no abdominal tenderness.  Musculoskeletal:     Cervical back: Neck supple. No tenderness.     Left knee: Swelling present. Decreased range of motion. Tenderness present over the medial joint line.     Right lower leg: No edema.     Left lower leg: No edema.  Skin:    General: Skin is warm.     Findings: No rash.  Neurological:     General: No focal deficit present.     Mental Status: She is alert and oriented to person, place, and time.     Sensory: Sensory deficit (Left UE) present.     Motor: Weakness (Left UE and LE - muscle strength 4/5) present.  Psychiatric:        Mood and Affect: Mood normal.        Behavior: Behavior normal.     BP 138/76 (BP Location: Left Arm)   Pulse 63   Ht 5\' 4"  (1.626 m)   Wt 154 lb (69.9 kg)   SpO2 98%   BMI 26.43 kg/m  Wt Readings from Last 3 Encounters:  01/23/23 154 lb (69.9 kg)  12/19/22 154 lb 9.6 oz (70.1 kg)  11/30/22 155 lb (70.3 kg)    Lab Results  Component Value Date   TSH 0.164 (L) 10/17/2022   Lab Results  Component Value Date   WBC 3.9 (L) 12/12/2022   HGB 12.7 12/12/2022   HCT 39.8 12/12/2022   MCV 100.5 (H) 12/12/2022   PLT 195 12/12/2022   Lab Results  Component Value Date   NA 141 12/12/2022   K 3.3 (L) 12/12/2022   CHLORIDE 105 04/16/2015   CO2 24 12/12/2022   GLUCOSE 116 (H) 12/12/2022   BUN 20 12/12/2022   CREATININE 1.68 (H) 12/12/2022   BILITOT 0.5 12/12/2022   ALKPHOS 78 12/12/2022   AST 40 12/12/2022   ALT 50 (H) 12/12/2022   PROT 7.0 12/12/2022   ALBUMIN 3.6 12/12/2022   CALCIUM 9.1 12/12/2022   ANIONGAP 8 12/12/2022   EGFR 26 (L) 10/17/2022   Lab Results  Component Value Date   CHOL 157 02/07/2022   Lab Results  Component Value Date   HDL 64 02/07/2022   Lab Results  Component Value Date   LDLCALC 77 02/07/2022   Lab Results  Component Value Date   TRIG 85 02/07/2022   Lab  Results  Component Value Date   CHOLHDL 2.5 02/07/2022   Lab Results  Component Value Date   HGBA1C 6.1 (H) 07/21/2022      Assessment & Plan:   Problem List Items Addressed This Visit       Cardiovascular and Mediastinum   Essential hypertension    BP Readings from Last 1 Encounters:  01/23/23 138/76   Well-controlled with Metoprolol and Cardizem Needs to start chlorthalidone 12.5 mg QD as prescribed by nephrology Counseled for compliance with the medications Advised DASH diet and moderate exercise/walking as tolerated      Relevant Medications   chlorthalidone (HYGROTON) 25 MG tablet   Other Relevant Orders   TSH + free T4   Pulmonary embolism (HCC)  Now on Eliquis - since 10/06/22 Had right heart strain and elevated BP - BP wnl today, appears euvolemic Had hematology evaluation-recommend indefinite AC with Eliquis      Relevant Medications   chlorthalidone (HYGROTON) 25 MG tablet     Nervous and Auditory   Left hemiparesis (HCC)    H/o multiple TIAs On statin, recently placed on Eliquis due to PE (was on Plavix) F/u with Neurology      Idiopathic peripheral neuropathy    Continue Gabapentin 300 mg BID - refilled      Relevant Medications   gabapentin (NEURONTIN) 300 MG capsule   Other Relevant Orders   TSH + free T4     Genitourinary   Chronic kidney disease, stage 3b (HCC)    Last CMP reviewed, CKD stage 3b, stable GFR in 30s F/u with Nephrology Avoid nephrotoxic agents No proteinuria currently according to Nephrology chart        Other   Constipation    Usually well-controlled with Linzess Can take Dulcolax or Colace as needed for now Maintain adequate hydration      Relevant Orders   TSH + free T4   Mixed hyperlipidemia    On Crestor now      Relevant Medications   chlorthalidone (HYGROTON) 25 MG tablet   Complex partial seizure (HCC)    Denies any recent seizure-like activity Followed [facility] neurology On Keppra and  Topamax      Relevant Medications   gabapentin (NEURONTIN) 300 MG capsule   Prediabetes    Lab Results  Component Value Date   HGBA1C 6.1 (H) 07/21/2022   Diet controlled      Relevant Orders   Hemoglobin A1c   Encounter for general adult medical examination with abnormal findings - Primary    Physical exam as documented. Fasting blood tests ordered. Flu vaccine today.      Other Visit Diagnoses     Encounter for immunization       Relevant Orders   Flu Vaccine Trivalent High Dose (Fluad) (Completed)       Meds ordered this encounter  Medications   gabapentin (NEURONTIN) 300 MG capsule    Sig: Take 1 capsule (300 mg total) by mouth 2 (two) times daily.    Dispense:  60 capsule    Refill:  4    Follow-up: Return in about 6 months (around 07/23/2023) for HTN and CKD.    Anabel Halon, MD

## 2023-01-23 NOTE — Assessment & Plan Note (Signed)
On Crestor now

## 2023-01-23 NOTE — Patient Instructions (Addendum)
Please continue to take medications as prescribed.  Please continue to follow low carb diet and ambulate as tolerated.  Please consider getting Shingrix and Tdap vaccine at local pharmacy.

## 2023-01-23 NOTE — Assessment & Plan Note (Signed)
Physical exam as documented. Fasting blood tests ordered. Flu vaccine today. 

## 2023-01-23 NOTE — Assessment & Plan Note (Signed)
Denies any recent seizure-like activity Followed by [facility] neurology On Keppra and Topamax

## 2023-01-23 NOTE — Assessment & Plan Note (Addendum)
BP Readings from Last 1 Encounters:  01/23/23 138/76   Well-controlled with Metoprolol and Cardizem Needs to start chlorthalidone 12.5 mg QD as prescribed by nephrology Counseled for compliance with the medications Advised DASH diet and moderate exercise/walking as tolerated

## 2023-01-23 NOTE — Assessment & Plan Note (Signed)
Now on Eliquis - since 10/06/22 Had right heart strain and elevated BP - BP wnl today, appears euvolemic Had hematology evaluation-recommend indefinite AC with Eliquis

## 2023-01-23 NOTE — Assessment & Plan Note (Signed)
Lab Results  Component Value Date   HGBA1C 6.1 (H) 07/21/2022   Diet controlled

## 2023-01-23 NOTE — Assessment & Plan Note (Signed)
H/o multiple TIAs On statin, recently placed on Eliquis due to PE (was on Plavix) F/u with Neurology

## 2023-01-23 NOTE — Assessment & Plan Note (Signed)
Last CMP reviewed, CKD stage 3b, stable GFR in 30s F/u with Nephrology Avoid nephrotoxic agents No proteinuria currently according to Nephrology chart 

## 2023-01-29 ENCOUNTER — Other Ambulatory Visit: Payer: Self-pay | Admitting: Internal Medicine

## 2023-01-30 ENCOUNTER — Encounter (HOSPITAL_COMMUNITY): Payer: Self-pay | Admitting: Emergency Medicine

## 2023-01-30 ENCOUNTER — Emergency Department (HOSPITAL_COMMUNITY)
Admission: EM | Admit: 2023-01-30 | Discharge: 2023-01-30 | Discharge status: Against Medical Advice | Payer: Medicare Other | Attending: Emergency Medicine | Admitting: Emergency Medicine

## 2023-01-30 ENCOUNTER — Other Ambulatory Visit: Payer: Self-pay

## 2023-01-30 DIAGNOSIS — H9202 Otalgia, left ear: Secondary | ICD-10-CM | POA: Insufficient documentation

## 2023-01-30 DIAGNOSIS — Z5321 Procedure and treatment not carried out due to patient leaving prior to being seen by health care provider: Secondary | ICD-10-CM | POA: Diagnosis not present

## 2023-01-30 NOTE — ED Triage Notes (Signed)
Pt c/o left ear pain with pain into left side of head x 2-3 days, decreased hearing to left ear

## 2023-02-02 DIAGNOSIS — R42 Dizziness and giddiness: Secondary | ICD-10-CM | POA: Diagnosis not present

## 2023-02-02 DIAGNOSIS — H6123 Impacted cerumen, bilateral: Secondary | ICD-10-CM | POA: Diagnosis not present

## 2023-02-07 ENCOUNTER — Encounter (INDEPENDENT_AMBULATORY_CARE_PROVIDER_SITE_OTHER): Payer: Medicare Other | Admitting: Ophthalmology

## 2023-02-13 ENCOUNTER — Ambulatory Visit: Payer: Medicare Other | Attending: Cardiology | Admitting: Cardiology

## 2023-02-13 ENCOUNTER — Encounter: Payer: Self-pay | Admitting: Cardiology

## 2023-02-13 VITALS — BP 136/76 | HR 66 | Wt 152.0 lb

## 2023-02-13 DIAGNOSIS — R002 Palpitations: Secondary | ICD-10-CM | POA: Diagnosis not present

## 2023-02-13 DIAGNOSIS — I10 Essential (primary) hypertension: Secondary | ICD-10-CM

## 2023-02-13 DIAGNOSIS — E782 Mixed hyperlipidemia: Secondary | ICD-10-CM | POA: Diagnosis not present

## 2023-02-13 NOTE — Progress Notes (Signed)
Clinical Summary [name] is a 88 y.o.female seen today for follow up of the following medical problems.    1. Palpitations - prior monitor showed PACs, PVCs, short runs of SVT longest 11 seconds  -severe allergy to monitor adhesive     - palpitations about once a week, usually around 3 AM. Will take a prn diltiazem and symptoms will resolve    2. HTN - compliant with meds   3. Unprovoked Pulmonary embolus 04/26/18 -  followed by intracranial hemorrhage on eliquis which was stopped  -admit 09/2022 recurrent PE - was started on anticoag per hospital notes, notes mention neurology consulted but I do not see there note.  - managed by hematology as outpatient   4. Hyperlipidemia - 09/2019 TC 130 HDL 46 TG 107 LDL 65 - 12/2020 TC 122 TG 43 HDL 52 LDL 61 - 07/2021 TC 409 TG 28 HDL 62 LDL 76 - 01/2022 TC 157 TG 85 HDL 64 LDL 77 - she is on crestor 40mg  daily. Last visit we added zetia 10mg  daily.      5. History of seizures - 12/2020 admitted with AMS, thought to be postictal from seizure   6. TIA - changed from ASA to plavix 07/2021 - changed from plavix to eliquis after 09/2022 admit with PE Past Medical History:  Diagnosis Date   Allergy    Arthritis    Colon cancer (HCC)    colon ca dx 07/30/09   History of cardiac monitoring 07/2017   "Event monitor demonstrated sinus rhythm with isolated PACs and no arrhythmias"   History of colon cancer 06/2009   found at time of TCS 06/29/09, 1.2cm sessile cecal polyp, no adjuvent therapy needed   HTN (hypertension)    Hx of cardiovascular stress test 07/2017   "No diagnostic ST segment changes to indicate ischemia. Small, moderate intensity, reversible apical to basal inferolateral defect consistent with ischemia. This is a low risk study. Nuclear stress EF: 84%."   Hyperlipidemia    Hypothyroidism    PE (pulmonary thromboembolism) (HCC)    Renal disorder    cyst on kidney    Silent micro-hemorrhage of brain (HCC)  11/25/2018   Stroke (HCC)    TIA (transient ischemic attack) 11/25/2018   Vertigo      Allergies  Allergen Reactions   Tape Rash    Zio monitor adhesive causes ulcerated and infected skin .Had to see derm     Current Outpatient Medications  Medication Sig Dispense Refill   acetaminophen (TYLENOL) 500 MG tablet Take 500 mg by mouth every 8 (eight) hours as needed for mild pain or headache.     apixaban (ELIQUIS) 5 MG TABS tablet Take 1 tablet (5 mg total) by mouth 2 (two) times daily. Start after completing starter pack 60 tablet 5   ascorbic acid (VITAMIN C) 250 MG CHEW Chew 250 mg by mouth daily.     ASPERCREME LIDOCAINE EX Apply 1 application topically daily as needed (for knee pain).      chlorthalidone (HYGROTON) 25 MG tablet Take 12.5 mg by mouth daily.     cholecalciferol (VITAMIN D3) 25 MCG (1000 UT) tablet Take 1,000 Units by mouth daily.      diltiazem (CARDIZEM CD) 180 MG 24 hr capsule Take 1 capsule (180 mg total) by mouth daily. 90 capsule 3   diltiazem (CARDIZEM) 30 MG tablet Take 1 tablet (30 mg total) by mouth every 8 (eight) hours as needed (Palpitations). 60 tablet 2  ezetimibe (ZETIA) 10 MG tablet Take 1 tablet (10 mg total) by mouth daily. 90 tablet 3   gabapentin (NEURONTIN) 300 MG capsule Take 1 capsule (300 mg total) by mouth 2 (two) times daily. 60 capsule 4   levETIRAcetam (KEPPRA) 250 MG tablet Take 1 tablet (250 mg total) by mouth 2 (two) times daily. (Patient taking differently: Take 250 mg by mouth 2 (two) times daily. 1/2 tablet twice daily) 180 tablet 3   Lifitegrast (XIIDRA) 5 % SOLN Apply 1 drop to eye daily as needed (dry eyes).     linaclotide (LINZESS) 72 MCG capsule Take 1 capsule (72 mcg total) by mouth daily before breakfast. 30 capsule 2   loratadine (CLARITIN) 10 MG tablet Take 10 mg by mouth daily.      meclizine (ANTIVERT) 25 MG tablet Take 1 tablet (25 mg total) by mouth 2 (two) times daily as needed for dizziness. 30 tablet 0   metoprolol  succinate (TOPROL-XL) 100 MG 24 hr tablet TAKE 1 TABLET BY MOUTH INTHE MORNING AND AT BEDTIME. TAKE WITH OR IMMEDIATELY FOLLOWING A MEAL. (Patient taking differently: Take 100 mg by mouth in the morning and at bedtime.) 180 tablet 1   pantoprazole (PROTONIX) 40 MG tablet Take 1 tablet (40 mg total) by mouth daily. 30 tablet 1   rosuvastatin (CRESTOR) 40 MG tablet TAKE ONE TABLET BY MOUTH ONCE DAILY. 90 tablet 2   topiramate (TOPAMAX) 50 MG tablet Take 1 tablet (50 mg total) by mouth 2 (two) times daily. 90 tablet 1   traMADol (ULTRAM) 50 MG tablet Take 0.5-1 tablets (25-50 mg total) by mouth every 12 (twelve) hours as needed for severe pain. 15 tablet 0   UNABLE TO FIND Flutter value- acapella device- use as instructed 1 each 0   UNABLE TO FIND 1 each by Does not apply route daily. Med Name: Blood pressure cuff 1 each 0   No current facility-administered medications for this visit.     Past Surgical History:  Procedure Laterality Date   ABDOMINAL HYSTERECTOMY     COLON SURGERY  07/2009   right hemicolectomy, no residual colon cancer on path   COLONOSCOPY  07/16/2010   GMW:NUUVOZDGUYQI POLYP-TCS 3 YEARS   COLONOSCOPY N/A 08/02/2013   hyperplastic polyps, surveillance in 2020 if benefits outweight the risks   COLONOSCOPY  06/2009   1.2 cm sessile cecal polyp which had adenocarcinoma arising in a tubular adenoma.   PARTIAL THYMECTOMY     partial thyroidectomy     benign tumors     Allergies  Allergen Reactions   Tape Rash    Zio monitor adhesive causes ulcerated and infected skin .Had to see derm      Family History  Problem Relation Age of Onset   Colon cancer Mother        >age26   Arthritis Mother    Cancer Mother    Heart disease Mother    Hyperlipidemia Mother    Hypertension Mother    Heart attack Father    Heart disease Father    Diabetes Maternal Aunt    Hyperlipidemia Daughter    Hypertension Daughter    Liver disease Neg Hx      Social History Ms.  Yaun reports that she has never smoked. She has never used smokeless tobacco. Ms. Kazmer reports no history of alcohol use.   Review of Systems CONSTITUTIONAL: No weight loss, fever, chills, weakness or fatigue.  HEENT: Eyes: No visual loss, blurred vision, double vision or yellow  sclerae.No hearing loss, sneezing, congestion, runny nose or sore throat.  SKIN: No rash or itching.  CARDIOVASCULAR: per hpi RESPIRATORY: No shortness of breath, cough or sputum.  GASTROINTESTINAL: No anorexia, nausea, vomiting or diarrhea. No abdominal pain or blood.  GENITOURINARY: No burning on urination, no polyuria NEUROLOGICAL: No headache, dizziness, syncope, paralysis, ataxia, numbness or tingling in the extremities. No change in bowel or bladder control.  MUSCULOSKELETAL: No muscle, back pain, joint pain or stiffness.  LYMPHATICS: No enlarged nodes. No history of splenectomy.  PSYCHIATRIC: No history of depression or anxiety.  ENDOCRINOLOGIC: No reports of sweating, cold or heat intolerance. No polyuria or polydipsia.  Marland Kitchen   Physical Examination Today's Vitals   02/13/23 1533  BP: 136/76  Pulse: 66  SpO2: 97%  Weight: 152 lb (68.9 kg)   Body mass index is 26.09 kg/m.  Gen: resting comfortably, no acute distress HEENT: no scleral icterus, pupils equal round and reactive, no palptable cervical adenopathy,  CV: RRR, no mrg, no jvd Resp: Clear to auscultation bilaterally GI: abdomen is soft, non-tender, non-distended, normal bowel sounds, no hepatosplenomegaly MSK: extremities are warm, no edema.  Skin: warm, no rash Neuro:  no focal deficits Psych: appropriate affect   Diagnostic Studies  07/2021 echo 1. Left ventricular ejection fraction, by estimation, is 65 to 70%. The  left ventricle has normal function. The left ventricle has no regional  wall motion abnormalities. There is mild concentric left ventricular  hypertrophy. Left ventricular diastolic  parameters are  consistent with Grade I diastolic dysfunction (impaired  relaxation).   2. Right ventricular systolic function is normal. The right ventricular  size is normal. There is mildly elevated pulmonary artery systolic  pressure. The estimated right ventricular systolic pressure is 39.0 mmHg.   3. The mitral valve is grossly normal. Trivial mitral valve  regurgitation.   4. The aortic valve is tricuspid. Aortic valve regurgitation is not  visualized. Aortic valve sclerosis is present, with no evidence of aortic  valve stenosis. Aortic valve mean gradient measures 3.0 mmHg.   5. The inferior vena cava is normal in size with greater than 50%  respiratory variability, suggesting right atrial pressure of 3 mmHg.     09/2022 echo IMPRESSIONS     1. Left ventricular ejection fraction, by estimation, is 60 to 65%. The  left ventricle has normal function. The left ventricle has no regional  wall motion abnormalities. Left ventricular diastolic parameters are  consistent with Grade I diastolic  dysfunction (impaired relaxation).   2. Right ventricular systolic function is normal. The right ventricular  size is normal. There is moderately elevated pulmonary artery systolic  pressure.   3. The mitral valve is normal in structure. Trivial mitral valve  regurgitation. No evidence of mitral stenosis.   4. Tricuspid valve regurgitation is moderate.   5. The aortic valve is tricuspid. Aortic valve regurgitation is not  visualized.     Assessment and Plan   1. Palpitations - prior monitor showed PAC/PVCs and SVT - low TSH, could be playing some role - continue av nodal agents, can try taking additional diltiazem 30mg  before bed to see if helps lower frequency further   2. HTN -she is at goal, continue current meds   3. Hyperlipdiemia -repeat lipid panel     Antoine Poche, M.D.

## 2023-02-13 NOTE — Patient Instructions (Signed)
Medication Instructions:  Your physician recommends that you continue on your current medications as directed. Please refer to the Current Medication list given to you today.  *If you need a refill on your cardiac medications before your next appointment, please call your pharmacy*   Lab Work: Fasting Lipid Panel  If you have labs (blood work) drawn today and your tests are completely normal, you will receive your results only by: MyChart Message (if you have MyChart) OR A paper copy in the mail If you have any lab test that is abnormal or we need to change your treatment, we will call you to review the results.   Testing/Procedures: None   Follow-Up: At [facility], you and your health needs are our priority.  As part of our continuing mission to provide you with exceptional heart care, we have created designated Provider Care Teams.  These Care Teams include your primary Cardiologist (physician) and Advanced Practice Providers (APPs -  Physician Assistants and Nurse Practitioners) who all work together to provide you with the care you need, when you need it.  We recommend signing up for the patient portal called "MyChart".  Sign up information is provided on this After Visit Summary.  MyChart is used to connect with patients for Virtual Visits (Telemedicine).  Patients are able to view lab/test results, encounter notes, upcoming appointments, etc.  Non-urgent messages can be sent to your provider as well.   To learn more about what you can do with MyChart, go to [email or web address].    Your next appointment:   6 month(s)  Provider:   You may see Dina Rich, MD or one of the following Advanced Practice Providers on your designated Care Team:   Randall An, PA-C  Jacolyn Reedy, [place]     Other Instructions

## 2023-02-14 ENCOUNTER — Encounter (INDEPENDENT_AMBULATORY_CARE_PROVIDER_SITE_OTHER): Payer: Medicare Other | Admitting: Ophthalmology

## 2023-02-14 DIAGNOSIS — I10 Essential (primary) hypertension: Secondary | ICD-10-CM | POA: Diagnosis not present

## 2023-02-14 DIAGNOSIS — H35033 Hypertensive retinopathy, bilateral: Secondary | ICD-10-CM | POA: Diagnosis not present

## 2023-02-14 DIAGNOSIS — H43813 Vitreous degeneration, bilateral: Secondary | ICD-10-CM | POA: Diagnosis not present

## 2023-02-14 DIAGNOSIS — H34831 Tributary (branch) retinal vein occlusion, right eye, with macular edema: Secondary | ICD-10-CM

## 2023-02-17 ENCOUNTER — Other Ambulatory Visit: Payer: Self-pay | Admitting: Neurology

## 2023-03-14 ENCOUNTER — Other Ambulatory Visit: Payer: Self-pay | Admitting: Cardiology

## 2023-03-19 ENCOUNTER — Encounter: Payer: Self-pay | Admitting: Internal Medicine

## 2023-03-20 ENCOUNTER — Ambulatory Visit: Payer: Medicare Other | Admitting: Internal Medicine

## 2023-03-20 ENCOUNTER — Other Ambulatory Visit: Payer: Self-pay | Admitting: Internal Medicine

## 2023-03-20 DIAGNOSIS — K5904 Chronic idiopathic constipation: Secondary | ICD-10-CM

## 2023-03-20 MED ORDER — LUBIPROSTONE 24 MCG PO CAPS: 24.0000 ug | ORAL_CAPSULE | Freq: Every day | ORAL | 2 refills | Status: DC

## 2023-03-21 ENCOUNTER — Encounter (INDEPENDENT_AMBULATORY_CARE_PROVIDER_SITE_OTHER): Payer: Medicare Other | Admitting: Ophthalmology

## 2023-03-21 ENCOUNTER — Other Ambulatory Visit: Payer: Self-pay | Admitting: Cardiology

## 2023-03-21 DIAGNOSIS — H43813 Vitreous degeneration, bilateral: Secondary | ICD-10-CM | POA: Diagnosis not present

## 2023-03-21 DIAGNOSIS — H35033 Hypertensive retinopathy, bilateral: Secondary | ICD-10-CM

## 2023-03-21 DIAGNOSIS — I10 Essential (primary) hypertension: Secondary | ICD-10-CM

## 2023-03-21 DIAGNOSIS — H34831 Tributary (branch) retinal vein occlusion, right eye, with macular edema: Secondary | ICD-10-CM

## 2023-03-27 DIAGNOSIS — I5032 Chronic diastolic (congestive) heart failure: Secondary | ICD-10-CM | POA: Diagnosis not present

## 2023-03-27 DIAGNOSIS — I10 Essential (primary) hypertension: Secondary | ICD-10-CM | POA: Diagnosis not present

## 2023-03-27 DIAGNOSIS — I129 Hypertensive chronic kidney disease with stage 1 through stage 4 chronic kidney disease, or unspecified chronic kidney disease: Secondary | ICD-10-CM | POA: Diagnosis not present

## 2023-03-27 DIAGNOSIS — E870 Hyperosmolality and hypernatremia: Secondary | ICD-10-CM | POA: Diagnosis not present

## 2023-03-27 DIAGNOSIS — E782 Mixed hyperlipidemia: Secondary | ICD-10-CM | POA: Diagnosis not present

## 2023-03-27 DIAGNOSIS — K5904 Chronic idiopathic constipation: Secondary | ICD-10-CM | POA: Diagnosis not present

## 2023-03-27 DIAGNOSIS — R7303 Prediabetes: Secondary | ICD-10-CM | POA: Diagnosis not present

## 2023-03-27 DIAGNOSIS — E1122 Type 2 diabetes mellitus with diabetic chronic kidney disease: Secondary | ICD-10-CM | POA: Diagnosis not present

## 2023-03-27 DIAGNOSIS — N1832 Chronic kidney disease, stage 3b: Secondary | ICD-10-CM | POA: Diagnosis not present

## 2023-03-28 LAB — HEMOGLOBIN A1C
Est. average glucose Bld gHb Est-mCnc: 134 mg/dL
Hgb A1c MFr Bld: 6.3 % — ABNORMAL HIGH (ref 4.8–5.6)

## 2023-03-28 LAB — LIPID PANEL
Chol/HDL Ratio: 2.4 {ratio} (ref 0.0–4.4)
Cholesterol, Total: 133 mg/dL (ref 100–199)
HDL: 55 mg/dL (ref >39)
LDL Chol Calc (NIH): 66 mg/dL (ref 0–99)
Triglycerides: 55 mg/dL (ref 0–149)
VLDL Cholesterol Cal: 12 mg/dL (ref 5–40)

## 2023-03-28 LAB — TSH+FREE T4
Free T4: 1.3 ng/dL (ref 0.82–1.77)
TSH: 0.694 u[IU]/mL (ref 0.450–4.500)

## 2023-03-29 ENCOUNTER — Other Ambulatory Visit: Payer: Self-pay | Admitting: Internal Medicine

## 2023-04-03 ENCOUNTER — Other Ambulatory Visit: Payer: Self-pay | Admitting: Internal Medicine

## 2023-04-03 DIAGNOSIS — I129 Hypertensive chronic kidney disease with stage 1 through stage 4 chronic kidney disease, or unspecified chronic kidney disease: Secondary | ICD-10-CM | POA: Diagnosis not present

## 2023-04-03 DIAGNOSIS — N1832 Chronic kidney disease, stage 3b: Secondary | ICD-10-CM | POA: Diagnosis not present

## 2023-04-03 DIAGNOSIS — E1122 Type 2 diabetes mellitus with diabetic chronic kidney disease: Secondary | ICD-10-CM | POA: Diagnosis not present

## 2023-04-03 DIAGNOSIS — I5032 Chronic diastolic (congestive) heart failure: Secondary | ICD-10-CM | POA: Diagnosis not present

## 2023-04-18 ENCOUNTER — Ambulatory Visit: Payer: Self-pay | Admitting: Internal Medicine

## 2023-04-18 NOTE — Telephone Encounter (Signed)
  Chief Complaint: L ankle, foot swelling Symptoms: swelling, discomfort Frequency: several weeks, worsening yesterday Pertinent Negatives: Patient denies SOB, CP, Redness, warmth Disposition: [] ED /[x] Urgent Care (no appt availability in office) / [] Appointment(In office/virtual)/ []  [place] [facility] [facility]/ [] Home Care/ [x] Refused Recommended Disposition /[] [place] [facility]/ []  Follow-up with PCP Additional Notes: Patient's daughter, Boby Scarlet on [place], calls reporting L ankle and foot swelling for several weeks, worsening yesterday. She states the patient has a hammer toe on that foot and feels that may be leading to discomfort. Per protocol, patient to be evaluated within 3 days. Next available with PCP 2/7, caller requesting soonest available appt with any provider in clinic. Scheduled for 04/19/23 at 1600 per caller request. Care advice reviewed, caller verbalized understanding. Alerting PCP for review.   Copied from CRM [id number]. Topic: Appointments - Appointment Scheduling >> Apr 18, 2023  8:48 AM Tonda B wrote: Patient/patient representative is calling to schedule an appointment. Refer to attachments for appointment information. Patient has swelling in left foot and ankle Reason for Disposition  MILD or MODERATE ankle swelling (e.g., can't move joint normally, can't do usual activities) (Exceptions: Itchy, localized swelling; swelling is chronic.)  Answer Assessment - Initial Assessment Questions 1. LOCATION: Which ankle is swollen? Where is the swelling?     L ankle down her toes 2. ONSET: When did the swelling start?     Several weeks, worsening yesterday 3. SWELLING: How bad is the swelling? Or, How large is it? (e.g., mild, moderate, severe; size of localized swelling)    - NONE: No joint swelling.   - LOCALIZED: Localized; small area of puffy or swollen skin (e.g., insect bite, skin irritation).   - MILD: Joint looks or feels mildly swollen or puffy.   -  MODERATE: Swollen; interferes with normal activities (e.g., work or school); decreased range of movement; may be limping.   - SEVERE: Very swollen; can't move swollen joint at all; limping a lot or unable to walk.     Moderate, states it is difficult to put on shoe. 4. PAIN: Is there any pain? If Yes, ask: How bad is it? (Scale 1-10; or mild, moderate, severe)   - NONE (0): no pain.   - MILD (1-3): doesn't interfere with normal activities.    - MODERATE (4-7): interferes with normal activities (e.g., work or school) or awakens from sleep, limping.    - SEVERE (8-10): excruciating pain, unable to do any normal activities, unable to walk.      Moderate 5. CAUSE: What do you think caused the ankle swelling?     Reports patient has hammer toe on that foot, has been bothering her and feels it is causing the swelling. 6. OTHER SYMPTOMS: Do you have any other symptoms? (e.g., fever, chest pain, difficulty breathing, calf pain)     Denies  Protocols used: Ankle Swelling-A-AH

## 2023-04-18 NOTE — Telephone Encounter (Signed)
 Please advise on best method of treatment or if office visit is needed:   Pt. Contacted office due to left ankle and leg swelling since yesterday.   Pt. Reports that her left Ankle has been swollen for over 2 weeks. Starting yesterday her left foot starting swelling  on the top of her foot.   Pt. States her hammer toe is causing discomfort and that her toe is sore.   Having trouble putting on shoe, and causing discomfort when walking.   Pt. Was advised to stay hydrated, wear compression socks and use elevation to reduce swelling.

## 2023-04-19 ENCOUNTER — Ambulatory Visit (INDEPENDENT_AMBULATORY_CARE_PROVIDER_SITE_OTHER): Payer: Medicare Other | Admitting: Internal Medicine

## 2023-04-19 ENCOUNTER — Encounter: Payer: Self-pay | Admitting: Internal Medicine

## 2023-04-19 VITALS — BP 158/84 | HR 62 | Ht 64.0 in | Wt 149.8 lb

## 2023-04-19 DIAGNOSIS — M2042 Other hammer toe(s) (acquired), left foot: Secondary | ICD-10-CM | POA: Diagnosis not present

## 2023-04-19 NOTE — Assessment & Plan Note (Signed)
 Presenting today for an acute visit in the setting of left foot pain and swelling.  Swelling was present across the left midfoot 2 days ago when the patient's daughter called to schedule this appointment, but has now resolved.  She endorses pain in the second digit of her left foot where she has a hammertoe.  She has been using a hammertoe pad and keeping her toenail trimmed.  No erythema or purulence appreciated on exam.  There is no significant tenderness to palpation. Will place referral to podiatry for ongoing management.  [name] will return to care for routine follow-up with Dr. Tobie in May.

## 2023-04-19 NOTE — Patient Instructions (Signed)
 It was a pleasure to see you today.  Thank you for giving us  the opportunity to be involved in your care.  Below is a brief recap of your visit and next steps.  We will plan to see you again in May.  Summary Podiatry referral placed today Follow up with Dr. Tobie as scheduled in May

## 2023-04-19 NOTE — Progress Notes (Signed)
   Acute Office Visit  Subjective:     Patient ID: [name], female    DOB: 01/31/1935, 89 y.o.   MRN: [id number]  Chief Complaint  Patient presents with   Foot Swelling    Left foot swelling, soreness    [name] presents today for an acute visit endorsing left foot pain and swelling.  Chronic history of lower extremity edema.  She endorses increased pain in the left foot for the last 2 weeks, which she largely attributes to a hammertoe on the second digit of the left foot.  Her daughter called to schedule appointment when she noted worsening swelling over the left midfoot earlier this week.  Swelling has since resolved, but she continues to experience pain in the second digit of the left foot.  She has been trimming her toenail and attempting to use a hammertoe pad without significant symptom improvement.  Review of Systems  Musculoskeletal:        Pain in second digit of left foot      Objective:    BP (!) 158/84 (BP Location: Left Arm, Patient Position: Sitting, Cuff Size: Normal)   Pulse 62   Ht 5' 4 (1.626 m)   Wt 149 lb 12.8 oz (67.9 kg)   SpO2 97%   BMI 25.71 kg/m   Physical Exam Musculoskeletal:     Comments: Hammertoe noted on second agent of left foot.  There is no significant swelling present over the left midfoot or ankle.  No significant tenderness to palpation over the left midfoot.  No erythema noted along the second digit of the left foot.  Skin:    General: Skin is warm and dry.       Assessment & Plan:   Problem List Items Addressed This Visit       Hammertoe of left foot   Presenting today for an acute visit in the setting of left foot pain and swelling.  Swelling was present across the left midfoot 2 days ago when the patient's daughter called to schedule this appointment, but has now resolved.  She endorses pain in the second digit of her left foot where she has a hammertoe.  She has been using a hammertoe pad and keeping her  toenail trimmed.  No erythema or purulence appreciated on exam.  There is no significant tenderness to palpation. Will place referral to podiatry for ongoing management.  [name] will return to care for routine follow-up with Dr. Tobie in May.      Return if symptoms worsen or fail to improve.  Manus FORBES Fireman, MD

## 2023-04-25 ENCOUNTER — Telehealth: Payer: Self-pay | Admitting: Cardiology

## 2023-04-25 NOTE — Telephone Encounter (Signed)
The patient has been notified of the result and verbalized understanding.  All questions (if any) were answered. Roseanne Reno, [facility] 04/25/2023 10:54 AM

## 2023-04-25 NOTE — Telephone Encounter (Signed)
-----   Message from Dina Rich sent at 04/21/2023  7:31 AM EST ----- Cholesterol looks good  Dominga Ferry MD

## 2023-04-25 NOTE — Telephone Encounter (Signed)
Daughter [name]) returned staff call regarding results.

## 2023-04-26 ENCOUNTER — Encounter (INDEPENDENT_AMBULATORY_CARE_PROVIDER_SITE_OTHER): Payer: Medicare Other | Admitting: Ophthalmology

## 2023-04-26 DIAGNOSIS — H34831 Tributary (branch) retinal vein occlusion, right eye, with macular edema: Secondary | ICD-10-CM | POA: Diagnosis not present

## 2023-04-26 DIAGNOSIS — H35033 Hypertensive retinopathy, bilateral: Secondary | ICD-10-CM

## 2023-04-26 DIAGNOSIS — I10 Essential (primary) hypertension: Secondary | ICD-10-CM

## 2023-04-26 DIAGNOSIS — H43813 Vitreous degeneration, bilateral: Secondary | ICD-10-CM

## 2023-05-03 ENCOUNTER — Ambulatory Visit: Payer: Medicare Other | Admitting: Podiatry

## 2023-05-10 ENCOUNTER — Ambulatory Visit: Payer: Medicare Other | Admitting: Podiatry

## 2023-05-10 DIAGNOSIS — M2041 Other hammer toe(s) (acquired), right foot: Secondary | ICD-10-CM | POA: Diagnosis not present

## 2023-05-10 DIAGNOSIS — M2042 Other hammer toe(s) (acquired), left foot: Secondary | ICD-10-CM

## 2023-05-10 NOTE — Progress Notes (Unsigned)
 Subjective:  Patient ID: [name], female    DOB: 1935/01/12,  MRN: [id number]  Chief Complaint  Patient presents with   Foot Pain    Left foot toe pain     89 y.o. female presents with the above complaint.  Patient presents with bilateral second digit hammertoe contracture.  She states the left side is worse than right that she wanted to get it evaluated.  She wants to discuss treatment options she does not want any kind of surgical procedure/surgery center procedures.  She is open to office procedures if needed.  Pain scale is 5 out of 10 dull aching nature  Pressure is causing more   Review of Systems: Negative except as noted in the HPI. Denies N/V/F/Ch.  Past Medical History:  Diagnosis Date   Allergy    Arthritis    Colon cancer (HCC)    colon ca dx 07/30/09   History of cardiac monitoring 07/2017   "Event monitor demonstrated sinus rhythm with isolated PACs and no arrhythmias"   History of colon cancer 06/2009   found at time of TCS 06/29/09, 1.2cm sessile cecal polyp, no adjuvent therapy needed   HTN (hypertension)    Hx of cardiovascular stress test 07/2017   "No diagnostic ST segment changes to indicate ischemia. Small, moderate intensity, reversible apical to basal inferolateral defect consistent with ischemia. This is a low risk study. Nuclear stress EF: 84%."   Hyperlipidemia    Hypothyroidism    PE (pulmonary thromboembolism) (HCC)    Renal disorder    cyst on kidney    Silent micro-hemorrhage of brain (HCC) 11/25/2018   Stroke (HCC)    TIA (transient ischemic attack) 11/25/2018   Vertigo     Current Outpatient Medications:    acetaminophen (TYLENOL) 500 MG tablet, Take 500 mg by mouth every 8 (eight) hours as needed for mild pain or headache., Disp: , Rfl:    apixaban (ELIQUIS) 5 MG TABS tablet, Take 1 tablet (5 mg total) by mouth 2 (two) times daily. Start after completing starter pack, Disp: 60 tablet, Rfl: 5   ascorbic acid (VITAMIN C) 250 MG  CHEW, Chew 250 mg by mouth daily., Disp: , Rfl:    ASPERCREME LIDOCAINE EX, Apply 1 application topically daily as needed (for knee pain). , Disp: , Rfl:    chlorthalidone (HYGROTON) 25 MG tablet, Take 12.5 mg by mouth daily. (Patient not taking: Reported on 02/13/2023), Disp: , Rfl:    cholecalciferol (VITAMIN D3) 25 MCG (1000 UT) tablet, Take 1,000 Units by mouth daily. , Disp: , Rfl:    diltiazem (CARDIZEM CD) 180 MG 24 hr capsule, Take 1 capsule (180 mg total) by mouth daily., Disp: 90 capsule, Rfl: 3   diltiazem (CARDIZEM) 30 MG tablet, Take 1 tablet (30 mg total) by mouth every 8 (eight) hours as needed (Palpitations)., Disp: 60 tablet, Rfl: 2   ezetimibe (ZETIA) 10 MG tablet, TAKE (1) TABLET BY MOUTH ONCE DAILY., Disp: 90 tablet, Rfl: 3   gabapentin (NEURONTIN) 300 MG capsule, Take 1 capsule (300 mg total) by mouth 2 (two) times daily., Disp: 60 capsule, Rfl: 4   levETIRAcetam (KEPPRA) 250 MG tablet, Take 1 tablet (250 mg total) by mouth 2 (two) times daily. (Patient taking differently: Take 250 mg by mouth 2 (two) times daily. 1/2 tablet twice daily), Disp: 180 tablet, Rfl: 3   Lifitegrast (XIIDRA) 5 % SOLN, Apply 1 drop to eye daily as needed (dry eyes)., Disp: , Rfl:    loratadine (  CLARITIN) 10 MG tablet, Take 10 mg by mouth daily. , Disp: , Rfl:    lubiprostone (AMITIZA) 24 MCG capsule, Take 1 capsule (24 mcg total) by mouth daily with breakfast., Disp: 30 capsule, Rfl: 2   meclizine (ANTIVERT) 25 MG tablet, Take 1 tablet (25 mg total) by mouth 2 (two) times daily as needed for dizziness., Disp: 30 tablet, Rfl: 0   metoprolol succinate (TOPROL-XL) 100 MG 24 hr tablet, TAKE 1 TABLET BY MOUTH IN THE MORNING AND AT BEDTIME. TAKE WITH OR IMMEDIATELY FOLLOWING A MEAL., Disp: 180 tablet, Rfl: 1   pantoprazole (PROTONIX) 40 MG tablet, 1T1 BY MOUTH EVERY DAY, Disp: 30 tablet, Rfl: 1   rosuvastatin (CRESTOR) 40 MG tablet, TAKE ONE TABLET BY MOUTH ONCE DAILY., Disp: 90 tablet, Rfl: 2   topiramate  (TOPAMAX) 50 MG tablet, Take 1 tablet (50 mg total) by mouth 2 (two) times daily., Disp: 90 tablet, Rfl: 1   traMADol (ULTRAM) 50 MG tablet, Take 0.5-1 tablets (25-50 mg total) by mouth every 12 (twelve) hours as needed for severe pain., Disp: 15 tablet, Rfl: 0   UNABLE TO FIND, Flutter value- acapella device- use as instructed, Disp: 1 each, Rfl: 0   UNABLE TO FIND, 1 each by Does not apply route daily. Med Name: Blood pressure cuff, Disp: 1 each, Rfl: 0  Social History   Tobacco Use  Smoking Status Never  Smokeless Tobacco Never    Allergies  Allergen Reactions   Tape Rash    Zio monitor adhesive causes ulcerated and infected skin .Had to see derm   Objective:  There were no vitals filed for this visit. There is no height or weight on file to calculate BMI. Constitutional Well developed. Well nourished.  Vascular Dorsalis pedis pulses palpable bilaterally. Posterior tibial pulses palpable bilaterally. Capillary refill normal to all digits.  No cyanosis or clubbing noted. Pedal hair growth normal.  Neurologic Normal speech. Oriented to person, place, and time. Epicritic sensation to light touch grossly present bilaterally.  Dermatologic Nails well groomed and normal in appearance. No open wounds. No skin lesions.  Orthopedic: Bilateral hammertoe contracture noted semiflexible in nature pain on palpation to the hammertoe contracture.  No other bony abnormalities identified   Radiographs: None Assessment:   1. Hammertoe of second toe of left foot   2. Hammertoe of second toe of right foot    Plan:  Patient was evaluated and treated and all questions answered.  Bilateral second digit hammertoe contracture semiflexible -All questions and concerns were discussed with the patient extensive detail I discussed with the patient that in the future if it continues to bother her she would benefit from flexor tenotomy of bilateral second digit to straighten out the hammertoe she  states understanding will think about the procedure -Shoe gear modification discussed  No follow-ups on file.

## 2023-05-13 ENCOUNTER — Other Ambulatory Visit: Payer: Self-pay | Admitting: Neurology

## 2023-05-14 DIAGNOSIS — H5712 Ocular pain, left eye: Secondary | ICD-10-CM | POA: Diagnosis not present

## 2023-05-14 DIAGNOSIS — R030 Elevated blood-pressure reading, without diagnosis of hypertension: Secondary | ICD-10-CM | POA: Diagnosis not present

## 2023-05-26 ENCOUNTER — Other Ambulatory Visit: Payer: Self-pay | Admitting: Internal Medicine

## 2023-06-07 ENCOUNTER — Encounter (INDEPENDENT_AMBULATORY_CARE_PROVIDER_SITE_OTHER): Payer: Medicare Other | Admitting: Ophthalmology

## 2023-06-07 DIAGNOSIS — H2513 Age-related nuclear cataract, bilateral: Secondary | ICD-10-CM

## 2023-06-07 DIAGNOSIS — I10 Essential (primary) hypertension: Secondary | ICD-10-CM

## 2023-06-07 DIAGNOSIS — H34831 Tributary (branch) retinal vein occlusion, right eye, with macular edema: Secondary | ICD-10-CM

## 2023-06-07 DIAGNOSIS — H35033 Hypertensive retinopathy, bilateral: Secondary | ICD-10-CM

## 2023-06-07 DIAGNOSIS — H43813 Vitreous degeneration, bilateral: Secondary | ICD-10-CM | POA: Diagnosis not present

## 2023-06-08 ENCOUNTER — Other Ambulatory Visit: Payer: Self-pay | Admitting: Cardiology

## 2023-06-16 ENCOUNTER — Other Ambulatory Visit: Payer: Self-pay

## 2023-06-16 DIAGNOSIS — I2699 Other pulmonary embolism without acute cor pulmonale: Secondary | ICD-10-CM

## 2023-06-16 DIAGNOSIS — C188 Malignant neoplasm of overlapping sites of colon: Secondary | ICD-10-CM

## 2023-06-19 ENCOUNTER — Inpatient Hospital Stay: Payer: Medicare Other | Attending: Hematology

## 2023-06-19 ENCOUNTER — Other Ambulatory Visit: Payer: Medicare Other

## 2023-06-19 DIAGNOSIS — Z85038 Personal history of other malignant neoplasm of large intestine: Secondary | ICD-10-CM | POA: Diagnosis not present

## 2023-06-19 DIAGNOSIS — Z7901 Long term (current) use of anticoagulants: Secondary | ICD-10-CM | POA: Diagnosis not present

## 2023-06-19 DIAGNOSIS — Z86711 Personal history of pulmonary embolism: Secondary | ICD-10-CM | POA: Diagnosis not present

## 2023-06-19 DIAGNOSIS — C188 Malignant neoplasm of overlapping sites of colon: Secondary | ICD-10-CM

## 2023-06-19 DIAGNOSIS — Z800 Family history of malignant neoplasm of digestive organs: Secondary | ICD-10-CM | POA: Insufficient documentation

## 2023-06-19 DIAGNOSIS — N1832 Chronic kidney disease, stage 3b: Secondary | ICD-10-CM | POA: Insufficient documentation

## 2023-06-19 DIAGNOSIS — I2699 Other pulmonary embolism without acute cor pulmonale: Secondary | ICD-10-CM

## 2023-06-19 DIAGNOSIS — Z79899 Other long term (current) drug therapy: Secondary | ICD-10-CM | POA: Insufficient documentation

## 2023-06-19 DIAGNOSIS — R911 Solitary pulmonary nodule: Secondary | ICD-10-CM | POA: Diagnosis not present

## 2023-06-19 DIAGNOSIS — Z86718 Personal history of other venous thrombosis and embolism: Secondary | ICD-10-CM | POA: Insufficient documentation

## 2023-06-19 LAB — CBC WITH DIFFERENTIAL/PLATELET
Abs Immature Granulocytes: 0.02 10*3/uL (ref 0.00–0.07)
Basophils Absolute: 0 10*3/uL (ref 0.0–0.1)
Basophils Relative: 1 %
Eosinophils Absolute: 0.3 10*3/uL (ref 0.0–0.5)
Eosinophils Relative: 6 %
HCT: 39.6 % (ref 36.0–46.0)
Hemoglobin: 12.6 g/dL (ref 12.0–15.0)
Immature Granulocytes: 1 %
Lymphocytes Relative: 36 %
Lymphs Abs: 1.6 10*3/uL (ref 0.7–4.0)
MCH: 32.7 pg (ref 26.0–34.0)
MCHC: 31.8 g/dL (ref 30.0–36.0)
MCV: 102.9 fL — ABNORMAL HIGH (ref 80.0–100.0)
Monocytes Absolute: 0.4 10*3/uL (ref 0.1–1.0)
Monocytes Relative: 9 %
Neutro Abs: 2.1 10*3/uL (ref 1.7–7.7)
Neutrophils Relative %: 47 %
Platelets: 217 10*3/uL (ref 150–400)
RBC: 3.85 MIL/uL — ABNORMAL LOW (ref 3.87–5.11)
RDW: 14.3 % (ref 11.5–15.5)
WBC: 4.4 10*3/uL (ref 4.0–10.5)
nRBC: 0 % (ref 0.0–0.2)

## 2023-06-19 LAB — COMPREHENSIVE METABOLIC PANEL WITH GFR
ALT: 38 U/L (ref 0–44)
AST: 29 U/L (ref 15–41)
Albumin: 3.7 g/dL (ref 3.5–5.0)
Alkaline Phosphatase: 79 U/L (ref 38–126)
Anion gap: 9 (ref 5–15)
BUN: 21 mg/dL (ref 8–23)
CO2: 24 mmol/L (ref 22–32)
Calcium: 9.1 mg/dL (ref 8.9–10.3)
Chloride: 108 mmol/L (ref 98–111)
Creatinine, Ser: 1.76 mg/dL — ABNORMAL HIGH (ref 0.44–1.00)
GFR, Estimated: 28 mL/min — ABNORMAL LOW (ref >60)
Glucose, Bld: 110 mg/dL — ABNORMAL HIGH (ref 70–99)
Potassium: 3.5 mmol/L (ref 3.5–5.1)
Sodium: 141 mmol/L (ref 135–145)
Total Bilirubin: 0.6 mg/dL (ref 0.0–1.2)
Total Protein: 7.3 g/dL (ref 6.5–8.1)

## 2023-06-19 LAB — D-DIMER, QUANTITATIVE: D-Dimer, Quant: 0.3 ug{FEU}/mL (ref 0.00–0.50)

## 2023-06-22 ENCOUNTER — Ambulatory Visit (INDEPENDENT_AMBULATORY_CARE_PROVIDER_SITE_OTHER): Admitting: Internal Medicine

## 2023-06-22 ENCOUNTER — Encounter: Payer: Self-pay | Admitting: Internal Medicine

## 2023-06-22 VITALS — BP 138/72 | HR 63 | Ht 61.0 in | Wt 146.8 lb

## 2023-06-22 DIAGNOSIS — M7989 Other specified soft tissue disorders: Secondary | ICD-10-CM | POA: Diagnosis not present

## 2023-06-22 DIAGNOSIS — R42 Dizziness and giddiness: Secondary | ICD-10-CM

## 2023-06-22 DIAGNOSIS — J309 Allergic rhinitis, unspecified: Secondary | ICD-10-CM | POA: Insufficient documentation

## 2023-06-22 MED ORDER — MECLIZINE HCL 25 MG PO TABS: 25.0000 mg | ORAL_TABLET | Freq: Two times a day (BID) | ORAL | 0 refills | Status: AC | PRN

## 2023-06-22 MED ORDER — FLUTICASONE PROPIONATE 50 MCG/ACT NA SUSP: 2.0000 | Freq: Every day | NASAL | 1 refills | Status: AC

## 2023-06-22 NOTE — Assessment & Plan Note (Signed)
 Chronic nasal congestion, postnasal drip, dry cough and ear symptoms are likely due to allergic sinusitis Continue Claritin Added Flonase for nasal congestion/allergies Can use vaporizer for nasal congestion

## 2023-06-22 NOTE — Assessment & Plan Note (Signed)
Her dizziness is likely due to vertigo Prescribed meclizine 25 mg as needed for dizziness Avoid sudden positional changes Maintain adequate hydration She used to get Epley maneuvers at ENT according to chart review -if persistent dizziness, may need ENT evaluation

## 2023-06-22 NOTE — Progress Notes (Addendum)
 Acute Office Visit  Subjective:    Patient ID: [name], female    DOB: 06-22-1934, 89 y.o.   MRN: [id number]  Chief Complaint  Patient presents with   Dizziness    Pt reports sx of dizziness and feeling unsteady and also some swelling on and off on her feet and legs.     HPI Patient is in today for complaint of dizziness for the last 2 weeks.  She has felt off balance due to it.  She had dizziness upon sitting as well, especially while looking upwards.  She feels room spinning sensation with head movement at times, which is due to vertigo.  Her BP is overall well controlled for her age currently.  Denies any ear pain or discharge currently.  She has history of excessive earwax, had ear irrigation in 11/24 with ENT specialist.  She has tried taking OTC meclizine with mild relief. She also reports nasal congestion, sinus pressure related headache and sore throat for the last 1 week.  She also reports intermittent leg swelling, but reports that she had stopped taking chlorthalidone, which was prescribed by Dr. Wolfgang Phoenix.  Past Medical History:  Diagnosis Date   Allergy    Arthritis    Colon cancer (HCC)    colon ca dx 07/30/09   History of cardiac monitoring 07/2017   "Event monitor demonstrated sinus rhythm with isolated PACs and no arrhythmias"   History of colon cancer 06/2009   found at time of TCS 06/29/09, 1.2cm sessile cecal polyp, no adjuvent therapy needed   HTN (hypertension)    Hx of cardiovascular stress test 07/2017   "No diagnostic ST segment changes to indicate ischemia. Small, moderate intensity, reversible apical to basal inferolateral defect consistent with ischemia. This is a low risk study. Nuclear stress EF: 84%."   Hyperlipidemia    Hypothyroidism    PE (pulmonary thromboembolism) (HCC)    Renal disorder    cyst on kidney    Silent micro-hemorrhage of brain (HCC) 11/25/2018   Stroke [facility])    TIA (transient ischemic attack) 11/25/2018   Vertigo      Past Surgical History:  Procedure Laterality Date   ABDOMINAL HYSTERECTOMY     COLON SURGERY  07/2009   right hemicolectomy, no residual colon cancer on path   COLONOSCOPY  07/16/2010   [id number] POLYP-TCS 3 YEARS   COLONOSCOPY N/A 08/02/2013   hyperplastic polyps, surveillance in 2020 if benefits outweight the risks   COLONOSCOPY  06/2009   1.2 cm sessile cecal polyp which had adenocarcinoma arising in a tubular adenoma.   PARTIAL THYMECTOMY     partial thyroidectomy     benign tumors    Family History  Problem Relation Age of Onset   Colon cancer Mother        >age75   Arthritis Mother    Cancer Mother    Heart disease Mother    Hyperlipidemia Mother    Hypertension Mother    Heart attack Father    Heart disease Father    Diabetes Maternal Aunt    Hyperlipidemia Daughter    Hypertension Daughter    Liver disease Neg Hx     Social History   Socioeconomic History   Marital status: Widowed    Spouse name: Not on file   Number of children: 2   Years of education: Not on file   Highest education level: Not on file  Occupational History   Occupation: Agricultural consultant at [employer]    Employer:  RETIRED   Occupation: retired from Designer, fashion/clothing  Tobacco Use   Smoking status: Never   Smokeless tobacco: Never  Vaping Use   Vaping status: Never Used  Substance and Sexual Activity   Alcohol use: No    Alcohol/week: 0.0 standard drinks of alcohol   Drug use: No   Sexual activity: Not Currently  Other Topics Concern   Not on file  Social History Narrative   Not on file   Social Drivers of Health   Financial Resource Strain: Low Risk  (11/30/2022)   Overall Financial Resource Strain (CARDIA)    Difficulty of Paying Living Expenses: Not hard at all  Food Insecurity: No Food Insecurity (11/30/2022)   Hunger Vital Sign    Worried About Running Out of Food in the Last Year: Never true    Ran Out of Food in the Last Year: Never true  Transportation Needs: No Transportation  Needs (11/30/2022)   PRAPARE - Administrator, Civil Service (Medical): No    Lack of Transportation (Non-Medical): No  Physical Activity: Insufficiently Active (11/30/2022)   Exercise Vital Sign    Days of Exercise per Week: 4 days    Minutes of Exercise per Session: 10 min  Stress: No Stress Concern Present (11/30/2022)   [employer] of Occupational Health - Occupational Stress Questionnaire    Feeling of Stress : Only a little  Social Connections: Moderately Isolated (11/30/2022)   Social Connection and Isolation Panel [NHANES]    Frequency of Communication with Friends and Family: More than three times a week    Frequency of Social Gatherings with Friends and Family: More than three times a week    Attends Religious Services: More than 4 times per year    Active Member of [employer] or Organizations: No    Attends Banker Meetings: Never    Marital Status: Widowed  Intimate Partner Violence: Not At Risk (11/30/2022)   Humiliation, Afraid, Rape, and Kick questionnaire    Fear of Current or Ex-Partner: No    Emotionally Abused: No    Physically Abused: No    Sexually Abused: No    Outpatient Medications Prior to Visit  Medication Sig Dispense Refill   acetaminophen (TYLENOL) 500 MG tablet Take 500 mg by mouth every 8 (eight) hours as needed for mild pain or headache.     apixaban (ELIQUIS) 5 MG TABS tablet Take 1 tablet (5 mg total) by mouth 2 (two) times daily. Start after completing starter pack 60 tablet 5   ascorbic acid (VITAMIN C) 250 MG CHEW Chew 250 mg by mouth daily.     ASPERCREME LIDOCAINE EX Apply 1 application topically daily as needed (for knee pain).      chlorthalidone (HYGROTON) 25 MG tablet Take 12.5 mg by mouth daily.     cholecalciferol (VITAMIN D3) 25 MCG (1000 UT) tablet Take 1,000 Units by mouth daily.      diltiazem (CARDIZEM CD) 180 MG 24 hr capsule Take 1 capsule (180 mg total) by mouth daily. 90 capsule 3   diltiazem (CARDIZEM) 30  MG tablet Take 1 tablet (30 mg total) by mouth every 8 (eight) hours as needed (Palpitations). 60 tablet 2   ezetimibe (ZETIA) 10 MG tablet TAKE (1) TABLET BY MOUTH ONCE DAILY. 90 tablet 3   gabapentin (NEURONTIN) 300 MG capsule Take 1 capsule (300 mg total) by mouth 2 (two) times daily. 60 capsule 4   levETIRAcetam (KEPPRA) 250 MG tablet Take 1 tablet (250 mg  total) by mouth 2 (two) times daily. (Patient taking differently: Take 250 mg by mouth 2 (two) times daily. 1/2 tablet twice daily) 180 tablet 3   Lifitegrast (XIIDRA) 5 % SOLN Apply 1 drop to eye daily as needed (dry eyes).     loratadine (CLARITIN) 10 MG tablet Take 10 mg by mouth daily.      lubiprostone (AMITIZA) 24 MCG capsule Take 1 capsule (24 mcg total) by mouth daily with breakfast. 30 capsule 2   metoprolol succinate (TOPROL-XL) 100 MG 24 hr tablet TAKE 1 TABLET BY MOUTH IN THE MORNING AND AT BEDTIME. TAKE WITH OR IMMEDIATELY FOLLOWING A MEAL. 180 tablet 1   pantoprazole (PROTONIX) 40 MG tablet TAKE ONE TABLET BY MOUTH EVERY DAY 30 tablet 1   rosuvastatin (CRESTOR) 40 MG tablet TAKE ONE TABLET BY MOUTH ONCE DAILY. 90 tablet 2   topiramate (TOPAMAX) 50 MG tablet TAKE ONE TABLET BY MOUTH TWICE DAILY 90 tablet 6   traMADol (ULTRAM) 50 MG tablet Take 0.5-1 tablets (25-50 mg total) by mouth every 12 (twelve) hours as needed for severe pain. 15 tablet 0   UNABLE TO FIND Flutter value- acapella device- use as instructed 1 each 0   UNABLE TO FIND 1 each by Does not apply route daily. Med Name: Blood pressure cuff 1 each 0   meclizine (ANTIVERT) 25 MG tablet Take 1 tablet (25 mg total) by mouth 2 (two) times daily as needed for dizziness. 30 tablet 0   No facility-administered medications prior to visit.    Allergies  Allergen Reactions   Tape Rash    Zio monitor adhesive causes ulcerated and infected skin .Had to see derm    Review of Systems  Constitutional:  Negative for chills and fever.  HENT:  Positive for congestion, sinus  pressure and sore throat.   Eyes:  Negative for pain and discharge.  Respiratory:  Positive for cough. Negative for shortness of breath.   Cardiovascular:  Positive for leg swelling. Negative for chest pain and palpitations.  Gastrointestinal:  Negative for abdominal pain, constipation, diarrhea, nausea and vomiting.  Endocrine: Negative for polydipsia and polyuria.  Genitourinary:  Negative for dysuria and hematuria.  Musculoskeletal:  Positive for arthralgias (L knee). Negative for neck pain and neck stiffness.  Skin:  Negative for rash.  Neurological:  Positive for dizziness. Negative for weakness.  Psychiatric/Behavioral:  Negative for agitation and behavioral problems.        Objective:    Physical Exam Vitals reviewed.  Constitutional:      General: She is not in acute distress.    Appearance: She is not diaphoretic.  HENT:     Right Ear: Ear canal normal. There is impacted cerumen.     Left Ear: Ear canal normal. There is impacted cerumen.     Nose: Congestion present.  Eyes:     General: No scleral icterus.    Extraocular Movements: Extraocular movements intact.  Cardiovascular:     Rate and Rhythm: Normal rate and regular rhythm.     Heart sounds: Normal heart sounds. No murmur heard. Pulmonary:     Breath sounds: Normal breath sounds. No wheezing or rales.  Musculoskeletal:     Cervical back: Neck supple. No tenderness.     Left knee: Swelling present. Decreased range of motion. Tenderness present over the medial joint line.     Right lower leg: Edema (Mild) present.     Left lower leg: Edema (Mild) present.  Skin:    General:  Skin is warm.     Findings: No rash.  Neurological:     General: No focal deficit present.     Mental Status: She is alert and oriented to person, place, and time.     Sensory: Sensory deficit (Left UE) present.     Motor: Weakness (Left UE and LE - muscle strength 4/5) present.  Psychiatric:        Mood and Affect: Mood normal.         Behavior: Behavior normal.     BP 138/72 (BP Location: Left Arm)   Pulse 63   Ht 5\' 1"  (1.549 m)   Wt 146 lb 12.8 oz (66.6 kg)   SpO2 94%   BMI 27.74 kg/m  Wt Readings from Last 3 Encounters:  06/22/23 146 lb 12.8 oz (66.6 kg)  04/19/23 149 lb 12.8 oz (67.9 kg)  02/13/23 152 lb (68.9 kg)        Assessment & Plan:   Problem List Items Addressed This Visit       Respiratory   Allergic sinusitis - Primary   Chronic nasal congestion, postnasal drip, dry cough and ear symptoms are likely due to allergic sinusitis Continue Claritin Added Flonase for nasal congestion/allergies Can use vaporizer for nasal congestion       Relevant Medications   fluticasone (FLONASE) 50 MCG/ACT nasal spray     Other   Vertigo   Her dizziness is likely due to vertigo Prescribed meclizine 25 mg as needed for dizziness Avoid sudden positional changes Maintain adequate hydration She used to get Epley maneuvers at ENT according to chart review -if persistent dizziness, may need ENT evaluation      Relevant Medications   meclizine (ANTIVERT) 25 MG tablet   Leg swelling   Usually gets better with leg elevation and compression stockings Needs to restart Chlorthalidone 12.5 mg QD         Meds ordered this encounter  Medications   fluticasone (FLONASE) 50 MCG/ACT nasal spray    Sig: Place 2 sprays into both nostrils daily.    Dispense:  16 g    Refill:  1   meclizine (ANTIVERT) 25 MG tablet    Sig: Take 1 tablet (25 mg total) by mouth 2 (two) times daily as needed for dizziness.    Dispense:  30 tablet    Refill:  0     Emaan Gary Concha Se, MD

## 2023-06-22 NOTE — Patient Instructions (Addendum)
 Please use Flonase for nasal congestion.  Please perform warm water gargling for sore throat. Please use vaporizer for nasal congestion.  Please take Meclizine as needed for dizziness.  Please avoid sudden head movements.

## 2023-06-22 NOTE — Assessment & Plan Note (Signed)
 Usually gets better with leg elevation and compression stockings Needs to restart Chlorthalidone 12.5 mg QD

## 2023-06-23 NOTE — Progress Notes (Signed)
 [facility] [address], [place] [address]   CLINIC:  Medical Oncology/Hematology  PCP:  Meldon Sport, MD [address] [place] [address] [phone]   REASON FOR VISIT:  Follow-up for recurrent unprovoked pulmonary embolism, on chronic anticoagulation  CURRENT THERAPY: Eliquis  INTERVAL HISTORY:   [name] 89 y.o. female returns for routine follow-up of recurrent unprovoked pulmonary embolism, on chronic Eliquis.  She was last seen by Dr. Cheree Cords on 12/19/2022.  At today's visit, she reports feeling fairly well.  She is accompanied today by her daughter, [name]  No recent hospitalizations, surgeries, or changes in baseline health status.  RECURRENT PULMONARY EMBOLISM:  She remains on Eliquis 5 mg twice daily.  She has some chronic cough and shortness of breath with exertion, but denies any unilateral leg swelling / pain / erythema, chest pain, cough, or hemoptysis.  She does have some intermittent palpitations.  She denies any history of major bleeding episodes, and has not noticed any recent epistaxis, melena, hematochezia, or hematuria.  She denies any falls within the past year.  HISTORY OF COLON CANCER: She denies any abdominal pain, blood in stool, or changes in bowel habits.    LUNG NODULE: She denies any hemoptysis, weight changes, new onset cough, shortness of breath, or chest pain.  She reports good appetite, but has lost 7 pounds in the past 4 months.  She has 60% energy and 100% appetite.  ASSESSMENT & PLAN:  1.   Recurrent unprovoked DVT and pulmonary embolism: - CT PE protocol on 04/26/2018 showed bilateral pulmonary emboli. - She was treated with Eliquis until her hospitalization on 11/25/2018 when she was diagnosed with a 4 mm frontal hemorrhage. - Eliquis was discontinued due to cerebral hemorrhage.  She started taking aspirin 325 mg in December 2020. - She had multiple lower extremity Dopplers which were negative for  DVT. - Doppler on 09/30/2022: Subacute chronic nonocclusive DVT in the right profundofemoral vein. - CT angiogram on 10/02/2022: Positive for acute bilateral PE with CT evidence of right heart strain consistent with at least submassive PE. - Resumed Eliquis 5 mg twice daily following second PE (July 2024) - She denies any bleeding episodes.  Falls within the past year. - Most recent labs (06/19/2023) show normal CBC.  Baseline CKD stage IIIb with creatinine 1.76/GFR 28.  Normal D-dimer 0.30. - PLAN: Based on age >49 and creatinine >1.5, recommend decreased dose of Eliquis at 2.5 mg twice daily.  Rx sent to pharmacy. - Continue annual follow-up with CBC, BMP, and D-dimer and assessment of risk/benefit ratio of anticoagulation. - Patient is aware of alarm symptoms of recurrent VTE, and of symptoms concerning for bleeding.  She knows to seek medical attention for any falls with head strike.  2.   Colon cancer: - Cecal polypectomy on 06/29/2009 with adenocarcinoma arising in a tubular adenoma. - Right colon segmental resection with 0/25 lymph nodes positive and no residual cancer on 07/31/2009. - Last colonoscopy on 08/02/2013 showed hyperplastic polyp in the rectum and sigmoid colon. - CT of the abdomen and pelvis on 05/28/2018 did not show any evidence of malignancy. - CT abdomen/pelvis (10/02/2022 during ED/inpatient sepsis workup) showed no evidence of recurrent intra-abdominal or pelvic malignancy. -She denies any significant weight loss, abdominal pain, nausea, vomiting, changes in bowel movements, or blood in bowel movements  3.  LUNG NODULE (incidental finding per CT scan from 10/02/2022) - CT chest (10/02/2022 during ED visit) showed INCIDENTAL FINDING of new  right solid pulmonary nodule within right upper lobe, measuring 14 mm - Follow-up CT in 3 months was recommended by radiology, but no outpatient follow-up was established  - PLAN: Noncontrast CT chest for follow-up of right upper lobe lung nodule  from 10/02/2022   PLAN SUMMARY: >> CT chest (ASAP) for lung nodule **Will watch for results and call patient / daughter with additional plan.** >> Rx to pharmacy for Eliquis 2.5 mg twice daily >> Labs in 1 year = CBC, BMP, D-dimer >> OFFICE visit in 1 year (1 week after labs)     REVIEW OF SYSTEMS:   Review of Systems  Constitutional:  Positive for fatigue (mild). Negative for appetite change, chills, diaphoresis, fever and unexpected weight change.  HENT:   Negative for lump/mass and nosebleeds.   Eyes:  Negative for eye problems.  Respiratory:  Positive for cough and shortness of breath (with exertion). Negative for hemoptysis.   Cardiovascular:  Positive for palpitations. Negative for chest pain and leg swelling.  Gastrointestinal:  Negative for abdominal pain, blood in stool, constipation, diarrhea, nausea and vomiting.  Genitourinary:  Negative for hematuria.   Skin: Negative.   Neurological:  Positive for dizziness. Negative for headaches and light-headedness.  Hematological:  Does not bruise/bleed easily.     PHYSICAL EXAM:  ECOG PERFORMANCE STATUS: 2 - Symptomatic, <50% confined to bed  Vitals:   06/26/23 1355  BP: 136/82  Pulse: 61  Resp: 18  Temp: 97.9 F (36.6 C)  SpO2: 96%   Filed Weights   06/26/23 1355  Weight: 145 lb (65.8 kg)   Physical Exam Constitutional:      Appearance: Normal appearance. She is normal weight.  Cardiovascular:     Heart sounds: Normal heart sounds.  Pulmonary:     Breath sounds: Normal breath sounds.  Neurological:     General: No focal deficit present.     Mental Status: Mental status is at baseline.  Psychiatric:        Behavior: Behavior normal. Behavior is cooperative.     PAST MEDICAL/SURGICAL HISTORY:  Past Medical History:  Diagnosis Date   Allergy    Arthritis    Colon cancer (HCC)    colon ca dx 07/30/09   History of cardiac monitoring 07/2017   "Event monitor demonstrated sinus rhythm with isolated PACs and  no arrhythmias"   History of colon cancer 06/2009   found at time of TCS 06/29/09, 1.2cm sessile cecal polyp, no adjuvent therapy needed   HTN (hypertension)    Hx of cardiovascular stress test 07/2017   "No diagnostic ST segment changes to indicate ischemia. Small, moderate intensity, reversible apical to basal inferolateral defect consistent with ischemia. This is a low risk study. Nuclear stress EF: 84%."   Hyperlipidemia    Hypothyroidism    PE (pulmonary thromboembolism) (HCC)    Renal disorder    cyst on kidney    Silent micro-hemorrhage of brain (HCC) 11/25/2018   Stroke [facility])    TIA (transient ischemic attack) 11/25/2018   Vertigo    Past Surgical History:  Procedure Laterality Date   ABDOMINAL HYSTERECTOMY     COLON SURGERY  07/2009   right hemicolectomy, no residual colon cancer on path   COLONOSCOPY  07/16/2010   [id number]:[id number] POLYP-TCS 3 YEARS   COLONOSCOPY N/A 08/02/2013   hyperplastic polyps, surveillance in 2020 if benefits outweight the risks   COLONOSCOPY  06/2009   1.2 cm sessile cecal polyp which had adenocarcinoma arising in a tubular adenoma.  PARTIAL THYMECTOMY     partial thyroidectomy     benign tumors    SOCIAL HISTORY:  Social History   Socioeconomic History   Marital status: Widowed    Spouse name: Not on file   Number of children: 2   Years of education: Not on file   Highest education level: Not on file  Occupational History   Occupation: Agricultural consultant at [employer]    Employer: RETIRED   Occupation: retired from Designer, fashion/clothing  Tobacco Use   Smoking status: Never   Smokeless tobacco: Never  Vaping Use   Vaping status: Never Used  Substance and Sexual Activity   Alcohol use: No    Alcohol/week: 0.0 standard drinks of alcohol   Drug use: No   Sexual activity: Not Currently  Other Topics Concern   Not on file  Social History Narrative   Not on file   Social Drivers of Health   Financial Resource Strain: Low Risk  (11/30/2022)   Overall Financial  Resource Strain (CARDIA)    Difficulty of Paying Living Expenses: Not hard at all  Food Insecurity: No Food Insecurity (11/30/2022)   Hunger Vital Sign    Worried About Running Out of Food in the Last Year: Never true    Ran Out of Food in the Last Year: Never true  Transportation Needs: No Transportation Needs (11/30/2022)   PRAPARE - Administrator, Civil Service (Medical): No    Lack of Transportation (Non-Medical): No  Physical Activity: Insufficiently Active (11/30/2022)   Exercise Vital Sign    Days of Exercise per Week: 4 days    Minutes of Exercise per Session: 10 min  Stress: No Stress Concern Present (11/30/2022)   [employer] of Occupational Health - Occupational Stress Questionnaire    Feeling of Stress : Only a little  Social Connections: Moderately Isolated (11/30/2022)   Social Connection and Isolation Panel [NHANES]    Frequency of Communication with Friends and Family: More than three times a week    Frequency of Social Gatherings with Friends and Family: More than three times a week    Attends Religious Services: More than 4 times per year    Active Member of [employer] or Organizations: No    Attends Banker Meetings: Never    Marital Status: Widowed  Intimate Partner Violence: Not At Risk (11/30/2022)   Humiliation, Afraid, Rape, and Kick questionnaire    Fear of Current or Ex-Partner: No    Emotionally Abused: No    Physically Abused: No    Sexually Abused: No    FAMILY HISTORY:  Family History  Problem Relation Age of Onset   Colon cancer Mother        >[id number]   Arthritis Mother    Cancer Mother    Heart disease Mother    Hyperlipidemia Mother    Hypertension Mother    Heart attack Father    Heart disease Father    Diabetes Maternal Aunt    Hyperlipidemia Daughter    Hypertension Daughter    Liver disease Neg Hx     CURRENT MEDICATIONS:  Outpatient Encounter Medications as of 06/26/2023  Medication Sig   acetaminophen  (TYLENOL) 500 MG tablet Take 500 mg by mouth every 8 (eight) hours as needed for mild pain or headache.   apixaban (ELIQUIS) 2.5 MG TABS tablet Take 1 tablet (2.5 mg total) by mouth 2 (two) times daily.   ascorbic acid (VITAMIN C) 250 MG CHEW Chew 250 mg by  mouth daily.   ASPERCREME LIDOCAINE EX Apply 1 application topically daily as needed (for knee pain).    chlorthalidone (HYGROTON) 25 MG tablet Take 12.5 mg by mouth daily.   cholecalciferol (VITAMIN D3) 25 MCG (1000 UT) tablet Take 1,000 Units by mouth daily.    diltiazem (CARDIZEM CD) 180 MG 24 hr capsule Take 1 capsule (180 mg total) by mouth daily.   diltiazem (CARDIZEM) 30 MG tablet Take 1 tablet (30 mg total) by mouth every 8 (eight) hours as needed (Palpitations).   ezetimibe (ZETIA) 10 MG tablet TAKE (1) TABLET BY MOUTH ONCE DAILY.   fluticasone (FLONASE) 50 MCG/ACT nasal spray Place 2 sprays into both nostrils daily.   gabapentin (NEURONTIN) 300 MG capsule Take 1 capsule (300 mg total) by mouth 2 (two) times daily.   levETIRAcetam (KEPPRA) 250 MG tablet Take 1 tablet (250 mg total) by mouth 2 (two) times daily. (Patient taking differently: Take 250 mg by mouth 2 (two) times daily. 1/2 tablet twice daily)   loratadine (CLARITIN) 10 MG tablet Take 10 mg by mouth daily.    lubiprostone (AMITIZA) 24 MCG capsule Take 1 capsule (24 mcg total) by mouth daily with breakfast.   meclizine (ANTIVERT) 25 MG tablet Take 1 tablet (25 mg total) by mouth 2 (two) times daily as needed for dizziness.   metoprolol succinate (TOPROL-XL) 100 MG 24 hr tablet TAKE 1 TABLET BY MOUTH IN THE MORNING AND AT BEDTIME. TAKE WITH OR IMMEDIATELY FOLLOWING A MEAL.   pantoprazole (PROTONIX) 40 MG tablet TAKE ONE TABLET BY MOUTH EVERY DAY   RESTASIS 0.05 % ophthalmic emulsion 1 drop 2 (two) times daily.   rosuvastatin (CRESTOR) 40 MG tablet TAKE ONE TABLET BY MOUTH ONCE DAILY.   topiramate (TOPAMAX) 50 MG tablet TAKE ONE TABLET BY MOUTH TWICE DAILY   traMADol (ULTRAM)  50 MG tablet Take 0.5-1 tablets (25-50 mg total) by mouth every 12 (twelve) hours as needed for severe pain.   UNABLE TO FIND Flutter value- acapella device- use as instructed   UNABLE TO FIND 1 each by Does not apply route daily. Med Name: Blood pressure cuff   [DISCONTINUED] apixaban (ELIQUIS) 5 MG TABS tablet Take 1 tablet (5 mg total) by mouth 2 (two) times daily. Start after completing starter pack   [DISCONTINUED] Lifitegrast (XIIDRA) 5 % SOLN Apply 1 drop to eye daily as needed (dry eyes).   No facility-administered encounter medications on file as of 06/26/2023.    ALLERGIES:  Allergies  Allergen Reactions   Tape Rash    Zio monitor adhesive causes ulcerated and infected skin .Had to see derm    LABORATORY DATA:  I have reviewed the labs as listed.  CBC    Component Value Date/Time   WBC 4.4 06/19/2023 1456   RBC 3.85 (L) 06/19/2023 1456   HGB 12.6 06/19/2023 1456   HGB 12.0 10/17/2022 1444   HGB 14.7 04/16/2015 0812   HCT 39.6 06/19/2023 1456   HCT 38.1 10/17/2022 1444   HCT 44.2 04/16/2015 0812   PLT 217 06/19/2023 1456   PLT 294 10/17/2022 1444   MCV 102.9 (H) 06/19/2023 1456   MCV 99 (H) 10/17/2022 1444   MCV 93.8 04/16/2015 0812   MCH 32.7 06/19/2023 1456   MCHC 31.8 06/19/2023 1456   RDW 14.3 06/19/2023 1456   RDW 14.2 10/17/2022 1444   RDW 15.0 (H) 04/16/2015 0812   LYMPHSABS 1.6 06/19/2023 1456   LYMPHSABS 1.6 02/07/2022 1023   LYMPHSABS 1.9 04/16/2015 1610  MONOABS 0.4 06/19/2023 1456   MONOABS 0.4 04/16/2015 0812   EOSABS 0.3 06/19/2023 1456   EOSABS 0.2 02/07/2022 1023   BASOSABS 0.0 06/19/2023 1456   BASOSABS 0.1 02/07/2022 1023   BASOSABS 0.1 04/16/2015 0812      Latest Ref Rng & Units 06/19/2023    2:56 PM 12/12/2022    2:49 PM 10/17/2022    2:44 PM  CMP  Glucose 70 - 99 mg/dL 657  846  71   BUN 8 - 23 mg/dL 21  20  23    Creatinine 0.44 - 1.00 mg/dL 9.62  9.52  8.41   Sodium 135 - 145 mmol/L 141  141  146   Potassium 3.5 - 5.1 mmol/L 3.5   3.3  4.1   Chloride 98 - 111 mmol/L 108  109  109   CO2 22 - 32 mmol/L 24  24  26    Calcium 8.9 - 10.3 mg/dL 9.1  9.1  8.6   Total Protein 6.5 - 8.1 g/dL 7.3  7.0    Total Bilirubin 0.0 - 1.2 mg/dL 0.6  0.5    Alkaline Phos 38 - 126 U/L 79  78    AST 15 - 41 U/L 29  40    ALT 0 - 44 U/L 38  50      DIAGNOSTIC IMAGING:  I have independently reviewed the relevant imaging and discussed with the patient.   WRAP UP:  All questions were answered. The patient knows to call the clinic with any problems, questions or concerns.  Medical decision making: Moderate  Time spent on visit: I spent 20 minutes counseling the patient face to face. The total time spent in the appointment was 30 minutes and more than 50% was on counseling.  Sonnie Dusky, PA-C  06/26/23 3:44 PM

## 2023-06-26 ENCOUNTER — Inpatient Hospital Stay: Payer: Medicare Other | Admitting: Physician Assistant

## 2023-06-26 ENCOUNTER — Ambulatory Visit: Payer: Medicare Other | Admitting: Physician Assistant

## 2023-06-26 VITALS — BP 136/82 | HR 61 | Temp 97.9°F | Resp 18 | Ht 61.0 in | Wt 145.0 lb

## 2023-06-26 DIAGNOSIS — I2699 Other pulmonary embolism without acute cor pulmonale: Secondary | ICD-10-CM

## 2023-06-26 DIAGNOSIS — Z79899 Other long term (current) drug therapy: Secondary | ICD-10-CM | POA: Diagnosis not present

## 2023-06-26 DIAGNOSIS — Z800 Family history of malignant neoplasm of digestive organs: Secondary | ICD-10-CM | POA: Diagnosis not present

## 2023-06-26 DIAGNOSIS — Z86718 Personal history of other venous thrombosis and embolism: Secondary | ICD-10-CM | POA: Diagnosis not present

## 2023-06-26 DIAGNOSIS — R911 Solitary pulmonary nodule: Secondary | ICD-10-CM | POA: Diagnosis not present

## 2023-06-26 DIAGNOSIS — Z7901 Long term (current) use of anticoagulants: Secondary | ICD-10-CM | POA: Diagnosis not present

## 2023-06-26 DIAGNOSIS — Z86711 Personal history of pulmonary embolism: Secondary | ICD-10-CM | POA: Diagnosis not present

## 2023-06-26 DIAGNOSIS — N1832 Chronic kidney disease, stage 3b: Secondary | ICD-10-CM | POA: Diagnosis not present

## 2023-06-26 DIAGNOSIS — Z85038 Personal history of other malignant neoplasm of large intestine: Secondary | ICD-10-CM | POA: Diagnosis not present

## 2023-06-26 MED ORDER — APIXABAN 2.5 MG PO TABS: 2.5000 mg | ORAL_TABLET | Freq: Two times a day (BID) | ORAL | 11 refills | Status: AC

## 2023-06-26 NOTE — Patient Instructions (Signed)
 [facility] at [facility] **VISIT SUMMARY & IMPORTANT INSTRUCTIONS **   You were seen today by Rojelio Brenner PA-C for your follow-up visit.    PULMONARY EMBOLISM (blood clot in your lung) Since both of your blood clots were unprovoked, you are at high risk of having another blood clot in the future. You will need to stay on blood thinners indefinitely. However, due to your age and decreased kidney function, we will decrease the dose of your Eliquis to take 2.5 mg twice daily (instead of 5 mg twice daily). Seek IMMEDIATE medical attention if any of the following occur: Evidence of bleeding such as nosebleeds, bright red blood in the toilet, black tarry bowel movements, or bleeding from any source that does not resolve quickly. Evidence of another blood clot such as swelling in one leg more than the other, chest pain, difficulty breathing, or coughing up blood. Any fall that resulted in you hitting your head. We will see you for follow-up in 1 year.  However, please let us know sooner if you have any new blood clots, increased frequency of falls, or bleeding episodes.  LUNG NODULE Your CT scan from July 2024 showed a nodule ("spot in your lung") in your right upper lung.  This could be due to a benign cause (infection or inflammation), but could also be evidence of lung cancer. We will schedule you for a CT chest as soon as possible and we will call you with these results and next steps once they are available.  FOLLOW-UP APPOINTMENT: 1 year  ** Thank you for trusting me with your healthcare!  I strive to provide all of my patients with quality care at each visit.  If you receive a survey for this visit, I would be so grateful to you for taking the time to provide feedback.  Thank you in advance!  ~ Sidharth Leverette                   Dr. Doreatha Massed   &   Rojelio Brenner, PA-C   - - - - - - - - - - - - - - - - - -    Thank you for choosing [facility] at [facility] to provide your oncology and hematology care.  To afford each patient quality time with our provider, please arrive at least 15 minutes before your scheduled appointment time.   If you have a lab appointment with the Cancer Center please come in thru the Main Entrance and check in at the main information desk.  You need to re-schedule your appointment should you arrive 10 or more minutes late.  We strive to give you quality time with our providers, and arriving late affects you and other patients whose appointments are after yours.  Also, if you no show three or more times for appointments you may be dismissed from the clinic at the providers discretion.     Again, thank you for choosing [facility].  Our hope is that these requests will decrease the amount of time that you wait before being seen by our physicians.       _____________________________________________________________  Should you have questions after your visit to [facility], please contact our office at [phone] and follow the prompts.  Our office hours are 8:00 a.m. and 4:30 p.m. Monday - Friday.  Please note that voicemails left after 4:00 p.m. may not be returned until the  following business day.  We are closed weekends and major holidays.  You do have access to a nurse 24-7, just call the main number to the clinic [phone] and do not press any options, hold on the line and a nurse will answer the phone.    For prescription refill requests, have your pharmacy contact our office and allow 72 hours.

## 2023-06-28 ENCOUNTER — Ambulatory Visit (HOSPITAL_COMMUNITY)
Admission: RE | Admit: 2023-06-28 | Discharge: 2023-06-28 | Disposition: A | Discharge status: Home / Self Care | Source: Ambulatory Visit | Attending: Physician Assistant | Admitting: Physician Assistant

## 2023-06-28 DIAGNOSIS — R911 Solitary pulmonary nodule: Secondary | ICD-10-CM | POA: Insufficient documentation

## 2023-06-28 DIAGNOSIS — R918 Other nonspecific abnormal finding of lung field: Secondary | ICD-10-CM | POA: Diagnosis not present

## 2023-06-28 DIAGNOSIS — E041 Nontoxic single thyroid nodule: Secondary | ICD-10-CM | POA: Diagnosis not present

## 2023-07-04 ENCOUNTER — Telehealth: Payer: Self-pay | Admitting: Physician Assistant

## 2023-07-04 ENCOUNTER — Other Ambulatory Visit: Payer: Self-pay | Admitting: Cardiology

## 2023-07-04 DIAGNOSIS — R911 Solitary pulmonary nodule: Secondary | ICD-10-CM

## 2023-07-04 NOTE — Telephone Encounter (Signed)
 I have reviewed radiology report from CT chest performed on 06/28/2023 -  Right upper lobe subpleural pulmonary nodule has decreased in size, and now resembles a thin-walled cyst with associated adjacent cluster of pulmonary micronodules measuring up to 4 mm.  There is no new pulmonary nodule. Vague patchy groundglass airspace opacities within bilateral upper lobes, suggestive of infection/inflammation.  Consider follow-up CT in 3 months to evaluate for resolution. Incidental left thyroid  nodule with heterogeneous and enlarged thyroid  measuring 2.9 cm.  Recommend nonemergent thyroid  ultrasound if clinically warranted given patient age. Aortic atherosclerosis, including coronary artery calcification.  Findings are less concerning for malignancy, as nodule has decreased in size. We will order follow-up CT in 3 months. Results forwarded to PCP so that they may follow-up on other findings.  Will continue to reach out to patient's daughter to relay the results/plan.  (Patient requested that daughter be called, rather than calling patient herself.)  Sonnie Dusky, PA-C 07/04/23 10:07 AM

## 2023-07-08 ENCOUNTER — Encounter: Payer: Self-pay | Admitting: Internal Medicine

## 2023-07-08 ENCOUNTER — Other Ambulatory Visit: Payer: Self-pay | Admitting: Internal Medicine

## 2023-07-08 DIAGNOSIS — G609 Hereditary and idiopathic neuropathy, unspecified: Secondary | ICD-10-CM

## 2023-07-24 ENCOUNTER — Ambulatory Visit (INDEPENDENT_AMBULATORY_CARE_PROVIDER_SITE_OTHER): Payer: Medicare Other | Admitting: Internal Medicine

## 2023-07-24 ENCOUNTER — Encounter: Payer: Self-pay | Admitting: Internal Medicine

## 2023-07-24 VITALS — BP 135/69 | HR 61 | Ht 61.0 in | Wt 149.6 lb

## 2023-07-24 DIAGNOSIS — I10 Essential (primary) hypertension: Secondary | ICD-10-CM

## 2023-07-24 DIAGNOSIS — G40209 Localization-related (focal) (partial) symptomatic epilepsy and epileptic syndromes with complex partial seizures, not intractable, without status epilepticus: Secondary | ICD-10-CM

## 2023-07-24 DIAGNOSIS — R42 Dizziness and giddiness: Secondary | ICD-10-CM | POA: Diagnosis not present

## 2023-07-24 DIAGNOSIS — K5904 Chronic idiopathic constipation: Secondary | ICD-10-CM

## 2023-07-24 DIAGNOSIS — E039 Hypothyroidism, unspecified: Secondary | ICD-10-CM

## 2023-07-24 DIAGNOSIS — G609 Hereditary and idiopathic neuropathy, unspecified: Secondary | ICD-10-CM

## 2023-07-24 DIAGNOSIS — N1832 Chronic kidney disease, stage 3b: Secondary | ICD-10-CM | POA: Diagnosis not present

## 2023-07-24 DIAGNOSIS — E041 Nontoxic single thyroid nodule: Secondary | ICD-10-CM | POA: Insufficient documentation

## 2023-07-24 DIAGNOSIS — I2699 Other pulmonary embolism without acute cor pulmonale: Secondary | ICD-10-CM

## 2023-07-24 NOTE — Assessment & Plan Note (Signed)
 Now on Eliquis  2.5 mg BID - since 10/06/22 Had right heart strain and elevated BP - BP wnl today, appears euvolemic Had hematology evaluation-recommend indefinite AC with Eliquis 

## 2023-07-24 NOTE — Progress Notes (Unsigned)
 Established Patient Office Visit  Subjective:  Patient ID: [name], female    DOB: 08/17/1934  Age: 89 y.o. MRN: [id number]  CC:  Chief Complaint  Patient presents with   Hypertension    Six month follow up    Chronic Kidney Disease    Six month follow up     HPI [name] is a 89 y.o. female with past medical history of HTN, prediabetes, CVA with residual left sided numbness and weakness, ?partial seizure, hypothyroidism, colon ca. s/p right hemicolectomy, chronic constipation, PE and CKD stage 3 who presents for f/u of her chronic medical conditions.  HTN: BP is well-controlled. Takes medications regularly. Patient denies headache, chest pain, dyspnea or palpitations.  She has dizziness upon sudden change in position, especially while standing up. She reports dizziness while sitting as well and sways towards the side due to it. She has tried Meclizine  with mild relief. She has had allergic sinusitis, which also caused ear fullness, but has improved now since the last visit.  She had hematology evaluation for PE.  She has been advised to take Eliquis  indefinitely due to recurrent PE.   Her leg swelling has improved with leg elevation and compression stockings.  She still has mild ankle swelling bilaterally, but is better with Chlorthalidone 12.5 mg now.  She follows up with Dr Carrolyn Clan for CKD stage 3. Denies dysuria, hematuria or urinary hesitancy or resistance.   She takes levothyroxine  for hypothyroidism.  She denies any recent change in weight or appetite.  She follows up with neurology for history of CVA and seizures.  She currently takes Keppra  125 mg twice daily and topiramate  50 mg twice daily.  She has history of peripheral neuropathy, had glovelike sensation in her UE, and has been taking gabapentin  300 mg 2 times daily.   Past Medical History:  Diagnosis Date   Allergy    Arthritis    Colon cancer (HCC)    colon ca dx 07/30/09   History of  cardiac monitoring 07/2017   "Event monitor demonstrated sinus rhythm with isolated PACs and no arrhythmias"   History of colon cancer 06/2009   found at time of TCS 06/29/09, 1.2cm sessile cecal polyp, no adjuvent therapy needed   HTN (hypertension)    Hx of cardiovascular stress test 07/2017   "No diagnostic ST segment changes to indicate ischemia. Small, moderate intensity, reversible apical to basal inferolateral defect consistent with ischemia. This is a low risk study. Nuclear stress EF: 84%."   Hyperlipidemia    Hypothyroidism    PE (pulmonary thromboembolism) (HCC)    Renal disorder    cyst on kidney    Silent micro-hemorrhage of brain (HCC) 11/25/2018   Stroke [facility])    TIA (transient ischemic attack) 11/25/2018   Vertigo     Past Surgical History:  Procedure Laterality Date   ABDOMINAL HYSTERECTOMY     COLON SURGERY  07/2009   right hemicolectomy, no residual colon cancer on path   COLONOSCOPY  07/16/2010   WUJ:[id number] POLYP-TCS 3 YEARS   COLONOSCOPY N/A 08/02/2013   hyperplastic polyps, surveillance in 2020 if benefits outweight the risks   COLONOSCOPY  06/2009   1.2 cm sessile cecal polyp which had adenocarcinoma arising in a tubular adenoma.   PARTIAL THYMECTOMY     partial thyroidectomy     benign tumors    Family History  Problem Relation Age of Onset   Colon cancer Mother        >age60  Arthritis Mother    Cancer Mother    Heart disease Mother    Hyperlipidemia Mother    Hypertension Mother    Heart attack Father    Heart disease Father    Diabetes Maternal Aunt    Hyperlipidemia Daughter    Hypertension Daughter    Liver disease Neg Hx     Social History   Socioeconomic History   Marital status: Widowed    Spouse name: Not on file   Number of children: 2   Years of education: Not on file   Highest education level: Not on file  Occupational History   Occupation: Agricultural consultant at [employer]    Employer: RETIRED   Occupation: retired from Designer, fashion/clothing   Tobacco Use   Smoking status: Never   Smokeless tobacco: Never  Vaping Use   Vaping status: Never Used  Substance and Sexual Activity   Alcohol use: No    Alcohol/week: 0.0 standard drinks of alcohol   Drug use: No   Sexual activity: Not Currently  Other Topics Concern   Not on file  Social History Narrative   Not on file   Social Drivers of Health   Financial Resource Strain: Low Risk  (11/30/2022)   Overall Financial Resource Strain (CARDIA)    Difficulty of Paying Living Expenses: Not hard at all  Food Insecurity: No Food Insecurity (11/30/2022)   Hunger Vital Sign    Worried About Running Out of Food in the Last Year: Never true    Ran Out of Food in the Last Year: Never true  Transportation Needs: No Transportation Needs (11/30/2022)   PRAPARE - Administrator, Civil Service (Medical): No    Lack of Transportation (Non-Medical): No  Physical Activity: Insufficiently Active (11/30/2022)   Exercise Vital Sign    Days of Exercise per Week: 4 days    Minutes of Exercise per Session: 10 min  Stress: No Stress Concern Present (11/30/2022)   [employer] of Occupational Health - Occupational Stress Questionnaire    Feeling of Stress : Only a little  Social Connections: Moderately Isolated (11/30/2022)   Social Connection and Isolation Panel [NHANES]    Frequency of Communication with Friends and Family: More than three times a week    Frequency of Social Gatherings with Friends and Family: More than three times a week    Attends Religious Services: More than 4 times per year    Active Member of [employer] or Organizations: No    Attends Banker Meetings: Never    Marital Status: Widowed  Intimate Partner Violence: Not At Risk (11/30/2022)   Humiliation, Afraid, Rape, and Kick questionnaire    Fear of Current or Ex-Partner: No    Emotionally Abused: No    Physically Abused: No    Sexually Abused: No    Outpatient Medications Prior to Visit   Medication Sig Dispense Refill   acetaminophen  (TYLENOL ) 500 MG tablet Take 500 mg by mouth every 8 (eight) hours as needed for mild pain or headache.     apixaban  (ELIQUIS ) 2.5 MG TABS tablet Take 1 tablet (2.5 mg total) by mouth 2 (two) times daily. 60 tablet 11   ascorbic acid  (VITAMIN C ) 250 MG CHEW Chew 250 mg by mouth daily.     ASPERCREME LIDOCAINE  EX Apply 1 application topically daily as needed (for knee pain).      chlorthalidone (HYGROTON) 25 MG tablet Take 12.5 mg by mouth daily.     cholecalciferol  (VITAMIN D3) 25  MCG (1000 UT) tablet Take 1,000 Units by mouth daily.      diltiazem  (CARDIZEM  CD) 180 MG 24 hr capsule Take 1 capsule (180 mg total) by mouth daily. 90 capsule 3   diltiazem  (CARDIZEM ) 30 MG tablet Take 1 tablet (30 mg total) by mouth every 8 (eight) hours as needed (Palpitations). 90 tablet 2   ezetimibe  (ZETIA ) 10 MG tablet TAKE (1) TABLET BY MOUTH ONCE DAILY. 90 tablet 3   fluticasone  (FLONASE ) 50 MCG/ACT nasal spray Place 2 sprays into both nostrils daily. 16 g 1   gabapentin  (NEURONTIN ) 300 MG capsule TAKE ONE CAPSULE BY MOUTH TWICE DAILY 60 capsule 4   levETIRAcetam  (KEPPRA ) 250 MG tablet Take 1 tablet (250 mg total) by mouth 2 (two) times daily. (Patient taking differently: Take 250 mg by mouth 2 (two) times daily. 1/2 tablet twice daily) 180 tablet 3   loratadine (CLARITIN) 10 MG tablet Take 10 mg by mouth daily.      lubiprostone  (AMITIZA ) 24 MCG capsule Take 1 capsule (24 mcg total) by mouth daily with breakfast. 30 capsule 2   meclizine  (ANTIVERT ) 25 MG tablet Take 1 tablet (25 mg total) by mouth 2 (two) times daily as needed for dizziness. 30 tablet 0   metoprolol  succinate (TOPROL -XL) 100 MG 24 hr tablet TAKE 1 TABLET BY MOUTH IN THE MORNING AND AT BEDTIME. TAKE WITH OR IMMEDIATELY FOLLOWING A MEAL. 180 tablet 1   pantoprazole  (PROTONIX ) 40 MG tablet TAKE ONE TABLET BY MOUTH EVERY DAY 30 tablet 1   RESTASIS  0.05 % ophthalmic emulsion 1 drop 2 (two) times  daily.     rosuvastatin  (CRESTOR ) 40 MG tablet TAKE ONE TABLET BY MOUTH ONCE DAILY. 90 tablet 2   topiramate  (TOPAMAX ) 50 MG tablet TAKE ONE TABLET BY MOUTH TWICE DAILY 90 tablet 6   traMADol  (ULTRAM ) 50 MG tablet Take 0.5-1 tablets (25-50 mg total) by mouth every 12 (twelve) hours as needed for severe pain. 15 tablet 0   UNABLE TO FIND Flutter value- acapella device- use as instructed 1 each 0   UNABLE TO FIND 1 each by Does not apply route daily. Med Name: Blood pressure cuff 1 each 0   No facility-administered medications prior to visit.    Allergies  Allergen Reactions   Tape Rash    Zio monitor adhesive causes ulcerated and infected skin .Had to see derm    ROS Review of Systems  Constitutional:  Negative for chills and fever.  HENT:  Negative for congestion, sinus pressure, sinus pain and sore throat.   Eyes:  Negative for pain and discharge.  Respiratory:  Negative for cough and shortness of breath.   Cardiovascular:  Positive for leg swelling. Negative for chest pain and palpitations.  Gastrointestinal:  Positive for constipation. Negative for abdominal pain, diarrhea, nausea and vomiting.  Endocrine: Negative for polydipsia and polyuria.  Genitourinary:  Negative for dysuria and hematuria.  Musculoskeletal:  Positive for arthralgias (L knee). Negative for neck pain and neck stiffness.  Skin:  Negative for rash.  Neurological:  Positive for dizziness. Negative for weakness.  Psychiatric/Behavioral:  Negative for agitation and behavioral problems.       Objective:    Physical Exam Vitals reviewed.  Constitutional:      General: She is not in acute distress.    Appearance: She is not diaphoretic.  HENT:     Right Ear: Ear canal normal.     Left Ear: Ear canal normal.     Mouth/Throat:  Mouth: Mucous membranes are moist.     Pharynx: No posterior oropharyngeal erythema.  Eyes:     General: No scleral icterus.    Extraocular Movements: Extraocular movements  intact.  Cardiovascular:     Rate and Rhythm: Normal rate and regular rhythm.     Heart sounds: Normal heart sounds. No murmur heard. Pulmonary:     Breath sounds: Normal breath sounds. No wheezing or rales.  Abdominal:     Palpations: Abdomen is soft.     Tenderness: There is no abdominal tenderness.  Musculoskeletal:     Cervical back: Neck supple. No tenderness.     Left knee: Swelling present. Decreased range of motion. Tenderness present over the medial joint line.     Right lower leg: No edema.     Left lower leg: No edema.  Skin:    General: Skin is warm.     Findings: No rash.  Neurological:     General: No focal deficit present.     Mental Status: She is alert and oriented to person, place, and time.     Sensory: Sensory deficit (Left UE) present.     Motor: Weakness (Left UE and LE - muscle strength 4/5) present.  Psychiatric:        Mood and Affect: Mood normal.        Behavior: Behavior normal.     BP 135/69   Pulse 61   Ht 5\' 1"  (1.549 m)   Wt 149 lb 9.6 oz (67.9 kg)   SpO2 98%   BMI 28.27 kg/m  Wt Readings from Last 3 Encounters:  07/24/23 149 lb 9.6 oz (67.9 kg)  06/26/23 145 lb (65.8 kg)  06/22/23 146 lb 12.8 oz (66.6 kg)    Lab Results  Component Value Date   TSH 0.694 03/27/2023   Lab Results  Component Value Date   WBC 4.4 06/19/2023   HGB 12.6 06/19/2023   HCT 39.6 06/19/2023   MCV 102.9 (H) 06/19/2023   PLT 217 06/19/2023   Lab Results  Component Value Date   NA 141 06/19/2023   K 3.5 06/19/2023   CHLORIDE 105 04/16/2015   CO2 24 06/19/2023   GLUCOSE 110 (H) 06/19/2023   BUN 21 06/19/2023   CREATININE 1.76 (H) 06/19/2023   BILITOT 0.6 06/19/2023   ALKPHOS 79 06/19/2023   AST 29 06/19/2023   ALT 38 06/19/2023   PROT 7.3 06/19/2023   ALBUMIN 3.7 06/19/2023   CALCIUM  9.1 06/19/2023   ANIONGAP 9 06/19/2023   EGFR 26 (L) 10/17/2022   Lab Results  Component Value Date   CHOL 133 03/27/2023   Lab Results  Component Value Date    HDL 55 03/27/2023   Lab Results  Component Value Date   LDLCALC 66 03/27/2023   Lab Results  Component Value Date   TRIG 55 03/27/2023   Lab Results  Component Value Date   CHOLHDL 2.4 03/27/2023   Lab Results  Component Value Date   HGBA1C 6.3 (H) 03/27/2023      Assessment & Plan:   Problem List Items Addressed This Visit       Cardiovascular and Mediastinum   Essential hypertension - Primary   BP Readings from Last 1 Encounters:  07/24/23 135/69   Well-controlled with Metoprolol  100 mg BID and Cardizem  180 mg once daily On Chlorthalidone 12.5 mg QD Counseled for compliance with the medications Advised DASH diet and moderate exercise/walking as tolerated      Pulmonary embolism (HCC)  Now on Eliquis  2.5 mg BID - since 10/06/22 Had right heart strain and elevated BP - BP wnl today, appears euvolemic Had hematology evaluation-recommend indefinite AC with Eliquis         Endocrine   Hypothyroidism   Lab Results  Component Value Date   TSH 0.694 03/27/2023   DCed Levothyroxine  25 mcg in the previous visit as TSH was low, later normalized without Levothyroxine  No change in appetite, weight, tremors or palpitations Check TSH and free T4      Thyroid  nodule greater than or equal to 1 cm in diameter incidentally noted on imaging study   Noted on CT chest Check US  thyroid       Relevant Orders   US  THYROID      Nervous and Auditory   Idiopathic peripheral neuropathy   Continue Gabapentin  300 mg BID - refilled        Genitourinary   Chronic kidney disease, stage 3b (HCC)   Last CMP reviewed, CKD stage 3b, stable GFR in 30s F/u with Nephrology Avoid nephrotoxic agents No proteinuria currently according to Nephrology chart        Other   Constipation   Usually well-controlled with Amitiza  - has had loose BM at times, advised to take every other day, she prefers to take it PRN Can take Dulcolax or Colace as needed for now Maintain adequate  hydration      Vertigo   Her dizziness is likely due to vertigo Has meclizine  25 mg as needed for dizziness Avoid sudden positional changes Maintain adequate hydration Advised to perform simple vestibular exercises at home, material shown She used to get Epley maneuvers at ENT according to chart review -if persistent dizziness, may need ENT evaluation      Complex partial seizure (HCC)   Denies any recent seizure-like activity Followed by [facility] neurology On Keppra  and Topamax         No orders of the defined types were placed in this encounter.   Follow-up: Return in about 6 months (around 01/24/2024) for Annual physical (after 01/24/24).    Meldon Sport, MD

## 2023-07-24 NOTE — Assessment & Plan Note (Addendum)
 BP Readings from Last 1 Encounters:  07/24/23 135/69   Well-controlled with Metoprolol  100 mg BID and Cardizem  180 mg once daily On Chlorthalidone 12.5 mg QD Counseled for compliance with the medications Advised DASH diet and moderate exercise/walking as tolerated

## 2023-07-24 NOTE — Assessment & Plan Note (Signed)
 Noted on CT chest Check US  thyroid 

## 2023-07-24 NOTE — Assessment & Plan Note (Signed)
 Lab Results  Component Value Date   TSH 0.694 03/27/2023   DCed Levothyroxine  25 mcg in the previous visit as TSH was low, later normalized without Levothyroxine  No change in appetite, weight, tremors or palpitations Check TSH and free T4

## 2023-07-24 NOTE — Assessment & Plan Note (Signed)
Denies any recent seizure-like activity Followed by [facility] neurology On Keppra and Topamax

## 2023-07-24 NOTE — Assessment & Plan Note (Signed)
 Usually well-controlled with Amitiza  Can take Dulcolax or Colace as needed for now Maintain adequate hydration

## 2023-07-24 NOTE — Assessment & Plan Note (Signed)
Last CMP reviewed, CKD stage 3b, stable GFR in 30s F/u with Nephrology Avoid nephrotoxic agents No proteinuria currently according to Nephrology chart 

## 2023-07-24 NOTE — Assessment & Plan Note (Signed)
 Continue Gabapentin 300 mg BID - refilled

## 2023-07-24 NOTE — Patient Instructions (Addendum)
 Please continue to take medications as prescribed.  Please continue to follow low salt diet and ambulate as tolerated.  Please perform simple head exercises as shown in this video: Inner Ear Balance Home Exercises to Treat Dizziness (Vestibular Home Exercises)

## 2023-07-25 NOTE — Assessment & Plan Note (Signed)
 Her dizziness is likely due to vertigo Has meclizine  25 mg as needed for dizziness Avoid sudden positional changes Maintain adequate hydration Advised to perform simple vestibular exercises at home, material shown She used to get Epley maneuvers at ENT according to chart review -if persistent dizziness, may need ENT evaluation

## 2023-07-26 ENCOUNTER — Encounter (INDEPENDENT_AMBULATORY_CARE_PROVIDER_SITE_OTHER): Admitting: Ophthalmology

## 2023-07-26 DIAGNOSIS — H35033 Hypertensive retinopathy, bilateral: Secondary | ICD-10-CM | POA: Diagnosis not present

## 2023-07-26 DIAGNOSIS — H34831 Tributary (branch) retinal vein occlusion, right eye, with macular edema: Secondary | ICD-10-CM | POA: Diagnosis not present

## 2023-07-26 DIAGNOSIS — I10 Essential (primary) hypertension: Secondary | ICD-10-CM | POA: Diagnosis not present

## 2023-07-26 DIAGNOSIS — H43813 Vitreous degeneration, bilateral: Secondary | ICD-10-CM

## 2023-07-28 ENCOUNTER — Other Ambulatory Visit: Payer: Self-pay | Admitting: Internal Medicine

## 2023-08-02 DIAGNOSIS — D7589 Other specified diseases of blood and blood-forming organs: Secondary | ICD-10-CM | POA: Diagnosis not present

## 2023-08-02 DIAGNOSIS — D539 Nutritional anemia, unspecified: Secondary | ICD-10-CM | POA: Diagnosis not present

## 2023-08-02 DIAGNOSIS — I10 Essential (primary) hypertension: Secondary | ICD-10-CM | POA: Diagnosis not present

## 2023-08-02 DIAGNOSIS — E119 Type 2 diabetes mellitus without complications: Secondary | ICD-10-CM | POA: Diagnosis not present

## 2023-08-02 DIAGNOSIS — N189 Chronic kidney disease, unspecified: Secondary | ICD-10-CM | POA: Diagnosis not present

## 2023-08-07 DIAGNOSIS — I129 Hypertensive chronic kidney disease with stage 1 through stage 4 chronic kidney disease, or unspecified chronic kidney disease: Secondary | ICD-10-CM | POA: Diagnosis not present

## 2023-08-07 DIAGNOSIS — N184 Chronic kidney disease, stage 4 (severe): Secondary | ICD-10-CM | POA: Diagnosis not present

## 2023-08-07 DIAGNOSIS — E1122 Type 2 diabetes mellitus with diabetic chronic kidney disease: Secondary | ICD-10-CM | POA: Diagnosis not present

## 2023-08-07 DIAGNOSIS — I5032 Chronic diastolic (congestive) heart failure: Secondary | ICD-10-CM | POA: Diagnosis not present

## 2023-08-14 ENCOUNTER — Encounter: Payer: Self-pay | Admitting: Cardiology

## 2023-08-16 ENCOUNTER — Telehealth: Payer: Self-pay | Admitting: Internal Medicine

## 2023-08-16 NOTE — Telephone Encounter (Signed)
 Copied from CRM [id number]. Topic: General - Other >> Aug 16, 2023  3:18 PM Carlatta H wrote: Reason for CRM: Please call [facility] [phone] to reschedule ultrasound//She is patient transportation and unable to make the day and time scheduled currently

## 2023-08-21 ENCOUNTER — Ambulatory Visit (HOSPITAL_COMMUNITY)
Admission: RE | Admit: 2023-08-21 | Discharge: 2023-08-21 | Disposition: A | Discharge status: Home / Self Care | Source: Ambulatory Visit | Attending: Internal Medicine | Admitting: Internal Medicine

## 2023-08-21 ENCOUNTER — Encounter: Payer: Self-pay | Admitting: Cardiology

## 2023-08-21 ENCOUNTER — Ambulatory Visit: Payer: Medicare Other | Admitting: Cardiology

## 2023-08-21 VITALS — BP 118/68 | HR 63 | Ht 63.0 in | Wt 149.6 lb

## 2023-08-21 DIAGNOSIS — I10 Essential (primary) hypertension: Secondary | ICD-10-CM | POA: Insufficient documentation

## 2023-08-21 DIAGNOSIS — R002 Palpitations: Secondary | ICD-10-CM | POA: Diagnosis not present

## 2023-08-21 DIAGNOSIS — E782 Mixed hyperlipidemia: Secondary | ICD-10-CM | POA: Diagnosis not present

## 2023-08-21 DIAGNOSIS — E041 Nontoxic single thyroid nodule: Secondary | ICD-10-CM | POA: Insufficient documentation

## 2023-08-21 NOTE — Progress Notes (Signed)
 Clinical Summary [name] is a 89 y.o.female seen today for follow up of the following medical problems.    1. Palpitations - prior monitor showed PACs, PVCs, short runs of SVT longest 11 seconds  -severe allergy to monitor adhesive     -no recent palpitations - compliant with meds.      2. HTN - compliant with meds   3. Unprovoked Pulmonary embolus 04/26/18 -  followed by intracranial hemorrhage on eliquis  which was stopped  -admit 09/2022 recurrent PE - was started on anticoag per hospital notes, notes mention neurology consulted but I do not see there note.  - managed by hematology as outpatient - no bleeding on eliquis .   4. Hyperlipidemia - 09/2019 TC 130 HDL 46 TG 107 LDL 65 - 12/2020 TC 122 TG 43 HDL 52 LDL 61 - 07/2021 TC 161 TG 28 HDL 62 LDL 76 - 01/2022 TC 157 TG 85 HDL 64 LDL 77 - Jan 2025 TC 133 TG 55 HDL 55 LDL 66 - she is on crestor  40mg  daily. Last visit we added zetia  10mg  daily.      5. History of seizures - 12/2020 admitted with AMS, thought to be postictal from seizure   6. TIA - changed from ASA to plavix  07/2021 - changed from plavix  to eliquis  after 09/2022 admit with PE  7. CKD - followed by nephrology, followed by Dr Carrolyn Clan.   Past Medical History:  Diagnosis Date   Allergy    Arthritis    Colon cancer (HCC)    colon ca dx 07/30/09   History of cardiac monitoring 07/2017   "Event monitor demonstrated sinus rhythm with isolated PACs and no arrhythmias"   History of colon cancer 06/2009   found at time of TCS 06/29/09, 1.2cm sessile cecal polyp, no adjuvent therapy needed   HTN (hypertension)    Hx of cardiovascular stress test 07/2017   "No diagnostic ST segment changes to indicate ischemia. Small, moderate intensity, reversible apical to basal inferolateral defect consistent with ischemia. This is a low risk study. Nuclear stress EF: 84%."   Hyperlipidemia    Hypothyroidism    PE (pulmonary thromboembolism) (HCC)    Renal  disorder    cyst on kidney    Silent micro-hemorrhage of brain (HCC) 11/25/2018   Stroke (HCC)    TIA (transient ischemic attack) 11/25/2018   Vertigo      Allergies  Allergen Reactions   Tape Rash    Zio monitor adhesive causes ulcerated and infected skin .Had to see derm     Current Outpatient Medications  Medication Sig Dispense Refill   acetaminophen  (TYLENOL ) 500 MG tablet Take 500 mg by mouth every 8 (eight) hours as needed for mild pain or headache.     apixaban  (ELIQUIS ) 2.5 MG TABS tablet Take 1 tablet (2.5 mg total) by mouth 2 (two) times daily. 60 tablet 11   ascorbic acid  (VITAMIN C ) 250 MG CHEW Chew 250 mg by mouth daily.     ASPERCREME LIDOCAINE  EX Apply 1 application topically daily as needed (for knee pain).      chlorthalidone (HYGROTON) 25 MG tablet Take 12.5 mg by mouth daily.     cholecalciferol  (VITAMIN D3) 25 MCG (1000 UT) tablet Take 1,000 Units by mouth daily.      diltiazem  (CARDIZEM  CD) 180 MG 24 hr capsule Take 1 capsule (180 mg total) by mouth daily. 90 capsule 3   diltiazem  (CARDIZEM ) 30 MG tablet Take 1 tablet (30  mg total) by mouth every 8 (eight) hours as needed (Palpitations). 90 tablet 2   ezetimibe  (ZETIA ) 10 MG tablet TAKE (1) TABLET BY MOUTH ONCE DAILY. 90 tablet 3   fluticasone  (FLONASE ) 50 MCG/ACT nasal spray Place 2 sprays into both nostrils daily. 16 g 1   gabapentin  (NEURONTIN ) 300 MG capsule TAKE ONE CAPSULE BY MOUTH TWICE DAILY 60 capsule 4   levETIRAcetam  (KEPPRA ) 250 MG tablet Take 1 tablet (250 mg total) by mouth 2 (two) times daily. (Patient taking differently: Take 250 mg by mouth 2 (two) times daily. 1/2 tablet twice daily) 180 tablet 3   loratadine (CLARITIN) 10 MG tablet Take 10 mg by mouth daily.      lubiprostone  (AMITIZA ) 24 MCG capsule Take 1 capsule (24 mcg total) by mouth daily with breakfast. 30 capsule 2   meclizine  (ANTIVERT ) 25 MG tablet Take 1 tablet (25 mg total) by mouth 2 (two) times daily as needed for dizziness. 30  tablet 0   metoprolol  succinate (TOPROL -XL) 100 MG 24 hr tablet TAKE 1 TABLET BY MOUTH IN THE MORNING AND AT BEDTIME. TAKE WITH OR IMMEDIATELY FOLLOWING A MEAL. 180 tablet 1   pantoprazole  (PROTONIX ) 40 MG tablet TAKE ONE TABLET BY MOUTH EVERY DAY 30 tablet 1   RESTASIS  0.05 % ophthalmic emulsion 1 drop 2 (two) times daily.     rosuvastatin  (CRESTOR ) 40 MG tablet TAKE ONE TABLET BY MOUTH ONCE DAILY. 90 tablet 2   topiramate  (TOPAMAX ) 50 MG tablet TAKE ONE TABLET BY MOUTH TWICE DAILY 90 tablet 6   traMADol  (ULTRAM ) 50 MG tablet Take 0.5-1 tablets (25-50 mg total) by mouth every 12 (twelve) hours as needed for severe pain. 15 tablet 0   UNABLE TO FIND Flutter value- acapella device- use as instructed 1 each 0   UNABLE TO FIND 1 each by Does not apply route daily. Med Name: Blood pressure cuff 1 each 0   No current facility-administered medications for this visit.     Past Surgical History:  Procedure Laterality Date   ABDOMINAL HYSTERECTOMY     COLON SURGERY  07/2009   right hemicolectomy, no residual colon cancer on path   COLONOSCOPY  07/16/2010   WRU:[id number] POLYP-TCS 3 YEARS   COLONOSCOPY N/A 08/02/2013   hyperplastic polyps, surveillance in 2020 if benefits outweight the risks   COLONOSCOPY  06/2009   1.2 cm sessile cecal polyp which had adenocarcinoma arising in a tubular adenoma.   PARTIAL THYMECTOMY     partial thyroidectomy     benign tumors     Allergies  Allergen Reactions   Tape Rash    Zio monitor adhesive causes ulcerated and infected skin .Had to see derm      Family History  Problem Relation Age of Onset   Colon cancer Mother        >age3   Arthritis Mother    Cancer Mother    Heart disease Mother    Hyperlipidemia Mother    Hypertension Mother    Heart attack Father    Heart disease Father    Diabetes Maternal Aunt    Hyperlipidemia Daughter    Hypertension Daughter    Liver disease Neg Hx      Social History [name] reports that  she has never smoked. She has never used smokeless tobacco. [name] reports no history of alcohol use.     Physical Examination Today's Vitals   08/21/23 1523  BP: 118/68  Pulse: 63  SpO2: 96%  Weight:  149 lb 9.6 oz (67.9 kg)  Height: 5\' 3"  (1.6 m)  PainSc: 0-No pain   Body mass index is 26.5 kg/m.  Gen: resting comfortably, no acute distress HEENT: no scleral icterus, pupils equal round and reactive, no palptable cervical adenopathy,  CV: RRR, no mrg, no jvd Resp: Clear to auscultation bilaterally GI: abdomen is soft, non-tender, non-distended, normal bowel sounds, no hepatosplenomegaly MSK: extremities are warm, no edema.  Skin: warm, no rash Neuro:  no focal deficits Psych: appropriate affect   Diagnostic Studies 07/2021 echo 1. Left ventricular ejection fraction, by estimation, is 65 to 70%. The  left ventricle has normal function. The left ventricle has no regional  wall motion abnormalities. There is mild concentric left ventricular  hypertrophy. Left ventricular diastolic  parameters are consistent with Grade I diastolic dysfunction (impaired  relaxation).   2. Right ventricular systolic function is normal. The right ventricular  size is normal. There is mildly elevated pulmonary artery systolic  pressure. The estimated right ventricular systolic pressure is 39.0 mmHg.   3. The mitral valve is grossly normal. Trivial mitral valve  regurgitation.   4. The aortic valve is tricuspid. Aortic valve regurgitation is not  visualized. Aortic valve sclerosis is present, with no evidence of aortic  valve stenosis. Aortic valve mean gradient measures 3.0 mmHg.   5. The inferior vena cava is normal in size with greater than 50%  respiratory variability, suggesting right atrial pressure of 3 mmHg.      09/2022 echo IMPRESSIONS     1. Left ventricular ejection fraction, by estimation, is 60 to 65%. The  left ventricle has normal function. The left ventricle has  no regional  wall motion abnormalities. Left ventricular diastolic parameters are  consistent with Grade I diastolic  dysfunction (impaired relaxation).   2. Right ventricular systolic function is normal. The right ventricular  size is normal. There is moderately elevated pulmonary artery systolic  pressure.   3. The mitral valve is normal in structure. Trivial mitral valve  regurgitation. No evidence of mitral stenosis.   4. Tricuspid valve regurgitation is moderate.   5. The aortic valve is tricuspid. Aortic valve regurgitation is not  visualized.       Assessment and Plan  1. Palpitations - prior monitor showed PAC/PVCs and SVT - doing well without significant symptoms, continue current meds   2. HTN - at goal, continue current meds   3. Hyperlipdiemia - at goal, continue current meds  F/u 6 months     Laurann Pollock, M.D.

## 2023-08-21 NOTE — Patient Instructions (Signed)
 Medication Instructions:  Your physician recommends that you continue on your current medications as directed. Please refer to the Current Medication list given to you today.   Labwork: None today  Testing/Procedures: None today  Follow-Up: 6 months  Any Other Special Instructions Will Be Listed Below (If Applicable).  If you need a refill on your cardiac medications before your next appointment, please call your pharmacy.

## 2023-08-22 ENCOUNTER — Ambulatory Visit (HOSPITAL_COMMUNITY): Admission: RE | Admit: 2023-08-22 | Source: Ambulatory Visit

## 2023-08-23 ENCOUNTER — Ambulatory Visit: Payer: Self-pay | Admitting: Internal Medicine

## 2023-09-12 ENCOUNTER — Encounter (INDEPENDENT_AMBULATORY_CARE_PROVIDER_SITE_OTHER): Admitting: Ophthalmology

## 2023-09-12 ENCOUNTER — Other Ambulatory Visit: Payer: Self-pay | Admitting: Cardiology

## 2023-09-12 DIAGNOSIS — H43813 Vitreous degeneration, bilateral: Secondary | ICD-10-CM | POA: Diagnosis not present

## 2023-09-12 DIAGNOSIS — H35033 Hypertensive retinopathy, bilateral: Secondary | ICD-10-CM

## 2023-09-12 DIAGNOSIS — H34831 Tributary (branch) retinal vein occlusion, right eye, with macular edema: Secondary | ICD-10-CM

## 2023-09-12 DIAGNOSIS — I10 Essential (primary) hypertension: Secondary | ICD-10-CM | POA: Diagnosis not present

## 2023-09-13 ENCOUNTER — Encounter (INDEPENDENT_AMBULATORY_CARE_PROVIDER_SITE_OTHER): Admitting: Ophthalmology

## 2023-09-29 ENCOUNTER — Other Ambulatory Visit: Payer: Self-pay | Admitting: Internal Medicine

## 2023-09-30 ENCOUNTER — Other Ambulatory Visit: Payer: Self-pay | Admitting: Cardiology

## 2023-10-04 ENCOUNTER — Ambulatory Visit (HOSPITAL_COMMUNITY)
Admission: RE | Admit: 2023-10-04 | Discharge: 2023-10-04 | Disposition: A | Discharge status: Home / Self Care | Source: Ambulatory Visit | Attending: Physician Assistant | Admitting: Physician Assistant

## 2023-10-04 ENCOUNTER — Ambulatory Visit: Payer: Self-pay | Admitting: Physician Assistant

## 2023-10-04 DIAGNOSIS — R911 Solitary pulmonary nodule: Secondary | ICD-10-CM | POA: Diagnosis not present

## 2023-10-04 DIAGNOSIS — E041 Nontoxic single thyroid nodule: Secondary | ICD-10-CM | POA: Diagnosis not present

## 2023-10-04 DIAGNOSIS — J984 Other disorders of lung: Secondary | ICD-10-CM | POA: Diagnosis not present

## 2023-10-04 DIAGNOSIS — R918 Other nonspecific abnormal finding of lung field: Secondary | ICD-10-CM | POA: Diagnosis not present

## 2023-10-09 NOTE — Progress Notes (Signed)
 Notified daughter, Orie of results and verbalized understanding.

## 2023-10-09 NOTE — Progress Notes (Signed)
 Attempted to contact daughter with results.  No answer.  Will follow up.

## 2023-10-22 ENCOUNTER — Other Ambulatory Visit: Payer: Self-pay | Admitting: Internal Medicine

## 2023-10-22 DIAGNOSIS — K5904 Chronic idiopathic constipation: Secondary | ICD-10-CM

## 2023-11-08 ENCOUNTER — Encounter (INDEPENDENT_AMBULATORY_CARE_PROVIDER_SITE_OTHER): Admitting: Ophthalmology

## 2023-11-08 DIAGNOSIS — H43813 Vitreous degeneration, bilateral: Secondary | ICD-10-CM | POA: Diagnosis not present

## 2023-11-08 DIAGNOSIS — I10 Essential (primary) hypertension: Secondary | ICD-10-CM

## 2023-11-08 DIAGNOSIS — H3561 Retinal hemorrhage, right eye: Secondary | ICD-10-CM

## 2023-11-08 DIAGNOSIS — H35033 Hypertensive retinopathy, bilateral: Secondary | ICD-10-CM | POA: Diagnosis not present

## 2023-11-08 DIAGNOSIS — H348112 Central retinal vein occlusion, right eye, stable: Secondary | ICD-10-CM

## 2023-11-16 ENCOUNTER — Ambulatory Visit: Payer: Medicare Other | Admitting: Neurology

## 2023-11-16 ENCOUNTER — Encounter: Payer: Self-pay | Admitting: Neurology

## 2023-11-16 VITALS — BP 150/80 | HR 58 | Temp 99.3°F | Wt 148.5 lb

## 2023-11-16 DIAGNOSIS — R569 Unspecified convulsions: Secondary | ICD-10-CM | POA: Diagnosis not present

## 2023-11-16 DIAGNOSIS — R519 Headache, unspecified: Secondary | ICD-10-CM | POA: Diagnosis not present

## 2023-11-16 DIAGNOSIS — G8929 Other chronic pain: Secondary | ICD-10-CM

## 2023-11-16 NOTE — Patient Instructions (Signed)
 Decrease Keppra  to 125 mg nightly for 1 month then stop the medication Continue your other medications Continue to follow-up PCP Return in 1 year or sooner if worse.

## 2023-11-16 NOTE — Progress Notes (Signed)
 [facility]  PATIENT: [name] DOB: 1934-04-13  REQUESTING CLINICIAN: Tobie Suzzane POUR, MD HISTORY FROM: Patient, daughter and chart review  REASON FOR VISIT: Headaches, seizure disorder TOC from [facility]    HISTORICAL  CHIEF COMPLAINT:  Chief Complaint  Patient presents with   Follow-up   INTERVAL HISTORY 11/16/2023 Patient presents today for follow-up, she is accompanied by her daughter.  Last visit was a year ago since then she tells me she is doing well, has not had any recent fall, no seizures, her appetite is good and her sleep is good.  Currently she does not have any question or concern and no complaints. In terms of the headaches, they are stable, she will get headaches 2-3 times a month requiring use of small dose of tramadol .   INTERVAL HISTORY 11/16/2022:  Patient presents for follow-up, she is accompanied by her daughter.  Last visit was in March and since then, she has been doing well no seizure or seizure like activity.  She is compliant with the Keppra  250 mg twice daily, denies any side effect.  Patient tells me during this time, she has been struggling with dizziness, she has been seen in the ED for the same.  She was recommended to increase her fluid intake and she is working on increasing her fluid intake daily.  She feels her memory is okay, sometimes she will forget what her daughter told her or misplacing items but nothing more than that.   HISTORY OF PRESENT ILLNESS:  This is a 89 year old woman with multiple medical conditions including hypertension, hyperlipidemia, hypothyroidism, TIA, vertigo, seizure disorder chronic headaches, who is presenting to establish care.  Patient was previously managed at [facility] neurology by Dr. Milton.  History today is mainly obtained via chart review and from patient's daughter.  She reports about 5 years ago she had an event of altered mental status, confusion.  Daughter reports  calling patient throughout the day and unable to reach her and when she went to her house she was just keeps on repeating what's wrong  what's wrong  what's wrong.  She was taken to the hospital, initial workup was negative.  They follow-up with Dr. Milton who treated the event as seizure and started the patient on Keppra  250 mg twice daily since then he had she has not had any additional event.  She did also have a routine EEG which was normal.  Patient is also complaining of chronic headache.  Her headaches are mainly left-sided. She will have moderate headache at least once every other week that she might take Tylenol  and she will have severe headaches at least once every 4 to 6 weeks, these headaches can last up to 3 days and she takes tramadol  12.5 mg She described her headaches are strong dull pain, denies any other associating features as sensitivity to light or noise or nausea and vomiting.  Patient also have chronic vertigo and some other chronic headache symptoms.   OTHER MEDICAL CONDITIONS: Chronic headaches, seizures, hypertension, hyperlipidemia, hypothyroidism, TIA, vertigo   REVIEW OF SYSTEMS: Full 14 system review of systems performed and negative with exception of: As noted in the HPI   ALLERGIES: Allergies  Allergen Reactions   Tape Rash    Zio monitor adhesive causes ulcerated and infected skin .Had to see derm    HOME MEDICATIONS: Outpatient Medications Prior to Visit  Medication Sig Dispense Refill   acetaminophen  (TYLENOL ) 500 MG tablet Take 500 mg by mouth every 8 (  eight) hours as needed for mild pain or headache.     apixaban  (ELIQUIS ) 2.5 MG TABS tablet Take 1 tablet (2.5 mg total) by mouth 2 (two) times daily. 60 tablet 11   ascorbic acid  (VITAMIN C ) 250 MG CHEW Chew 250 mg by mouth daily.     ASPERCREME LIDOCAINE  EX Apply 1 application topically daily as needed (for knee pain).      chlorthalidone (HYGROTON) 25 MG tablet Take 12.5 mg by mouth daily.      cholecalciferol  (VITAMIN D3) 25 MCG (1000 UT) tablet Take 1,000 Units by mouth daily.      diltiazem  (CARDIZEM  CD) 180 MG 24 hr capsule TAKE ONE CAPSULE BY MOUTH EVERY DAY 90 capsule 3   diltiazem  (CARDIZEM ) 30 MG tablet Take 1 tablet (30 mg total) by mouth every 8 (eight) hours as needed (Palpitations). 90 tablet 2   ezetimibe  (ZETIA ) 10 MG tablet TAKE (1) TABLET BY MOUTH ONCE DAILY. 90 tablet 3   fluticasone  (FLONASE ) 50 MCG/ACT nasal spray Place 2 sprays into both nostrils daily. 16 g 1   gabapentin  (NEURONTIN ) 300 MG capsule TAKE ONE CAPSULE BY MOUTH TWICE DAILY 60 capsule 4   loratadine (CLARITIN) 10 MG tablet Take 10 mg by mouth daily.      lubiprostone  (AMITIZA ) 24 MCG capsule TAKE ONE CAPSULE BY MOUTH DAILY WITH BREAKFAST 30 capsule 2   meclizine  (ANTIVERT ) 25 MG tablet Take 1 tablet (25 mg total) by mouth 2 (two) times daily as needed for dizziness. 30 tablet 0   metoprolol  succinate (TOPROL -XL) 100 MG 24 hr tablet Take 1 tablet (100 mg total) by mouth in the morning and at bedtime. 180 tablet 3   pantoprazole  (PROTONIX ) 40 MG tablet TAKE ONE TABLET BY MOUTH EVERY DAY 30 tablet 1   RESTASIS  0.05 % ophthalmic emulsion 1 drop 2 (two) times daily.     rosuvastatin  (CRESTOR ) 40 MG tablet TAKE ONE TABLET BY MOUTH ONCE DAILY. 90 tablet 2   topiramate  (TOPAMAX ) 50 MG tablet TAKE ONE TABLET BY MOUTH TWICE DAILY 90 tablet 6   traMADol  (ULTRAM ) 50 MG tablet Take 0.5-1 tablets (25-50 mg total) by mouth every 12 (twelve) hours as needed for severe pain. 15 tablet 0   UNABLE TO FIND Flutter value- acapella device- use as instructed 1 each 0   UNABLE TO FIND 1 each by Does not apply route daily. Med Name: Blood pressure cuff 1 each 0   levETIRAcetam  (KEPPRA ) 250 MG tablet Take 1 tablet (250 mg total) by mouth 2 (two) times daily. (Patient taking differently: Take 250 mg by mouth 2 (two) times daily. 1/2 tablet twice daily) 180 tablet 3   No facility-administered medications prior to visit.    PAST  MEDICAL HISTORY: Past Medical History:  Diagnosis Date   Allergy    Arthritis    Colon cancer (HCC)    colon ca dx 07/30/09   History of cardiac monitoring 07/2017   Event monitor demonstrated sinus rhythm with isolated PACs and no arrhythmias   History of colon cancer 06/2009   found at time of TCS 06/29/09, 1.2cm sessile cecal polyp, no adjuvent therapy needed   HTN (hypertension)    Hx of cardiovascular stress test 07/2017   No diagnostic ST segment changes to indicate ischemia. Small, moderate intensity, reversible apical to basal inferolateral defect consistent with ischemia. This is a low risk study. Nuclear stress EF: 84%.   Hyperlipidemia    Hypothyroidism    PE (pulmonary thromboembolism) (HCC)  Renal disorder    cyst on kidney    Silent micro-hemorrhage of brain (HCC) 11/25/2018   Stroke [facility])    TIA (transient ischemic attack) 11/25/2018   Vertigo     PAST SURGICAL HISTORY: Past Surgical History:  Procedure Laterality Date   ABDOMINAL HYSTERECTOMY     COLON SURGERY  07/2009   right hemicolectomy, no residual colon cancer on path   COLONOSCOPY  07/16/2010   DOQ:[id number] POLYP-TCS 3 YEARS   COLONOSCOPY N/A 08/02/2013   hyperplastic polyps, surveillance in 2020 if benefits outweight the risks   COLONOSCOPY  06/2009   1.2 cm sessile cecal polyp which had adenocarcinoma arising in a tubular adenoma.   PARTIAL THYMECTOMY     partial thyroidectomy     benign tumors    FAMILY HISTORY: Family History  Problem Relation Age of Onset   Colon cancer Mother        >age55   Arthritis Mother    Cancer Mother    Heart disease Mother    Hyperlipidemia Mother    Hypertension Mother    Heart attack Father    Heart disease Father    Diabetes Maternal Aunt    Hyperlipidemia Daughter    Hypertension Daughter    Liver disease Neg Hx     SOCIAL HISTORY: Social History   Socioeconomic History   Marital status: Widowed    Spouse name: Not on file   Number of  children: 2   Years of education: Not on file   Highest education level: Not on file  Occupational History   Occupation: Agricultural consultant at [employer]    Employer: RETIRED   Occupation: retired from Designer, fashion/clothing  Tobacco Use   Smoking status: Never   Smokeless tobacco: Never  Vaping Use   Vaping status: Never Used  Substance and Sexual Activity   Alcohol use: No    Alcohol/week: 0.0 standard drinks of alcohol   Drug use: No   Sexual activity: Not Currently  Other Topics Concern   Not on file  Social History Narrative   Not on file   Social Drivers of Health   Financial Resource Strain: Low Risk  (11/30/2022)   Overall Financial Resource Strain (CARDIA)    Difficulty of Paying Living Expenses: Not hard at all  Food Insecurity: No Food Insecurity (11/30/2022)   Hunger Vital Sign    Worried About Running Out of Food in the Last Year: Never true    Ran Out of Food in the Last Year: Never true  Transportation Needs: No Transportation Needs (11/30/2022)   PRAPARE - Administrator, Civil Service (Medical): No    Lack of Transportation (Non-Medical): No  Physical Activity: Insufficiently Active (11/30/2022)   Exercise Vital Sign    Days of Exercise per Week: 4 days    Minutes of Exercise per Session: 10 min  Stress: No Stress Concern Present (11/30/2022)   [employer] of Occupational Health - Occupational Stress Questionnaire    Feeling of Stress : Only a little  Social Connections: Moderately Isolated (11/30/2022)   Social Connection and Isolation Panel    Frequency of Communication with Friends and Family: More than three times a week    Frequency of Social Gatherings with Friends and Family: More than three times a week    Attends Religious Services: More than 4 times per year    Active Member of [employer] or Organizations: No    Attends Banker Meetings: Never    Marital Status: Widowed  Intimate Partner Violence: Not At Risk (11/30/2022)   Humiliation, Afraid, Rape,  and Kick questionnaire    Fear of Current or Ex-Partner: No    Emotionally Abused: No    Physically Abused: No    Sexually Abused: No    PHYSICAL EXAM  GENERAL EXAM/CONSTITUTIONAL: Vitals:  Vitals:   11/16/23 1530  BP: (!) 150/80  Pulse: (!) 58  Temp: 99.3 F (37.4 C)  SpO2: 95%  Weight: 148 lb 8 oz (67.4 kg)    Body mass index is 26.31 kg/m. Wt Readings from Last 3 Encounters:  11/16/23 148 lb 8 oz (67.4 kg)  08/21/23 149 lb 9.6 oz (67.9 kg)  07/24/23 149 lb 9.6 oz (67.9 kg)   Patient is in no distress; well developed, nourished and groomed; neck is supple  MUSCULOSKELETAL: Gait, strength, tone, movements noted in Neurologic exam below  NEUROLOGIC: MENTAL STATUS:     11/16/2022    3:41 PM  MMSE - Mini Mental State Exam  Orientation to time 4  Orientation to Place 4  Registration 3  Attention/ Calculation 5  Recall 1  Language- name 2 objects 2  Language- repeat 1  Language- follow 3 step command 3  Language- read & follow direction 1  Write a sentence 1  Copy design 1  Total score 26    CRANIAL NERVE:  2nd, 3rd, 4th, 6th - Visual fields full to confrontation, extraocular muscles intact, no nystagmus 5th - facial sensation symmetric 7th - facial strength symmetric 8th - hearing intact 9th - palate elevates symmetrically, uvula midline 11th - shoulder shrug symmetric 12th - tongue protrusion midline  MOTOR:  normal bulk and tone, full strength in the BUE, BLE  SENSORY:  normal and symmetric to light touch  COORDINATION:  finger-nose-finger, fine finger movements normal  GAIT/STATION:  Deferred, using a wheelchair today, but ambulates with a walker    DIAGNOSTIC DATA (LABS, IMAGING, TESTING) - I reviewed patient records, labs, notes, testing and imaging myself where available.  Lab Results  Component Value Date   WBC 4.4 06/19/2023   HGB 12.6 06/19/2023   HCT 39.6 06/19/2023   MCV 102.9 (H) 06/19/2023   PLT 217 06/19/2023       Component Value Date/Time   NA 141 06/19/2023 1456   NA 146 (H) 10/17/2022 1444   NA 144 04/16/2015 0812   K 3.5 06/19/2023 1456   K 3.9 04/16/2015 0812   CL 108 06/19/2023 1456   CL 105 04/02/2012 0914   CO2 24 06/19/2023 1456   CO2 29 04/16/2015 0812   GLUCOSE 110 (H) 06/19/2023 1456   GLUCOSE 102 04/16/2015 0812   GLUCOSE 105 (H) 04/02/2012 0914   BUN 21 06/19/2023 1456   BUN 23 10/17/2022 1444   BUN 12.4 04/16/2015 0812   CREATININE 1.76 (H) 06/19/2023 1456   CREATININE 1.52 (H) 09/20/2019 1605   CREATININE 1.2 (H) 04/16/2015 0812   CALCIUM  9.1 06/19/2023 1456   CALCIUM  10.0 04/16/2015 0812   PROT 7.3 06/19/2023 1456   PROT 6.9 12/08/2020 0956   PROT 8.2 04/16/2015 0812   ALBUMIN 3.7 06/19/2023 1456   ALBUMIN 4.0 12/08/2020 0956   ALBUMIN 4.0 04/16/2015 0812   AST 29 06/19/2023 1456   AST 18 04/16/2015 0812   ALT 38 06/19/2023 1456   ALT 19 04/16/2015 0812   ALKPHOS 79 06/19/2023 1456   ALKPHOS 103 04/16/2015 0812   BILITOT 0.6 06/19/2023 1456   BILITOT 0.6 12/08/2020 0956   BILITOT 0.78 04/16/2015 9187  GFRNONAA 28 (L) 06/19/2023 1456   GFRNONAA 36 (L) 11/04/2016 0854   GFRAA 41 (L) 12/03/2019 1214   GFRAA 42 (L) 11/04/2016 0854   Lab Results  Component Value Date   CHOL 133 03/27/2023   HDL 55 03/27/2023   LDLCALC 66 03/27/2023   TRIG 55 03/27/2023   CHOLHDL 2.4 03/27/2023   Lab Results  Component Value Date   HGBA1C 6.3 (H) 03/27/2023   No results found for: VITAMINB12 Lab Results  Component Value Date   TSH 0.694 03/27/2023     ASSESSMENT AND PLAN  89 y.o. year old female with chronic headaches, seizure disorder, hypertension, hyperlipidemia, hypothyroidism, TIA, vertigo here for follow up.  She is doing well on Keppra  125 mg twice daily, has not had any seizure or seizure like activity.  Plan will be to decrease Keppra  to 125 mg nightly for 1 month then discontinue Keppra .  Advised her to continue her other medications.  When it comes to her  cognitive impairment, she is stable, daughter is not reporting any worsening of her memory.  Advised her to continue current medications, continue follow-up PCP and return in a year or sooner if worse   1. Chronic nonintractable headache, unspecified headache type   2. Seizure-like activity (HCC)      Patient Instructions  Decrease Keppra  to 125 mg nightly for 1 month then stop the medication Continue your other medications Continue to follow-up PCP Return in 1 year or sooner if worse.  No orders of the defined types were placed in this encounter.   No orders of the defined types were placed in this encounter.   Return in about 1 year (around 11/15/2024).   Pastor Falling, MD 11/16/2023, 4:51 PM  [facility] [address], [address] [place], [place] [address] [phone]

## 2023-11-24 ENCOUNTER — Other Ambulatory Visit: Payer: Self-pay | Admitting: Internal Medicine

## 2023-11-24 DIAGNOSIS — G609 Hereditary and idiopathic neuropathy, unspecified: Secondary | ICD-10-CM

## 2023-11-28 ENCOUNTER — Telehealth: Payer: Self-pay

## 2023-11-28 NOTE — Telephone Encounter (Signed)
 Spoke with patient daughter and informed her that AWV is a telephone call

## 2023-11-28 NOTE — Telephone Encounter (Signed)
 Copied from CRM [id number]. Topic: Appointments - Scheduling Inquiry for Clinic >> Nov 28, 2023  1:12 PM Charlet HERO wrote: Reason for CRM: Patients daughter is calling about the AWV appt for 09/23 it is showing virtual and the patient is not able to do a virtual appt wants to make sure that the appt is phone instead. Please call daughter back at this number.[phone]

## 2023-12-04 DIAGNOSIS — I10 Essential (primary) hypertension: Secondary | ICD-10-CM | POA: Diagnosis not present

## 2023-12-04 DIAGNOSIS — N189 Chronic kidney disease, unspecified: Secondary | ICD-10-CM | POA: Diagnosis not present

## 2023-12-04 DIAGNOSIS — E119 Type 2 diabetes mellitus without complications: Secondary | ICD-10-CM | POA: Diagnosis not present

## 2023-12-04 DIAGNOSIS — D7589 Other specified diseases of blood and blood-forming organs: Secondary | ICD-10-CM | POA: Diagnosis not present

## 2023-12-04 DIAGNOSIS — D539 Nutritional anemia, unspecified: Secondary | ICD-10-CM | POA: Diagnosis not present

## 2023-12-05 ENCOUNTER — Ambulatory Visit (INDEPENDENT_AMBULATORY_CARE_PROVIDER_SITE_OTHER): Payer: Medicare Other

## 2023-12-05 VITALS — Ht 63.0 in | Wt 148.0 lb

## 2023-12-05 DIAGNOSIS — Z0000 Encounter for general adult medical examination without abnormal findings: Secondary | ICD-10-CM

## 2023-12-05 NOTE — Patient Instructions (Signed)
 [name],  Thank you for taking the time for your Medicare Wellness Visit. I appreciate your continued commitment to your health goals. Please review the care plan we discussed, and feel free to reach out if I can assist you further.  Medicare recommends these wellness visits once per year to help you and your care team stay ahead of potential health issues. These visits are designed to focus on prevention, allowing your provider to concentrate on managing your acute and chronic conditions during your regular appointments.  Please note that Annual Wellness Visits do not include a physical exam. Some assessments may be limited, especially if the visit was conducted virtually. If needed, we may recommend a separate in-person follow-up with your provider.  No Referrals were placed during today's visit.   Wishing you excellent health and many blessings in the year to come!  -Brittney Mucha, CMA  Ongoing Care Seeing your primary care provider every 3 to 6 months helps us  monitor your health and provide consistent, personalized care.   Recommended Screenings:  Health Maintenance  Topic Date Due   DTaP/Tdap/Td vaccine (1 - Tdap) Never done   Zoster (Shingles) Vaccine (1 of 2) 11/23/1955   Flu Shot  10/13/2023   COVID-19 Vaccine (5 - 2025-26 season) 11/13/2023   Medicare Annual Wellness Visit  12/04/2024   Pneumococcal Vaccine for age over 34  Completed   DEXA scan (bone density measurement)  Completed   HPV Vaccine  Aged Out   Meningitis B Vaccine  Aged Out       12/05/2023   10:07 AM  Advanced Directives  Does Patient Have a Medical Advance Directive? Yes  Type of Estate agent of [place];Living will  Does patient want to make changes to medical advance directive? No - Patient declined  Copy of Healthcare Power of Attorney in Chart? Yes - validated most recent copy scanned in chart (See row information)   Advance Care Planning is important because it: Ensures you  receive medical care that aligns with your values, goals, and preferences. Provides guidance to your family and loved ones, reducing the emotional burden of decision-making during critical moments.  Vision: Annual vision screenings are recommended for early detection of glaucoma, cataracts, and diabetic retinopathy. These exams can also reveal signs of chronic conditions such as diabetes and high blood pressure.  Dental: Annual dental screenings help detect early signs of oral cancer, gum disease, and other conditions linked to overall health, including heart disease and diabetes.  Please see the attached documents for additional preventive care recommendations.

## 2023-12-05 NOTE — Progress Notes (Signed)
 Subjective:   [name] is a 89 y.o. who presents for a Medicare Wellness preventive visit.  As a reminder, Annual Wellness Visits don't include a physical exam, and some assessments may be limited, especially if this visit is performed virtually. We may recommend an in-person follow-up visit with your provider if needed.  Visit Complete: Virtual I connected with  Landon KATHEE Mustache on 12/05/23 by a audio enabled telemedicine application and verified that I am speaking with the correct person using two identifiers.  Patient Location: Home  Provider Location: Home Office  I discussed the limitations of evaluation and management by telemedicine. The patient expressed understanding and agreed to proceed.  Vital Signs: Because this visit was a virtual/telehealth visit, some criteria may be missing or patient reported. Any vitals not documented were not able to be obtained and vitals that have been documented are patient reported.  VideoDeclined- This patient declined Librarian, academic. Therefore the visit was completed with audio only.  Persons Participating in Visit: Patient.  AWV Questionnaire: No: Patient Medicare AWV questionnaire was not completed prior to this visit.  Cardiac Risk Factors include: advanced age (>15men, >37 women);dyslipidemia;hypertension;sedentary lifestyle;Other (see comment), Risk factor comments: history of stroke, seizures     Objective:    Today's Vitals   12/05/23 1015  Weight: 148 lb (67.1 kg)  Height: 5' 3 (1.6 m)   Body mass index is 26.22 kg/m.     12/05/2023   10:07 AM 06/26/2023    1:58 PM 01/30/2023    2:10 PM 12/19/2022   11:50 AM 11/30/2022   10:37 AM 10/03/2022    2:10 AM 09/30/2022    1:31 PM  Advanced Directives  Does Patient Have a Medical Advance Directive? Yes Yes Yes Yes Yes Yes Yes  Type of Estate agent of [place];Living will Healthcare Power of [place];Living  will Healthcare Power of [place];Living will Healthcare Power of [place];Living will Healthcare Power of [place];Living will Healthcare Power of [employer] of [place];Living will  Does patient want to make changes to medical advance directive? No - Patient declined No - Patient declined No - Patient declined No - Patient declined No - Patient declined No - Patient declined   Copy of Healthcare Power of Attorney in Chart? Yes - validated most recent copy scanned in chart (See row information) No - copy requested No - copy requested Yes - validated most recent copy scanned in chart (See row information) Yes - validated most recent copy scanned in chart (See row information)    Would patient like information on creating a medical advance directive?  No - Patient declined No - Patient declined No - Patient declined       Current Medications (verified) Outpatient Encounter Medications as of 12/05/2023  Medication Sig   acetaminophen  (TYLENOL ) 500 MG tablet Take 500 mg by mouth every 8 (eight) hours as needed for mild pain or headache.   apixaban  (ELIQUIS ) 2.5 MG TABS tablet Take 1 tablet (2.5 mg total) by mouth 2 (two) times daily.   ascorbic acid  (VITAMIN C ) 250 MG CHEW Chew 250 mg by mouth daily.   ASPERCREME LIDOCAINE  EX Apply 1 application topically daily as needed (for knee pain).    chlorthalidone (HYGROTON) 25 MG tablet Take 12.5 mg by mouth daily.   cholecalciferol  (VITAMIN D3) 25 MCG (1000 UT) tablet Take 1,000 Units by mouth daily.    diltiazem  (CARDIZEM  CD) 180 MG 24 hr capsule TAKE ONE CAPSULE BY MOUTH EVERY  DAY   diltiazem  (CARDIZEM ) 30 MG tablet Take 1 tablet (30 mg total) by mouth every 8 (eight) hours as needed (Palpitations).   ezetimibe  (ZETIA ) 10 MG tablet TAKE (1) TABLET BY MOUTH ONCE DAILY.   fluticasone  (FLONASE ) 50 MCG/ACT nasal spray Place 2 sprays into both nostrils daily.   gabapentin  (NEURONTIN ) 300 MG capsule TAKE ONE CAPSULE BY MOUTH TWICE DAILY    loratadine (CLARITIN) 10 MG tablet Take 10 mg by mouth daily.    lubiprostone  (AMITIZA ) 24 MCG capsule TAKE ONE CAPSULE BY MOUTH DAILY WITH BREAKFAST   meclizine  (ANTIVERT ) 25 MG tablet Take 1 tablet (25 mg total) by mouth 2 (two) times daily as needed for dizziness.   metoprolol  succinate (TOPROL -XL) 100 MG 24 hr tablet Take 1 tablet (100 mg total) by mouth in the morning and at bedtime.   pantoprazole  (PROTONIX ) 40 MG tablet TAKE ONE TABLET BY MOUTH EVERY DAY   RESTASIS  0.05 % ophthalmic emulsion 1 drop 2 (two) times daily.   rosuvastatin  (CRESTOR ) 40 MG tablet TAKE ONE TABLET BY MOUTH ONCE DAILY.   topiramate  (TOPAMAX ) 50 MG tablet TAKE ONE TABLET BY MOUTH TWICE DAILY   traMADol  (ULTRAM ) 50 MG tablet Take 0.5-1 tablets (25-50 mg total) by mouth every 12 (twelve) hours as needed for severe pain.   UNABLE TO FIND Flutter value- acapella device- use as instructed   UNABLE TO FIND 1 each by Does not apply route daily. Med Name: Blood pressure cuff   No facility-administered encounter medications on file as of 12/05/2023.    Allergies (verified) Tape   History: Past Medical History:  Diagnosis Date   Allergy    Arthritis    Colon cancer (HCC)    colon ca dx 07/30/09   History of cardiac monitoring 07/2017   Event monitor demonstrated sinus rhythm with isolated PACs and no arrhythmias   History of colon cancer 06/2009   found at time of TCS 06/29/09, 1.2cm sessile cecal polyp, no adjuvent therapy needed   HTN (hypertension)    Hx of cardiovascular stress test 07/2017   No diagnostic ST segment changes to indicate ischemia. Small, moderate intensity, reversible apical to basal inferolateral defect consistent with ischemia. This is a low risk study. Nuclear stress EF: 84%.   Hyperlipidemia    Hypothyroidism    PE (pulmonary thromboembolism) (HCC)    Renal disorder    cyst on kidney    Silent micro-hemorrhage of brain (HCC) 11/25/2018   Stroke [facility])    TIA (transient ischemic attack)  11/25/2018   Vertigo    Past Surgical History:  Procedure Laterality Date   ABDOMINAL HYSTERECTOMY     COLON SURGERY  07/2009   right hemicolectomy, no residual colon cancer on path   COLONOSCOPY  07/16/2010   DOQ:YBEZMEOJDUPR POLYP-TCS 3 YEARS   COLONOSCOPY N/A 08/02/2013   hyperplastic polyps, surveillance in 2020 if benefits outweight the risks   COLONOSCOPY  06/2009   1.2 cm sessile cecal polyp which had adenocarcinoma arising in a tubular adenoma.   PARTIAL THYMECTOMY     partial thyroidectomy     benign tumors   Family History  Problem Relation Age of Onset   Colon cancer Mother        >age62   Arthritis Mother    Cancer Mother    Heart disease Mother    Hyperlipidemia Mother    Hypertension Mother    Heart attack Father    Heart disease Father    Diabetes Maternal Aunt  Hyperlipidemia Daughter    Hypertension Daughter    Liver disease Neg Hx    Social History   Socioeconomic History   Marital status: Widowed    Spouse name: Not on file   Number of children: 2   Years of education: Not on file   Highest education level: Not on file  Occupational History   Occupation: Agricultural consultant at [employer]    Employer: RETIRED   Occupation: retired from Designer, fashion/clothing  Tobacco Use   Smoking status: Never   Smokeless tobacco: Never  Vaping Use   Vaping status: Never Used  Substance and Sexual Activity   Alcohol use: No    Alcohol/week: 0.0 standard drinks of alcohol   Drug use: No   Sexual activity: Not Currently  Other Topics Concern   Not on file  Social History Narrative   Not on file   Social Drivers of Health   Financial Resource Strain: Low Risk  (12/05/2023)   Overall Financial Resource Strain (CARDIA)    Difficulty of Paying Living Expenses: Not hard at all  Food Insecurity: No Food Insecurity (12/05/2023)   Hunger Vital Sign    Worried About Running Out of Food in the Last Year: Never true    Ran Out of Food in the Last Year: Never true  Transportation Needs: No  Transportation Needs (12/05/2023)   PRAPARE - Administrator, Civil Service (Medical): No    Lack of Transportation (Non-Medical): No  Physical Activity: Inactive (12/05/2023)   Exercise Vital Sign    Days of Exercise per Week: 0 days    Minutes of Exercise per Session: 0 min  Stress: No Stress Concern Present (12/05/2023)   [employer] of Occupational Health - Occupational Stress Questionnaire    Feeling of Stress: Not at all  Social Connections: Moderately Isolated (12/05/2023)   Social Connection and Isolation Panel    Frequency of Communication with Friends and Family: More than three times a week    Frequency of Social Gatherings with Friends and Family: More than three times a week    Attends Religious Services: More than 4 times per year    Active Member of [employer] or Organizations: No    Attends Banker Meetings: Never    Marital Status: Widowed    Tobacco Counseling Counseling given: Yes    Clinical Intake:  Pre-visit preparation completed: Yes  Pain : No/denies pain     BMI - recorded: 26.22 Nutritional Status: BMI 25 -29 Overweight Nutritional Risks: None Diabetes: No  Lab Results  Component Value Date   HGBA1C 6.3 (H) 03/27/2023   HGBA1C 6.1 (H) 07/21/2022   HGBA1C 6.0 (H) 02/07/2022     How often do you need to have someone help you when you read instructions, pamphlets, or other written materials from your doctor or pharmacy?: 1 - Never  Interpreter Needed?: No  Information entered by :: Luken Shadowens W CMA (AAMA)   Activities of Daily Living     12/05/2023   10:44 AM  In your present state of health, do you have any difficulty performing the following activities:  Hearing? 0  Vision? 0  Difficulty concentrating or making decisions? 1  Walking or climbing stairs? 1  Dressing or bathing? 0  Doing errands, shopping? 1  Preparing Food and eating ? Y  Using the Toilet? N  In the past six months, have you accidently leaked  urine? N  Do you have problems with loss of bowel control? N  Managing  your Medications? Y  Managing your Finances? Y  Housekeeping or managing your Housekeeping? Y    Patient Care Team: Tobie Suzzane POUR, MD as PCP - General (Internal Medicine) Alvan Dorn FALCON, MD as Consulting Physician (Cardiology) Gregg Lek, MD as Consulting Physician (Neurology) Rachele Gaynell RAMAN, MD as Consulting Physician (Nephrology) Darroll Anes, DO (Optometry) Alvia Norleen BIRCH, MD as Consulting Physician (Ophthalmology)  I have updated your Care Teams any recent Medical Services you may have received from other providers in the past year.     Assessment:   This is a routine wellness examination for Tyanne.  Hearing/Vision screen Hearing Screening - Comments:: Patient denies any hearing difficulties.   Vision Screening - Comments:: Wears rx glasses - up to date with routine eye exams with  Anes Darroll at [facility] location   Goals Addressed               This Visit's Progress     Patient Stated   On track     Do stretching exercises daily       Patient Stated (pt-stated)   On track     To stay healthy and active        Depression Screen     12/05/2023   10:38 AM 07/24/2023    2:56 PM 06/22/2023    3:02 PM 04/19/2023    4:02 PM 01/23/2023    3:02 PM 11/30/2022   10:48 AM 11/09/2022   10:43 AM  PHQ 2/9 Scores  PHQ - 2 Score 0 0 0 0 0 0 0  PHQ- 9 Score 1 2 0  3 3 2      Fall Risk     12/05/2023   10:42 AM 07/24/2023    2:55 PM 06/22/2023    3:02 PM 04/19/2023    4:01 PM 01/23/2023    3:02 PM  Fall Risk   Falls in the past year? 0 0 0 0 0  Number falls in past yr: 0 0 0 0 0  Injury with Fall? 0 0 0 0 0  Risk for fall due to : Impaired balance/gait;Impaired mobility No Fall Risks No Fall Risks No Fall Risks No Fall Risks  Follow up Falls evaluation completed;Education provided;Falls prevention discussed Falls evaluation completed;Education provided;Falls  prevention discussed Falls evaluation completed Falls evaluation completed Falls evaluation completed    MEDICARE RISK AT HOME:  Medicare Risk at Home Any stairs in or around the home?: No If so, are there any without handrails?: No Home free of loose throw rugs in walkways, pet beds, electrical cords, etc?: Yes Adequate lighting in your home to reduce risk of falls?: Yes Life alert?: No Use of a cane, walker or w/c?: Yes Grab bars in the bathroom?: Yes Shower chair or bench in shower?: Yes Elevated toilet seat or a handicapped toilet?: Yes  TIMED UP AND GO:  Was the test performed?  No  Cognitive Function: Unable: Due to language barrier, hearing or vision limitations or otherdiagnosis of cognitive impairement    12/05/2023   10:45 AM 11/16/2022    3:41 PM  MMSE - Mini Mental State Exam  Not completed: Unable to complete   Orientation to time  4  Orientation to Place  4  Registration  3  Attention/ Calculation  5  Recall  1  Language- name 2 objects  2  Language- repeat  1  Language- follow 3 step command  3  Language- read & follow direction  1  Write  a sentence  1  Copy design  1  Total score  26        11/30/2022   10:42 AM 11/22/2021    3:14 PM 11/13/2020    2:39 PM  6CIT Screen  What Year? 0 points 0 points 0 points  What month? 0 points 0 points 0 points  What time? 0 points 0 points 0 points  Count back from 20 0 points 0 points 0 points  Months in reverse 4 points 4 points 2 points  Repeat phrase 8 points 4 points 4 points  Total Score 12 points 8 points 6 points    Immunizations Immunization History  Administered Date(s) Administered   Fluad Quad(high Dose 65+) 11/16/2018, 12/18/2019, 12/15/2020, 01/20/2022   Fluad Trivalent(High Dose 65+) 01/23/2023   INFLUENZA, HIGH DOSE SEASONAL PF 12/14/2017   Influenza,inj,Quad PF,6+ Mos 12/15/2015   Moderna SARS-COV2 Booster Vaccination 09/15/2020   Moderna Sars-Covid-2 Vaccination 04/20/2019, 05/21/2019,  02/07/2022   PFIZER(Purple Top)SARS-COV-2 Vaccination 02/10/2020   PNEUMOCOCCAL CONJUGATE-20 01/13/2021   Zoster, Live 06/18/2014    Screening Tests Health Maintenance  Topic Date Due   DTaP/Tdap/Td (1 - Tdap) Never done   Zoster Vaccines- Shingrix (1 of 2) 11/23/1953   Influenza Vaccine  10/13/2023   COVID-19 Vaccine (5 - 2025-26 season) 11/13/2023   Medicare Annual Wellness (AWV)  12/04/2024   Pneumococcal Vaccine: 50+ Years  Completed   DEXA SCAN  Completed   HPV VACCINES  Aged Out   Meningococcal B Vaccine  Aged Out    Health Maintenance Health Maintenance Due  Topic Date Due   DTaP/Tdap/Td (1 - Tdap) Never done   Zoster Vaccines- Shingrix (1 of 2) 11/23/1953   Influenza Vaccine  10/13/2023   COVID-19 Vaccine (5 - 2025-26 season) 11/13/2023   Health Maintenance Items Addressed: Vaccines discussed.   Additional Screening:  Vision Screening: Recommended annual ophthalmology exams for early detection of glaucoma and other disorders of the eye. Would you like a referral to an eye doctor? No    Dental Screening: Recommended annual dental exams for proper oral hygiene  Community Resource Referral / Chronic Care Management: CRR required this visit?  No   CCM required this visit?  No   Plan:    I have personally reviewed and noted the following in the patient's chart:   Medical and social history Use of alcohol, tobacco or illicit drugs  Current medications and supplements including opioid prescriptions. Patient is not currently taking opioid prescriptions. Functional ability and status Nutritional status Physical activity Advanced directives List of other physicians Hospitalizations, surgeries, and ER visits in previous 12 months Vitals Screenings to include cognitive, depression, and falls Referrals and appointments  In addition, I have reviewed and discussed with patient certain preventive protocols, quality metrics, and best practice recommendations. A  written personalized care plan for preventive services as well as general preventive health recommendations were provided to patient.   Daryll Spisak, CMA   12/05/2023   After Visit Summary: (MyChart) Due to this being a telephonic visit, the after visit summary with patients personalized plan was offered to patient via MyChart   Notes: Nothing significant to report at this time.

## 2023-12-11 DIAGNOSIS — N179 Acute kidney failure, unspecified: Secondary | ICD-10-CM | POA: Diagnosis not present

## 2023-12-11 DIAGNOSIS — N184 Chronic kidney disease, stage 4 (severe): Secondary | ICD-10-CM | POA: Diagnosis not present

## 2023-12-11 DIAGNOSIS — D7589 Other specified diseases of blood and blood-forming organs: Secondary | ICD-10-CM | POA: Diagnosis not present

## 2023-12-11 DIAGNOSIS — I5032 Chronic diastolic (congestive) heart failure: Secondary | ICD-10-CM | POA: Diagnosis not present

## 2023-12-25 DIAGNOSIS — N179 Acute kidney failure, unspecified: Secondary | ICD-10-CM | POA: Diagnosis not present

## 2023-12-25 DIAGNOSIS — H25813 Combined forms of age-related cataract, bilateral: Secondary | ICD-10-CM | POA: Diagnosis not present

## 2023-12-25 DIAGNOSIS — R809 Proteinuria, unspecified: Secondary | ICD-10-CM | POA: Diagnosis not present

## 2023-12-25 DIAGNOSIS — D631 Anemia in chronic kidney disease: Secondary | ICD-10-CM | POA: Diagnosis not present

## 2023-12-25 DIAGNOSIS — N189 Chronic kidney disease, unspecified: Secondary | ICD-10-CM | POA: Diagnosis not present

## 2023-12-25 DIAGNOSIS — R319 Hematuria, unspecified: Secondary | ICD-10-CM | POA: Diagnosis not present

## 2024-01-03 ENCOUNTER — Encounter (INDEPENDENT_AMBULATORY_CARE_PROVIDER_SITE_OTHER): Admitting: Ophthalmology

## 2024-01-03 DIAGNOSIS — H34831 Tributary (branch) retinal vein occlusion, right eye, with macular edema: Secondary | ICD-10-CM

## 2024-01-03 DIAGNOSIS — H43813 Vitreous degeneration, bilateral: Secondary | ICD-10-CM

## 2024-01-03 DIAGNOSIS — H35033 Hypertensive retinopathy, bilateral: Secondary | ICD-10-CM | POA: Diagnosis not present

## 2024-01-03 DIAGNOSIS — I10 Essential (primary) hypertension: Secondary | ICD-10-CM

## 2024-01-04 NOTE — Progress Notes (Signed)
 " [facility]   [phone] Fax:([phone]   Clinic New Consult Note   Patient Care Team: Tobie Suzzane POUR, MD as PCP - General (Internal Medicine) Alvan, Dorn FALCON, MD as Consulting Physician (Cardiology) Gregg Lek, MD as Consulting Physician (Neurology) Rachele Gaynell RAMAN, MD as Consulting Physician (Nephrology) Darroll Anes, DO (Optometry) Alvia Norleen BIRCH, MD as Consulting Physician (Ophthalmology) 01/05/2024  CHIEF COMPLAINTS/PURPOSE OF CONSULTATION:  Macrocytosis   REFERRING PHYSICIAN: Rachele Gaynell RAMAN, MD   Discussed the use of AI scribe software for clinical note transcription with the patient, who gave verbal consent to proceed.  History of Present Illness [name] is an 89 year old female who presents with macrocytosis. She is accompanied by her daughter, [name]. She was referred by her kidney doctor, Dr. Rachele, for evaluation of macrocytosis.  Macrocytosis has been present for the past year. Hemoglobin is slightly lower than last year but remains within the normal range. White blood cell and platelet counts are normal. She feels cold, which she attributes to blood thinners.  She has chronic kidney disease, classified as C3B CKD for several years, with recent tests showing improved kidney function and normalized potassium levels. She is prediabetic and has controlled high blood pressure without specific medication. 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 She had recurrent episodes of PE, is currently on Eliquis .  She also had remote history of colon cancer, she was last seen by our NP in office in April 2025 for the above follow-up.  Her surgical history includes thyroid  surgery, partial colectomy for early-stage colon cancer, and a hysterectomy. She did not require chemotherapy or radiation for colon cancer.  She has atrial fibrillation and is on Eliquis  following a mini-stroke and  pulmonary embolism. Her medications include gabapentin , pantoprazole , metoprolol , and Cardizem . She lives independently with assistance from her daughter, [name], and no longer drives due to past vertigo. No alcohol or tobacco use.     MEDICAL HISTORY:  Past Medical History:  Diagnosis Date   Allergy    Arthritis    Colon cancer (HCC)    colon ca dx 07/30/09   History of cardiac monitoring 07/2017   Event monitor demonstrated sinus rhythm with isolated PACs and no arrhythmias   History of colon cancer 06/2009   found at time of TCS 06/29/09, 1.2cm sessile cecal polyp, no adjuvent therapy needed   HTN (hypertension)    Hx of cardiovascular stress test 07/2017   No diagnostic ST segment changes to indicate ischemia. Small, moderate intensity, reversible apical to basal inferolateral defect consistent with ischemia. This is a low risk study. Nuclear stress EF: 84%.   Hyperlipidemia    Hypothyroidism    PE (pulmonary thromboembolism) (HCC)    Renal disorder    cyst on kidney    Silent micro-hemorrhage of brain (HCC) 11/25/2018   Stroke [facility])    TIA (transient ischemic attack) 11/25/2018   Vertigo     SURGICAL HISTORY: Past Surgical History:  Procedure Laterality Date   ABDOMINAL HYSTERECTOMY     COLON SURGERY  07/2009   right hemicolectomy, no residual colon cancer on path   COLONOSCOPY  07/16/2010   DOQ:YBEZMEOJDUPR POLYP-TCS 3 YEARS   COLONOSCOPY N/A 08/02/2013   hyperplastic polyps, surveillance in 2020 if benefits outweight the risks   COLONOSCOPY  06/2009   1.2 cm sessile cecal polyp which had adenocarcinoma arising in a tubular adenoma.   PARTIAL THYMECTOMY     partial  thyroidectomy     benign tumors    SOCIAL HISTORY: Social History   Socioeconomic History   Marital status: Widowed    Spouse name: Not on file   Number of children: 2   Years of education: Not on file   Highest education level: Not on file  Occupational History   Occupation: agricultural consultant at [employer]     Employer: RETIRED   Occupation: retired from designer, fashion/clothing  Tobacco Use   Smoking status: Never   Smokeless tobacco: Never  Vaping Use   Vaping status: Never Used  Substance and Sexual Activity   Alcohol use: No    Alcohol/week: 0.0 standard drinks of alcohol   Drug use: No   Sexual activity: Not Currently  Other Topics Concern   Not on file  Social History Narrative   Not on file   Social Drivers of Health   Financial Resource Strain: Low Risk  (12/05/2023)   Overall Financial Resource Strain (CARDIA)    Difficulty of Paying Living Expenses: Not hard at all  Food Insecurity: No Food Insecurity (01/05/2024)   Hunger Vital Sign    Worried About Running Out of Food in the Last Year: Never true    Ran Out of Food in the Last Year: Never true  Transportation Needs: No Transportation Needs (12/05/2023)   PRAPARE - Administrator, Civil Service (Medical): No    Lack of Transportation (Non-Medical): No  Physical Activity: Inactive (12/05/2023)   Exercise Vital Sign    Days of Exercise per Week: 0 days    Minutes of Exercise per Session: 0 min  Stress: No Stress Concern Present (12/05/2023)   [employer] of Occupational Health - Occupational Stress Questionnaire    Feeling of Stress: Not at all  Social Connections: Moderately Isolated (12/05/2023)   Social Connection and Isolation Panel    Frequency of Communication with Friends and Family: More than three times a week    Frequency of Social Gatherings with Friends and Family: More than three times a week    Attends Religious Services: More than 4 times per year    Active Member of [employer] or Organizations: No    Attends Banker Meetings: Never    Marital Status: Widowed  Intimate Partner Violence: Not At Risk (01/05/2024)   Humiliation, Afraid, Rape, and Kick questionnaire    Fear of Current or Ex-Partner: No    Emotionally Abused: No    Physically Abused: No    Sexually Abused: No    FAMILY  HISTORY: Family History  Problem Relation Age of Onset   Colon cancer Mother        >[id number]   Arthritis Mother    Cancer Mother        unknown type   Heart disease Mother    Hyperlipidemia Mother    Hypertension Mother    Heart attack Father    Heart disease Father    Diabetes Maternal Aunt    Hyperlipidemia Daughter    Hypertension Daughter    Liver disease Neg Hx     ALLERGIES:  is allergic to tape.  MEDICATIONS:  Current Outpatient Medications  Medication Sig Dispense Refill   acetaminophen  (TYLENOL ) 500 MG tablet Take 500 mg by mouth every 8 (eight) hours as needed for mild pain or headache.     apixaban  (ELIQUIS ) 2.5 MG TABS tablet Take 1 tablet (2.5 mg total) by mouth 2 (two) times daily. 60 tablet 11   ascorbic acid  (VITAMIN C ) 250  MG CHEW Chew 250 mg by mouth daily.     ASPERCREME LIDOCAINE  EX Apply 1 application topically daily as needed (for knee pain).      chlorthalidone (HYGROTON) 25 MG tablet Take 12.5 mg by mouth daily.     cholecalciferol  (VITAMIN D3) 25 MCG (1000 UT) tablet Take 1,000 Units by mouth daily.      diltiazem  (CARDIZEM  CD) 180 MG 24 hr capsule TAKE ONE CAPSULE BY MOUTH EVERY DAY 90 capsule 3   diltiazem  (CARDIZEM ) 30 MG tablet Take 1 tablet (30 mg total) by mouth every 8 (eight) hours as needed (Palpitations). 90 tablet 2   ezetimibe  (ZETIA ) 10 MG tablet TAKE (1) TABLET BY MOUTH ONCE DAILY. 90 tablet 3   fluticasone  (FLONASE ) 50 MCG/ACT nasal spray Place 2 sprays into both nostrils daily. 16 g 1   gabapentin  (NEURONTIN ) 300 MG capsule TAKE ONE CAPSULE BY MOUTH TWICE DAILY 60 capsule 4   loratadine (CLARITIN) 10 MG tablet Take 10 mg by mouth daily.      lubiprostone  (AMITIZA ) 24 MCG capsule TAKE ONE CAPSULE BY MOUTH DAILY WITH BREAKFAST 30 capsule 2   meclizine  (ANTIVERT ) 25 MG tablet Take 1 tablet (25 mg total) by mouth 2 (two) times daily as needed for dizziness. 30 tablet 0   metoprolol  succinate (TOPROL -XL) 100 MG 24 hr tablet Take 1 tablet (100 mg  total) by mouth in the morning and at bedtime. 180 tablet 3   pantoprazole  (PROTONIX ) 40 MG tablet TAKE ONE TABLET BY MOUTH EVERY DAY 30 tablet 1   RESTASIS  0.05 % ophthalmic emulsion 1 drop 2 (two) times daily.     rosuvastatin  (CRESTOR ) 40 MG tablet TAKE ONE TABLET BY MOUTH ONCE DAILY. 90 tablet 2   topiramate  (TOPAMAX ) 50 MG tablet TAKE ONE TABLET BY MOUTH TWICE DAILY 90 tablet 6   traMADol  (ULTRAM ) 50 MG tablet Take 0.5-1 tablets (25-50 mg total) by mouth every 12 (twelve) hours as needed for severe pain. 15 tablet 0   UNABLE TO FIND Flutter value- acapella device- use as instructed 1 each 0   UNABLE TO FIND 1 each by Does not apply route daily. Med Name: Blood pressure cuff 1 each 0   No current facility-administered medications for this visit.    REVIEW OF SYSTEMS:   Constitutional: Denies fevers, chills or abnormal night sweats, (+) fatigue but still functions  Eyes: Denies blurriness of vision, double vision or watery eyes Ears, nose, mouth, throat, and face: Denies mucositis or sore throat Respiratory: Denies cough, dyspnea or wheezes Cardiovascular: Denies palpitation, chest discomfort or lower extremity swelling Gastrointestinal:  Denies nausea, heartburn or change in bowel habits Skin: Denies abnormal skin rashes Lymphatics: Denies new lymphadenopathy or easy bruising Neurological:Denies numbness, tingling or new weaknesses Behavioral/Psych: Mood is stable, no new changes  All other systems were reviewed with the patient and are negative.  PHYSICAL EXAMINATION: ECOG PERFORMANCE STATUS: 1 - Symptomatic but completely ambulatory  Vitals:   01/05/24 1503  BP: (!) 120/58  Pulse: (!) 58  Resp: 18  Temp: 98.7 F (37.1 C)   Filed Weights   01/05/24 1503  Weight: 148 lb 5.9 oz (67.3 kg)    GENERAL:alert, no distress and comfortable SKIN: skin color, texture, turgor are normal, no rashes or significant lesions EYES: normal, conjunctiva are pink and non-injected, sclera  clear OROPHARYNX:no exudate, no erythema and lips, buccal mucosa, and tongue normal  NECK: supple, thyroid  normal size, non-tender, without nodularity LYMPH:  no palpable lymphadenopathy in the cervical, axillary  or inguinal LUNGS: clear to auscultation and percussion with normal breathing effort HEART: regular rate & rhythm and no murmurs and no lower extremity edema ABDOMEN:abdomen soft, non-tender and normal bowel sounds Musculoskeletal:no cyanosis of digits and no clubbing  PSYCH: alert & oriented x 3 with fluent speech NEURO: no focal motor/sensory deficits  Physical Exam   LABORATORY DATA:  I have reviewed the data as listed    Latest Ref Rng & Units 06/19/2023    2:56 PM 12/12/2022    2:49 PM 10/17/2022    2:44 PM  CBC  WBC 4.0 - 10.5 K/uL 4.4  3.9  6.4   Hemoglobin 12.0 - 15.0 g/dL 87.3  87.2  87.9   Hematocrit 36.0 - 46.0 % 39.6  39.8  38.1   Platelets 150 - 400 K/uL 217  195  294       Latest Ref Rng & Units 06/19/2023    2:56 PM 12/12/2022    2:49 PM 10/17/2022    2:44 PM  CMP  Glucose 70 - 99 mg/dL 889  883  71   BUN 8 - 23 mg/dL 21  20  23    Creatinine 0.44 - 1.00 mg/dL 8.23  8.31  8.14   Sodium 135 - 145 mmol/L 141  141  146   Potassium 3.5 - 5.1 mmol/L 3.5  3.3  4.1   Chloride 98 - 111 mmol/L 108  109  109   CO2 22 - 32 mmol/L 24  24  26    Calcium  8.9 - 10.3 mg/dL 9.1  9.1  8.6   Total Protein 6.5 - 8.1 g/dL 7.3  7.0    Total Bilirubin 0.0 - 1.2 mg/dL 0.6  0.5    Alkaline Phos 38 - 126 U/L 79  78    AST 15 - 41 U/L 29  40    ALT 0 - 44 U/L 38  50       RADIOGRAPHIC STUDIES: I have personally reviewed the radiological images as listed and agreed with the findings in the report. No results found.   Assessment & Plan Macrocytosis without anemia Macrocytosis with increased red blood cell size over the past year. Hemoglobin levels are normal, but slightly trending down from 14 to 12. White blood cell and platelet counts are normal. B12 and folate levels are  normal, ruling out nutritional deficiency. Liver function is normal, and there is no alcohol use or liver disease. Thyroid  function tests show subclinical hyperthyroidism, but free T4 is normal.  -other differential diagnosis includes primary bone marrow disease such as MDS which requires bone marrow biopsy.  Age and medical comorbidities, and no high suspicion for malignant heme disease, I will hold bone marrow biopsy for now.  But if she develops worsening anemia, or other cytopenia, bone marrow biopsy may be warranted.  Discussed with her daughter in detail, she is in agreement. - Monitor condition with follow-up in 3-4 months to repeat lab tests and assess blood counts. - Administer flu shot today.  History of recurrent PE - On Eliquis , will continue indefinitely if no contraindications  Plan - I have reviewed her lab results from our system and recent lab from her nephrologist lab in May and September 2025, no need additional labs today. - Follow-up with NP in 4 months.   No orders of the defined types were placed in this encounter.   All questions were answered. The patient knows to call the clinic with any problems, questions or concerns. I spent 25  minutes counseling the patient face to face. The total time spent in the appointment was 30 minutes including review of chart and various tests results, discussions about plan of care and coordination of care plan.     Onita Mattock, MD 01/05/2024 3:35 PM    "

## 2024-01-05 ENCOUNTER — Inpatient Hospital Stay

## 2024-01-05 ENCOUNTER — Inpatient Hospital Stay: Attending: Hematology | Admitting: Hematology

## 2024-01-05 ENCOUNTER — Encounter: Payer: Self-pay | Admitting: Hematology

## 2024-01-05 VITALS — BP 120/58 | HR 58 | Temp 98.7°F | Resp 18 | Ht 61.42 in | Wt 148.4 lb

## 2024-01-05 DIAGNOSIS — Z86711 Personal history of pulmonary embolism: Secondary | ICD-10-CM | POA: Insufficient documentation

## 2024-01-05 DIAGNOSIS — I129 Hypertensive chronic kidney disease with stage 1 through stage 4 chronic kidney disease, or unspecified chronic kidney disease: Secondary | ICD-10-CM | POA: Diagnosis not present

## 2024-01-05 DIAGNOSIS — Z8673 Personal history of transient ischemic attack (TIA), and cerebral infarction without residual deficits: Secondary | ICD-10-CM | POA: Diagnosis not present

## 2024-01-05 DIAGNOSIS — Z79899 Other long term (current) drug therapy: Secondary | ICD-10-CM | POA: Diagnosis not present

## 2024-01-05 DIAGNOSIS — R7303 Prediabetes: Secondary | ICD-10-CM | POA: Diagnosis not present

## 2024-01-05 DIAGNOSIS — Z7901 Long term (current) use of anticoagulants: Secondary | ICD-10-CM | POA: Diagnosis not present

## 2024-01-05 DIAGNOSIS — Z23 Encounter for immunization: Secondary | ICD-10-CM | POA: Insufficient documentation

## 2024-01-05 DIAGNOSIS — Z800 Family history of malignant neoplasm of digestive organs: Secondary | ICD-10-CM | POA: Diagnosis not present

## 2024-01-05 DIAGNOSIS — D7589 Other specified diseases of blood and blood-forming organs: Secondary | ICD-10-CM | POA: Insufficient documentation

## 2024-01-05 DIAGNOSIS — I4891 Unspecified atrial fibrillation: Secondary | ICD-10-CM | POA: Insufficient documentation

## 2024-01-05 DIAGNOSIS — N1832 Chronic kidney disease, stage 3b: Secondary | ICD-10-CM | POA: Diagnosis not present

## 2024-01-05 DIAGNOSIS — Z85038 Personal history of other malignant neoplasm of large intestine: Secondary | ICD-10-CM | POA: Diagnosis not present

## 2024-01-05 DIAGNOSIS — Z809 Family history of malignant neoplasm, unspecified: Secondary | ICD-10-CM | POA: Insufficient documentation

## 2024-01-05 MED ORDER — INFLUENZA VAC SPLIT HIGH-DOSE 0.5 ML IM SUSY
0.5000 mL | PREFILLED_SYRINGE | INTRAMUSCULAR | Status: DC
Start: 2024-01-06 — End: 2024-01-05

## 2024-01-05 MED ORDER — INFLUENZA VAC SPLIT HIGH-DOSE 0.5 ML IM SUSY
0.5000 mL | PREFILLED_SYRINGE | Freq: Once | INTRAMUSCULAR | Status: AC
  Administered 2024-01-05: 0.5 mL via INTRAMUSCULAR

## 2024-01-05 NOTE — Progress Notes (Signed)
 Patient receiving flu vaccination today. Patient tolerated injection with no complaints voiced.  Site clean and dry with no bruising or swelling noted.  No complaints of pain.

## 2024-01-19 ENCOUNTER — Other Ambulatory Visit: Payer: Self-pay | Admitting: Internal Medicine

## 2024-01-25 ENCOUNTER — Encounter: Payer: Self-pay | Admitting: Internal Medicine

## 2024-01-25 ENCOUNTER — Ambulatory Visit: Admitting: Internal Medicine

## 2024-01-25 VITALS — BP 119/71 | HR 73 | Wt 147.8 lb

## 2024-01-25 DIAGNOSIS — E039 Hypothyroidism, unspecified: Secondary | ICD-10-CM

## 2024-01-25 DIAGNOSIS — Z0001 Encounter for general adult medical examination with abnormal findings: Secondary | ICD-10-CM | POA: Diagnosis not present

## 2024-01-25 DIAGNOSIS — G40209 Localization-related (focal) (partial) symptomatic epilepsy and epileptic syndromes with complex partial seizures, not intractable, without status epilepticus: Secondary | ICD-10-CM

## 2024-01-25 DIAGNOSIS — M19011 Primary osteoarthritis, right shoulder: Secondary | ICD-10-CM

## 2024-01-25 DIAGNOSIS — N184 Chronic kidney disease, stage 4 (severe): Secondary | ICD-10-CM | POA: Diagnosis not present

## 2024-01-25 DIAGNOSIS — I2699 Other pulmonary embolism without acute cor pulmonale: Secondary | ICD-10-CM

## 2024-01-25 DIAGNOSIS — I10 Essential (primary) hypertension: Secondary | ICD-10-CM | POA: Diagnosis not present

## 2024-01-25 DIAGNOSIS — M19012 Primary osteoarthritis, left shoulder: Secondary | ICD-10-CM

## 2024-01-25 DIAGNOSIS — H6123 Impacted cerumen, bilateral: Secondary | ICD-10-CM

## 2024-01-25 NOTE — Assessment & Plan Note (Addendum)
 Last CMP reviewed, stable GFR in 20s now F/u with Nephrology Avoid nephrotoxic agents No proteinuria currently according to Nephrology chart

## 2024-01-25 NOTE — Assessment & Plan Note (Signed)
 Bilateral shoulder pain likely due to OA of shoulder Reviewed x-ray of left shoulder from 2019, which showed degenerative changes of acromioclavicular joint Advised to take Tylenol  arthritis as needed for pain Can use Bengay or IcyHot as needed for pain Avoid oral NSAIDs due to history of CKD and anticoagulation If persistent or worsening pain, will refer to orthopedic surgery

## 2024-01-25 NOTE — Assessment & Plan Note (Signed)
 Now on Eliquis  2.5 mg BID - since 10/06/22 Had right heart strain and elevated BP - BP wnl today, appears euvolemic Had hematology evaluation-recommend indefinite AC with Eliquis 

## 2024-01-25 NOTE — Assessment & Plan Note (Addendum)
 BP Readings from Last 1 Encounters:  01/25/24 119/71   Well-controlled with Metoprolol  100 mg BID and Cardizem  180 mg QD On Chlorthalidone 12.5 mg QD Counseled for compliance with the medications Advised DASH diet and moderate exercise/walking as tolerated

## 2024-01-25 NOTE — Progress Notes (Signed)
 Established Patient Office Visit  Subjective:  Patient ID: [name], female    DOB: 11/19/1934  Age: 89 y.o. MRN: [id number]  CC:  Chief Complaint  Patient presents with   Annual Exam    cpe   Joint Pain    Pt reports pain in joints , shoulder  and wrist.    Ear Problem    Pt reports difficulty hearing.     HPI [name] is a 89 y.o. female with past medical history of HTN, prediabetes, CVA with residual left sided numbness and weakness, ?partial seizure, hypothyroidism, colon ca. s/p right hemicolectomy, chronic constipation, PE and CKD stage 4 who presents for annual physical.  HTN: BP is well-controlled. Takes medications regularly. Patient denies headache, chest pain, dyspnea or palpitations.  She has dizziness upon sudden change in position, especially while standing up. She reports dizziness while sitting as well and sways towards the side due to it. She has tried Meclizine  with mild relief. She has had allergic sinusitis, which also caused ear fullness, but has improved now since the last visit.  She had a ear irrigation with ENT specialist, which improved her hearing and dizziness.  She had hematology evaluation for PE.  She has been advised to take Eliquis  indefinitely due to recurrent PE.   Her leg swelling has improved with leg elevation and compression stockings.  She still has mild ankle swelling bilaterally, but is better with Chlorthalidone 12.5 mg now.  She follows up with Dr Rachele for CKD stage 4.  Last CMP showed GFR of 20.  Denies dysuria, hematuria or urinary hesitancy or resistance.  She follows up with neurology for history of CVA and seizures.  She currently takes topiramate  50 mg twice daily.  She has history of peripheral neuropathy, had glovelike sensation in her UE, and has been taking gabapentin  300 mg 2 times daily.  She reports bilateral shoulder pain, which is chronic and intermittent.  Her pain is worse with lifting any  object with the hand, and has to leave the object to get relief.  She was initially having wrist pain, which is resolved now.  She has peripheral neuropathy, but handgrip is intact currently.  Past Medical History:  Diagnosis Date   Allergy    Arthritis    Colon cancer (HCC)    colon ca dx 07/30/09   History of cardiac monitoring 07/2017   Event monitor demonstrated sinus rhythm with isolated PACs and no arrhythmias   History of colon cancer 06/2009   found at time of TCS 06/29/09, 1.2cm sessile cecal polyp, no adjuvent therapy needed   HTN (hypertension)    Hx of cardiovascular stress test 07/2017   No diagnostic ST segment changes to indicate ischemia. Small, moderate intensity, reversible apical to basal inferolateral defect consistent with ischemia. This is a low risk study. Nuclear stress EF: 84%.   Hyperlipidemia    Hypothyroidism    PE (pulmonary thromboembolism) (HCC)    Renal disorder    cyst on kidney    Silent micro-hemorrhage of brain (HCC) 11/25/2018   Stroke [facility])    TIA (transient ischemic attack) 11/25/2018   Vertigo     Past Surgical History:  Procedure Laterality Date   ABDOMINAL HYSTERECTOMY     COLON SURGERY  07/2009   right hemicolectomy, no residual colon cancer on path   COLONOSCOPY  07/16/2010   DOQ:YBEZMEOJDUPR POLYP-TCS 3 YEARS   COLONOSCOPY N/A 08/02/2013   hyperplastic polyps, surveillance in 2020 if benefits outweight  the risks   COLONOSCOPY  06/2009   1.2 cm sessile cecal polyp which had adenocarcinoma arising in a tubular adenoma.   PARTIAL THYMECTOMY     partial thyroidectomy     benign tumors    Family History  Problem Relation Age of Onset   Colon cancer Mother        >age79   Arthritis Mother    Cancer Mother        unknown type   Heart disease Mother    Hyperlipidemia Mother    Hypertension Mother    Heart attack Father    Heart disease Father    Diabetes Maternal Aunt    Hyperlipidemia Daughter    Hypertension Daughter     Liver disease Neg Hx     Social History   Socioeconomic History   Marital status: Widowed    Spouse name: Not on file   Number of children: 2   Years of education: Not on file   Highest education level: Not on file  Occupational History   Occupation: agricultural consultant at [employer]    Employer: RETIRED   Occupation: retired from designer, fashion/clothing  Tobacco Use   Smoking status: Never   Smokeless tobacco: Never  Vaping Use   Vaping status: Never Used  Substance and Sexual Activity   Alcohol use: No    Alcohol/week: 0.0 standard drinks of alcohol   Drug use: No   Sexual activity: Not Currently  Other Topics Concern   Not on file  Social History Narrative   Not on file   Social Drivers of Health   Financial Resource Strain: Low Risk  (12/05/2023)   Overall Financial Resource Strain (CARDIA)    Difficulty of Paying Living Expenses: Not hard at all  Food Insecurity: No Food Insecurity (01/05/2024)   Hunger Vital Sign    Worried About Running Out of Food in the Last Year: Never true    Ran Out of Food in the Last Year: Never true  Transportation Needs: No Transportation Needs (12/05/2023)   PRAPARE - Administrator, Civil Service (Medical): No    Lack of Transportation (Non-Medical): No  Physical Activity: Inactive (12/05/2023)   Exercise Vital Sign    Days of Exercise per Week: 0 days    Minutes of Exercise per Session: 0 min  Stress: No Stress Concern Present (12/05/2023)   [employer] of Occupational Health - Occupational Stress Questionnaire    Feeling of Stress: Not at all  Social Connections: Moderately Isolated (12/05/2023)   Social Connection and Isolation Panel    Frequency of Communication with Friends and Family: More than three times a week    Frequency of Social Gatherings with Friends and Family: More than three times a week    Attends Religious Services: More than 4 times per year    Active Member of [employer] or Organizations: No    Attends Banker  Meetings: Never    Marital Status: Widowed  Intimate Partner Violence: Not At Risk (01/05/2024)   Humiliation, Afraid, Rape, and Kick questionnaire    Fear of Current or Ex-Partner: No    Emotionally Abused: No    Physically Abused: No    Sexually Abused: No    Outpatient Medications Prior to Visit  Medication Sig Dispense Refill   acetaminophen  (TYLENOL ) 500 MG tablet Take 500 mg by mouth every 8 (eight) hours as needed for mild pain or headache.     apixaban  (ELIQUIS ) 2.5 MG TABS tablet Take 1  tablet (2.5 mg total) by mouth 2 (two) times daily. 60 tablet 11   ascorbic acid  (VITAMIN C ) 250 MG CHEW Chew 250 mg by mouth daily.     ASPERCREME LIDOCAINE  EX Apply 1 application topically daily as needed (for knee pain).      chlorthalidone (HYGROTON) 25 MG tablet Take 12.5 mg by mouth daily.     cholecalciferol  (VITAMIN D3) 25 MCG (1000 UT) tablet Take 1,000 Units by mouth daily.      diltiazem  (CARDIZEM  CD) 180 MG 24 hr capsule TAKE ONE CAPSULE BY MOUTH EVERY DAY 90 capsule 3   diltiazem  (CARDIZEM ) 30 MG tablet Take 1 tablet (30 mg total) by mouth every 8 (eight) hours as needed (Palpitations). 90 tablet 2   ezetimibe  (ZETIA ) 10 MG tablet TAKE (1) TABLET BY MOUTH ONCE DAILY. 90 tablet 3   fluticasone  (FLONASE ) 50 MCG/ACT nasal spray Place 2 sprays into both nostrils daily. 16 g 1   gabapentin  (NEURONTIN ) 300 MG capsule TAKE ONE CAPSULE BY MOUTH TWICE DAILY 60 capsule 4   loratadine (CLARITIN) 10 MG tablet Take 10 mg by mouth daily.      lubiprostone  (AMITIZA ) 24 MCG capsule TAKE ONE CAPSULE BY MOUTH DAILY WITH BREAKFAST 30 capsule 2   meclizine  (ANTIVERT ) 25 MG tablet Take 1 tablet (25 mg total) by mouth 2 (two) times daily as needed for dizziness. 30 tablet 0   metoprolol  succinate (TOPROL -XL) 100 MG 24 hr tablet Take 1 tablet (100 mg total) by mouth in the morning and at bedtime. 180 tablet 3   pantoprazole  (PROTONIX ) 40 MG tablet TAKE ONE TABLET BY MOUTH EVERY DAY 30 tablet 1   RESTASIS   0.05 % ophthalmic emulsion 1 drop 2 (two) times daily.     rosuvastatin  (CRESTOR ) 40 MG tablet TAKE ONE TABLET BY MOUTH ONCE DAILY. 90 tablet 2   topiramate  (TOPAMAX ) 50 MG tablet TAKE ONE TABLET BY MOUTH TWICE DAILY 90 tablet 6   traMADol  (ULTRAM ) 50 MG tablet Take 0.5-1 tablets (25-50 mg total) by mouth every 12 (twelve) hours as needed for severe pain. 15 tablet 0   UNABLE TO FIND Flutter value- acapella device- use as instructed 1 each 0   UNABLE TO FIND 1 each by Does not apply route daily. Med Name: Blood pressure cuff 1 each 0   No facility-administered medications prior to visit.    Allergies  Allergen Reactions   Tape Rash    Zio monitor adhesive causes ulcerated and infected skin .Had to see derm    ROS Review of Systems  Constitutional:  Negative for chills and fever.  HENT:  Positive for hearing loss. Negative for congestion and sore throat.   Eyes:  Negative for pain and discharge.  Respiratory:  Negative for cough and shortness of breath.   Cardiovascular:  Positive for leg swelling. Negative for chest pain and palpitations.  Gastrointestinal:  Positive for constipation. Negative for abdominal pain, diarrhea, nausea and vomiting.  Endocrine: Negative for polydipsia and polyuria.  Genitourinary:  Negative for dysuria and hematuria.  Musculoskeletal:  Positive for arthralgias (L knee). Negative for neck pain and neck stiffness.  Skin:  Negative for rash.  Neurological:  Positive for dizziness. Negative for weakness.  Psychiatric/Behavioral:  Negative for agitation and behavioral problems.       Objective:    Physical Exam Vitals reviewed.  Constitutional:      General: She is not in acute distress.    Appearance: She is not diaphoretic.  HENT:  Right Ear: Ear canal normal.     Left Ear: Ear canal normal. There is impacted cerumen.     Mouth/Throat:     Mouth: Mucous membranes are moist.     Pharynx: No posterior oropharyngeal erythema.  Eyes:     General:  No scleral icterus.    Extraocular Movements: Extraocular movements intact.  Cardiovascular:     Rate and Rhythm: Normal rate and regular rhythm.     Heart sounds: Normal heart sounds. No murmur heard. Pulmonary:     Breath sounds: Normal breath sounds. No wheezing or rales.  Abdominal:     Palpations: Abdomen is soft.     Tenderness: There is no abdominal tenderness.  Musculoskeletal:     Right shoulder: Tenderness (Mild) present. Normal range of motion.     Left shoulder: Tenderness (Mild) present. Normal range of motion.     Cervical back: Neck supple. No tenderness.     Left knee: Swelling present. Decreased range of motion. Tenderness present over the medial joint line.     Right lower leg: No edema.     Left lower leg: No edema.  Skin:    General: Skin is warm.     Findings: No rash.  Neurological:     General: No focal deficit present.     Mental Status: She is alert and oriented to person, place, and time.     Sensory: No sensory deficit.     Motor: Weakness (B/l LE - muscle strength 4/5) present.  Psychiatric:        Mood and Affect: Mood normal.        Behavior: Behavior normal.     BP 119/71   Pulse 73   Wt 147 lb 12.8 oz (67 kg)   SpO2 98%   BMI 27.55 kg/m  Wt Readings from Last 3 Encounters:  01/25/24 147 lb 12.8 oz (67 kg)  01/05/24 148 lb 5.9 oz (67.3 kg)  12/05/23 148 lb (67.1 kg)    Lab Results  Component Value Date   TSH 0.694 03/27/2023   Lab Results  Component Value Date   WBC 4.4 06/19/2023   HGB 12.6 06/19/2023   HCT 39.6 06/19/2023   MCV 102.9 (H) 06/19/2023   PLT 217 06/19/2023   Lab Results  Component Value Date   NA 141 06/19/2023   K 3.5 06/19/2023   CHLORIDE 105 04/16/2015   CO2 24 06/19/2023   GLUCOSE 110 (H) 06/19/2023   BUN 21 06/19/2023   CREATININE 1.76 (H) 06/19/2023   BILITOT 0.6 06/19/2023   ALKPHOS 79 06/19/2023   AST 29 06/19/2023   ALT 38 06/19/2023   PROT 7.3 06/19/2023   ALBUMIN 3.7 06/19/2023   CALCIUM  9.1  06/19/2023   ANIONGAP 9 06/19/2023   EGFR 26 (L) 10/17/2022   Lab Results  Component Value Date   CHOL 133 03/27/2023   Lab Results  Component Value Date   HDL 55 03/27/2023   Lab Results  Component Value Date   LDLCALC 66 03/27/2023   Lab Results  Component Value Date   TRIG 55 03/27/2023   Lab Results  Component Value Date   CHOLHDL 2.4 03/27/2023   Lab Results  Component Value Date   HGBA1C 6.3 (H) 03/27/2023      Assessment & Plan:   Problem List Items Addressed This Visit       Cardiovascular and Mediastinum   Essential hypertension   BP Readings from Last 1 Encounters:  01/25/24 119/71  Well-controlled with Metoprolol  100 mg BID and Cardizem  180 mg QD On Chlorthalidone 12.5 mg QD Counseled for compliance with the medications Advised DASH diet and moderate exercise/walking as tolerated      Pulmonary embolism (HCC)   Now on Eliquis  2.5 mg BID - since 10/06/22 Had right heart strain and elevated BP - BP wnl today, appears euvolemic Had hematology evaluation-recommend indefinite AC with Eliquis         Endocrine   Hypothyroidism   Lab Results  Component Value Date   TSH 0.694 03/27/2023   DCed Levothyroxine  25 mcg in the previous visit as TSH was low, later normalized without Levothyroxine  No change in appetite, weight, tremors or palpitations Check TSH and free T4 later        Nervous and Auditory   Impacted cerumen of both ears   Has had ear irrigation with ENT specialist Has recurrent ear wax impaction Advised to use Debrox as needed Avoid using sharp objects for cleaning purposes        Musculoskeletal and Integument   Arthritis of both shoulders   Bilateral shoulder pain likely due to OA of shoulder Reviewed x-ray of left shoulder from 2019, which showed degenerative changes of acromioclavicular joint Advised to take Tylenol  arthritis as needed for pain Can use Bengay or IcyHot as needed for pain Avoid oral NSAIDs due to history  of CKD and anticoagulation If persistent or worsening pain, will refer to orthopedic surgery        Genitourinary   Chronic kidney disease, stage 4 (severe) (HCC)   Last CMP reviewed, stable GFR in 20s now F/u with Nephrology Avoid nephrotoxic agents No proteinuria currently according to Nephrology chart        Other   Complex partial seizure (HCC)   Denies any recent seizure-like activity Followed by [facility] neurology On Topamax  50 mg BID      Encounter for general adult medical examination with abnormal findings - Primary   Physical exam as documented. Fasting blood tests reviewed (ordered by Nephrology). Advised to get Tdap vaccine at local pharmacy.        No orders of the defined types were placed in this encounter.   Follow-up: Return in about 6 months (around 07/24/2024) for HTN and CVA.    Suzzane MARLA Blanch, MD

## 2024-01-25 NOTE — Assessment & Plan Note (Signed)
 Has had ear irrigation with ENT specialist Has recurrent ear wax impaction Advised to use Debrox as needed Avoid using sharp objects for cleaning purposes

## 2024-01-25 NOTE — Assessment & Plan Note (Addendum)
 Physical exam as documented. Fasting blood tests reviewed (ordered by Nephrology). Advised to get Tdap vaccine at local pharmacy.

## 2024-01-25 NOTE — Assessment & Plan Note (Signed)
 Denies any recent seizure-like activity Followed by [facility] neurology On Topamax  50 mg BID

## 2024-01-25 NOTE — Assessment & Plan Note (Addendum)
 Lab Results  Component Value Date   TSH 0.694 03/27/2023   DCed Levothyroxine  25 mcg in the previous visit as TSH was low, later normalized without Levothyroxine  No change in appetite, weight, tremors or palpitations Check TSH and free T4 later

## 2024-01-25 NOTE — Patient Instructions (Addendum)
 Please take Tylenol  arthritis as needed for shoulder pain. Okay to apply Bengay or IcyHot for local relief.  Please continue to take medications as prescribed.  Please continue to follow low salt diet and ambulate as tolerated.  Please consider getting Tdap vaccine at local pharmacy.

## 2024-01-25 NOTE — Assessment & Plan Note (Deleted)
 H/o multiple TIAs On statin, recently placed on Eliquis due to PE (was on Plavix) F/u with Neurology

## 2024-01-29 ENCOUNTER — Other Ambulatory Visit (HOSPITAL_COMMUNITY): Payer: Self-pay | Admitting: Nephrology

## 2024-01-29 DIAGNOSIS — N1832 Chronic kidney disease, stage 3b: Secondary | ICD-10-CM

## 2024-01-29 DIAGNOSIS — E1122 Type 2 diabetes mellitus with diabetic chronic kidney disease: Secondary | ICD-10-CM

## 2024-02-12 ENCOUNTER — Ambulatory Visit (HOSPITAL_COMMUNITY)
Admission: RE | Admit: 2024-02-12 | Discharge: 2024-02-12 | Disposition: A | Source: Ambulatory Visit | Attending: Nephrology | Admitting: Nephrology

## 2024-02-12 DIAGNOSIS — N1832 Chronic kidney disease, stage 3b: Secondary | ICD-10-CM | POA: Insufficient documentation

## 2024-02-12 DIAGNOSIS — E1122 Type 2 diabetes mellitus with diabetic chronic kidney disease: Secondary | ICD-10-CM | POA: Diagnosis present

## 2024-02-16 NOTE — H&P (Signed)
 Surgical History & Physical  Patient Name: [name]  DOB: Dec 08, 1934  Surgery: Cataract extraction with intraocular lens implant phacoemulsification; Left Eye Surgeon: Marsa Cleverly MD Surgery Date: 02/23/2024 Pre-Op Date: 01/30/2024 HPI: A 89 Yr. old female patient 1. The patient complains of difficulty when reading fine print, books, newspaper, instructions etc., which began 1 year ago. Both eyes are affected. The episode is gradual. The patient describes foggy and hazy symptoms affecting their eyes/vision. HPI was performed by Marsa Cleverly .  Medical History: Dry Eyes Cataracts  Arthritis Heart Problem High Blood Pressure Stroke Thyroid  Problems  Review of Systems Cardiovascular High Blood Pressure, Pacemaker/defibrillator Endocrine Hypothyroidism Musculoskeletal arthritis Neurological Stroke All recorded systems are negative except as noted above.  Social Never smoked  Medication Prednisolone-moxiflox-bromfen,  Gabapentin , Diltiazem  HCl (Cardizem  CD), Metoprolol  succinate, Chlorthalidone, Pantoprazole , Ezetimibe , Topiramate , Rosuvastatin , Eliquis   Sx/Procedures Hysterectomy, Thyroid  sx, Colon sx  Drug Allergies  adhesive tape   History & Physical: Heent: cataracts NECK: supple without bruits LUNGS: lungs clear to auscultation CV: regular rate and rhythm Abdomen: soft and non-tender  Impression & Plan: Assessment: 1.  CATARACT NUCLEAR SCLEROSIS AGE RELATED; Both Eyes (H25.13) 2.  KERATOCONJUNCTIVITIS SICCA NOT SPECIFIED AS SJORGRENS; Both Eyes (H16.223) 3.  ARCUS SENILIS; Both Eyes (H18.413) 4.  Central Retinal Vein Occlusion (CRVO); Left Eye stable (H34.8122) 5.  HYPERTENSIVE RETINOPATHY; Both Eyes (H35.033)  Plan: 1.  Cataracts are visually significant and account for the patient's complaints. Discussed all risks, benefits, procedures and recovery, including infection, loss of vision and eye, need for glasses after surgery or additional  procedures. Patient understands changing glasses will not improve vision. Patient indicated understanding of procedure. All questions answered. Patient desires to have surgery, recommend phacoemulsification with intraocular lens. Patient to have preliminary testing necessary ([employer]/IOL Master, Mac OCT, TOPO) Educational materials provided:Cataract.  Plan: - Proceed with cataract surgery OS followed by OD - Plan for best distance target with DIB00 - No DM, no fuchs, no prior eye surgery - good dilation - Dextenza if available - has had injections OS for central edema with Dr. Will  2.  Dry eye. Mild signs of dry eye at this time. Can use artificial tears QID OU PRN and warm compresses once daily as needed.  3.  Monitor  4.  Injections with Dr. Will [facility]  5.  OS>OD with central CWS's. Has been receiving injections by Dr. Will for this. Compact centrally. Next exam with Dr. Will January 6th.

## 2024-02-20 ENCOUNTER — Encounter (HOSPITAL_COMMUNITY): Payer: Self-pay

## 2024-02-20 ENCOUNTER — Encounter (HOSPITAL_COMMUNITY)
Admission: RE | Admit: 2024-02-20 | Discharge: 2024-02-20 | Disposition: A | Source: Ambulatory Visit | Attending: Optometry | Admitting: Optometry

## 2024-02-20 HISTORY — DX: Prediabetes: R73.03

## 2024-02-23 ENCOUNTER — Ambulatory Visit (HOSPITAL_COMMUNITY): Admitting: Anesthesiology

## 2024-02-23 ENCOUNTER — Ambulatory Visit (HOSPITAL_COMMUNITY): Admission: RE | Admit: 2024-02-23 | Discharge: 2024-02-23 | Disposition: A | Attending: Optometry | Admitting: Optometry

## 2024-02-23 ENCOUNTER — Encounter (HOSPITAL_COMMUNITY): Payer: Self-pay | Admitting: Optometry

## 2024-02-23 ENCOUNTER — Encounter (HOSPITAL_COMMUNITY)
Admission: RE | Disposition: A | Discharge status: Home / Self Care | Payer: Self-pay | Source: Home / Self Care | Attending: Optometry

## 2024-02-23 DIAGNOSIS — Z8673 Personal history of transient ischemic attack (TIA), and cerebral infarction without residual deficits: Secondary | ICD-10-CM | POA: Diagnosis not present

## 2024-02-23 DIAGNOSIS — I129 Hypertensive chronic kidney disease with stage 1 through stage 4 chronic kidney disease, or unspecified chronic kidney disease: Secondary | ICD-10-CM | POA: Diagnosis not present

## 2024-02-23 DIAGNOSIS — H2512 Age-related nuclear cataract, left eye: Secondary | ICD-10-CM | POA: Diagnosis present

## 2024-02-23 DIAGNOSIS — N184 Chronic kidney disease, stage 4 (severe): Secondary | ICD-10-CM | POA: Diagnosis not present

## 2024-02-23 DIAGNOSIS — H5712 Ocular pain, left eye: Secondary | ICD-10-CM | POA: Diagnosis present

## 2024-02-23 DIAGNOSIS — I10 Essential (primary) hypertension: Secondary | ICD-10-CM | POA: Diagnosis not present

## 2024-02-23 DIAGNOSIS — E039 Hypothyroidism, unspecified: Secondary | ICD-10-CM | POA: Diagnosis not present

## 2024-02-23 LAB — GLUCOSE, CAPILLARY: Glucose-Capillary: 81 mg/dL (ref 70–99)

## 2024-02-23 SURGERY — PHACOEMULSIFICATION, CATARACT, WITH IOL INSERTION
Anesthesia: Monitor Anesthesia Care | Site: Eye | Laterality: Right

## 2024-02-23 MED ORDER — LIDOCAINE HCL 3.5 % OP GEL
1.0000 | Freq: Once | OPHTHALMIC | Status: AC
  Administered 2024-02-23: 1 via OPHTHALMIC

## 2024-02-23 MED ORDER — BSS IO SOLN
INTRAOCULAR | Status: DC | PRN
  Administered 2024-02-23: 15 mL via INTRAOCULAR

## 2024-02-23 MED ORDER — DEXAMETHASONE 0.4 MG OP INST
VAGINAL_INSERT | OPHTHALMIC | Status: AC
  Filled 2024-02-23: qty 1

## 2024-02-23 MED ORDER — MOXIFLOXACIN HCL 5 MG/ML IO SOLN
INTRAOCULAR | Status: DC | PRN
  Administered 2024-02-23: .3 mL via INTRACAMERAL

## 2024-02-23 MED ORDER — SIGHTPATH DOSE#1 NA HYALUR & NA CHOND-NA HYALUR IO KIT
PACK | INTRAOCULAR | Status: DC | PRN
  Administered 2024-02-23: 1 via OPHTHALMIC

## 2024-02-23 MED ORDER — TETRACAINE HCL 0.5 % OP SOLN
1.0000 [drp] | OPHTHALMIC | Status: AC | PRN
  Administered 2024-02-23 (×3): 1 [drp] via OPHTHALMIC

## 2024-02-23 MED ORDER — PHENYLEPHRINE HCL 2.5 % OP SOLN
1.0000 [drp] | OPHTHALMIC | Status: AC | PRN
  Administered 2024-02-23 (×3): 1 [drp] via OPHTHALMIC

## 2024-02-23 MED ORDER — DEXAMETHASONE 0.4 MG OP INST
VAGINAL_INSERT | OPHTHALMIC | Status: DC | PRN
  Administered 2024-02-23: .4 mg via OPHTHALMIC

## 2024-02-23 MED ORDER — LIDOCAINE HCL (PF) 1 % IJ SOLN
INTRAMUSCULAR | Status: DC | PRN
  Administered 2024-02-23: 1 mL

## 2024-02-23 MED ORDER — LACTATED RINGERS IV SOLN: INTRAVENOUS | Status: DC

## 2024-02-23 MED ORDER — STERILE WATER FOR IRRIGATION IR SOLN
Status: DC | PRN
  Administered 2024-02-23: 1

## 2024-02-23 MED ORDER — TROPICAMIDE 1 % OP SOLN
1.0000 [drp] | OPHTHALMIC | Status: AC | PRN
  Administered 2024-02-23 (×3): 1 [drp] via OPHTHALMIC

## 2024-02-23 MED ORDER — POVIDONE-IODINE 5 % OP SOLN
OPHTHALMIC | Status: DC | PRN
  Administered 2024-02-23: 1 via OPHTHALMIC

## 2024-02-23 MED ORDER — BSS IO SOLN
INTRAOCULAR | Status: DC | PRN
  Administered 2024-02-23: 500 mL via OPHTHALMIC

## 2024-02-23 SURGICAL SUPPLY — 11 items
CLOTH BEACON ORANGE TIMEOUT ST (SAFETY) ×1 IMPLANT
DRSG TEGADERM 4X4.75 (GAUZE/BANDAGES/DRESSINGS) ×1 IMPLANT
EYE SHIELD UNIVERSAL CLEAR (GAUZE/BANDAGES/DRESSINGS) IMPLANT
FEE CATARACT SUITE SIGHTPATH (MISCELLANEOUS) ×1 IMPLANT
GLOVE BIOGEL PI IND STRL 7.0 (GLOVE) ×2 IMPLANT
LENS IOL TECNIS EYHANCE 24.0 (Intraocular Lens) IMPLANT
NDL HYPO 18GX1.5 BLUNT FILL (NEEDLE) ×1 IMPLANT
PAD ARMBOARD POSITIONER FOAM (MISCELLANEOUS) ×1 IMPLANT
SYR TB 1ML LL NO SAFETY (SYRINGE) ×1 IMPLANT
TAPE PAPER 2X10 WHT MICROPORE (GAUZE/BANDAGES/DRESSINGS) IMPLANT
WATER STERILE IRR 250ML POUR (IV SOLUTION) ×1 IMPLANT

## 2024-02-23 NOTE — Transfer of Care (Signed)
 Immediate Anesthesia Transfer of Care Note  Patient: [name]  Procedure(s) Performed: PHACOEMULSIFICATION, CATARACT, WITH IOL INSERTION (Left: Eye) INSERTION, STENT, DRUG-ELUTING, LACRIMAL CANALICULUS (Left: Eye)  Patient Location: Short Stay  Anesthesia Type:MAC  Level of Consciousness: awake  Airway & Oxygen  Therapy: Patient Spontanous Breathing  Post-op Assessment: Report given to RN  Post vital signs: Reviewed and stable  Last Vitals:  Vitals Value Taken Time  BP    Temp    Pulse    Resp    SpO2      Last Pain:  Vitals:   02/23/24 0810  PainSc: 0-No pain         Complications: No notable events documented.

## 2024-02-23 NOTE — Anesthesia Preprocedure Evaluation (Signed)
 Anesthesia Evaluation  Patient identified by MRN, date of birth, ID band Patient awake    Reviewed: Allergy & Precautions, H&P , NPO status , Patient's Chart, lab work & pertinent test results, reviewed documented beta blocker date and time   Airway Mallampati: II  TM Distance: >3 FB Neck ROM: full    Dental no notable dental hx.    Pulmonary neg pulmonary ROS   Pulmonary exam normal breath sounds clear to auscultation       Cardiovascular Exercise Tolerance: Good hypertension,  Rhythm:regular Rate:Normal     Neuro/Psych  Headaches, Seizures -,  TIA Neuromuscular disease CVA  negative psych ROS   GI/Hepatic negative GI ROS, Neg liver ROS,,,  Endo/Other  Hypothyroidism    Renal/GU Renal disease  negative genitourinary   Musculoskeletal   Abdominal   Peds  Hematology negative hematology ROS (+)   Anesthesia Other Findings   Reproductive/Obstetrics negative OB ROS                              Anesthesia Physical Anesthesia Plan  ASA: 3  Anesthesia Plan: MAC   Post-op Pain Management:    Induction:   PONV Risk Score and Plan:   Airway Management Planned:   Additional Equipment:   Intra-op Plan:   Post-operative Plan:   Informed Consent: I have reviewed the patients History and Physical, chart, labs and discussed the procedure including the risks, benefits and alternatives for the proposed anesthesia with the patient or authorized representative who has indicated his/her understanding and acceptance.     Dental Advisory Given  Plan Discussed with: CRNA  Anesthesia Plan Comments:         Anesthesia Quick Evaluation

## 2024-02-23 NOTE — Interval H&P Note (Signed)
 History and Physical Interval Note:  02/23/2024 8:05 AM  [name]  has presented today for surgery, with the diagnosis of nuclear sclerotic cataract, left eye.  The various methods of treatment have been discussed with the patient and family. After consideration of risks, benefits and other options for treatment, the patient has consented to  Procedures: PHACOEMULSIFICATION, CATARACT, WITH IOL INSERTION (Left) INSERTION, STENT, DRUG-ELUTING, LACRIMAL CANALICULUS (Left) as a surgical intervention.  The patient's history has been reviewed, patient examined, no change in status, stable for surgery.  I have reviewed the patient's chart and labs.  Questions were answered to the patient's satisfaction.     Eveleen Mcnear

## 2024-02-23 NOTE — Discharge Instructions (Signed)
 Please discharge patient when stable, will follow up today with Dr. Ilsa Iha at the [facility] office immediately following discharge.  Leave shield in place until visit.  All paperwork with discharge instructions will be given at the office.  [facility] Address:  [address]  [place], [place] [address]  Dr. Chaya Jan Phone: [phone]

## 2024-02-23 NOTE — Anesthesia Postprocedure Evaluation (Signed)
 Anesthesia Post Note  Patient: [name]  Procedure(s) Performed: PHACOEMULSIFICATION, CATARACT, WITH IOL INSERTION (Left: Eye) INSERTION, STENT, DRUG-ELUTING, LACRIMAL CANALICULUS (Left: Eye)  Patient location during evaluation: Phase II Anesthesia Type: MAC Level of consciousness: awake Pain management: pain level controlled Vital Signs Assessment: post-procedure vital signs reviewed and stable Respiratory status: spontaneous breathing and respiratory function stable Cardiovascular status: blood pressure returned to baseline and stable Postop Assessment: no headache and no apparent nausea or vomiting Anesthetic complications: no Comments: Late entry   No notable events documented.   Last Vitals:  Vitals:   02/23/24 0810 02/23/24 0904  BP: (!) 161/68 (!) 157/110  Pulse:  (!) 58  Resp: 14 15  Temp: 37 C 36.7 C  SpO2: 99% 100%    Last Pain:  Vitals:   02/23/24 0904  TempSrc: Oral  PainSc: 0-No pain                 Yvonna JINNY Bosworth

## 2024-02-23 NOTE — Op Note (Signed)
 Date of procedure: 02/23/2024  Pre-operative diagnosis: Visually significant age-related nuclear cataract, Left Eye (H25.12)  Post-operative diagnosis: Visually significant age-related nuclear cataract, Left Eye H25.12; Ocular Pain and Inflammation, Left eye H57.12  Procedure: Removal of cataract via phacoemulsification and insertion of intra-ocular lens J&J DIB00 +24.0D into the capsular bag of the Left Eye, Dextenza Implantation into left lower punctum CPT [id number]  Attending surgeon: Marsa JINNY Cleverly, MD  Anesthesia: MAC, Topical Akten  Complications: None  Estimated Blood Loss: <40mL (minimal)  Specimens: None  Implants:  Implant Name Type Inv. Item Serial No. Manufacturer Lot No. LRB No. Used Action  LENS IOL TECNIS EYHANCE 24.0 - [id number] Intraocular Lens LENS IOL TECNIS EYHANCE 24.0 [id number] SIGHTPATH  Left 1 Implanted    Indications:  Visually significant age-related cataract, Left Eye  Procedure:  The patient was seen and identified in the pre-operative area. The operative eye was identified and dilated.  The operative eye was marked.  Topical anesthesia was administered to the operative eye.     The patient was then to the operative suite and placed in the supine position.  A timeout was performed confirming the patient, procedure to be performed, and all other relevant information.   The patient's face was prepped and draped in the usual fashion for intra-ocular surgery.  A lid speculum was placed into the operative eye and the surgical microscope moved into place and focused.  An inferotemporal paracentesis was created using a 20 gauge paracentesis blade.  BSS mixed with Omidria, followed by 1% lidocaine  was injected into the anterior chamber.  Viscoelastic was injected into the anterior chamber.  A temporal clear-corneal main wound incision was created using a 2.64mm microkeratome.  A continuous curvilinear capsulorrhexis was initiated using an irrigating cystitome and  completed using capsulorrhexis forceps.  Hydrodissection and hydrodeliniation were performed.  Viscoelastic was injected into the anterior chamber.  A phacoemulsification handpiece and a chopper as a second instrument were used to remove the nucleus and epinucleus. The irrigation/aspiration handpiece was used to remove any remaining cortical material.   The capsular bag was reinflated with viscoelastic, checked, and found to be intact.  The intraocular lens was inserted into the capsular bag.  The irrigation/aspiration handpiece was used to remove any remaining viscoelastic.  The clear corneal wound and paracentesis wounds were then hydrated and checked with Weck-Cels to be watertight. Moxifloxacin was instilled into the anterior chamber.  The lid-speculum and drape were removed. The lower punctum was dilated, and the dextenza implant was inserted into it. The patient's face was cleaned with a wet and dry 4x4.  A clear shield was taped over the eye. The patient was taken to the post-operative care unit in good condition, having tolerated the procedure well.  Post-Op Instructions: The patient will follow up at [facility] for a same day post-operative evaluation and will receive all other orders and instructions.

## 2024-02-26 ENCOUNTER — Encounter (HOSPITAL_COMMUNITY): Payer: Self-pay | Admitting: Optometry

## 2024-03-04 NOTE — H&P (Signed)
 Surgical History & Physical  Patient Name: Eadie Repetto  DOB: Sep 02, 1934  Surgery: Cataract extraction with intraocular lens implant phacoemulsification; Right Eye Surgeon: Marsa Cleverly MD Surgery Date: 03/12/2024 Pre-Op Date: 02/27/2024  HPI: A 64 Yr. old female patient 1. The patient is returning after cataract surgery. The left eye is affected. Status post cataract surgery, which began 4 days ago: Since the last visit, the affected area is doing well. The patient's vision is improved. The condition's severity is constant. Patient is following medication instructions. 2. The patient is returning for a cataract follow-up of the right eye. Since the last visit, the affected area is tolerating. The patient's vision is blurry. The condition's severity is constant. Patient is not taking medications. This is negatively affecting the patient's quality of life and the patient is unable to function adequately in life with the current level of vision.  Medical History: Dry Eyes Cataracts  Arthritis Heart Problem High Blood Pressure Stroke Thyroid  Problems  Review of Systems Cardiovascular High Blood Pressure, Pacemaker/defibrillator Endocrine Hypothyroidism Musculoskeletal arthritis Neurological Stroke All recorded systems are negative except as noted above.  Social Never smoked   Medication Prednisolone-moxiflox-bromfen,  Gabapentin , Diltiazem  HCl (Cardizem  CD), Metoprolol  succinate, Chlorthalidone, Pantoprazole , Ezetimibe , Topiramate , Rosuvastatin , Eliquis   Sx/Procedures Phaco c IOL OS-dex, -,  Hysterectomy, Thyroid  sx, Colon sx  Drug Allergies  adhesive tape   History & Physical: Heent: cataract NECK: supple without bruits LUNGS: lungs clear to auscultation CV: regular rate and rhythm Abdomen: soft and non-tender  Impression & Plan: Assessment: 1.  CATARACT NUCLEAR SCLEROSIS AGE RELATED; Both Eyes (H25.13) 2.  CATARACT EXTRACTION STATUS; Left Eye  (Z98.42) 3.  INTRAOCULAR LENS IOL (Z96.1)  Plan: 1.  Cataracts are visually significant and account for the patient's complaints. Discussed all risks, benefits, procedures and recovery, including infection, loss of vision and eye, need for glasses after surgery or additional procedures. Patient understands changing glasses will not improve vision. Patient indicated understanding of procedure. All questions answered. Patient desires to have surgery, recommend phacoemulsification with intraocular lens. Patient to have preliminary testing necessary ([employer]/IOL Master, Mac OCT, TOPO) Educational materials provided:Cataract.  Plan: - Proceed with cataract surgery OD when ready - Plan for best distance target with DIB00 - No DM, no fuchs, no prior eye surgery - good dilation - Dextenza  if available - has had injections OS for central edema with Dr. Will  2.  CE/PCIOL OS 02/23/24.  POD 4 Doing well. All post-op precautions discussed and instructions reviewed. Written instructions given.  3.  CE/PCIOL OS 02/23/24.  POD 4 Doing well. All post-op precautions discussed and instructions reviewed. Written instructions given.

## 2024-03-08 ENCOUNTER — Encounter (HOSPITAL_COMMUNITY)
Admission: RE | Admit: 2024-03-08 | Discharge: 2024-03-08 | Disposition: A | Discharge status: Home / Self Care | Source: Ambulatory Visit | Attending: Optometry | Admitting: Optometry

## 2024-03-08 ENCOUNTER — Encounter (HOSPITAL_COMMUNITY): Payer: Self-pay

## 2024-03-12 ENCOUNTER — Ambulatory Visit (HOSPITAL_COMMUNITY)
Admission: RE | Admit: 2024-03-12 | Discharge: 2024-03-12 | Disposition: A | Discharge status: Home / Self Care | Attending: Optometry | Admitting: Optometry

## 2024-03-12 ENCOUNTER — Encounter (HOSPITAL_COMMUNITY)
Admission: RE | Disposition: A | Discharge status: Home / Self Care | Payer: Self-pay | Source: Home / Self Care | Attending: Optometry

## 2024-03-12 ENCOUNTER — Ambulatory Visit (HOSPITAL_COMMUNITY)

## 2024-03-12 ENCOUNTER — Other Ambulatory Visit: Payer: Self-pay | Admitting: Cardiology

## 2024-03-12 ENCOUNTER — Encounter (HOSPITAL_COMMUNITY): Payer: Self-pay | Admitting: Optometry

## 2024-03-12 DIAGNOSIS — H2511 Age-related nuclear cataract, right eye: Secondary | ICD-10-CM | POA: Diagnosis present

## 2024-03-12 DIAGNOSIS — E782 Mixed hyperlipidemia: Secondary | ICD-10-CM | POA: Diagnosis not present

## 2024-03-12 DIAGNOSIS — E039 Hypothyroidism, unspecified: Secondary | ICD-10-CM | POA: Insufficient documentation

## 2024-03-12 DIAGNOSIS — G709 Myoneural disorder, unspecified: Secondary | ICD-10-CM | POA: Insufficient documentation

## 2024-03-12 DIAGNOSIS — I129 Hypertensive chronic kidney disease with stage 1 through stage 4 chronic kidney disease, or unspecified chronic kidney disease: Secondary | ICD-10-CM | POA: Diagnosis not present

## 2024-03-12 DIAGNOSIS — Z9842 Cataract extraction status, left eye: Secondary | ICD-10-CM | POA: Insufficient documentation

## 2024-03-12 DIAGNOSIS — Z961 Presence of intraocular lens: Secondary | ICD-10-CM | POA: Insufficient documentation

## 2024-03-12 DIAGNOSIS — Z8673 Personal history of transient ischemic attack (TIA), and cerebral infarction without residual deficits: Secondary | ICD-10-CM | POA: Insufficient documentation

## 2024-03-12 DIAGNOSIS — N184 Chronic kidney disease, stage 4 (severe): Secondary | ICD-10-CM | POA: Diagnosis not present

## 2024-03-12 DIAGNOSIS — M1990 Unspecified osteoarthritis, unspecified site: Secondary | ICD-10-CM | POA: Diagnosis not present

## 2024-03-12 DIAGNOSIS — H5711 Ocular pain, right eye: Secondary | ICD-10-CM

## 2024-03-12 HISTORY — PX: INSERTION, STENT, DRUG-ELUTING, LACRIMAL CANALICULUS: SHX7453

## 2024-03-12 HISTORY — PX: CATARACT EXTRACTION W/PHACO: SHX586

## 2024-03-12 SURGERY — PHACOEMULSIFICATION, CATARACT, WITH IOL INSERTION
Anesthesia: Monitor Anesthesia Care | Site: Eye | Laterality: Right

## 2024-03-12 MED ORDER — TROPICAMIDE 1 % OP SOLN
1.0000 [drp] | OPHTHALMIC | Status: AC
  Administered 2024-03-12 (×3): 1 [drp] via OPHTHALMIC

## 2024-03-12 MED ORDER — LIDOCAINE HCL (PF) 1 % IJ SOLN
INTRAMUSCULAR | Status: DC | PRN
  Administered 2024-03-12: 2 mL

## 2024-03-12 MED ORDER — BSS IO SOLN
INTRAOCULAR | Status: DC | PRN
  Administered 2024-03-12: 15 mL via INTRAOCULAR

## 2024-03-12 MED ORDER — MOXIFLOXACIN HCL 5 MG/ML IO SOLN
INTRAOCULAR | Status: DC | PRN
  Administered 2024-03-12: .2 mL via OPHTHALMIC

## 2024-03-12 MED ORDER — STERILE WATER FOR IRRIGATION IR SOLN
Status: DC | PRN
  Administered 2024-03-12: 250 mL

## 2024-03-12 MED ORDER — PHENYLEPHRINE HCL 2.5 % OP SOLN
1.0000 [drp] | OPHTHALMIC | Status: AC
  Administered 2024-03-12 (×3): 1 [drp] via OPHTHALMIC

## 2024-03-12 MED ORDER — DEXAMETHASONE 0.4 MG OP INST
VAGINAL_INSERT | OPHTHALMIC | Status: DC | PRN
  Administered 2024-03-12: .4 mg via OPHTHALMIC

## 2024-03-12 MED ORDER — PHENYLEPHRINE-KETOROLAC 1-0.3 % IO SOLN
INTRAOCULAR | Status: DC | PRN
  Administered 2024-03-12: 500 mL via OPHTHALMIC

## 2024-03-12 MED ORDER — LIDOCAINE HCL 3.5 % OP GEL: 1.0000 | Freq: Once | OPHTHALMIC | Status: DC

## 2024-03-12 MED ORDER — SODIUM CHLORIDE 0.9 % IV SOLN: INTRAVENOUS | Status: DC

## 2024-03-12 MED ORDER — SIGHTPATH DOSE#1 NA HYALUR & NA CHOND-NA HYALUR IO KIT
PACK | INTRAOCULAR | Status: DC | PRN
  Administered 2024-03-12: 1 via OPHTHALMIC

## 2024-03-12 MED ORDER — DEXAMETHASONE 0.4 MG OP INST
VAGINAL_INSERT | OPHTHALMIC | Status: AC
  Filled 2024-03-12: qty 1

## 2024-03-12 MED ORDER — TETRACAINE HCL 0.5 % OP SOLN
1.0000 [drp] | OPHTHALMIC | Status: AC
  Administered 2024-03-12 (×3): 1 [drp] via OPHTHALMIC

## 2024-03-12 MED ORDER — POVIDONE-IODINE 5 % OP SOLN
OPHTHALMIC | Status: DC | PRN
  Administered 2024-03-12: 1 via OPHTHALMIC

## 2024-03-12 SURGICAL SUPPLY — 11 items
CLOTH BEACON ORANGE TIMEOUT ST (SAFETY) ×1 IMPLANT
DRSG TEGADERM 4X4.75 (GAUZE/BANDAGES/DRESSINGS) ×1 IMPLANT
EYE SHIELD UNIVERSAL CLEAR (GAUZE/BANDAGES/DRESSINGS) IMPLANT
FEE CATARACT SUITE SIGHTPATH (MISCELLANEOUS) ×1 IMPLANT
GLOVE BIOGEL PI IND STRL 7.0 (GLOVE) ×2 IMPLANT
LENS IOL TECNIS EYHANCE 24.0 (Intraocular Lens) IMPLANT
NEEDLE HYPO 18GX1.5 BLUNT FILL (NEEDLE) ×1 IMPLANT
PAD ARMBOARD POSITIONER FOAM (MISCELLANEOUS) ×1 IMPLANT
SYR TB 1ML LL NO SAFETY (SYRINGE) ×1 IMPLANT
TAPE PAPER 2X10 WHT MICROPORE (GAUZE/BANDAGES/DRESSINGS) IMPLANT
WATER STERILE IRR 250ML POUR (IV SOLUTION) ×1 IMPLANT

## 2024-03-12 NOTE — Interval H&P Note (Signed)
 History and Physical Interval Note:  03/12/2024 8:31 AM  [name]  has presented today for surgery, with the diagnosis of nuclear sclerotic cataract, right eye.  The various methods of treatment have been discussed with the patient and family. After consideration of risks, benefits and other options for treatment, the patient has consented to  Procedures: PHACOEMULSIFICATION, CATARACT, WITH IOL INSERTION (Right) INSERTION, STENT, DRUG-ELUTING, LACRIMAL CANALICULUS (Right) as a surgical intervention.  The patient's history has been reviewed, patient examined, no change in status, stable for surgery.  I have reviewed the patient's chart and labs.  Questions were answered to the patient's satisfaction.    The H and P was reviewed and updated. The patient was examined.  No changes were found after exam.  The surgical eye was marked.   Sherle Mello

## 2024-03-12 NOTE — Discharge Instructions (Signed)
 Please discharge patient when stable, will follow up today with Dr. Ilsa Iha at the [facility] office immediately following discharge.  Leave shield in place until visit.  All paperwork with discharge instructions will be given at the office.  [facility] Address:  [address]  [place], [place] [address]  Dr. Chaya Jan Phone: [phone]

## 2024-03-12 NOTE — Anesthesia Postprocedure Evaluation (Signed)
"   Anesthesia Post Note  Patient: JALEXUS BRETT  Procedure(s) Performed: PHACOEMULSIFICATION, CATARACT, WITH IOL INSERTION (Right: Eye) INSERTION, STENT, DRUG-ELUTING, LACRIMAL CANALICULUS (Right: Eye)  Patient location during evaluation: PACU Anesthesia Type: MAC Level of consciousness: awake and alert Pain management: pain level controlled Vital Signs Assessment: post-procedure vital signs reviewed and stable Respiratory status: spontaneous breathing, nonlabored ventilation, respiratory function stable and patient connected to nasal cannula oxygen  Cardiovascular status: stable and blood pressure returned to baseline Postop Assessment: no apparent nausea or vomiting Anesthetic complications: no   No notable events documented.   Last Vitals:  Vitals:   03/12/24 0802 03/12/24 0936  BP: (!) 149/72 (!) 155/71  Pulse: (!) 57 60  Temp: 36.7 C 36.6 C  SpO2: 100% 100%    Last Pain:  Vitals:   03/12/24 0936  TempSrc: Oral  PainSc: 0-No pain                 Andrea Limes      "

## 2024-03-12 NOTE — Anesthesia Preprocedure Evaluation (Addendum)
"                                    Anesthesia Evaluation  Patient identified by MRN, date of birth, ID band Patient awake    Reviewed: Allergy & Precautions, H&P , NPO status , Patient's Chart, lab work & pertinent test results  Airway Mallampati: II  TM Distance: >3 FB Neck ROM: Full    Dental no notable dental hx. (+) Upper Dentures   Pulmonary neg pulmonary ROS   Pulmonary exam normal breath sounds clear to auscultation       Cardiovascular hypertension, Normal cardiovascular exam Rhythm:Regular Rate:Normal     Neuro/Psych  Headaches, Seizures -,  TIA Neuromuscular disease CVA  negative psych ROS   GI/Hepatic negative GI ROS, Neg liver ROS,,,  Endo/Other  Hypothyroidism    Renal/GU Renal InsufficiencyRenal diseaseStage 4  negative genitourinary   Musculoskeletal  (+) Arthritis ,    Abdominal   Peds negative pediatric ROS (+)  Hematology negative hematology ROS (+)   Anesthesia Other Findings   Reproductive/Obstetrics negative OB ROS                              Anesthesia Physical Anesthesia Plan  ASA: 3  Anesthesia Plan: MAC   Post-op Pain Management:    Induction:   PONV Risk Score and Plan:   Airway Management Planned: Nasal Cannula  Additional Equipment:   Intra-op Plan:   Post-operative Plan:   Informed Consent: I have reviewed the patients History and Physical, chart, labs and discussed the procedure including the risks, benefits and alternatives for the proposed anesthesia with the patient or authorized representative who has indicated his/her understanding and acceptance.     Dental advisory given  Plan Discussed with: CRNA  Anesthesia Plan Comments:          Anesthesia Quick Evaluation  "

## 2024-03-12 NOTE — Transfer of Care (Signed)
 Immediate Anesthesia Transfer of Care Note  Patient: [name]  Procedure(s) Performed: PHACOEMULSIFICATION, CATARACT, WITH IOL INSERTION (Right: Eye) INSERTION, STENT, DRUG-ELUTING, LACRIMAL CANALICULUS (Right: Eye)  Patient Location: Short Stay  Anesthesia Type:MAC  Level of Consciousness: awake  Airway & Oxygen  Therapy: Patient Spontanous Breathing  Post-op Assessment: Report given to RN  Post vital signs: Reviewed  Last Vitals:  Vitals Value Taken Time  BP    Temp    Pulse    Resp    SpO2      Last Pain:  Vitals:   03/12/24 0802  TempSrc: Oral  PainSc: 0-No pain      Patients Stated Pain Goal: 5 (03/12/24 0802)  Complications: No notable events documented.

## 2024-03-12 NOTE — Op Note (Signed)
 Date of procedure: 03/12/2024  Pre-operative diagnosis: Visually significant age-related nuclear cataract, Right Eye (H25.11)  Post-operative diagnosis: Visually significant age-related nuclear cataract, Right Eye H25.11; Ocular Pain and Inflammation, Right eye H57.11  Procedure: Removal of cataract via phacoemulsification and insertion of intra-ocular lens J&J DIBOO +24.0D into the capsular bag of the Right Eye, Dextenza  Implantation into right lower punctum CPT [phone]  Attending surgeon: Marsa Cleverly, MD  Anesthesia: MAC, Topical Akten  Complications: None  Estimated Blood Loss: <60mL (minimal)  Specimens: None  Implants:  Implant Name Type Inv. Item Serial No. Manufacturer Lot No. LRB No. Used Action  LENS IOL TECNIS EYHANCE 24.0 - [id number] Intraocular Lens LENS IOL TECNIS EYHANCE 24.0 [id number] [employer]PATH  Right 1 Implanted    Indications:  Visually significant age-related cataract, Right Eye  Procedure:  The patient was seen and identified in the pre-operative area. The operative eye was identified and dilated.  The operative eye was marked.  Topical anesthesia was administered to the operative eye.     The patient was then to the operative suite and placed in the supine position.  A timeout was performed confirming the patient, procedure to be performed, and all other relevant information.   The patient's face was prepped and draped in the usual fashion for intra-ocular surgery.  A lid speculum was placed into the operative eye and the surgical microscope moved into place and focused.  A superotemporal paracentesis was created using a 20 gauge paracentesis blade.  BSS mixed with Omidria , followed by 1% lidocaine  was injected into the anterior chamber.  Viscoelastic was injected into the anterior chamber.  A temporal clear-corneal main wound incision was created using a 2.63mm microkeratome.  A continuous curvilinear capsulorrhexis was initiated using an irrigating cystitome and  completed using capsulorrhexis forceps.  Hydrodissection and hydrodeliniation were performed.  Viscoelastic was injected into the anterior chamber.  A phacoemulsification handpiece and a chopper as a second instrument were used to remove the nucleus and epinucleus. The irrigation/aspiration handpiece was used to remove any remaining cortical material.   The capsular bag was reinflated with viscoelastic, checked, and found to be intact.  The intraocular lens was inserted into the capsular bag.  The irrigation/aspiration handpiece was used to remove any remaining viscoelastic.  The clear corneal wound and paracentesis wounds were then hydrated and checked with Weck-Cels to be watertight. Moxifloxacin  was instilled into the anterior chamber.  The lid-speculum and drape were removed. The lower punctum was dilated, and the dextenza  implant was inserted into it. The patient's face was cleaned with a wet and dry 4x4. A clear shield was taped over the eye. The patient was taken to the post-operative care unit in good condition, having tolerated the procedure well.  Post-Op Instructions: The patient will follow up at [facility] for a same day post-operative evaluation and will receive all other orders and instructions.

## 2024-03-13 ENCOUNTER — Encounter (HOSPITAL_COMMUNITY): Payer: Self-pay | Admitting: Optometry

## 2024-03-19 ENCOUNTER — Encounter (INDEPENDENT_AMBULATORY_CARE_PROVIDER_SITE_OTHER): Admitting: Ophthalmology

## 2024-03-19 DIAGNOSIS — H34831 Tributary (branch) retinal vein occlusion, right eye, with macular edema: Secondary | ICD-10-CM

## 2024-03-19 DIAGNOSIS — H35033 Hypertensive retinopathy, bilateral: Secondary | ICD-10-CM | POA: Diagnosis not present

## 2024-03-19 DIAGNOSIS — H43813 Vitreous degeneration, bilateral: Secondary | ICD-10-CM

## 2024-03-19 DIAGNOSIS — I10 Essential (primary) hypertension: Secondary | ICD-10-CM

## 2024-03-22 ENCOUNTER — Other Ambulatory Visit: Payer: Self-pay | Admitting: Internal Medicine

## 2024-03-26 ENCOUNTER — Other Ambulatory Visit: Payer: Self-pay | Admitting: Cardiology

## 2024-03-30 ENCOUNTER — Other Ambulatory Visit: Payer: Self-pay | Admitting: Neurology

## 2024-04-01 ENCOUNTER — Other Ambulatory Visit: Payer: Self-pay

## 2024-04-18 ENCOUNTER — Encounter: Payer: Self-pay | Admitting: Internal Medicine

## 2024-04-30 ENCOUNTER — Inpatient Hospital Stay

## 2024-05-07 ENCOUNTER — Inpatient Hospital Stay: Admitting: Physician Assistant

## 2024-05-08 ENCOUNTER — Encounter (INDEPENDENT_AMBULATORY_CARE_PROVIDER_SITE_OTHER): Admitting: Ophthalmology

## 2024-05-14 ENCOUNTER — Inpatient Hospital Stay

## 2024-05-21 ENCOUNTER — Inpatient Hospital Stay: Admitting: Physician Assistant

## 2024-06-12 ENCOUNTER — Ambulatory Visit: Admitting: Cardiology

## 2024-06-25 ENCOUNTER — Other Ambulatory Visit

## 2024-07-02 ENCOUNTER — Ambulatory Visit: Admitting: Physician Assistant

## 2024-07-24 ENCOUNTER — Ambulatory Visit: Admitting: Internal Medicine

## 2024-11-21 ENCOUNTER — Ambulatory Visit: Admitting: Neurology

## 2024-12-09 ENCOUNTER — Ambulatory Visit
